# Patient Record
Sex: Female | Born: 1955 | Race: Black or African American | Hispanic: No | Marital: Single | Smoking: Current every day smoker
Health system: Southern US, Community
[De-identification: ages and names within clinical notes are randomized; demographics above are authoritative.]

## PROBLEM LIST (undated history)

## (undated) DIAGNOSIS — R3915 Urgency of urination: Secondary | ICD-10-CM

## (undated) DIAGNOSIS — M2540 Effusion, unspecified joint: Secondary | ICD-10-CM

## (undated) DIAGNOSIS — R531 Weakness: Secondary | ICD-10-CM

## (undated) DIAGNOSIS — J45909 Unspecified asthma, uncomplicated: Secondary | ICD-10-CM

## (undated) DIAGNOSIS — IMO0001 Reserved for inherently not codable concepts without codable children: Secondary | ICD-10-CM

## (undated) DIAGNOSIS — J189 Pneumonia, unspecified organism: Secondary | ICD-10-CM

## (undated) DIAGNOSIS — D649 Anemia, unspecified: Secondary | ICD-10-CM

## (undated) DIAGNOSIS — J449 Chronic obstructive pulmonary disease, unspecified: Secondary | ICD-10-CM

## (undated) DIAGNOSIS — M2550 Pain in unspecified joint: Secondary | ICD-10-CM

## (undated) DIAGNOSIS — R600 Localized edema: Secondary | ICD-10-CM

## (undated) DIAGNOSIS — R350 Frequency of micturition: Secondary | ICD-10-CM

## (undated) DIAGNOSIS — Z860100 Personal history of colon polyps, unspecified: Secondary | ICD-10-CM

## (undated) DIAGNOSIS — K219 Gastro-esophageal reflux disease without esophagitis: Secondary | ICD-10-CM

## (undated) DIAGNOSIS — D473 Essential (hemorrhagic) thrombocythemia: Secondary | ICD-10-CM

## (undated) DIAGNOSIS — M542 Cervicalgia: Secondary | ICD-10-CM

## (undated) DIAGNOSIS — I509 Heart failure, unspecified: Secondary | ICD-10-CM

## (undated) DIAGNOSIS — R918 Other nonspecific abnormal finding of lung field: Secondary | ICD-10-CM

## (undated) DIAGNOSIS — F32A Depression, unspecified: Secondary | ICD-10-CM

## (undated) DIAGNOSIS — D509 Iron deficiency anemia, unspecified: Secondary | ICD-10-CM

## (undated) DIAGNOSIS — D72829 Elevated white blood cell count, unspecified: Secondary | ICD-10-CM

## (undated) DIAGNOSIS — R609 Edema, unspecified: Secondary | ICD-10-CM

## (undated) DIAGNOSIS — F329 Major depressive disorder, single episode, unspecified: Secondary | ICD-10-CM

## (undated) DIAGNOSIS — I10 Essential (primary) hypertension: Secondary | ICD-10-CM

## (undated) HISTORY — DX: Elevated white blood cell count, unspecified: D72.829

## (undated) HISTORY — DX: Essential (hemorrhagic) thrombocythemia: D47.3

## (undated) HISTORY — DX: Iron deficiency anemia, unspecified: D50.9

## (undated) HISTORY — DX: Anemia, unspecified: D64.9

## (undated) HISTORY — PX: TONSILLECTOMY: SUR1361

## (undated) HISTORY — DX: Depression, unspecified: F32.A

## (undated) HISTORY — PX: TOTAL KNEE ARTHROPLASTY: SHX125

## (undated) HISTORY — DX: Chronic obstructive pulmonary disease, unspecified: J44.9

## (undated) HISTORY — PX: COLONOSCOPY: SHX174

## (undated) HISTORY — DX: Essential (primary) hypertension: I10

## (undated) HISTORY — DX: Gastro-esophageal reflux disease without esophagitis: K21.9

## (undated) HISTORY — DX: Other nonspecific abnormal finding of lung field: R91.8

## (undated) HISTORY — DX: Major depressive disorder, single episode, unspecified: F32.9

---

## 1998-12-23 ENCOUNTER — Other Ambulatory Visit: Admission: RE | Admit: 1998-12-23 | Discharge: 1998-12-23 | Payer: Self-pay | Admitting: Family Medicine

## 2001-01-03 ENCOUNTER — Ambulatory Visit (HOSPITAL_COMMUNITY): Admission: RE | Admit: 2001-01-03 | Discharge: 2001-01-03 | Payer: Self-pay | Admitting: Family Medicine

## 2001-01-03 ENCOUNTER — Encounter: Payer: Self-pay | Admitting: Family Medicine

## 2001-05-18 ENCOUNTER — Emergency Department (HOSPITAL_COMMUNITY): Admission: EM | Admit: 2001-05-18 | Discharge: 2001-05-18 | Payer: Self-pay | Admitting: Emergency Medicine

## 2001-06-11 ENCOUNTER — Encounter: Admission: RE | Admit: 2001-06-11 | Discharge: 2001-09-09 | Payer: Self-pay | Admitting: Family Medicine

## 2002-01-05 ENCOUNTER — Encounter: Payer: Self-pay | Admitting: Family Medicine

## 2002-01-05 ENCOUNTER — Ambulatory Visit (HOSPITAL_COMMUNITY): Admission: RE | Admit: 2002-01-05 | Discharge: 2002-01-05 | Payer: Self-pay | Admitting: Family Medicine

## 2003-01-19 ENCOUNTER — Ambulatory Visit (HOSPITAL_COMMUNITY): Admission: RE | Admit: 2003-01-19 | Discharge: 2003-01-19 | Payer: Self-pay | Admitting: Family Medicine

## 2003-06-30 ENCOUNTER — Ambulatory Visit (HOSPITAL_COMMUNITY): Admission: RE | Admit: 2003-06-30 | Discharge: 2003-06-30 | Payer: Self-pay | Admitting: Family Medicine

## 2003-12-03 ENCOUNTER — Ambulatory Visit (HOSPITAL_COMMUNITY): Admission: RE | Admit: 2003-12-03 | Discharge: 2003-12-03 | Payer: Self-pay | Admitting: Orthopedic Surgery

## 2003-12-20 ENCOUNTER — Encounter (HOSPITAL_COMMUNITY): Admission: RE | Admit: 2003-12-20 | Discharge: 2004-01-19 | Payer: Self-pay | Admitting: Orthopedic Surgery

## 2004-01-04 ENCOUNTER — Ambulatory Visit (HOSPITAL_COMMUNITY): Admission: RE | Admit: 2004-01-04 | Discharge: 2004-01-04 | Payer: Self-pay | Admitting: Orthopedic Surgery

## 2004-01-27 ENCOUNTER — Encounter (HOSPITAL_COMMUNITY): Admission: RE | Admit: 2004-01-27 | Discharge: 2004-02-26 | Payer: Self-pay | Admitting: Orthopedic Surgery

## 2004-04-14 ENCOUNTER — Inpatient Hospital Stay (HOSPITAL_COMMUNITY): Admission: EM | Admit: 2004-04-14 | Discharge: 2004-04-17 | Payer: Self-pay | Admitting: Emergency Medicine

## 2005-07-09 ENCOUNTER — Encounter: Admission: RE | Admit: 2005-07-09 | Discharge: 2005-07-09 | Payer: Self-pay | Admitting: Orthopedic Surgery

## 2005-11-01 ENCOUNTER — Encounter: Admission: RE | Admit: 2005-11-01 | Discharge: 2005-11-01 | Payer: Self-pay | Admitting: Orthopedic Surgery

## 2006-01-01 ENCOUNTER — Inpatient Hospital Stay (HOSPITAL_COMMUNITY): Admission: RE | Admit: 2006-01-01 | Discharge: 2006-01-05 | Payer: Self-pay | Admitting: Orthopedic Surgery

## 2006-01-02 ENCOUNTER — Ambulatory Visit: Payer: Self-pay | Admitting: Physical Medicine & Rehabilitation

## 2006-02-04 ENCOUNTER — Encounter (HOSPITAL_COMMUNITY): Admission: RE | Admit: 2006-02-04 | Discharge: 2006-03-06 | Payer: Self-pay | Admitting: Orthopedic Surgery

## 2006-04-16 ENCOUNTER — Inpatient Hospital Stay (HOSPITAL_COMMUNITY): Admission: RE | Admit: 2006-04-16 | Discharge: 2006-04-19 | Payer: Self-pay | Admitting: Orthopedic Surgery

## 2006-05-27 ENCOUNTER — Encounter (HOSPITAL_COMMUNITY): Admission: RE | Admit: 2006-05-27 | Discharge: 2006-06-26 | Payer: Self-pay | Admitting: Orthopedic Surgery

## 2006-06-28 ENCOUNTER — Encounter (HOSPITAL_COMMUNITY): Admission: RE | Admit: 2006-06-28 | Discharge: 2006-07-28 | Payer: Self-pay | Admitting: Orthopedic Surgery

## 2008-12-07 ENCOUNTER — Inpatient Hospital Stay (HOSPITAL_COMMUNITY): Admission: EM | Admit: 2008-12-07 | Discharge: 2008-12-13 | Payer: Self-pay | Admitting: Emergency Medicine

## 2008-12-08 ENCOUNTER — Encounter (INDEPENDENT_AMBULATORY_CARE_PROVIDER_SITE_OTHER): Payer: Self-pay | Admitting: General Surgery

## 2009-12-07 ENCOUNTER — Encounter: Payer: Self-pay | Admitting: Gastroenterology

## 2009-12-20 ENCOUNTER — Ambulatory Visit: Payer: Self-pay | Admitting: Gastroenterology

## 2009-12-20 ENCOUNTER — Ambulatory Visit (HOSPITAL_COMMUNITY)
Admission: RE | Admit: 2009-12-20 | Discharge: 2009-12-20 | Payer: Self-pay | Source: Home / Self Care | Admitting: Gastroenterology

## 2010-04-11 NOTE — Letter (Signed)
Summary: TRIAGE ORDER  TRIAGE ORDER   Imported By: Ave Filter 12/07/2009 10:15:28  _____________________________________________________________________  External Attachment:    Type:   Image     Comment:   External Document

## 2010-05-24 LAB — GLUCOSE, CAPILLARY: Glucose-Capillary: 110 mg/dL — ABNORMAL HIGH (ref 70–99)

## 2010-06-15 LAB — CBC
HCT: 32.6 % — ABNORMAL LOW (ref 36.0–46.0)
Hemoglobin: 10.5 g/dL — ABNORMAL LOW (ref 12.0–15.0)
MCHC: 32.1 g/dL (ref 30.0–36.0)
MCV: 83.4 fL (ref 78.0–100.0)
Platelets: 544 10*3/uL — ABNORMAL HIGH (ref 150–400)
RBC: 3.92 MIL/uL (ref 3.87–5.11)
RDW: 19.1 % — ABNORMAL HIGH (ref 11.5–15.5)
WBC: 8.6 10*3/uL (ref 4.0–10.5)

## 2010-06-15 LAB — GLUCOSE, CAPILLARY
Glucose-Capillary: 107 mg/dL — ABNORMAL HIGH (ref 70–99)
Glucose-Capillary: 109 mg/dL — ABNORMAL HIGH (ref 70–99)
Glucose-Capillary: 112 mg/dL — ABNORMAL HIGH (ref 70–99)
Glucose-Capillary: 118 mg/dL — ABNORMAL HIGH (ref 70–99)
Glucose-Capillary: 118 mg/dL — ABNORMAL HIGH (ref 70–99)
Glucose-Capillary: 124 mg/dL — ABNORMAL HIGH (ref 70–99)
Glucose-Capillary: 125 mg/dL — ABNORMAL HIGH (ref 70–99)
Glucose-Capillary: 135 mg/dL — ABNORMAL HIGH (ref 70–99)
Glucose-Capillary: 151 mg/dL — ABNORMAL HIGH (ref 70–99)
Glucose-Capillary: 154 mg/dL — ABNORMAL HIGH (ref 70–99)
Glucose-Capillary: 170 mg/dL — ABNORMAL HIGH (ref 70–99)
Glucose-Capillary: 77 mg/dL (ref 70–99)
Glucose-Capillary: 85 mg/dL (ref 70–99)
Glucose-Capillary: 98 mg/dL (ref 70–99)

## 2010-06-15 LAB — DIFFERENTIAL
Basophils Absolute: 0 10*3/uL (ref 0.0–0.1)
Basophils Relative: 0 % (ref 0–1)
Eosinophils Absolute: 0.1 10*3/uL (ref 0.0–0.7)
Eosinophils Relative: 2 % (ref 0–5)
Lymphocytes Relative: 29 % (ref 12–46)
Lymphs Abs: 2.5 10*3/uL (ref 0.7–4.0)
Monocytes Absolute: 0.4 10*3/uL (ref 0.1–1.0)
Monocytes Relative: 5 % (ref 3–12)
Neutro Abs: 5.5 10*3/uL (ref 1.7–7.7)
Neutrophils Relative %: 64 % (ref 43–77)

## 2010-06-15 LAB — BASIC METABOLIC PANEL
BUN: 2 mg/dL — ABNORMAL LOW (ref 6–23)
CO2: 33 mEq/L — ABNORMAL HIGH (ref 19–32)
Calcium: 8.3 mg/dL — ABNORMAL LOW (ref 8.4–10.5)
Chloride: 98 mEq/L (ref 96–112)
Creatinine, Ser: 0.77 mg/dL (ref 0.4–1.2)
GFR calc Af Amer: >60 mL/min (ref >60)
GFR calc non Af Amer: >60 mL/min (ref >60)
Glucose, Bld: 125 mg/dL — ABNORMAL HIGH (ref 70–99)
Potassium: 4.4 mEq/L (ref 3.5–5.1)
Sodium: 137 mEq/L (ref 135–145)

## 2010-06-15 LAB — VANCOMYCIN, TROUGH: Vancomycin Tr: 20.3 ug/mL — ABNORMAL HIGH (ref 10.0–20.0)

## 2010-06-16 LAB — DIFFERENTIAL
Basophils Absolute: 0 K/uL (ref 0.0–0.1)
Basophils Relative: 0 % (ref 0–1)
Eosinophils Absolute: 0 K/uL (ref 0.0–0.7)
Eosinophils Relative: 1 % (ref 0–5)
Lymphocytes Relative: 16 % (ref 12–46)
Lymphs Abs: 1.6 K/uL (ref 0.7–4.0)
Monocytes Absolute: 0.5 K/uL (ref 0.1–1.0)
Monocytes Relative: 5 % (ref 3–12)
Neutro Abs: 7.8 K/uL — ABNORMAL HIGH (ref 1.7–7.7)
Neutrophils Relative %: 78 % — ABNORMAL HIGH (ref 43–77)

## 2010-06-16 LAB — VANCOMYCIN, TROUGH: Vancomycin Tr: 29.9 ug/mL (ref 10.0–20.0)

## 2010-06-16 LAB — BASIC METABOLIC PANEL
BUN: 4 mg/dL — ABNORMAL LOW (ref 6–23)
CO2: 30 mEq/L (ref 19–32)
Calcium: 7.8 mg/dL — ABNORMAL LOW (ref 8.4–10.5)
Chloride: 99 mEq/L (ref 96–112)
Creatinine, Ser: 0.73 mg/dL (ref 0.4–1.2)
GFR calc Af Amer: >60 mL/min (ref >60)
GFR calc non Af Amer: >60 mL/min (ref >60)
Glucose, Bld: 133 mg/dL — ABNORMAL HIGH (ref 70–99)
Potassium: 4 mEq/L (ref 3.5–5.1)
Sodium: 137 mEq/L (ref 135–145)

## 2010-06-16 LAB — GLUCOSE, CAPILLARY
Glucose-Capillary: 107 mg/dL — ABNORMAL HIGH (ref 70–99)
Glucose-Capillary: 111 mg/dL — ABNORMAL HIGH (ref 70–99)
Glucose-Capillary: 115 mg/dL — ABNORMAL HIGH (ref 70–99)
Glucose-Capillary: 128 mg/dL — ABNORMAL HIGH (ref 70–99)
Glucose-Capillary: 144 mg/dL — ABNORMAL HIGH (ref 70–99)
Glucose-Capillary: 148 mg/dL — ABNORMAL HIGH (ref 70–99)
Glucose-Capillary: 152 mg/dL — ABNORMAL HIGH (ref 70–99)
Glucose-Capillary: 165 mg/dL — ABNORMAL HIGH (ref 70–99)
Glucose-Capillary: 276 mg/dL — ABNORMAL HIGH (ref 70–99)
Glucose-Capillary: 81 mg/dL (ref 70–99)

## 2010-06-16 LAB — BASIC METABOLIC PANEL WITH GFR
BUN: 4 mg/dL — ABNORMAL LOW (ref 6–23)
CO2: 30 meq/L (ref 19–32)
Calcium: 8 mg/dL — ABNORMAL LOW (ref 8.4–10.5)
Chloride: 93 meq/L — ABNORMAL LOW (ref 96–112)
Creatinine, Ser: 0.75 mg/dL (ref 0.4–1.2)
GFR calc Af Amer: >60 mL/min (ref >60)
GFR calc non Af Amer: >60 mL/min (ref >60)
Glucose, Bld: 217 mg/dL — ABNORMAL HIGH (ref 70–99)
Potassium: 3.6 meq/L (ref 3.5–5.1)
Sodium: 131 meq/L — ABNORMAL LOW (ref 135–145)

## 2010-06-16 LAB — CULTURE, ROUTINE-ABSCESS

## 2010-06-16 LAB — CBC
HCT: 33 % — ABNORMAL LOW (ref 36.0–46.0)
HCT: 37.3 % (ref 36.0–46.0)
Hemoglobin: 10.8 g/dL — ABNORMAL LOW (ref 12.0–15.0)
Hemoglobin: 12.2 g/dL (ref 12.0–15.0)
MCHC: 32.6 g/dL (ref 30.0–36.0)
MCHC: 32.7 g/dL (ref 30.0–36.0)
MCV: 82.2 fL (ref 78.0–100.0)
MCV: 82.6 fL (ref 78.0–100.0)
Platelets: 488 10*3/uL — ABNORMAL HIGH (ref 150–400)
Platelets: 494 10*3/uL — ABNORMAL HIGH (ref 150–400)
RBC: 4 MIL/uL (ref 3.87–5.11)
RBC: 4.54 MIL/uL (ref 3.87–5.11)
RDW: 18.7 % — ABNORMAL HIGH (ref 11.5–15.5)
RDW: 18.9 % — ABNORMAL HIGH (ref 11.5–15.5)
WBC: 10 10*3/uL (ref 4.0–10.5)
WBC: 11.1 10*3/uL — ABNORMAL HIGH (ref 4.0–10.5)

## 2010-06-16 LAB — ANAEROBIC CULTURE

## 2010-07-28 NOTE — Discharge Summary (Signed)
[name], Debbie Mullins            ACCOUNT NO.:  [id number]   MEDICAL RECORD NO.:  [id number]          PATIENT TYPE:  INP   LOCATION:  5014                         FACILITY:  [facility]   PHYSICIAN:  Myrtie Neither, MD      DATE OF BIRTH:  [birth year]   DATE OF ADMISSION:  04/16/2006  DATE OF DISCHARGE:  04/19/2006                               DISCHARGE SUMMARY   ADMISSION DIAGNOSES:  1. Degenerative arthropathy left knee.  2. History of obesity.  3. High blood pressure.  4. Type 2 diabetes.   DISCHARGE DIAGNOSES:  1. Degenerative arthropathy left knee.  2. History of obesity.  3. High blood pressure.  4. Type 2 diabetes.   COMPLICATIONS:  None.   INFECTIONS:  None.   OPERATION:  Left total knee arthroplasty done on April 16, 2006.   PERTINENT HISTORY:  This is a 55 year old-female followed for bilateral  degenerative arthropathy of her knees.  The patient had previous right  total knee arthropathy and total knee replacement.  The patient returned  presently for left total knee replacement.   PERTINENT PHYSICAL EXAMINATION:  Left knee:  Genu valgum.  Crepitus  medial and laterally in the patellofemoral joint, +2 effusion.  Range of  motion good.  Some mild anterior instability.  Negative Homans' test.  X-  rays revealed loss of lateral compartment and patellofemoral joint line.   HOSPITAL COURSE:  The patient had preop laboratory done:  CBC, EKG,  chest x-ray, CMET, urinalysis, PT, PTT, and platelet count.  The  patient's laboratory were stable enough for patient to undergo surgery.  The patient underwent left total knee arthroplasty and tolerated the  procedure quite well postoperatively.  The patient had pre and postop IV  antibiotics, Coumadin prophylaxis, CPM, use of occupational and physical  therapy.  The patient  progressed quite well with partial weightbearing on the left side and  pain was brought under control and was able to be discharged on  Coumadin, Percocet  for pain, partial weightbearing.  Continue home  health and physical therapy and return to the office in 1 week.  The  patient was discharged in stable and satisfactory condition.      Myrtie Neither, MD  Electronically Signed     AC/MEDQ  D:  06/19/2006  T:  06/20/2006  Job:  [id number]

## 2010-07-28 NOTE — Discharge Summary (Signed)
Debbie Mullins, Debbie Mullins            ACCOUNT NO.:  [id number]   MEDICAL RECORD NO.:  [id number]          PATIENT TYPE:  INP   LOCATION:  A302                          FACILITY:  [facility]   PHYSICIAN:  Vania Rea, M.D. DATE OF BIRTH:  1956/01/26   DATE OF ADMISSION:  04/14/2004  DATE OF DISCHARGE:  02/06/2006LH                                 DISCHARGE SUMMARY   PRIMARY CARE PHYSICIAN:  Renaye Rakers M.D.   DISCHARGE DIAGNOSES:  1.  Bilobar pneumonia, much improved.  2.  Diabetes, type 2.  3.  Hypertension.  4.  Tobacco abuse.  5.  Mild chronic obstructive pulmonary disease.  6.  Morbid obesity.  7.  Osteoarthritis, both knees.  8.  Acute renal failure, resolved.   DISPOSITION:  Discharge to home.   CONDITION ON DISCHARGE:  Stable.   DISCHARGE MEDICATIONS:  1.  Levaquin 750 mg daily for five days.  2.  Glucophage 100 mg daily.  3.  Avandamet 04/998 mg daily.  4.  Diovan/HCT 160/12.5 daily.  5.  Lasix 20 mg daily.  6.  Robitussin 15 mL three times daily.  7.  Nicotine patch 21 mg per day.  8.  Combivent inhaler two puffs twice a day.   HOSPITAL COURSE:  Please refer to admission history and physical of February  3.  This was a 55 year old, morbidly obese African American smoker and  diabetic who presents with a three-day history of fever, progressive cough,  and chest pain who when seen in the emergency room was found to have  leukocytosis,renal insuffiency,  and left upper and left lower lobe  infiltrate on chest x-ray.  The patient was admitted for treatment of  bilobar pneumonia received intravenous antibiotics, Rocephin and Zithromax.  Was continued on her antidiabetic medications but required sliding scale  coverage.  Her diuretics were held because she was somewhat dehydrated and  renal insufficient because of a fever and poor oral intake and over the  succeeding few days, the patient's got progressively better.   PHYSICAL EXAMINATION:  GENERAL:  Today the patient  is alert and oriented.  She is having no significant distress.  VITAL SIGNS: This morning her vital signs showed temperature 97.8, pulse 71,  respirations 20, blood pressure 122/71.  She is saturating at 92% on room  air.  CHEST:  She has occasional rhonchi in the left chest but good air entry.  CARDIOVASCULAR:  Regular rhythm.  ABDOMEN:  Obese, soft, nontender.  EXTREMITIES:  Without edema.   LABORATORY DATA:  White count 11.2.  It was 15.5 on admission.  Hemoglobin  is stable at 11.2 and her platelets are stable at 465  Her serum chemistries  are essentially normal.  She has potassium of 4.4.  Creatinine 0.8.  Her  glucose on her chem 7 is 101.   FOLLOW UP:  The patient is to follow up with her primary care physician  within a week.  She has indicated that she wishes to stop smoking and she  has been recommended to a support group in addition to being supplied with a  prescription for nicotine patches.  LC/MEDQ  D:  04/18/2004  T:  04/18/2004  Job:  [id number]

## 2010-07-28 NOTE — Discharge Summary (Signed)
NAMEWILENE, Debbie Mullins            ACCOUNT NO.:  [id number]   MEDICAL RECORD NO.:  [id number]          PATIENT TYPE:  INP   LOCATION:  5010                         FACILITY:  [facility]   PHYSICIAN:  Myrtie Neither, MD      DATE OF BIRTH:  01-04-1956   DATE OF ADMISSION:  01/01/2006  DATE OF DISCHARGE:  01/05/2006                               DISCHARGE SUMMARY   ADMISSION DIAGNOSIS:  Degenerative arthritis, left knee.   DISCHARGE DIAGNOSIS:  Degenerative arthritis, left knee.   COMPLICATIONS:  None.   INFECTIONS:  None.   OPERATION:  Left total knee arthroplasty, Biomed implant.   PERTINENT HISTORY:  This is a 56 year old female who has been followed  in the office for severe degenerative arthropathy involving the left  knee.  The patient had been treated with antiinflammatories, use of a  cane, and therapeutic injections.  The patient's condition had  progressively gotten worse over the past few months.   PERTINENT PHYSICAL:  Examination of the left knee showed to be a valgus  deformity, +2 effusion, crepitus both medial, lateral, and  patellofemoral components.  Range of motion:  Full extension of the leg,  full flexion.  The patient had gross instability of the left knee.  Negative Homans test.  Neurovascular status intact.   HOSPITAL COURSE:  The patient underwent preop laboratory CBC, EKG, chest  x-ray, CMET, UA.  The patient's laboratory studies were found to be  stable enough to undergo surgery.  The patient had a left total knee  arthroplasty done on January 01, 2006.  She tolerated the procedure  quite well.   Postop course:  The patient received pre- and postop IV antibiotics, use  of CPM, PT, ACE, occupational therapy.  The patient's pain was brought  under control.  H and H remained stable.  Afebrile.  Wound was healing  quite well.  Swelling had subsided.  The patient was partial  weightbearing on the left side and the patient progressed well enough to  be  discharged afebrile with stable H and H and had home health and PT  scheduled as an outpatient.  The patient did continue on Coumadin  therapy with INRs being checked by home health.   The patient was discharged in stable and satisfactory condition to  return to the office in 1 week.  The patient was discharged on Percocet  1 to 2 q.4 p.r.n. for pain, Feosol 300 1 b.i.d., Coumadin, and INR.      Myrtie Neither, MD  Electronically Signed     AC/MEDQ  D:  01/29/2006  T:  01/30/2006  Job:  [id number]

## 2010-07-28 NOTE — H&P (Signed)
Debbie Mullins, Debbie Mullins            ACCOUNT NO.:  [id number]   MEDICAL RECORD NO.:  [id number]          PATIENT TYPE:  OIB   LOCATION:  2899                         FACILITY:  [facility]   PHYSICIAN:  Myrtie Neither, M.D.    DATE OF BIRTH:  1955-09-20   DATE OF ADMISSION:  12/03/2003  DATE OF DISCHARGE:  12/03/2003                                HISTORY & PHYSICAL   CHIEF COMPLAINT:  Painful right knee.   HISTORY OF PRESENT ILLNESS:  This is a 55 year old female who has been  having severe pain, swelling, locking of the right knee.  The patient had a  similar problem to the left knee but the right knee is worse than the left.  The patient has been treated with anti-inflammatories and warm compresses.  The patient's pain has progressively worsened and is not responding very  much to medications.   PAST MEDICAL HISTORY:  1.  Diabetes mellitus.  2.  High blood pressure.  3.  Bronchitis.  4.  Degenerative arthritis.   SOCIAL HISTORY:  The patient has occasional use of alcohol.  Smokes one and  a half packs of cigarettes per day.   ALLERGIES:  None known.   MEDICATIONS:  Avandia, Glucophage, Lasix 20 mg, Diovan, and Lodine 400  b.i.d.   REVIEW OF SYSTEMS:  Occasional recurrent cough, bronchitis, some shortness  of breath on exertion, no cardiac or urinary or bowel symptoms.   FAMILY HISTORY:  Noncontributory, history of diabetes mellitus and high  blood pressure.   PHYSICAL EXAMINATION:  VITAL SIGNS:  Temperature 98, pulse 102, respirations  20, blood pressure 129/58, height 63 inches, weight 291.  HEENT:  Head normocephalic.  Eyes, conjunctivae and sclerae are clear.  NECK:  Supple.  CHEST:  Clear.  CARDIAC:  S1 S2 regular.  EXTREMITIES:  Bilateral genu valgum.  Right knee +2 effusion.  Crepitus  medial and lateral compartment.  Positive McMurray test.  Neurovascular  status is intact.  Tender medial and lateral compartment.  Left knee genu  valgum.  Patella, femoral tenderness  with crepitus medial and lateral  compartment.  Effusion.  No increased warmth.  Negative Homan test.   IMPRESSION:  1.  Internal derangement, right knee.  2.  Synovitis, right knee.  3.  Degenerative joint disease, right knee.   PLAN:  Arthroscopic debridement, right knee.       AC/MEDQ  D:  12/03/2003  T:  12/04/2003  Job:  [id number]

## 2010-07-28 NOTE — Op Note (Signed)
NAMEANALAYA, HOEY NO.:  [id number]   MEDICAL RECORD NO.:  [id number]          PATIENT TYPE:  INP   LOCATION:  2899                         FACILITY:  [facility]   PHYSICIAN:  Myrtie Neither, MD      DATE OF BIRTH:  11-Mar-1956   DATE OF PROCEDURE:  01/01/2006  DATE OF DISCHARGE:                                 OPERATIVE REPORT   PREOPERATIVE DIAGNOSIS:  Degenerative arthritis left knee.   POSTOPERATIVE DIAGNOSIS:  Degenerative arthritis left knee.   ANESTHESIA:  General.   PROCEDURE:  Left total knee arthroplasty, Bi[employer] implant.   The patient was taken to the operating room and was given adequate preop  medications, given general anesthesia and intubated.  Left knee was prepped  with DuraPrep and draped in a sterile manner.  Tourniquet and Bovie used for  hemostasis.  Anterior midline was made over the left knee going through the  skin and subcutaneous tissue extending from the tibial tuberosity up to the  quadriceps.  A sharp and blunt dissection made both medial and laterally. A  medial paramedian incision made into the capsule extending from the tibial  tuberosity up to the quadriceps.  Patella was reflected laterally.  Knee was  taken up into flexed position.  Osteophytes about the femur, tibia and the  patella were resected.  Soft tissue resection was done.  With the knee in  flexed position, 10 mm tibial plateau surface was resected, followed by  reaming down the femoral canal.  Distal femoral cutting jig was put in place  followed by sizing of the implant which was 60 mm.  A 60 mm implant cutting  guide was put in place.  Anterior and posterior cuts and chamfer cuts were  done.  Loose bodies were removed from the joint.  Trial component was found  to fit very snug.  Next, tibial trial was found to be 67 mm, alignment rod  and appropriate cutting jig was put in place and appropriate cuts were made.  Tibial and femoral trials were put in place, full  extension and full  flexion, good medial and lateral stability, 12 mm poly was found to be most  stable.  Sizing of the patella was done which was medial and 34 mm,  appropriate cutting jig was put in place and cuts were made.  With all three  components put in place, patient's knee was taken to full flexion, full  extension, no telescoping, no subluxation of the patella, good medial and  lateral stability.  Next, the wound was irrigated.  Methyl methacrylate was  mixed and the patella and the tibial components were cemented.  Femoral  component was press fitted.  After excess methyl methacrylate was removed,  trial poly was still used at 12 mm.  Tourniquet was then let down.  Hemostasis obtained.  After the obtaining of hemostasis, final poly 12 mm  was put in place and locked in place with a key.  Again, range of motion  demonstrated full flexion, full extension, good medial and lateral stability  and no subluxation of the patella.  Wound closure was then  done with 0  Vicryl for the fascia, 2-0 for subcutaneous and skin staples for the skin.  Bulky compressive dressing was applied.  Knee immobilizer applied.  The  patient tolerated the procedure quite well.  The patient had previously had  a femoral block done.  The patient went to the recovery room in stable and  satisfactory condition.      Myrtie Neither, MD  Electronically Signed    AC/MEDQ  D:  01/01/2006  T:  01/02/2006  Job:  [id number]

## 2010-07-28 NOTE — H&P (Signed)
NAMESABENA, Debbie Mullins            ACCOUNT NO.:  [id number]   MEDICAL RECORD NO.:  [id number]          PATIENT TYPE:  OIB   LOCATION:  2899                         FACILITY:  [facility]   PHYSICIAN:  Myrtie Neither, MD      DATE OF BIRTH:  09-23-1955   DATE OF ADMISSION:  01/04/2004  DATE OF DISCHARGE:                                HISTORY & PHYSICAL   CHIEF COMPLAINT:  Painful left knee with swelling.   HISTORY OF PRESENT ILLNESS:  This is a 55 year old female who had been  followed in the office for internal derangement of bilateral knees with  recurrent pain and swelling of both knees and locking.  The patient had  previous arthroscopic of the right knee and returns to have the same  procedure done to the left.   PAST MEDICAL HISTORY:  1.  Arthroscopy, right knee.  2.  History of high blood pressure.   ALLERGIES:  None known.   MEDICATIONS:  1.  Lodine 400 b.i.d.  2.  Lasix 20 mg daily.  3.  Percocet q.6h. p.r.n.  4.  Metformin 1000 mg daily.  5.  Diovan/hydrochlorothiazide.  6.  Avandia 4 mg daily.   The patient also has history of diabetes mellitus.   SOCIAL HISTORY:  Occasional use of alcohol.  The patient smokes two packs  per day.   FAMILY HISTORY:  Noncontributory.   REVIEW OF SYSTEMS:  Recurrent cough, some shortness of breath with exertion,  no chest pain.   PHYSICAL EXAMINATION:  GENERAL:  Alert and oriented, no acute distress.  VITAL SIGNS:  Temperature 97.3, pulse 88, respirations 20, blood pressure  120/75, weight 290.  HEENT:  Normocephalic.  Eyes:  Conjunctivae and sclerae clear.  NECK:  Supple.  CHEST:  Clear.  CARDIAC:  S1, S2, regular.  EXTREMITIES:  Bilateral genu valgum, left knee tender both medial and  lateral compartments, with positive McMurray's test.  Palpable and audible  click both lateral compartments with patellofemoral tenderness.  Negative  drawers, negative Lachman's test.   IMPRESSION:  1.  Internal derangement, left knee.  2.   Loose bodies, left knee.   PLAN:  Arthroscopy, left knee.      Arth   AC/MEDQ  D:  01/04/2004  T:  01/04/2004  Job:  [id number]

## 2010-07-28 NOTE — Op Note (Signed)
[name], Debbie Mullins            ACCOUNT NO.:  [id number]   MEDICAL RECORD NO.:  [id number]          PATIENT TYPE:  OIB   LOCATION:  2899                         FACILITY:  [facility]   PHYSICIAN:  Myrtie Neither, M.D.    DATE OF BIRTH:  October 09, 1955   DATE OF PROCEDURE:  DATE OF DISCHARGE:  12/03/2003                                 OPERATIVE REPORT   PREOPERATIVE DIAGNOSIS:  Internal derangement degenerative joint disease,  right knee.   POSTOPERATIVE DIAGNOSIS:  Internal derangement degenerative joint disease,  right knee.   ANESTHESIA:  General.   PROCEDURE:  Arthroscopic debridement, right knee with synovectomy, removal  of loose bodies, chondroplasty, lateral meniscectomy.   The patient was taken to the operating room and given adequate pre-op  medication, given general anesthesia and intubated.  Right knee was prepped  with DuraPrep and draped in a sterile manner.  Inspection of the joint  revealed hypertrophic synovium, loose bodies, degenerative tear of the  lateral meniscus, chondral defect involving that of femoral condyle and  tibial plateau surface.  Chondromalacia changes of the patella and  intercondylar notch.  Synovectomy was done both medial and lateral  compartment.  Debridement was with removal of loose bodies.  Lateral  meniscectomy was done and shaving of both medial and lateral femoral  condyles.  Further irrigation removed other loose fragments.  Further  inspection did not reveal any other loose fragments.  Joint itself debrided  quite well.  The patient tolerated the procedure quite well.  Wound was  closed with 4-0 Nylon, 15 cc of 0.5% Marcaine were injected into the joint,  compressive dressing was applied.  The patient tolerated the procedure quite  well in the recovery room in stable and satisfactory condition.   The patient is being discharged home on Percocet 1-2 q.4h. p.r.n. for pain.   Partial weightbearing on the right side with the use of crutches.   Ice  packs, elevation, and return to the office in one week.  The patient is  being discharged in stable and satisfactory condition.       AC/MEDQ  D:  12/03/2003  T:  12/04/2003  Job:  [id number]

## 2010-07-28 NOTE — Op Note (Signed)
NAMEHILLARI, Debbie Mullins            ACCOUNT NO.:  [id number]   MEDICAL RECORD NO.:  [id number]          PATIENT TYPE:  OIB   LOCATION:  2899                         FACILITY:  [facility]   PHYSICIAN:  Myrtie Neither, MD      DATE OF BIRTH:  03-18-1955   DATE OF PROCEDURE:  01/04/2004  DATE OF DISCHARGE:                                 OPERATIVE REPORT   PREOPERATIVE DIAGNOSIS:  Internal derangement, left knee.   POSTOPERATIVE DIAGNOSIS:  1.  Multiple loose bodies, lateral compartment and lateral gutter.  2.  Lateral meniscal tear.  3.  Chronic synovitis medial and lateral compartment, with chondral defects.   PROCEDURE:  Arthroscopic complete synovectomy, removal of loose bodies,  lateral meniscectomy, and limited chondroplasty medial femoral condyle.   The patient was taken to the operating room after given adequate  preoperative medication, given general anesthesia and intubated.  The left  knee was prepped with DuraPrep and draped in a sterile manner, a tourniquet  used for hemostasis.  One-half-inch puncture wound made along the anterior  medial and lateral joint line. inflow of water going through the medial  suprapatellar pouch area.  Inspection of the joint revealed three large  ostial chondral fragments - one in the intercondylar notch, one in the  lateral compartment, and one in the lateral gutter; chronic synovitic  changes of the synovium both medial and lateral compartment; chondral  defects involving the medial femoral condyle; lateral meniscal tear along  the posterior horn and posterolateral aspect.  The medical meniscus was  intact.  With the synovial shaver, complete synovectomy was done of the  suprapatellar pouch, medial and lateral compartment.  Removal of loose  fragments was done with the use of rongeur and Kocher.  Lateral meniscectomy  was done with the basket forceps and the meniscal shaver.  Limited  chondroplasty was done about the chondral defect about the  medial femoral  condyle.  The ACL was intact and medial meniscus was intact.  Copious and  abundant irrigation was done.  Wound closure was then done with 4-0 nylon;  20 mL of 0.25% Marcaine plain was injected into the joint.  Compressive  dressing was applied, knee immobilizer applied.  The patient tolerated the  procedure quite well and went to recovery room in stable and satisfactory  condition.  The patient is being discharged home on Percocet one q.4h.  p.r.n. for pain, ice packs, elevation, partial weightbearing on the left  side with the use of walker.  The patient is being discharged in stable and  satisfactory condition and to return to the office in 1 week.      Arth   AC/MEDQ  D:  01/04/2004  T:  01/04/2004  Job:  [id number]

## 2010-07-28 NOTE — Group Therapy Note (Signed)
NAMEMONICKA, CYRAN            ACCOUNT NO.:  [id number]   MEDICAL RECORD NO.:  [id number]          PATIENT TYPE:  INP   LOCATION:  A302                          FACILITY:  [facility]   PHYSICIAN:  Margaretmary Dys, M.D.DATE OF BIRTH:  08-Dec-1955   DATE OF PROCEDURE:  04/16/2004  DATE OF DISCHARGE:                                   PROGRESS NOTE   SUBJECTIVE:  The patient feels a lot better.  Says her cough is less.  Has  no fever or chills.  Has no headache, dizziness or lightheadedness.  No  nausea, vomiting, diarrhea or abdominal pain.  She says her left-sided chest  pain is also better.   OBJECTIVE:  GENERAL APPEARANCE:  She is a lot comfortable and not in acute  distress.  The patient is sitting up in bed.  VITAL SIGNS:  Blood pressure is 127/63, pulse 69, respiratory rate 22,  temperature T max 98.5.  Oxygen sats were 93% on 2 L.  The patient weighed  286 pounds.  HEENT:  Normocephalic, atraumatic.  Oral mucosa were moist with no exudate.  NECK:  Supple.  No JVD.  No lymphadenopathy.  LUNGS:  Markedly reduced air entry bilaterally, probably due to body size.  She also had some rhonchi noticed on the left side.  HEART:  S1, S2 regular.  No S3, S4, gallops or rubs.  ABDOMEN:  Soft and nontender.  Bowel sounds positive.  EXTREMITIES:  She has chronic stasis dermatitis bilaterally.  CENTRAL NERVOUS SYSTEM:  Grossly intact.   LABORATORY/DIAGNOSTIC DATA:  White blood cell count is 11.6, hemoglobin  10.7, hematocrit 30.8, platelet count 406, neutrophils 62%.  Sodium is 130,  potassium 3.8, chloride 94, CO2 28, glucose 86, BUN 11, creatinine 0.9,  calcium 7.9.   ASSESSMENT/PLAN:  1.  Community-acquired pneumonia.  The patient is much better.  Temperature      is down.  White cell count is also improved.  Continue on ceftriaxone      and Zithromax.  2.  Diabetes mellitus, type 2.  Blood glucose remains in satisfactory range.   Continue followup.  Anticipate discharge in 2-3  days.      AM/MEDQ  D:  04/16/2004  T:  04/16/2004  Job:  [id number]

## 2010-07-28 NOTE — Op Note (Signed)
Debbie Mullins, Debbie Mullins            ACCOUNT NO.:  [id number]   MEDICAL RECORD NO.:  [id number]          PATIENT TYPE:  INP   LOCATION:  2550                         FACILITY:  [facility]   PHYSICIAN:  Myrtie Neither, MD      DATE OF BIRTH:  1955/06/18   DATE OF PROCEDURE:  04/16/2006  DATE OF DISCHARGE:                               OPERATIVE REPORT   PREOPERATIVE DIAGNOSIS:  Degenerative arthritis, right knee.   POSTOPERATIVE DIAGNOSIS:  Degenerative arthritis, right knee.   ANESTHESIA:  General.   PROCEDURE:  Right total knee arthroplasty, Biomet implant.   The patient was taken to the operating room after given adequate  preoperative medications and given general anesthesia and intubated.  The right knee was prepped with DuraPrep and draped in a sterile manner.   Tourniquet and Bovie used for hemostasis.  An anterior midline incision  made over the right knee going through the skin and subcutaneous tissue  down to the fascia.  Sharp and blunt dissection were made both medially  and laterally.  A medial paramedian incision was made into the capsule,  extending from the quadriceps down to the tibial tuberosity.  The  patella was reflected laterally.  The knee was taken up into a flexed  position.  Osteophytes about the patella, femur, and tibia were  resected.  Soft tissue resection was also done.  With the knee in the  flexed position, the tibial cutting jig was put in place, removing 10 mm  of tibial plateau surface.  Next, reaming was done down the femoral  canal, and the distal femoral cutting jig was put in place at 6 degrees  of valgus.  Sizing of the femur was done and sized to that of 65 mm.  A  65-mm cutting jig was done, and then anterior, posterior, and chamfer  cuts were made.  Other soft tissue removal was done.  The femoral  component was put in place, trial component, and was found to fit quite  snugly.  Next, attention was turned to the tibial plateau surface which  was found to be 67 mm with good coverage.  An appropriate cutting jig  was put in place, and appropriate proximal tibial cuts were made.  Next,  with the trial components, the femur and the tibial  components were put  in place. A 12-mm was found to allow full flexion, full extension, good  medial and lateral stability, and good coverage.  Patellar sizing was  done which was found to be a size large.  A 37-mm and appropriate  cutting jig was put in place and cut was made.  With all three trial  components for the femur, tibia, and patella, full range of motion, full  extension, full flexion, good medial lateral stability.  There was  subluxation of the patella.  Lateral release was done and lateral  subluxation was corrected.  Copious irrigation was then done.  Methyl  methacrylate was then mixed.  The patella and tibial components were  cemented.  The femoral component was press fitted.  After setting of the  cement and removal of excess methyl  methacrylate, the tibial plateau  polyethylene trial was put back in place, and again, 12 mm was found to  be the most stable.  Final poly was then put in place, 12-mm, and locked  in place.  Range of motion in full extension, full flexion, good medial  lateral stability.  No subluxation of the patella.  Copious irrigation  was done.  The tourniquet was let down, and hemostasis was obtained.  Wound closure was then done with the use of 0 Vicryl  for the fascia, 2-0 for the subcutaneous, and skin staples for the skin.  A bulky compressive dressing was applied, knee immobilizer applied.   The patient tolerated procedure quite well and went to the recovery room  in stable and satisfactory condition.      Myrtie Neither, MD  Electronically Signed     AC/MEDQ  D:  04/16/2006  T:  04/16/2006  Job:  [id number]

## 2010-07-28 NOTE — H&P (Signed)
NAMEWESTYN, KEATLEY            ACCOUNT NO.:  [id number]   MEDICAL RECORD NO.:  [id number]          PATIENT TYPE:  EMS   LOCATION:  ED                            FACILITY:  [facility]   PHYSICIAN:  Vania Rea, M.D. DATE OF BIRTH:  02-10-1956   DATE OF ADMISSION:  04/14/2004  DATE OF DISCHARGE:  LH                                HISTORY & PHYSICAL   PRIMARY CARE PHYSICIAN:  Renaye Rakers, M.D.   CHIEF COMPLAINT:  Fever and cough x3 days.   HISTORY OF PRESENT ILLNESS:  This is a 55 year old obese African-American  lady with a history of hypertension and diabetes, who was in baseline state  of health until three days ago when she developed fever with chills  associated with a cough and productive of clear sputum.  Patient, who is a  CNA, self-medicated with Tylenol and cough medicines without much relief,  then yesterday she started having left-sided chest pain with breathing with  continuing cough, fever, and chills.  She came to the emergency room today  where a chest x-ray revealed left upper and left lower lobe pneumonia.  Patient is admitted for management.   PAST MEDICAL HISTORY:  1.  Hypertension.  2.  Diabetes type 2.  3.  Chronic tobacco abuse.  4.  Morbid obesity.  5.  Osteoarthritis, both knees.  6.  Arthroscopic debridement, synovectomy, and chondroplasty of both knees      in September, 2005 and October, 2005.   MEDICATIONS:  1.  Glucophage 1000 mg daily.  2.  Avandamet 04/998 daily.  3.  Diovan/HCTZ 160/12.5 daily.  4.  Lasix 20 mg daily.   ALLERGIES:  No known drug allergies.   SOCIAL HISTORY:  She is a Lawyer.  She is single.  Lives with her mother.  Works at [employer].  She has smoked 1-1/2 to 2  packs of cigarettes per day for the past 20 years.  She consumes alcohol  occasionally.  Denies illegal drug use.   FAMILY HISTORY:  Significant for a father who died in the armed services at  age 79.  Mother living at age 57 with  diabetes, hypertension, and congestive  heart failure.  One brother and one sister.  The brother had an acute MI at  the age of 28.  The sister suffers with sinusitis.  She has no children.   REVIEW OF SYSTEMS:  On a 10-point review of systems, the only positive  finding was bilateral knee joint pains.   PHYSICAL EXAMINATION:  GENERAL:  This is a pleasant middle-aged African-  American lady lying in bed in obvious respiratory distress, coughing  repeatedly.  VITALS:  Temperature 98.7, pulse 97, respiration 20, blood pressure 116/69.  Her pain scale is described as an 8/10.  She is saturating at 93% on 2  liters.  HEENT:  Pupils are equal, round and reactive.  Mucous membranes are pink and  anicteric.  She is mildly dehydrated.  NECK:  She has no lymphadenopathy.  She has a thick neck.  CHEST:  She has rhonchi of the entire left side, anteriorly and posteriorly,  and wheezing in the right upper chest posteriorly.  CARDIOVASCULAR:  Regular rhythm.  ABDOMEN:  Morbid obesity but soft and nontender.  No masses.  Normal  abdominal bowel sounds.  EXTREMITIES:  She has edema of the right leg, compared to the left, which is  chronic.  She has 2+ pulses bilaterally.  CENTRAL NERVOUS SYSTEM:  She is alert and oriented x 3.  She has no focal  neurological deficits.   LABS:  White count 15.5, Her absolute neutrophil count is 11.2. hemoglobin  11.4, platelets 392.  Sodium 134, potassium 3.9, chloride 98, CO2 26,  glucose 117, BUN 16, creatinine 1.5, calcium 8.1.  ABG on room air:  Her pH  is 7.4, pCO2 40, pO2 61, saturating at 90%.   Chest x-ray shows a left upper and left lower lobe pneumonia.   ASSESSMENT:  1.  Left upper and left lower lobe pneumonia.  2.  Dehydration.  3.  Morbid obesity.  4.  Diabetes type 2.  5.  Hypertension, controlled.   PLAN:  We will admit this lady.  Continue treatment for pneumonia with  Zithromax and Rocephin.  Continue oral meds, despite Creatinine of 1.5.  We  will encourage oral hydration.  We will withhold intravenous hydration in  view of her history of diuretic use but will withhold diuretics for the time  being.  Will monitor renal function.  If creatinie is rising we may need to  withold Metformin. Will do blood cultures if she should exhibit a fever but  otherwise will do sputum cultures.      LC/MEDQ  D:  04/14/2004  T:  04/14/2004  Job:  [id number]

## 2011-01-02 ENCOUNTER — Encounter (HOSPITAL_COMMUNITY): Payer: Self-pay | Admitting: Oncology

## 2011-01-02 ENCOUNTER — Encounter (HOSPITAL_COMMUNITY): Payer: Self-pay | Attending: Oncology | Admitting: Oncology

## 2011-01-02 DIAGNOSIS — J4489 Other specified chronic obstructive pulmonary disease: Secondary | ICD-10-CM | POA: Insufficient documentation

## 2011-01-02 DIAGNOSIS — J45909 Unspecified asthma, uncomplicated: Secondary | ICD-10-CM

## 2011-01-02 DIAGNOSIS — J449 Chronic obstructive pulmonary disease, unspecified: Secondary | ICD-10-CM | POA: Insufficient documentation

## 2011-01-02 DIAGNOSIS — D649 Anemia, unspecified: Secondary | ICD-10-CM | POA: Insufficient documentation

## 2011-01-02 DIAGNOSIS — E119 Type 2 diabetes mellitus without complications: Secondary | ICD-10-CM | POA: Insufficient documentation

## 2011-01-02 DIAGNOSIS — D72829 Elevated white blood cell count, unspecified: Secondary | ICD-10-CM | POA: Insufficient documentation

## 2011-01-02 DIAGNOSIS — D473 Essential (hemorrhagic) thrombocythemia: Secondary | ICD-10-CM | POA: Insufficient documentation

## 2011-01-02 LAB — CBC
HCT: 37.2 % (ref 36.0–46.0)
Hemoglobin: 12 g/dL (ref 12.0–15.0)
MCH: 28.7 pg (ref 26.0–34.0)
MCHC: 32.3 g/dL (ref 30.0–36.0)
MCV: 89 fL (ref 78.0–100.0)
Platelets: 438 10*3/uL — ABNORMAL HIGH (ref 150–400)
RBC: 4.18 MIL/uL (ref 3.87–5.11)
RDW: 15.3 % (ref 11.5–15.5)
WBC: 15.5 10*3/uL — ABNORMAL HIGH (ref 4.0–10.5)

## 2011-01-02 LAB — DIFFERENTIAL
Basophils Absolute: 0.1 10*3/uL (ref 0.0–0.1)
Basophils Relative: 0 % (ref 0–1)
Eosinophils Absolute: 0.2 10*3/uL (ref 0.0–0.7)
Eosinophils Relative: 1 % (ref 0–5)
Lymphocytes Relative: 19 % (ref 12–46)
Lymphs Abs: 3 10*3/uL (ref 0.7–4.0)
Monocytes Absolute: 0.8 10*3/uL (ref 0.1–1.0)
Monocytes Relative: 5 % (ref 3–12)
Neutro Abs: 11.5 10*3/uL — ABNORMAL HIGH (ref 1.7–7.7)
Neutrophils Relative %: 74 % (ref 43–77)

## 2011-01-02 LAB — FOLATE: Folate: 6.9 ng/mL

## 2011-01-02 LAB — LACTATE DEHYDROGENASE: LDH: 206 U/L (ref 94–250)

## 2011-01-02 MED ORDER — ALBUTEROL 90 MCG/ACT IN AERS: 2.0000 | INHALATION_SPRAY | Freq: Four times a day (QID) | RESPIRATORY_TRACT | Status: DC | PRN

## 2011-01-02 NOTE — Progress Notes (Signed)
This office note has been dictated.

## 2011-01-02 NOTE — Patient Instructions (Signed)
[facility]  Discharge Instructions  RECOMMENDATIONS MADE BY THE CONSULTANT AND ANY TEST RESULTS WILL BE SENT TO YOUR REFERRING DOCTOR.   SPECIAL INSTRUCTIONS/FOLLOW-UP: Lab work Needed today and after the Thanksgiving holiday and Return to Clinic as scheduled at the front desk.  You will also be scheduled for a CT scan.     I acknowledge that I have been informed and understand all the instructions given to me and received a copy. I do not have any more questions at this time, but understand that I may call the Specialty Clinic at [facility] at [phone] during business hours should I have any further questions or need assistance in obtaining follow-up care.    __________________________________________  _____________  __________ Signature of Patient or Authorized Representative            Date                   Time    __________________________________________ Nurse's Signature

## 2011-01-03 LAB — IRON AND TIBC
Iron: 15 ug/dL — ABNORMAL LOW (ref 42–135)
Saturation Ratios: 5 % — ABNORMAL LOW (ref 20–55)
TIBC: 299 ug/dL (ref 250–470)
UIBC: 284 ug/dL (ref 125–400)

## 2011-01-03 NOTE — Progress Notes (Signed)
CC:   Rochelle D Muse, PA  DIAGNOSES: 1. Leukocytosis. 2. Normocytic anemia. 3. Thrombocytosis, mild. 4. Chronic obstructive pulmonary disease secondary to very     longstanding smoking history. 5. Diabetes mellitus x4 years. 6. Obesity. 7. Bilateral knee replacements by Dr. Myrtie Neither, the first in     2008, the left in 2009. 8. History of hypertension. 9. History of depression though she is on no medication for that. 10.History of occasional use of wine, perhaps 1-2 glasses once or     twice a month.  HISTORY:  This is a very pleasant woman who is 55 years old, referred [facility] Furn[facility], [place] [facility] after she has been found to have several CBCs within the last year where she has had an elevation in her white count.  The differential has shown an increase in granulocytes as well as lymphocytes.  She has had an increase in her platelet count in the 546,000-687,000 range and she has had a normocytic anemia.  She has had 3 Hemoccults that are negative.  She has had a normal B12 level, normal ferritin level.  She has normal kidney function and her diabetes according to Shaunette is pretty well controlled on the metformin which she takes 2 of the 500 mg tablets twice a day.  PAST MEDICAL HISTORY:  Otherwise as mentioned above.  SOCIAL HISTORY:  She is not married, never has been, has never been pregnant.  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  She lives by herself.  FAMILY HISTORY:  Her mother died of congestive heart failure at age 66 along with renal failure.  Her father died when she was very young and has really no history of what happened to him.  REVIEW OF SYSTEMS:  No fevers.  No chills.  No night sweats.  She has a history of a boil which was incised and drained in September 2010 by Dr. Malvin Johns.  She tells me that it was from E. coli.  She states she has never had  MRSA.  At that time her blood counts interestingly showed a normal white count by the time of discharge, a mildly elevated platelet count still, but a normal hemoglobin at admission but mild normocytic anemia at the time of discharge on 12/12/2008.  A gram smear at the time of the boil showed moderate white cells present, abundant gram-positive rods and some moderate gram-negative rods and cocci in clusters, but E. coli was basically the only thing that grew out.  PHYSICAL EXAMINATION:  She is in no acute distress.  She does not admit to being short-winded until I listened to her lungs.  Her weight is 235 pounds.  I do not have a height on her.  She had a blood pressure of 129/76 in the right arm sitting position, pulse right around 88-92 and regular, respirations 18-22 and somewhat shallow.  Temperature is normal.  Skin is warm and dry to the touch.  She has no lymphadenopathy. Breast exam is negative for masses.  Lungs show severe wheezes and tightness bilaterally.  She has no rales, no rubs.  Heart shows a regular rhythm and rate without obvious murmur, rub, or gallop.  She has no thyromegaly.  She has absence of teeth.  Tongue is unremarkable. Pupils equally round, reactive to light.  Abdomen is obese nontender without organomegaly or masses.  She has no peripheral edema.  IMPRESSION: 1. First of all, this lady has bronchial asthma most likely from her     smoking but she is not coughing up any yellow or green phlegm so     will treat her with an albuterol inhaler and if she is not any     better in a day or 2 she knows to [facility]. 2. She has leukocytosis and thrombocytosis, which may be reactive     since her white cells are a mixture of neutrophils and lymphocytes.     I think we need to make sure she does not have iron deficiency with     a serum  iron, TIBC and a folic acid level.  I think the other     thing we need to rule with  leukocytosis and thrombocytosis being     reactive is to make sure she does not have an occult cancer of the     lung.  She has no clubbing of her fingernails and her color is not     cyanotic. 3. The mild normocytic anemia, I think that needs to be evaluated with     the labs I have already mentioned, and if we cannot find the answer     to this she may need a bone marrow aspirate and biopsy.  We will     see her back in a few weeks to follow her along.  We will get some     blood work today.    ______________________________ Ladona Horns. Mariel Sleet, MD ESN/MEDQ  D:  01/02/2011  T:  01/03/2011  Job:  [id number]

## 2011-01-08 ENCOUNTER — Other Ambulatory Visit (HOSPITAL_COMMUNITY): Payer: Self-pay | Admitting: Oncology

## 2011-01-08 ENCOUNTER — Other Ambulatory Visit (HOSPITAL_COMMUNITY): Payer: Self-pay | Admitting: *Deleted

## 2011-01-08 ENCOUNTER — Ambulatory Visit (HOSPITAL_COMMUNITY): Payer: Self-pay | Admitting: Oncology

## 2011-01-08 ENCOUNTER — Ambulatory Visit (HOSPITAL_COMMUNITY)
Admission: RE | Admit: 2011-01-08 | Discharge: 2011-01-08 | Disposition: A | Discharge status: Home / Self Care | Payer: Self-pay | Source: Ambulatory Visit | Attending: Oncology | Admitting: Oncology

## 2011-01-08 DIAGNOSIS — J4489 Other specified chronic obstructive pulmonary disease: Secondary | ICD-10-CM | POA: Insufficient documentation

## 2011-01-08 DIAGNOSIS — J449 Chronic obstructive pulmonary disease, unspecified: Secondary | ICD-10-CM | POA: Insufficient documentation

## 2011-01-08 DIAGNOSIS — F17200 Nicotine dependence, unspecified, uncomplicated: Secondary | ICD-10-CM

## 2011-01-08 DIAGNOSIS — R918 Other nonspecific abnormal finding of lung field: Secondary | ICD-10-CM

## 2011-01-08 DIAGNOSIS — J984 Other disorders of lung: Secondary | ICD-10-CM | POA: Insufficient documentation

## 2011-01-08 DIAGNOSIS — D649 Anemia, unspecified: Secondary | ICD-10-CM

## 2011-01-08 DIAGNOSIS — D72829 Elevated white blood cell count, unspecified: Secondary | ICD-10-CM | POA: Insufficient documentation

## 2011-01-08 DIAGNOSIS — R911 Solitary pulmonary nodule: Secondary | ICD-10-CM

## 2011-01-08 DIAGNOSIS — J45909 Unspecified asthma, uncomplicated: Secondary | ICD-10-CM | POA: Insufficient documentation

## 2011-01-17 ENCOUNTER — Encounter (HOSPITAL_COMMUNITY): Payer: Self-pay

## 2011-02-05 ENCOUNTER — Encounter (HOSPITAL_COMMUNITY): Payer: Self-pay | Attending: Oncology

## 2011-02-05 DIAGNOSIS — J45909 Unspecified asthma, uncomplicated: Secondary | ICD-10-CM | POA: Insufficient documentation

## 2011-02-05 DIAGNOSIS — D72829 Elevated white blood cell count, unspecified: Secondary | ICD-10-CM | POA: Insufficient documentation

## 2011-02-05 DIAGNOSIS — D473 Essential (hemorrhagic) thrombocythemia: Secondary | ICD-10-CM | POA: Insufficient documentation

## 2011-02-05 DIAGNOSIS — D649 Anemia, unspecified: Secondary | ICD-10-CM | POA: Insufficient documentation

## 2011-02-05 LAB — DIFFERENTIAL
Basophils Absolute: 0.1 10*3/uL (ref 0.0–0.1)
Basophils Relative: 1 % (ref 0–1)
Eosinophils Absolute: 0.3 10*3/uL (ref 0.0–0.7)
Eosinophils Relative: 2 % (ref 0–5)
Lymphocytes Relative: 34 % (ref 12–46)
Lymphs Abs: 5.3 10*3/uL — ABNORMAL HIGH (ref 0.7–4.0)
Monocytes Absolute: 0.6 10*3/uL (ref 0.1–1.0)
Monocytes Relative: 4 % (ref 3–12)
Neutro Abs: 9.3 10*3/uL — ABNORMAL HIGH (ref 1.7–7.7)
Neutrophils Relative %: 60 % (ref 43–77)

## 2011-02-05 LAB — CBC
HCT: 37.3 % (ref 36.0–46.0)
Hemoglobin: 11.9 g/dL — ABNORMAL LOW (ref 12.0–15.0)
MCH: 28.1 pg (ref 26.0–34.0)
MCHC: 31.9 g/dL (ref 30.0–36.0)
MCV: 88.2 fL (ref 78.0–100.0)
Platelets: 475 10*3/uL — ABNORMAL HIGH (ref 150–400)
RBC: 4.23 MIL/uL (ref 3.87–5.11)
RDW: 15.3 % (ref 11.5–15.5)
WBC: 15.5 10*3/uL — ABNORMAL HIGH (ref 4.0–10.5)

## 2011-02-05 NOTE — Progress Notes (Signed)
Labs drawn today for cbc/diff 

## 2011-02-09 ENCOUNTER — Encounter (HOSPITAL_COMMUNITY): Payer: Self-pay | Admitting: Oncology

## 2011-02-09 ENCOUNTER — Encounter (HOSPITAL_BASED_OUTPATIENT_CLINIC_OR_DEPARTMENT_OTHER): Payer: Self-pay | Admitting: Oncology

## 2011-02-09 DIAGNOSIS — D75839 Thrombocytosis, unspecified: Secondary | ICD-10-CM

## 2011-02-09 DIAGNOSIS — J449 Chronic obstructive pulmonary disease, unspecified: Secondary | ICD-10-CM | POA: Insufficient documentation

## 2011-02-09 DIAGNOSIS — D473 Essential (hemorrhagic) thrombocythemia: Secondary | ICD-10-CM

## 2011-02-09 DIAGNOSIS — E669 Obesity, unspecified: Secondary | ICD-10-CM | POA: Insufficient documentation

## 2011-02-09 DIAGNOSIS — D649 Anemia, unspecified: Secondary | ICD-10-CM

## 2011-02-09 DIAGNOSIS — D72829 Elevated white blood cell count, unspecified: Secondary | ICD-10-CM

## 2011-02-09 HISTORY — DX: Thrombocytosis, unspecified: D75.839

## 2011-02-09 HISTORY — DX: Anemia, unspecified: D64.9

## 2011-02-09 HISTORY — DX: Elevated white blood cell count, unspecified: D72.829

## 2011-02-09 NOTE — Patient Instructions (Signed)
TZIPORAH KNOKE  [id number] [birth year]  [facility]  Discharge Instructions  RECOMMENDATIONS MADE BY THE CONSULTANT AND ANY TEST RESULTS WILL BE SENT TO YOUR REFERRING DOCTOR.   EXAM FINDINGS BY MD TODAY AND SIGNS AND SYMPTOMS TO REPORT TO CLINIC OR PRIMARY MD: Exam good; return for labs and to see Tom in 3 months.  I acknowledge that I have been informed and understand all the instructions given to me and received a copy. I do not have any more questions at this time, but understand that I may call the Specialty Clinic at [facility] at [phone] during business hours should I have any further questions or need assistance in obtaining follow-up care.    __________________________________________  _____________  __________ Signature of Patient or Authorized Representative            Date                   Time    __________________________________________ Nurse's Signature

## 2011-02-09 NOTE — Progress Notes (Signed)
Debbie Pitter, MD [address] Debbie Mullins  1. Leukocytosis  CBC, Differential  2. Normocytic anemia  CBC, Differential  3. Thrombocytosis  CBC, Differential  4. COPD (chronic obstructive pulmonary disease)    5. Obesity      CURRENT THERAPY: Observation  INTERVAL HISTORY: Debbie Mullins 55 y.o. female returns for  regular  visit for followup of abnormal lab work, leukocytosis, normocytic anemia, and thrombocytosis.  The patient reports an improvement in her asthma since beginning an inhaler.  She denies any complaints and is concerned that she has cancer.  We began with reviewing her lab data. I personally reviewed and went over laboratory results with the patient.  We then went over her Colonoscopy biopsy result which was negative for malignancy.  We then discussed her CT of chest results.  I personally reviewed and went over radiographic studies with the patient. She understands that she has nodules in her lung and that will require CT of chest follow-up in 6 months.  She is at high risk for bronchogenic carcinoma due to her smoking history.  That scan is scheduled for April 2013.    The patient continues to smoke tobacco.  She is now smoking 1/2 ppd.  She denies any complaints.   Past Medical History  Diagnosis Date  . Diabetes mellitus   . GERD (gastroesophageal reflux disease)   . Hypertension   . Depression   . Anemia   . Leukocytosis 02/09/2011  . Normocytic anemia 02/09/2011  . Thrombocytosis 02/09/2011  . COPD (chronic obstructive pulmonary disease) 02/09/2011  . Obesity 02/09/2011    has Leukocytosis; Normocytic anemia; Mild Thrombocytosis; COPD (chronic obstructive pulmonary disease); and Obesity on her problem list.      has no known allergies.  Ms. Keepers does not currently have medications on file.  Past Surgical History  Procedure Date  . Total knee arthroplasty bilateral    left oct. 2008, right feb. 2009    \enies  any headaches, dizziness, double vision, fevers, chills, night sweats, nausea, vomiting, diarrhea, constipation, chest pain, heart palpitations, shortness of breath, blood in stool, black tarry stool, urinary pain, urinary burning, urinary frequency, hematuria.   PHYSICAL EXAMINATION  ECOG PERFORMANCE STATUS: 0 - Asymptomatic  Filed Vitals:   02/09/11 1146  BP: 141/79  Pulse: 80  Temp: 98.2 F (36.8 C)    GENERAL:alert, no distress, well nourished, well developed, comfortable, cooperative, obese, smiling and valgus deformity/deviation of B/L LE SKIN: skin color, texture, turgor are normal, no rashes or significant lesions HEAD: Normocephalic EYES: normal EARS: External ears normal OROPHARYNX:mucous membranes are moist  NECK: supple, no adenopathy, no bruits, thyroid normal size, non-tender, without nodularity, no stridor, non-tender, trachea midline LYMPH:  no palpable lymphadenopathy BREAST:not examined LUNGS: positive findings: wheezing  right upper posterior, right mid posterior, left upper posterior and left mid posterior HEART: regular rate & rhythm, no murmurs and no gallops ABDOMEN:abdomen soft, non-tender, obese and normal bowel sounds BACK: Back symmetric, no curvature., No CVA tenderness EXTREMITIES:less then 2 second capillary refill, no joint deformities, effusion, or inflammation, no skin discoloration, positive findings:  Valgus deformity/deviation of LE  NEURO: alert & oriented x 3 with fluent speech, no focal motor/sensory deficits   LABORATORY DATA: CBC    Component Value Date/Time   WBC 15.5* 02/05/2011 1025   RBC 4.23 02/05/2011 1025   HGB 11.9* 02/05/2011 1025   HCT 37.3 02/05/2011 1025   PLT 475* 02/05/2011 1025   MCV  88.2 02/05/2011 1025   MCH 28.1 02/05/2011 1025   MCHC 31.9 02/05/2011 1025   RDW 15.3 02/05/2011 1025   LYMPHSABS 5.3* 02/05/2011 1025   MONOABS 0.6 02/05/2011 1025   EOSABS 0.3 02/05/2011 1025   BASOSABS 0.1 02/05/2011 1025   Lab  Results  Component Value Date   IRON 15* 01/02/2011   TIBC 299 01/02/2011     RADIOGRAPHIC STUDIES:  01/08/2011  *RADIOLOGY REPORT*  Clinical Data: Leukocytosis, smoker, asthma and anemia. Congestion  and cough.  CT CHEST WITHOUT CONTRAST  Technique: Multidetector CT imaging of the chest was performed  following the standard protocol without IV contrast.  Comparison: None.  Findings: Exam quality is rather degraded by respiratory motion.  No pathologically enlarged mediastinal or axillary lymph nodes.  Hilar regions are difficult to definitively evaluate without IV  contrast. Atherosclerotic calcification of the arterial  vasculature, including coronary arteries. Heart size within normal  limits.  Patchy added density in the lungs may be due to expiratory phase  imaging. Mosaic attenuation can also have this appearance. There  may be a small nodule in the right middle lobe (image 24) which is  difficult to accurately measure due to severe respiratory motion  but is sub centimeter in size. A 3 mm subpleural nodule is seen in  the peripheral right lower lobe (image 28). No pleural fluid.  Airway is unremarkable.  Incidental imaging of the upper abdomen shows no definite acute  findings. No worrisome lytic or sclerotic lesions. Degenerative  changes are seen in the spine.  IMPRESSION:  1. Image quality is fairly severely compromised by severe  respiratory motion.  2. Scattered small right lung nodules. If the patient is at high  risk for bronchogenic carcinoma, follow-up chest CT at 6-12 months  is recommended. If the patient is at low risk for bronchogenic  carcinoma, follow-up chest CT at 12 months is recommended. This  recommendation follows the consensus statement: Guidelines for  Management of Small Pulmonary Nodules Detected on CT Scans: A  Statement from the Fleischner Society as published in Radiology  2005; 237:395-400. Online at:    [email or web address].cz.  3. Patchy added density in the lungs may be due to expiratory  phase imaging. Mosaic attenuation can also have this appearance.  4. Coronary artery calcification.  Original Report Authenticated By: Reyes Ivan, M.D.       PATHOLOGY: FINAL DIAGNOSIS 1. Colon, polyp(s), cecal : POLYPOID COLORECTAL MUCOSA WITH INTRAMUCOSAL LYMPHOID AGGREGATES. NO ADENOMATOUS CHANGE OR MALIGNANCY IDENTIFIED.      ASSESSMENT:  1. Leukocytosis.  2. Normocytic anemia.  3. Thrombocytosis, mild.  4. Chronic obstructive pulmonary disease secondary to very  longstanding smoking history.  5. Diabetes mellitus x4 years.  6. Obesity.  7. Bilateral knee replacements by Dr. Myrtie Neither, the first in 2008, the left in 2009.  8. History of hypertension.  9. History of depression though she is on no medication for that.  10.History of occasional use of wine, perhaps 1-2 glasses once or twice a month.    PLAN:  1. Lab work in 3 months: CBC diff 2. I personally reviewed and went over laboratory results with the patient. 3. I personally reviewed and went over radiographic studies with the patient. 4. Return in 3 months for follow-up. 5. Repeat CT scan of chest in April 2012   All questions were answered. The patient knows to call the clinic with any problems, questions or concerns. We can certainly see the patient much sooner if necessary.  The patient and plan discussed with Glenford Peers, MD and he is in agreement with the aforementioned.   Aylissa Heinemann

## 2011-05-08 ENCOUNTER — Other Ambulatory Visit (HOSPITAL_COMMUNITY): Payer: Self-pay

## 2011-05-10 ENCOUNTER — Ambulatory Visit (HOSPITAL_COMMUNITY): Payer: Self-pay | Admitting: Oncology

## 2011-05-15 ENCOUNTER — Other Ambulatory Visit (HOSPITAL_COMMUNITY): Payer: Self-pay

## 2011-05-15 ENCOUNTER — Encounter (HOSPITAL_COMMUNITY): Payer: Medicare Other | Attending: Oncology

## 2011-05-15 DIAGNOSIS — D75839 Thrombocytosis, unspecified: Secondary | ICD-10-CM

## 2011-05-15 DIAGNOSIS — F329 Major depressive disorder, single episode, unspecified: Secondary | ICD-10-CM | POA: Insufficient documentation

## 2011-05-15 DIAGNOSIS — F17200 Nicotine dependence, unspecified, uncomplicated: Secondary | ICD-10-CM | POA: Insufficient documentation

## 2011-05-15 DIAGNOSIS — E119 Type 2 diabetes mellitus without complications: Secondary | ICD-10-CM | POA: Insufficient documentation

## 2011-05-15 DIAGNOSIS — J4489 Other specified chronic obstructive pulmonary disease: Secondary | ICD-10-CM | POA: Insufficient documentation

## 2011-05-15 DIAGNOSIS — K219 Gastro-esophageal reflux disease without esophagitis: Secondary | ICD-10-CM | POA: Insufficient documentation

## 2011-05-15 DIAGNOSIS — F3289 Other specified depressive episodes: Secondary | ICD-10-CM | POA: Insufficient documentation

## 2011-05-15 DIAGNOSIS — J449 Chronic obstructive pulmonary disease, unspecified: Secondary | ICD-10-CM | POA: Insufficient documentation

## 2011-05-15 DIAGNOSIS — Z96659 Presence of unspecified artificial knee joint: Secondary | ICD-10-CM | POA: Insufficient documentation

## 2011-05-15 DIAGNOSIS — D473 Essential (hemorrhagic) thrombocythemia: Secondary | ICD-10-CM

## 2011-05-15 DIAGNOSIS — D649 Anemia, unspecified: Secondary | ICD-10-CM | POA: Insufficient documentation

## 2011-05-15 DIAGNOSIS — I10 Essential (primary) hypertension: Secondary | ICD-10-CM | POA: Insufficient documentation

## 2011-05-15 DIAGNOSIS — D72829 Elevated white blood cell count, unspecified: Secondary | ICD-10-CM | POA: Insufficient documentation

## 2011-05-15 DIAGNOSIS — D696 Thrombocytopenia, unspecified: Secondary | ICD-10-CM | POA: Insufficient documentation

## 2011-05-15 DIAGNOSIS — E669 Obesity, unspecified: Secondary | ICD-10-CM | POA: Insufficient documentation

## 2011-05-15 LAB — DIFFERENTIAL
Basophils Absolute: 0 10*3/uL (ref 0.0–0.1)
Basophils Relative: 0 % (ref 0–1)
Eosinophils Absolute: 0.3 10*3/uL (ref 0.0–0.7)
Eosinophils Relative: 2 % (ref 0–5)
Lymphocytes Relative: 28 % (ref 12–46)
Lymphs Abs: 3.5 10*3/uL (ref 0.7–4.0)
Monocytes Absolute: 0.4 10*3/uL (ref 0.1–1.0)
Monocytes Relative: 4 % (ref 3–12)
Neutro Abs: 8.2 10*3/uL — ABNORMAL HIGH (ref 1.7–7.7)
Neutrophils Relative %: 66 % (ref 43–77)

## 2011-05-15 LAB — CBC
HCT: 37.3 % (ref 36.0–46.0)
Hemoglobin: 11.7 g/dL — ABNORMAL LOW (ref 12.0–15.0)
MCH: 28 pg (ref 26.0–34.0)
MCHC: 31.4 g/dL (ref 30.0–36.0)
MCV: 89.2 fL (ref 78.0–100.0)
Platelets: 436 10*3/uL — ABNORMAL HIGH (ref 150–400)
RBC: 4.18 MIL/uL (ref 3.87–5.11)
RDW: 14.5 % (ref 11.5–15.5)
WBC: 12.5 10*3/uL — ABNORMAL HIGH (ref 4.0–10.5)

## 2011-05-16 ENCOUNTER — Encounter (HOSPITAL_BASED_OUTPATIENT_CLINIC_OR_DEPARTMENT_OTHER): Payer: Medicare Other | Admitting: Oncology

## 2011-05-16 ENCOUNTER — Encounter (HOSPITAL_COMMUNITY): Payer: Self-pay | Admitting: Oncology

## 2011-05-16 DIAGNOSIS — J449 Chronic obstructive pulmonary disease, unspecified: Secondary | ICD-10-CM

## 2011-05-16 DIAGNOSIS — D72829 Elevated white blood cell count, unspecified: Secondary | ICD-10-CM

## 2011-05-16 DIAGNOSIS — M21069 Valgus deformity, not elsewhere classified, unspecified knee: Secondary | ICD-10-CM

## 2011-05-16 DIAGNOSIS — D649 Anemia, unspecified: Secondary | ICD-10-CM

## 2011-05-16 DIAGNOSIS — D696 Thrombocytopenia, unspecified: Secondary | ICD-10-CM

## 2011-05-16 DIAGNOSIS — E669 Obesity, unspecified: Secondary | ICD-10-CM

## 2011-05-16 DIAGNOSIS — D473 Essential (hemorrhagic) thrombocythemia: Secondary | ICD-10-CM

## 2011-05-16 DIAGNOSIS — D75839 Thrombocytosis, unspecified: Secondary | ICD-10-CM

## 2011-05-16 NOTE — Progress Notes (Signed)
Lab draw

## 2011-05-16 NOTE — Progress Notes (Signed)
[name], MD, MD [address] [place] [address]  1. Leukocytosis   2. Normocytic anemia   3. Mild Thrombocytosis   4. COPD (chronic obstructive pulmonary disease)   5. Obesity   6. Valgus deformity knees     CURRENT THERAPY: Observation  INTERVAL HISTORY: Debbie Mullins 56 y.o. female returns for  regular  visit for followup of leukocytosis.  The patient reports that she continues to smoke 1/2 ppd.  She is trying to cut back on her tobacco use.  I encouraged the patient to refrain from tobacco abuse.   The patient reports that she continues to take the albuterol inhaler as needed.  She reports that her asthma/COPD is better controlled now. Her PCP placed her on a baby aspirin daily.   I personally reviewed and went over laboratory results with the patient.  Her counts are revealing a stable pattern.  Her WBC count is slightly lower, but still mildly elevated.  Her platelets remain very stable.  She is very mildly anemic which we will monitor with a normal MCV.  ROS: No TIA's or unusual headaches, no dysphagia.  No prolonged cough. No dyspnea or chest pain on exertion.  No abdominal pain, change in bowel habits, black or bloody stools.  No urinary tract symptoms.  No new or unusual musculoskeletal symptoms.     Past Medical History  Diagnosis Date  . Diabetes mellitus   . GERD (gastroesophageal reflux disease)   . Hypertension   . Depression   . Anemia   . Leukocytosis 02/09/2011  . Normocytic anemia 02/09/2011  . Thrombocytosis 02/09/2011  . COPD (chronic obstructive pulmonary disease) 02/09/2011  . Obesity 02/09/2011  . Valgus deformity knees 05/16/2011    has Leukocytosis; Normocytic anemia; Mild Thrombocytosis; COPD (chronic obstructive pulmonary disease); Obesity; and Valgus deformity knees on her problem list.      has no known allergies.  Debbie Mullins does not currently have medications on file.  Past Surgical History  Procedure Date  . Total  knee arthroplasty bilateral    left oct. 2008, right feb. 2009    Denies any headaches, dizziness, double vision, fevers, chills, night sweats, nausea, vomiting, diarrhea, constipation, chest pain, heart palpitations, shortness of breath, blood in stool, black tarry stool, urinary pain, urinary burning, urinary frequency, hematuria.   PHYSICAL EXAMINATION  ECOG PERFORMANCE STATUS: 0 - Asymptomatic  Filed Vitals:   05/16/11 1000  BP: 125/78  Pulse: 80  Temp: 98.4 F (36.9 C)    GENERAL:alert, no distress, well nourished, well developed, comfortable, cooperative, obese and smiling SKIN: skin color, texture, turgor are normal, no rashes or significant lesions HEAD: Normocephalic, No masses, lesions, tenderness or abnormalities EYES: normal EARS: External ears normal OROPHARYNX:mucous membranes are moist  NECK: supple, trachea midline LYMPH:  no palpable lymphadenopathy BREAST:not examined LUNGS: clear to auscultation and percussion HEART: regular rate & rhythm, no murmurs, no gallops, S1 normal and S2 normal ABDOMEN:abdomen soft, non-tender, obese and normal bowel sounds BACK: Back symmetric, no curvature., No CVA tenderness EXTREMITIES:less then 2 second capillary refill, no joint deformities, effusion, or inflammation, no edema, no skin discoloration, no clubbing, no cyanosis, valgus deformity of LE extremities, particularly of knees.  NEURO: alert & oriented x 3 with fluent speech, no focal motor/sensory deficits, gait normal    LABORATORY DATA: Results for Debbie Mullins, Debbie Mullins (MRN [id number]) as of 05/16/2011 10:50  Ref. Range 01/02/2011 16:45 01/08/2011 09:08 02/05/2011 10:25 05/15/2011 10:06  WBC Latest Range: 4.0-10.5 K/uL  15.5 (H)  15.5 (H) 12.5 (H)  RBC Latest Range: 3.87-5.11 MIL/uL 4.18  4.23 4.18  HGB Latest Range: 12.0-15.0 g/dL 96.0  45.4 (L) 09.8 (L)  HCT Latest Range: 36.0-46.0 % 37.2  37.3 37.3  MCV Latest Range: 78.0-100.0 fL 89.0  88.2 89.2  MCH Latest Range:  26.0-34.0 pg 28.7  28.1 28.0  MCHC Latest Range: 30.0-36.0 g/dL 11.9  14.7 82.9  RDW Latest Range: 11.5-15.5 % 15.3  15.3 14.5  Platelets Latest Range: 150-400 K/uL 438 (H)  475 (H) 436 (H)  Neutrophils Relative Latest Range: 43-77 % 74  60 66  Lymphocytes Relative Latest Range: 12-46 % 19  34 28  Monocytes Relative Latest Range: 3-12 % 5  4 4   Eosinophils Relative Latest Range: 0-5 % 1  2 2   Basophils Relative Latest Range: 0-1 % 0  1 0  Neutrophils Absolute Latest Range: 1.7-7.7 K/uL 11.5 (H)  9.3 (H) 8.2 (H)  Lymphocytes Absolute Latest Range: 0.7-4.0 K/uL 3.0  5.3 (H) 3.5  Monocytes Absolute Latest Range: 0.1-1.0 K/uL 0.8  0.6 0.4  Eosinophils Absolute Latest Range: 0.0-0.7 K/uL 0.2  0.3 0.3  Basophils Absolute Latest Range: 0.0-0.1 K/uL 0.1  0.1 0.0     ASSESSMENT:  1. Leukocytosis.  2. Normocytic anemia.  3. Thrombocytosis, mild.  4. Chronic obstructive pulmonary disease secondary to very  longstanding smoking history.  5. Diabetes mellitus x4 years.  6. Obesity.  7. Bilateral knee replacements by Dr. Myrtie Neither, the first in 2008, the left in 2009.  8. History of hypertension.  9. History of depression though she is on no medication for that.  10.History of occasional use of wine, perhaps 1-2 glasses once or twice a month.  PLAN:  1. I personally reviewed and went over laboratory results with the patient. 2. Scheduled for CT of chest to follow-up on lung nodules on April 22 3. Discussed stability of lab work. 4. Encouraged the patient to quit smoking. 5. Return 1-2 days following CT scan of chest to review results.  Pending results, we will get blood work 3 months later and have her follow-up with Dr. Mariel Sleet at end of July.  All questions were answered. The patient knows to call the clinic with any problems, questions or concerns. We can certainly see the patient much sooner if necessary.  The patient and plan discussed with Glenford Peers, MD and he is in agreement  with the aforementioned.  Debbie Mullins

## 2011-05-16 NOTE — Patient Instructions (Signed)
Debbie Mullins  [id number] 06-20-1955   [facility]  Discharge Instructions  RECOMMENDATIONS MADE BY THE CONSULTANT AND ANY TEST RESULTS WILL BE SENT TO YOUR REFERRING DOCTOR.   EXAM FINDINGS BY MD TODAY AND SIGNS AND SYMPTOMS TO REPORT TO CLINIC OR PRIMARY MD: your blood counts are stable.  Continue eating the "good" foods.  MEDICATIONS PRESCRIBED: none   INSTRUCTIONS GIVEN AND DISCUSSED: Other :  Report increased fatigue, shortness of breath, etc.  SPECIAL INSTRUCTIONS/FOLLOW-UP: Xray Studies Needed in April and Return to Clinic on after scan in April.   I acknowledge that I have been informed and understand all the instructions given to me and received a copy. I do not have any more questions at this time, but understand that I may call the Specialty Clinic at [facility] at [phone] during business hours should I have any further questions or need assistance in obtaining follow-up care.    __________________________________________  _____________  __________ Signature of Patient or Authorized Representative            Date                   Time    __________________________________________ Nurse's Signature

## 2011-06-04 ENCOUNTER — Ambulatory Visit (HOSPITAL_COMMUNITY): Payer: Self-pay | Admitting: Oncology

## 2011-07-02 ENCOUNTER — Encounter (HOSPITAL_COMMUNITY): Payer: Medicare Other

## 2011-07-02 ENCOUNTER — Ambulatory Visit (HOSPITAL_COMMUNITY)
Admission: RE | Admit: 2011-07-02 | Discharge: 2011-07-02 | Disposition: A | Discharge status: Home / Self Care | Payer: Medicare Other | Source: Ambulatory Visit | Attending: Oncology | Admitting: Oncology

## 2011-07-02 DIAGNOSIS — R918 Other nonspecific abnormal finding of lung field: Secondary | ICD-10-CM

## 2011-07-02 DIAGNOSIS — Z09 Encounter for follow-up examination after completed treatment for conditions other than malignant neoplasm: Secondary | ICD-10-CM | POA: Insufficient documentation

## 2011-07-02 DIAGNOSIS — F17200 Nicotine dependence, unspecified, uncomplicated: Secondary | ICD-10-CM

## 2011-07-02 DIAGNOSIS — J984 Other disorders of lung: Secondary | ICD-10-CM | POA: Insufficient documentation

## 2011-07-04 ENCOUNTER — Encounter (HOSPITAL_COMMUNITY): Payer: Medicare Other | Attending: Oncology | Admitting: Oncology

## 2011-07-04 ENCOUNTER — Encounter (HOSPITAL_COMMUNITY): Payer: Self-pay | Admitting: Oncology

## 2011-07-04 VITALS — BP 139/83 | HR 75 | Temp 98.5°F | Wt 239.2 lb

## 2011-07-04 DIAGNOSIS — D649 Anemia, unspecified: Secondary | ICD-10-CM | POA: Insufficient documentation

## 2011-07-04 DIAGNOSIS — Z720 Tobacco use: Secondary | ICD-10-CM | POA: Insufficient documentation

## 2011-07-04 DIAGNOSIS — D473 Essential (hemorrhagic) thrombocythemia: Secondary | ICD-10-CM | POA: Insufficient documentation

## 2011-07-04 DIAGNOSIS — F17200 Nicotine dependence, unspecified, uncomplicated: Secondary | ICD-10-CM

## 2011-07-04 DIAGNOSIS — N898 Other specified noninflammatory disorders of vagina: Secondary | ICD-10-CM

## 2011-07-04 DIAGNOSIS — D72829 Elevated white blood cell count, unspecified: Secondary | ICD-10-CM | POA: Insufficient documentation

## 2011-07-04 DIAGNOSIS — D75839 Thrombocytosis, unspecified: Secondary | ICD-10-CM

## 2011-07-04 NOTE — Progress Notes (Signed)
Debbie Pitter, MD, MD [address] [place] [address]  1. Leukocytosis  CBC, Differential, Comprehensive metabolic panel  2. Normocytic anemia  CBC, Differential, Comprehensive metabolic panel  3. Mild Thrombocytosis  CBC, Differential, Comprehensive metabolic panel  4. Tobacco abuse  CT Chest Wo Contrast    CURRENT THERAPY:Observation  INTERVAL HISTORY: Debbie Mullins 56 y.o. female returns for  regular  visit for followup of leukocytosis  The patient is here today to review her recent CT scan of chest to follow-up on pulmonary nodules.  Ct reveals : Scattered pulmonary nodules measuring up to 4 mm.  it was recommended that a followup CT scan be performed in 12 months time. I place this order for a followup CT scan without contrast and 12 months.  I personally reviewed and went over laboratory results with the patient.  Her white blood cell count and the count remained very stable.  The patient relays an interesting story to [place]. She reports that a few days ago she had a menstrual cycle which she has not had an approximately 2 years. She reports that her last for about 5 days. She had a heavy flow and changed her pads approximately every 3 hours. Her pads were for heavy flows. She has an upcoming appointment with her primary care physician. She did not have an OB/GYN physician. I have strongly encouraged the patient reports at this to her primary care physician when he sees her in a few weeks time.  The patient admits that she continues to smoke approximately half a pack per day of cigarettes. I spoke to her about smoking cessation. I question her about electronic cigarettes. She has not consider this as an option to help her quit smoking. We discussed the benefits of the electronic cigarette namely, the lack of products of combustion.  We also discussed the cost of electronic cigarettes. He will consider this to help wean herself from the tobacco.  Otherwise, the patient is  doing well. She denies any complaints other than her typical chronic joint aches which is likely secondary to her obesity and valgus deformity of knees.  Past Medical History  Diagnosis Date  . Diabetes mellitus   . GERD (gastroesophageal reflux disease)   . Hypertension   . Depression   . Anemia   . Leukocytosis 02/09/2011  . Normocytic anemia 02/09/2011  . Thrombocytosis 02/09/2011  . COPD (chronic obstructive pulmonary disease) 02/09/2011  . Obesity 02/09/2011  . 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 Tobacco abuse 07/04/2011    has Leukocytosis; Normocytic anemia; Mild Thrombocytosis; COPD (chronic obstructive pulmonary disease); Obesity; Valgus deformity knees; and Tobacco abuse on her problem list.      has no known allergies.  Debbie Mullins does not currently have medications on file.  Past Surgical History  Procedure Date  . Total knee arthroplasty bilateral    left oct. 2008, right feb. 2009    Denies any headaches, dizziness, double vision, fevers, chills, night sweats, nausea, vomiting, diarrhea, constipation, chest pain, heart palpitations, shortness of breath, blood in stool, black tarry stool, urinary pain, urinary burning, urinary frequency, hematuria.   PHYSICAL EXAMINATION  ECOG PERFORMANCE STATUS: 1 - Symptomatic but completely ambulatory  Filed Vitals:   07/04/11 1124  BP: 139/83  Pulse: 75  Temp: 98.5 F (36.9 C)    GENERAL:alert, no distress, well nourished, well developed, comfortable, cooperative, obese and smiling SKIN: skin color, texture, turgor are normal, no rashes or significant lesions HEAD: Normocephalic, No masses,  lesions, tenderness or abnormalities EYES: normal, EOMI, Conjunctiva are pink and non-injected EARS: External ears normal OROPHARYNX:lips, buccal mucosa, and tongue normal and mucous membranes are moist  NECK: supple, no adenopathy, thyroid normal size, non-tender, without nodularity, no stridor, non-tender, trachea  midline LYMPH:  no palpable lymphadenopathy, no hepatosplenomegaly BREAST:not examined LUNGS: clear to auscultation and percussion, decreased breath sounds HEART: regular rate & rhythm, no murmurs, no gallops, S1 normal and S2 normal ABDOMEN:abdomen soft, non-tender, obese, normal bowel sounds, no masses or organomegaly and no hepatosplenomegaly BACK: Back symmetric, no curvature., No CVA tenderness EXTREMITIES:less then 2 second capillary refill, no joint deformities, effusion, or inflammation, no edema, no skin discoloration, no clubbing, no cyanosis, positive findings:  valgus deformity of LE extremities, particularly of knees.   NEURO: alert & oriented x 3 with fluent speech, no focal motor/sensory deficits, gait normal   LABORATORY DATA: Results for Debbie, Mullins (MRN [id number]) as of 07/04/2011 17:42  Ref. Range 12/09/2008 05:50 12/12/2008 06:10 01/02/2011 16:45 02/05/2011 10:25 05/15/2011 10:06  WBC Latest Range: 4.0-10.5 K/uL 10.0 8.6 15.5 (H) 15.5 (H) 12.5 (H)  RBC Latest Range: 3.87-5.11 MIL/uL 4.00 3.92 4.18 4.23 4.18  HGB Latest Range: 12.0-15.0 g/dL 46.9 (L) 62.9 (L) 52.8 11.9 (L) 11.7 (L)  HCT Latest Range: 36.0-46.0 % 33.0 (L) 32.6 (L) 37.2 37.3 37.3  MCV Latest Range: 78.0-100.0 fL 82.6 83.4 89.0 88.2 89.2  MCH Latest Range: 26.0-34.0 pg   28.7 28.1 28.0  MCHC Latest Range: 30.0-36.0 g/dL 41.3 24.4 01.0 27.2 53.6  RDW Latest Range: 11.5-15.5 % 18.7 (H) 19.1 (H) 15.3 15.3 14.5  Platelets Latest Range: 150-400 K/uL 488 (H) 544 (H) 438 (H) 475 (H) 436 (H)  Neutrophils Relative Latest Range: 43-77 % 78 (H) 64 74 60 66  Lymphocytes Relative Latest Range: 12-46 % 16 29 19  34 28  Monocytes Relative Latest Range: 3-12 % 5 5 5 4 4   Eosinophils Relative Latest Range: 0-5 % 1 2 1 2 2   Basophils Relative Latest Range: 0-1 % 0 0 0 1 0  NEUT# Latest Range: 1.7-7.7 K/uL 7.8 (H) 5.5 11.5 (H) 9.3 (H) 8.2 (H)  Lymphocytes Absolute Latest Range: 0.7-4.0 K/uL 1.6 2.5 3.0 5.3 (H) 3.5   Monocytes Absolute Latest Range: 0.1-1.0 K/uL 0.5 0.4 0.8 0.6 0.4  Eosinophils Absolute Latest Range: 0.0-0.7 K/uL 0.0 0.1 0.2 0.3 0.3  Basophils Absolute Latest Range: 0.0-0.1 K/uL 0.0 0.0 0.1 0.1 0.0     RADIOGRAPHIC STUDIES:  07/02/2011  *RADIOLOGY REPORT*  Clinical Data: Follow up pulmonary nodules  CT CHEST WITHOUT CONTRAST  Technique: Multidetector CT imaging of the chest was performed  following the standard protocol without IV contrast.  Comparison: 01/08/2011  Findings: Evaluation is constrained by severe respiratory motion.  Scattered small pulmonary nodules, as follows:  --Possible 3 mm left upper lobe nodule (series 3/image 17),  equivocal  --2 mm medial left lower lobe nodule (series 3/image 22)  --4 mm right upper lobe nodule (series 3/image 24)  --3 mm subpleural right lower lobe nodule (series 3/image 27)  --4 mm right lower lobe nodule (series 3/image 31)  Mosaic attenuation. No pleural effusion or pneumothorax.  Visualized thyroid is unremarkable.  The heart is normal in size. No pericardial effusion. Coronary  atherosclerosis. Atherosclerotic calcifications of the aortic  arch.  No suspicious mediastinal or axillary lymphadenopathy.  Visualized upper abdomen is grossly unremarkable.  Degenerative changes of the visualized thoracolumbar spine.  IMPRESSION:  Evaluation is constrained by severe respiratory motion.  Scattered pulmonary nodules  measuring up to 4 mm, as described  above. Additional follow-up CT chest is suggested in 12 months.  Age advanced coronary atherosclerosis.  Original Report Authenticated By: Charline Bills, M.D.     ASSESSMENT:  1. Leukocytosis. Stable. 2. Normocytic anemia.  3. Thrombocytosis, mild. Stable. 4. Chronic obstructive pulmonary disease secondary to very  longstanding smoking history.  5. Diabetes mellitus x4 years.  6. Obesity.  7. Bilateral knee replacements by Dr. Myrtie Neither, the first in 2008, the left in  2009.  8. History of hypertension.  9. History of depression though she is on no medication for that.  10.History of occasional use of wine, perhaps 1-2 glasses once or twice a month.  11. Valgus deformity of the knees     PLAN:  1. I personally reviewed and went over laboratory results with the patient. 2. I personally reviewed and went over radiographic studies with the patient. 3. CT scan of chest without contrast in 12 months 4. Lab work in 3 months: CBC diff, CMET 5. Education regarding smoking cessation.  Discussed the potential benefits of the electronic cigarettes.  Patient will consider. 6. Recommended patient follow-up with PCP regarding episode of vaginal bleeding. 7. Return in 3 months for follow-up.   All questions were answered. The patient knows to call the clinic with any problems, questions or concerns. We can certainly see the patient much sooner if necessary.  Nadine Ryle

## 2011-07-04 NOTE — Patient Instructions (Signed)
Debbie Mullins  [id number] January 02, 1956 Dr. Glenford Peers  [facility]  Discharge Instructions  RECOMMENDATIONS MADE BY THE CONSULTANT AND ANY TEST RESULTS WILL BE SENT TO YOUR REFERRING DOCTOR.   Continue to see your primary physician as scheduled.  Return to clinic in July for labs and then to see the MD.  We will schedule you for CT scans in 1 year.   I acknowledge that I have been informed and understand all the instructions given to me and received a copy. I do not have any more questions at this time, but understand that I may call the Specialty Clinic at [facility] at [phone] during business hours should I have any further questions or need assistance in obtaining follow-up care.    __________________________________________  _____________  __________ Signature of Patient or Authorized Representative            Date                   Time    __________________________________________ Nurse's Signature

## 2011-10-03 ENCOUNTER — Encounter (HOSPITAL_COMMUNITY): Payer: Medicare Other | Attending: Oncology

## 2011-10-03 DIAGNOSIS — J4489 Other specified chronic obstructive pulmonary disease: Secondary | ICD-10-CM | POA: Insufficient documentation

## 2011-10-03 DIAGNOSIS — J449 Chronic obstructive pulmonary disease, unspecified: Secondary | ICD-10-CM | POA: Insufficient documentation

## 2011-10-03 DIAGNOSIS — D72829 Elevated white blood cell count, unspecified: Secondary | ICD-10-CM

## 2011-10-03 DIAGNOSIS — F17200 Nicotine dependence, unspecified, uncomplicated: Secondary | ICD-10-CM | POA: Insufficient documentation

## 2011-10-03 DIAGNOSIS — D473 Essential (hemorrhagic) thrombocythemia: Secondary | ICD-10-CM | POA: Insufficient documentation

## 2011-10-03 DIAGNOSIS — D649 Anemia, unspecified: Secondary | ICD-10-CM | POA: Insufficient documentation

## 2011-10-03 DIAGNOSIS — E669 Obesity, unspecified: Secondary | ICD-10-CM | POA: Insufficient documentation

## 2011-10-03 LAB — COMPREHENSIVE METABOLIC PANEL
ALT: 11 U/L (ref 0–35)
AST: 13 U/L (ref 0–37)
Albumin: 3.7 g/dL (ref 3.5–5.2)
Alkaline Phosphatase: 119 U/L — ABNORMAL HIGH (ref 39–117)
BUN: 17 mg/dL (ref 6–23)
CO2: 26 mEq/L (ref 19–32)
Calcium: 9.3 mg/dL (ref 8.4–10.5)
Chloride: 99 mEq/L (ref 96–112)
Creatinine, Ser: 0.91 mg/dL (ref 0.50–1.10)
GFR calc Af Amer: 81 mL/min — ABNORMAL LOW (ref >90)
GFR calc non Af Amer: 70 mL/min — ABNORMAL LOW (ref >90)
Glucose, Bld: 86 mg/dL (ref 70–99)
Potassium: 4.5 mEq/L (ref 3.5–5.1)
Sodium: 136 mEq/L (ref 135–145)
Total Bilirubin: 0.3 mg/dL (ref 0.3–1.2)
Total Protein: 8 g/dL (ref 6.0–8.3)

## 2011-10-03 LAB — DIFFERENTIAL
Basophils Absolute: 0.1 10*3/uL (ref 0.0–0.1)
Basophils Relative: 0 % (ref 0–1)
Eosinophils Absolute: 0.4 10*3/uL (ref 0.0–0.7)
Eosinophils Relative: 3 % (ref 0–5)
Lymphocytes Relative: 38 % (ref 12–46)
Lymphs Abs: 6.2 10*3/uL — ABNORMAL HIGH (ref 0.7–4.0)
Monocytes Absolute: 0.6 10*3/uL (ref 0.1–1.0)
Monocytes Relative: 4 % (ref 3–12)
Neutro Abs: 8.9 10*3/uL — ABNORMAL HIGH (ref 1.7–7.7)
Neutrophils Relative %: 55 % (ref 43–77)

## 2011-10-03 LAB — CBC
HCT: 37.5 % (ref 36.0–46.0)
Hemoglobin: 12.5 g/dL (ref 12.0–15.0)
MCH: 28.7 pg (ref 26.0–34.0)
MCHC: 33.3 g/dL (ref 30.0–36.0)
MCV: 86.2 fL (ref 78.0–100.0)
Platelets: 387 10*3/uL (ref 150–400)
RBC: 4.35 MIL/uL (ref 3.87–5.11)
RDW: 15.1 % (ref 11.5–15.5)
WBC: 16.2 10*3/uL — ABNORMAL HIGH (ref 4.0–10.5)

## 2011-10-03 NOTE — Progress Notes (Signed)
Labs drawn today for cbc/diff,cmp 

## 2011-10-05 ENCOUNTER — Ambulatory Visit (HOSPITAL_COMMUNITY): Payer: Medicare Other | Admitting: Oncology

## 2011-10-11 ENCOUNTER — Encounter (HOSPITAL_COMMUNITY): Payer: Medicare Other | Attending: Oncology | Admitting: Oncology

## 2011-10-11 VITALS — BP 146/68 | HR 83 | Temp 97.8°F | Wt 239.1 lb

## 2011-10-11 DIAGNOSIS — D696 Thrombocytopenia, unspecified: Secondary | ICD-10-CM

## 2011-10-11 DIAGNOSIS — D72829 Elevated white blood cell count, unspecified: Secondary | ICD-10-CM | POA: Insufficient documentation

## 2011-10-11 DIAGNOSIS — D649 Anemia, unspecified: Secondary | ICD-10-CM

## 2011-10-11 DIAGNOSIS — E119 Type 2 diabetes mellitus without complications: Secondary | ICD-10-CM

## 2011-10-11 NOTE — Progress Notes (Signed)
Debbie Pitter, MD [address] [place] [address]  1. Leukocytosis  naproxen (NAPROSYN) 500 MG tablet, montelukast (SINGULAIR) 10 MG tablet, CBC, Differential, Basic metabolic panel    CURRENT THERAPY: Observation  INTERVAL HISTORY: Debbie Mullins 56 y.o. female returns for  regular  visit for followup of leukocytosis   I personally reviewed and went over laboratory results with the patient.  Her WBC count remains elevated and it is predominantly neutrophil and lymphocytic driven.  Otherwise, her Hgb and Platelet count are stable and within normal limits.   Charles only complaint is fatigue. She otherwise denies any B. symptomatology including fevers, chills, drenching night sweats, decreased appetite, and unintentional weight loss. She does report some night sweats that are not drenching. She denies having to change her night clothes or bad spread or below case due to night sweats. She reports that she's just hot. She reports that her appetite is about 75%. She does report weight loss although this is not documented in [facility].  Otherwise, the patient denies any complaints. She continues to smoke a half a pack of cigarettes per day. Her leukocytosis could certainly be from her cigarette smoking, but we will rule out other etiologies including CLL with a peripheral blood flow cytometry today.  We spent some time discussing this laboratory test and what is looking for. He understands his looking for CLL. We discussed the implications of this. We will schedule her for lab work in 4 months return in 4 months. If the flow cytometry is abnormal, we'll certainly bring the patient in sooner to discuss these results. Otherwise the plan will be as mentioned above.  Past Medical History  Diagnosis Date  . Diabetes mellitus   . GERD (gastroesophageal reflux disease)   . Hypertension   . Depression   . Anemia   . Leukocytosis 02/09/2011  . Normocytic anemia 02/09/2011  . Thrombocytosis  02/09/2011  . COPD (chronic obstructive pulmonary disease) 02/09/2011  . Obesity 02/09/2011  . 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 Tobacco abuse 07/04/2011    has Leukocytosis; Normocytic anemia; Mild Thrombocytosis; COPD (chronic obstructive pulmonary disease); Obesity; Valgus deformity knees; and Tobacco abuse on her problem list.      has no known allergies.  Ms. Searing does not currently have medications on file.  Past Surgical History  Procedure Date  . Total knee arthroplasty bilateral    left oct. 2008, right feb. 2009    Denies any headaches, dizziness, double vision, fevers, chills, night sweats, nausea, vomiting, diarrhea, constipation, chest pain, heart palpitations, shortness of breath, blood in stool, black tarry stool, urinary pain, urinary burning, urinary frequency, hematuria.   PHYSICAL EXAMINATION  ECOG PERFORMANCE STATUS: 1 - Symptomatic but completely ambulatory  Filed Vitals:   10/11/11 1305  BP: 146/68  Pulse: 83  Temp: 97.8 F (36.6 C)    GENERAL:alert, no distress, well nourished, well developed, comfortable, cooperative, obese and smiling SKIN: skin color, texture, turgor are normal, no rashes or significant lesions HEAD: Normocephalic, No masses, lesions, tenderness or abnormalities EYES: normal, Conjunctiva are pink and non-injected EARS: External ears normal OROPHARYNX:lips, buccal mucosa, and tongue normal and mucous membranes are moist  NECK: supple, no adenopathy, trachea midline LYMPH:  no palpable lymphadenopathy, no hepatosplenomegaly BREAST:not examined LUNGS: clear to auscultation and percussion HEART: regular rate & rhythm, no murmurs, no gallops, S1 normal and S2 normal ABDOMEN:abdomen soft, non-tender, obese, normal bowel sounds, no masses or organomegaly and no hepatosplenomegaly BACK: Back symmetric, no curvature.,  No CVA tenderness EXTREMITIES:less then 2 second capillary refill, no joint deformities, effusion, or inflammation,  no edema, no skin discoloration, no clubbing, no cyanosis  NEURO: alert & oriented x 3 with fluent speech, no focal motor/sensory deficits   LABORATORY DATA: CBC    Component Value Date/Time   WBC 16.2* 10/03/2011 1208   RBC 4.35 10/03/2011 1208   HGB 12.5 10/03/2011 1208   HCT 37.5 10/03/2011 1208   PLT 387 10/03/2011 1208   MCV 86.2 10/03/2011 1208   MCH 28.7 10/03/2011 1208   MCHC 33.3 10/03/2011 1208   RDW 15.1 10/03/2011 1208   LYMPHSABS 6.2* 10/03/2011 1208   MONOABS 0.6 10/03/2011 1208   EOSABS 0.4 10/03/2011 1208   BASOSABS 0.1 10/03/2011 1208      Chemistry      Component Value Date/Time   NA 136 10/03/2011 1208   K 4.5 10/03/2011 1208   CL 99 10/03/2011 1208   CO2 26 10/03/2011 1208   BUN 17 10/03/2011 1208   CREATININE 0.91 10/03/2011 1208      Component Value Date/Time   CALCIUM 9.3 10/03/2011 1208   ALKPHOS 119* 10/03/2011 1208   AST 13 10/03/2011 1208   ALT 11 10/03/2011 1208   BILITOT 0.3 10/03/2011 1208         ASSESSMENT:  1. Leukocytosis. Stable.  2. Normocytic anemia.  3. Thrombocytosis, mild. Stable.  4. Chronic obstructive pulmonary disease secondary to very  longstanding smoking history.  5. Diabetes mellitus x4 years.  6. Obesity.  7. Bilateral knee replacements by Dr. Myrtie Neither, the first in 2008, the left in 2009.  8. History of hypertension.  9. History of depression though she is on no medication for that.  10.History of occasional use of wine, perhaps 1-2 glasses once or twice a month.  11. Valgus deformity of the knees    PLAN:  1. I personally reviewed and went over laboratory results with the patient. 2. Follow-up CT of chest without contrast scheduled for April 2014 for follow-up on pulmonary nodules.  3. I personally reviewed and went over radiographic studies with the patient. 4. Lab work today: Peripheral blood for flow cytometry to rule out CLL. 5. Lab work in 4 months: CBC diff, BMET 6. Return in 4 months for follow-up.  All  questions were answered. The patient knows to call the clinic with any problems, questions or concerns. We can certainly see the patient much sooner if necessary.  The patient and plan discussed with Glenford Peers, MD and he is in agreement with the aforementioned.  Damin Salido

## 2011-10-11 NOTE — Patient Instructions (Signed)
[facility]  Discharge Instructions Debbie Mullins  [id number] August 17, 1955 Dr. Glenford Peers   RECOMMENDATIONS MADE BY THE CONSULTANT AND ANY TEST RESULTS WILL BE SENT TO YOUR REFERRING DOCTOR.  Return in 4 months for labs  Return in 4 months to see Dr. Mariel Sleet   I acknowledge that I have been informed and understand all the instructions given to me and received a copy. I do not have any more questions at this time, but understand that I may call the Specialty Clinic at [facility] at [phone] during business hours should I have any further questions or need assistance in obtaining follow-up care.    __________________________________________  _____________  __________ Signature of Patient or Authorized Representative            Date                   Time    __________________________________________ Nurse's Signature

## 2012-01-02 ENCOUNTER — Emergency Department (HOSPITAL_COMMUNITY): Payer: Medicare Other

## 2012-01-02 ENCOUNTER — Inpatient Hospital Stay (HOSPITAL_COMMUNITY)
Admission: EM | Admit: 2012-01-02 | Discharge: 2012-01-08 | DRG: 291 | Disposition: A | Discharge status: Home / Self Care | Payer: Medicare Other | Attending: Internal Medicine | Admitting: Internal Medicine

## 2012-01-02 ENCOUNTER — Encounter (HOSPITAL_COMMUNITY): Payer: Self-pay | Admitting: Physical Medicine and Rehabilitation

## 2012-01-02 DIAGNOSIS — I10 Essential (primary) hypertension: Secondary | ICD-10-CM

## 2012-01-02 DIAGNOSIS — N181 Chronic kidney disease, stage 1: Secondary | ICD-10-CM

## 2012-01-02 DIAGNOSIS — I129 Hypertensive chronic kidney disease with stage 1 through stage 4 chronic kidney disease, or unspecified chronic kidney disease: Secondary | ICD-10-CM | POA: Diagnosis present

## 2012-01-02 DIAGNOSIS — Z96659 Presence of unspecified artificial knee joint: Secondary | ICD-10-CM

## 2012-01-02 DIAGNOSIS — E119 Type 2 diabetes mellitus without complications: Secondary | ICD-10-CM | POA: Diagnosis present

## 2012-01-02 DIAGNOSIS — E1122 Type 2 diabetes mellitus with diabetic chronic kidney disease: Secondary | ICD-10-CM

## 2012-01-02 DIAGNOSIS — K219 Gastro-esophageal reflux disease without esophagitis: Secondary | ICD-10-CM | POA: Diagnosis present

## 2012-01-02 DIAGNOSIS — E1129 Type 2 diabetes mellitus with other diabetic kidney complication: Secondary | ICD-10-CM

## 2012-01-02 DIAGNOSIS — J810 Acute pulmonary edema: Secondary | ICD-10-CM

## 2012-01-02 DIAGNOSIS — I5032 Chronic diastolic (congestive) heart failure: Secondary | ICD-10-CM

## 2012-01-02 DIAGNOSIS — I5033 Acute on chronic diastolic (congestive) heart failure: Principal | ICD-10-CM

## 2012-01-02 DIAGNOSIS — J449 Chronic obstructive pulmonary disease, unspecified: Secondary | ICD-10-CM

## 2012-01-02 DIAGNOSIS — F3289 Other specified depressive episodes: Secondary | ICD-10-CM | POA: Diagnosis present

## 2012-01-02 DIAGNOSIS — Z720 Tobacco use: Secondary | ICD-10-CM

## 2012-01-02 DIAGNOSIS — D72829 Elevated white blood cell count, unspecified: Secondary | ICD-10-CM

## 2012-01-02 DIAGNOSIS — D649 Anemia, unspecified: Secondary | ICD-10-CM

## 2012-01-02 DIAGNOSIS — E669 Obesity, unspecified: Secondary | ICD-10-CM

## 2012-01-02 DIAGNOSIS — M21069 Valgus deformity, not elsewhere classified, unspecified knee: Secondary | ICD-10-CM

## 2012-01-02 DIAGNOSIS — E875 Hyperkalemia: Secondary | ICD-10-CM | POA: Diagnosis present

## 2012-01-02 DIAGNOSIS — J9601 Acute respiratory failure with hypoxia: Secondary | ICD-10-CM

## 2012-01-02 DIAGNOSIS — I279 Pulmonary heart disease, unspecified: Secondary | ICD-10-CM | POA: Diagnosis present

## 2012-01-02 DIAGNOSIS — J9600 Acute respiratory failure, unspecified whether with hypoxia or hypercapnia: Secondary | ICD-10-CM | POA: Diagnosis present

## 2012-01-02 DIAGNOSIS — I272 Pulmonary hypertension, unspecified: Secondary | ICD-10-CM

## 2012-01-02 DIAGNOSIS — I509 Heart failure, unspecified: Secondary | ICD-10-CM | POA: Diagnosis present

## 2012-01-02 DIAGNOSIS — F17200 Nicotine dependence, unspecified, uncomplicated: Secondary | ICD-10-CM | POA: Diagnosis present

## 2012-01-02 DIAGNOSIS — F329 Major depressive disorder, single episode, unspecified: Secondary | ICD-10-CM | POA: Diagnosis present

## 2012-01-02 DIAGNOSIS — E785 Hyperlipidemia, unspecified: Secondary | ICD-10-CM | POA: Diagnosis present

## 2012-01-02 DIAGNOSIS — D75839 Thrombocytosis, unspecified: Secondary | ICD-10-CM

## 2012-01-02 DIAGNOSIS — D473 Essential (hemorrhagic) thrombocythemia: Secondary | ICD-10-CM | POA: Diagnosis present

## 2012-01-02 DIAGNOSIS — J441 Chronic obstructive pulmonary disease with (acute) exacerbation: Secondary | ICD-10-CM

## 2012-01-02 LAB — COMPREHENSIVE METABOLIC PANEL
ALT: 11 U/L (ref 0–35)
AST: 12 U/L (ref 0–37)
Albumin: 3.4 g/dL — ABNORMAL LOW (ref 3.5–5.2)
Alkaline Phosphatase: 136 U/L — ABNORMAL HIGH (ref 39–117)
BUN: 26 mg/dL — ABNORMAL HIGH (ref 6–23)
CO2: 23 mEq/L (ref 19–32)
Calcium: 8.6 mg/dL (ref 8.4–10.5)
Chloride: 95 mEq/L — ABNORMAL LOW (ref 96–112)
Creatinine, Ser: 1.14 mg/dL — ABNORMAL HIGH (ref 0.50–1.10)
GFR calc Af Amer: 62 mL/min — ABNORMAL LOW (ref >90)
GFR calc non Af Amer: 54 mL/min — ABNORMAL LOW (ref >90)
Glucose, Bld: 144 mg/dL — ABNORMAL HIGH (ref 70–99)
Potassium: 4.9 mEq/L (ref 3.5–5.1)
Sodium: 130 mEq/L — ABNORMAL LOW (ref 135–145)
Total Bilirubin: 0.2 mg/dL — ABNORMAL LOW (ref 0.3–1.2)
Total Protein: 7.8 g/dL (ref 6.0–8.3)

## 2012-01-02 LAB — CREATININE, SERUM
Creatinine, Ser: 1.17 mg/dL — ABNORMAL HIGH (ref 0.50–1.10)
GFR calc Af Amer: 60 mL/min — ABNORMAL LOW (ref >90)
GFR calc non Af Amer: 52 mL/min — ABNORMAL LOW (ref >90)

## 2012-01-02 LAB — CBC
HCT: 37.7 % (ref 36.0–46.0)
HCT: 37.1 % (ref 36.0–46.0)
Hemoglobin: 12.5 g/dL (ref 12.0–15.0)
Hemoglobin: 12.3 g/dL (ref 12.0–15.0)
MCH: 30.9 pg (ref 26.0–34.0)
MCH: 31.4 pg (ref 26.0–34.0)
MCHC: 33.2 g/dL (ref 30.0–36.0)
MCHC: 33.2 g/dL (ref 30.0–36.0)
MCV: 93.1 fL (ref 78.0–100.0)
MCV: 94.6 fL (ref 78.0–100.0)
Platelets: 446 10*3/uL — ABNORMAL HIGH (ref 150–400)
Platelets: 477 10*3/uL — ABNORMAL HIGH (ref 150–400)
RBC: 4.05 MIL/uL (ref 3.87–5.11)
RBC: 3.92 MIL/uL (ref 3.87–5.11)
RDW: 16.5 % — ABNORMAL HIGH (ref 11.5–15.5)
RDW: 16.5 % — ABNORMAL HIGH (ref 11.5–15.5)
WBC: 14.3 10*3/uL — ABNORMAL HIGH (ref 4.0–10.5)
WBC: 15.6 10*3/uL — ABNORMAL HIGH (ref 4.0–10.5)

## 2012-01-02 LAB — GLUCOSE, CAPILLARY: Glucose-Capillary: 237 mg/dL — ABNORMAL HIGH (ref 70–99)

## 2012-01-02 LAB — TROPONIN I: Troponin I: <0.3 ng/mL (ref <0.30)

## 2012-01-02 LAB — MRSA PCR SCREENING: MRSA by PCR: NEGATIVE

## 2012-01-02 LAB — PRO B NATRIURETIC PEPTIDE: Pro B Natriuretic peptide (BNP): 6755 pg/mL — ABNORMAL HIGH (ref 0–125)

## 2012-01-02 MED ORDER — GUAIFENESIN-DM 100-10 MG/5ML PO SYRP
5.0000 mL | ORAL_SOLUTION | ORAL | Status: DC | PRN
  Administered 2012-01-03 – 2012-01-07 (×6): 5 mL via ORAL
  Filled 2012-01-02 (×6): qty 5

## 2012-01-02 MED ORDER — IPRATROPIUM BROMIDE 0.02 % IN SOLN
0.5000 mg | RESPIRATORY_TRACT | Status: DC
  Filled 2012-01-02: qty 2.5

## 2012-01-02 MED ORDER — ACETAMINOPHEN 650 MG RE SUPP: 650.0000 mg | Freq: Four times a day (QID) | RECTAL | Status: DC | PRN

## 2012-01-02 MED ORDER — OXYCODONE HCL 5 MG PO TABS
5.0000 mg | ORAL_TABLET | ORAL | Status: DC | PRN
  Administered 2012-01-03 – 2012-01-08 (×8): 5 mg via ORAL
  Filled 2012-01-02 (×10): qty 1

## 2012-01-02 MED ORDER — ALBUTEROL SULFATE (5 MG/ML) 0.5% IN NEBU: 2.5000 mg | INHALATION_SOLUTION | RESPIRATORY_TRACT | Status: DC | PRN

## 2012-01-02 MED ORDER — ASPIRIN 81 MG PO TABS: 81.0000 mg | ORAL_TABLET | Freq: Every day | ORAL | Status: DC

## 2012-01-02 MED ORDER — MORPHINE SULFATE 2 MG/ML IJ SOLN: 1.0000 mg | INTRAMUSCULAR | Status: DC | PRN

## 2012-01-02 MED ORDER — LISINOPRIL 10 MG PO TABS
10.0000 mg | ORAL_TABLET | Freq: Every day | ORAL | Status: DC
  Administered 2012-01-03: 10 mg via ORAL
  Filled 2012-01-02: qty 1

## 2012-01-02 MED ORDER — NICOTINE 21 MG/24HR TD PT24
21.0000 mg | MEDICATED_PATCH | Freq: Every day | TRANSDERMAL | Status: DC
  Administered 2012-01-02 – 2012-01-08 (×7): 21 mg via TRANSDERMAL
  Filled 2012-01-02 (×7): qty 1

## 2012-01-02 MED ORDER — IPRATROPIUM BROMIDE 0.02 % IN SOLN
0.5000 mg | Freq: Four times a day (QID) | RESPIRATORY_TRACT | Status: DC
  Administered 2012-01-03 – 2012-01-05 (×11): 0.5 mg via RESPIRATORY_TRACT
  Filled 2012-01-02 (×11): qty 2.5

## 2012-01-02 MED ORDER — INSULIN ASPART 100 UNIT/ML ~~LOC~~ SOLN
0.0000 [IU] | Freq: Three times a day (TID) | SUBCUTANEOUS | Status: DC
  Administered 2012-01-03: 7 [IU] via SUBCUTANEOUS
  Administered 2012-01-03: 4 [IU] via SUBCUTANEOUS
  Administered 2012-01-03: 3 [IU] via SUBCUTANEOUS
  Administered 2012-01-04: 7 [IU] via SUBCUTANEOUS
  Administered 2012-01-04: 4 [IU] via SUBCUTANEOUS
  Administered 2012-01-04 – 2012-01-05 (×2): 11 [IU] via SUBCUTANEOUS
  Administered 2012-01-05: 3 [IU] via SUBCUTANEOUS
  Administered 2012-01-06: 4 [IU] via SUBCUTANEOUS
  Administered 2012-01-06 (×2): 3 [IU] via SUBCUTANEOUS
  Administered 2012-01-07: 4 [IU] via SUBCUTANEOUS

## 2012-01-02 MED ORDER — METHYLPREDNISOLONE SODIUM SUCC 125 MG IJ SOLR
125.0000 mg | Freq: Once | INTRAMUSCULAR | Status: AC
  Administered 2012-01-02: 125 mg via INTRAVENOUS
  Filled 2012-01-02: qty 2

## 2012-01-02 MED ORDER — IPRATROPIUM BROMIDE 0.02 % IN SOLN
0.5000 mg | Freq: Once | RESPIRATORY_TRACT | Status: AC
  Administered 2012-01-02: 0.5 mg via RESPIRATORY_TRACT

## 2012-01-02 MED ORDER — METHYLPREDNISOLONE SODIUM SUCC 125 MG IJ SOLR
80.0000 mg | Freq: Three times a day (TID) | INTRAMUSCULAR | Status: DC
  Administered 2012-01-02 – 2012-01-03 (×2): 80 mg via INTRAVENOUS
  Filled 2012-01-02 (×5): qty 1.28

## 2012-01-02 MED ORDER — PANTOPRAZOLE SODIUM 40 MG PO TBEC
40.0000 mg | DELAYED_RELEASE_TABLET | Freq: Every day | ORAL | Status: DC
  Administered 2012-01-02 – 2012-01-08 (×7): 40 mg via ORAL
  Filled 2012-01-02 (×7): qty 1

## 2012-01-02 MED ORDER — SODIUM CHLORIDE 0.9 % IJ SOLN
3.0000 mL | Freq: Two times a day (BID) | INTRAMUSCULAR | Status: DC
  Administered 2012-01-02 – 2012-01-08 (×11): 3 mL via INTRAVENOUS

## 2012-01-02 MED ORDER — ASPIRIN 81 MG PO CHEW
81.0000 mg | CHEWABLE_TABLET | Freq: Every day | ORAL | Status: DC
  Administered 2012-01-03 – 2012-01-08 (×6): 81 mg via ORAL
  Filled 2012-01-02 (×6): qty 1

## 2012-01-02 MED ORDER — MOXIFLOXACIN HCL IN NACL 400 MG/250ML IV SOLN
400.0000 mg | INTRAVENOUS | Status: DC
  Administered 2012-01-02 – 2012-01-05 (×4): 400 mg via INTRAVENOUS
  Filled 2012-01-02 (×6): qty 250

## 2012-01-02 MED ORDER — HYDROCHLOROTHIAZIDE 12.5 MG PO CAPS
12.5000 mg | ORAL_CAPSULE | Freq: Every day | ORAL | Status: DC
  Administered 2012-01-03 – 2012-01-05 (×3): 12.5 mg via ORAL
  Filled 2012-01-02 (×3): qty 1

## 2012-01-02 MED ORDER — ALBUTEROL SULFATE (5 MG/ML) 0.5% IN NEBU
2.5000 mg | INHALATION_SOLUTION | Freq: Four times a day (QID) | RESPIRATORY_TRACT | Status: DC
  Administered 2012-01-03 – 2012-01-05 (×11): 2.5 mg via RESPIRATORY_TRACT
  Filled 2012-01-02 (×11): qty 0.5

## 2012-01-02 MED ORDER — ACETAMINOPHEN 325 MG PO TABS: 650.0000 mg | ORAL_TABLET | Freq: Four times a day (QID) | ORAL | Status: DC | PRN

## 2012-01-02 MED ORDER — ALBUTEROL (5 MG/ML) CONTINUOUS INHALATION SOLN
10.0000 mg/h | INHALATION_SOLUTION | RESPIRATORY_TRACT | Status: AC
  Administered 2012-01-02: 10 mg/h via RESPIRATORY_TRACT
  Filled 2012-01-02: qty 20

## 2012-01-02 MED ORDER — LISINOPRIL-HYDROCHLOROTHIAZIDE 10-12.5 MG PO TABS: 1.0000 | ORAL_TABLET | Freq: Every day | ORAL | Status: DC

## 2012-01-02 MED ORDER — ENOXAPARIN SODIUM 40 MG/0.4ML ~~LOC~~ SOLN
40.0000 mg | SUBCUTANEOUS | Status: DC
  Administered 2012-01-02 – 2012-01-07 (×6): 40 mg via SUBCUTANEOUS
  Filled 2012-01-02 (×7): qty 0.4

## 2012-01-02 MED ORDER — INSULIN ASPART 100 UNIT/ML ~~LOC~~ SOLN
0.0000 [IU] | Freq: Every day | SUBCUTANEOUS | Status: DC
  Administered 2012-01-02 – 2012-01-07 (×2): 2 [IU] via SUBCUTANEOUS

## 2012-01-02 NOTE — H&P (Signed)
[facility] History and Physical  Debbie Mullins ZOX:[id number] DOB: 1955/04/18 DOA: 01/02/2012  Referring physician: Shelly Flatten, ER resident physician PCP: [name], MD  Specialists: None  Chief Complaint: Shortness of breath  HPI: Debbie Mullins is a 56 y.o. female with past medical history tobacco abuse, hypertension and diabetes who for the past week has had progressively worsening shortness of breath with wheezing as well as productive cough with whitish sputum. It got to the point where she felt she could not breathe today and went to see her primary care physician. In the office she was noted to have oxygen saturations in the 70s. She was given a breathing treatment sent over to the emergency room. Patient was given a dose of Solu-Medrol, high flow oxygen and continuous nebulizers but she continued to have significant wheezing and tightness. Her breathing improved to 94% oxygen saturation with 4 L and nebulizers. It was felt likely patient was having an acute exacerbation of COPD. This would be the patient's first exacerbation. Her blood work was otherwise essentially unremarkable. I have requested a BNP which is pending. Chest x-ray showed minimal pulmonary edema but otherwise unremarkable.   Review of Systems: I saw the patient emergency room, she was still feeling quite short of breath. She slid only a little bit better than when she first arrived. She denied any headaches, vision changes. She was complaining of some overall tightness across her entire chest, wheezing, shortness of breath, cough with clear sputum. She denied any abdominal pain, hematuria, dysuria, constipation, diarrhea, focal extremity numbness or weakness or pain. Review systems otherwise negative.  Past Medical History  Diagnosis Date  . Diabetes mellitus   . GERD (gastroesophageal reflux disease)   . Hypertension   . Depression   . Anemia   . Leukocytosis 02/09/2011  . Normocytic anemia  02/09/2011  . Thrombocytosis 02/09/2011  . COPD (chronic obstructive pulmonary disease) 02/09/2011  . Obesity 02/09/2011  . 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 Tobacco abuse 07/04/2011   Past Surgical History  Procedure Date  . Total knee arthroplasty bilateral    left oct. 2008, right feb. 2009   Social History:  reports that she has been smoking Cigarettes.  She has a 30 pack-year smoking history. She has never used smokeless tobacco. She reports that she drinks about .6 ounces of alcohol per week. She reports that she does not use illicit drugs. K patient normally lives at home by herself. She is able to participate in activities of daily living previously without any issues until her breathing started getting more of a week ago.  No Known Allergies  Family History  Problem Relation Age of Onset  . Heart failure Mother   . Cancer Maternal Grandmother     Prior to Admission medications   Medication Sig Start Date End Date Taking? Authorizing Provider  acetaminophen (TYLENOL) 500 MG tablet Take 500 mg by mouth every 6 (six) hours as needed.   Yes Historical Provider, MD  albuterol (PROVENTIL HFA;VENTOLIN HFA) 108 (90 BASE) MCG/ACT inhaler Inhale 2 puffs into the lungs every 6 (six) hours as needed. For shortness of breath or wheezing   Yes Historical Provider, MD  aspirin 81 MG tablet Take 81 mg by mouth daily.   Yes Historical Provider, MD  esomeprazole (NEXIUM) 40 MG capsule Take 40 mg by mouth daily before breakfast.     Yes Historical Provider, MD  lisinopril-hydrochlorothiazide (PRINZIDE,ZESTORETIC) 10-12.5 MG per tablet Take 1 tablet by mouth daily.  Yes Historical Provider, MD  metFORMIN (GLUCOPHAGE) 500 MG tablet Take 500 mg by mouth 2 (two) times daily with a meal.     Yes Historical Provider, MD  montelukast (SINGULAIR) 10 MG tablet Take 10 mg by mouth at bedtime.   Yes Historical Provider, MD  naproxen (NAPROSYN) 500 MG tablet Take 500 mg by mouth 2 (two) times daily with  a meal.   Yes Historical Provider, MD  albuterol (PROVENTIL,VENTOLIN) 90 MCG/ACT inhaler Inhale 2 puffs into the lungs every 6 (six) hours as needed for wheezing. 01/02/11 12/28/11  Randall An, MD   Physical Exam: Filed Vitals:   01/02/12 1404 01/02/12 1430 01/02/12 1500 01/02/12 1504  BP: 149/91  123/57 123/57  Pulse: 100  100 90  Temp: 97.9 F (36.6 C)     TempSrc: Oral     Resp:    21  SpO2: 70% 94% 74% 93%     General:  Moderate distress secondary to shortness of breath, fatigue, looks about stated age, alert and oriented x3  Eyes: Sclera is nonicteric, extraocular movements are intact  ENT: Normocephalic, atraumatic, mucous membranes are slightly dry-patient is currently getting nebulizer facemask  Neck: Supple no appreciable thyromegaly  Cardiovascular: Regular rhythm, occasional ectopic beat  Respiratory: Minimal airway exchange with bilateral expiratory wheezing  Abdomen: Soft, nontender, nondistended, positive bowel sounds  Skin: No skin breaks, tears or lesions  Musculoskeletal: No clubbing or cyanosis or edema  Psychiatric: Patient is appropriate, no evidence of psychoses  Neurologic: No focal deficits  Labs on Admission:  Basic Metabolic Panel:  Lab 01/02/12 1610  NA 130*  K 4.9  CL 95*  CO2 23  GLUCOSE 144*  BUN 26*  CREATININE 1.14*  CALCIUM 8.6  MG --  PHOS --   Liver Function Tests:  Lab 01/02/12 1422  AST 12  ALT 11  ALKPHOS 136*  BILITOT 0.2*  PROT 7.8  ALBUMIN 3.4*    CBC:  Lab 01/02/12 1422  WBC 14.3*  NEUTROABS --  HGB 12.5  HCT 37.7  MCV 93.1  PLT 446*    Radiological Exams on Admission: Dg Chest 2 View  01/02/2012    IMPRESSION: Hazy lung density bilaterally, question pulmonary edema   Original Report Authenticated By: Camelia Phenes, M.D.     EKG: Independently reviewed. Normal sinus rhythm with inverted T waves seen in inferior and lateral leads  Assessment/Plan Principal Problem:  *COPD with acute  exacerbation: Given markedly wheezing and tightness in exam, when placed in the step down unit. She was IV steroids plus nebulizers plus antibiotics plus oxygen. ABG in the morning. Given minimal pulmonary edema seen on exam and history of hypertension, will check a BNP. Also? EKG changes so we'll check cardiac markers.  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 Will cover with sliding scale, possible Lantus given high-dose steroids  HTN (hypertension): Continue home meds.  1.   Code Status: Full code  Family Communication: Plan discussed with patient as well as her cousin who is at the bedside Disposition Plan: Step down unit initially, likely be several days before patient can be discharged home Time spent: 45 minutes  Hollice Espy [facility] Pager [phone]  If 7PM-7AM, please contact night-coverage [email or web address] Password [email or web address] 01/02/2012, 4:14 PM

## 2012-01-02 NOTE — ED Provider Notes (Signed)
2:07 PM  Date: 01/02/2012  Rate: 99  Rhythm: normal sinus rhythm  QRS Axis: right  Intervals: QT prolonged  ST/T Wave abnormalities:  Inverted T waves in inferior and lateral leads  Conduction Disutrbances:  Prolonged QT interval  Narrative Interpretation: Abnormal EKG  Old EKG Reviewed: changes noted--inverted T waves in inferior and lateral leads are new since 12/07/2008.    Carleene Cooper III, MD 01/02/12 [phone]

## 2012-01-02 NOTE — ED Notes (Signed)
Pt presents to department for evaluation of SOB. From Dr. Tedra [place] office. 70% RA upon arrival to ED. States she has been short of breath x3 days, becomes worse with exertion. Some relief with nebulizer treatments at PCP office. Labored respirations upon arrival. Speaking short sentences. She is conscious alert and oriented x4.

## 2012-01-02 NOTE — ED Notes (Signed)
Continuous neb stopped for transport to xray. Will continue when pt returns.

## 2012-01-02 NOTE — ED Notes (Signed)
Dr. Davidson at the bedside. 

## 2012-01-02 NOTE — ED Provider Notes (Signed)
History     CSN: [id number]  Arrival date & time 01/02/12  1348   First MD Initiated Contact with Patient 01/02/12 1404      Chief Complaint  Patient presents with  . Shortness of Breath    (Consider location/radiation/quality/duration/timing/severity/associated sxs/prior treatment) HPI CC: SOB  Pt is a 56yo AAF w/ increasing SOB for the past week. Pt Dx w/ URI approximately 3 wks ago and started on Albuterol TID by PCP, which was decreased to BID 2 wks ago. Pt symptoms somewhat relieved during this time but little effect of inhaler over last week. Robitussin DM w/ some benefit. Denies any home O2 requirement but has frequent exacerbations w/ changes in weather and URIs. Pt notes being in church, standing by [facility] which worsened her symptoms. PT went to PCP today adn was noted to have O2 sat in 70s which did not improve w/ Albuterol, and told to come to ED. Denies use of any controller medications. Continues to smoke 1/2 ppd. Denies palpitations, CP, Syncope, n/v/d/c, fever, rash  Past Medical History  Diagnosis Date  . Diabetes mellitus   . GERD (gastroesophageal reflux disease)   . Hypertension   . Depression   . Anemia   . Leukocytosis 02/09/2011  . Normocytic anemia 02/09/2011  . Thrombocytosis 02/09/2011  . COPD (chronic obstructive pulmonary disease) 02/09/2011  . Obesity 02/09/2011  . 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 Tobacco abuse 07/04/2011    Past Surgical History  Procedure Date  . Total knee arthroplasty bilateral    left oct. 2008, right feb. 2009    Family History  Problem Relation Age of Onset  . Heart failure Mother   . Cancer Maternal Grandmother     History  Substance Use Topics  . Smoking status: Current Every Day Smoker -- 1.5 packs/day for 20 years    Types: Cigarettes  . Smokeless tobacco: Never Used  . Alcohol Use: 0.6 oz/week    1 Glasses of wine per week     once a month    OB History    Grav Para Term Preterm Abortions TAB SAB Ect  Mult Living                  Review of Systems  All other systems reviewed and are negative.    Allergies  Review of patient's allergies indicates no known allergies.  Home Medications   Current Outpatient Rx  Name Route Sig Dispense Refill  . ACETAMINOPHEN 500 MG PO TABS Oral Take 500 mg by mouth every 6 (six) hours as needed.    . ALBUTEROL SULFATE HFA 108 (90 BASE) MCG/ACT IN AERS Inhalation Inhale 2 puffs into the lungs every 6 (six) hours as needed. For shortness of breath or wheezing    . ASPIRIN 81 MG PO TABS Oral Take 81 mg by mouth daily.    Marland Kitchen ESOMEPRAZOLE MAGNESIUM 40 MG PO CPDR Oral Take 40 mg by mouth daily before breakfast.      . LISINOPRIL-HYDROCHLOROTHIAZIDE 10-12.5 MG PO TABS Oral Take 1 tablet by mouth daily.      Marland Kitchen METFORMIN HCL 500 MG PO TABS Oral Take 500 mg by mouth 2 (two) times daily with a meal.      . MONTELUKAST SODIUM 10 MG PO TABS Oral Take 10 mg by mouth at bedtime.    Marland Kitchen NAPROXEN 500 MG PO TABS Oral Take 500 mg by mouth 2 (two) times daily with a meal.    . ALBUTEROL 90 MCG/ACT IN  AERS Inhalation Inhale 2 puffs into the lungs every 6 (six) hours as needed for wheezing. 17 g 12    BP 123/57  Pulse 90  Temp 97.9 F (36.6 C) (Oral)  Resp 21  SpO2 93%  LMP 07/03/2011  Physical Exam  Nursing note and vitals reviewed. Constitutional: She is oriented to person, place, and time. She appears well-developed and well-nourished. No distress.       Obese  HENT:  Head: Normocephalic and atraumatic.  Eyes: Pupils are equal, round, and reactive to light.  Neck: Normal range of motion.  Cardiovascular: Normal rate and intact distal pulses.   Pulmonary/Chest:       Mild resp distress.  Mildly course breath sounds bilat Inspiratory adn expiratory breath sounds bilat No crackles or consolidation heard  Abdominal: Soft. Normal appearance. She exhibits no distension.  Musculoskeletal: Normal range of motion.       1+ LE edema which is at baseline per pt    Neurological: She is alert and oriented to person, place, and time. No cranial nerve deficit.  Skin: Skin is warm and dry. No rash noted.  Psychiatric: She has a normal mood and affect. Her behavior is normal.    ED Course  Procedures (including critical care time)  Labs Reviewed  CBC - Abnormal; Notable for the following:    WBC 14.3 (*)     RDW 16.5 (*)     Platelets 446 (*)     All other components within normal limits  COMPREHENSIVE METABOLIC PANEL - Abnormal; Notable for the following:    Sodium 130 (*)     Chloride 95 (*)     Glucose, Bld 144 (*)     BUN 26 (*)     Creatinine, Ser 1.14 (*)     Albumin 3.4 (*)     Alkaline Phosphatase 136 (*)     Total Bilirubin 0.2 (*)     GFR calc non Af Amer 54 (*)     GFR calc Af Amer 62 (*)     All other components within normal limits  URINALYSIS, ROUTINE W REFLEX MICROSCOPIC   Dg Chest 2 View  01/02/2012  *RADIOLOGY REPORT*  Clinical Data: Short of breath  CHEST - 2 VIEW  Comparison: CT 07/02/2011, chest x-ray 12/08/2008  Findings: Heart size is upper normal.  Vascular congestion.  Patchy airspace disease appears similar to the prior CT and could represent mild edema versus chronic lung disease.  No confluent infiltrate or effusion.  No mass lesion.  IMPRESSION: Hazy lung density bilaterally, question pulmonary edema   Original Report Authenticated By: Camelia Phenes, M.D.      No diagnosis found.    MDM  56yo AAF w/ apparent COPD/Asthma flare secondary to URI and weather changes. Prolonged Albuterol use w/o controller medications is concerning for lack of effectiveness of Albuterol.  - CXR - Continuous Alb neb, w/ one time Atrovent - CBC, CMET, UA - Solumedrol 125mg  x1   Update: Pt now w/ more than 1hr of continuous Albuterol w/o much benefit. Continues to sound very tight and wheezy and requires >4L O2 in order to keep sats in mid to upper 90s. Pt also w/ ARF by labs which is new and other lab abnormalities that have been  previously noted and receiving active workup by oncology.  - Consult to [facility] for [facility] (Spoke to the [facility] physician concerning the case and he will be down to evaluate and admit the pt). Appreciate their  assistance.        Ozella Rocks, MD 01/02/12 1547  Ozella Rocks, MD 01/02/12 [phone]

## 2012-01-02 NOTE — ED Provider Notes (Signed)
I saw and evaluated the patient, reviewed the resident's note and I agree with the findings and plan. 56 yo woman with 3 week Hx of wheezing, Rx with albuterol inhaler without relief.  Seen by her PCP who noted O2 sat low and sent her to the ED.  Exam showed bilateral wheezing.  Rx with IV Solumedrol and continuous albuterol nebulizer Rx, without relief.  Admitted to [facility].  Carleene Cooper III, MD 01/02/12 [phone]

## 2012-01-02 NOTE — ED Notes (Signed)
Dr. Merrell at the bedside.  

## 2012-01-03 DIAGNOSIS — J9601 Acute respiratory failure with hypoxia: Secondary | ICD-10-CM | POA: Diagnosis present

## 2012-01-03 DIAGNOSIS — J810 Acute pulmonary edema: Secondary | ICD-10-CM

## 2012-01-03 LAB — GLUCOSE, CAPILLARY
Glucose-Capillary: 162 mg/dL — ABNORMAL HIGH (ref 70–99)
Glucose-Capillary: 144 mg/dL — ABNORMAL HIGH (ref 70–99)
Glucose-Capillary: 251 mg/dL — ABNORMAL HIGH (ref 70–99)
Glucose-Capillary: 93 mg/dL (ref 70–99)

## 2012-01-03 LAB — BASIC METABOLIC PANEL
BUN: 22 mg/dL (ref 6–23)
CO2: 26 mEq/L (ref 19–32)
Calcium: 8.9 mg/dL (ref 8.4–10.5)
Chloride: 96 mEq/L (ref 96–112)
Creatinine, Ser: 0.99 mg/dL (ref 0.50–1.10)
GFR calc Af Amer: 74 mL/min — ABNORMAL LOW (ref >90)
GFR calc non Af Amer: 63 mL/min — ABNORMAL LOW (ref >90)
Glucose, Bld: 164 mg/dL — ABNORMAL HIGH (ref 70–99)
Potassium: 5.2 mEq/L — ABNORMAL HIGH (ref 3.5–5.1)
Sodium: 132 mEq/L — ABNORMAL LOW (ref 135–145)

## 2012-01-03 LAB — POTASSIUM: Potassium: 5.3 mEq/L — ABNORMAL HIGH (ref 3.5–5.1)

## 2012-01-03 LAB — HEMOGLOBIN A1C
Hgb A1c MFr Bld: 6.6 % — ABNORMAL HIGH (ref <5.7)
Mean Plasma Glucose: 143 mg/dL — ABNORMAL HIGH (ref <117)

## 2012-01-03 LAB — URINALYSIS, ROUTINE W REFLEX MICROSCOPIC
Bilirubin Urine: NEGATIVE
Glucose, UA: 100 mg/dL — AB
Ketones, ur: NEGATIVE mg/dL
Nitrite: POSITIVE — AB
Protein, ur: NEGATIVE mg/dL
Specific Gravity, Urine: 1.011 (ref 1.005–1.030)
Urobilinogen, UA: 0.2 mg/dL (ref 0.0–1.0)
pH: 5.5 (ref 5.0–8.0)

## 2012-01-03 LAB — TROPONIN I
Troponin I: <0.3 ng/mL (ref <0.30)
Troponin I: <0.3 ng/mL (ref <0.30)

## 2012-01-03 LAB — CBC
HCT: 34.8 % — ABNORMAL LOW (ref 36.0–46.0)
Hemoglobin: 11.5 g/dL — ABNORMAL LOW (ref 12.0–15.0)
MCH: 30.8 pg (ref 26.0–34.0)
MCHC: 33 g/dL (ref 30.0–36.0)
MCV: 93.3 fL (ref 78.0–100.0)
Platelets: 429 10*3/uL — ABNORMAL HIGH (ref 150–400)
RBC: 3.73 MIL/uL — ABNORMAL LOW (ref 3.87–5.11)
RDW: 16.1 % — ABNORMAL HIGH (ref 11.5–15.5)
WBC: 9.3 10*3/uL (ref 4.0–10.5)

## 2012-01-03 LAB — BLOOD GAS, ARTERIAL
Acid-base deficit: 0.1 mmol/L (ref 0.0–2.0)
Bicarbonate: 25.5 mEq/L — ABNORMAL HIGH (ref 20.0–24.0)
O2 Content: 2 L/min
O2 Saturation: 90.7 %
Patient temperature: 98.6
TCO2: 27.2 mmol/L (ref 0–100)
pCO2 arterial: 53.2 mmHg — ABNORMAL HIGH (ref 35.0–45.0)
pH, Arterial: 7.302 — ABNORMAL LOW (ref 7.350–7.450)
pO2, Arterial: 69.6 mmHg — ABNORMAL LOW (ref 80.0–100.0)

## 2012-01-03 LAB — URINE MICROSCOPIC-ADD ON

## 2012-01-03 MED ORDER — BIOTENE DRY MOUTH MT LIQD
15.0000 mL | Freq: Two times a day (BID) | OROMUCOSAL | Status: DC
  Administered 2012-01-03 – 2012-01-08 (×12): 15 mL via OROMUCOSAL

## 2012-01-03 MED ORDER — FUROSEMIDE 10 MG/ML IJ SOLN
40.0000 mg | Freq: Two times a day (BID) | INTRAMUSCULAR | Status: DC
  Filled 2012-01-03 (×2): qty 4

## 2012-01-03 MED ORDER — FUROSEMIDE 10 MG/ML IJ SOLN
40.0000 mg | Freq: Two times a day (BID) | INTRAMUSCULAR | Status: DC
  Administered 2012-01-03 – 2012-01-04 (×2): 40 mg via INTRAVENOUS
  Filled 2012-01-03 (×4): qty 4

## 2012-01-03 MED ORDER — METHYLPREDNISOLONE SODIUM SUCC 40 MG IJ SOLR
40.0000 mg | Freq: Three times a day (TID) | INTRAMUSCULAR | Status: DC
  Administered 2012-01-03 – 2012-01-04 (×4): 40 mg via INTRAVENOUS
  Filled 2012-01-03 (×6): qty 1

## 2012-01-03 NOTE — Progress Notes (Signed)
  Echocardiogram 2D Echocardiogram has been performed.  Cathie Beams 01/03/2012, 3:14 PM

## 2012-01-03 NOTE — Progress Notes (Signed)
[facility] PROGRESS NOTE  Debbie Mullins:[id number] DOB: Jan 09, 1956 DOA: 01/02/2012 PCP: [name], MD  Assessment/Plan: Principal Problem:  *Acute respiratory failure with hypoxia I suspect this is multifactorial due to COPD and pulmonary edema.   Active Problems:  COPD with acute exacerbation This has improved considerable with Nebs and steroids, will decrease solumedrol to 40 TID today.    Pulmonary edema, acute Significant amount of crackles heard on exam- she is not moving and moving air well however, Pulse ox drops down to the 80s once O2 is removed.  Have started lasix and have ordered an ECHO  Hyperkalemia Recheck later today- cause uncertain. Holding ACE.    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    Code Status: full code Disposition Plan: transfer out of SDU DVT prophylaxis: Lovenox, ASA   Brief narrative: Debbie Mullins is a 56 y.o. female with past medical history tobacco abuse, hypertension and diabetes who for the past week has had progressively worsening shortness of breath with wheezing as well as productive cough with whitish sputum. It got to the point where she felt she could not breathe and went to see her primary care physician. In the office she was noted to have oxygen saturations in the 70s. She was given a breathing treatment sent over to the emergency room. Patient was given a dose of Solu-Medrol, high flow oxygen and continuous nebulizers but she continued to have significant wheezing and tightness. Her breathing improved to 94% oxygen saturation with 4 L and nebulizers. It was felt likely patient was having an acute exacerbation of COPD. This would be the patient's  first exacerbation. Her blood work was otherwise essentially unremarkable. Chest x-ray showed minimal pulmonary edema.   Consultants:  none  Procedures:  none  Antibiotics:  Avelox 10/23  HPI/Subjective: Patient states that she is breathing much better today. She has a mild cough which is still productive of clear sputum. She admits that her ankles often get swollen and being the the diuretics has helped.   Objective: Filed Vitals:   01/03/12 0835 01/03/12 1000 01/03/12 1401 01/03/12 1627  BP:  125/66  138/73  Pulse:    94  Temp:    97.4 F (36.3 C)  TempSrc:    Oral  Resp:    24  Height:      Weight:      SpO2: 97%  99% 96%    Intake/Output Summary (Last 24 hours) at 01/03/12 1632 Last data filed at 01/03/12 0900  Gross per 24 hour  Intake    970 ml  Output    750 ml  Net    220 ml    Exam:   General:  Alert, no acute resp distress  Cardiovascular: RRR, no murmurs  Respiratory: crackles at bilateral bases, mild ronchi  Abdomen: soft, NT, ND, BS+  Ext: no c/c/e  Data Reviewed: Basic Metabolic Panel:  Lab 01/03/12 8119 01/02/12 1856 01/02/12 1422  NA 132* -- 130*  K 5.2* -- 4.9  CL 96 -- 95*  CO2 26 -- 23  GLUCOSE 164* -- 144*  BUN 22 -- 26*  CREATININE 0.99 1.17* 1.14*  CALCIUM 8.9 -- 8.6  MG -- -- --  PHOS -- -- --   Liver Function Tests:  Lab 01/02/12 1422  AST 12  ALT 11  ALKPHOS 136*  BILITOT 0.2*  PROT 7.8  ALBUMIN 3.4*   No results found for this basename: LIPASE:5,AMYLASE:5 in the last 168 hours No results found for this basename: AMMONIA:5 in the last 168 hours CBC:  Lab 01/03/12 0726 01/02/12 1856 01/02/12 1422  WBC 9.3 15.6* 14.3*  NEUTROABS -- -- --  HGB 11.5* 12.3 12.5  HCT 34.8* 37.1 37.7  MCV 93.3 94.6 93.1  PLT 429* 477* 446*   Cardiac Enzymes:  Lab 01/03/12 0726 01/03/12 0107 01/02/12 1856  CKTOTAL -- -- --  CKMB -- -- --  CKMBINDEX -- -- --  TROPONINI <0.30 <0.30 <0.30   BNP (last 3 results)  Basename  01/02/12 1550  PROBNP 6755.0*   CBG:  Lab 01/03/12 1228 01/03/12 0812 01/02/12 2114  GLUCAP 144* 162* 237*    Recent Results (from the past 240 hour(s))  MRSA PCR SCREENING     Status: Normal   Collection Time   01/02/12  6:41 PM      Component Value Range Status Comment   MRSA by PCR NEGATIVE  NEGATIVE Final      Studies: Dg Chest 2 View  01/02/2012  *RADIOLOGY REPORT*  Clinical Data: Short of breath  CHEST - 2 VIEW  Comparison: CT 07/02/2011, chest x-ray 12/08/2008  Findings: Heart size is upper normal.  Vascular congestion.  Patchy airspace disease appears similar to the prior CT and could represent mild edema versus chronic lung disease.  No confluent infiltrate or effusion.  No mass lesion.  IMPRESSION: Hazy lung density bilaterally, question pulmonary edema   Original Report Authenticated By: Camelia Phenes, M.D.     Scheduled Meds:    . albuterol  2.5 mg Nebulization Q6H  . antiseptic oral rinse  15 mL Mouth Rinse BID  . aspirin  81 mg Oral Daily  . enoxaparin (LOVENOX) injection  40 mg Subcutaneous Q24H  . furosemide  40 mg Intravenous BID  . hydrochlorothiazide  12.5 mg Oral Daily  . insulin aspart  0-20 Units Subcutaneous TID WC  . insulin aspart  0-5 Units Subcutaneous QHS  . ipratropium  0.5 mg Nebulization Q6H  . methylPREDNISolone (SOLU-MEDROL) injection  40 mg Intravenous Q8H  . moxifloxacin  400 mg Intravenous Q24H  . nicotine  21 mg Transdermal Daily  . pantoprazole  40 mg Oral Daily  . sodium chloride  3 mL Intravenous Q12H  . DISCONTD: aspirin  81 mg Oral Daily  . DISCONTD: furosemide  40 mg Intravenous BID  . DISCONTD: lisinopril  10 mg Oral Daily  . DISCONTD: lisinopril-hydrochlorothiazide  1 tablet Oral Daily  . DISCONTD: methylPREDNISolone (SOLU-MEDROL) injection  80 mg Intravenous Q8H   Continuous Infusions:   ________________________________________________________________________  Time spent: 35 min    [facility]  [facility] Pager [phone] If 8PM-8AM, please contact night-coverage at [email or web address], password [email or web address] 01/03/2012, 4:32 PM  LOS: 1 day

## 2012-01-04 ENCOUNTER — Inpatient Hospital Stay (HOSPITAL_COMMUNITY): Payer: Medicare Other

## 2012-01-04 DIAGNOSIS — J9600 Acute respiratory failure, unspecified whether with hypoxia or hypercapnia: Secondary | ICD-10-CM

## 2012-01-04 DIAGNOSIS — I509 Heart failure, unspecified: Secondary | ICD-10-CM

## 2012-01-04 LAB — CBC
HCT: 35.2 % — ABNORMAL LOW (ref 36.0–46.0)
Hemoglobin: 11.6 g/dL — ABNORMAL LOW (ref 12.0–15.0)
MCH: 30.4 pg (ref 26.0–34.0)
MCHC: 33 g/dL (ref 30.0–36.0)
MCV: 92.4 fL (ref 78.0–100.0)
Platelets: 444 10*3/uL — ABNORMAL HIGH (ref 150–400)
RBC: 3.81 MIL/uL — ABNORMAL LOW (ref 3.87–5.11)
RDW: 16.3 % — ABNORMAL HIGH (ref 11.5–15.5)
WBC: 14.3 10*3/uL — ABNORMAL HIGH (ref 4.0–10.5)

## 2012-01-04 LAB — BASIC METABOLIC PANEL
BUN: 23 mg/dL (ref 6–23)
CO2: 31 mEq/L (ref 19–32)
Calcium: 9.1 mg/dL (ref 8.4–10.5)
Chloride: 96 mEq/L (ref 96–112)
Creatinine, Ser: 1.07 mg/dL (ref 0.50–1.10)
GFR calc Af Amer: 67 mL/min — ABNORMAL LOW (ref >90)
GFR calc non Af Amer: 58 mL/min — ABNORMAL LOW (ref >90)
Glucose, Bld: 171 mg/dL — ABNORMAL HIGH (ref 70–99)
Potassium: 5.1 mEq/L (ref 3.5–5.1)
Sodium: 135 mEq/L (ref 135–145)

## 2012-01-04 LAB — GLUCOSE, CAPILLARY
Glucose-Capillary: 171 mg/dL — ABNORMAL HIGH (ref 70–99)
Glucose-Capillary: 217 mg/dL — ABNORMAL HIGH (ref 70–99)
Glucose-Capillary: 256 mg/dL — ABNORMAL HIGH (ref 70–99)
Glucose-Capillary: 83 mg/dL (ref 70–99)

## 2012-01-04 LAB — LIPID PANEL
Cholesterol: 188 mg/dL (ref 0–200)
HDL: 53 mg/dL (ref >39)
LDL Cholesterol: 111 mg/dL — ABNORMAL HIGH (ref 0–99)
Total CHOL/HDL Ratio: 3.5 RATIO
Triglycerides: 122 mg/dL (ref <150)
VLDL: 24 mg/dL (ref 0–40)

## 2012-01-04 LAB — URINE CULTURE
Colony Count: NO GROWTH
Culture: NO GROWTH

## 2012-01-04 MED ORDER — METHYLPREDNISOLONE SODIUM SUCC 40 MG IJ SOLR
40.0000 mg | Freq: Two times a day (BID) | INTRAMUSCULAR | Status: DC
  Administered 2012-01-05: 40 mg via INTRAVENOUS
  Filled 2012-01-04 (×3): qty 1

## 2012-01-04 MED ORDER — FUROSEMIDE 10 MG/ML IJ SOLN
40.0000 mg | Freq: Every day | INTRAMUSCULAR | Status: DC
  Administered 2012-01-05: 40 mg via INTRAVENOUS
  Filled 2012-01-04: qty 4

## 2012-01-04 MED ORDER — MONTELUKAST SODIUM 10 MG PO TABS
10.0000 mg | ORAL_TABLET | Freq: Every day | ORAL | Status: DC
  Administered 2012-01-04 – 2012-01-07 (×4): 10 mg via ORAL
  Filled 2012-01-04 (×5): qty 1

## 2012-01-04 NOTE — Clinical Documentation Improvement (Signed)
Abnormal Labs Clarification  THIS DOCUMENT IS NOT A PERMANENT PART OF THE MEDICAL RECORD  TO RESPOND TO THE THIS QUERY, FOLLOW THE INSTRUCTIONS BELOW:  1. If needed, update documentation for the patients encounter via the notes activity.  2. Access this query again and click edit on the [employer].  3. After updating, or not, click F2 to complete all highlighted (required) fields concerning your review. Select "additional documentation in the medical record" OR "no additional documentation provided".  4. Click Sign note button.  5. The deficiency will fall out of your InBasket *Please let us know if you are not able to complete this workflow by phone or e-mail (listed below).  Please update your documentation within the medical record to reflect your response to this query.                                                                                   01/04/12  Dear Dr.  Calvert Cantor  / [facility]  In a better effort to capture your patient's severity of illness, reflect appropriate length of stay and utilization of resources, a review of the medical record has revealed the following indicators.    Based on your clinical judgment, please clarify and document in a progress note and/or discharge summary the clinical condition associated with the following supporting information:  In responding to this query please exercise your independent judgment.  The fact that a query is asked, does not imply that any particular answer is desired or expected.  Abnormal findings (laboratory, x-ray, pathologic, and other diagnostic results) are not coded and reported unless the physician indicates their clinical significance.   The medical record reflects the following clinical findings, please clarify the diagnostic and/or clinical significance:      Possible Clinical Conditions?                                   Hyponatremia                                                                                                                       Other Condition___________________                  Cannot Clinically Determine_________      Supporting Information:  Risk Factors: Pulmonary edema, HTN  & COPD.   Labs: Component     Latest Ref Rng 01/02/2012 01/02/2012 01/02/2012 01/02/2012         2:22 PM  3:50 PM  6:41 PM  6:56 PM  Sodium     135 - 145 mEq/L 130 (L)  Component     Latest Ref Rng 01/03/2012 01/03/2012 01/03/2012 01/03/2012         7:26 AM  8:12 AM 12:28 PM  4:28 PM  Sodium     135 - 145 mEq/L 132 (L)      Component     Latest Ref Rng 01/03/2012 01/03/2012 01/04/2012         4:41 PM  9:46 PM   Sodium     135 - 145 mEq/L   135    Treatment: IVF    Reviewed:  no additional documentation provided, MD did not respond to query.    Thank You,  Shelda Pal RN, BSN, CCM   Clinical Documentation Specialist:  Pager [phone]  Health Information Management [place]

## 2012-01-04 NOTE — Consult Note (Signed)
Cardiology Consult Note   Patient ID: Debbie Mullins MRN: [id number], DOB/AGE: 17-Apr-1955   Admit date: 01/02/2012 Date of Consult: 01/04/2012  Primary Physician: [name], MD Primary Cardiologist: New to cardiology- seen in consultation by Dr. Dietrich Pates  Reason for consult: eval/management of CHF and abnl EKG  HPI: Debbie Mullins is a 56yo female with PMHx s/f DM, HTN, COPD, tobacco abuse, h/o lung nodules (stable, monitored) GERD and morbid obesity. She has a chronic leukocytosis, normocytic anemia and mild thrombocytosis for which she follows hem/onc. Peripheral blood flow cytometry in 8/13 to r/o CLL revealed nonspecific findings.   She reports worsening SOB and DOE x 2 weeks with assoc chest pressure. She noticed becoming more SOB climbing 2 flights of stairs (unusual for her), which progressed to dyspnea on minimal exertion (crossing a room). She noticed associated chest pressure during this time radiating to her L arm, relieved after 5-10 min of rest. She notes increased LEE, mildly increase weight gain (1-2 lbs) and cough productive of white sputum during this time. No palpitations or syncope. No fevers, chills, n/v/d. Reports good BP control. No elicit drug use. Occ EtOH. Continues to smoke 1.5 PPD. Mother passed of CHF.  She was admitted to [facility] for progressive SOB, DOE, wheezing and chest tightness attributed initially to ACOPD. She was started on nebs, IV steroids, abx and O2. CXR did reveal mild pulmonary edema (hazy bilateral lung density). pBNP was ordered and returned elevated at 6755. Trop-I neg x 3. A 2D echo was ordered revealing LVEF 60-65% with mild pHTN (PASP ). Started on Lasix IV. I/O - ~ 1.5 L. EKG on admission showed inf and anteroseptal TWIs, RAD, LAE and IVCD. Repeat EKG today shows deeper TWIs, evidence of LVH. Repeat CXR reveals improved perihilar haziness. No definite active process. U/A + nitrites, small leuks; no growth on Ur cx.   Problem  List: Past Medical History  Diagnosis Date  . Diabetes mellitus   . GERD (gastroesophageal reflux disease)   . Hypertension   . Depression   . Anemia   . Leukocytosis 02/09/2011  . Normocytic anemia 02/09/2011  . Thrombocytosis 02/09/2011  . COPD (chronic obstructive pulmonary disease) 02/09/2011  . Obesity 02/09/2011  . 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 Tobacco abuse 07/04/2011    Past Surgical History  Procedure Date  . Total knee arthroplasty bilateral    left oct. 2008, right feb. 2009     Allergies: No Known Allergies  Home Medications: Prior to Admission medications   Medication Sig Start Date End Date Taking? Authorizing Provider  acetaminophen (TYLENOL) 500 MG tablet Take 500 mg by mouth every 6 (six) hours as needed.   Yes Historical Provider, MD  albuterol (PROVENTIL HFA;VENTOLIN HFA) 108 (90 BASE) MCG/ACT inhaler Inhale 2 puffs into the lungs every 6 (six) hours as needed. For shortness of breath or wheezing   Yes Historical Provider, MD  aspirin 81 MG tablet Take 81 mg by mouth daily.   Yes Historical Provider, MD  esomeprazole (NEXIUM) 40 MG capsule Take 40 mg by mouth daily before breakfast.     Yes Historical Provider, MD  lisinopril-hydrochlorothiazide (PRINZIDE,ZESTORETIC) 10-12.5 MG per tablet Take 1 tablet by mouth daily.     Yes Historical Provider, MD  metFORMIN (GLUCOPHAGE) 500 MG tablet Take 500 mg by mouth 2 (two) times daily with a meal.     Yes Historical Provider, MD  montelukast (SINGULAIR) 10 MG tablet Take 10 mg by mouth at bedtime.   Yes  Historical Provider, MD  naproxen (NAPROSYN) 500 MG tablet Take 500 mg by mouth 2 (two) times daily with a meal.   Yes Historical Provider, MD  albuterol (PROVENTIL,VENTOLIN) 90 MCG/ACT inhaler Inhale 2 puffs into the lungs every 6 (six) hours as needed for wheezing. 01/02/11 12/28/11  Randall An, MD    Inpatient Medications:     . albuterol  2.5 mg Nebulization Q6H  . antiseptic oral rinse  15 mL Mouth  Rinse BID  . aspirin  81 mg Oral Daily  . enoxaparin (LOVENOX) injection  40 mg Subcutaneous Q24H  . furosemide  40 mg Intravenous BID  . hydrochlorothiazide  12.5 mg Oral Daily  . insulin aspart  0-20 Units Subcutaneous TID WC  . insulin aspart  0-5 Units Subcutaneous QHS  . ipratropium  0.5 mg Nebulization Q6H  . methylPREDNISolone (SOLU-MEDROL) injection  40 mg Intravenous Q8H  . moxifloxacin  400 mg Intravenous Q24H  . nicotine  21 mg Transdermal Daily  . pantoprazole  40 mg Oral Daily  . sodium chloride  3 mL Intravenous Q12H  . DISCONTD: furosemide  40 mg Intravenous BID   Prescriptions prior to admission  Medication Sig Dispense Refill  . acetaminophen (TYLENOL) 500 MG tablet Take 500 mg by mouth every 6 (six) hours as needed.      Marland Kitchen albuterol (PROVENTIL HFA;VENTOLIN HFA) 108 (90 BASE) MCG/ACT inhaler Inhale 2 puffs into the lungs every 6 (six) hours as needed. For shortness of breath or wheezing      . aspirin 81 MG tablet Take 81 mg by mouth daily.      Marland Kitchen esomeprazole (NEXIUM) 40 MG capsule Take 40 mg by mouth daily before breakfast.        . lisinopril-hydrochlorothiazide (PRINZIDE,ZESTORETIC) 10-12.5 MG per tablet Take 1 tablet by mouth daily.        . metFORMIN (GLUCOPHAGE) 500 MG tablet Take 500 mg by mouth 2 (two) times daily with a meal.        . montelukast (SINGULAIR) 10 MG tablet Take 10 mg by mouth at bedtime.      . naproxen (NAPROSYN) 500 MG tablet Take 500 mg by mouth 2 (two) times daily with a meal.      . albuterol (PROVENTIL,VENTOLIN) 90 MCG/ACT inhaler Inhale 2 puffs into the lungs every 6 (six) hours as needed for wheezing.  17 g  12    Family History  Problem Relation Age of Onset  . Heart failure Mother   . Cancer Maternal Grandmother      History   Social History  . Marital Status: Single    Spouse Name: N/A    Number of Children: N/A  . Years of Education: N/A   Occupational History  . Not on file.   Social History Main Topics  . Smoking  status: Current Every Day Smoker -- 1.5 packs/day for 20 years    Types: Cigarettes  . Smokeless tobacco: Never Used  . Alcohol Use: 0.6 oz/week    1 Glasses of wine per week     once a month  . Drug Use: No  . Sexually Active: No   Other Topics Concern  . Not on file   Social History Narrative  . No narrative on file     Review of Systems: General: negative for chills, fever, night sweats or weight changes.  Cardiovascular: positive for chest pain, dyspnea on exertion, edema, shortness of breath, negative for orthopnea, palpitations, paroxysmal nocturnal dyspnea  Dermatological: negative  for rash Respiratory: positive for cough or wheezing Urologic: negative for hematuria Abdominal: negative for nausea, vomiting, diarrhea, bright red blood per rectum, melena, or hematemesis Neurologic:  negative for visual changes, syncope, or dizziness All other systems reviewed and are otherwise negative except as noted above.  Physical Exam: Blood pressure 123/57, pulse 69, temperature 97.5 F (36.4 C), temperature source Oral, resp. rate 16, height 5\' 3"  (1.6 m), weight 106.5 kg (234 lb 12.6 oz), last menstrual period 07/03/2011, SpO2 95.00%.    General: Obese, well developed, in no acute distress. Head: Normocephalic, atraumatic, sclera non-icteric, no xanthomas, nares are without discharge.  Neck: Negative for carotid bruits. JVD difficult to appreciate due to redundant neck tissue.  Lungs: Centralized exp wheezing, bilateral rales. Breathing is unlabored. Heart: RRR with S1 S2. No murmurs, rubs, or gallops appreciated. Abdomen: Soft, non-tender, non-distended with normoactive bowel sounds. No hepatomegaly. No rebound/guarding. No obvious abdominal masses. Msk:  Strength and tone appears normal for age. Extremities: Trace bilateral nonpitting pretibial edema. No clubbing or cyanosis.  Distal pedal pulses are 2+ and equal bilaterally. Neuro: Alert and oriented X 3. Moves all extremities  spontaneously. Psych:  Responds to questions appropriately with a normal affect.  Labs: Recent Labs  Basename 01/04/12 0425 01/03/12 0726   WBC 14.3* 9.3   HGB 11.6* 11.5*   HCT 35.2* 34.8*   MCV 92.4 93.3   PLT 444* 429*   Lab 01/04/12 0425 01/03/12 1641 01/03/12 0726 01/02/12 1856 01/02/12 1422  NA 135 -- 132* -- 130*  K 5.1 5.3* 5.2* -- --  CL 96 -- 96 -- 95*  CO2 31 -- 26 -- 23  BUN 23 -- 22 -- 26*  CREATININE 1.07 -- 0.99 1.17* --  CALCIUM 9.1 -- 8.9 -- 8.6  PROT -- -- -- -- 7.8  BILITOT -- -- -- -- 0.2*  ALKPHOS -- -- -- -- 136*  ALT -- -- -- -- 11  AST -- -- -- -- 12  AMYLASE -- -- -- -- --  LIPASE -- -- -- -- --  GLUCOSE 171* -- 164* -- 144*   Recent Labs  Basename 01/02/12 1856   HGBA1C 6.6*   Recent Labs  Basename 01/03/12 0726 01/03/12 0107 01/02/12 1856   CKTOTAL -- -- --   CKMB -- -- --   CKMBINDEX -- -- --   TROPONINI <0.30 <0.30 <0.30   Radiology/Studies: Dg Chest 2 View  01/04/2012  *RADIOLOGY REPORT*  Clinical Data: Shortness of breath, cough, intermittent fever  CHEST - 2 VIEW  Comparison: Chest x-ray of 01/02/2012  Findings: The lungs remain suboptimally aerated.  The perihilar haziness has improved somewhat.  No focal infiltrate or effusion is seen.  The heart is within upper limits of normal.  No bony abnormality is noted.  IMPRESSION: Improvement in perihilar haziness.  No definite active process. Suboptimal aeration.   Original Report Authenticated By: Juline Patch, M.D.    Dg Chest 2 View  01/02/2012  *RADIOLOGY REPORT*  Clinical Data: Short of breath  CHEST - 2 VIEW  Comparison: CT 07/02/2011, chest x-ray 12/08/2008  Findings: Heart size is upper normal.  Vascular congestion.  Patchy airspace disease appears similar to the prior CT and could represent mild edema versus chronic lung disease.  No confluent infiltrate or effusion.  No mass lesion.  IMPRESSION: Hazy lung density bilaterally, question pulmonary edema   Original Report Authenticated  By: Camelia Phenes, M.D.    EKG:  01/04/12: NSR, 67 bpm, TWIs V1-V4,  II, III, aVF, LVH, no ST changes 01/02/12: NSR, 99 bpm, RAD, IVCD, TWIs V1-V5, LAE, no ST changes  ASSESSMENT:   1. Acute hypoxic respiratory failure 2. ACOPD 3. Acute unspecified CHF, likely diastolic 4. DM 5. HTN 6. Tobacco abuse 7. Morbid obesity 8. H/o lung nodules, stable  DISCUSSION/PLAN:   Patient presents with 2 weeks of worsening DOE, new cough productive of white sputum, LEE and weight gain. She also endorses associated SSCP radiating to her L arm lasting 5-10 minutes and relieving w/ rest. Prior chest CTs for lung nodules reveal advanced CAD. Concern with chest pain is for underlying ischemic etiology. Symptoms improving daily. No further chest pain. No acute cardiac event noted since admission. EKG shows T wave inversion diffusely, more prominent on 10/24.. Trop-I neg x 3. CXR this AM shows improved pulm edema, however lung exam reveals persistent bilateral rales and expiratory wheezing.   Patient  has significant RFs in HTN, DM, tobacco abuse and morbid obesity. CT of chest again shows signif CAD 1 year ago. Would recommend L and R heart cath on Monday to define anatomy, pressures. In the meantime, continue one additional day of Lasix IV with monitored output. Continued management of COPD  Stressed risk factor reduction in tobacco cessation, diet & exercise, BP and glycemic control. Add low-dose BB. Check lipid panel.     Signed, R. Hurman Horn, PA-C 01/04/2012, 1:26 PM  Patient seen and examined.  Agree with findingas as noted above Patient is a 56 yr old with long history of tob use, hx of DM.   Presents with worsening SOB, edema, SSCP Being diuresed and treated for COPD exacerbation EKG yesterday with changes worrisome for ischemia.  Patient has ruled out I have reviewed echo.  Overall LVEF is normal  There is mild diastolic dysfunction.  I am sure it is worse when she was hypoxic  RV also  appears mild enlarged What is signif if CT from 2012 that shows signif vascular disease heart and aorta  On exam, she still has evid of increased vol  Lungs with wheezes and rales  Cardiac exam with RRR.  No S3  Ext without significant edema  As noted above would continue to Rx pulm and treat for volume increase.  Would rec R and L heart cath on Monday  Discussed risks and benefits with patient who agrees to proceed  Check lipids  She should be on a statin  Counselledon tobacco cessation.  Dietrich Pates

## 2012-01-05 DIAGNOSIS — I5032 Chronic diastolic (congestive) heart failure: Secondary | ICD-10-CM

## 2012-01-05 DIAGNOSIS — I5033 Acute on chronic diastolic (congestive) heart failure: Secondary | ICD-10-CM

## 2012-01-05 DIAGNOSIS — J441 Chronic obstructive pulmonary disease with (acute) exacerbation: Secondary | ICD-10-CM

## 2012-01-05 LAB — CBC
HCT: 36.6 % (ref 36.0–46.0)
Hemoglobin: 12.1 g/dL (ref 12.0–15.0)
MCH: 30.6 pg (ref 26.0–34.0)
MCHC: 33.1 g/dL (ref 30.0–36.0)
MCV: 92.7 fL (ref 78.0–100.0)
Platelets: 446 10*3/uL — ABNORMAL HIGH (ref 150–400)
RBC: 3.95 MIL/uL (ref 3.87–5.11)
RDW: 16.6 % — ABNORMAL HIGH (ref 11.5–15.5)
WBC: 15 10*3/uL — ABNORMAL HIGH (ref 4.0–10.5)

## 2012-01-05 LAB — BASIC METABOLIC PANEL
BUN: 26 mg/dL — ABNORMAL HIGH (ref 6–23)
CO2: 35 mEq/L — ABNORMAL HIGH (ref 19–32)
Calcium: 8.9 mg/dL (ref 8.4–10.5)
Chloride: 95 mEq/L — ABNORMAL LOW (ref 96–112)
Creatinine, Ser: 1.07 mg/dL (ref 0.50–1.10)
GFR calc Af Amer: 67 mL/min — ABNORMAL LOW (ref >90)
GFR calc non Af Amer: 58 mL/min — ABNORMAL LOW (ref >90)
Glucose, Bld: 133 mg/dL — ABNORMAL HIGH (ref 70–99)
Potassium: 4.5 mEq/L (ref 3.5–5.1)
Sodium: 134 mEq/L — ABNORMAL LOW (ref 135–145)

## 2012-01-05 LAB — GLUCOSE, CAPILLARY
Glucose-Capillary: 137 mg/dL — ABNORMAL HIGH (ref 70–99)
Glucose-Capillary: 274 mg/dL — ABNORMAL HIGH (ref 70–99)
Glucose-Capillary: 66 mg/dL — ABNORMAL LOW (ref 70–99)
Glucose-Capillary: 180 mg/dL — ABNORMAL HIGH (ref 70–99)
Glucose-Capillary: 104 mg/dL — ABNORMAL HIGH (ref 70–99)

## 2012-01-05 LAB — PRO B NATRIURETIC PEPTIDE: Pro B Natriuretic peptide (BNP): 586.7 pg/mL — ABNORMAL HIGH (ref 0–125)

## 2012-01-05 MED ORDER — FUROSEMIDE 40 MG PO TABS
40.0000 mg | ORAL_TABLET | Freq: Every day | ORAL | Status: DC
  Administered 2012-01-06: 40 mg via ORAL
  Filled 2012-01-05: qty 1

## 2012-01-05 MED ORDER — HYDRALAZINE HCL 20 MG/ML IJ SOLN
10.0000 mg | Freq: Four times a day (QID) | INTRAMUSCULAR | Status: DC | PRN
  Filled 2012-01-05: qty 0.5

## 2012-01-05 MED ORDER — ATORVASTATIN CALCIUM 20 MG PO TABS
20.0000 mg | ORAL_TABLET | Freq: Every day | ORAL | Status: DC
  Administered 2012-01-05 – 2012-01-07 (×3): 20 mg via ORAL
  Filled 2012-01-05 (×4): qty 1

## 2012-01-05 MED ORDER — PREDNISONE 20 MG PO TABS
40.0000 mg | ORAL_TABLET | Freq: Every day | ORAL | Status: DC
  Administered 2012-01-06 – 2012-01-07 (×2): 40 mg via ORAL
  Filled 2012-01-05 (×3): qty 2

## 2012-01-05 NOTE — Plan of Care (Signed)
Problem: Phase III Progression Outcomes Goal: Pain controlled on oral analgesia Outcome: Completed/Met Date Met:  01/05/12 See eMAR

## 2012-01-05 NOTE — Progress Notes (Signed)
Patient Name: Debbie Mullins      SUBJECTIVE: seen 10/25 for progressive sob and found to have CHF and abnormal ECG.  Has hx of HTN, DM, COPD and morbid obesity with chronic leukocytosis and annemia for which she is followed by heme onc  Echo Nl LVEF; Tn -  CT 2012 showed some vascular disease and plan in R and L HC on Monday  Feels better with less sob today  Past Medical History  Diagnosis Date  . Diabetes mellitus   . GERD (gastroesophageal reflux disease)   . Hypertension   . Depression   . Anemia   . Leukocytosis 02/09/2011  . Normocytic anemia 02/09/2011  . Thrombocytosis 02/09/2011  . COPD (chronic obstructive pulmonary disease) 02/09/2011  . Obesity 02/09/2011  . 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 Tobacco abuse 07/04/2011    PHYSICAL EXAM Filed Vitals:   01/05/12 0745 01/05/12 0930 01/05/12 1000 01/05/12 1005  BP:      Pulse:      Temp:      TempSrc:      Resp:      Height:      Weight:      SpO2: 96% 93% 84% 94%   BP 145/79  Pulse 102  Temp 98.3 F (36.8 C) (Oral)  Resp 22  Ht 5\' 3"  (1.6 m)  Wt 231 lb 9.6 oz (105.053 kg)  BMI 41.03 kg/m2  SpO2 94%  LMP 07/03/2011  Well developed and nourished in no acute distress HENT normal edentulous Neck supple with JVP-flat Decreased breath sunds with prolonged expiratory phase Regular rate and rhythm, no murmurs or gallops Abd-soft with active BS without hepatomegaly No Clubbing cyanosis  Tr edema Skin-warm and dry A & Oriented  Grossly normal sensory and motor function       Intake/Output Summary (Last 24 hours) at 01/05/12 1204 Last data filed at 01/04/12 2300  Gross per 24 hour  Intake    730 ml  Output   1150 ml  Net   -420 ml    LABS: Basic Metabolic Panel:  Lab 01/05/12 1610 01/04/12 0425 01/03/12 1641 01/03/12 0726 01/02/12 1856 01/02/12 1422  NA 134* 135 -- 132* -- 130*  K 4.5 5.1 5.3* 5.2* -- 4.9  CL 95* 96 -- 96 -- 95*  CO2 35* 31 -- 26 -- 23  GLUCOSE 133* 171* -- 164* --  144*  BUN 26* 23 -- 22 -- 26*  CREATININE 1.07 1.07 -- 0.99 1.17* 1.14*  CALCIUM 8.9 9.1 -- -- -- --  MG -- -- -- -- -- --  PHOS -- -- -- -- -- --   Cardiac Enzymes:  Basename 01/03/12 0726 01/03/12 0107 01/02/12 1856  CKTOTAL -- -- --  CKMB -- -- --  CKMBINDEX -- -- --  TROPONINI <0.30 <0.30 <0.30   CBC:  Lab 01/05/12 0640 01/04/12 0425 01/03/12 0726 01/02/12 1856 01/02/12 1422  WBC 15.0* 14.3* 9.3 15.6* 14.3*  NEUTROABS -- -- -- -- --  HGB 12.1 11.6* 11.5* 12.3 12.5  HCT 36.6 35.2* 34.8* 37.1 37.7  MCV 92.7 92.4 93.3 94.6 93.1  PLT 446* 444* 429* 477* 446*   PROTIME: No results found for this basename: LABPROT:3,INR:3 in the last 72 hours Liver Function Tests:  Basename 01/02/12 1422  AST 12  ALT 11  ALKPHOS 136*  BILITOT 0.2*  PROT 7.8  ALBUMIN 3.4*   No results found for this basename: LIPASE:2,AMYLASE:2 in the last 72 hours BNP: BNP (last 3 results)  Basename 01/05/12 0640 01/02/12 1550  PROBNP 586.7* 6755.0*   D-Dimer: No results found for this basename: DDIMER:2 in the last 72 hours Hemoglobin A1C:  Basename 01/02/12 1856  HGBA1C 6.6*   Fasting Lipid Panel:  Basename 01/04/12 1625  CHOL 188  HDL 53  LDLCALC 111*  TRIG 122  CHOLHDL 3.5  LDLDIRECT --    ASSESSMENT AND PLAN:  Patient Active Hospital Problem List:  Obesity (02/09/2011)  * Tobacco abuse (07/04/2011)  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 Diuretic reltated  Will change lasix IV >>PO Reviewed smoking cessation stragegies On for Cath  Mon am   Signed, Sherryl Manges MD  01/05/2012

## 2012-01-05 NOTE — Progress Notes (Addendum)
PATIENT DETAILS Name: Debbie Mullins Age: 56 y.o. Sex: female Date of Birth: 1956-02-02 Admit Date: 01/02/2012 Admitting Physician Hollice Espy, MD WUJ:[id number],YNWGN J, MD  Subjective: Shortness of breath much better today than on admission  Assessment/Plan: Principal Problem:  *Acute respiratory failure with hypoxia -Multifactorial with a combination of acute COPD with exacerbation and acute diastolic heart failure -Better with treatment of the above conditions. -Titrate off oxygen - will need a ambulatory O2 saturation prior to discharge  Active Problems:  Acute exacerbation of COPD -Better -Continue with nebulized bronchodilators -Change Solu-Medrol to prednisone -Continue with empiric Avelox -Incentive spirometry and flutter valve as needed  Acute diastolic CHF -Diuretics as tolerated-however will stop hydrochlorothiazide as patient now on furosemide -Slowly add beta blockers with bronchospasm better -Appreciate cardiology input  Exertional dyspnea/exertional chest pain with EKG changes -Cardiology planning for a left heart catheterization on 01/07/2012 -Cardiac enzymes negative -Continue with aspirin  Mild hyperkalemia -Resolved -Lisinopril on hold for now  Diabetes -CBGs relatively well controlled -Continue with SSI -Metformin on hold -A1c-6.6  Hypertension -Moderate control  -Will stop HCTZ and just continue on furosemide -Add beta blockers when bronchospasm better  Dyslipidemia -LDL more than 100, diabetic patient-start statins  Chronic leukocytosis/thrombocytosis -Outpatient oncology followup   Obesity -Counseled-regarding importance of weight loss   Tobacco abuse -Counseled extensively  -Continue transdermal nicotine   Disposition: Remain inpatient  DVT Prophylaxis: Prophylactic Lovenox   Code Status: Full code   Procedures:  None   CONSULTS:  cardiology  PHYSICAL EXAM: Vital signs in last 24 hours: Filed Vitals:   01/04/12 1553 01/04/12 2130 01/05/12 0451 01/05/12 0745  BP: 140/82 126/79 145/79   Pulse: 100 83 102   Temp: 97.6 F (36.4 C) 98.2 F (36.8 C) 98.3 F (36.8 C)   TempSrc: Oral Oral Oral   Resp: 18 18 22    Height: 5\' 3"  (1.6 m)     Weight: 105.053 kg (231 lb 9.6 oz)     SpO2: 92% 97% 92% 96%    Weight change:  Body mass index is 41.03 kg/(m^2).   Gen Exam: Awake and alert with clear speech.   Neck: Supple, No JVD.   Chest: B/L good air entry, some scattered rhonchi.   CVS: S1 S2 Regular, no murmurs.  Abdomen: soft, BS +, non tender, non distended.  Extremities: no edema, lower extremities warm to touch. Neurologic: Non Focal.   Skin: No Rash.   Wounds: N/A.    Intake/Output from previous day:  Intake/Output Summary (Last 24 hours) at 01/05/12 1024 Last data filed at 01/04/12 2300  Gross per 24 hour  Intake    730 ml  Output   1150 ml  Net   -420 ml     LAB RESULTS: CBC  Lab 01/05/12 0640 01/04/12 0425 01/03/12 0726 01/02/12 1856 01/02/12 1422  WBC 15.0* 14.3* 9.3 15.6* 14.3*  HGB 12.1 11.6* 11.5* 12.3 12.5  HCT 36.6 35.2* 34.8* 37.1 37.7  PLT 446* 444* 429* 477* 446*  MCV 92.7 92.4 93.3 94.6 93.1  MCH 30.6 30.4 30.8 31.4 30.9  MCHC 33.1 33.0 33.0 33.2 33.2  RDW 16.6* 16.3* 16.1* 16.5* 16.5*  LYMPHSABS -- -- -- -- --  MONOABS -- -- -- -- --  EOSABS -- -- -- -- --  BASOSABS -- -- -- -- --  BANDABS -- -- -- -- --    Chemistries   Lab 01/05/12 0640 01/04/12 0425 01/03/12 1641 01/03/12 0726 01/02/12 1856 01/02/12 1422  NA 134* 135 --  132* -- 130*  K 4.5 5.1 5.3* 5.2* -- 4.9  CL 95* 96 -- 96 -- 95*  CO2 35* 31 -- 26 -- 23  GLUCOSE 133* 171* -- 164* -- 144*  BUN 26* 23 -- 22 -- 26*  CREATININE 1.07 1.07 -- 0.99 1.17* 1.14*  CALCIUM 8.9 9.1 -- 8.9 -- 8.6  MG -- -- -- -- -- --    CBG:  Lab 01/05/12 0748 01/04/12 2141 01/04/12 1751 01/04/12 1137 01/04/12 0816  GLUCAP 137* 83 256* 217* 171*    GFR Estimated Creatinine Clearance: 69.7 ml/min (by C-G  formula based on Cr of 1.07).  Coagulation profile No results found for this basename: INR:5,PROTIME:5 in the last 168 hours  Cardiac Enzymes  Lab 01/03/12 0726 01/03/12 0107 01/02/12 1856  CKMB -- -- --  TROPONINI <0.30 <0.30 <0.30  MYOGLOBIN -- -- --    No components found with this basename: POCBNP:3 No results found for this basename: DDIMER:2 in the last 72 hours  Basename 01/02/12 1856  HGBA1C 6.6*    Basename 01/04/12 1625  CHOL 188  HDL 53  LDLCALC 111*  TRIG 122  CHOLHDL 3.5  LDLDIRECT --   No results found for this basename: TSH,T4TOTAL,FREET3,T3FREE,THYROIDAB in the last 72 hours No results found for this basename: VITAMINB12:2,FOLATE:2,FERRITIN:2,TIBC:2,IRON:2,RETICCTPCT:2 in the last 72 hours No results found for this basename: LIPASE:2,AMYLASE:2 in the last 72 hours  Urine Studies No results found for this basename: UACOL:2,UAPR:2,USPG:2,UPH:2,UTP:2,UGL:2,UKET:2,UBIL:2,UHGB:2,UNIT:2,UROB:2,ULEU:2,UEPI:2,UWBC:2,URBC:2,UBAC:2,CAST:2,CRYS:2,UCOM:2,BILUA:2 in the last 72 hours  MICROBIOLOGY: Recent Results (from the past 240 hour(s))  MRSA PCR SCREENING     Status: Normal   Collection Time   01/02/12  6:41 PM      Component Value Range Status Comment   MRSA by PCR NEGATIVE  NEGATIVE Final   URINE CULTURE     Status: Normal   Collection Time   01/03/12 12:08 AM      Component Value Range Status Comment   Specimen Description URINE, RANDOM   Final    Special Requests CX ADDED AT 0029   Final    Culture  Setup Time 01/03/2012 00:41   Final    Colony Count NO GROWTH   Final    Culture NO GROWTH   Final    Report Status 01/04/2012 FINAL   Final     RADIOLOGY STUDIES/RESULTS: Dg Chest 2 View  01/04/2012  *RADIOLOGY REPORT*  Clinical Data: Shortness of breath, cough, intermittent fever  CHEST - 2 VIEW  Comparison: Chest x-ray of 01/02/2012  Findings: The lungs remain suboptimally aerated.  The perihilar haziness has improved somewhat.  No focal infiltrate  or effusion is seen.  The heart is within upper limits of normal.  No bony abnormality is noted.  IMPRESSION: Improvement in perihilar haziness.  No definite active process. Suboptimal aeration.   Original Report Authenticated By: Juline Patch, M.D.    Dg Chest 2 View  01/02/2012  *RADIOLOGY REPORT*  Clinical Data: Short of breath  CHEST - 2 VIEW  Comparison: CT 07/02/2011, chest x-ray 12/08/2008  Findings: Heart size is upper normal.  Vascular congestion.  Patchy airspace disease appears similar to the prior CT and could represent mild edema versus chronic lung disease.  No confluent infiltrate or effusion.  No mass lesion.  IMPRESSION: Hazy lung density bilaterally, question pulmonary edema   Original Report Authenticated By: Camelia Phenes, M.D.     MEDICATIONS: Scheduled Meds:   . albuterol  2.5 mg Nebulization Q6H  . antiseptic oral rinse  15 mL Mouth Rinse BID  . aspirin  81 mg Oral Daily  . enoxaparin (LOVENOX) injection  40 mg Subcutaneous Q24H  . furosemide  40 mg Intravenous Daily  . hydrochlorothiazide  12.5 mg Oral Daily  . insulin aspart  0-20 Units Subcutaneous TID WC  . insulin aspart  0-5 Units Subcutaneous QHS  . ipratropium  0.5 mg Nebulization Q6H  . methylPREDNISolone (SOLU-MEDROL) injection  40 mg Intravenous Q12H  . montelukast  10 mg Oral QHS  . moxifloxacin  400 mg Intravenous Q24H  . nicotine  21 mg Transdermal Daily  . pantoprazole  40 mg Oral Daily  . sodium chloride  3 mL Intravenous Q12H  . DISCONTD: furosemide  40 mg Intravenous BID  . DISCONTD: methylPREDNISolone (SOLU-MEDROL) injection  40 mg Intravenous Q8H   Continuous Infusions:  PRN Meds:.acetaminophen, acetaminophen, albuterol, guaiFENesin-dextromethorphan, morphine injection, oxyCODONE  Antibiotics: Anti-infectives     Start     Dose/Rate Route Frequency Ordered Stop   01/02/12 1900   moxifloxacin (AVELOX) IVPB 400 mg        400 mg 250 mL/hr over 60 Minutes Intravenous Every 24 hours  01/02/12 Elson Areas, MD  [facility] Pager:[phone]  If 7PM-7AM, please contact night-coverage [email or web address] Password [email or web address] 01/05/2012, 10:24 AM   LOS: 3 days

## 2012-01-06 DIAGNOSIS — I5033 Acute on chronic diastolic (congestive) heart failure: Principal | ICD-10-CM

## 2012-01-06 LAB — GLUCOSE, CAPILLARY
Glucose-Capillary: 135 mg/dL — ABNORMAL HIGH (ref 70–99)
Glucose-Capillary: 190 mg/dL — ABNORMAL HIGH (ref 70–99)
Glucose-Capillary: 146 mg/dL — ABNORMAL HIGH (ref 70–99)
Glucose-Capillary: 137 mg/dL — ABNORMAL HIGH (ref 70–99)

## 2012-01-06 LAB — BASIC METABOLIC PANEL
BUN: 24 mg/dL — ABNORMAL HIGH (ref 6–23)
CO2: 37 mEq/L — ABNORMAL HIGH (ref 19–32)
Calcium: 8.9 mg/dL (ref 8.4–10.5)
Chloride: 91 mEq/L — ABNORMAL LOW (ref 96–112)
Creatinine, Ser: 1.03 mg/dL (ref 0.50–1.10)
GFR calc Af Amer: 70 mL/min — ABNORMAL LOW (ref >90)
GFR calc non Af Amer: 60 mL/min — ABNORMAL LOW (ref >90)
Glucose, Bld: 114 mg/dL — ABNORMAL HIGH (ref 70–99)
Potassium: 3.8 mEq/L (ref 3.5–5.1)
Sodium: 133 mEq/L — ABNORMAL LOW (ref 135–145)

## 2012-01-06 MED ORDER — SODIUM CHLORIDE 0.9 % IV SOLN
1.0000 mL/kg/h | INTRAVENOUS | Status: DC
  Administered 2012-01-07: 1.002 mL/kg/h via INTRAVENOUS

## 2012-01-06 MED ORDER — POTASSIUM CHLORIDE CRYS ER 20 MEQ PO TBCR
40.0000 meq | EXTENDED_RELEASE_TABLET | Freq: Two times a day (BID) | ORAL | Status: AC
  Administered 2012-01-06 (×2): 40 meq via ORAL
  Filled 2012-01-06 (×2): qty 2

## 2012-01-06 MED ORDER — MOXIFLOXACIN HCL 400 MG PO TABS
400.0000 mg | ORAL_TABLET | Freq: Every day | ORAL | Status: DC
  Administered 2012-01-06 – 2012-01-07 (×2): 400 mg via ORAL
  Filled 2012-01-06 (×4): qty 1

## 2012-01-06 MED ORDER — LISINOPRIL 5 MG PO TABS
5.0000 mg | ORAL_TABLET | Freq: Every day | ORAL | Status: DC
  Administered 2012-01-06 – 2012-01-07 (×2): 5 mg via ORAL
  Filled 2012-01-06 (×3): qty 1

## 2012-01-06 MED ORDER — IPRATROPIUM-ALBUTEROL 20-100 MCG/ACT IN AERS
1.0000 | INHALATION_SPRAY | Freq: Three times a day (TID) | RESPIRATORY_TRACT | Status: DC
  Administered 2012-01-06 – 2012-01-07 (×4): 1 via RESPIRATORY_TRACT
  Filled 2012-01-06: qty 4

## 2012-01-06 MED ORDER — SODIUM CHLORIDE 0.9 % IV SOLN: INTRAVENOUS | Status: DC

## 2012-01-06 MED ORDER — DIAZEPAM 5 MG PO TABS
5.0000 mg | ORAL_TABLET | ORAL | Status: AC
  Administered 2012-01-07: 5 mg via ORAL
  Filled 2012-01-06: qty 1

## 2012-01-06 MED ORDER — DIAZEPAM 5 MG PO TABS: 5.0000 mg | ORAL_TABLET | ORAL | Status: DC

## 2012-01-06 NOTE — Progress Notes (Signed)
 Patient Name: Debbie Mullins      SUBJECTIVE: seen 10/25 for progressive sob and found to have CHF and abnormal ECG.  Has hx of HTN, DM, COPD and morbid obesity with chronic leukocytosis and annemia for which she is followed by heme onc  Echo Nl LVEF; Tn -  CT 2012 showed some vascular disease and plan in [facility] on Monday  Feels good today  Past Medical History  Diagnosis Date  . Diabetes mellitus   . GERD (gastroesophageal reflux disease)   . Hypertension   . Depression   . Anemia   . Leukocytosis 02/09/2011  . Normocytic anemia 02/09/2011  . Thrombocytosis 02/09/2011  . COPD (chronic obstructive pulmonary disease) 02/09/2011  . Obesity 02/09/2011  . 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 Tobacco abuse 07/04/2011    PHYSICAL EXAM Filed Vitals:   01/05/12 2032 01/05/12 2154 01/06/12 0453 01/06/12 0857  BP: 155/76  126/77   Pulse: 86  108   Temp: 97.9 F (36.6 C)  98 F (36.7 C)   TempSrc: Oral  Oral   Resp: 18  18   Height:      Weight:   235 lb 6.4 oz (106.777 kg)   SpO2: 91% 92% 90% 93%   BP 126/77  Pulse 108  Temp 98 F (36.7 C) (Oral)  Resp 18  Ht 5' 3" (1.6 m)  Wt 235 lb 6.4 oz (106.777 kg)  BMI 41.70 kg/m2  SpO2 93%  LMP 07/03/2011  Well developed and nourished in no acute distress HENT normal edentulous Neck supple with JVP-flat Decreased breath sunds with prolonged expiratory phase Regular rate and rhythm, no murmurs or gallops Abd-soft with active BS without hepatomegaly No Clubbing cyanosis  Tr edema Skin-warm and dry A & Oriented  Grossly normal sensory and motor function       Intake/Output Summary (Last 24 hours) at 01/06/12 1036 Last data filed at 01/05/12 2148  Gross per 24 hour  Intake    253 ml  Output      0 ml  Net    253 ml    LABS: Basic Metabolic Panel:  Lab 01/06/12 0600 01/05/12 0640 01/04/12 0425 01/03/12 1641 01/03/12 0726 01/02/12 1856 01/02/12 1422  NA 133* 134* 135 -- 132* -- 130*  K 3.8 4.5 5.1 5.3*  5.2* -- 4.9  CL 91* 95* 96 -- 96 -- 95*  CO2 37* 35* 31 -- 26 -- 23  GLUCOSE 114* 133* 171* -- 164* -- 144*  BUN 24* 26* 23 -- 22 -- 26*  CREATININE 1.03 1.07 1.07 -- 0.99 1.17* 1.14*  CALCIUM 8.9 8.9 -- -- -- -- --  MG -- -- -- -- -- -- --  PHOS -- -- -- -- -- -- --   Cardiac Enzymes: No results found for this basename: CKTOTAL:3,CKMB:3,CKMBINDEX:3,TROPONINI:3 in the last 72 hours CBC:  Lab 01/05/12 0640 01/04/12 0425 01/03/12 0726 01/02/12 1856 01/02/12 1422  WBC 15.0* 14.3* 9.3 15.6* 14.3*  NEUTROABS -- -- -- -- --  HGB 12.1 11.6* 11.5* 12.3 12.5  HCT 36.6 35.2* 34.8* 37.1 37.7  MCV 92.7 92.4 93.3 94.6 93.1  PLT 446* 444* 429* 477* 446*    Basename 01/05/12 0640 01/02/12 1550  PROBNP 586.7* 6755.0*   D-Dimer: No results found for this basename: DDIMER:2 in the last 72 hours Hemoglobin A1C: No results found for this basename: HGBA1C in the last 72 hours Fasting Lipid Panel:  Basename 01/04/12 1625  CHOL 188  HDL   53  LDLCALC 111*  TRIG 122  CHOLHDL 3.5  LDLDIRECT --    ASSESSMENT AND PLAN:  Patient Active Hospital Problem List:  Obesity (02/09/2011)  COPD exacerabation-- TAPERING off prednisone  * Tobacco abuse (07/04/2011)  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 Diuretic reltated  Will hold lasix as cath in am and give KCL for repletion Reviewed smoking cessation stragegies On for Cath  Mon am orders written   Signed, Lilyanna Lunt MD  01/06/2012   

## 2012-01-06 NOTE — Progress Notes (Signed)
PATIENT DETAILS Name: Debbie Mullins Age: 56 y.o. Sex: female Date of Birth: Dec 22, 1955 Admit Date: 01/02/2012 Admitting Physician Hollice Espy, MD ZOX:[id number],VWUJW J, MD  Subjective: Shortness of breath much better today than on admission  Assessment/Plan: Principal Problem:  *Acute respiratory failure with hypoxia -Multifactorial with a combination of acute COPD with exacerbation and acute diastolic heart failure -Better with treatment of the above conditions. -Titrate off oxygen - will need a ambulatory O2 saturation prior to discharge  Active Problems:  Acute exacerbation of COPD -Better -Continue with nebulized bronchodilators -Taper off prednisone -Continue with empiric Avelox -Incentive spirometry and flutter valve as needed  Acute diastolic CHF - furosemide as tolerated -Slowly add beta blockers with bronchospasm better -Appreciate cardiology input  Exertional dyspnea/exertional chest pain with EKG changes -Cardiology planning for a left heart catheterization on 01/07/2012 -Cardiac enzymes negative -Continue with aspirin  Mild hyperkalemia -Resolved -will resume lisinopril 10/27-and follow K  Diabetes -CBGs relatively well controlled -Continue with SSI -Metformin on hold -A1c-6.6  Hypertension -Moderate control  -HCTZ stopped on 10/26 - continue on furosemide -restart Lisinopril 10/27  Dyslipidemia -LDL more than 100, diabetic patient-start statins  Chronic leukocytosis/thrombocytosis -Outpatient oncology followup   Obesity -Counseled-regarding importance of weight loss   Tobacco abuse -Counseled extensively  -Continue transdermal nicotine   Disposition: Remain inpatient  DVT Prophylaxis: Prophylactic Lovenox   Code Status: Full code   Procedures:  None   CONSULTS:  cardiology  PHYSICAL EXAM: Vital signs in last 24 hours: Filed Vitals:   01/05/12 2032 01/05/12 2154 01/06/12 0453 01/06/12 0857  BP: 155/76  126/77     Pulse: 86  108   Temp: 97.9 F (36.6 C)  98 F (36.7 C)   TempSrc: Oral  Oral   Resp: 18  18   Height:      Weight:   106.777 kg (235 lb 6.4 oz)   SpO2: 91% 92% 90% 93%    Weight change: 1.724 kg (3 lb 12.8 oz) Body mass index is 41.70 kg/(m^2).   Gen Exam: Awake and alert with clear speech.   Neck: Supple, No JVD.   Chest: B/L good air entry, clear to auscultation today.   CVS: S1 S2 Regular, no murmurs.  Abdomen: soft, BS +, non tender, non distended.  Extremities: no edema, lower extremities warm to touch. Neurologic: Non Focal.   Skin: No Rash.   Wounds: N/A.    Intake/Output from previous day:  Intake/Output Summary (Last 24 hours) at 01/06/12 0904 Last data filed at 01/05/12 2148  Gross per 24 hour  Intake    253 ml  Output      0 ml  Net    253 ml     LAB RESULTS: CBC  Lab 01/05/12 0640 01/04/12 0425 01/03/12 0726 01/02/12 1856 01/02/12 1422  WBC 15.0* 14.3* 9.3 15.6* 14.3*  HGB 12.1 11.6* 11.5* 12.3 12.5  HCT 36.6 35.2* 34.8* 37.1 37.7  PLT 446* 444* 429* 477* 446*  MCV 92.7 92.4 93.3 94.6 93.1  MCH 30.6 30.4 30.8 31.4 30.9  MCHC 33.1 33.0 33.0 33.2 33.2  RDW 16.6* 16.3* 16.1* 16.5* 16.5*  LYMPHSABS -- -- -- -- --  MONOABS -- -- -- -- --  EOSABS -- -- -- -- --  BASOSABS -- -- -- -- --  BANDABS -- -- -- -- --    Chemistries   Lab 01/06/12 0600 01/05/12 0640 01/04/12 0425 01/03/12 1641 01/03/12 0726 01/02/12 1856 01/02/12 1422  NA 133* 134* 135 -- 132* --  130*  K 3.8 4.5 5.1 5.3* 5.2* -- --  CL 91* 95* 96 -- 96 -- 95*  CO2 37* 35* 31 -- 26 -- 23  GLUCOSE 114* 133* 171* -- 164* -- 144*  BUN 24* 26* 23 -- 22 -- 26*  CREATININE 1.03 1.07 1.07 -- 0.99 1.17* --  CALCIUM 8.9 8.9 9.1 -- 8.9 -- 8.6  MG -- -- -- -- -- -- --    CBG:  Lab 01/06/12 0804 01/05/12 2121 01/05/12 1821 01/05/12 1720 01/05/12 1205  GLUCAP 135* 104* 180* 66* 274*    GFR Estimated Creatinine Clearance: 73.1 ml/min (by C-G formula based on Cr of 1.03).  Coagulation  profile No results found for this basename: INR:5,PROTIME:5 in the last 168 hours  Cardiac Enzymes  Lab 01/03/12 0726 01/03/12 0107 01/02/12 1856  CKMB -- -- --  TROPONINI <0.30 <0.30 <0.30  MYOGLOBIN -- -- --    No components found with this basename: POCBNP:3 No results found for this basename: DDIMER:2 in the last 72 hours No results found for this basename: HGBA1C:2 in the last 72 hours  Basename 01/04/12 1625  CHOL 188  HDL 53  LDLCALC 111*  TRIG 122  CHOLHDL 3.5  LDLDIRECT --   No results found for this basename: TSH,T4TOTAL,FREET3,T3FREE,THYROIDAB in the last 72 hours No results found for this basename: VITAMINB12:2,FOLATE:2,FERRITIN:2,TIBC:2,IRON:2,RETICCTPCT:2 in the last 72 hours No results found for this basename: LIPASE:2,AMYLASE:2 in the last 72 hours  Urine Studies No results found for this basename: UACOL:2,UAPR:2,USPG:2,UPH:2,UTP:2,UGL:2,UKET:2,UBIL:2,UHGB:2,UNIT:2,UROB:2,ULEU:2,UEPI:2,UWBC:2,URBC:2,UBAC:2,CAST:2,CRYS:2,UCOM:2,BILUA:2 in the last 72 hours  MICROBIOLOGY: Recent Results (from the past 240 hour(s))  MRSA PCR SCREENING     Status: Normal   Collection Time   01/02/12  6:41 PM      Component Value Range Status Comment   MRSA by PCR NEGATIVE  NEGATIVE Final   URINE CULTURE     Status: Normal   Collection Time   01/03/12 12:08 AM      Component Value Range Status Comment   Specimen Description URINE, RANDOM   Final    Special Requests CX ADDED AT 0029   Final    Culture  Setup Time 01/03/2012 00:41   Final    Colony Count NO GROWTH   Final    Culture NO GROWTH   Final    Report Status 01/04/2012 FINAL   Final     RADIOLOGY STUDIES/RESULTS: Dg Chest 2 View  01/04/2012  *RADIOLOGY REPORT*  Clinical Data: Shortness of breath, cough, intermittent fever  CHEST - 2 VIEW  Comparison: Chest x-ray of 01/02/2012  Findings: The lungs remain suboptimally aerated.  The perihilar haziness has improved somewhat.  No focal infiltrate or effusion is seen.   The heart is within upper limits of normal.  No bony abnormality is noted.  IMPRESSION: Improvement in perihilar haziness.  No definite active process. Suboptimal aeration.   Original Report Authenticated By: Juline Patch, M.D.    Dg Chest 2 View  01/02/2012  *RADIOLOGY REPORT*  Clinical Data: Short of breath  CHEST - 2 VIEW  Comparison: CT 07/02/2011, chest x-ray 12/08/2008  Findings: Heart size is upper normal.  Vascular congestion.  Patchy airspace disease appears similar to the prior CT and could represent mild edema versus chronic lung disease.  No confluent infiltrate or effusion.  No mass lesion.  IMPRESSION: Hazy lung density bilaterally, question pulmonary edema   Original Report Authenticated By: Camelia Phenes, M.D.     MEDICATIONS: Scheduled Meds:    . antiseptic oral  rinse  15 mL Mouth Rinse BID  . aspirin  81 mg Oral Daily  . atorvastatin  20 mg Oral q1800  . enoxaparin (LOVENOX) injection  40 mg Subcutaneous Q24H  . furosemide  40 mg Oral Daily  . insulin aspart  0-20 Units Subcutaneous TID WC  . insulin aspart  0-5 Units Subcutaneous QHS  . Ipratropium-Albuterol  1 puff Inhalation TID  . montelukast  10 mg Oral QHS  . moxifloxacin  400 mg Oral Q2000  . nicotine  21 mg Transdermal Daily  . pantoprazole  40 mg Oral Daily  . predniSONE  40 mg Oral Q breakfast  . sodium chloride  3 mL Intravenous Q12H  . DISCONTD: albuterol  2.5 mg Nebulization Q6H  . DISCONTD: furosemide  40 mg Intravenous Daily  . DISCONTD: hydrochlorothiazide  12.5 mg Oral Daily  . DISCONTD: ipratropium  0.5 mg Nebulization Q6H  . DISCONTD: methylPREDNISolone (SOLU-MEDROL) injection  40 mg Intravenous Q12H  . DISCONTD: moxifloxacin  400 mg Intravenous Q24H   Continuous Infusions:  PRN Meds:.acetaminophen, acetaminophen, albuterol, guaiFENesin-dextromethorphan, hydrALAZINE, morphine injection, oxyCODONE  Antibiotics: Anti-infectives     Start     Dose/Rate Route Frequency Ordered Stop   01/06/12  1900   moxifloxacin (AVELOX) tablet 400 mg        400 mg Oral Daily 01/06/12 0709     01/02/12 1900   moxifloxacin (AVELOX) IVPB 400 mg  Status:  Discontinued        400 mg 250 mL/hr over 60 Minutes Intravenous Every 24 hours 01/02/12 1839 01/06/12 0709           Jeoffrey Massed, MD  [facility] Pager:[phone]  If 7PM-7AM, please contact night-coverage [email or web address] Password [email or web address] 01/06/2012, 9:04 AM   LOS: 4 days

## 2012-01-06 NOTE — Progress Notes (Signed)
PHARMACIST - PHYSICIAN COMMUNICATION DR:   Jerral Ralph CONCERNING: Antibiotic IV to Oral Route Change Policy  RECOMMENDATION: This patient is receiving moxifloxacin by the intravenous route.  Based on criteria approved by the Pharmacy and Therapeutics Committee, the antibiotic(s) is/are being converted to the equivalent oral dose form(s).   DESCRIPTION: These criteria include:  Patient being treated for a respiratory tract infection, urinary tract infection, or cellulitis  The patient is not neutropenic and does not exhibit a GI malabsorption state  The patient is eating (either orally or via tube) and/or has been taking other orally administered medications for a least 24 hours  The patient is improving clinically and has a Tmax < 100.5  If you have questions about this conversion, please contact the Pharmacy Department  []   [phone] )  [facility] [x]   [phone] )  [facility]  []   [phone] )  [facility] []   [phone] )  [facility]    Tomi Bamberger, PharmD Clinical Pharmacist Pager: [phone] Pharmacy: [phone] 01/06/2012 7:07 AM

## 2012-01-07 ENCOUNTER — Encounter (HOSPITAL_COMMUNITY)
Admission: EM | Disposition: A | Discharge status: Home / Self Care | Payer: Self-pay | Source: Home / Self Care | Attending: Internal Medicine

## 2012-01-07 DIAGNOSIS — R0602 Shortness of breath: Secondary | ICD-10-CM

## 2012-01-07 HISTORY — PX: LEFT AND RIGHT HEART CATHETERIZATION WITH CORONARY ANGIOGRAM: SHX5449

## 2012-01-07 LAB — GLUCOSE, CAPILLARY
Glucose-Capillary: 110 mg/dL — ABNORMAL HIGH (ref 70–99)
Glucose-Capillary: 171 mg/dL — ABNORMAL HIGH (ref 70–99)
Glucose-Capillary: 145 mg/dL — ABNORMAL HIGH (ref 70–99)
Glucose-Capillary: 113 mg/dL — ABNORMAL HIGH (ref 70–99)
Glucose-Capillary: 228 mg/dL — ABNORMAL HIGH (ref 70–99)

## 2012-01-07 LAB — BASIC METABOLIC PANEL
BUN: 31 mg/dL — ABNORMAL HIGH (ref 6–23)
CO2: 34 mEq/L — ABNORMAL HIGH (ref 19–32)
Calcium: 9 mg/dL (ref 8.4–10.5)
Chloride: 96 mEq/L (ref 96–112)
Creatinine, Ser: 1.04 mg/dL (ref 0.50–1.10)
GFR calc Af Amer: 69 mL/min — ABNORMAL LOW (ref >90)
GFR calc non Af Amer: 60 mL/min — ABNORMAL LOW (ref >90)
Glucose, Bld: 115 mg/dL — ABNORMAL HIGH (ref 70–99)
Potassium: 4.4 mEq/L (ref 3.5–5.1)
Sodium: 139 mEq/L (ref 135–145)

## 2012-01-07 LAB — POCT I-STAT 3, VENOUS BLOOD GAS (G3P V)
Acid-Base Excess: 6 mmol/L — ABNORMAL HIGH (ref 0.0–2.0)
Bicarbonate: 33.9 mEq/L — ABNORMAL HIGH (ref 20.0–24.0)
O2 Saturation: 67 %
TCO2: 36 mmol/L (ref 0–100)
pCO2, Ven: 58.9 mmHg — ABNORMAL HIGH (ref 45.0–50.0)
pH, Ven: 7.368 — ABNORMAL HIGH (ref 7.250–7.300)
pO2, Ven: 37 mmHg (ref 30.0–45.0)

## 2012-01-07 LAB — POCT I-STAT 3, ART BLOOD GAS (G3+)
Acid-Base Excess: 7 mmol/L — ABNORMAL HIGH (ref 0.0–2.0)
Bicarbonate: 34.2 mEq/L — ABNORMAL HIGH (ref 20.0–24.0)
O2 Saturation: 94 %
TCO2: 36 mmol/L (ref 0–100)
pCO2 arterial: 55.3 mmHg — ABNORMAL HIGH (ref 35.0–45.0)
pH, Arterial: 7.399 (ref 7.350–7.450)
pO2, Arterial: 75 mmHg — ABNORMAL LOW (ref 80.0–100.0)

## 2012-01-07 LAB — PROTIME-INR
INR: 1.09 (ref 0.00–1.49)
Prothrombin Time: 14 seconds (ref 11.6–15.2)

## 2012-01-07 SURGERY — LEFT AND RIGHT HEART CATHETERIZATION WITH CORONARY ANGIOGRAM
Anesthesia: LOCAL

## 2012-01-07 MED ORDER — ACETAMINOPHEN 325 MG PO TABS: 650.0000 mg | ORAL_TABLET | ORAL | Status: DC | PRN

## 2012-01-07 MED ORDER — DIAZEPAM 5 MG PO TABS
ORAL_TABLET | ORAL | Status: AC
  Filled 2012-01-07: qty 1

## 2012-01-07 MED ORDER — FENTANYL CITRATE 0.05 MG/ML IJ SOLN
INTRAMUSCULAR | Status: AC
  Filled 2012-01-07: qty 2

## 2012-01-07 MED ORDER — LIDOCAINE HCL (PF) 1 % IJ SOLN
INTRAMUSCULAR | Status: AC
  Filled 2012-01-07: qty 30

## 2012-01-07 MED ORDER — MIDAZOLAM HCL 2 MG/2ML IJ SOLN
INTRAMUSCULAR | Status: AC
  Filled 2012-01-07: qty 2

## 2012-01-07 MED ORDER — NITROGLYCERIN 0.2 MG/ML ON CALL CATH LAB
INTRAVENOUS | Status: AC
  Filled 2012-01-07: qty 1

## 2012-01-07 MED ORDER — HEPARIN (PORCINE) IN NACL 2-0.9 UNIT/ML-% IJ SOLN
INTRAMUSCULAR | Status: AC
  Filled 2012-01-07: qty 1000

## 2012-01-07 MED ORDER — PREDNISONE 20 MG PO TABS
30.0000 mg | ORAL_TABLET | Freq: Every day | ORAL | Status: DC
  Administered 2012-01-08: 30 mg via ORAL
  Filled 2012-01-07 (×2): qty 1

## 2012-01-07 MED ORDER — IPRATROPIUM-ALBUTEROL 18-103 MCG/ACT IN AERO
1.0000 | INHALATION_SPRAY | RESPIRATORY_TRACT | Status: DC
  Administered 2012-01-07 – 2012-01-08 (×2): 1 via RESPIRATORY_TRACT
  Filled 2012-01-07: qty 14.7

## 2012-01-07 MED ORDER — ONDANSETRON HCL 4 MG/2ML IJ SOLN: 4.0000 mg | Freq: Four times a day (QID) | INTRAMUSCULAR | Status: DC | PRN

## 2012-01-07 MED ORDER — SODIUM CHLORIDE 0.9 % IV SOLN
INTRAVENOUS | Status: AC
  Administered 2012-01-07: 15:00:00 via INTRAVENOUS

## 2012-01-07 NOTE — Progress Notes (Signed)
Combivent inhaler would not scan into EPIC. RT scanned patient and used 2 patient identifiers as well as confirmed the correct medication and dosage prior to administering. RT also notified pharmacy so that they can address the issue. Patient continues to maintain well on room air and RT will continue to monitor.

## 2012-01-07 NOTE — CV Procedure (Signed)
    Cardiac Cath Note  Debbie Mullins [id number] 06-08-1955  Procedure: Right and Left Heart Cardiac Catheterization Note Indications:  Dyspnea   Procedure Details Consent: Obtained Time Out: Verified patient identification, verified procedure, site/side was marked, verified correct patient position, special equipment/implants available, Radiology Safety Procedures followed,  medications/allergies/relevent history reviewed, required imaging and test results available.  Performed   Medications: Fentanyl: 50 mcg IV Versed: 2 mg IV  The right femoral artery was easily canulated using a modified Seldinger technique.  Hemodynamics:   RA: 10/8/8 RV: 66/7 PCWP: 15/16/14 PA:  63/36 (47)  No aortic or mitral valve gradient.  Cardiac Output   Thermodilution: 4.12 Index of 1.99  Fick : 6.2   Index 3.0  Pulmonary Vascular Resistance  =  8 Woods units.    Arterial Sat: 94% PA Sat: 67%.  Elevated PCO2 levels in ABG and mixed venous samples  LV pressure: 134/9 Aortic pressure: 138/77  Angiography   Left Main: normal  Left anterior Descending: mild luminal irregularities, moderate calcification in mid vessel. 1st diag - minor luminal irreg.  Left Circumflex:  Large vessel.  Gives off a very large OM1  Right Coronary Artery: moderate sized vessel, dominant, PDA and PLSA are normal   LV Gram: overall normal LV function.  EF 65-70%  Complications: No apparent complications Patient did tolerate procedure well.  Contrast used: 80 cc  Conclusions:  1. Minor luminal irregularities 2. Normal LV systolic function 3. COPD with evidence of CO2 retention 4. Pulmonary hypertension - secondary to COPD and possible obesity hypoventilation.   Vesta Mixer, Montez Hageman., MD, [facility] 01/07/2012, 2:28 PM Office - [phone] Pager [phone]

## 2012-01-07 NOTE — Plan of Care (Signed)
Problem: Consults Goal: Cardiac Cath Patient Education (See Patient Education module for education specifics.)  Outcome: Completed/Met Date Met:  01/07/12 Video watched, handouts provided and discussed.

## 2012-01-07 NOTE — Care Management Note (Addendum)
    Page 1 of 1   01/08/2012     11:58:08 AM   CARE MANAGEMENT NOTE 01/08/2012  Patient:  Debbie Mullins, Debbie Mullins   Account Number:  [id number]  Date Initiated:  01/07/2012  Documentation initiated by:  [facility]  Subjective/Objective Assessment:   COPD exer, SOB     Action/Plan:   lives with cousin, Almyra Brace # [phone]   Anticipated DC Date:  01/08/2012   Anticipated DC Plan:  HOME/SELF CARE      DC Planning Services  CM consult      Choice offered to / List presented to:             Status of service:  Completed, signed off Medicare Important Message given?   (If response is "NO", the following Medicare IM given date fields will be blank) Date Medicare IM given:   Date Additional Medicare IM given:    Discharge Disposition:  HOME/SELF CARE  Per UR Regulation:  Reviewed for med. necessity/level of care/duration of stay  If discussed at Long Length of Stay Meetings, dates discussed:    Comments:  01/08/12 11:56 Letha Cape RN, BSN [phone] patient lives with cousin, pta indedependent.  Patient has pcp and medication coverage and transportation.  No needs identified. pt for dc today.  01/07/2012 1130 No CM d/c needs identified. Pt able to get her medications. Isidoro Donning RN CCM Case Mgmt phone [phone]

## 2012-01-07 NOTE — Progress Notes (Signed)
PATIENT DETAILS Name: Debbie Mullins Age: 56 y.o. Sex: female Date of Birth: 09/08/1955 Admit Date: 01/02/2012 Admitting Physician Hollice Espy, MD WUJ:[id number],YNWGN J, MD  Subjective: Claims to be feeling much better-SOB almost resolved, now not requiring O2  Assessment/Plan: Principal Problem:  *Acute respiratory failure with hypoxia -Multifactorial with a combination of acute COPD with exacerbation and acute diastolic heart failure -Better with treatment of the above conditions. -Titrate off oxygen - will need a ambulatory O2 saturation prior to discharge  Active Problems:  Acute exacerbation of COPD -Better -Continue with nebulized bronchodilators -Taper off prednisone-decrease to 30 mg today -Continue with empiric Avelox -Incentive spirometry and flutter valve as needed  Acute diastolic CHF - furosemide as tolerated -Slowly add beta blockers with bronchospasm better -Appreciate cardiology input  Exertional dyspnea/exertional chest pain with EKG changes -Cardiology planning for a left heart catheterization on 01/07/2012 -Cardiac enzymes negative -Continue with aspirin  Mild hyperkalemia -Resolved -will resume lisinopril 10/27-and follow K  Diabetes -CBGs relatively well controlled -Continue with SSI -Metformin on hold -A1c-6.6  Hypertension -Moderate control  -HCTZ stopped on 10/26 - continue on furosemide -restart Lisinopril 10/27  Dyslipidemia -LDL more than 100, diabetic patient-start statins  Chronic leukocytosis/thrombocytosis -Outpatient oncology followup   Obesity -Counseled-regarding importance of weight loss   Tobacco abuse -Counseled extensively  -Continue transdermal nicotine   Disposition: Remain inpatient  DVT Prophylaxis: Prophylactic Lovenox   Code Status: Full code   Procedures:  None   CONSULTS:  cardiology  PHYSICAL EXAM: Vital signs in last 24 hours: Filed Vitals:   01/06/12 1515 01/06/12 2044 01/07/12  0428 01/07/12 1035  BP: 116/82 118/75 119/73 110/68  Pulse: 88 99 82 87  Temp: 98.5 F (36.9 C) 98.4 F (36.9 C) 98.2 F (36.8 C)   TempSrc: Oral Oral Oral   Resp: 18 18 18    Height:      Weight:   106.142 kg (234 lb)   SpO2: 89% 91% 91%     Weight change: -0.635 kg (-1 lb 6.4 oz) Body mass index is 41.45 kg/(m^2).   Gen Exam: Awake and alert with clear speech.   Neck: Supple, No JVD.   Chest: B/L good air entry, clear to auscultation today.   CVS: S1 S2 Regular, no murmurs.  Abdomen: soft, BS +, non tender, non distended.  Extremities: no edema, lower extremities warm to touch. Neurologic: Non Focal.   Skin: No Rash.   Wounds: N/A.    Intake/Output from previous day:  Intake/Output Summary (Last 24 hours) at 01/07/12 1131 Last data filed at 01/07/12 0645  Gross per 24 hour  Intake 285.33 ml  Output      0 ml  Net 285.33 ml     LAB RESULTS: CBC  Lab 01/05/12 0640 01/04/12 0425 01/03/12 0726 01/02/12 1856 01/02/12 1422  WBC 15.0* 14.3* 9.3 15.6* 14.3*  HGB 12.1 11.6* 11.5* 12.3 12.5  HCT 36.6 35.2* 34.8* 37.1 37.7  PLT 446* 444* 429* 477* 446*  MCV 92.7 92.4 93.3 94.6 93.1  MCH 30.6 30.4 30.8 31.4 30.9  MCHC 33.1 33.0 33.0 33.2 33.2  RDW 16.6* 16.3* 16.1* 16.5* 16.5*  LYMPHSABS -- -- -- -- --  MONOABS -- -- -- -- --  EOSABS -- -- -- -- --  BASOSABS -- -- -- -- --  BANDABS -- -- -- -- --    Chemistries   Lab 01/07/12 0605 01/06/12 0600 01/05/12 0640 01/04/12 0425 01/03/12 1641 01/03/12 0726  NA 139 133* 134* 135 -- 132*  K 4.4 3.8 4.5 5.1 5.3* --  CL 96 91* 95* 96 -- 96  CO2 34* 37* 35* 31 -- 26  GLUCOSE 115* 114* 133* 171* -- 164*  BUN 31* 24* 26* 23 -- 22  CREATININE 1.04 1.03 1.07 1.07 -- 0.99  CALCIUM 9.0 8.9 8.9 9.1 -- 8.9  MG -- -- -- -- -- --    CBG:  Lab 01/07/12 0748 01/06/12 2131 01/06/12 1728 01/06/12 1234 01/06/12 0804  GLUCAP 110* 137* 146* 190* 135*    GFR Estimated Creatinine Clearance: 72.1 ml/min (by C-G formula based on Cr  of 1.04).  Coagulation profile  Lab 01/07/12 0605  INR 1.09  PROTIME --    Cardiac Enzymes  Lab 01/03/12 0726 01/03/12 0107 01/02/12 1856  CKMB -- -- --  TROPONINI <0.30 <0.30 <0.30  MYOGLOBIN -- -- --    No components found with this basename: POCBNP:3 No results found for this basename: DDIMER:2 in the last 72 hours No results found for this basename: HGBA1C:2 in the last 72 hours  Basename 01/04/12 1625  CHOL 188  HDL 53  LDLCALC 111*  TRIG 122  CHOLHDL 3.5  LDLDIRECT --   No results found for this basename: TSH,T4TOTAL,FREET3,T3FREE,THYROIDAB in the last 72 hours No results found for this basename: VITAMINB12:2,FOLATE:2,FERRITIN:2,TIBC:2,IRON:2,RETICCTPCT:2 in the last 72 hours No results found for this basename: LIPASE:2,AMYLASE:2 in the last 72 hours  Urine Studies No results found for this basename: UACOL:2,UAPR:2,USPG:2,UPH:2,UTP:2,UGL:2,UKET:2,UBIL:2,UHGB:2,UNIT:2,UROB:2,ULEU:2,UEPI:2,UWBC:2,URBC:2,UBAC:2,CAST:2,CRYS:2,UCOM:2,BILUA:2 in the last 72 hours  MICROBIOLOGY: Recent Results (from the past 240 hour(s))  MRSA PCR SCREENING     Status: Normal   Collection Time   01/02/12  6:41 PM      Component Value Range Status Comment   MRSA by PCR NEGATIVE  NEGATIVE Final   URINE CULTURE     Status: Normal   Collection Time   01/03/12 12:08 AM      Component Value Range Status Comment   Specimen Description URINE, RANDOM   Final    Special Requests CX ADDED AT 0029   Final    Culture  Setup Time 01/03/2012 00:41   Final    Colony Count NO GROWTH   Final    Culture NO GROWTH   Final    Report Status 01/04/2012 FINAL   Final     RADIOLOGY STUDIES/RESULTS: Dg Chest 2 View  01/04/2012  *RADIOLOGY REPORT*  Clinical Data: Shortness of breath, cough, intermittent fever  CHEST - 2 VIEW  Comparison: Chest x-ray of 01/02/2012  Findings: The lungs remain suboptimally aerated.  The perihilar haziness has improved somewhat.  No focal infiltrate or effusion is seen.   The heart is within upper limits of normal.  No bony abnormality is noted.  IMPRESSION: Improvement in perihilar haziness.  No definite active process. Suboptimal aeration.   Original Report Authenticated By: Juline Patch, M.D.    Dg Chest 2 View  01/02/2012  *RADIOLOGY REPORT*  Clinical Data: Short of breath  CHEST - 2 VIEW  Comparison: CT 07/02/2011, chest x-ray 12/08/2008  Findings: Heart size is upper normal.  Vascular congestion.  Patchy airspace disease appears similar to the prior CT and could represent mild edema versus chronic lung disease.  No confluent infiltrate or effusion.  No mass lesion.  IMPRESSION: Hazy lung density bilaterally, question pulmonary edema   Original Report Authenticated By: Camelia Phenes, M.D.     MEDICATIONS: Scheduled Meds:    . albuterol-ipratropium  1 puff Inhalation Custom  . antiseptic oral rinse  15 mL Mouth Rinse BID  . aspirin  81 mg Oral Daily  . atorvastatin  20 mg Oral q1800  . diazepam  5 mg Oral On Call  . enoxaparin (LOVENOX) injection  40 mg Subcutaneous Q24H  . insulin aspart  0-20 Units Subcutaneous TID WC  . insulin aspart  0-5 Units Subcutaneous QHS  . lisinopril  5 mg Oral Daily  . montelukast  10 mg Oral QHS  . moxifloxacin  400 mg Oral Q2000  . nicotine  21 mg Transdermal Daily  . pantoprazole  40 mg Oral Daily  . potassium chloride  40 mEq Oral BID  . predniSONE  40 mg Oral Q breakfast  . sodium chloride  3 mL Intravenous Q12H  . DISCONTD: diazepam  5 mg Oral On Call  . DISCONTD: Ipratropium-Albuterol  1 puff Inhalation TID   Continuous Infusions:    . sodium chloride 1.002 mL/kg/hr (01/07/12 0405)  . DISCONTD: sodium chloride     PRN Meds:.acetaminophen, acetaminophen, albuterol, guaiFENesin-dextromethorphan, hydrALAZINE, morphine injection, oxyCODONE  Antibiotics: Anti-infectives     Start     Dose/Rate Route Frequency Ordered Stop   01/06/12 1900   moxifloxacin (AVELOX) tablet 400 mg        400 mg Oral Daily  01/06/12 0709     01/02/12 1900   moxifloxacin (AVELOX) IVPB 400 mg  Status:  Discontinued        400 mg 250 mL/hr over 60 Minutes Intravenous Every 24 hours 01/02/12 1839 01/06/12 0709           Jeoffrey Massed, MD  [facility] Pager:[phone]  If 7PM-7AM, please contact night-coverage [email or web address] Password [email or web address] 01/07/2012, 11:31 AM   LOS: 5 days

## 2012-01-07 NOTE — H&P (View-Only) (Signed)
Patient Name: Debbie Mullins      SUBJECTIVE: seen 10/25 for progressive sob and found to have CHF and abnormal ECG.  Has hx of HTN, DM, COPD and morbid obesity with chronic leukocytosis and annemia for which she is followed by heme onc  Echo Nl LVEF; Tn -  CT 2012 showed some vascular disease and plan in [facility] and [facility] on Monday  Feels good today  Past Medical History  Diagnosis Date  . Diabetes mellitus   . GERD (gastroesophageal reflux disease)   . Hypertension   . Depression   . Anemia   . Leukocytosis 02/09/2011  . Normocytic anemia 02/09/2011  . Thrombocytosis 02/09/2011  . COPD (chronic obstructive pulmonary disease) 02/09/2011  . Obesity 02/09/2011  . 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 Tobacco abuse 07/04/2011    PHYSICAL EXAM Filed Vitals:   01/05/12 2032 01/05/12 2154 01/06/12 0453 01/06/12 0857  BP: 155/76  126/77   Pulse: 86  108   Temp: 97.9 F (36.6 C)  98 F (36.7 C)   TempSrc: Oral  Oral   Resp: 18  18   Height:      Weight:   235 lb 6.4 oz (106.777 kg)   SpO2: 91% 92% 90% 93%   BP 126/77  Pulse 108  Temp 98 F (36.7 C) (Oral)  Resp 18  Ht 5\' 3"  (1.6 m)  Wt 235 lb 6.4 oz (106.777 kg)  BMI 41.70 kg/m2  SpO2 93%  LMP 07/03/2011  Well developed and nourished in no acute distress HENT normal edentulous Neck supple with JVP-flat Decreased breath sunds with prolonged expiratory phase Regular rate and rhythm, no murmurs or gallops Abd-soft with active BS without hepatomegaly No Clubbing cyanosis  Tr edema Skin-warm and dry A & Oriented  Grossly normal sensory and motor function       Intake/Output Summary (Last 24 hours) at 01/06/12 1036 Last data filed at 01/05/12 2148  Gross per 24 hour  Intake    253 ml  Output      0 ml  Net    253 ml    LABS: Basic Metabolic Panel:  Lab 01/06/12 4098 01/05/12 0640 01/04/12 0425 01/03/12 1641 01/03/12 0726 01/02/12 1856 01/02/12 1422  NA 133* 134* 135 -- 132* -- 130*  K 3.8 4.5 5.1 5.3*  5.2* -- 4.9  CL 91* 95* 96 -- 96 -- 95*  CO2 37* 35* 31 -- 26 -- 23  GLUCOSE 114* 133* 171* -- 164* -- 144*  BUN 24* 26* 23 -- 22 -- 26*  CREATININE 1.03 1.07 1.07 -- 0.99 1.17* 1.14*  CALCIUM 8.9 8.9 -- -- -- -- --  MG -- -- -- -- -- -- --  PHOS -- -- -- -- -- -- --   Cardiac Enzymes: No results found for this basename: CKTOTAL:3,CKMB:3,CKMBINDEX:3,TROPONINI:3 in the last 72 hours CBC:  Lab 01/05/12 0640 01/04/12 0425 01/03/12 0726 01/02/12 1856 01/02/12 1422  WBC 15.0* 14.3* 9.3 15.6* 14.3*  NEUTROABS -- -- -- -- --  HGB 12.1 11.6* 11.5* 12.3 12.5  HCT 36.6 35.2* 34.8* 37.1 37.7  MCV 92.7 92.4 93.3 94.6 93.1  PLT 446* 444* 429* 477* 446*    Basename 01/05/12 0640 01/02/12 1550  PROBNP 586.7* 6755.0*   D-Dimer: No results found for this basename: DDIMER:2 in the last 72 hours Hemoglobin A1C: No results found for this basename: HGBA1C in the last 72 hours Fasting Lipid Panel:  Basename 01/04/12 1625  CHOL 188  HDL  53  LDLCALC 111*  TRIG 122  CHOLHDL 3.5  LDLDIRECT --    ASSESSMENT AND PLAN:  Patient Active Hospital Problem List:  Obesity (02/09/2011)  COPD exacerabation-- TAPERING off prednisone  * Tobacco abuse (07/04/2011)  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 Diuretic reltated  Will hold lasix as cath in am and give KCL for repletion Reviewed smoking cessation stragegies On for Cath  Mon am orders written   Signed, Sherryl Manges MD  01/06/2012

## 2012-01-07 NOTE — Progress Notes (Signed)
RT note: Patients Combivent inhaler was not scanning correctly. RT went to pharmacy and spoke with Jonny Ruiz, PharmD along with Hinton Lovely, Manager of Respiratory Care to address scanning issue. After much trouble shooting and discussion, the issue was corrected and the medication should scan properly at 1400 when the patients next inhalation is scheduled. Patient is continuing to maintain well on room air and is in no acute respiratory distress. Rt will continue to monitor.

## 2012-01-07 NOTE — Interval H&P Note (Signed)
History and Physical Interval Note:  01/07/2012 1:42 PM  Pt presented with increasing dyspnea.  Cigarette smoker.  She is scheduled for right and left heart cath.  Debbie Mullins  has presented today for surgery, with the diagnosis of Chest pain  The various methods of treatment have been discussed with the patient and family. After consideration of risks, benefits and other options for treatment, the patient has consented to  Procedure(s) (LRB) with comments: LEFT AND RIGHT HEART CATHETERIZATION WITH CORONARY ANGIOGRAM (N/A) as a surgical intervention .  The patient's history has been reviewed, patient examined, no change in status, stable for surgery.  I have reviewed the patient's chart and labs.  Questions were answered to the patient's satisfaction.     Elyn Aquas.

## 2012-01-08 DIAGNOSIS — I272 Pulmonary hypertension, unspecified: Secondary | ICD-10-CM | POA: Diagnosis present

## 2012-01-08 DIAGNOSIS — I2789 Other specified pulmonary heart diseases: Secondary | ICD-10-CM

## 2012-01-08 LAB — BASIC METABOLIC PANEL
BUN: 18 mg/dL (ref 6–23)
CO2: 32 mEq/L (ref 19–32)
Calcium: 8.2 mg/dL — ABNORMAL LOW (ref 8.4–10.5)
Chloride: 98 mEq/L (ref 96–112)
Creatinine, Ser: 0.9 mg/dL (ref 0.50–1.10)
GFR calc Af Amer: 83 mL/min — ABNORMAL LOW (ref >90)
GFR calc non Af Amer: 71 mL/min — ABNORMAL LOW (ref >90)
Glucose, Bld: 98 mg/dL (ref 70–99)
Potassium: 3.9 mEq/L (ref 3.5–5.1)
Sodium: 137 mEq/L (ref 135–145)

## 2012-01-08 LAB — GLUCOSE, CAPILLARY: Glucose-Capillary: 94 mg/dL (ref 70–99)

## 2012-01-08 MED ORDER — METFORMIN HCL 500 MG PO TABS: 500.0000 mg | ORAL_TABLET | Freq: Two times a day (BID) | ORAL | Status: DC

## 2012-01-08 MED ORDER — NICOTINE 21 MG/24HR TD PT24: 1.0000 | MEDICATED_PATCH | Freq: Every day | TRANSDERMAL | Status: DC

## 2012-01-08 MED ORDER — ATORVASTATIN CALCIUM 20 MG PO TABS: 20.0000 mg | ORAL_TABLET | Freq: Every day | ORAL | Status: DC

## 2012-01-08 MED ORDER — PREDNISONE 10 MG PO TABS: ORAL_TABLET | ORAL | Status: DC

## 2012-01-08 MED ORDER — TIOTROPIUM BROMIDE MONOHYDRATE 18 MCG IN CAPS: 18.0000 ug | ORAL_CAPSULE | Freq: Every day | RESPIRATORY_TRACT | Status: DC

## 2012-01-08 MED ORDER — MOXIFLOXACIN HCL 400 MG PO TABS: 400.0000 mg | ORAL_TABLET | Freq: Every day | ORAL | Status: DC

## 2012-01-08 NOTE — Progress Notes (Signed)
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  OBJECTIVE Filed Vitals:   01/07/12 1520 01/07/12 2111 01/07/12 2206 01/08/12 0421  BP: 104/65  100/70 93/58  Pulse: 77  81 76  Temp: 98 F (36.7 C)  98.1 F (36.7 C) 97.8 F (36.6 C)  TempSrc: Oral  Oral Oral  Resp:   18 18  Height:      Weight:    240 lb 11.2 oz (109.181 kg)  SpO2: 99% 98% 91% 93%    Intake/Output Summary (Last 24 hours) at 01/08/12 0949 Last data filed at 01/07/12 1800  Gross per 24 hour  Intake    860 ml  Output      0 ml  Net    860 ml   Filed Weights   01/06/12 0453 01/07/12 0428 01/08/12 0421  Weight: 235 lb 6.4 oz (106.777 kg) 234 lb (106.142 kg) 240 lb 11.2 oz (109.181 kg)   PHYSICAL EXAM General: Well developed, well nourished, female in no acute distress. Head: Normocephalic, atraumatic.  Neck: Supple without bruits, JVD minimally elevated. Lungs:  Resp regular and unlabored, . Heart: RRR, S1, S2, no S3, S4, or murmur. Abdomen: Soft, non-tender, non-distended, BS + x 4.  Extremities: No clubbing, cyanosis, no edema. Cath site right groin without ecchymosis, bruit or hematoma. Neuro: Alert and oriented X 3. Moves all extremities spontaneously. Psych: Normal affect.  LABS: INR: Basename 01/07/12 0605  INR 1.09   Basic Metabolic Panel: Basename 01/08/12 0545 01/07/12 0605  NA 137 139  K 3.9 4.4  CL 98 96  CO2 32 34*  GLUCOSE 98 115*  BUN 18 31*  CREATININE 0.90 1.04  CALCIUM 8.2* 9.0  MG -- --  PHOS -- --   BNP: Pro B Natriuretic peptide (BNP)  Date/Time Value Range Status  01/05/2012  6:40 AM 586.7* 0 - 125 pg/mL Final  01/02/2012  3:50 PM 6755.0* 0 - 125  pg/mL Final    TELE:  SR, occ ST      Radiology/Studies: Dg Chest 2 View 01/04/2012  *RADIOLOGY REPORT*  Clinical Data: Shortness of breath, cough, intermittent fever  CHEST - 2 VIEW  Comparison: Chest x-ray of 01/02/2012  Findings: The lungs remain suboptimally aerated.  The perihilar haziness has improved somewhat.  No focal infiltrate or effusion is seen.  The heart is within upper limits of normal.  No bony abnormality is noted.  IMPRESSION: Improvement in perihilar haziness.  No definite active process. Suboptimal aeration.   Original Report Authenticated By: Juline Patch, M.D.    Current Medications:    . albuterol-ipratropium  1 puff Inhalation Custom  . antiseptic oral rinse  15 mL Mouth Rinse BID  . aspirin  81 mg Oral Daily  . atorvastatin  20 mg Oral q1800  . diazepam      . diazepam  5 mg Oral On Call  . enoxaparin (LOVENOX) injection  40 mg Subcutaneous Q24H  . fentaNYL      . heparin      . insulin aspart  0-20 Units Subcutaneous TID WC  . insulin aspart  0-5 Units Subcutaneous QHS  . lidocaine      . lisinopril  5 mg Oral Daily  . midazolam      . montelukast  10 mg Oral QHS  . moxifloxacin  400 mg Oral Q2000  . nicotine  21 mg Transdermal Daily  . nitroGLYCERIN      . pantoprazole  40 mg Oral Daily  . predniSONE  30 mg Oral Q breakfast  . sodium chloride  3 mL Intravenous Q12H  . DISCONTD: Ipratropium-Albuterol  1 puff Inhalation TID  . DISCONTD: predniSONE  40 mg Oral Q breakfast      . sodium chloride 100 mL/hr at 01/07/12 1518  . DISCONTD: sodium chloride 1.002 mL/kg/hr (01/07/12 0405)    ASSESSMENT AND PLAN: 1.  Pulmonary Hypertension - secondary to severe lung disease and possibly obesity hypoventilation. Her LV function is normal.  I do not think any of her symptoms are due to left heart CHF.  She has severe pulmonary hypertension and may have subsequent right heart failure but her RA pressures are normal and additional diuresis will not help.  She  needs to stop smoking and to lose weight.  Principal Problem:  *Acute respiratory failure with hypoxia - multifactorial due to Pulmonary hypertension and improved. Per primary MD, pt OK for D/C today.  Exertional chest pain - no significant CAD at cath, follow.  Otherwise, per primary MD. 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  Agree with assessment and plan as noted above.  I have made appropriate changes in the above note.   Vesta Mixer, Montez Hageman., MD, [facility] 01/08/2012, 12:26 PM

## 2012-01-08 NOTE — Progress Notes (Signed)
Patient discharge teaching given, including activity, diet, follow-up appoints, and medications. Patient verbalized understanding of all discharge instructions. IV access was d/c'd. Vitals are stable. Skin is intact except as charted in most recent assessments. Pt to be escorted out by NT, to be driven home by friend. 

## 2012-01-08 NOTE — Discharge Summary (Signed)
PATIENT DETAILS Name: Debbie Mullins Age: 56 y.o. Sex: female Date of Birth: Oct 26, 1955 MRN: [id number]. Admit Date: 01/02/2012 Admitting Physician: Hollice Espy, MD WUJ:WJXBJ,YNWGN J, MD  Recommendations for Outpatient Follow-up:  1. 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 Diabetes mellitus   . GERD (gastroesophageal reflux disease)   . Hypertension   . Depression   . Anemia   . Leukocytosis 02/09/2011  . Normocytic anemia 02/09/2011  . Thrombocytosis 02/09/2011  . COPD (chronic obstructive pulmonary disease) 02/09/2011  . Obesity 02/09/2011  . 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 Tobacco abuse 07/04/2011    DISCHARGE MEDICATIONS:   Medication List     As of 01/08/2012  9:50 AM    STOP taking these medications         albuterol 90 MCG/ACT inhaler   Commonly known as: PROVENTIL,VENTOLIN      naproxen 500 MG tablet   Commonly known as: NAPROSYN      TAKE these medications         acetaminophen 500 MG tablet   Commonly known as: TYLENOL   Take 500 mg by mouth every 6 (six) hours as needed.      albuterol 108 (90 BASE) MCG/ACT inhaler   Commonly known as: PROVENTIL HFA;VENTOLIN HFA   Inhale 2 puffs into the lungs every 6 (six) hours as needed. For shortness of breath or wheezing      aspirin 81 MG tablet   Take 81 mg by mouth daily.      atorvastatin 20 MG tablet   Commonly known as: LIPITOR   Take 1 tablet (20 mg total) by mouth daily at 6 PM.      esomeprazole 40 MG capsule   Commonly known as: NEXIUM   Take 40 mg by mouth daily before breakfast.      lisinopril-hydrochlorothiazide 10-12.5 MG per tablet   Commonly known as: PRINZIDE,ZESTORETIC   Take 1 tablet by mouth daily.      metFORMIN 500 MG tablet   Commonly known as: GLUCOPHAGE   Take 1 tablet (500 mg total) by mouth 2 (two) times daily with a meal. Please resume on 01/10/12      montelukast 10 MG tablet   Commonly known as: SINGULAIR   Take 10 mg by mouth at bedtime.      moxifloxacin 400 MG tablet   Commonly known as: AVELOX   Take 1 tablet (400 mg total) by mouth daily at 8 pm.      nicotine 21 mg/24hr patch   Commonly known as: NICODERM CQ - dosed in mg/24 hours   Place 1 patch onto the skin daily.      predniSONE 10 MG tablet   Commonly known as: DELTASONE   Take 3 tablets daily for 2 days, then  Take 2 tablets daily for 2 days, then  Take 1 tablet daily for 1 day and then stop      tiotropium 18 MCG inhalation capsule   Commonly known as: SPIRIVA   Place 1 capsule (18 mcg total) into inhaler and inhale daily.          BRIEF HPI:  See H&P, Labs,  Consult and Test reports for all details in brief,Debbie Mullins is a 56 y.o. female with past medical history tobacco abuse, hypertension and diabetes who for the past week has had progressively worsening shortness of breath with wheezing as well as productive cough with whitish sputum. It got to the point where she felt she could not breathe today and went to see her primary care physician. In the office she was noted to have oxygen saturations in the 70s. She was given a breathing treatment sent over to the emergency room. Patient was given a dose of Solu-Medrol, high flow oxygen and continuous nebulizers but she continued to have significant wheezing and tightness. Her breathing improved to 94% oxygen saturation with 4 L and nebulizers. It was felt likely patient was having an acute exacerbation of COPD.   CONSULTATIONS:   Cardiology  PERTINENT RADIOLOGIC STUDIES: Dg Chest 2 View  01/04/2012  *RADIOLOGY REPORT*  Clinical Data: Shortness of breath, cough,  intermittent fever  CHEST - 2 VIEW  Comparison: Chest x-ray of 01/02/2012  Findings: The lungs remain suboptimally aerated.  The perihilar haziness has improved somewhat.  No focal infiltrate or effusion is seen.  The heart is within upper limits of normal.  No bony abnormality is noted.  IMPRESSION: Improvement in perihilar haziness.  No definite active process. Suboptimal aeration.   Original Report Authenticated By: Juline Patch, M.D.    Dg Chest 2 View  01/02/2012  *RADIOLOGY REPORT*  Clinical Data: Short of breath  CHEST - 2 VIEW  Comparison: CT 07/02/2011, chest x-ray 12/08/2008  Findings: Heart size is upper normal.  Vascular congestion.  Patchy airspace disease appears similar to the prior CT and could represent mild edema versus chronic lung disease.  No confluent infiltrate or effusion.  No mass lesion.  IMPRESSION: Hazy lung density bilaterally, question pulmonary edema   Original Report Authenticated By: Camelia Phenes, M.D.      PERTINENT LAB RESULTS: CBC: No results found for this basename: WBC:2,HGB:2,HCT:2,PLT:2 in the last 72 hours CMET CMP     Component Value Date/Time   NA 137 01/08/2012 0545   K 3.9 01/08/2012 0545   CL 98 01/08/2012 0545   CO2 32 01/08/2012 0545   GLUCOSE 98 01/08/2012 0545   BUN 18 01/08/2012 0545   CREATININE 0.90 01/08/2012 0545   CALCIUM 8.2* 01/08/2012 0545   PROT 7.8 01/02/2012 1422   ALBUMIN 3.4* 01/02/2012 1422   AST 12 01/02/2012 1422   ALT 11 01/02/2012 1422   ALKPHOS 136* 01/02/2012 1422   BILITOT 0.2* 01/02/2012 1422   GFRNONAA 71* 01/08/2012 0545   GFRAA 83* 01/08/2012 0545    GFR Estimated Creatinine Clearance: 84.7 ml/min (by C-G formula based on Cr of 0.9). No results found for this basename: LIPASE:2,AMYLASE:2 in the last 72 hours No results found for this basename: CKTOTAL:3,CKMB:3,CKMBINDEX:3,TROPONINI:3 in the last 72 hours No components found with this basename: POCBNP:3 No results found for this basename: DDIMER:2 in  the last 72 hours No results found for this basename: HGBA1C:2 in the last 72 hours No results found for this basename: CHOL:2,HDL:2,LDLCALC:2,TRIG:2,CHOLHDL:2,LDLDIRECT:2 in the last 72 hours No results found for this basename: TSH,T4TOTAL,FREET3,T3FREE,THYROIDAB in the last 72 hours No results found for this basename: VITAMINB12:2,FOLATE:2,FERRITIN:2,TIBC:2,IRON:2,RETICCTPCT:2 in the last 72 hours Coags:  Basename 01/07/12 0605  INR 1.09   Microbiology: Recent Results (from the past 240 hour(s))  MRSA PCR SCREENING     Status: Normal   Collection Time   01/02/12  6:41 PM  Component Value Range Status Comment   MRSA by PCR NEGATIVE  NEGATIVE Final   URINE CULTURE     Status: Normal   Collection Time   01/03/12 12:08 AM      Component Value Range Status Comment   Specimen Description URINE, RANDOM   Final    Special Requests CX ADDED AT 0029   Final    Culture  Setup Time 01/03/2012 00:41   Final    Colony Count NO GROWTH   Final    Culture NO GROWTH   Final    Report Status 01/04/2012 FINAL   Final      BRIEF HOSPITAL COURSE:  Principal Problem: Acute respiratory failure with hypoxia  -Multifactorial with a combination of acute COPD with exacerbation and acute diastolic heart failure  -Resolved with treatment of the above conditions. -Now off all O2 supplementation-O2 sats in the mid 90's  Active Problems: Acute exacerbation of COPD  -Better,lungs clear on discharge -Taper off prednisone-as above -add Spiriva -c/w Albuterol inhaler -Avelox for a few more days on discharge -have counseled regarding completely stopping tobacco use-she understands  Exertional dyspnea/exertional chest pain with EKG changes -seen by cardiology-Left heart Cath-neg for significant CAD -exertional dyspnea likely in retrospect from COPD  Acute diastolic CHF -diuresed with Lasix -clinically euvolemic-and lasix then stopped by cards -will resume HCTZ on d/c  Mild hyperkalemia    -Resolved  -Lisinopril initially held but then resumed   Hypertension -resume Lisinopril and HCTZ -has diastolic dysfunction-can add Beta blocker as the next agent when COPD exac completely resolved  Diabetes  -CBGs relatively well controlled during inpatient stay -to resume Metformin on 10/31-48 hours after catheterization  Dyslipidemia  -LDL more than 100, diabetic patient-started statins  -f/u lipid panel in 3-6 months by PCP  Chronic leukocytosis/thrombocytosis  -Outpatient oncology followup   Obesity  -Counseled-regarding importance of weight loss   Tobacco abuse  -Counseled extensively  -Continue transdermal nicotine on discharge   TODAY-DAY OF DISCHARGE:  Subjective:   Mariame Durrette today has no headache,no chest abdominal pain,no new weakness tingling or numbness, feels much better wants to go home today.   Objective:   Blood pressure 93/58, pulse 76, temperature 97.8 F (36.6 C), temperature source Oral, resp. rate 18, height 5\' 3"  (1.6 m), weight 109.181 kg (240 lb 11.2 oz), last menstrual period 07/03/2011, SpO2 93.00%.  Intake/Output Summary (Last 24 hours) at 01/08/12 0950 Last data filed at 01/07/12 1800  Gross per 24 hour  Intake    860 ml  Output      0 ml  Net    860 ml    Exam Awake Alert, Oriented *3, No new F.N deficits, Normal affect [place].AT,PERRAL Supple Neck,No JVD, No cervical lymphadenopathy appriciated.  Symmetrical Chest wall movement, Good air movement bilaterally, CTAB RRR,No Gallops,Rubs or new Murmurs, No Parasternal Heave +ve B.Sounds, Abd Soft, Non tender, No organomegaly appriciated, No rebound -guarding or rigidity. No Cyanosis, Clubbing or edema, No new Rash or bruise  DISCHARGE CONDITION: Stable  DISPOSITION: HOME  DISCHARGE INSTRUCTIONS:    Activity:  As tolerated   Diet recommendation: Diabetic Diet Heart Healthy diet  Follow-up Information    Follow up with [name], MD. Schedule an appointment as  soon as possible for a visit in 2 weeks.   Contact information:   [address] [place] [address] [phone]         Total Time spent on discharge equals 45 minutes.  SignedJeoffrey Massed 01/08/2012  9:50 AM

## 2012-01-22 ENCOUNTER — Ambulatory Visit (INDEPENDENT_AMBULATORY_CARE_PROVIDER_SITE_OTHER): Payer: Medicare Other | Admitting: Physician Assistant

## 2012-01-22 ENCOUNTER — Encounter: Payer: Self-pay | Admitting: Physician Assistant

## 2012-01-22 ENCOUNTER — Other Ambulatory Visit: Payer: Self-pay

## 2012-01-22 VITALS — BP 98/70 | HR 104 | Ht 63.0 in | Wt 225.2 lb

## 2012-01-22 DIAGNOSIS — I5032 Chronic diastolic (congestive) heart failure: Secondary | ICD-10-CM

## 2012-01-22 DIAGNOSIS — I10 Essential (primary) hypertension: Secondary | ICD-10-CM

## 2012-01-22 DIAGNOSIS — J449 Chronic obstructive pulmonary disease, unspecified: Secondary | ICD-10-CM

## 2012-01-22 DIAGNOSIS — I272 Pulmonary hypertension, unspecified: Secondary | ICD-10-CM

## 2012-01-22 DIAGNOSIS — Z720 Tobacco use: Secondary | ICD-10-CM

## 2012-01-22 DIAGNOSIS — F17200 Nicotine dependence, unspecified, uncomplicated: Secondary | ICD-10-CM

## 2012-01-22 DIAGNOSIS — I2789 Other specified pulmonary heart diseases: Secondary | ICD-10-CM

## 2012-01-22 DIAGNOSIS — E785 Hyperlipidemia, unspecified: Secondary | ICD-10-CM

## 2012-01-22 LAB — BASIC METABOLIC PANEL
BUN: 27 mg/dL — ABNORMAL HIGH (ref 6–23)
CO2: 28 mEq/L (ref 19–32)
Calcium: 7.9 mg/dL — ABNORMAL LOW (ref 8.4–10.5)
Chloride: 95 mEq/L — ABNORMAL LOW (ref 96–112)
Creatinine, Ser: 1.4 mg/dL — ABNORMAL HIGH (ref 0.4–1.2)
GFR: 52.46 mL/min — ABNORMAL LOW (ref >60.00)
Glucose, Bld: 112 mg/dL — ABNORMAL HIGH (ref 70–99)
Potassium: 3.9 mEq/L (ref 3.5–5.1)
Sodium: 135 mEq/L (ref 135–145)

## 2012-01-22 MED ORDER — LISINOPRIL 10 MG PO TABS: 5.0000 mg | ORAL_TABLET | Freq: Every day | ORAL | Status: DC

## 2012-01-22 NOTE — Addendum Note (Signed)
Addended by: Lacie Scotts on: 01/22/2012 02:55 PM   Modules accepted: Orders

## 2012-01-22 NOTE — Patient Instructions (Addendum)
STOP LISINOPRIL /HCTZ  START LISINOPRIL 10 MG TABLET TAKE 1/2 (HALF) = 5 MG DAILY  BMET TODAY  Your physician recommends that you schedule a follow-up appointment in: 04/24/12 @ 2 PM WITH DR. Tenny Craw

## 2012-01-22 NOTE — Progress Notes (Signed)
[address], [address] [place], [place]  [address] Phone: [phone], Fax:  [phone]  Date:  01/22/2012   Name:  Debbie Mullins   DOB:  October 02, 1955   MRN:  [id number]  PCP:  [name], MD  Primary Cardiologist:  Dr. Dietrich Pates  Primary Electrophysiologist:  None    History of Present Illness: Debbie Mullins is a 56 y.o. female who returns for follow up after recent hospital admission for hypoxic respiratory failure.  She has a history of DM2, HTN, COPD, ongoing tobacco abuse and lung nodules as well as GERD and morbid obesity. She has been evaluated by oncology for chronic leukocytosis, normocytic anemia and mild thrombocytosis. Peripheral blood flow cytometry in 8/13 done to rule out CLL revealed nonspecific findings.   She was admitted 10/23-10/29 with hypoxic respiratory failure. She presented with symptoms of dyspnea with exertion and chest pressure. She was treated with nebulizer, steroids antibiotics and oxygen. Chest x-ray suggested pulmonary edema and her BNP was elevated.  She was diuresed. She r/o for MI by enzymes. Echo 01/03/12: EF 60-65%, PASP 44.  She had evidence of inferior and anteroseptal T wave inversions on ECG and noted to have coronary calcifications on CT.  She was ultimately set up for right and left heart catheterization. R+L HC 01/07/12: RA 10/8/8, RV 66/7, PCWP 15/16/14, PA 63/36 (47), CO 4.12, CI 1.99, PVR 8 [place] Units;  luminal irregularities in the LAD with moderate calcification mid vessel, EF 65-70%. Pulmonary hypertension was felt to be secondary to COPD and possibly OHS.  She may have subsequent right heart failure but her RA pressures were normal and additional diuresis was not felt to be helpful. She did not have significant CAD and medical therapy was recommended.  Since discharge, she is doing okay. She denies chest discomfort. She has chronic dyspnea with exertion. She describes class III symptoms. She denies orthopnea or  PND. She denies syncope or near-syncope. She did see her PCP since discharge from the hospital. She had significant LE edema. Lasix was added to her medical regimen. Her edema is improved.  Labs (10/13):    K 3.9, creatinine 0.90, ALT 11, BNP 6755, LDL 111, Hgb 12.1  Wt Readings from Last 3 Encounters:  01/08/12 240 lb 11.2 oz (109.181 kg)  01/08/12 240 lb 11.2 oz (109.181 kg)  10/11/11 239 lb 1.6 oz (108.455 kg)     Past Medical History  Diagnosis Date  . Diabetes mellitus   . GERD (gastroesophageal reflux disease)   . Hypertension   . Depression   . Anemia   . Leukocytosis 02/09/2011  . Normocytic anemia 02/09/2011  . Thrombocytosis 02/09/2011  . COPD (chronic obstructive pulmonary disease) 02/09/2011  . Obesity 02/09/2011  . 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 Tobacco abuse 07/04/2011    Current Outpatient Prescriptions  Medication Sig Dispense Refill  . acetaminophen (TYLENOL) 500 MG tablet Take 500 mg by mouth every 6 (six) hours as needed.      Marland Kitchen albuterol (PROVENTIL HFA;VENTOLIN HFA) 108 (90 BASE) MCG/ACT inhaler Inhale 2 puffs into the lungs every 6 (six) hours as needed. For shortness of breath or wheezing      . aspirin 81 MG tablet Take 81 mg by mouth daily.      Marland Kitchen atorvastatin (LIPITOR) 20 MG tablet Take 1 tablet (20 mg total) by mouth daily at 6 PM.  30 tablet  0  . esomeprazole (NEXIUM) 40 MG capsule Take 40 mg by mouth daily before  breakfast.        . lisinopril-hydrochlorothiazide (PRINZIDE,ZESTORETIC) 10-12.5 MG per tablet Take 1 tablet by mouth daily.        . metFORMIN (GLUCOPHAGE) 500 MG tablet Take 1 tablet (500 mg total) by mouth 2 (two) times daily with a meal. Please resume on 01/10/12      . montelukast (SINGULAIR) 10 MG tablet Take 10 mg by mouth at bedtime.      . moxifloxacin (AVELOX) 400 MG tablet Take 1 tablet (400 mg total) by mouth daily at 8 pm.  3 tablet  0  . nicotine (NICODERM CQ - DOSED IN MG/24 HOURS) 21 mg/24hr patch Place 1 patch onto the  skin daily.  28 patch  0  . predniSONE (DELTASONE) 10 MG tablet Take 3 tablets daily for 2 days, then Take 2 tablets daily for 2 days, then Take 1 tablet daily for 1 day and then stop  11 tablet  0  . tiotropium (SPIRIVA HANDIHALER) 18 MCG inhalation capsule Place 1 capsule (18 mcg total) into inhaler and inhale daily.  30 capsule  0    Allergies:  No Known Allergies  Social History:  The patient  reports that she has been smoking Cigarettes.  She has a 30 pack-year smoking history. She has never used smokeless tobacco. She reports that she drinks about .6 ounces of alcohol per week. She reports that she does not use illicit drugs.   ROS:  Please see the history of present illness.   All other systems reviewed and negative.   PHYSICAL EXAM: VS:  BP 98/70  Pulse 104  Ht 5\' 3"  (1.6 m)  Wt 225 lb 3.2 oz (102.15 kg)  BMI 39.89 kg/m2  LMP 07/03/2011 Well nourished, well developed, in no acute distress HEENT: normal Neck: no JVD Cardiac:  normal S1, S2; RRR; no murmur Lungs:  clear to auscultation bilaterally, no wheezing, rhonchi or rales Abd: soft, nontender, no hepatomegaly Ext: no edema; right groin without hematoma or bruit  Skin: warm and dry Neuro:  CNs 2-12 intact, no focal abnormalities noted  EKG:  Sinus tachycardia, HR 104, left axis deviation, right atrial enlargement      ASSESSMENT AND PLAN:  1. Chronic Diastolic CHF:   Volume is stable. She did have pulmonary edema noted on chest x-ray in the hospital that responded to Lasix. This was in the setting of hypoxic respiratory failure likely related to COPD exacerbation. She did not really have significant diastolic dysfunction noted on echocardiogram. Her blood pressure is actually somewhat low. Lasix was recently added by her PCP. I will decrease her lisinopril HCTZ to just lisinopril 5 mg daily. Check a basic metabolic panel today. Followup with Dr. Tenny Craw in 3 months.  2. Pulmonary Hypertension:   This is likely related to  severe lung disease as well as possibly obesity hypoventilation syndrome. She may have right heart failure secondary to this. As noted, her LE edema is controlled with her current dose of Lasix.  3. Hypertension:  As noted, her blood pressure is somewhat low. I will adjust her lisinopril as noted above.  4. Hyperlipidemia:   She had minimal plaque on cardiac catheterization. Continue aspirin and statin. Statin management per primary care.   5. COPD:   She is trying to quit smoking.  Elevated HR is likely driven by chronic lung disease.  BP not high enough to add selective beta blocker or CCB.  Signed, Tereso Newcomer, PA-C  2:06 PM 01/22/2012

## 2012-01-23 ENCOUNTER — Telehealth: Payer: Self-pay | Admitting: *Deleted

## 2012-01-23 DIAGNOSIS — I5033 Acute on chronic diastolic (congestive) heart failure: Secondary | ICD-10-CM

## 2012-01-23 NOTE — Telephone Encounter (Signed)
Message copied by Tarri Fuller on Wed Jan 23, 2012  9:52 AM ------      Message from: [place], [place] T      Created: Tue Jan 22, 2012 11:04 PM       Creatinine elevated      Medications changed at office visit today.      I also want her to Hold Lasix x 1 day, then resume      Repeat BMET in 1 week.      Tereso Newcomer, PA-C  11:03 PM 01/22/2012

## 2012-01-23 NOTE — Telephone Encounter (Signed)
pt notified about lab results and to hold lasix tomorrow for 1 day and resume again on friday 11/15, pt coming in on 11/21 for repeat bmet

## 2012-01-31 ENCOUNTER — Other Ambulatory Visit (INDEPENDENT_AMBULATORY_CARE_PROVIDER_SITE_OTHER): Payer: Medicare Other

## 2012-01-31 DIAGNOSIS — I5033 Acute on chronic diastolic (congestive) heart failure: Secondary | ICD-10-CM

## 2012-01-31 LAB — BASIC METABOLIC PANEL
BUN: 28 mg/dL — ABNORMAL HIGH (ref 6–23)
CO2: 28 mEq/L (ref 19–32)
Calcium: 8.6 mg/dL (ref 8.4–10.5)
Chloride: 103 mEq/L (ref 96–112)
Creatinine, Ser: 1.1 mg/dL (ref 0.4–1.2)
GFR: 64.4 mL/min (ref >60.00)
Glucose, Bld: 138 mg/dL — ABNORMAL HIGH (ref 70–99)
Potassium: 5 mEq/L (ref 3.5–5.1)
Sodium: 138 mEq/L (ref 135–145)

## 2012-02-01 ENCOUNTER — Telehealth: Payer: Self-pay | Admitting: *Deleted

## 2012-02-01 NOTE — Telephone Encounter (Signed)
pt notified about lab results today, verbalized understanding 

## 2012-02-01 NOTE — Telephone Encounter (Signed)
Message copied by Tarri Fuller on Fri Feb 01, 2012  8:33 AM ------      Message from: [place], [place] T      Created: Thu Jan 31, 2012  5:27 PM       K+ and creatinine stable      Continue with current treatment plan.      Tereso Newcomer, PA-C  5:26 PM 01/31/2012

## 2012-02-15 ENCOUNTER — Encounter (HOSPITAL_COMMUNITY): Payer: Medicare Other | Attending: Oncology

## 2012-02-15 DIAGNOSIS — D72829 Elevated white blood cell count, unspecified: Secondary | ICD-10-CM | POA: Insufficient documentation

## 2012-02-15 DIAGNOSIS — J4489 Other specified chronic obstructive pulmonary disease: Secondary | ICD-10-CM | POA: Insufficient documentation

## 2012-02-15 DIAGNOSIS — F17200 Nicotine dependence, unspecified, uncomplicated: Secondary | ICD-10-CM | POA: Insufficient documentation

## 2012-02-15 DIAGNOSIS — R918 Other nonspecific abnormal finding of lung field: Secondary | ICD-10-CM | POA: Insufficient documentation

## 2012-02-15 DIAGNOSIS — J449 Chronic obstructive pulmonary disease, unspecified: Secondary | ICD-10-CM | POA: Insufficient documentation

## 2012-02-15 LAB — BASIC METABOLIC PANEL
BUN: 23 mg/dL (ref 6–23)
CO2: 23 mEq/L (ref 19–32)
Calcium: 9.2 mg/dL (ref 8.4–10.5)
Chloride: 104 mEq/L (ref 96–112)
Creatinine, Ser: 1.04 mg/dL (ref 0.50–1.10)
GFR calc Af Amer: 69 mL/min — ABNORMAL LOW (ref >90)
GFR calc non Af Amer: 60 mL/min — ABNORMAL LOW (ref >90)
Glucose, Bld: 75 mg/dL (ref 70–99)
Potassium: 4.7 mEq/L (ref 3.5–5.1)
Sodium: 139 mEq/L (ref 135–145)

## 2012-02-15 LAB — DIFFERENTIAL
Basophils Absolute: 0 10*3/uL (ref 0.0–0.1)
Basophils Relative: 0 % (ref 0–1)
Eosinophils Absolute: 0.4 10*3/uL (ref 0.0–0.7)
Eosinophils Relative: 3 % (ref 0–5)
Lymphocytes Relative: 48 % — ABNORMAL HIGH (ref 12–46)
Lymphs Abs: 5.4 10*3/uL — ABNORMAL HIGH (ref 0.7–4.0)
Monocytes Absolute: 0.6 10*3/uL (ref 0.1–1.0)
Monocytes Relative: 6 % (ref 3–12)
Neutro Abs: 4.8 10*3/uL (ref 1.7–7.7)
Neutrophils Relative %: 42 % — ABNORMAL LOW (ref 43–77)

## 2012-02-15 LAB — CBC
HCT: 36.4 % (ref 36.0–46.0)
Hemoglobin: 11.8 g/dL — ABNORMAL LOW (ref 12.0–15.0)
MCH: 29.9 pg (ref 26.0–34.0)
MCHC: 32.4 g/dL (ref 30.0–36.0)
MCV: 92.4 fL (ref 78.0–100.0)
Platelets: 331 10*3/uL (ref 150–400)
RBC: 3.94 MIL/uL (ref 3.87–5.11)
RDW: 14.7 % (ref 11.5–15.5)
WBC: 11.2 10*3/uL — ABNORMAL HIGH (ref 4.0–10.5)

## 2012-02-15 NOTE — Progress Notes (Signed)
Debbie Mullins presented for [employer]. Labs per MD order drawn via Peripheral Line 23 gauge needle inserted in rt ac  Good blood return present. Procedure without incident.  Needle removed intact. Patient tolerated procedure well.

## 2012-02-22 ENCOUNTER — Encounter (HOSPITAL_COMMUNITY): Payer: Self-pay | Admitting: Oncology

## 2012-02-22 ENCOUNTER — Encounter (HOSPITAL_BASED_OUTPATIENT_CLINIC_OR_DEPARTMENT_OTHER): Payer: Medicare Other | Admitting: Oncology

## 2012-02-22 VITALS — BP 159/89 | HR 102 | Temp 97.2°F | Resp 20 | Wt 245.1 lb

## 2012-02-22 DIAGNOSIS — F17200 Nicotine dependence, unspecified, uncomplicated: Secondary | ICD-10-CM

## 2012-02-22 DIAGNOSIS — R918 Other nonspecific abnormal finding of lung field: Secondary | ICD-10-CM

## 2012-02-22 DIAGNOSIS — J449 Chronic obstructive pulmonary disease, unspecified: Secondary | ICD-10-CM

## 2012-02-22 DIAGNOSIS — D72829 Elevated white blood cell count, unspecified: Secondary | ICD-10-CM

## 2012-02-22 NOTE — Progress Notes (Signed)
Problem #1 leukocytosis, benign, without worsening. She is very stable from the standpoint. Problem #2 COPD with pulmonary nodules and a followup CAT scan will be done in April. She tells me today that she was admitted in October for acute respiratory distress secondary to an exacerbation of her smoking, COPD. She is down to a few cigarettes a day. She states she is working very hard to get rid of that.  We will check her CAT scan and blood work in April and see her back then. I do not think the white count is an issue.

## 2012-02-22 NOTE — Patient Instructions (Addendum)
[facility] Discharge Instructions  RECOMMENDATIONS MADE BY THE CONSULTANT AND ANY TEST RESULTS WILL BE SENT TO YOUR REFERRING PHYSICIAN.  EXAM FINDINGS BY THE PHYSICIAN TODAY AND SIGNS OR SYMPTOMS TO REPORT TO CLINIC OR PRIMARY PHYSICIAN: Discussion by MD.  Your blood counts are stable.  Don't need to do anything different.  MEDICATIONS PRESCRIBED:  none  INSTRUCTIONS GIVEN AND DISCUSSED: Report fevers, recurring infections, night sweats, etc.  SPECIAL INSTRUCTIONS/FOLLOW-UP: Blood work, CT of Chest and to see PA in April.  Thank you for choosing [facility] to provide your oncology and hematology care.  To afford each patient quality time with our providers, please arrive at least 15 minutes before your scheduled appointment time.  With your help, our goal is to use those 15 minutes to complete the necessary work-up to ensure our physicians have the information they need to help with your evaluation and healthcare recommendations.    Effective January 1st, 2014, we ask that you re-schedule your appointment with our physicians should you arrive 10 or more minutes late for your appointment.  We strive to give you quality time with our providers, and arriving late affects you and other patients whose appointments are after yours.    Again, thank you for choosing [facility]  Our hope is that these requests will decrease the amount of time that you wait before being seen by our physicians.       _____________________________________________________________  I acknowledge that I have been informed and understand all the instructions given to me and received a copy. I do not have anymore questions at this time but understand that I may call the Cancer Center at [facility] at [phone] during business hours should I have any further questions or need assistance in obtaining follow-up care.    __________________________________________   _____________  __________ Signature of Patient or Authorized Representative            Date                   Time    __________________________________________ Nurse's Signature

## 2012-04-24 ENCOUNTER — Ambulatory Visit: Payer: Medicare Other | Admitting: Internal Medicine

## 2012-05-16 ENCOUNTER — Ambulatory Visit: Payer: Medicare Other | Admitting: Internal Medicine

## 2012-07-03 ENCOUNTER — Other Ambulatory Visit (HOSPITAL_COMMUNITY): Payer: Medicare Other

## 2012-07-03 ENCOUNTER — Ambulatory Visit (HOSPITAL_COMMUNITY): Payer: Medicare Other

## 2012-07-04 ENCOUNTER — Ambulatory Visit (HOSPITAL_COMMUNITY): Payer: Medicare Other | Admitting: Oncology

## 2012-07-07 ENCOUNTER — Ambulatory Visit (HOSPITAL_COMMUNITY): Payer: Medicare Other | Admitting: Oncology

## 2012-07-09 ENCOUNTER — Other Ambulatory Visit (HOSPITAL_COMMUNITY): Payer: Self-pay | Admitting: Oncology

## 2012-07-09 ENCOUNTER — Ambulatory Visit (HOSPITAL_COMMUNITY)
Admission: RE | Admit: 2012-07-09 | Discharge: 2012-07-09 | Disposition: A | Discharge status: Home / Self Care | Payer: Medicare Other | Source: Ambulatory Visit | Attending: Oncology | Admitting: Oncology

## 2012-07-09 DIAGNOSIS — Z720 Tobacco use: Secondary | ICD-10-CM

## 2012-07-09 DIAGNOSIS — D72829 Elevated white blood cell count, unspecified: Secondary | ICD-10-CM

## 2012-07-09 DIAGNOSIS — F17200 Nicotine dependence, unspecified, uncomplicated: Secondary | ICD-10-CM | POA: Insufficient documentation

## 2012-07-09 DIAGNOSIS — J449 Chronic obstructive pulmonary disease, unspecified: Secondary | ICD-10-CM

## 2012-07-09 DIAGNOSIS — R911 Solitary pulmonary nodule: Secondary | ICD-10-CM | POA: Insufficient documentation

## 2012-07-09 DIAGNOSIS — R05 Cough: Secondary | ICD-10-CM | POA: Insufficient documentation

## 2012-07-09 DIAGNOSIS — R918 Other nonspecific abnormal finding of lung field: Secondary | ICD-10-CM

## 2012-07-09 DIAGNOSIS — R059 Cough, unspecified: Secondary | ICD-10-CM | POA: Insufficient documentation

## 2012-07-14 ENCOUNTER — Ambulatory Visit (HOSPITAL_COMMUNITY): Payer: Medicare Other | Admitting: Oncology

## 2012-07-16 ENCOUNTER — Ambulatory Visit (HOSPITAL_COMMUNITY): Payer: Medicare Other | Admitting: Oncology

## 2012-07-21 ENCOUNTER — Encounter (HOSPITAL_COMMUNITY): Payer: Medicare Other | Attending: Oncology | Admitting: Oncology

## 2012-07-21 ENCOUNTER — Encounter (HOSPITAL_COMMUNITY): Payer: Self-pay | Admitting: Oncology

## 2012-07-21 VITALS — BP 106/70 | HR 90 | Temp 98.7°F | Resp 20 | Wt 241.9 lb

## 2012-07-21 DIAGNOSIS — J449 Chronic obstructive pulmonary disease, unspecified: Secondary | ICD-10-CM

## 2012-07-21 DIAGNOSIS — I872 Venous insufficiency (chronic) (peripheral): Secondary | ICD-10-CM

## 2012-07-21 DIAGNOSIS — J441 Chronic obstructive pulmonary disease with (acute) exacerbation: Secondary | ICD-10-CM

## 2012-07-21 DIAGNOSIS — F17200 Nicotine dependence, unspecified, uncomplicated: Secondary | ICD-10-CM

## 2012-07-21 DIAGNOSIS — D72829 Elevated white blood cell count, unspecified: Secondary | ICD-10-CM

## 2012-07-21 NOTE — Progress Notes (Signed)
#  1 leukocytosis, it is felt to be benign, it is not progressing. I suspect is reactive  #2 COPD with a 40 pack year history smoking, pulmonary nodules which are stable but she needs a repeat CT scan in one year. #3 wheezing secondary to her smoking and I suspect that her white count is elevated because of the chronic inflammation within her lungs. #4 venous insufficiency both legs and she will start also elevate her legs which is not walking on them so that her feet are above the level of her umbilicus.  She states that she is run out of her inhaler. This was discussed with her after I listened to her lungs and found she was wheezing bilaterally. She is still smoking a pack to a pack and half a day she states. She started smoking when she was approximately [age] years of age.  She is not aware of any issues with her mouth and she does have upper and lower dentures. She is not aware of fevers or chills. Her lungs show diffuse wheezes bilaterally anteriorly and posteriorly. She has no adenopathy in the cervical, supraclavicular, infraclavicular, axillary or anal areas. Abdomen remains obese. She has puffiness of both lower extremities no pitting edema presently. She has mild discoloration of her legs consistent as well with venous insufficiency. She also has scars on both knees for she's had replacements.  She clearly needs to quit smoking, we will see her back in a year sooner if need be but we will repeat a blood count and a CT scan of her chest in a year. I do not think she needs to be seen sooner unless there is a new issue. I have encouraged her to be back in touch with Dr. Parke Simmers, her primary care physician to get this wheezing under better control

## 2012-07-21 NOTE — Patient Instructions (Addendum)
.  [facility] Discharge Instructions  RECOMMENDATIONS MADE BY THE CONSULTANT AND ANY TEST RESULTS WILL BE SENT TO YOUR REFERRING PHYSICIAN.  EXAM FINDINGS BY THE PHYSICIAN TODAY AND SIGNS OR SYMPTOMS TO REPORT TO CLINIC OR PRIMARY PHYSICIAN:  Not to worried about your white cell count.  Your lungs are your main concern.   INSTRUCTIONS GIVEN AND DISCUSSED: Labs and ct scan in one year  SPECIAL INSTRUCTIONS/FOLLOW-UP: See our PA after scans  Thank you for choosing [facility] to provide your oncology and hematology care.  To afford each patient quality time with our providers, please arrive at least 15 minutes before your scheduled appointment time.  With your help, our goal is to use those 15 minutes to complete the necessary work-up to ensure our physicians have the information they need to help with your evaluation and healthcare recommendations.    Effective January 1st, 2014, we ask that you re-schedule your appointment with our physicians should you arrive 10 or more minutes late for your appointment.  We strive to give you quality time with our providers, and arriving late affects you and other patients whose appointments are after yours.    Again, thank you for choosing [facility].  Our hope is that these requests will decrease the amount of time that you wait before being seen by our physicians.       _____________________________________________________________  Should you have questions after your visit to [facility], please contact our office at [phone] between the hours of 8:30 a.m. and 5:00 p.m.  Voicemails left after 4:30 p.m. will not be returned until the following business day.  For prescription refill requests, have your pharmacy contact our office with your prescription refill request.

## 2013-02-23 ENCOUNTER — Other Ambulatory Visit: Payer: Self-pay | Admitting: Physician Assistant

## 2013-03-27 ENCOUNTER — Ambulatory Visit: Payer: Medicare Other | Admitting: Internal Medicine

## 2013-03-30 ENCOUNTER — Encounter: Payer: Self-pay | Admitting: Internal Medicine

## 2013-03-30 ENCOUNTER — Encounter (INDEPENDENT_AMBULATORY_CARE_PROVIDER_SITE_OTHER): Payer: Self-pay

## 2013-03-30 ENCOUNTER — Ambulatory Visit: Payer: Medicare Other | Admitting: Internal Medicine

## 2013-03-30 ENCOUNTER — Ambulatory Visit (INDEPENDENT_AMBULATORY_CARE_PROVIDER_SITE_OTHER): Payer: Medicare Other | Admitting: Internal Medicine

## 2013-03-30 VITALS — BP 125/79 | HR 90 | Ht 63.0 in | Wt 233.0 lb

## 2013-03-30 DIAGNOSIS — Z720 Tobacco use: Secondary | ICD-10-CM

## 2013-03-30 DIAGNOSIS — E1122 Type 2 diabetes mellitus with diabetic chronic kidney disease: Secondary | ICD-10-CM

## 2013-03-30 DIAGNOSIS — N181 Chronic kidney disease, stage 1: Secondary | ICD-10-CM

## 2013-03-30 DIAGNOSIS — R0602 Shortness of breath: Secondary | ICD-10-CM

## 2013-03-30 DIAGNOSIS — I10 Essential (primary) hypertension: Secondary | ICD-10-CM

## 2013-03-30 DIAGNOSIS — E1129 Type 2 diabetes mellitus with other diabetic kidney complication: Secondary | ICD-10-CM

## 2013-03-30 DIAGNOSIS — F17200 Nicotine dependence, unspecified, uncomplicated: Secondary | ICD-10-CM

## 2013-03-30 LAB — BASIC METABOLIC PANEL
BUN: 28 mg/dL — ABNORMAL HIGH (ref 6–23)
CO2: 28 mEq/L (ref 19–32)
Calcium: 8.9 mg/dL (ref 8.4–10.5)
Chloride: 99 mEq/L (ref 96–112)
Creatinine, Ser: 1.3 mg/dL — ABNORMAL HIGH (ref 0.4–1.2)
GFR: 55.04 mL/min — ABNORMAL LOW (ref >60.00)
Glucose, Bld: 98 mg/dL (ref 70–99)
Potassium: 4.8 mEq/L (ref 3.5–5.1)
Sodium: 136 mEq/L (ref 135–145)

## 2013-03-30 LAB — CBC
HCT: 36.5 % (ref 36.0–46.0)
Hemoglobin: 12.1 g/dL (ref 12.0–15.0)
MCHC: 33.1 g/dL (ref 30.0–36.0)
MCV: 91.6 fl (ref 78.0–100.0)
Platelets: 335 10*3/uL (ref 150.0–400.0)
RBC: 3.98 Mil/uL (ref 3.87–5.11)
RDW: 15.2 % — ABNORMAL HIGH (ref 11.5–14.6)
WBC: 10.8 10*3/uL — ABNORMAL HIGH (ref 4.5–10.5)

## 2013-03-30 MED ORDER — FUROSEMIDE 20 MG PO TABS: 20.0000 mg | ORAL_TABLET | Freq: Every day | ORAL | Status: DC

## 2013-03-30 MED ORDER — LISINOPRIL 10 MG PO TABS: 10.0000 mg | ORAL_TABLET | Freq: Every day | ORAL | Status: DC

## 2013-03-30 NOTE — Patient Instructions (Signed)
Your physician recommends that you return for lab work in: today bmet cbc  Your physician recommends that you continue on your current medications as directed. Please refer to the Current Medication list given to you today.   Your physician recommends that you schedule a follow-up appointment in: as needed

## 2013-03-30 NOTE — Progress Notes (Signed)
HPI Patient is a 58 yo with a history of HTN, DM, COPD.   I saw her back in 2013 when hospitalized for SOB  R and left heart catheterization done that showed: RA: 10/8/8 RV: 66/7 PCWP: 15/16/14 PA: 63/36 (47)  No aortic or mitral valve gradient.  Cardiac Output Thermodilution: 4.12 Index of 1.99 Fick: 6.2 Index 3.0  Pulmonary Vascular Resistance = 8 Woods units.  Arterial Sat: 94% PA Sat: 67%.  Elevated PCO2 levels in ABG and mixed venous samples  LV pressure: 134/9 Aortic pressure: 138/77   Left Main: normal  Left anterior Descending: mild luminal irregularities, moderate calcification in mid vessel. 1st diag - minor luminal irreg.  Left Circumflex: Large vessel. Gives off a very large OM1  Right Coronary Artery: moderate sized vessel, dominant, PDA and PLSA are normal  LV Gram: overall normal LV function. EF 65-70%    As conclusion it was felt that the patinet's Pulmonary hypertension was secondary to COPD and possible obesity hypoventilation.  She comes in today for refills Denies CP  Breathing is stable   Says she has cut back on smoking but still smokes about 2 to 3 cigs per day   Follows with Dr Criss Rosales   No Known Allergies  Current Outpatient Prescriptions  Medication Sig Dispense Refill  . acetaminophen (TYLENOL) 500 MG tablet Take 500 mg by mouth every 6 (six) hours as needed.      Marland Kitchen albuterol (PROVENTIL HFA;VENTOLIN HFA) 108 (90 BASE) MCG/ACT inhaler Inhale 2 puffs into the lungs every 6 (six) hours as needed. For shortness of breath or wheezing      . aspirin 81 MG tablet Take 81 mg by mouth daily.      Marland Kitchen esomeprazole (NEXIUM) 40 MG capsule Take 40 mg by mouth daily before breakfast.        . furosemide (LASIX) 20 MG tablet Take 20 mg by mouth daily.       Marland Kitchen lisinopril (PRINIVIL,ZESTRIL) 10 MG tablet TAKE ONE-HALF TABLET BY MOUTH EVERY DAY  15 tablet  0  . metFORMIN (GLUCOPHAGE) 500 MG tablet Take 1 tablet (500 mg total) by mouth 2 (two) times daily with a meal. Please resume  on 01/10/12      . montelukast (SINGULAIR) 10 MG tablet Take 10 mg by mouth at bedtime.      . naproxen (NAPROSYN) 500 MG tablet Take 500 mg by mouth 2 (two) times daily with a meal.      . nicotine (NICODERM CQ - DOSED IN MG/24 HOURS) 21 mg/24hr patch Place 1 patch onto the skin daily. Uses occasionally       No current facility-administered medications for this visit.    Past Medical History  Diagnosis Date  . Diabetes mellitus   . GERD (gastroesophageal reflux disease)   . Hypertension   . Depression   . Anemia   . Leukocytosis 02/09/2011  . Normocytic anemia 02/09/2011  . Thrombocytosis 02/09/2011  . COPD (chronic obstructive pulmonary disease) 02/09/2011  . Obesity 02/09/2011  . 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 Tobacco abuse 07/04/2011  . Edema of both legs     Past Surgical History  Procedure Laterality Date  . Total knee arthroplasty  bilateral    left oct. 2008, right feb. 2009    Family History  Problem Relation Age of Onset  . Heart failure Mother   . Cancer Maternal Grandmother     History   Social History  . Marital Status: Single  Spouse Name: N/A    Number of Children: N/A  . Years of Education: N/A   Occupational History  . Not on file.   Social History Main Topics  . Smoking status: Current Every Day Smoker -- 1.50 packs/day for 20 years    Types: Cigarettes  . Smokeless tobacco: Never Used  . Alcohol Use: 0.6 oz/week    1 Glasses of wine per week     Comment: once a month  . Drug Use: No  . Sexual Activity: No   Other Topics Concern  . Not on file   Social History Narrative  . No narrative on file    Review of Systems:  All systems reviewed.  They are negative to the above problem except as previously stated.  Vital Signs: BP 125/79  Pulse 90  Ht 5\' 3"  (1.6 m)  Wt 233 lb (105.688 kg)  BMI 41.28 kg/m2  Physical Exam O2 sat at rest 93%  HR 87  With walking O2 sat went to 86% with HR of 122.  W HEENT:  Normocephalic,  atraumatic. EOMI, PERRLA.  Neck: JVP is normal.  No bruits.  Lungs: Decreased airflow Heart: Regular rate and rhythm. Normal S1, S2. No S3.   No significant murmurs. PMI not displaced.  Abdomen:  Supple, nontender. Normal bowel sounds. No masses. No hepatomegaly.  Extremities:   Good distal pulses throughout. No lower extremity edema.  Musculoskeletal :moving all extremities.  Neuro:   alert and oriented x3.  CN II-XII grossly intact.  EKG  SR90 bpm    Assessment and Plan:  1.  HTN  Adequate control  WIll refill meds   2.  COPD  The patient becomes hypoxic with walking She does not want to wear oxygen. Will review with Dr. Criss Rosales  3.  Tob  Stressed the need to continue to cut back on tobacco use and quit.

## 2013-04-01 ENCOUNTER — Other Ambulatory Visit: Payer: Self-pay | Admitting: *Deleted

## 2013-04-01 DIAGNOSIS — I5033 Acute on chronic diastolic (congestive) heart failure: Secondary | ICD-10-CM

## 2013-04-01 DIAGNOSIS — R0602 Shortness of breath: Secondary | ICD-10-CM

## 2013-04-01 DIAGNOSIS — Z720 Tobacco use: Secondary | ICD-10-CM

## 2013-04-01 DIAGNOSIS — E1122 Type 2 diabetes mellitus with diabetic chronic kidney disease: Secondary | ICD-10-CM

## 2013-04-01 DIAGNOSIS — I10 Essential (primary) hypertension: Secondary | ICD-10-CM

## 2013-04-01 DIAGNOSIS — N181 Chronic kidney disease, stage 1: Principal | ICD-10-CM

## 2013-04-01 DIAGNOSIS — I272 Pulmonary hypertension, unspecified: Secondary | ICD-10-CM

## 2013-04-01 MED ORDER — FUROSEMIDE 20 MG PO TABS: 20.0000 mg | ORAL_TABLET | ORAL | Status: DC

## 2013-04-06 ENCOUNTER — Other Ambulatory Visit: Payer: Self-pay

## 2013-04-06 DIAGNOSIS — I10 Essential (primary) hypertension: Secondary | ICD-10-CM

## 2013-04-06 DIAGNOSIS — N181 Chronic kidney disease, stage 1: Principal | ICD-10-CM

## 2013-04-06 DIAGNOSIS — E1122 Type 2 diabetes mellitus with diabetic chronic kidney disease: Secondary | ICD-10-CM

## 2013-04-06 DIAGNOSIS — R0602 Shortness of breath: Secondary | ICD-10-CM

## 2013-04-06 DIAGNOSIS — Z720 Tobacco use: Secondary | ICD-10-CM

## 2013-04-06 MED ORDER — LISINOPRIL 10 MG PO TABS: ORAL_TABLET | ORAL | Status: DC

## 2013-04-17 ENCOUNTER — Ambulatory Visit: Payer: Medicare Other | Admitting: Internal Medicine

## 2013-05-04 ENCOUNTER — Ambulatory Visit: Payer: Medicare Other | Admitting: Internal Medicine

## 2013-06-24 ENCOUNTER — Ambulatory Visit
Admission: RE | Admit: 2013-06-24 | Discharge: 2013-06-24 | Disposition: A | Discharge status: Home / Self Care | Payer: Medicare Other | Source: Ambulatory Visit | Attending: Orthopedic Surgery | Admitting: Orthopedic Surgery

## 2013-06-24 ENCOUNTER — Other Ambulatory Visit: Payer: Self-pay | Admitting: Orthopedic Surgery

## 2013-06-24 DIAGNOSIS — Z96652 Presence of left artificial knee joint: Secondary | ICD-10-CM

## 2013-07-08 ENCOUNTER — Ambulatory Visit (HOSPITAL_COMMUNITY): Payer: Medicare Other

## 2013-07-10 ENCOUNTER — Other Ambulatory Visit: Payer: Self-pay | Admitting: Orthopedic Surgery

## 2013-07-10 DIAGNOSIS — M25561 Pain in right knee: Secondary | ICD-10-CM

## 2013-07-14 ENCOUNTER — Other Ambulatory Visit: Payer: Self-pay | Admitting: Orthopedic Surgery

## 2013-07-14 ENCOUNTER — Ambulatory Visit
Admission: RE | Admit: 2013-07-14 | Discharge: 2013-07-14 | Disposition: A | Discharge status: Home / Self Care | Payer: Medicare Other | Source: Ambulatory Visit | Attending: Orthopedic Surgery | Admitting: Orthopedic Surgery

## 2013-07-14 DIAGNOSIS — T84019A Broken internal joint prosthesis, unspecified site, initial encounter: Secondary | ICD-10-CM

## 2013-07-16 ENCOUNTER — Other Ambulatory Visit (HOSPITAL_COMMUNITY): Payer: Self-pay

## 2013-07-17 ENCOUNTER — Other Ambulatory Visit (HOSPITAL_COMMUNITY): Payer: Self-pay | Admitting: Oncology

## 2013-07-17 DIAGNOSIS — D72829 Elevated white blood cell count, unspecified: Secondary | ICD-10-CM

## 2013-07-24 ENCOUNTER — Other Ambulatory Visit (HOSPITAL_COMMUNITY): Payer: Self-pay | Admitting: Orthopaedic Surgery

## 2013-07-31 ENCOUNTER — Encounter (HOSPITAL_COMMUNITY): Payer: Self-pay

## 2013-08-05 ENCOUNTER — Encounter (HOSPITAL_COMMUNITY): Payer: Self-pay

## 2013-08-05 ENCOUNTER — Encounter (HOSPITAL_COMMUNITY)
Admission: RE | Admit: 2013-08-05 | Discharge: 2013-08-05 | Disposition: A | Discharge status: Home / Self Care | Payer: Medicare Other | Source: Ambulatory Visit | Attending: Orthopaedic Surgery | Admitting: Orthopaedic Surgery

## 2013-08-05 DIAGNOSIS — J449 Chronic obstructive pulmonary disease, unspecified: Secondary | ICD-10-CM | POA: Insufficient documentation

## 2013-08-05 DIAGNOSIS — I10 Essential (primary) hypertension: Secondary | ICD-10-CM | POA: Insufficient documentation

## 2013-08-05 DIAGNOSIS — Z01818 Encounter for other preprocedural examination: Secondary | ICD-10-CM | POA: Insufficient documentation

## 2013-08-05 DIAGNOSIS — Z01812 Encounter for preprocedural laboratory examination: Secondary | ICD-10-CM | POA: Insufficient documentation

## 2013-08-05 DIAGNOSIS — J4489 Other specified chronic obstructive pulmonary disease: Secondary | ICD-10-CM | POA: Insufficient documentation

## 2013-08-05 HISTORY — DX: Urgency of urination: R39.15

## 2013-08-05 HISTORY — DX: Heart failure, unspecified: I50.9

## 2013-08-05 HISTORY — DX: Frequency of micturition: R35.0

## 2013-08-05 HISTORY — DX: Localized edema: R60.0

## 2013-08-05 HISTORY — DX: Unspecified asthma, uncomplicated: J45.909

## 2013-08-05 HISTORY — DX: Edema, unspecified: R60.9

## 2013-08-05 HISTORY — DX: Pneumonia, unspecified organism: J18.9

## 2013-08-05 HISTORY — DX: Personal history of colon polyps, unspecified: Z86.0100

## 2013-08-05 HISTORY — DX: Personal history of colonic polyps: Z86.010

## 2013-08-05 LAB — COMPREHENSIVE METABOLIC PANEL
ALT: 9 U/L (ref 0–35)
AST: 10 U/L (ref 0–37)
Albumin: 3.7 g/dL (ref 3.5–5.2)
Alkaline Phosphatase: 132 U/L — ABNORMAL HIGH (ref 39–117)
BUN: 28 mg/dL — ABNORMAL HIGH (ref 6–23)
CO2: 25 mEq/L (ref 19–32)
Calcium: 9.1 mg/dL (ref 8.4–10.5)
Chloride: 101 mEq/L (ref 96–112)
Creatinine, Ser: 1.1 mg/dL (ref 0.50–1.10)
GFR calc Af Amer: 64 mL/min — ABNORMAL LOW (ref >90)
GFR calc non Af Amer: 55 mL/min — ABNORMAL LOW (ref >90)
Glucose, Bld: 101 mg/dL — ABNORMAL HIGH (ref 70–99)
Potassium: 4.1 mEq/L (ref 3.7–5.3)
Sodium: 140 mEq/L (ref 137–147)
Total Bilirubin: 0.2 mg/dL — ABNORMAL LOW (ref 0.3–1.2)
Total Protein: 8 g/dL (ref 6.0–8.3)

## 2013-08-05 LAB — CBC
HCT: 36.8 % (ref 36.0–46.0)
Hemoglobin: 12.2 g/dL (ref 12.0–15.0)
MCH: 29.8 pg (ref 26.0–34.0)
MCHC: 33.2 g/dL (ref 30.0–36.0)
MCV: 90 fL (ref 78.0–100.0)
Platelets: 379 10*3/uL (ref 150–400)
RBC: 4.09 MIL/uL (ref 3.87–5.11)
RDW: 14.6 % (ref 11.5–15.5)
WBC: 9.2 10*3/uL (ref 4.0–10.5)

## 2013-08-05 LAB — SEDIMENTATION RATE: Sed Rate: 34 mm/hr — ABNORMAL HIGH (ref 0–22)

## 2013-08-05 LAB — APTT: aPTT: 35 seconds (ref 24–37)

## 2013-08-05 LAB — TYPE AND SCREEN
ABO/RH(D): A POS
Antibody Screen: NEGATIVE

## 2013-08-05 LAB — URINALYSIS, ROUTINE W REFLEX MICROSCOPIC
Glucose, UA: NEGATIVE mg/dL
Hgb urine dipstick: NEGATIVE
Ketones, ur: NEGATIVE mg/dL
Nitrite: NEGATIVE
Protein, ur: NEGATIVE mg/dL
Specific Gravity, Urine: 1.031 — ABNORMAL HIGH (ref 1.005–1.030)
Urobilinogen, UA: 1 mg/dL (ref 0.0–1.0)
pH: 5 (ref 5.0–8.0)

## 2013-08-05 LAB — URINE MICROSCOPIC-ADD ON

## 2013-08-05 LAB — PROTIME-INR
INR: 0.99 (ref 0.00–1.49)
Prothrombin Time: 12.9 seconds (ref 11.6–15.2)

## 2013-08-05 LAB — SURGICAL PCR SCREEN
MRSA, PCR: NEGATIVE
Staphylococcus aureus: NEGATIVE

## 2013-08-05 LAB — C-REACTIVE PROTEIN: CRP: 2.3 mg/dL — ABNORMAL HIGH (ref <0.60)

## 2013-08-05 MED ORDER — CHLORHEXIDINE GLUCONATE 4 % EX LIQD: 60.0000 mL | Freq: Once | CUTANEOUS | Status: DC

## 2013-08-05 NOTE — Progress Notes (Addendum)
Dr.Paula Harrington Challenger is Cardiologist with last visit in Jan 2015-visit in epic  Denies ever having a stress test  Denies CXR in past yr  Medical Md is Dr.Veita Criss Rosales  Echo in epic from 2013  Heart cath in epic from 2013  EKG in epic from 03-30-13

## 2013-08-05 NOTE — Pre-Procedure Instructions (Signed)
Debbie Mullins  08/05/2013   Your procedure is scheduled on:  Mon, June 1 @ 12:30 PM  Report to [facility]  at 10:30 AM.  Call this number if you have problems the morning of surgery: [phone]   Remember:   Do not eat food or drink liquids after midnight.   Take these medicines the morning of surgery with A SIP OF WATER: Albuterol<Bring Your Inhaler With You>,Wellbutrin(Bupropion),Nexium(Esomeprazole),and Pain Pill(if needed)               Stop taking your Aspirin. No Goody's,BC's,Aleve,Ibuprofen,Fish Oil,or any Herbal Medications    Do not wear jewelry, make-up or nail polish.  Do not wear lotions, powders, or perfumes. You may wear deodorant.  Do not shave 48 hours prior to surgery.   Do not bring valuables to the hospital.  [facility] is not responsible                  for any belongings or valuables.               Contacts, dentures or bridgework may not be worn into surgery.  Leave suitcase in the car. After surgery it may be brought to your room.  For patients admitted to the hospital, discharge time is determined by your                treatment team.                   Special Instructions:  [place] - Preparing for Surgery  Before surgery, you can play an important role.  Because skin is not sterile, your skin needs to be as free of germs as possible.  You can reduce the number of germs on you skin by washing with CHG (chlorahexidine gluconate) soap before surgery.  CHG is an antiseptic cleaner which kills germs and bonds with the skin to continue killing germs even after washing.  Please DO NOT use if you have an allergy to CHG or antibacterial soaps.  If your skin becomes reddened/irritated stop using the CHG and inform your nurse when you arrive at Short Stay.  Do not shave (including legs and underarms) for at least 48 hours prior to the first CHG shower.  You may shave your face.  Please follow these instructions carefully:   1.  Shower with CHG  Soap the night before surgery and the                                morning of Surgery.  2.  If you choose to wash your hair, wash your hair first as usual with your       normal shampoo.  3.  After you shampoo, rinse your hair and body thoroughly to remove the                      Shampoo.  4.  Use CHG as you would any other liquid soap.  You can apply chg directly       to the skin and wash gently with scrungie or a clean washcloth.  5.  Apply the CHG Soap to your body ONLY FROM THE NECK DOWN.        Do not use on open wounds or open sores.  Avoid contact with your eyes,       ears, mouth and genitals (private parts).  Wash  genitals (private parts)       with your normal soap.  6.  Wash thoroughly, paying special attention to the area where your surgery        will be performed.  7.  Thoroughly rinse your body with warm water from the neck down.  8.  DO NOT shower/wash with your normal soap after using and rinsing off       the CHG Soap.  9.  Pat yourself dry with a clean towel.            10.  Wear clean pajamas.            11.  Place clean sheets on your bed the night of your first shower and do not        sleep with pets.  Day of Surgery  Do not apply any lotions/deoderants the morning of surgery.  Please wear clean clothes to the hospital/surgery center.     Please read over the following fact sheets that you were given: Pain Booklet, Coughing and Deep Breathing, Blood Transfusion Information, MRSA Information and Surgical Site Infection Prevention

## 2013-08-07 ENCOUNTER — Other Ambulatory Visit (HOSPITAL_COMMUNITY): Payer: Medicare Other

## 2013-08-07 NOTE — H&P (Signed)
TOTAL KNEE REVISION ADMISSION H&P  Patient is being admitted for left revision total knee arthroplasty.  Subjective:  Chief Complaint:left knee pain.  HPI: Debbie Mullins, 58 y.o. female, has a history of pain and functional disability in the left knee(s) due to failed previous arthroplasty and patient has failed non-surgical conservative treatments for greater than 12 weeks to include NSAID's and/or analgesics, use of assistive devices and activity modification. The indications for the revision of the total knee arthroplasty are loosening of one or more components and broken poly, subluxation and severe valgus of the left knee.. Onset of symptoms was gradual starting 1 years ago with worsening after a fall in early April of 2015 course since that time.  Prior procedures on the left knee(s) include arthroplasty.  Patient currently rates pain in the left knee(s) at 6 out of 10 with activity. There is worsening of pain with activity and weight bearing and pain that interferes with activities of daily living.  Patient has evidence of joint subluxation and prosthetic loosening by imaging studies. This condition presents safety issues increasing the risk of falls. This patient had sed rate and CRP which were WNL  There is no current active infection.  Patient Active Problem List   Diagnosis Date Noted  . HLD (hyperlipidemia) 01/22/2012  . Pulmonary hypertension 01/08/2012  . Acute on chronic diastolic heart failure 56/31/4970  . Acute respiratory failure with hypoxia 01/03/2012  . Pulmonary edema, acute 01/03/2012  . 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 HTN (hypertension) 01/02/2012  . COPD with acute exacerbation 01/02/2012  . Tobacco abuse 07/04/2011  . 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 Leukocytosis 02/09/2011  . Normocytic anemia 02/09/2011  . Mild Thrombocytosis 02/09/2011  . COPD (chronic obstructive pulmonary disease) 02/09/2011  . Obesity 02/09/2011   Past Medical History   Diagnosis Date  . Anemia   . Leukocytosis 02/09/2011  . Normocytic anemia 02/09/2011  . Thrombocytosis 02/09/2011  . COPD (chronic obstructive pulmonary disease)     Albuterol inhaler prn;SIngulair at night  . Depression     takes Wellbutrin daily  . GERD (gastroesophageal reflux disease)     takes Nexium daily  . Hypertension     takes Lisinopril daily  . Diabetes mellitus     takes Metformin daily  . Asthma   . Pneumonia     many yrs ago  . History of colon polyps   . Urinary frequency   . Urinary urgency   . Peripheral edema     takes Furosemide daily  . CHF (congestive heart failure)     takes Furosemide daily     Past Surgical History  Procedure Laterality Date  . Total knee arthroplasty Bilateral   . Tonsillectomy      as a child  . Colonoscopy      No prescriptions prior to admission   No Known Allergies  History  Substance Use Topics  . Smoking status: Current Every Day Smoker -- 0.50 packs/day for 40 years    Types: Cigarettes  . Smokeless tobacco: Never Used  . Alcohol Use: Yes     Comment: wine    Family History  Problem Relation Age of Onset  . Heart failure Mother   . Cancer Maternal Grandmother       Review of Systems  Musculoskeletal: Positive for joint pain.       Knee pain and dysfunction  All other systems reviewed and are negative.    Objective:  Physical  Exam  Constitutional: She is oriented to person, place, and time. She appears well-developed and well-nourished.  HENT:  Head: Normocephalic and atraumatic.  Eyes: EOM are normal. Pupils are equal, round, and reactive to light.  Neck: Normal range of motion.  Cardiovascular: Normal rate, regular rhythm and normal heart sounds.   Respiratory: Effort normal and breath sounds normal.  GI: Soft.  Musculoskeletal:  Left quad is active.  Valgus and medial collateral ligament loosening.  32 degrees of valgus deformity.  Neurological: She is alert and oriented to person, place, and  time.  Skin: Skin is warm and dry.  Psychiatric: She has a normal mood and affect.    Vital signs in last 24 hours:    Labs:  Estimated body mass index is 41.28 kg/(m^2) as calculated from the following:   Height as of 03/30/13: 5\' 3"  (1.6 m).   Weight as of 03/30/13: 105.688 kg (233 lb).  Imaging Review Plain radiographs demonstrate including oblique,standing AP and lateral which show subluxation of 3 cm of the femur posteriorly on the tibia, broken poly and tibial tray loosening. of the left knee(s). The overall alignment is 32 degrees of valgus.There is evidence of loosening of the tibial components. The bone quality appears to be adequate for age and reported activity level.  Assessment/Plan:  End stage arthritis, left knee(s) with failed previous arthroplasty.   The patient history, physical examination, clinical judgment of the provider and imaging studies are consistent with end stage degenerative joint disease of the left knee(s), previous total knee arthroplasty. Revision total knee arthroplasty is deemed medically necessary. The treatment options including medical management, injection therapy, arthroscopy and revision arthroplasty were discussed at length. The risks and benefits of revision total knee arthroplasty were presented and reviewed. The risks due to aseptic loosening, infection, stiffness, patella tracking problems, thromboembolic complications and other imponderables were discussed. The patient acknowledged the explanation, agreed to proceed with the plan and consent was signed. Patient is being admitted for inpatient treatment for surgery, pain control, PT, OT, prophylactic antibiotics, VTE prophylaxis, progressive ambulation and ADL's and discharge planning.The patient is planning to be discharged home with home health services

## 2013-08-09 ENCOUNTER — Encounter (HOSPITAL_COMMUNITY): Payer: Self-pay | Admitting: Oncology

## 2013-08-09 DIAGNOSIS — R918 Other nonspecific abnormal finding of lung field: Secondary | ICD-10-CM | POA: Insufficient documentation

## 2013-08-09 HISTORY — DX: Other nonspecific abnormal finding of lung field: R91.8

## 2013-08-09 MED ORDER — CEFAZOLIN SODIUM-DEXTROSE 2-3 GM-% IV SOLR
2.0000 g | INTRAVENOUS | Status: AC
  Administered 2013-08-10: 2 g via INTRAVENOUS
  Filled 2013-08-09: qty 50

## 2013-08-09 NOTE — Progress Notes (Signed)
No show

## 2013-08-10 ENCOUNTER — Inpatient Hospital Stay (HOSPITAL_COMMUNITY): Payer: Medicare Other | Admitting: Anesthesiology

## 2013-08-10 ENCOUNTER — Encounter (HOSPITAL_COMMUNITY): Payer: Self-pay | Admitting: *Deleted

## 2013-08-10 ENCOUNTER — Inpatient Hospital Stay (HOSPITAL_COMMUNITY): Payer: Medicare Other

## 2013-08-10 ENCOUNTER — Encounter (HOSPITAL_COMMUNITY): Payer: Medicare Other | Admitting: Anesthesiology

## 2013-08-10 ENCOUNTER — Encounter (HOSPITAL_COMMUNITY)
Admission: RE | Disposition: A | Discharge status: Skilled Nursing Facility | Payer: Self-pay | Source: Ambulatory Visit | Attending: Orthopaedic Surgery

## 2013-08-10 ENCOUNTER — Ambulatory Visit (HOSPITAL_COMMUNITY): Payer: Medicare Other | Admitting: Oncology

## 2013-08-10 ENCOUNTER — Inpatient Hospital Stay (HOSPITAL_COMMUNITY)
Admission: RE | Admit: 2013-08-10 | Discharge: 2013-08-12 | DRG: 467 | Disposition: A | Discharge status: Skilled Nursing Facility | Payer: Medicare Other | Source: Ambulatory Visit | Attending: Orthopaedic Surgery | Admitting: Orthopaedic Surgery

## 2013-08-10 DIAGNOSIS — Y831 Surgical operation with implant of artificial internal device as the cause of abnormal reaction of the patient, or of later complication, without mention of misadventure at the time of the procedure: Secondary | ICD-10-CM | POA: Diagnosis present

## 2013-08-10 DIAGNOSIS — T84093A Other mechanical complication of internal left knee prosthesis, initial encounter: Secondary | ICD-10-CM

## 2013-08-10 DIAGNOSIS — I5032 Chronic diastolic (congestive) heart failure: Secondary | ICD-10-CM | POA: Diagnosis present

## 2013-08-10 DIAGNOSIS — Z6841 Body Mass Index (BMI) 40.0 and over, adult: Secondary | ICD-10-CM

## 2013-08-10 DIAGNOSIS — Z96659 Presence of unspecified artificial knee joint: Secondary | ICD-10-CM

## 2013-08-10 DIAGNOSIS — F17200 Nicotine dependence, unspecified, uncomplicated: Secondary | ICD-10-CM | POA: Diagnosis present

## 2013-08-10 DIAGNOSIS — T84039A Mechanical loosening of unspecified internal prosthetic joint, initial encounter: Principal | ICD-10-CM | POA: Diagnosis present

## 2013-08-10 HISTORY — PX: KNEE ARTHROPLASTY: SHX992

## 2013-08-10 LAB — DIFFERENTIAL
Basophils Absolute: 0 10*3/uL (ref 0.0–0.1)
Basophils Relative: 0 % (ref 0–1)
Eosinophils Absolute: 0.1 10*3/uL (ref 0.0–0.7)
Eosinophils Relative: 1 % (ref 0–5)
Lymphocytes Relative: 31 % (ref 12–46)
Lymphs Abs: 3.1 10*3/uL (ref 0.7–4.0)
Monocytes Absolute: 0.4 10*3/uL (ref 0.1–1.0)
Monocytes Relative: 4 % (ref 3–12)
Neutro Abs: 6.2 10*3/uL (ref 1.7–7.7)
Neutrophils Relative %: 64 % (ref 43–77)

## 2013-08-10 LAB — GLUCOSE, CAPILLARY
Glucose-Capillary: 92 mg/dL (ref 70–99)
Glucose-Capillary: 82 mg/dL (ref 70–99)
Glucose-Capillary: 153 mg/dL — ABNORMAL HIGH (ref 70–99)
Glucose-Capillary: 103 mg/dL — ABNORMAL HIGH (ref 70–99)

## 2013-08-10 SURGERY — ARTHROPLASTY, KNEE, TOTAL, USING IMAGELESS COMPUTER-ASSISTED NAVIGATION
Anesthesia: General | Site: Knee | Laterality: Left

## 2013-08-10 MED ORDER — ALBUTEROL SULFATE (2.5 MG/3ML) 0.083% IN NEBU: 2.5000 mg | INHALATION_SOLUTION | Freq: Four times a day (QID) | RESPIRATORY_TRACT | Status: DC | PRN

## 2013-08-10 MED ORDER — ACETAMINOPHEN 325 MG PO TABS
650.0000 mg | ORAL_TABLET | Freq: Four times a day (QID) | ORAL | Status: DC | PRN
  Administered 2013-08-11 (×2): 650 mg via ORAL
  Filled 2013-08-10 (×2): qty 2

## 2013-08-10 MED ORDER — OXYCODONE HCL 5 MG PO TABS
5.0000 mg | ORAL_TABLET | ORAL | Status: DC | PRN
  Administered 2013-08-10 – 2013-08-12 (×7): 10 mg via ORAL
  Filled 2013-08-10 (×7): qty 2

## 2013-08-10 MED ORDER — BUPROPION HCL ER (XL) 150 MG PO TB24
150.0000 mg | ORAL_TABLET | Freq: Every day | ORAL | Status: DC
  Administered 2013-08-11 – 2013-08-12 (×2): 150 mg via ORAL
  Filled 2013-08-10 (×3): qty 1

## 2013-08-10 MED ORDER — ONDANSETRON HCL 4 MG/2ML IJ SOLN: 4.0000 mg | Freq: Once | INTRAMUSCULAR | Status: DC | PRN

## 2013-08-10 MED ORDER — ONDANSETRON HCL 4 MG/2ML IJ SOLN
INTRAMUSCULAR | Status: AC
  Filled 2013-08-10: qty 2

## 2013-08-10 MED ORDER — PROPOFOL 10 MG/ML IV BOLUS
INTRAVENOUS | Status: DC | PRN
  Administered 2013-08-10: 140 mg via INTRAVENOUS

## 2013-08-10 MED ORDER — NEOSTIGMINE METHYLSULFATE 10 MG/10ML IV SOLN
INTRAVENOUS | Status: DC | PRN
  Administered 2013-08-10: 3 mg via INTRAVENOUS

## 2013-08-10 MED ORDER — SODIUM CHLORIDE 0.9 % IR SOLN
Status: DC | PRN
  Administered 2013-08-10: 3000 mL

## 2013-08-10 MED ORDER — MORPHINE SULFATE 2 MG/ML IJ SOLN
2.0000 mg | INTRAMUSCULAR | Status: DC | PRN
  Administered 2013-08-10 – 2013-08-12 (×3): 2 mg via INTRAVENOUS
  Filled 2013-08-10 (×4): qty 1

## 2013-08-10 MED ORDER — OXYCODONE HCL 5 MG PO TABS: 5.0000 mg | ORAL_TABLET | Freq: Once | ORAL | Status: DC | PRN

## 2013-08-10 MED ORDER — ASPIRIN EC 325 MG PO TBEC: 325.0000 mg | DELAYED_RELEASE_TABLET | Freq: Every day | ORAL | Status: DC

## 2013-08-10 MED ORDER — FLEET ENEMA 7-19 GM/118ML RE ENEM: 1.0000 | ENEMA | Freq: Once | RECTAL | Status: AC | PRN

## 2013-08-10 MED ORDER — BUPIVACAINE HCL (PF) 0.25 % IJ SOLN
INTRAMUSCULAR | Status: AC
  Filled 2013-08-10: qty 30

## 2013-08-10 MED ORDER — POTASSIUM CHLORIDE IN NACL 20-0.9 MEQ/L-% IV SOLN
INTRAVENOUS | Status: DC
  Administered 2013-08-10: 19:00:00 via INTRAVENOUS
  Filled 2013-08-10 (×4): qty 1000

## 2013-08-10 MED ORDER — PANTOPRAZOLE SODIUM 40 MG PO TBEC
80.0000 mg | DELAYED_RELEASE_TABLET | Freq: Every day | ORAL | Status: DC
  Administered 2013-08-11 – 2013-08-12 (×2): 80 mg via ORAL
  Filled 2013-08-10 (×2): qty 2

## 2013-08-10 MED ORDER — FUROSEMIDE 20 MG PO TABS
20.0000 mg | ORAL_TABLET | ORAL | Status: DC
  Administered 2013-08-11: 20 mg via ORAL
  Filled 2013-08-10: qty 1

## 2013-08-10 MED ORDER — GLYCOPYRROLATE 0.2 MG/ML IJ SOLN
INTRAMUSCULAR | Status: DC | PRN
  Administered 2013-08-10: 0.4 mg via INTRAVENOUS

## 2013-08-10 MED ORDER — LISINOPRIL 5 MG PO TABS
5.0000 mg | ORAL_TABLET | Freq: Every day | ORAL | Status: DC
  Administered 2013-08-11 – 2013-08-12 (×2): 5 mg via ORAL
  Filled 2013-08-10 (×3): qty 1

## 2013-08-10 MED ORDER — DOCUSATE SODIUM 100 MG PO CAPS
100.0000 mg | ORAL_CAPSULE | Freq: Two times a day (BID) | ORAL | Status: DC
  Administered 2013-08-10 – 2013-08-12 (×4): 100 mg via ORAL
  Filled 2013-08-10 (×4): qty 1

## 2013-08-10 MED ORDER — OXYCODONE HCL 5 MG/5ML PO SOLN: 5.0000 mg | Freq: Once | ORAL | Status: AC | PRN

## 2013-08-10 MED ORDER — METFORMIN HCL 500 MG PO TABS
500.0000 mg | ORAL_TABLET | Freq: Three times a day (TID) | ORAL | Status: DC
  Administered 2013-08-10 – 2013-08-12 (×6): 500 mg via ORAL
  Filled 2013-08-10 (×8): qty 1

## 2013-08-10 MED ORDER — MENTHOL 3 MG MT LOZG: 1.0000 | LOZENGE | OROMUCOSAL | Status: DC | PRN

## 2013-08-10 MED ORDER — OXYCODONE HCL 5 MG/5ML PO SOLN: 5.0000 mg | Freq: Once | ORAL | Status: DC | PRN

## 2013-08-10 MED ORDER — METHOCARBAMOL 500 MG PO TABS: 500.0000 mg | ORAL_TABLET | Freq: Four times a day (QID) | ORAL | Status: DC | PRN

## 2013-08-10 MED ORDER — SENNOSIDES-DOCUSATE SODIUM 8.6-50 MG PO TABS: 1.0000 | ORAL_TABLET | Freq: Every evening | ORAL | Status: DC | PRN

## 2013-08-10 MED ORDER — ONDANSETRON HCL 4 MG PO TABS: 4.0000 mg | ORAL_TABLET | Freq: Four times a day (QID) | ORAL | Status: DC | PRN

## 2013-08-10 MED ORDER — ROCURONIUM BROMIDE 100 MG/10ML IV SOLN
INTRAVENOUS | Status: DC | PRN
  Administered 2013-08-10: 40 mg via INTRAVENOUS

## 2013-08-10 MED ORDER — MONTELUKAST SODIUM 10 MG PO TABS
10.0000 mg | ORAL_TABLET | Freq: Every day | ORAL | Status: DC
  Administered 2013-08-10 – 2013-08-11 (×2): 10 mg via ORAL
  Filled 2013-08-10 (×4): qty 1

## 2013-08-10 MED ORDER — FENTANYL CITRATE 0.05 MG/ML IJ SOLN
INTRAMUSCULAR | Status: DC | PRN
  Administered 2013-08-10: 50 ug via INTRAVENOUS
  Administered 2013-08-10: 25 ug via INTRAVENOUS
  Administered 2013-08-10: 100 ug via INTRAVENOUS
  Administered 2013-08-10: 25 ug via INTRAVENOUS
  Administered 2013-08-10: 50 ug via INTRAVENOUS

## 2013-08-10 MED ORDER — INSULIN ASPART 100 UNIT/ML ~~LOC~~ SOLN: 0.0000 [IU] | Freq: Three times a day (TID) | SUBCUTANEOUS | Status: DC

## 2013-08-10 MED ORDER — OXYCODONE-ACETAMINOPHEN 5-325 MG PO TABS: 1.0000 | ORAL_TABLET | ORAL | Status: DC | PRN

## 2013-08-10 MED ORDER — OXYCODONE HCL 5 MG PO TABS
5.0000 mg | ORAL_TABLET | Freq: Once | ORAL | Status: AC | PRN
  Administered 2013-08-10: 5 mg via ORAL

## 2013-08-10 MED ORDER — LABETALOL HCL 5 MG/ML IV SOLN
INTRAVENOUS | Status: DC | PRN
  Administered 2013-08-10 (×3): 5 mg via INTRAVENOUS

## 2013-08-10 MED ORDER — NICOTINE 21 MG/24HR TD PT24
21.0000 mg | MEDICATED_PATCH | Freq: Every day | TRANSDERMAL | Status: DC
  Administered 2013-08-10 – 2013-08-12 (×3): 21 mg via TRANSDERMAL
  Filled 2013-08-10 (×3): qty 1

## 2013-08-10 MED ORDER — HYDROMORPHONE HCL PF 1 MG/ML IJ SOLN
0.2500 mg | INTRAMUSCULAR | Status: DC | PRN
  Administered 2013-08-10: 0.5 mg via INTRAVENOUS

## 2013-08-10 MED ORDER — MIDAZOLAM HCL 5 MG/ML IJ SOLN: 1.0000 mg | Freq: Once | INTRAMUSCULAR | Status: DC

## 2013-08-10 MED ORDER — MIDAZOLAM HCL 2 MG/2ML IJ SOLN
INTRAMUSCULAR | Status: AC
  Administered 2013-08-10: 1 mg
  Filled 2013-08-10: qty 2

## 2013-08-10 MED ORDER — METHOCARBAMOL 1000 MG/10ML IJ SOLN
500.0000 mg | Freq: Four times a day (QID) | INTRAVENOUS | Status: DC | PRN
  Filled 2013-08-10: qty 5

## 2013-08-10 MED ORDER — ARTIFICIAL TEARS OP OINT
TOPICAL_OINTMENT | OPHTHALMIC | Status: DC | PRN
  Administered 2013-08-10: 1 via OPHTHALMIC

## 2013-08-10 MED ORDER — PNEUMOCOCCAL VAC POLYVALENT 25 MCG/0.5ML IJ INJ
0.5000 mL | INJECTION | INTRAMUSCULAR | Status: AC
  Administered 2013-08-12: 0.5 mL via INTRAMUSCULAR
  Filled 2013-08-10 (×2): qty 0.5

## 2013-08-10 MED ORDER — LIDOCAINE HCL (CARDIAC) 20 MG/ML IV SOLN
INTRAVENOUS | Status: AC
Start: 2013-08-10 — End: 2013-08-10
  Filled 2013-08-10: qty 5

## 2013-08-10 MED ORDER — PHENOL 1.4 % MT LIQD: 1.0000 | OROMUCOSAL | Status: DC | PRN

## 2013-08-10 MED ORDER — ACETAMINOPHEN 650 MG RE SUPP: 650.0000 mg | Freq: Four times a day (QID) | RECTAL | Status: DC | PRN

## 2013-08-10 MED ORDER — METOCLOPRAMIDE HCL 5 MG/ML IJ SOLN: 5.0000 mg | Freq: Three times a day (TID) | INTRAMUSCULAR | Status: DC | PRN

## 2013-08-10 MED ORDER — MIDAZOLAM HCL 2 MG/2ML IJ SOLN
INTRAMUSCULAR | Status: AC
  Filled 2013-08-10: qty 2

## 2013-08-10 MED ORDER — LACTATED RINGERS IV SOLN
INTRAVENOUS | Status: DC
  Administered 2013-08-10: 11:00:00 via INTRAVENOUS

## 2013-08-10 MED ORDER — HYDROMORPHONE HCL PF 1 MG/ML IJ SOLN
INTRAMUSCULAR | Status: AC
  Filled 2013-08-10: qty 1

## 2013-08-10 MED ORDER — SODIUM CHLORIDE 0.9 % IR SOLN
Status: DC | PRN
  Administered 2013-08-10: 1000 mL

## 2013-08-10 MED ORDER — BISACODYL 10 MG RE SUPP: 10.0000 mg | Freq: Every day | RECTAL | Status: DC | PRN

## 2013-08-10 MED ORDER — GLYCOPYRROLATE 0.2 MG/ML IJ SOLN
INTRAMUSCULAR | Status: AC
  Filled 2013-08-10: qty 2

## 2013-08-10 MED ORDER — FENTANYL CITRATE 0.05 MG/ML IJ SOLN
INTRAMUSCULAR | Status: AC
  Filled 2013-08-10: qty 2

## 2013-08-10 MED ORDER — METHOCARBAMOL 500 MG PO TABS
500.0000 mg | ORAL_TABLET | Freq: Four times a day (QID) | ORAL | Status: DC | PRN
  Administered 2013-08-10 – 2013-08-12 (×6): 500 mg via ORAL
  Filled 2013-08-10 (×5): qty 1

## 2013-08-10 MED ORDER — ASPIRIN EC 325 MG PO TBEC
325.0000 mg | DELAYED_RELEASE_TABLET | Freq: Every day | ORAL | Status: DC
  Administered 2013-08-11 – 2013-08-12 (×2): 325 mg via ORAL
  Filled 2013-08-10 (×3): qty 1

## 2013-08-10 MED ORDER — KETOROLAC TROMETHAMINE 15 MG/ML IJ SOLN
15.0000 mg | Freq: Four times a day (QID) | INTRAMUSCULAR | Status: AC
  Administered 2013-08-10 – 2013-08-11 (×4): 15 mg via INTRAVENOUS
  Filled 2013-08-10 (×3): qty 1

## 2013-08-10 MED ORDER — LABETALOL HCL 5 MG/ML IV SOLN
INTRAVENOUS | Status: AC
  Filled 2013-08-10: qty 4

## 2013-08-10 MED ORDER — NEOSTIGMINE METHYLSULFATE 10 MG/10ML IV SOLN
INTRAVENOUS | Status: AC
  Filled 2013-08-10: qty 1

## 2013-08-10 MED ORDER — HYDROMORPHONE HCL PF 1 MG/ML IJ SOLN
0.2500 mg | INTRAMUSCULAR | Status: DC | PRN
  Administered 2013-08-10 (×3): 0.5 mg via INTRAVENOUS

## 2013-08-10 MED ORDER — FENTANYL CITRATE 0.05 MG/ML IJ SOLN
50.0000 ug | Freq: Once | INTRAMUSCULAR | Status: AC
  Administered 2013-08-10: 50 ug via INTRAVENOUS

## 2013-08-10 MED ORDER — LIDOCAINE HCL (CARDIAC) 20 MG/ML IV SOLN
INTRAVENOUS | Status: DC | PRN
  Administered 2013-08-10: 40 mg via INTRAVENOUS

## 2013-08-10 MED ORDER — FENTANYL CITRATE 0.05 MG/ML IJ SOLN
INTRAMUSCULAR | Status: AC
  Filled 2013-08-10: qty 5

## 2013-08-10 MED ORDER — DIPHENHYDRAMINE HCL 12.5 MG/5ML PO ELIX
12.5000 mg | ORAL_SOLUTION | ORAL | Status: DC | PRN
Start: 2013-08-10 — End: 2013-08-12

## 2013-08-10 MED ORDER — METOCLOPRAMIDE HCL 10 MG PO TABS: 5.0000 mg | ORAL_TABLET | Freq: Three times a day (TID) | ORAL | Status: DC | PRN

## 2013-08-10 MED ORDER — ONDANSETRON HCL 4 MG/2ML IJ SOLN: 4.0000 mg | Freq: Four times a day (QID) | INTRAMUSCULAR | Status: DC | PRN

## 2013-08-10 MED ORDER — ROCURONIUM BROMIDE 50 MG/5ML IV SOLN
INTRAVENOUS | Status: AC
  Filled 2013-08-10: qty 1

## 2013-08-10 MED ORDER — LACTATED RINGERS IV SOLN
INTRAVENOUS | Status: DC | PRN
  Administered 2013-08-10 (×2): via INTRAVENOUS

## 2013-08-10 MED ORDER — PROPOFOL 10 MG/ML IV BOLUS
INTRAVENOUS | Status: AC
  Filled 2013-08-10: qty 20

## 2013-08-10 MED ORDER — ONDANSETRON HCL 4 MG/2ML IJ SOLN
INTRAMUSCULAR | Status: DC | PRN
Start: 2013-08-10 — End: 2013-08-10
  Administered 2013-08-10: 4 mg via INTRAVENOUS

## 2013-08-10 MED ORDER — ARTIFICIAL TEARS OP OINT
TOPICAL_OINTMENT | OPHTHALMIC | Status: AC
  Filled 2013-08-10: qty 3.5

## 2013-08-10 SURGICAL SUPPLY — 70 items
BANDAGE ELASTIC 4 VELCRO ST LF (GAUZE/BANDAGES/DRESSINGS) ×2 IMPLANT
BANDAGE ESMARK 6X9 LF (GAUZE/BANDAGES/DRESSINGS) ×1 IMPLANT
BENZOIN TINCTURE PRP APPL 2/3 (GAUZE/BANDAGES/DRESSINGS) ×2 IMPLANT
BLADE SAGITTAL 25.0X1.19X90 (BLADE) ×2 IMPLANT
BLADE SAGITTAL 25.0X1.27X90 (BLADE) ×2 IMPLANT
BLADE SAW SGTL 13X75X1.27 (BLADE) ×2 IMPLANT
BNDG ELASTIC 6X10 VLCR STRL LF (GAUZE/BANDAGES/DRESSINGS) ×2 IMPLANT
BNDG ESMARK 6X9 LF (GAUZE/BANDAGES/DRESSINGS) ×2
BOWL SMART MIX CTS (DISPOSABLE) ×2 IMPLANT
CAP UPCHARGE REVISION TRAY ×2 IMPLANT
CAPT RP KNEE ×2 IMPLANT
CEMENT HV SMART SET (Cement) ×4 IMPLANT
COVER BACK TABLE 24X17X13 BIG (DRAPES) IMPLANT
COVER MAYO STAND STRL (DRAPES) ×2 IMPLANT
COVER SURGICAL LIGHT HANDLE (MISCELLANEOUS) ×2 IMPLANT
CUFF TOURNIQUET SINGLE 34IN LL (TOURNIQUET CUFF) ×2 IMPLANT
CUFF TOURNIQUET SINGLE 44IN (TOURNIQUET CUFF) IMPLANT
DRAPE INCISE IOBAN 66X45 STRL (DRAPES) ×2 IMPLANT
DRAPE ORTHO SPLIT 77X108 STRL (DRAPES) ×2
DRAPE SURG ORHT 6 SPLT 77X108 (DRAPES) ×2 IMPLANT
DRAPE U-SHAPE 47X51 STRL (DRAPES) ×2 IMPLANT
DRSG PAD ABDOMINAL 8X10 ST (GAUZE/BANDAGES/DRESSINGS) ×2 IMPLANT
DURAPREP 26ML APPLICATOR (WOUND CARE) ×2 IMPLANT
ELECT REM PT RETURN 9FT ADLT (ELECTROSURGICAL) ×2
ELECTRODE REM PT RTRN 9FT ADLT (ELECTROSURGICAL) ×1 IMPLANT
FACESHIELD WRAPAROUND (MASK) ×2 IMPLANT
GAUZE XEROFORM 5X9 LF (GAUZE/BANDAGES/DRESSINGS) ×2 IMPLANT
GLOVE BIOGEL PI IND STRL 7.0 (GLOVE) ×4 IMPLANT
GLOVE BIOGEL PI IND STRL 7.5 (GLOVE) ×2 IMPLANT
GLOVE BIOGEL PI IND STRL 8 (GLOVE) ×1 IMPLANT
GLOVE BIOGEL PI INDICATOR 7.0 (GLOVE) ×4
GLOVE BIOGEL PI INDICATOR 7.5 (GLOVE) ×2
GLOVE BIOGEL PI INDICATOR 8 (GLOVE) ×1
GLOVE ECLIPSE 6.5 STRL STRAW (GLOVE) ×8 IMPLANT
GLOVE ECLIPSE 7.0 STRL STRAW (GLOVE) ×4 IMPLANT
GLOVE ORTHO TXT STRL SZ7.5 (GLOVE) ×2 IMPLANT
GOWN STRL REUS W/ TWL LRG LVL3 (GOWN DISPOSABLE) ×9 IMPLANT
GOWN STRL REUS W/ TWL XL LVL3 (GOWN DISPOSABLE) ×1 IMPLANT
GOWN STRL REUS W/TWL LRG LVL3 (GOWN DISPOSABLE) ×9
GOWN STRL REUS W/TWL XL LVL3 (GOWN DISPOSABLE) ×1
HANDPIECE INTERPULSE COAX TIP (DISPOSABLE) ×1
IMMOBILIZER KNEE 22 UNIV (SOFTGOODS) ×2 IMPLANT
KIT BASIN OR (CUSTOM PROCEDURE TRAY) ×2 IMPLANT
KIT ROOM TURNOVER OR (KITS) ×2 IMPLANT
MANIFOLD NEPTUNE II (INSTRUMENTS) ×2 IMPLANT
MARKER SPHERE PSV REFLC THRD 5 (MARKER) ×6 IMPLANT
NEEDLE 1/2 CIR MAYO (NEEDLE) ×2 IMPLANT
NEEDLE HYPO 25GX1X1/2 BEV (NEEDLE) ×2 IMPLANT
NS IRRIG 1000ML POUR BTL (IV SOLUTION) ×2 IMPLANT
PACK TOTAL JOINT (CUSTOM PROCEDURE TRAY) ×2 IMPLANT
PAD ARMBOARD 7.5X6 YLW CONV (MISCELLANEOUS) ×4 IMPLANT
PAD CAST 4YDX4 CTTN HI CHSV (CAST SUPPLIES) ×1 IMPLANT
PADDING CAST COTTON 4X4 STRL (CAST SUPPLIES) ×1
PADDING CAST COTTON 6X4 STRL (CAST SUPPLIES) ×2 IMPLANT
PIN SCHANZ 4MM 130MM (PIN) ×8 IMPLANT
SET HNDPC FAN SPRY TIP SCT (DISPOSABLE) ×1 IMPLANT
SPONGE GAUZE 4X4 12PLY (GAUZE/BANDAGES/DRESSINGS) ×2 IMPLANT
STAPLER VISISTAT 35W (STAPLE) ×2 IMPLANT
STRIP CLOSURE SKIN 1/2X4 (GAUZE/BANDAGES/DRESSINGS) ×2 IMPLANT
SUCTION FRAZIER TIP 10 FR DISP (SUCTIONS) ×2 IMPLANT
SUT ETHIBOND NAB CT1 #1 30IN (SUTURE) ×2 IMPLANT
SUT VIC AB 2-0 CT1 27 (SUTURE) ×4
SUT VIC AB 2-0 CT1 TAPERPNT 27 (SUTURE) ×4 IMPLANT
SUT VICRYL 4-0 PS2 18IN ABS (SUTURE) ×2 IMPLANT
SUT VICRYL AB 2 0 TIES (SUTURE) ×2 IMPLANT
SYR CONTROL 10ML LL (SYRINGE) ×2 IMPLANT
TOWEL OR 17X24 6PK STRL BLUE (TOWEL DISPOSABLE) ×2 IMPLANT
TOWEL OR 17X26 10 PK STRL BLUE (TOWEL DISPOSABLE) ×2 IMPLANT
TRAY FOLEY CATH 16FRSI W/METER (SET/KITS/TRAYS/PACK) ×2 IMPLANT
WATER STERILE IRR 1000ML POUR (IV SOLUTION) ×6 IMPLANT

## 2013-08-10 NOTE — Progress Notes (Signed)
Orthopedic Tech Progress Note Patient Details:  Debbie Mullins [birth year] [id number] CPM applied to LLE with appropriate settings. OHF applied to bed. Footsie roll provided. CPM Left Knee CPM Left Knee: On Left Knee Flexion (Degrees): 60 Left Knee Extension (Degrees): 0   Asia R Thompson 08/10/2013, 4:51 PM

## 2013-08-10 NOTE — Plan of Care (Signed)
Problem: Consults Goal: Diagnosis- Total Joint Replacement Primary Total Knee Left     

## 2013-08-10 NOTE — Anesthesia Procedure Notes (Addendum)
Procedure Name: Intubation Date/Time: 08/10/2013 1:06 PM Performed by: Scheryl Darter Pre-anesthesia Checklist: Patient identified, Emergency Drugs available, Suction available, Patient being monitored and Timeout performed Patient Re-evaluated:Patient Re-evaluated prior to inductionOxygen Delivery Method: Circle system utilized Preoxygenation: Pre-oxygenation with 100% oxygen Intubation Type: IV induction Ventilation: Mask ventilation without difficulty Laryngoscope Size: Mac and 3 Grade View: Grade I Tube type: Oral Tube size: 7.5 mm Number of attempts: 1 Airway Equipment and Method: Stylet Placement Confirmation: ETT inserted through vocal cords under direct vision,  positive ETCO2 and breath sounds checked- equal and bilateral Secured at: 22 cm Tube secured with: Tape Dental Injury: Teeth and Oropharynx as per pre-operative assessment    Anesthesia Regional Block:  Adductor canal block  Pre-Anesthetic Checklist: ,, timeout performed, Correct Patient, Correct Site, Correct Laterality, Correct Procedure, Correct Position, site marked, Risks and benefits discussed,  Surgical consent,  Pre-op evaluation,  At surgeon's request and post-op pain management  Laterality: Left  Prep: chloraprep       Needles:  Injection technique: Single-shot  Needle Type: Echogenic Stimulator Needle     Needle Length: 9cm 9 cm Needle Gauge: 22 and 22 G    Additional Needles: Adductor canal block Narrative:  Start time: 08/10/2013 1:00 PM End time: 08/10/2013 1:05 PM Injection made incrementally with aspirations every 5 mL.  Performed by: Personally   Additional Notes: 30 cc 0.5% marcaine with 1:200 Epi injected easily  Roberts Gaudy, MD

## 2013-08-10 NOTE — Anesthesia Postprocedure Evaluation (Signed)
  Anesthesia Post-op Note  Patient: Debbie Mullins  Procedure(s) Performed: Procedure(s) with comments:  TOTAL KNEE ARTHROPLASTY (Left) - Left Total Knee Arthroplasty Revision, Cemented, Semi-constrained,   Patient Location: PACU  Anesthesia Type:General and GA combined with regional for post-op pain  Level of Consciousness: awake, alert  and oriented  Airway and Oxygen Therapy: Patient Spontanous Breathing and Patient connected to nasal cannula oxygen  Post-op Pain: mild  Post-op Assessment: Post-op Vital signs reviewed, Patient's Cardiovascular Status Stable, Respiratory Function Stable, Patent Airway and Pain level controlled  Post-op Vital Signs: stable  Last Vitals:  Filed Vitals:   08/10/13 1735  BP: 100/71  Pulse: 75  Temp: 36.2 C  Resp: 10    Complications: No apparent anesthesia complications

## 2013-08-10 NOTE — Transfer of Care (Signed)
Immediate Anesthesia Transfer of Care Note  Patient: Debbie Mullins  Procedure(s) Performed: Procedure(s) with comments: COMPUTER ASSISTED TOTAL KNEE ARTHROPLASTY (Left) - Left Total Knee Arthroplasty Revision, Cemented, Semi-constrained, computer assist  Patient Location: PACU  Anesthesia Type:General  Level of Consciousness: awake, alert  and oriented  Airway & Oxygen Therapy: Patient Spontanous Breathing and Patient connected to nasal cannula oxygen  Post-op Assessment: Report given to PACU RN and Post -op Vital signs reviewed and stable  Post vital signs: Reviewed and stable  Complications: No apparent anesthesia complications

## 2013-08-10 NOTE — Brief Op Note (Cosign Needed)
08/10/2013  4:11 PM  PATIENT:  Debbie Mullins  58 y.o. female  PRE-OPERATIVE DIAGNOSIS:  Loose Left Total Knee Arthroplasty with Subluxation, Broken Poly  POST-OPERATIVE DIAGNOSIS:  Loose Left Total Knee Arthroplasty with Subluxation, Broken Poly  PROCEDURE:  Procedure(s) with comments: COMPUTER ASSISTED TOTAL KNEE ARTHROPLASTY (Left) - Left Total Knee Arthroplasty Revision, Cemented, Semi-constrained, computer assist  SURGEON:  Surgeon(s) and Role:    * [name], MD - Primary  PHYSICIAN ASSISTANT: Phillips Hay PAC  ASSISTANTS: none   ANESTHESIA:   general  EBL:  Total I/O In: 1000 [I.V.:1000] Out: 340 [Urine:290; Blood:50]  BLOOD ADMINISTERED:none  DRAINS: none   LOCAL MEDICATIONS USED:  NONE  SPECIMEN:  Left knee synovium  DISPOSITION OF SPECIMEN:  PATHOLOGY  COUNTS:  YES  TOURNIQUET:   Total Tourniquet Time Documented: Thigh (Left) - 97 minutes Total: Thigh (Left) - 97 minutes   DICTATION: .Note written in EPIC  PLAN OF CARE: Admit to inpatient   PATIENT DISPOSITION:  PACU - hemodynamically stable.   Delay start of Pharmacological VTE agent (>24hrs) due to surgical blood loss or risk of bleeding: no

## 2013-08-10 NOTE — Discharge Instructions (Signed)
Keep knee incision dry for 5 days post op then may wet while bathing. °Therapy daily and CPM goal full extension and greater than 90 degrees flexion. °Call if fever or chills or increased drainage. °Go to ER if acutely short of breath or call for ambulance. °Return for follow up in 2 weeks. °May full weight bear on the surgical leg unless told otherwise. °Use knee immobilizer until able to straight leg raise off bed with knee stable. °In house walking for first 2 weeks. °

## 2013-08-10 NOTE — Anesthesia Preprocedure Evaluation (Signed)
Anesthesia Evaluation  Patient identified by MRN, date of birth, ID band Patient awake    Reviewed: Allergy & Precautions, H&P , NPO status   Airway Mallampati: II TM Distance: >3 FB Neck ROM: Full    Dental  (+) Dental Advisory Given   Pulmonary asthma , pneumonia -, COPDCurrent Smoker,          Cardiovascular hypertension, +CHF     Neuro/Psych    GI/Hepatic GERD-  ,  Endo/Other  diabetes  Renal/GU Renal disease     Musculoskeletal   Abdominal   Peds  Hematology  (+) anemia ,   Anesthesia Other Findings   Reproductive/Obstetrics                           Anesthesia Physical Anesthesia Plan  ASA: III  Anesthesia Plan:    Post-op Pain Management: MAC Combined w/ Regional for Post-op pain   Induction: Intravenous  Airway Management Planned: Oral ETT  Additional Equipment:   Intra-op Plan:   Post-operative Plan: Extubation in OR  Informed Consent: I have reviewed the patients History and Physical, chart, labs and discussed the procedure including the risks, benefits and alternatives for the proposed anesthesia with the patient or authorized representative who has indicated his/her understanding and acceptance.   Dental advisory given  Plan Discussed with: Surgeon, Anesthesiologist and CRNA  Anesthesia Plan Comments:         Anesthesia Quick Evaluation

## 2013-08-11 LAB — CBC
HCT: 28.5 % — ABNORMAL LOW (ref 36.0–46.0)
Hemoglobin: 9.3 g/dL — ABNORMAL LOW (ref 12.0–15.0)
MCH: 29.2 pg (ref 26.0–34.0)
MCHC: 32.6 g/dL (ref 30.0–36.0)
MCV: 89.3 fL (ref 78.0–100.0)
Platelets: 290 10*3/uL (ref 150–400)
RBC: 3.19 MIL/uL — ABNORMAL LOW (ref 3.87–5.11)
RDW: 14.6 % (ref 11.5–15.5)
WBC: 9 10*3/uL (ref 4.0–10.5)

## 2013-08-11 LAB — BASIC METABOLIC PANEL
BUN: 24 mg/dL — ABNORMAL HIGH (ref 6–23)
CO2: 26 mEq/L (ref 19–32)
Calcium: 7.9 mg/dL — ABNORMAL LOW (ref 8.4–10.5)
Chloride: 102 mEq/L (ref 96–112)
Creatinine, Ser: 1.01 mg/dL (ref 0.50–1.10)
GFR calc Af Amer: 71 mL/min — ABNORMAL LOW (ref >90)
GFR calc non Af Amer: 61 mL/min — ABNORMAL LOW (ref >90)
Glucose, Bld: 107 mg/dL — ABNORMAL HIGH (ref 70–99)
Potassium: 4.8 mEq/L (ref 3.7–5.3)
Sodium: 137 mEq/L (ref 137–147)

## 2013-08-11 LAB — GLUCOSE, CAPILLARY
Glucose-Capillary: 110 mg/dL — ABNORMAL HIGH (ref 70–99)
Glucose-Capillary: 91 mg/dL (ref 70–99)
Glucose-Capillary: 98 mg/dL (ref 70–99)
Glucose-Capillary: 110 mg/dL — ABNORMAL HIGH (ref 70–99)

## 2013-08-11 NOTE — Evaluation (Signed)
Physical Therapy Evaluation Patient Details Name: Debbie Mullins MRN: [id number] DOB: 08/11/1955 Today's Date: 08/11/2013   History of Present Illness  58 y.o. female s/p left TKA. Hx of diabetes, HTN, and COPD.  Clinical Impression  Pt is s/p left TKA resulting in the deficits listed below (see PT Problem List). She is ambulating generally well up to 30 feet x 2 bouts at a min guard level. Her greatest difficulty is performing sit to stand transfers and due to patient having decreased support from her cousin at home, I feel she will greatly benefit from a short term stay at a SNF for further rehab to increase independence and safety with mobility. Pt is agreeable to this plan of care.     Follow Up Recommendations SNF;Supervision for mobility/OOB    Equipment Recommendations  Rolling walker with 5" wheels;3in1 (PT)    Recommendations for Other Services       Precautions / Restrictions Precautions Precautions: Knee Precaution Comments: Reviewed precautions Restrictions Weight Bearing Restrictions: Yes LLE Weight Bearing: Weight bearing as tolerated      Mobility  Bed Mobility Overal bed mobility: Needs Assistance Bed Mobility: Supine to Sit     Supine to sit: Supervision     General bed mobility comments: supervision for safety with verbal cues for technique. requires extra time but no physical assist needed  Transfers Overall transfer level: Needs assistance Equipment used: Rolling walker (2 wheeled) Transfers: Sit to/from Stand Sit to Stand: Min assist;+2 physical assistance         General transfer comment: Min assist +2 for boost to stand, progressed to min assist +1. Performed from lowest bed setting and several times from recliner. Verbal cues for hand placement and technique. Pt rocks several times for inertia to stand.  Ambulation/Gait Ambulation/Gait assistance: Min guard Ambulation Distance (Feet): 30 Feet (x2) Assistive device: Rolling walker (2  wheeled) Gait Pattern/deviations: Step-to pattern;Decreased stance time - left;Decreased step length - right;Antalgic;Trunk flexed Gait velocity: decreased   General Gait Details: Frequent verbal cues for sequencing and walker positioning. Min guard for safety and demonstrates good balance with RW. Required one seated rest break between ambulatory bouts, due to pain in RIGHT knee. no instances of knee buckling on left with knee immobilizer in place  Stairs            Wheelchair Mobility    Modified Rankin (Stroke Patients Only)       Balance Overall balance assessment: Needs assistance Sitting-balance support: No upper extremity supported;Feet unsupported Sitting balance-Leahy Scale: Fair Sitting balance - Comments: sits EOB without assist   Standing balance support: Bilateral upper extremity supported Standing balance-Leahy Scale: Poor Standing balance comment: Requires UE support to stand                             Pertinent Vitals/Pain Pt denies pain in operative knee (L) but states she has pain in right knee from arthritis Patient repositioned in chair for comfort.     Home Living Family/patient expects to be discharged to:: Unsure Living Arrangements: Other relatives (cousin) Available Help at Discharge: Family;Available 24 hours/day Type of Home: House Home Access: Level entry     Home Layout: One level Home Equipment: Walker - 4 wheels;Cane - single point      Prior Function Level of Independence: Independent with assistive device(s)         Comments: uses rollator and cane to ambulate     Hand  Dominance   Dominant Hand: Right    Extremity/Trunk Assessment   Upper Extremity Assessment: Defer to OT evaluation           Lower Extremity Assessment: LLE deficits/detail;RLE deficits/detail RLE Deficits / Details: decreased strength and ROM - states she has a lot of pain from arthritis in this knee LLE Deficits / Details: decreased  strength and ROM     Communication   Communication: No difficulties  Cognition Arousal/Alertness: Awake/alert Behavior During Therapy: WFL for tasks assessed/performed Overall Cognitive Status: Within Functional Limits for tasks assessed                      General Comments General comments (skin integrity, edema, etc.): Reviewed knee precautions and positioning optimal healing. Discussed d/c planning and pt feels she will benefit from short term SNF due to lack of support at home from cousin who has had a stroke in the past.    Exercises Total Joint Exercises Ankle Circles/Pumps: AROM;Both;10 reps;Supine Quad Sets: AROM;Left;10 reps;Supine General Exercises - Lower Extremity Mini-Sqauts: Strengthening;Both;5 reps;Standing      Assessment/Plan    PT Assessment Patient needs continued PT services  PT Diagnosis Difficulty walking;Abnormality of gait;Acute pain   PT Problem List Decreased strength;Decreased range of motion;Decreased activity tolerance;Decreased balance;Decreased mobility;Decreased knowledge of use of DME;Decreased knowledge of precautions;Pain;Obesity  PT Treatment Interventions DME instruction;Gait training;Functional mobility training;Therapeutic activities;Therapeutic exercise;Balance training;Neuromuscular re-education;Patient/family education;Modalities   PT Goals (Current goals can be found in the Care Plan section) Acute Rehab PT Goals Patient Stated Goal: go home PT Goal Formulation: With patient Time For Goal Achievement: 08/11/13 Potential to Achieve Goals: Good    Frequency 7X/week   Barriers to discharge Decreased caregiver support lives with cousin who has had a stroke in the past and will not be able to provide adequate care  for pt safety    Co-evaluation               End of Session Equipment Utilized During Treatment: Gait belt;Left knee immobilizer Activity Tolerance: Patient tolerated treatment well Patient left: in  chair;with call bell/phone within reach Nurse Communication: Mobility status         Time: 0920-0956 PT Time Calculation (min): 36 min   Charges:   PT Evaluation $Initial PT Evaluation Tier I: 1 Procedure PT Treatments $Gait Training: 8-22 mins $Therapeutic Activity: 8-22 mins   PT G CodesCamille Bal [place], [place]   Ellouise Newer 08/11/2013, 10:43 AM

## 2013-08-11 NOTE — Progress Notes (Signed)
Orthopedic Tech Progress Note Patient Details:  Debbie Mullins 1956/03/02 [id number]  Patient ID: [name], female   DOB: 1955-08-23, 58 y.o.   MRN: [id number] Placed pt's lle in cpm @ 0-60 degrees @1440   Klay Sobotka 08/11/2013, 2:41 PM

## 2013-08-11 NOTE — Progress Notes (Signed)
Subjective: 1 Day Post-Op Procedure(s) (LRB):  TOTAL KNEE ARTHROPLASTY (Left) Patient reports pain as 5 on 0-10 scale.    Objective: Vital signs in last 24 hours: Temp:  [97.2 F (36.2 C)-98.2 F (36.8 C)] 97.9 F (36.6 C) (06/02 0300) Pulse Rate:  [26-92] 70 (06/02 0300) Resp:  [10-28] 16 (06/02 0300) BP: (70-174)/(34-122) 116/51 mmHg (06/02 0300) SpO2:  [79 %-100 %] 100 % (06/02 0300) Weight:  [99.791 kg (220 lb)] 99.791 kg (220 lb) (06/01 1023)  Intake/Output from previous day: 06/01 0701 - 06/02 0700 In: 2630 [P.O.:480; I.V.:2150] Out: 480 [Urine:430; Blood:50] Intake/Output this shift:     Recent Labs  08/11/13 0624  HGB 9.3*    Recent Labs  08/11/13 0624  WBC 9.0  RBC 3.19*  HCT 28.5*  PLT 290    Recent Labs  08/11/13 0624  NA 137  K 4.8  CL 102  CO2 26  BUN 24*  CREATININE 1.01  GLUCOSE 107*  CALCIUM 7.9*   No results found for this basename: LABPT, INR,  in the last 72 hours  Neurologically intact intact pulses.   Assessment/Plan: 1 Day Post-Op Procedure(s) (LRB):  TOTAL KNEE ARTHROPLASTY (Left) Discharge to SNF expected since she has been minimally ambulatory for several years putting surgery off and getting weaker. She was able to ambulate with a can and subluxed knee with metalosis for a year and may do better than expected know that knee is straight and stable.   Debbie Mullins 08/11/2013, 7:36 AM

## 2013-08-11 NOTE — Progress Notes (Signed)
Utilization review completed.  

## 2013-08-11 NOTE — Care Management Note (Signed)
CARE MANAGEMENT NOTE 08/11/2013  Patient:  Debbie Mullins, Debbie Mullins   Account Number:  [id number]  Date Initiated:  08/11/2013  Documentation initiated by:  Ricki Miller  Subjective/Objective Assessment:   58 yr old female s/p left total knee arthroplasty.     Action/Plan:   Case manager spoke with patient concerning discharge plans. She states she lives with cousin who has had a stroke and cant assist her, will require SNF for shortterm rehab. Social worker aware. Patient states  [facility] her home.   Anticipated DC Date:  08/12/2013   Anticipated DC Plan:  SKILLED NURSING FACILITY  In-house referral  Clinical Social Worker      DC Planning Services  CM consult      Choice offered to / List presented to:             Status of service:  Completed, signed off Medicare Important Message given?  NA - LOS <3 / Initial given by admissions (If response is "NO", the following Medicare IM given date fields will be blank) Date Medicare IM given:  08/05/2013 Date Additional Medicare IM given:    Discharge Disposition:  [place]

## 2013-08-11 NOTE — Op Note (Signed)
Debbie Mullins, Debbie Mullins            ACCOUNT NO.:  [id number]  MEDICAL RECORD NO.:  [id number]  LOCATION:  5N20C                        FACILITY:  [facility]  PHYSICIAN:  Nevah Dalal C. Lorin Mercy, M.D.    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  PROCEDURE:  Left total knee arthroplasty revision.  SURGEON:  Adekunle Rohrbach C. Lorin Mercy, M.D.  ASSISTANT:  Epimenio Foot, PA-C, medically necessary and present for the entire procedure.  IMPLANTS:  DePuy revision femur with 8-mm lateral distal shim.  2.5 femur, 2.5 tibia, 17.5-mm polyethylene.  ANESTHESIA:  General with preoperative femoral nerve block.  BRIEF HISTORY:  A 58 year old female had previous total knee arthroplasty by Dr. Marily Memos, 10+ years ago.  For the last year and half, she has been walking with severe valgus subluxation and x-ray suggest either broken poly or dislodged poly with some tibial tray loosening and also 15-20 degrees valgus of the femoral prosthesis.  DESCRIPTION OF PROCEDURE:  After standard prepping and draping, proximal thigh tourniquet, preoperative Ancef prophylaxis 2 g, the Foley catheter had been placed.  Foot was prepped in case Dopplers needed to be checked.  Usual split sheets, drapes, impervious stockinette, Coban, time-out procedure was completed.  Leg was elevated, wrapped in Esmarch, tourniquet inflated.  Old midline incision was made.  Medial parapatellar incision was made.  Patella was everted and patella component was in satisfactory position with no evidence of loosening. Revision osteotomes were used around the edge.  There was some deficit on the lateral side as expected and  femoral component was removed with the distractor after going around the edges carefully to preserve as much bone.  Residual cement was removed primarily in the middle box region.  Tibial component had some loosening underneath the tray and revision osteotomes were used with the [place] retractor posteriorly and tibial component was removed impacting and breaking loose the prosthetic cement bond without actually disturbing the cement.  This allowed for using the [facility] wing and taken additional 4 mm with a 2-degree slope for additional stability and this resected after pinning the guide, the cement mantle and caught bone on both sides.  Some of the bone was still present in the [place], which had to be removed and then 2.5 trial was inserted after reaming and placing the wings.  Trial component on the femur, shims were tried and 8-mm shim was best on lateral side distally. There was some bone posteriorly.  All remnants of the PCL were gone. 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  No evidence of infection was present.  After trials were inserted, stability checked, there was still some slight laxity medial, but it was only mild.  There was good flexion- extension balance with medial and lateral balance in flexion at 90 degrees.  17.5 space was selected.  Permanent components were opened first.  Tibia was cemented first followed by the femur that had the 8-mm shim placed laterally.  All excessive cement had been removed and the permanent 17.5 femur was placed.  Spot pictures were taken.  X-rays that are pending development to confirm periprosthetic alignment.  Pulsatile irrigation, standard layered closure with nonabsorbable in the quad tendon, deep retinaculum, 2-0 on the subcutaneous tissue, skin closure, postop dressing and knee immobilizer.  Instrument count and needle count were  correct.     Zayaan Kozak C. Lorin Mercy, M.D.     MCY/MEDQ  D:  08/10/2013  T:  08/11/2013  Job:  [id number]

## 2013-08-11 NOTE — Clinical Social Work Placement (Addendum)
Clinical Social Work Department  CLINICAL SOCIAL WORK PLACEMENT NOTE  Patient: 08/11/2013 Account [id number] Admit date: 08/10/13  Clinical Social Worker: Rhea Pink LCSWA Date/time: 08/11/2013 3:30 PM  Clinical Social Work is seeking post-discharge placement for this patient at the following level of care: SKILLED NURSING (*CSW will update this form in Epic as items are completed)  08/11/2013 Patient/family provided with [place] Department of Clinical Social Work's list of facilities offering this level of care within the geographic area requested by the patient (or if unable, by the patient's family).  08/11/2013 Patient/family informed of their freedom to choose among providers that offer the needed level of care, that participate in Medicare, Medicaid or managed care program needed by the patient, have an available bed and are willing to accept the patient.  08/11/2013 Patient/family informed of [facility]' ownership interest in [facility], as well as of the fact that they are under no obligation to receive care at this facility.  PASARR submitted to EDS on 08/12/13 PASARR number received from EDS on 08/12/13 FL2 transmitted to all facilities in geographic area requested by pt/family on 08/11/2013  FL2 transmitted to all facilities within larger geographic area on  Patient informed that his/her managed care company has contracts with or will negotiate with certain facilities, including the following:  Patient/family informed of bed offers received: 08/12/13 Patient chooses bed at [facility] Physician recommends and patient chooses bed at  Patient to be transferred to on 08/12/13 Patient to be transferred to facility by [facility] The following physician request were entered in Epic:  Additional Comments:

## 2013-08-11 NOTE — Evaluation (Signed)
Occupational Therapy Evaluation and Discharge Patient Details Name: Debbie Mullins MRN: [id number] DOB: 08-Sep-1955 Today's Date: 08/11/2013    History of Present Illness 58 y.o. female s/p left TKA. Hx of diabetes, HTN, and COPD.   Clinical Impression   Pt presents with decreased mobility and strength from above, limiting her ability to perform her ADLs and transfers independently. Prior to admission, pt was independent. Pt was educated in and demonstrated how to use the AE available to assist her with LB bathing and dressing. Pt's current d/c plan is to receive therapy while at a SNF and therefore defer OT to SNF.  Follow Up Recommendations  SNF    Equipment Recommendations   (TBD at next venue)       Precautions / Restrictions Precautions Precautions: Knee Required Braces or Orthoses: Knee Immobilizer - Left Restrictions Weight Bearing Restrictions: Yes LLE Weight Bearing: Weight bearing as tolerated      Mobility Bed Mobility Overal bed mobility: Needs Assistance Bed Mobility: Sit to Supine       Sit to supine: Min assist   General bed mobility comments: A for L LE only. Pt was able to use handrails to pull body up on bed.  Transfers Overall transfer level: Needs assistance Equipment used: Rolling walker (2 wheeled) Transfers: Sit to/from Stand Sit to Stand: +2 physical assistance;Mod assist          General transfer comment: Mod assist +2 for boost to stand. Verbal cues for hand placement and technique. Pt rocks several times for inertia to stand.         ADL Overall ADL's : Needs assistance/impaired Eating/Feeding: Independent;Sitting   Grooming: Set up;Sitting   Upper Body Bathing: Set up;Sitting   Lower Body Bathing: Sit to/from stand;With adaptive equipment;Moderate assistance;+2 for physical assistance   Upper Body Dressing : Set up;Sitting   Lower Body Dressing: Moderate assistance;+2 for physical assistance;Sit to/from stand;With adaptive  equipment   Toilet Transfer: Moderate assistance;BSC;RW;Ambulation;+2 for physical assistance   Toileting- Clothing Manipulation and Hygiene: Maximal assistance;Sit to/from stand;+2 for physical assistance       Functional mobility during ADLs: Rolling walker;Minimal assistance General ADL Comments: Pt educated on AE available to help with LB bathing and dressing but will wait to see if she needs it at d/c from SNF. Pt able to sit>stand with Mod A +2 assist and was able to ambulate from recliner to sit  EOB bed with VC for hand placement and positioning.               Pertinent Vitals/Pain No c/o pain. Pt had just receive pain medicine prior to treatment.     Hand Dominance Right   Extremity/Trunk Assessment Upper Extremity Assessment Upper Extremity Assessment: Overall WFL for tasks assessed   Lower Extremity Assessment Lower Extremity Assessment: Defer to PT evaluation       Communication Communication Communication: No difficulties   Cognition Arousal/Alertness: Awake/alert Behavior During Therapy: WFL for tasks assessed/performed Overall Cognitive Status: Within Functional Limits for tasks assessed                                Home Living Family/patient expects to be discharged to:: Skilled nursing facility     Type of Home: Mobile home Home Access: Ramped entrance     Home Layout: One level               Home Equipment: Walker - 4 wheels;Cane -  single point          Prior Functioning/Environment Level of Independence: Independent with assistive device(s)        Comments: uses rollator and cane to ambulate             OT Goals(Current goals can be found in the care plan section) Acute Rehab OT Goals Patient Stated Goal: to get better OT Goal Formulation: With patient Time For Goal Achievement: 08/18/13 Potential to Achieve Goals: Good  OT Frequency:      End of Session Equipment Utilized During Treatment: Gait  belt;Rolling walker;Left knee immobilizer  Activity Tolerance: Patient tolerated treatment well Patient left: in bed;with call bell/phone within reach   Time: 2820-6015 OT Time Calculation (min): 28 min Charges:  OT General Charges $OT Visit: 1 Procedure OT Evaluation $Initial OT Evaluation Tier I: 1 Procedure OT Treatments $Self Care/Home Management : 8-22 mins G-Codes:    Debbie Mullins 09/07/13, 3:15 PM

## 2013-08-11 NOTE — Clinical Social Work Psychosocial (Signed)
Clinical Social Work Department  BRIEF PSYCHOSOCIAL ASSESSMENT  Patient:Debbie Mullins  Account Number: [id number]   Admit date:  08/10/2013 Clinical Social Worker Rhea Pink, MSW Date/Time: 08/11/2013  3:45PM Referred by: Physician Date Referred: NA Referred for   SNF Placement   Other Referral:  Interview type: Patient  Other interview type: PSYCHOSOCIAL DATA  Living Status: Patient states she lives with cousin who has had a stroke and cant assist her  Admitted from facility:  Level of care:  Primary support name: Herbin,Debra  Primary support relationship to patient: Cousin Degree of support available:  Strong and vested  CURRENT CONCERNS  Current Concerns   Post-Acute Placement   Other Concerns:  SOCIAL WORK ASSESSMENT / PLAN  CSW met with pt at bedside to offer support discuss SNF placement. Patient states she lives with cousin who has had a stroke and cant assist her. Patient reports that she lives in [place] and would like a bed at [facility]. CSW encouraged patient to pick a back up facility in case [facility] is unable to provide a bed.  re: PT recommendation for SNF.   Pt lives with her cousin  CSW explained placement process and answered questions.   Pt reports [facility] as her preference    CSW completed FL2 and initiated SNF search.     Assessment/plan status: Information/Referral to [employer]  Other assessment/ plan:  Information/referral to community resources:  SNF   PTAR  PATIENT'S/FAMILY'S RESPONSE TO PLAN OF CARE:  Pt  reports she is agreeable to [facility] SNF in order to increase strength and independence with mobility prior to returning home  Pt verbalized understanding of placement process and appreciation for CSW assist.   Rhea Pink, MSW, [place]

## 2013-08-11 NOTE — Evaluation (Signed)
Agree with note. 08/11/2013 Senai Kingsley, OTR/L Pager: [phone] 

## 2013-08-12 LAB — CBC
HCT: 26.4 % — ABNORMAL LOW (ref 36.0–46.0)
Hemoglobin: 9 g/dL — ABNORMAL LOW (ref 12.0–15.0)
MCH: 30 pg (ref 26.0–34.0)
MCHC: 34.1 g/dL (ref 30.0–36.0)
MCV: 88 fL (ref 78.0–100.0)
Platelets: 278 10*3/uL (ref 150–400)
RBC: 3 MIL/uL — ABNORMAL LOW (ref 3.87–5.11)
RDW: 14.2 % (ref 11.5–15.5)
WBC: 11.4 10*3/uL — ABNORMAL HIGH (ref 4.0–10.5)

## 2013-08-12 LAB — GLUCOSE, CAPILLARY
Glucose-Capillary: 105 mg/dL — ABNORMAL HIGH (ref 70–99)
Glucose-Capillary: 108 mg/dL — ABNORMAL HIGH (ref 70–99)

## 2013-08-12 MED ORDER — DSS 100 MG PO CAPS: 100.0000 mg | ORAL_CAPSULE | Freq: Two times a day (BID) | ORAL | Status: DC

## 2013-08-12 MED ORDER — SENNOSIDES-DOCUSATE SODIUM 8.6-50 MG PO TABS: 1.0000 | ORAL_TABLET | Freq: Every evening | ORAL | Status: DC | PRN

## 2013-08-12 MED ORDER — WHITE PETROLATUM GEL
Status: AC
  Filled 2013-08-12: qty 5

## 2013-08-12 NOTE — Progress Notes (Signed)
Physical Therapy Treatment Patient Details Name: Debbie Mullins MRN: [id number] DOB: [birth year] Today's Date: 08/12/2013    History of Present Illness 58 y.o. female s/p left TKA. Hx of diabetes, HTN, and COPD.    PT Comments    Pt progressing towards PT goals. Complains of increased pain today and limited ambulatory distance to 25 feet with min guard for safety. Patient will continue to benefit from skilled physical therapy services at SNF to further improve independence with functional mobility.   Follow Up Recommendations  SNF;Supervision for mobility/OOB     Equipment Recommendations  Rolling walker with 5" wheels;3in1 (PT)    Recommendations for Other Services       Precautions / Restrictions Precautions Precautions: Knee Precaution Comments: Reviewed precautions Required Braces or Orthoses: Knee Immobilizer - Left Restrictions Weight Bearing Restrictions: Yes LLE Weight Bearing: Weight bearing as tolerated    Mobility  Bed Mobility Overal bed mobility: Needs Assistance Bed Mobility: Supine to Sit     Supine to sit: Supervision Sit to supine: Min assist   General bed mobility comments: supervision for safety with verbal cues for technique. requires extra time but no physical assist needed. Pt able to perfrom sit>supine and demonstrates good control of LLE into bed.  Transfers Overall transfer level: Needs assistance Equipment used: Rolling walker (2 wheeled) Transfers: Sit to/from Stand Sit to Stand: Min assist         General transfer comment: Min assist for boost to stand from lowest bed setting. Verbal cues for hand placement. Relies heavily on RW. Cues for upright posture once standing.   Ambulation/Gait Ambulation/Gait assistance: Min guard Ambulation Distance (Feet): 25 Feet Assistive device: Rolling walker (2 wheeled) Gait Pattern/deviations: Step-to pattern;Decreased step length - right;Decreased stance time - left;Antalgic;Trunk flexed Gait  velocity: decreased   General Gait Details: Cues for walker placement within base of support and for upright posture. Additional verbal cues for left knee extension and quad activation in left stance phase. Pt complains of increased knee pain today on L while ambulating. Pre-gait standing balance focusing on increased WB through LLE with no instances of buckling.   Stairs            Wheelchair Mobility    Modified Rankin (Stroke Patients Only)       Balance                                    Cognition Arousal/Alertness: Awake/alert Behavior During Therapy: WFL for tasks assessed/performed Overall Cognitive Status: Within Functional Limits for tasks assessed                      Exercises Total Joint Exercises Ankle Circles/Pumps: AROM;Both;10 reps;Supine Quad Sets: AROM;Left;10 reps;Supine Short Arc Quad: AAROM;Left;10 reps;Supine Hip ABduction/ADduction: AAROM;Left;10 reps;Supine Knee Flexion: AROM;Left;10 reps;Seated Goniometric ROM: 8-76 degrees left knee flexion    General Comments General comments (skin integrity, edema, etc.): Reviewed precautions and therapeutic exercises.      Pertinent Vitals/Pain Pt reports her pain as "pretty bad" no numerical value given States nursing has provided pain medication recently but it is not helping very much Patient repositioned in bed for comfort.     Home Living                      Prior Function            PT Goals (current goals can  now be found in the care plan section) Acute Rehab PT Goals Patient Stated Goal: go home PT Goal Formulation: With patient Time For Goal Achievement: 08/11/13 Potential to Achieve Goals: Good Progress towards PT goals: Progressing toward goals    Frequency  7X/week    PT Plan Current plan remains appropriate    Co-evaluation             End of Session Equipment Utilized During Treatment: Gait belt;Left knee immobilizer Activity  Tolerance: Patient tolerated treatment well Patient left: in chair;with call bell/phone within reach     Time: 1207-1231 PT Time Calculation (min): 24 min  Charges:  $Gait Training: 8-22 mins $Therapeutic Exercise: 8-22 mins                    G Codes:      Debbie Mullins, Debbie Mullins 08/12/2013, 2:57 PM

## 2013-08-12 NOTE — Progress Notes (Signed)
Subjective: 2 Days Post-Op Procedure(s) (LRB):  TOTAL KNEE ARTHROPLASTY (Left) Patient reports pain as moderate.    Objective: Vital signs in last 24 hours: Temp:  [98 F (36.7 C)-98.8 F (37.1 C)] 98.8 F (37.1 C) (06/03 0507) Pulse Rate:  [93-109] 103 (06/03 0507) Resp:  [18] 18 (06/03 0507) BP: (109-133)/(47-63) 131/63 mmHg (06/03 0507) SpO2:  [92 %-95 %] 92 % (06/03 0507)  Intake/Output from previous day: 06/02 0701 - 06/03 0700 In: 1080 [P.O.:1080] Out: 50 [Urine:50] Intake/Output this shift:     Recent Labs  08/11/13 0624 08/12/13 0411  HGB 9.3* 9.0*    Recent Labs  08/11/13 0624 08/12/13 0411  WBC 9.0 11.4*  RBC 3.19* 3.00*  HCT 28.5* 26.4*  PLT 290 278    Recent Labs  08/11/13 0624  NA 137  K 4.8  CL 102  CO2 26  BUN 24*  CREATININE 1.01  GLUCOSE 107*  CALCIUM 7.9*   No results found for this basename: LABPT, INR,  in the last 72 hours  Neurologically intact  Assessment/Plan: 2 Days Post-Op Procedure(s) (LRB):  TOTAL KNEE ARTHROPLASTY (Left) Up with therapy   FL 2 signed.   Debbie Mullins 08/12/2013, 7:41 AM

## 2013-08-12 NOTE — Discharge Summary (Signed)
Physician Discharge Summary  Patient ID: Debbie Mullins MRN: [id number] DOB/AGE: 1955-10-13 58 y.o.  Admit date: 08/10/2013 Discharge date: 08/12/2013  Admission Diagnoses:  Failed total left knee replacement  Discharge Diagnoses:  Principal Problem:   Failed total left knee replacement   Past Medical History  Diagnosis Date  . Anemia   . Leukocytosis 02/09/2011  . Normocytic anemia 02/09/2011  . Thrombocytosis 02/09/2011  . COPD (chronic obstructive pulmonary disease)     Albuterol inhaler prn;SIngulair at night  . Depression     takes Wellbutrin daily  . GERD (gastroesophageal reflux disease)     takes Nexium daily  . Hypertension     takes Lisinopril daily  . Diabetes mellitus     takes Metformin daily  . Asthma   . Pneumonia     many yrs ago  . History of colon polyps   . Urinary frequency   . Urinary urgency   . Peripheral edema     takes Furosemide daily  . CHF (congestive heart failure)     takes Furosemide daily   . Pulmonary nodules/lesions, multiple 08/09/2013    Surgeries: Procedure(s): LEFT TOTAL KNEE ARTHROPLASTY REVISION on 08/10/2013   Consultants (if any):  NONE  Discharged Condition: Improved  Hospital Course: Debbie Mullins is an 58 y.o. female who was admitted 08/10/2013 with a diagnosis of Failed total left knee replacement and went to the operating room on 08/10/2013 and underwent the above named procedures.    She was given perioperative antibiotics:      Anti-infectives   Start     Dose/Rate Route Frequency Ordered Stop   08/10/13 0600  ceFAZolin (ANCEF) IVPB 2 g/50 mL premix     2 g 100 mL/hr over 30 Minutes Intravenous On call to O.R. 08/09/13 1432 08/10/13 1309    .  She was given sequential compression devices, early ambulation, and aspirin for DVT prophylaxis.  She benefited maximally from the hospital stay and there were no complications.    Recent vital signs:  Filed Vitals:   08/12/13 0507  BP: 131/63  Pulse: 103   Temp: 98.8 F (37.1 C)  Resp: 18    Recent laboratory studies:  Lab Results  Component Value Date   HGB 9.0* 08/12/2013   HGB 9.3* 08/11/2013   HGB 12.2 08/05/2013   Lab Results  Component Value Date   WBC 11.4* 08/12/2013   PLT 278 08/12/2013   Lab Results  Component Value Date   INR 0.99 08/05/2013   Lab Results  Component Value Date   NA 137 08/11/2013   K 4.8 08/11/2013   CL 102 08/11/2013   CO2 26 08/11/2013   BUN 24* 08/11/2013   CREATININE 1.01 08/11/2013   GLUCOSE 107* 08/11/2013    Discharge Medications:     Medication List         acetaminophen 500 MG tablet  Commonly known as:  TYLENOL  Take 500 mg by mouth every 6 (six) hours as needed for mild pain.     albuterol 108 (90 BASE) MCG/ACT inhaler  Commonly known as:  PROVENTIL HFA;VENTOLIN HFA  Inhale 2 puffs into the lungs every 6 (six) hours as needed. For shortness of breath or wheezing     aspirin 81 MG tablet  Take 81 mg by mouth daily.     aspirin EC 325 MG tablet  Take 1 tablet (325 mg total) by mouth daily.     buPROPion 150 MG 24 hr tablet  Commonly known  as:  WELLBUTRIN XL  Take 150 mg by mouth daily.     celecoxib 200 MG capsule  Commonly known as:  CELEBREX  Take 200 mg by mouth daily.     DSS 100 MG Caps  Take 100 mg by mouth 2 (two) times daily.     esomeprazole 40 MG capsule  Commonly known as:  NEXIUM  Take 40 mg by mouth daily before breakfast.     furosemide 20 MG tablet  Commonly known as:  LASIX  Take 1 tablet (20 mg total) by mouth every other day.     HYDROcodone-acetaminophen 10-325 MG per tablet  Commonly known as:  NORCO  Take 1 tablet by mouth every 6 (six) hours as needed for moderate pain.     lisinopril 10 MG tablet  Commonly known as:  PRINIVIL,ZESTRIL  Take 5 mg by mouth daily.     metFORMIN 500 MG tablet  Commonly known as:  GLUCOPHAGE  Take 500 mg by mouth 3 (three) times daily.     methocarbamol 500 MG tablet  Commonly known as:  ROBAXIN  Take 1 tablet (500 mg  total) by mouth every 6 (six) hours as needed for muscle spasms (spasm).     montelukast 10 MG tablet  Commonly known as:  SINGULAIR  Take 10 mg by mouth at bedtime.     nicotine 21 mg/24hr patch  Commonly known as:  NICODERM CQ - dosed in mg/24 hours  Place 1 patch onto the skin daily. Uses occasionally     oxyCODONE-acetaminophen 5-325 MG per tablet  Commonly known as:  ROXICET  Take 1-2 tablets by mouth every 4 (four) hours as needed.     senna-docusate 8.6-50 MG per tablet  Commonly known as:  Senokot-S  Take 1 tablet by mouth at bedtime as needed for mild constipation.        Diagnostic Studies: Dg Chest 2 View  08/05/2013   CLINICAL DATA:  Preop.  History of hypertension, COPD.  EXAM: CHEST  2 VIEW  COMPARISON:  01/04/2012  FINDINGS: Low lung volumes with bibasilar atelectasis. Heart is normal size. No effusions. No acute bony abnormality.  IMPRESSION: Low lung volumes, bibasilar atelectasis.   Electronically Signed   By: Rolm Baptise M.D.   On: 08/05/2013 12:43   Ct Knee Left Wo Contrast  07/14/2013   CLINICAL DATA:  Status post left knee replacement and 2007. Status post fall 1 month ago. Evaluate positioning of the knee arthroplasty. Question fracture.  EXAM: CT OF THE LEFT KNEE KNEE WITHOUT CONTRAST  TECHNIQUE: Multidetector CT imaging of the knee knee was performed according to the standard protocol. Multiplanar CT image reconstructions were also generated.  COMPARISON:  Plain films left knee 06/24/2013.  FINDINGS: Streak artifact from the patient's knee replacement somewhat limits the exam. No fracture is identified. Marked valgus angulation of the left knee seen on the comparison plain films is not present on this examination. There is anterior subluxation of the tibia relative to the femur which is markedly improved compared to the prior exam. No evidence of hardware loosening is identified. There is only a small joint effusion. A loose body seen off the superior pole the  patella measures up to 1 cm in diameter. Osteophytes are identified about the knee. Bones appear osteopenic.  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  No  evidence of loosening of the patient's left knee arthroplasty.  Negative for fracture.  Osteopenia.   Electronically Signed   By: Inge Rise M.D.   On: 07/14/2013 12:43   Dg Knee Left Port  08/10/2013   CLINICAL DATA:  58 year old female status post knee revision. Initial encounter.  EXAM: PORTABLE LEFT KNEE - 1-2 VIEW  COMPARISON:  06/24/2013.  FINDINGS: Portable views at 1513 hrs. Revised left total knee arthroplasty hardware. Components appear intact and normally aligned. Postoperative changes to the surrounding soft tissues. No unexpected osseous changes identified. Numerous phleboliths again noted.  IMPRESSION: Left total knee arthroplasty revision, no adverse features identified.   Electronically Signed   By: Lars Pinks M.D.   On: 08/10/2013 15:45    Disposition: 01-Home or Self Care  Discharge Instructions   CPM    Complete by:  As directed   Continuous passive motion machine (CPM):      Use the CPM from 45 to 90 for 4-6 hours per day.      You may increase by 10-15 per day.  You may break it up into 2 or 3 sessions per day.      Use CPM for 2 weeks or until you are told to stop.     Call MD / Call 911    Complete by:  As directed   If you experience chest pain or shortness of breath, CALL 911 and be transported to the hospital emergency room.  If you develope a fever above 101 F, pus (white drainage) or increased drainage or redness at the wound, or calf pain, call your surgeon's office.     Constipation Prevention    Complete by:  As directed   Drink plenty of fluids.  Prune juice may be helpful.  You may use a stool softener, such as Colace (over the counter) 100 mg  twice a day.  Use MiraLax (over the counter) for constipation as needed.     Diet - low sodium heart healthy    Complete by:  As directed   MODIFIED CARBOHYDRATE DIET     Discharge instructions    Complete by:  As directed   Keep knee incision dry for 5 days post op then may wet while bathing. Therapy daily and CPM goal full extension and greater than 90 degrees flexion. Call if fever or chills or increased drainage. Go to ER if acutely short of breath or call for ambulance. Return for follow up in 2 weeks. May full weight bear on the surgical leg unless told otherwise. Use knee immobilizer until able to straight leg raise off bed with knee stable. In house walking for first 2 weeks.     Do not put a pillow under the knee. Place it under the heel.    Complete by:  As directed      Full weight bearing    Complete by:  As directed   Laterality:  left  Extremity:  Lower     Increase activity slowly as tolerated    Complete by:  As directed            Follow-up Information   Follow up with [name], MD. Schedule an appointment as soon as possible for a visit in 2 weeks.   Specialty:  Orthopedic Surgery   Contact information:   [place] [address] [phone]        Signed: Epimenio Foot 08/12/2013, 11:49 AM

## 2013-08-13 NOTE — Progress Notes (Signed)
Clinical social worker assisted with patient discharge to skilled nursing facility, [facility].  CSW addressed all family questions and concerns. CSW copied chart and added all important documents. CSW also set up patient transportation with [facility]. Clinical Social Worker will sign off for now as social work intervention is no longer needed.   Yovan Leeman, MSW, LCSWA [phone] 

## 2013-08-14 ENCOUNTER — Encounter (HOSPITAL_COMMUNITY): Payer: Self-pay | Admitting: Orthopaedic Surgery

## 2014-02-18 ENCOUNTER — Encounter (HOSPITAL_COMMUNITY): Payer: Self-pay | Admitting: Cardiovascular Disease

## 2014-05-19 DIAGNOSIS — J441 Chronic obstructive pulmonary disease with (acute) exacerbation: Secondary | ICD-10-CM | POA: Diagnosis not present

## 2014-05-19 DIAGNOSIS — E119 Type 2 diabetes mellitus without complications: Secondary | ICD-10-CM | POA: Diagnosis not present

## 2014-06-02 DIAGNOSIS — Z0000 Encounter for general adult medical examination without abnormal findings: Secondary | ICD-10-CM | POA: Diagnosis not present

## 2014-06-02 DIAGNOSIS — J441 Chronic obstructive pulmonary disease with (acute) exacerbation: Secondary | ICD-10-CM | POA: Diagnosis not present

## 2014-06-02 DIAGNOSIS — E119 Type 2 diabetes mellitus without complications: Secondary | ICD-10-CM | POA: Diagnosis not present

## 2014-06-14 DIAGNOSIS — M545 Low back pain: Secondary | ICD-10-CM | POA: Diagnosis not present

## 2014-06-30 DIAGNOSIS — L02224 Furuncle of groin: Secondary | ICD-10-CM | POA: Diagnosis not present

## 2014-07-14 DIAGNOSIS — I10 Essential (primary) hypertension: Secondary | ICD-10-CM | POA: Diagnosis not present

## 2014-07-14 DIAGNOSIS — E119 Type 2 diabetes mellitus without complications: Secondary | ICD-10-CM | POA: Diagnosis not present

## 2014-07-14 DIAGNOSIS — L02224 Furuncle of groin: Secondary | ICD-10-CM | POA: Diagnosis not present

## 2014-07-14 DIAGNOSIS — E785 Hyperlipidemia, unspecified: Secondary | ICD-10-CM | POA: Diagnosis not present

## 2014-09-24 DIAGNOSIS — M5412 Radiculopathy, cervical region: Secondary | ICD-10-CM | POA: Diagnosis not present

## 2014-09-27 DIAGNOSIS — M19012 Primary osteoarthritis, left shoulder: Secondary | ICD-10-CM | POA: Diagnosis not present

## 2014-09-27 DIAGNOSIS — M419 Scoliosis, unspecified: Secondary | ICD-10-CM | POA: Diagnosis not present

## 2014-09-27 DIAGNOSIS — M5011 Cervical disc disorder with radiculopathy,  high cervical region: Secondary | ICD-10-CM | POA: Diagnosis not present

## 2014-09-27 DIAGNOSIS — M9901 Segmental and somatic dysfunction of cervical region: Secondary | ICD-10-CM | POA: Diagnosis not present

## 2014-09-27 DIAGNOSIS — M4721 Other spondylosis with radiculopathy, occipito-atlanto-axial region: Secondary | ICD-10-CM | POA: Diagnosis not present

## 2014-09-27 DIAGNOSIS — M4722 Other spondylosis with radiculopathy, cervical region: Secondary | ICD-10-CM | POA: Diagnosis not present

## 2014-10-01 DIAGNOSIS — M4712 Other spondylosis with myelopathy, cervical region: Secondary | ICD-10-CM | POA: Diagnosis not present

## 2014-10-11 DIAGNOSIS — M9911 Subluxation complex (vertebral) of cervical region: Secondary | ICD-10-CM | POA: Diagnosis not present

## 2014-10-11 DIAGNOSIS — M75122 Complete rotator cuff tear or rupture of left shoulder, not specified as traumatic: Secondary | ICD-10-CM | POA: Diagnosis not present

## 2014-10-11 DIAGNOSIS — M5021 Other cervical disc displacement,  high cervical region: Secondary | ICD-10-CM | POA: Diagnosis not present

## 2014-10-11 DIAGNOSIS — M2548 Effusion, other site: Secondary | ICD-10-CM | POA: Diagnosis not present

## 2014-10-11 DIAGNOSIS — M47812 Spondylosis without myelopathy or radiculopathy, cervical region: Secondary | ICD-10-CM | POA: Diagnosis not present

## 2014-10-11 DIAGNOSIS — M47811 Spondylosis without myelopathy or radiculopathy, occipito-atlanto-axial region: Secondary | ICD-10-CM | POA: Diagnosis not present

## 2014-10-18 DIAGNOSIS — M4712 Other spondylosis with myelopathy, cervical region: Secondary | ICD-10-CM | POA: Diagnosis not present

## 2014-10-29 DIAGNOSIS — M179 Osteoarthritis of knee, unspecified: Secondary | ICD-10-CM | POA: Diagnosis not present

## 2014-11-03 DIAGNOSIS — E785 Hyperlipidemia, unspecified: Secondary | ICD-10-CM | POA: Diagnosis not present

## 2014-11-03 DIAGNOSIS — M4712 Other spondylosis with myelopathy, cervical region: Secondary | ICD-10-CM | POA: Diagnosis not present

## 2014-11-03 DIAGNOSIS — W0110XA Fall on same level from slipping, tripping and stumbling with subsequent striking against unspecified object, initial encounter: Secondary | ICD-10-CM | POA: Diagnosis not present

## 2014-11-03 DIAGNOSIS — I10 Essential (primary) hypertension: Secondary | ICD-10-CM | POA: Diagnosis not present

## 2014-11-03 DIAGNOSIS — S142XXA Injury of nerve root of cervical spine, initial encounter: Secondary | ICD-10-CM | POA: Diagnosis not present

## 2014-11-03 DIAGNOSIS — E119 Type 2 diabetes mellitus without complications: Secondary | ICD-10-CM | POA: Diagnosis not present

## 2014-11-03 DIAGNOSIS — E1169 Type 2 diabetes mellitus with other specified complication: Secondary | ICD-10-CM | POA: Diagnosis not present

## 2014-11-04 DIAGNOSIS — I10 Essential (primary) hypertension: Secondary | ICD-10-CM | POA: Diagnosis not present

## 2014-11-04 DIAGNOSIS — M5000 Cervical disc disorder with myelopathy, unspecified cervical region: Secondary | ICD-10-CM | POA: Diagnosis not present

## 2014-11-05 ENCOUNTER — Other Ambulatory Visit: Payer: Self-pay | Admitting: Neurosurgery

## 2014-11-11 NOTE — Pre-Procedure Instructions (Signed)
Debbie Mullins  11/11/2014      [id number] - [place], [place] - [address] [place] [address] Phone: [phone] Fax: [phone]  [facility] - [place], [place] [address] [place] [address] Phone: [phone] Fax: [phone]    Your procedure is scheduled on Wed, Sept 7 @ 12:15 PM  Report to [facility] [place] at 9:15 AM  Call this number if you have problems the morning of surgery:  [phone]   Remember:  Do not eat food or drink liquids after midnight.  Take these medicines the morning of surgery with A SIP OF WATER Albuterol<Bring Your Inhaler With You>,Wellbutrin(Bupropion),Nexium(Esomeprazole),and Pain Pill(if needed)               Stop taking your Lodine and Aspirin. No Goody's,BC's,Aleve,Ibuprofen,Fish Oil,or any Herbal Medications.                  How to Manage Your Diabetes Before Surgery   Why is it important to control my blood sugar before and after surgery?   Improving blood sugar levels before and after surgery helps healing and can limit problems.  A way of improving blood sugar control is eating a healthy diet by:  - Eating less sugar and carbohydrates  - Increasing activity/exercise  - Talk with your doctor about reaching your blood sugar goals  High blood sugars (greater than 180 mg/dL) can raise your risk of infections and slow down your recovery so you will need to focus on controlling your diabetes during the weeks before surgery.  Make sure that the doctor who takes care of your diabetes knows about your planned surgery including the date and location.  How do I manage my blood sugars before surgery?   Check your blood sugar at least 4 times a day, 2 days before surgery to make sure that they are not too high or low.   Check your blood sugar the morning of your surgery when you wake up and every 2                hours until you get to the Short-Stay unit.  If your blood sugar is less than 70 mg/dL, you will need to treat for low blood sugar by:  Treat a low blood sugar (less than 70 mg/dL) with 1/2 cup of clear juice (cranberry or apple), 4 glucose tablets, OR glucose gel.  Recheck blood sugar in 15 minutes after treatment (to make sure it is greater than 70 mg/dL).  If blood sugar is not greater than 70 mg/dL on re-check, call [phone] for further instructions.   Report your blood sugar to the Short-Stay nurse when you get to Short-Stay.  References:  [facility], 2007 "How to Manage your Diabetes Before and After Surgery".  What do I do about my diabetes medications?   Do not take oral diabetes medicines (pills) the morning of surgery.  THE NIGHT BEFORE SURGERY DO NOT TAKE YOUR METFORMIN.    THE MORNING OF SURGERY, DO NOT TAKE YOUR METFORMIN    Do not take other diabetes injectables the day of surgery including Byetta, Victoza, Bydureon, and Trulicity.    If your CBG is greater than 220 mg/dL, you may take 1/2 of your sliding scale (correction) dose of insulin.   For patients with "Insulin Pumps":  Contact your diabetes doctor for specific instructions before surgery.   Decrease basal insulin rates by 20% at midnight the night before surgery.  Note that if your surgery is planned to be longer than 2 hours, your insulin pump will be removed and intravenous (IV) insulin will be started and managed by the nurses and anesthesiologist.  You will be able to restart your insulin pump once you are awake and able to manage it.  Make sure to bring insulin pump supplies to the hospital with you in case your site needs to be changed.         Do not wear jewelry, make-up or nail polish.  Do not wear lotions, powders, or perfumes.  You may wear deodorant.  Do not shave 48 hours prior to surgery.    Do not bring valuables to the  hospital.  [facility] is not responsible for any belongings or valuables.  Contacts, dentures or bridgework may not be worn into surgery.  Leave your suitcase in the car.  After surgery it may be brought to your room.  For patients admitted to the hospital, discharge time will be determined by your treatment team.  Patients discharged the day of surgery will not be allowed to drive home.    Special instructions:  [place] - Preparing for Surgery  Before surgery, you can play an important role.  Because skin is not sterile, your skin needs to be as free of germs as possible.  You can reduce the number of germs on you skin by washing with CHG (chlorahexidine gluconate) soap before surgery.  CHG is an antiseptic cleaner which kills germs and bonds with the skin to continue killing germs even after washing.  Please DO NOT use if you have an allergy to CHG or antibacterial soaps.  If your skin becomes reddened/irritated stop using the CHG and inform your nurse when you arrive at Short Stay.  Do not shave (including legs and underarms) for at least 48 hours prior to the first CHG shower.  You may shave your face.  Please follow these instructions carefully:   1.  Shower with CHG Soap the night before surgery and the                                morning of Surgery.  2.  If you choose to wash your hair, wash your hair first as usual with your       normal shampoo.  3.  After you shampoo, rinse your hair and body thoroughly to remove the                      Shampoo.  4.  Use CHG as you would any other liquid soap.  You can apply chg directly       to the skin and wash gently with scrungie or a clean washcloth.  5.  Apply the CHG Soap to your body ONLY FROM THE NECK DOWN.        Do not use on open wounds or open sores.  Avoid contact with your eyes,       ears, mouth and genitals (private parts).  Wash genitals (private parts)       with your normal soap.  6.  Wash thoroughly, paying special  attention to the area where your surgery        will be performed.  7.  Thoroughly  rinse your body with warm water from the neck down.  8.  DO NOT shower/wash with your normal soap after using and rinsing off       the CHG Soap.  9.  Pat yourself dry with a clean towel.            10.  Wear clean pajamas.            11.  Place clean sheets on your bed the night of your first shower and do not        sleep with pets.  Day of Surgery  Do not apply any lotions/deoderants the morning of surgery.  Please wear clean clothes to the hospital/surgery center.    Please read over the following fact sheets that you were given. Pain Booklet, Coughing and Deep Breathing, MRSA Information and Surgical Site Infection Prevention

## 2014-11-12 ENCOUNTER — Encounter (HOSPITAL_COMMUNITY): Payer: Self-pay

## 2014-11-12 ENCOUNTER — Other Ambulatory Visit: Payer: Self-pay

## 2014-11-12 ENCOUNTER — Encounter (HOSPITAL_COMMUNITY)
Admission: RE | Admit: 2014-11-12 | Discharge: 2014-11-12 | Disposition: A | Discharge status: Home / Self Care | Payer: Medicare Other | Source: Ambulatory Visit | Attending: Neurosurgery | Admitting: Neurosurgery

## 2014-11-12 DIAGNOSIS — Z01812 Encounter for preprocedural laboratory examination: Secondary | ICD-10-CM | POA: Diagnosis not present

## 2014-11-12 DIAGNOSIS — Z01810 Encounter for preprocedural cardiovascular examination: Secondary | ICD-10-CM | POA: Insufficient documentation

## 2014-11-12 DIAGNOSIS — M5020 Other cervical disc displacement, unspecified cervical region: Secondary | ICD-10-CM | POA: Insufficient documentation

## 2014-11-12 DIAGNOSIS — M5030 Other cervical disc degeneration, unspecified cervical region: Secondary | ICD-10-CM | POA: Diagnosis not present

## 2014-11-12 HISTORY — DX: Reserved for inherently not codable concepts without codable children: IMO0001

## 2014-11-12 HISTORY — DX: Pain in unspecified joint: M25.50

## 2014-11-12 HISTORY — DX: Weakness: R53.1

## 2014-11-12 HISTORY — DX: Cervicalgia: M54.2

## 2014-11-12 HISTORY — DX: Effusion, unspecified joint: M25.40

## 2014-11-12 LAB — BASIC METABOLIC PANEL
Anion gap: 12 (ref 5–15)
BUN: 18 mg/dL (ref 6–20)
CO2: 22 mmol/L (ref 22–32)
Calcium: 8.6 mg/dL — ABNORMAL LOW (ref 8.9–10.3)
Chloride: 102 mmol/L (ref 101–111)
Creatinine, Ser: 1.05 mg/dL — ABNORMAL HIGH (ref 0.44–1.00)
GFR calc Af Amer: >60 mL/min (ref >60)
GFR calc non Af Amer: 58 mL/min — ABNORMAL LOW (ref >60)
Glucose, Bld: 176 mg/dL — ABNORMAL HIGH (ref 65–99)
Potassium: 4.5 mmol/L (ref 3.5–5.1)
Sodium: 136 mmol/L (ref 135–145)

## 2014-11-12 LAB — CBC
HCT: 35.4 % — ABNORMAL LOW (ref 36.0–46.0)
Hemoglobin: 11.5 g/dL — ABNORMAL LOW (ref 12.0–15.0)
MCH: 30.5 pg (ref 26.0–34.0)
MCHC: 32.5 g/dL (ref 30.0–36.0)
MCV: 93.9 fL (ref 78.0–100.0)
Platelets: 322 10*3/uL (ref 150–400)
RBC: 3.77 MIL/uL — ABNORMAL LOW (ref 3.87–5.11)
RDW: 15.3 % (ref 11.5–15.5)
WBC: 12.9 10*3/uL — ABNORMAL HIGH (ref 4.0–10.5)

## 2014-11-12 LAB — SURGICAL PCR SCREEN
MRSA, PCR: NEGATIVE
Staphylococcus aureus: NEGATIVE

## 2014-11-12 LAB — GLUCOSE, CAPILLARY: Glucose-Capillary: 197 mg/dL — ABNORMAL HIGH (ref 65–99)

## 2014-11-12 NOTE — Progress Notes (Addendum)
Cardiologist is Dr.Ross-last visit about 2 yrs ago-follows up prn  Medical MD is Dr.Veita Criss Rosales  Echo in epic from 2013  Stress test denies ever having one  Heart cath in epic from 2013  EKG denies having one in past yr  CXR denies having one in past yr

## 2014-11-13 LAB — HEMOGLOBIN A1C
Hgb A1c MFr Bld: 7.1 % — ABNORMAL HIGH (ref 4.8–5.6)
Mean Plasma Glucose: 157 mg/dL

## 2014-11-17 ENCOUNTER — Ambulatory Visit (HOSPITAL_COMMUNITY)
Admission: RE | Admit: 2014-11-17 | Discharge: 2014-11-18 | Disposition: A | Discharge status: Home Health | Payer: Medicare Other | Source: Ambulatory Visit | Attending: Neurosurgery | Admitting: Neurosurgery

## 2014-11-17 ENCOUNTER — Inpatient Hospital Stay (HOSPITAL_COMMUNITY): Payer: Medicare Other | Admitting: Certified Registered"

## 2014-11-17 ENCOUNTER — Inpatient Hospital Stay (HOSPITAL_COMMUNITY): Payer: Medicare Other

## 2014-11-17 ENCOUNTER — Encounter (HOSPITAL_COMMUNITY)
Admission: RE | Disposition: A | Discharge status: Home / Self Care | Payer: Self-pay | Source: Ambulatory Visit | Attending: Neurosurgery

## 2014-11-17 ENCOUNTER — Encounter (HOSPITAL_COMMUNITY): Payer: Self-pay | Admitting: General Practice

## 2014-11-17 DIAGNOSIS — K219 Gastro-esophageal reflux disease without esophagitis: Secondary | ICD-10-CM | POA: Diagnosis not present

## 2014-11-17 DIAGNOSIS — M5022 Other cervical disc displacement, mid-cervical region: Principal | ICD-10-CM | POA: Insufficient documentation

## 2014-11-17 DIAGNOSIS — I10 Essential (primary) hypertension: Secondary | ICD-10-CM | POA: Insufficient documentation

## 2014-11-17 DIAGNOSIS — J449 Chronic obstructive pulmonary disease, unspecified: Secondary | ICD-10-CM | POA: Diagnosis not present

## 2014-11-17 DIAGNOSIS — M5002 Cervical disc disorder with myelopathy, mid-cervical region: Secondary | ICD-10-CM | POA: Diagnosis not present

## 2014-11-17 DIAGNOSIS — M4712 Other spondylosis with myelopathy, cervical region: Secondary | ICD-10-CM | POA: Diagnosis present

## 2014-11-17 DIAGNOSIS — D649 Anemia, unspecified: Secondary | ICD-10-CM | POA: Diagnosis not present

## 2014-11-17 DIAGNOSIS — M5020 Other cervical disc displacement, unspecified cervical region: Secondary | ICD-10-CM | POA: Diagnosis not present

## 2014-11-17 DIAGNOSIS — E119 Type 2 diabetes mellitus without complications: Secondary | ICD-10-CM | POA: Diagnosis not present

## 2014-11-17 DIAGNOSIS — F17210 Nicotine dependence, cigarettes, uncomplicated: Secondary | ICD-10-CM | POA: Diagnosis not present

## 2014-11-17 DIAGNOSIS — Z23 Encounter for immunization: Secondary | ICD-10-CM | POA: Insufficient documentation

## 2014-11-17 DIAGNOSIS — Z419 Encounter for procedure for purposes other than remedying health state, unspecified: Secondary | ICD-10-CM

## 2014-11-17 DIAGNOSIS — M5032 Other cervical disc degeneration, mid-cervical region: Secondary | ICD-10-CM | POA: Diagnosis not present

## 2014-11-17 HISTORY — PX: ANTERIOR CERVICAL DECOMP/DISCECTOMY FUSION: SHX1161

## 2014-11-17 LAB — GLUCOSE, CAPILLARY
Glucose-Capillary: 117 mg/dL — ABNORMAL HIGH (ref 65–99)
Glucose-Capillary: 155 mg/dL — ABNORMAL HIGH (ref 65–99)
Glucose-Capillary: 198 mg/dL — ABNORMAL HIGH (ref 65–99)

## 2014-11-17 SURGERY — ANTERIOR CERVICAL DECOMPRESSION/DISCECTOMY FUSION 1 LEVEL
Anesthesia: General

## 2014-11-17 MED ORDER — MIDAZOLAM HCL 2 MG/2ML IJ SOLN
INTRAMUSCULAR | Status: AC
  Filled 2014-11-17: qty 4

## 2014-11-17 MED ORDER — FENTANYL CITRATE (PF) 100 MCG/2ML IJ SOLN
INTRAMUSCULAR | Status: DC | PRN
  Administered 2014-11-17 (×4): 50 ug via INTRAVENOUS

## 2014-11-17 MED ORDER — ROCURONIUM BROMIDE 50 MG/5ML IV SOLN
INTRAVENOUS | Status: AC
  Filled 2014-11-17: qty 1

## 2014-11-17 MED ORDER — DEXAMETHASONE 4 MG PO TABS
4.0000 mg | ORAL_TABLET | Freq: Every day | ORAL | Status: DC
  Administered 2014-11-17 – 2014-11-18 (×2): 4 mg via ORAL
  Filled 2014-11-17 (×2): qty 1

## 2014-11-17 MED ORDER — MONTELUKAST SODIUM 10 MG PO TABS
10.0000 mg | ORAL_TABLET | Freq: Every day | ORAL | Status: DC
  Administered 2014-11-17: 10 mg via ORAL
  Filled 2014-11-17 (×2): qty 1

## 2014-11-17 MED ORDER — GLYCOPYRROLATE 0.2 MG/ML IJ SOLN
INTRAMUSCULAR | Status: AC
  Filled 2014-11-17: qty 2

## 2014-11-17 MED ORDER — HYDROCODONE-ACETAMINOPHEN 5-325 MG PO TABS
1.0000 | ORAL_TABLET | ORAL | Status: DC | PRN
  Administered 2014-11-17: 2 via ORAL
  Filled 2014-11-17: qty 2

## 2014-11-17 MED ORDER — ACETAMINOPHEN 325 MG PO TABS: 650.0000 mg | ORAL_TABLET | ORAL | Status: DC | PRN

## 2014-11-17 MED ORDER — NICOTINE 21 MG/24HR TD PT24
21.0000 mg | MEDICATED_PATCH | Freq: Every day | TRANSDERMAL | Status: DC
  Administered 2014-11-17 – 2014-11-18 (×2): 21 mg via TRANSDERMAL
  Filled 2014-11-17 (×2): qty 1

## 2014-11-17 MED ORDER — SODIUM CHLORIDE 0.9 % IJ SOLN
3.0000 mL | Freq: Two times a day (BID) | INTRAMUSCULAR | Status: DC
  Administered 2014-11-17: 3 mL via INTRAVENOUS

## 2014-11-17 MED ORDER — LIDOCAINE HCL (CARDIAC) 20 MG/ML IV SOLN
INTRAVENOUS | Status: DC | PRN
  Administered 2014-11-17: 80 mg via INTRAVENOUS

## 2014-11-17 MED ORDER — ONDANSETRON HCL 4 MG/2ML IJ SOLN: 4.0000 mg | INTRAMUSCULAR | Status: DC | PRN

## 2014-11-17 MED ORDER — SODIUM CHLORIDE 0.9 % IV SOLN: 250.0000 mL | INTRAVENOUS | Status: DC

## 2014-11-17 MED ORDER — ARTIFICIAL TEARS OP OINT
TOPICAL_OINTMENT | OPHTHALMIC | Status: AC
  Filled 2014-11-17: qty 3.5

## 2014-11-17 MED ORDER — PANTOPRAZOLE SODIUM 40 MG PO TBEC
80.0000 mg | DELAYED_RELEASE_TABLET | Freq: Every day | ORAL | Status: DC
  Administered 2014-11-18: 80 mg via ORAL
  Filled 2014-11-17: qty 2

## 2014-11-17 MED ORDER — INFLUENZA VAC SPLIT QUAD 0.5 ML IM SUSY
0.5000 mL | PREFILLED_SYRINGE | INTRAMUSCULAR | Status: AC
Start: 2014-11-18 — End: 2014-11-18
  Administered 2014-11-18: 0.5 mL via INTRAMUSCULAR
  Filled 2014-11-17: qty 0.5

## 2014-11-17 MED ORDER — FENTANYL CITRATE (PF) 250 MCG/5ML IJ SOLN
INTRAMUSCULAR | Status: AC
  Filled 2014-11-17: qty 5

## 2014-11-17 MED ORDER — POTASSIUM CHLORIDE IN NACL 20-0.9 MEQ/L-% IV SOLN
INTRAVENOUS | Status: DC
Start: 2014-11-17 — End: 2014-11-18
  Filled 2014-11-17 (×4): qty 1000

## 2014-11-17 MED ORDER — ACETAMINOPHEN 650 MG RE SUPP
650.0000 mg | RECTAL | Status: DC | PRN
Start: 2014-11-17 — End: 2014-11-18

## 2014-11-17 MED ORDER — SODIUM CHLORIDE 0.9 % IJ SOLN: 3.0000 mL | INTRAMUSCULAR | Status: DC | PRN

## 2014-11-17 MED ORDER — PHENYLEPHRINE HCL 10 MG/ML IJ SOLN
INTRAMUSCULAR | Status: DC | PRN
  Administered 2014-11-17 (×7): 80 ug via INTRAVENOUS

## 2014-11-17 MED ORDER — ASPIRIN EC 81 MG PO TBEC
81.0000 mg | DELAYED_RELEASE_TABLET | Freq: Every day | ORAL | Status: DC
  Administered 2014-11-17 – 2014-11-18 (×2): 81 mg via ORAL
  Filled 2014-11-17 (×2): qty 1

## 2014-11-17 MED ORDER — LIDOCAINE HCL (CARDIAC) 20 MG/ML IV SOLN
INTRAVENOUS | Status: AC
  Filled 2014-11-17: qty 5

## 2014-11-17 MED ORDER — DEXAMETHASONE SODIUM PHOSPHATE 10 MG/ML IJ SOLN
INTRAMUSCULAR | Status: AC
  Filled 2014-11-17: qty 1

## 2014-11-17 MED ORDER — SENNOSIDES-DOCUSATE SODIUM 8.6-50 MG PO TABS: 1.0000 | ORAL_TABLET | Freq: Every evening | ORAL | Status: DC | PRN

## 2014-11-17 MED ORDER — PROPOFOL 10 MG/ML IV BOLUS
INTRAVENOUS | Status: DC | PRN
  Administered 2014-11-17: 160 mg via INTRAVENOUS

## 2014-11-17 MED ORDER — GLYCOPYRROLATE 0.2 MG/ML IJ SOLN
INTRAMUSCULAR | Status: DC | PRN
  Administered 2014-11-17: 0.4 mg via INTRAVENOUS

## 2014-11-17 MED ORDER — PHENYLEPHRINE 40 MCG/ML (10ML) SYRINGE FOR IV PUSH (FOR BLOOD PRESSURE SUPPORT)
PREFILLED_SYRINGE | INTRAVENOUS | Status: AC
  Filled 2014-11-17: qty 10

## 2014-11-17 MED ORDER — 0.9 % SODIUM CHLORIDE (POUR BTL) OPTIME
TOPICAL | Status: DC | PRN
  Administered 2014-11-17: 1000 mL

## 2014-11-17 MED ORDER — DEXAMETHASONE SODIUM PHOSPHATE 10 MG/ML IJ SOLN
INTRAMUSCULAR | Status: DC | PRN
  Administered 2014-11-17: 10 mg via INTRAVENOUS

## 2014-11-17 MED ORDER — LISINOPRIL 20 MG PO TABS
10.0000 mg | ORAL_TABLET | Freq: Every day | ORAL | Status: DC
  Administered 2014-11-17 – 2014-11-18 (×2): 10 mg via ORAL
  Filled 2014-11-17 (×2): qty 1

## 2014-11-17 MED ORDER — METFORMIN HCL 500 MG PO TABS
500.0000 mg | ORAL_TABLET | Freq: Three times a day (TID) | ORAL | Status: DC
Start: 2014-11-18 — End: 2014-11-17

## 2014-11-17 MED ORDER — THROMBIN 5000 UNITS EX SOLR
CUTANEOUS | Status: DC | PRN
  Administered 2014-11-17 (×2): 5000 [IU] via TOPICAL

## 2014-11-17 MED ORDER — CEFAZOLIN SODIUM-DEXTROSE 2-3 GM-% IV SOLR
2.0000 g | INTRAVENOUS | Status: AC
  Administered 2014-11-17: 2 g via INTRAVENOUS
  Filled 2014-11-17: qty 50

## 2014-11-17 MED ORDER — PROPOFOL 10 MG/ML IV BOLUS
INTRAVENOUS | Status: AC
  Filled 2014-11-17: qty 20

## 2014-11-17 MED ORDER — BISACODYL 5 MG PO TBEC: 5.0000 mg | DELAYED_RELEASE_TABLET | Freq: Every day | ORAL | Status: DC | PRN

## 2014-11-17 MED ORDER — MORPHINE SULFATE (PF) 2 MG/ML IV SOLN: 1.0000 mg | INTRAVENOUS | Status: DC | PRN

## 2014-11-17 MED ORDER — METHOCARBAMOL 1000 MG/10ML IJ SOLN
500.0000 mg | Freq: Four times a day (QID) | INTRAVENOUS | Status: DC | PRN
  Filled 2014-11-17: qty 5

## 2014-11-17 MED ORDER — ALBUTEROL SULFATE HFA 108 (90 BASE) MCG/ACT IN AERS
INHALATION_SPRAY | RESPIRATORY_TRACT | Status: DC | PRN
  Administered 2014-11-17: 2 via RESPIRATORY_TRACT

## 2014-11-17 MED ORDER — ONDANSETRON HCL 4 MG/2ML IJ SOLN
INTRAMUSCULAR | Status: DC | PRN
  Administered 2014-11-17: 4 mg via INTRAVENOUS

## 2014-11-17 MED ORDER — HYDROMORPHONE HCL 1 MG/ML IJ SOLN
INTRAMUSCULAR | Status: AC
  Filled 2014-11-17: qty 1

## 2014-11-17 MED ORDER — FUROSEMIDE 20 MG PO TABS
20.0000 mg | ORAL_TABLET | ORAL | Status: DC
  Administered 2014-11-18: 20 mg via ORAL
  Filled 2014-11-17: qty 1

## 2014-11-17 MED ORDER — CELECOXIB 200 MG PO CAPS
200.0000 mg | ORAL_CAPSULE | Freq: Every day | ORAL | Status: DC
  Administered 2014-11-18: 200 mg via ORAL
  Filled 2014-11-17: qty 1

## 2014-11-17 MED ORDER — NEOSTIGMINE METHYLSULFATE 10 MG/10ML IV SOLN
INTRAVENOUS | Status: DC | PRN
  Administered 2014-11-17: 3 mg via INTRAVENOUS

## 2014-11-17 MED ORDER — METHOCARBAMOL 500 MG PO TABS
500.0000 mg | ORAL_TABLET | Freq: Four times a day (QID) | ORAL | Status: DC | PRN
  Administered 2014-11-17: 500 mg via ORAL
  Filled 2014-11-17: qty 1

## 2014-11-17 MED ORDER — HYDROMORPHONE HCL 1 MG/ML IJ SOLN
0.2500 mg | INTRAMUSCULAR | Status: DC | PRN
  Administered 2014-11-17: 0.5 mg via INTRAVENOUS

## 2014-11-17 MED ORDER — LIDOCAINE-EPINEPHRINE 0.5 %-1:200000 IJ SOLN
INTRAMUSCULAR | Status: DC | PRN
  Administered 2014-11-17: 3 mL

## 2014-11-17 MED ORDER — ROCURONIUM BROMIDE 100 MG/10ML IV SOLN
INTRAVENOUS | Status: DC | PRN
  Administered 2014-11-17: 10 mg via INTRAVENOUS
  Administered 2014-11-17: 20 mg via INTRAVENOUS
  Administered 2014-11-17: 40 mg via INTRAVENOUS

## 2014-11-17 MED ORDER — BUPROPION HCL ER (XL) 150 MG PO TB24
150.0000 mg | ORAL_TABLET | Freq: Every day | ORAL | Status: DC
  Administered 2014-11-18: 150 mg via ORAL
  Filled 2014-11-17: qty 1

## 2014-11-17 MED ORDER — METFORMIN HCL 500 MG PO TABS
500.0000 mg | ORAL_TABLET | Freq: Three times a day (TID) | ORAL | Status: DC
  Administered 2014-11-18 (×2): 500 mg via ORAL
  Filled 2014-11-17 (×2): qty 1

## 2014-11-17 MED ORDER — MENTHOL 3 MG MT LOZG: 1.0000 | LOZENGE | OROMUCOSAL | Status: DC | PRN

## 2014-11-17 MED ORDER — ALBUTEROL SULFATE (2.5 MG/3ML) 0.083% IN NEBU: 3.0000 mL | INHALATION_SOLUTION | Freq: Four times a day (QID) | RESPIRATORY_TRACT | Status: DC | PRN

## 2014-11-17 MED ORDER — PHENOL 1.4 % MT LIQD: 1.0000 | OROMUCOSAL | Status: DC | PRN

## 2014-11-17 MED ORDER — HEMOSTATIC AGENTS (NO CHARGE) OPTIME
TOPICAL | Status: DC | PRN
  Administered 2014-11-17: 1 via TOPICAL

## 2014-11-17 MED ORDER — LACTATED RINGERS IV SOLN
INTRAVENOUS | Status: DC
  Administered 2014-11-17 (×2): via INTRAVENOUS

## 2014-11-17 MED ORDER — PROMETHAZINE HCL 25 MG/ML IJ SOLN: 6.2500 mg | INTRAMUSCULAR | Status: DC | PRN

## 2014-11-17 MED ORDER — ONDANSETRON HCL 4 MG/2ML IJ SOLN
INTRAMUSCULAR | Status: AC
  Filled 2014-11-17: qty 2

## 2014-11-17 MED ORDER — MIDAZOLAM HCL 5 MG/5ML IJ SOLN
INTRAMUSCULAR | Status: DC | PRN
  Administered 2014-11-17: 2 mg via INTRAVENOUS

## 2014-11-17 SURGICAL SUPPLY — 66 items
BIT DRILL NEURO 2X3.1 SFT TUCH (MISCELLANEOUS) ×1 IMPLANT
BLADE CLIPPER SURG (BLADE) IMPLANT
BNDG GAUZE ELAST 4 BULKY (GAUZE/BANDAGES/DRESSINGS) ×4 IMPLANT
BUR DRUM 4.0 (BURR) ×2 IMPLANT
CANISTER SUCT 3000ML PPV (MISCELLANEOUS) ×2 IMPLANT
DECANTER SPIKE VIAL GLASS SM (MISCELLANEOUS) ×2 IMPLANT
DRAPE LAPAROTOMY 100X72 PEDS (DRAPES) ×2 IMPLANT
DRAPE MICROSCOPE LEICA (MISCELLANEOUS) ×2 IMPLANT
DRAPE POUCH INSTRU U-SHP 10X18 (DRAPES) ×2 IMPLANT
DRAPE PROXIMA HALF (DRAPES) IMPLANT
DRILL NEURO 2X3.1 SOFT TOUCH (MISCELLANEOUS) ×2
DURAPREP 6ML APPLICATOR 50/CS (WOUND CARE) ×2 IMPLANT
ELECT COATED BLADE 2.86 ST (ELECTRODE) ×2 IMPLANT
ELECT REM PT RETURN 9FT ADLT (ELECTROSURGICAL) ×2
ELECTRODE REM PT RTRN 9FT ADLT (ELECTROSURGICAL) ×1 IMPLANT
GAUZE SPONGE 4X4 16PLY XRAY LF (GAUZE/BANDAGES/DRESSINGS) IMPLANT
GLOVE BIO SURGEON STRL SZ 6.5 (GLOVE) IMPLANT
GLOVE BIO SURGEON STRL SZ7 (GLOVE) IMPLANT
GLOVE BIO SURGEON STRL SZ7.5 (GLOVE) IMPLANT
GLOVE BIO SURGEON STRL SZ8 (GLOVE) IMPLANT
GLOVE BIO SURGEON STRL SZ8.5 (GLOVE) IMPLANT
GLOVE BIOGEL M 8.0 STRL (GLOVE) ×2 IMPLANT
GLOVE ECLIPSE 6.5 STRL STRAW (GLOVE) ×2 IMPLANT
GLOVE ECLIPSE 7.0 STRL STRAW (GLOVE) IMPLANT
GLOVE ECLIPSE 7.5 STRL STRAW (GLOVE) IMPLANT
GLOVE ECLIPSE 8.0 STRL XLNG CF (GLOVE) IMPLANT
GLOVE ECLIPSE 8.5 STRL (GLOVE) IMPLANT
GLOVE EXAM NITRILE LRG STRL (GLOVE) IMPLANT
GLOVE EXAM NITRILE MD LF STRL (GLOVE) IMPLANT
GLOVE EXAM NITRILE XL STR (GLOVE) IMPLANT
GLOVE EXAM NITRILE XS STR PU (GLOVE) IMPLANT
GLOVE INDICATOR 6.5 STRL GRN (GLOVE) IMPLANT
GLOVE INDICATOR 7.0 STRL GRN (GLOVE) IMPLANT
GLOVE INDICATOR 7.5 STRL GRN (GLOVE) IMPLANT
GLOVE INDICATOR 8.0 STRL GRN (GLOVE) IMPLANT
GLOVE INDICATOR 8.5 STRL (GLOVE) IMPLANT
GLOVE OPTIFIT SS 8.0 STRL (GLOVE) IMPLANT
GLOVE SURG SS PI 6.5 STRL IVOR (GLOVE) IMPLANT
GOWN STRL REUS W/ TWL LRG LVL3 (GOWN DISPOSABLE) ×2 IMPLANT
GOWN STRL REUS W/ TWL XL LVL3 (GOWN DISPOSABLE) IMPLANT
GOWN STRL REUS W/TWL 2XL LVL3 (GOWN DISPOSABLE) IMPLANT
GOWN STRL REUS W/TWL LRG LVL3 (GOWN DISPOSABLE) ×2
GOWN STRL REUS W/TWL XL LVL3 (GOWN DISPOSABLE)
KIT BASIN OR (CUSTOM PROCEDURE TRAY) ×2 IMPLANT
KIT ROOM TURNOVER OR (KITS) ×2 IMPLANT
LIQUID BAND (GAUZE/BANDAGES/DRESSINGS) ×2 IMPLANT
NEEDLE HYPO 25X1 1.5 SAFETY (NEEDLE) ×2 IMPLANT
NEEDLE SPNL 22GX3.5 QUINCKE BK (NEEDLE) ×2 IMPLANT
NS IRRIG 1000ML POUR BTL (IV SOLUTION) ×2 IMPLANT
PACK LAMINECTOMY NEURO (CUSTOM PROCEDURE TRAY) ×2 IMPLANT
PAD ARMBOARD 7.5X6 YLW CONV (MISCELLANEOUS) ×6 IMPLANT
PIN DISTRACTION 14MM (PIN) ×4 IMPLANT
PLATE HELIX R 22MM (Plate) ×2 IMPLANT
RUBBERBAND STERILE (MISCELLANEOUS) ×4 IMPLANT
SCREW 4.0X13 (Screw) ×3 IMPLANT
SCREW 4.0X13MM (Screw) ×3 IMPLANT
SCREW HELIX SELF-TAP 4.5X11MM (Screw) ×2 IMPLANT
SPACER ACF PARALLEL 7MM (Bone Implant) ×2 IMPLANT
SPONGE INTESTINAL PEANUT (DISPOSABLE) ×2 IMPLANT
SPONGE SURGIFOAM ABS GEL SZ50 (HEMOSTASIS) ×2 IMPLANT
SUT VIC AB 0 CT1 27 (SUTURE) ×1
SUT VIC AB 0 CT1 27XBRD ANTBC (SUTURE) ×1 IMPLANT
SUT VIC AB 3-0 SH 8-18 (SUTURE) ×2 IMPLANT
TOWEL OR 17X24 6PK STRL BLUE (TOWEL DISPOSABLE) ×2 IMPLANT
TOWEL OR 17X26 10 PK STRL BLUE (TOWEL DISPOSABLE) ×2 IMPLANT
WATER STERILE IRR 1000ML POUR (IV SOLUTION) ×2 IMPLANT

## 2014-11-17 NOTE — Anesthesia Procedure Notes (Signed)
Procedure Name: Intubation Date/Time: 11/17/2014 1:23 PM Performed by: Wanita Chamberlain Pre-anesthesia Checklist: Patient identified, Timeout performed, Emergency Drugs available, Suction available and Patient being monitored Patient Re-evaluated:Patient Re-evaluated prior to inductionOxygen Delivery Method: Circle system utilized Preoxygenation: Pre-oxygenation with 100% oxygen Intubation Type: IV induction Ventilation: Mask ventilation without difficulty Laryngoscope Size: Mac and 3 Grade View: Grade I Tube type: Oral Tube size: 7.5 mm Number of attempts: 1 Airway Equipment and Method: Stylet Placement Confirmation: positive ETCO2,  ETT inserted through vocal cords under direct vision and breath sounds checked- equal and bilateral Secured at: 22 cm Tube secured with: Tape Dental Injury: Teeth and Oropharynx as per pre-operative assessment

## 2014-11-17 NOTE — H&P (Signed)
BP 181/71 mmHg  Pulse 79  Temp(Src) 98.8 F (37.1 C) (Oral)  Resp 18  Ht 5\' 3"  (1.6 m)  Wt 95.255 kg (210 lb)  BMI 37.21 kg/m2  SpO2 94%  LMP 07/03/2011  Debbie Mullins is a 59 year old woman who presents today for evaluation of difficulty with her left shoulder, weakness in the left shoulder, and problems with her balance and gait. She says that she has been using a walker now for approximately a year. She found herself simply falling, and falling fairly frequently before she starting using it. She also presents with fairly profound weakness of the left shoulder, and is unable to abduct it. Now she does have a full thickness rotator cuff tear on the left side, but during the investigation of the shoulder and of her balance, an MRI of the cervical spine revealed significant cord compression at C4-5, and severe foraminal narrowing especially on the left at C4-5. She says that both of her hands are numb. It is only the left upper extremity which is weak. She is currently disabled and right-handed.  PAST MEDICAL HISTORY: Past medical history is significant for hypertension, diabetes, asthma, and arthritis.  PAST SURGICAL HISTORY: She has undergone knee replacements on the right, left, and left again. FAMILY HISTORY: Mother and father are both deceased. Diabetes, hypertension, COPD, heart disease, and kidney disease present in the family history.  ALLERGIES: No known drug allergies. CURRENT MEDICATIONS: Include metformin, Wellbutrin, Lasix, flavocoxid, etodolac, esomeprazole, Decadron, lisinopril, and aspirin. SOCIAL HISTORY: She does smoke a pack of cigarettes a day. She does use alcohol. She does not use illicit drugs.  PHYSICAL EXAMINATION: Vital signs are as follows, height 5 feet 3 inches, weight 207 pounds, temperature is 98.3 degrees Fahrenheit, blood pressure is 171/72, pulse is 109. She has absolutely no pain in the neck or shoulder. She is numb in both upper extremities.  On exam she has  0/5 left shoulder abduction. She has weakness in her biceps at 3/5, triceps is approximately 5-/5, grip is 5-/5, all of these being on the left. Intrisics are about 5-/5, normal strength in the right upper extremity. Romberg test shows a lot of swaying, but she does not fall. She is unable to tandem walk. Muscle tone is normal except in the left shoulder, she has significant atrophy of the left shoulder girdle. Pupils are equal, round, and reactive to light. Full extraocular movements, full visual fields. Hearing is intact to voice bilaterally. Uvula elevates in the midline. Shoulder shrug is normal. Tongue protrudes in the midline. Language is normal. Speech is clear. It is fluent. She has 2+ reflexes in biceps, triceps, brachioradialis, 2+ at the knees, 2+ at the ankles. Toes are downgoing to plantar stimulation. Proprioception is intact in the upper extremities.  IMAGING STUDIES: MRI is reviewed from 10/2014 and it shows fairly profound cord compression at C4-5 with altered signal. She has a great deal of spondylitic change in the cervical spine, but this is the one level where the cord is clearly compromised. The foraminal stenosis is significant left side much worse than right. 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  IMPRESSION/PLAN: I have explained to Mrs. Wolden that I think she needs to have the cervical spine decompressed via an anterior cervical decompression and arthrodesis. I gave her a detailed instruction sheet going over the operation. We did discuss risks, paralysis, weakness in one or both arms, worsening neurological status, bowel and bladder dysfunction. There are also many other things we did discuss,  damage to the vocal cords, the fact that the fusion may not work, that the plate may fail. We will get her scheduled as soon as possible.

## 2014-11-17 NOTE — Op Note (Signed)
11/17/2014  3:33 PM  PATIENT:  Debbie Mullins  59 y.o. 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 Keir was taken to the operating room, intubated, and placed under general anesthesia without difficulty. She was positioned supine with her head in slight extension on a horseshoe headrest. The neck was prepped and draped in a sterile manner. I infiltrated 4 cc's 1/2%lidocaine/1:200,000 strength epinephrine into the planned incision starting from the midline to the medial border of the left sternocleidomastoid muscle. I opened the incision with a 10 blade and dissected sharply through soft tissue to the platysma. I dissected in the plane superior to the platysma both rostrally and caudally. I then opened the platysma in a horizontal fashion with Metzenbaum scissors, and dissected in the inferior plane rostrally and caudally. With both blunt and sharp technique I created an avascular corridor to the cervical spine. I placed a spinal needle(s) in the disc space at C4/5 . I then reflected the longus colli from C4 to C5 and placed self retaining retractors. I opened the disc space(s) at C4/5 with a 15 blade. I removed disc with curettes, Kerrison punches, and the drill. Using the drill I removed osteophytes and prepared for the decompression.  I decompressed the spinal canal and the C5 root(s) with the drill, Kerrison punches, and the curettes. I used the  microscope to aid in microdissection. I removed the posterior longitudinal ligament to fully expose and decompress the thecal sac. I exposed the roots laterally taking down the C4/5 uncovertebral joints. With the decompression complete we moved on to the arthrodesis. We used the drill to level the surfaces of C4, and 5. I removed soft tissue to prepare the disc space and the bony surfaces. I measured the space and placed a 37mm structural allograft into the disc space.  We then placed the anterior instrumentation. I placed 2 screws in each vertebral body through the plate. I locked the screws into place. Intraoperative xray showed the graft, plate, and screws to be in good position. I irrigated the wound, achieved hemostasis, and closed the wound in layers. I approximated the platysma, and the subcuticular plane with vicryl sutures. I used Dermabond for a sterile dressing.   PLAN OF CARE: Admit for overnight observation  PATIENT DISPOSITION:  PACU - hemodynamically stable.   Delay start of Pharmacological VTE agent (>24hrs) due to surgical blood loss or risk of bleeding:  yes

## 2014-11-17 NOTE — Transfer of Care (Signed)
Immediate Anesthesia Transfer of Care Note  Patient: Debbie Mullins  Procedure(s) Performed: Procedure(s) with comments: Cervical four- cervical five Anterior Cervical Decompression with fusion and bonegraft (N/A) - C45 anterior cervical decompression with fusion plating and bonegraft  Patient Location: PACU  Anesthesia Type:General  Level of Consciousness: awake, alert  and oriented  Airway & Oxygen Therapy: Patient Spontanous Breathing and non-rebreather face mask  Post-op Assessment: Report given to RN  Post vital signs: Reviewed and stable  Last Vitals:  Filed Vitals:   11/17/14 0945  BP: 181/71  Pulse:   Temp:   Resp:     Complications: No apparent anesthesia complications

## 2014-11-17 NOTE — Anesthesia Preprocedure Evaluation (Addendum)
Anesthesia Evaluation  Patient identified by MRN, date of birth, ID band Patient awake    Reviewed: Allergy & Precautions, NPO status , Patient's Chart, lab work & pertinent test results  Airway Mallampati: II  TM Distance: >3 FB Neck ROM: Full    Dental  (+) Edentulous Upper, Edentulous Lower, Dental Advisory Given   Pulmonary shortness of breath, asthma , pneumonia, COPD, Current Smoker,    breath sounds clear to auscultation       Cardiovascular hypertension, +CHF   Rhythm:Regular Rate:Normal     Neuro/Psych    GI/Hepatic Neg liver ROS, GERD  ,  Endo/Other  diabetes  Renal/GU Renal disease     Musculoskeletal   Abdominal   Peds  Hematology   Anesthesia Other Findings   Reproductive/Obstetrics                           Anesthesia Physical Anesthesia Plan  ASA: III  Anesthesia Plan: General   Post-op Pain Management:    Induction: Intravenous  Airway Management Planned: Oral ETT  Additional Equipment:   Intra-op Plan:   Post-operative Plan: Possible Post-op intubation/ventilation  Informed Consent: I have reviewed the patients History and Physical, chart, labs and discussed the procedure including the risks, benefits and alternatives for the proposed anesthesia with the patient or authorized representative who has indicated his/her understanding and acceptance.   Dental advisory given  Plan Discussed with: CRNA and Anesthesiologist  Anesthesia Plan Comments:         Anesthesia Quick Evaluation

## 2014-11-18 ENCOUNTER — Encounter (HOSPITAL_COMMUNITY): Payer: Self-pay | Admitting: Neurosurgery

## 2014-11-18 DIAGNOSIS — I10 Essential (primary) hypertension: Secondary | ICD-10-CM | POA: Diagnosis not present

## 2014-11-18 DIAGNOSIS — M4712 Other spondylosis with myelopathy, cervical region: Secondary | ICD-10-CM | POA: Diagnosis not present

## 2014-11-18 DIAGNOSIS — M5022 Other cervical disc displacement, mid-cervical region: Secondary | ICD-10-CM | POA: Diagnosis not present

## 2014-11-18 DIAGNOSIS — J449 Chronic obstructive pulmonary disease, unspecified: Secondary | ICD-10-CM | POA: Diagnosis not present

## 2014-11-18 DIAGNOSIS — F17210 Nicotine dependence, cigarettes, uncomplicated: Secondary | ICD-10-CM | POA: Diagnosis not present

## 2014-11-18 DIAGNOSIS — Z23 Encounter for immunization: Secondary | ICD-10-CM | POA: Diagnosis not present

## 2014-11-18 DIAGNOSIS — E119 Type 2 diabetes mellitus without complications: Secondary | ICD-10-CM | POA: Diagnosis not present

## 2014-11-18 LAB — GLUCOSE, CAPILLARY
Glucose-Capillary: 172 mg/dL — ABNORMAL HIGH (ref 65–99)
Glucose-Capillary: 177 mg/dL — ABNORMAL HIGH (ref 65–99)

## 2014-11-18 MED ORDER — METHOCARBAMOL 750 MG PO TABS: 750.0000 mg | ORAL_TABLET | Freq: Four times a day (QID) | ORAL | Status: DC | PRN

## 2014-11-18 MED ORDER — HYDROCODONE-ACETAMINOPHEN 5-325 MG PO TABS: 1.0000 | ORAL_TABLET | Freq: Four times a day (QID) | ORAL | Status: DC | PRN

## 2014-11-18 NOTE — Anesthesia Postprocedure Evaluation (Signed)
  Anesthesia Post-op Note  Patient: Debbie Mullins  Procedure(s) Performed: Procedure(s) with comments: Cervical four- cervical five Anterior Cervical Decompression with fusion and bonegraft (N/A) - C45 anterior cervical decompression with fusion plating and bonegraft  Patient Location: PACU  Anesthesia Type:General  Level of Consciousness: awake  Airway and Oxygen Therapy: Patient Spontanous Breathing  Post-op Pain: mild  Post-op Assessment: Post-op Vital signs reviewed              Post-op Vital Signs: Reviewed  Last Vitals:  Filed Vitals:   11/18/14 1239  BP: 180/82  Pulse: 65  Temp: 36.9 C  Resp: 20    Complications: No apparent anesthesia complications

## 2014-11-18 NOTE — Evaluation (Signed)
Physical Therapy Evaluation Patient Details Name: Debbie Mullins MRN: [id number] DOB: February 28, 1956 Today's Date: 11/18/2014   History of Present Illness  Pt is 59 y.o. female c/o difficulty with left shoulder, left shoulder weakness, problems with balance and gait and history of falls. 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 Pt is s/p cervical decompression C4-5, pt was diagonsed with Left shoulder rotator cuff tear. PHM includes significant HTN, diabetes, asthma and arthritis  Clinical Impression  Pt admitted with above diagnosis. Pt currently with functional limitations due to the deficits listed below (see PT Problem List). At the time of PT eval pt was able to perform transfers and ambulation with min guard to mod assist. Pt would benefit from further bed mobility training. Pt will benefit from skilled PT to increase their independence and safety with mobility to allow discharge to the venue listed below.       Follow Up Recommendations Home health PT;Supervision for mobility/OOB    Equipment Recommendations  3in1 (PT)    Recommendations for Other Services       Precautions / Restrictions Precautions Precautions: Cervical;Fall Precaution Comments: Reviewed precautions with pt. She did not recall that OT had educated her on precautions during session.  Restrictions Weight Bearing Restrictions: No      Mobility  Bed Mobility Overal bed mobility: Needs Assistance Bed Mobility: Sit to Supine;Supine to Sit;Rolling;Sit to Sidelying Rolling: Supervision   Supine to sit: Supervision Sit to supine: Supervision Sit to sidelying: Mod assist General bed mobility comments: Pt was instructed in log roll for cervical precautions, however she is unable to complete without mod assist. Pt demonstrated how she gets in and out of bed at home - and states she tied a string to her headboard to pull herself up with the RUE. Supervision with  bedrail use on the R side (to simulate string at home), however was not maintaining precautions. Pt reports no pain or feeling of stress to the cervical area with bed mobility.   Transfers Overall transfer level: Needs assistance Equipment used: Rolling walker (2 wheeled) Transfers: Sit to/from Stand Sit to Stand: Min guard         General transfer comment: Pt appeared unsteady during sit to stand transfer. Close guard for safety but no physical assist required.   Ambulation/Gait Ambulation/Gait assistance: Min guard Ambulation Distance (Feet): 400 Feet Assistive device: Rolling walker (2 wheeled) Gait Pattern/deviations: Decreased stride length;Step-to pattern;Trunk flexed Gait velocity: Decreased Gait velocity interpretation: Below normal speed for age/gender General Gait Details: Antalgic gait pattern from previous L TKA and residual weakness. Pt did not require assistance with RW, however pt states that her knees have buckled in the past and she has had falls at home.   Stairs            Wheelchair Mobility    Modified Rankin (Stroke Patients Only)       Balance Overall balance assessment: Needs assistance Sitting-balance support: Feet supported;No upper extremity supported Sitting balance-Leahy Scale: Good     Standing balance support: Bilateral upper extremity supported;During functional activity Standing balance-Leahy Scale: Poor                               Pertinent Vitals/Pain Pain Assessment: No/denies pain    Home Living Family/patient expects to be discharged to:: Private residence Living Arrangements: Other relatives (Pt has cousin who lives with her) Available Help at  Discharge: Family;Available PRN/intermittently Type of Home: House Home Access: Ramped entrance;Level entry     Home Layout: One level Home Equipment: Walker - 4 wheels;Hand held shower head;Adaptive equipment Additional Comments: Pt requesting BSC    Prior  Function Level of Independence: Needs assistance   Gait / Transfers Assistance Needed: RW at baseline after she had falls with [facility]  ADL's / Homemaking Assistance Needed: Pt states she "made do" with ADL's - indicates she could use assistance at home but has been denied in the past so she "does what she can" (appears she is still able to complete all ADL's)        Hand Dominance   Dominant Hand: Right    Extremity/Trunk Assessment   Upper Extremity Assessment: Defer to OT evaluation RUE Deficits / Details: Pt c/o of numbness     LUE Deficits / Details: LUE rotator cuff tear   Lower Extremity Assessment: Generalized weakness;LLE deficits/detail   LLE Deficits / Details: Previous L TKA which pt states continues to give her "problems"  Cervical / Trunk Assessment: Normal  Communication   Communication: No difficulties (Difficult to understand at times - conversation is erratic )  Cognition Arousal/Alertness: Awake/alert Behavior During Therapy: WFL for tasks assessed/performed Overall Cognitive Status: Within Functional Limits for tasks assessed Area of Impairment: Memory;Attention     Memory: Decreased short-term memory              General Comments General comments (skin integrity, edema, etc.): Pt reported RUE numbness    Exercises        Assessment/Plan    PT Assessment Patient needs continued PT services  PT Diagnosis Difficulty walking   PT Problem List Decreased strength;Decreased range of motion;Decreased activity tolerance;Decreased balance;Decreased mobility;Decreased knowledge of use of DME;Decreased safety awareness;Decreased knowledge of precautions  PT Treatment Interventions DME instruction;Stair training;Gait training;Functional mobility training;Therapeutic activities;Therapeutic exercise;Neuromuscular re-education;Patient/family education   PT Goals (Current goals can be found in the Care Plan section) Acute Rehab PT Goals Patient Stated Goal: I  want to be independent PT Goal Formulation: With patient Time For Goal Achievement: 11/25/14 Potential to Achieve Goals: Good    Frequency Min 5X/week   Barriers to discharge        Co-evaluation               End of Session Equipment Utilized During Treatment: Gait belt Activity Tolerance: Patient tolerated treatment well Patient left: in bed;with call bell/phone within reach (Sitting EOB) Nurse Communication: Mobility status    Functional Assessment Tool Used: Clinic[place]l judgement Functional Limitation: Mobility: Walking and moving around Mobility: Walking and Moving Around Current Status (K2409): At least 20 percent but less than 40 percent impaired, limited or restricted Mobility: Walking and Moving Around Goal Status [phone]): At least 20 percent but less than 40 percent impaired, limited or restricted    Time: 0916-0936 PT Time Calculation (min) (ACUTE ONLY): 20 min   Charges:   PT Evaluation $Initial PT Evaluation Tier I: 1 Procedure     PT G Codes:   PT G-Codes **NOT FOR INPATIENT CLASS** Functional Assessment Tool Used: Clinicakl judgement Functional Limitation: Mobility: Walking and moving around Mobility: Walking and Moving Around Current Status ([id number]): At least 20 percent but less than 40 percent impaired, limited or restricted Mobility: Walking and Moving Around Goal Status [phone]): At least 20 percent but less than 40 percent impaired, limited or restricted    Rolinda Roan 11/18/2014, 10:03 AM   Rolinda Roan, PT, DPT Acute Rehabilitation Services Pager:  [phone]   

## 2014-11-18 NOTE — Progress Notes (Signed)
Pt and sister given D/C instructions with Rx's, verbal understanding was provided. Pt's incision is closed with dermabond and has no sign of inection. Pt's IV was removed prior to D/C. Pt's Home Health was arranged and she received 3-N-1 prior to D/C. Pt D/C'd home via wheelchair @ 1630 per MD order. Pt is stable @ D/C and has no other needs at this time. Holli Humbles, RN

## 2014-11-18 NOTE — Evaluation (Signed)
Occupational Therapy Evaluation and Discharge Patient Details Name: Debbie Mullins MRN: [id number] DOB: 1955-06-02 Today's Date: 11/18/2014    History of Present Illness Pt is 59 y.o. female c/o difficulty with left shoulder, left shoulder weakness, problems with balance and gait and history of falls. 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 Pt is s/p cervical decompression C4-5, pt was diagonsed with Left shoulder rotator cuff tear. PHM includes significant HTN, diabetes, asthma and arthritis   Clinical Impression   Pt admitted to hospital due to reason stated above. Pt currently with functional limitiations due to difficulty with L UE and L UE weakness and s/p cervical neck decompression C4-5. Prior to admission pt was modified independent with ADLs. Pt currently requires min-max A and verbal cues for ADLs/IADLs to maintain cervical neck precautions. Pt was educated on cervical neck precautions for body mechanics and positioning required for ADL/IADL safety. Pt will benefit from skilled OT to increase her independence and safety with ADLs/IADLs and maintaining cervical neck precautions to allow discharge to venue listed below.    Follow Up Recommendations  Home health OT    Equipment Recommendations  3 in 1 bedside comode   Recommendations for Other Services Other (comment) (Pt is requesting an Aid once d/c home)     Precautions / Restrictions Precautions Precautions: Cervical;Fall Precaution Comments: Reviewed precautions with pt. She did not recall that OT had educated her on precautions during session.  Restrictions Weight Bearing Restrictions: No      Mobility Bed Mobility Overal bed mobility: Needs Assistance Bed Mobility: Sit to Supine;Supine to Sit;Rolling;Sit to Sidelying Rolling: Supervision   Supine to sit: Supervision Sit to supine: Supervision Sit to sidelying: Mod assist General bed mobility comments:  Pt was instructed in log roll for cervical precautions, however she is unable to complete without mod assist. Pt demonstrated how she gets in and out of bed at home - and states she tied a string to her headboard to pull herself up with the RUE. Supervision with bedrail use on the R side (to simulate string at home), however was not maintaining precautions. Pt reports no pain or feeling of stress to the cervical area with bed mobility.   Transfers Overall transfer level: Needs assistance Equipment used: Rolling walker (2 wheeled) Transfers: Sit to/from Stand Sit to Stand: Min guard         General transfer comment: Pt appeared unsteady during sit to stand transfer. Close guard for safety but no physical assist required.     Balance Overall balance assessment: Needs assistance Sitting-balance support: Feet supported;No upper extremity supported Sitting balance-Leahy Scale: Good     Standing balance support: Bilateral upper extremity supported;During functional activity Standing balance-Leahy Scale: Poor                              ADL Overall ADL's : Needs assistance/impaired Eating/Feeding: Supervision/ safety;Set up;Sitting;Cueing for safety Eating/Feeding Details (indicate cue type and reason): verbal cues to adhere to cervical neck precautions Grooming: Set up;Sitting;Cueing for safety;Moderate assistance Grooming Details (indicate cue type and reason): verbal cues to adhere to cervical neck precautions Upper Body Bathing: Set up;With adaptive equipment;Sitting;Moderate assistance Upper Body Bathing Details (indicate cue type and reason): verbal cues to adhere to cervical neck precautions Lower Body Bathing: Set up;Sit to/from stand;Moderate assistance   Upper Body Dressing : Set up;Sitting;Moderate assistance Upper Body Dressing Details (indicate cue type and reason):  verbal cues to adhere to cervical neck precautions Lower Body Dressing: Set up;Cueing for  safety;Sit to/from stand;Moderate assistance Lower Body Dressing Details (indicate cue type and reason): verbal cues to adhere to cervical neck precautions             Functional mobility during ADLs: Rolling walker;Minimal assistance General ADL Comments: Pt has deficits in bilateral UE     Vision     Perception     Praxis      Pertinent Vitals/Pain Pain Assessment: No/denies pain     Hand Dominance Right   Extremity/Trunk Assessment Upper Extremity Assessment Upper Extremity Assessment: Defer to OT evaluation RUE Deficits / Details: Pt c/o of numbness LUE Deficits / Details: LUE rotator cuff tear LUE: Unable to fully assess due to long standing shoulder limitations LUE Coordination:decreased gross motor   Lower Extremity Assessment Lower Extremity Assessment: Generalized weakness;LLE deficits/detail LLE Deficits / Details: Previous L TKA which pt states continues to give her "problems"   Cervical / Trunk Assessment Cervical / Trunk Assessment: Normal   Communication Communication Communication: No difficulties (Difficult to understand at times - conversation is erratic )   Cognition Arousal/Alertness: Awake/alert Behavior During Therapy: WFL for tasks assessed/performed Overall Cognitive Status: Impaired/different from baseline Area of Impairment: Memory;Attention   Current Attention Level: Selective Memory: Decreased short-term memory             General Comments       Exercises       Shoulder Instructions      Home Living Family/patient expects to be discharged to:: Private residence Living Arrangements: Other relatives (Pt has cousin who lives with her) Available Help at Discharge: Family;Available PRN/intermittently Type of Home: House Home Access: Ramped entrance;Level entry     Home Layout: One level     Bathroom Shower/Tub: Occupational psychologist: Standard Bathroom Accessibility: Yes How Accessible: Accessible via  walker Home Equipment: [place] - 4 wheels;Hand held shower head;Adaptive equipment Adaptive Equipment: Reacher Additional Comments: Pt requesting BSC      Prior Functioning/Environment Level of Independence: Needs assistance  Gait / Transfers Assistance Needed: RW at baseline after she had falls with [facility] ADL's / Homemaking Assistance Needed: Pt states she "made do" with ADL's - indicates she could use assistance at home but has been denied in the past so she "does what she can" (appears she is still able to complete all ADL's)        OT Diagnosis: Generalized weakness   OT Problem List: Decreased strength;Decreased range of motion;Impaired balance (sitting and/or standing)   OT Treatment/Interventions:     OT Goals(Current goals can be found in the care plan section) Acute Rehab OT Goals Patient Stated Goal: I want to be independent OT Goal Formulation: With patient Time For Goal Achievement: 12/02/14 Potential to Achieve Goals: Fair  OT Frequency:    Barriers to D/C:            Co-evaluation              End of Session Equipment Utilized During Treatment: Rolling walker Nurse Communication: Precautions (Pt has a handout regarding cervical neck precautions)  Activity Tolerance: Patient tolerated treatment well Patient left: in bed;with call bell/phone within reach   Time: 0833-0850 OT Time Calculation (min): 17 min Charges:  OT General Charges $OT Visit: 1 Procedure OT Evaluation $Initial OT Evaluation Tier I: 1 Procedure G-Codes: OT G-codes **NOT FOR INPATIENT CLASS** Functional Assessment Tool Used: clinical judgement Functional Limitation: Self care  Self Care Current Status [id number]): At least 40 percent but less than 60 percent impaired, limited or restricted Self Care Goal Status (Y4314): At least 40 percent but less than 60 percent impaired, limited or restricted Self Care Discharge Status [id number]): At least 40 percent but less than 60 percent impaired,  limited or restricted  Debbie Mullins 11/18/2014, 10:13 AM

## 2014-11-18 NOTE — Care Management Note (Signed)
Case Management Note  Patient Details  Name: MIKAYLA CHIUSANO MRN: [id number] Date of Birth: Jul 18, 1955  Subjective/Objective:      59 yr old female s/p C4/5 cervical decompression.              Action/Plan: Case manager spoke with patient concerning home health needs. Choice was offered. Address was confirmed. Referral was called to [facility], [facility].   Expected Discharge Date:     11/18/14             Expected Discharge Plan:   Home with Home Health  In-House Referral:  NA  Discharge planning Services  CM Consult  Post Acute Care Choice:  Durable Medical Equipment Choice offered to:  Patient  DME Arranged:    DME Agency:     HH Arranged:  PT, OT HH Agency:  [place]  Status of Service:  Completed, signed off  Medicare Important Message Given:    Date Medicare IM Given:    Medicare IM give by:    Date Additional Medicare IM Given:    Additional Medicare Important Message give by:     If discussed at [place] of Stay Meetings, dates discussed:    Additional Comments:  Ninfa Meeker, RN 11/18/2014, 4:13 PM

## 2014-11-18 NOTE — Discharge Summary (Signed)
Physician Discharge Summary  Patient ID: Debbie Mullins MRN: [id number] DOB/AGE: [birth year] 59 y.o.  Admit date: 11/17/2014 Discharge date: 11/18/2014  Admission Diagnoses:Osteoarthritis with myelopathy, cervical region  Discharge Diagnoses:  Active Problems:   Osteoarthritis of spine with myelopathy, cervical region   Discharged Condition: good  Hospital Course: Ms. Pablo was admitted and taken to the operating room for an uncomplicated anterior cervical discetomy and arthrodesis at C4/5. She is moving all extremities well. Her wound is clean, dry, and without signs of infection. She is ambulating with her walker. Physical therapy recommended home health pt, and ot.Ms. Durney is able to ambulate, and void. She is tolerating a regular diet.   Treatments: surgery: Anterior Cervical decompression C4/5 Arthrodesis C4/5 with 13mm structural allograft Anterior instrumentation(Nuvasive Helix-R) C4/5   Discharge Exam: Blood pressure 180/82, pulse 65, temperature 98.5 F (36.9 C), temperature source Oral, resp. rate 20, height 5\' 3"  (1.6 m), weight 95.255 kg (210 lb), last menstrual period 07/03/2011, SpO2 96 %. General appearance: alert, cooperative, appears stated age and no distress Neurologic: Mental status: Alert, oriented, thought content appropriate Cranial nerves: normal Motor: mild spasticity  Disposition: 03-Skilled Nursing Facility cervical herniated disc    Medication List    TAKE these medications        acetaminophen 500 MG tablet  Commonly known as:  TYLENOL  Take 500 mg by mouth every 6 (six) hours as needed for mild pain.     albuterol 108 (90 BASE) MCG/ACT inhaler  Commonly known as:  PROVENTIL HFA;VENTOLIN HFA  Inhale 2 puffs into the lungs every 6 (six) hours as needed. For shortness of breath or wheezing     aspirin EC 81 MG tablet  Take 81 mg by mouth daily.     aspirin 81 MG tablet  Take 81 mg by mouth daily.     aspirin EC 325 MG tablet   Take 1 tablet (325 mg total) by mouth daily.     buPROPion 150 MG 24 hr tablet  Commonly known as:  WELLBUTRIN XL  Take 150 mg by mouth daily.     celecoxib 200 MG capsule  Commonly known as:  CELEBREX  Take 200 mg by mouth daily.     dexamethasone 4 MG tablet  Commonly known as:  DECADRON  Take 4 mg by mouth daily.     DSS 100 MG Caps  Take 100 mg by mouth 2 (two) times daily.     esomeprazole 40 MG capsule  Commonly known as:  NEXIUM  Take 40 mg by mouth daily before breakfast.     etodolac 400 MG tablet  Commonly known as:  LODINE  Take 400 mg by mouth 2 (two) times daily.     furosemide 20 MG tablet  Commonly known as:  LASIX  Take 1 tablet (20 mg total) by mouth every other day.     HYDROcodone-acetaminophen 10-325 MG per tablet  Commonly known as:  NORCO  Take 1 tablet by mouth every 6 (six) hours as needed for moderate pain.     lisinopril 10 MG tablet  Commonly known as:  PRINIVIL,ZESTRIL  Take 10 mg by mouth daily.     metFORMIN 500 MG tablet  Commonly known as:  GLUCOPHAGE  Take 500 mg by mouth 3 (three) times daily.     methocarbamol 500 MG tablet  Commonly known as:  ROBAXIN  Take 1 tablet (500 mg total) by mouth every 6 (six) hours as needed for muscle spasms (spasm).  montelukast 10 MG tablet  Commonly known as:  SINGULAIR  Take 10 mg by mouth at bedtime.     nicotine 21 mg/24hr patch  Commonly known as:  NICODERM CQ - dosed in mg/24 hours  Place 1 patch onto the skin daily. Uses occasionally     oxyCODONE-acetaminophen 5-325 MG per tablet  Commonly known as:  ROXICET  Take 1-2 tablets by mouth every 4 (four) hours as needed.     senna-docusate 8.6-50 MG per tablet  Commonly known as:  Senokot-S  Take 1 tablet by mouth at bedtime as needed for mild constipation.           Follow-up Information    Follow up with Drisana Schweickert L, MD In 3 weeks.   Specialty:  Neurosurgery   Why:  call office to make an appointment   Contact  information:   [address] [place] [address] [phone]       Signed: Winfield Cunas 11/18/2014, 2:06 PM

## 2014-11-18 NOTE — Discharge Instructions (Signed)

## 2014-11-22 DIAGNOSIS — J45909 Unspecified asthma, uncomplicated: Secondary | ICD-10-CM | POA: Diagnosis not present

## 2014-11-22 DIAGNOSIS — E119 Type 2 diabetes mellitus without complications: Secondary | ICD-10-CM | POA: Diagnosis not present

## 2014-11-22 DIAGNOSIS — M4712 Other spondylosis with myelopathy, cervical region: Secondary | ICD-10-CM | POA: Diagnosis not present

## 2014-11-22 DIAGNOSIS — I10 Essential (primary) hypertension: Secondary | ICD-10-CM | POA: Diagnosis not present

## 2014-11-22 DIAGNOSIS — Z4789 Encounter for other orthopedic aftercare: Secondary | ICD-10-CM | POA: Diagnosis not present

## 2014-11-23 DIAGNOSIS — J45909 Unspecified asthma, uncomplicated: Secondary | ICD-10-CM | POA: Diagnosis not present

## 2014-11-23 DIAGNOSIS — Z4789 Encounter for other orthopedic aftercare: Secondary | ICD-10-CM | POA: Diagnosis not present

## 2014-11-23 DIAGNOSIS — E119 Type 2 diabetes mellitus without complications: Secondary | ICD-10-CM | POA: Diagnosis not present

## 2014-11-23 DIAGNOSIS — M4712 Other spondylosis with myelopathy, cervical region: Secondary | ICD-10-CM | POA: Diagnosis not present

## 2014-11-23 DIAGNOSIS — I10 Essential (primary) hypertension: Secondary | ICD-10-CM | POA: Diagnosis not present

## 2014-11-24 DIAGNOSIS — E119 Type 2 diabetes mellitus without complications: Secondary | ICD-10-CM | POA: Diagnosis not present

## 2014-11-24 DIAGNOSIS — J45909 Unspecified asthma, uncomplicated: Secondary | ICD-10-CM | POA: Diagnosis not present

## 2014-11-24 DIAGNOSIS — Z4789 Encounter for other orthopedic aftercare: Secondary | ICD-10-CM | POA: Diagnosis not present

## 2014-11-24 DIAGNOSIS — I10 Essential (primary) hypertension: Secondary | ICD-10-CM | POA: Diagnosis not present

## 2014-11-24 DIAGNOSIS — M4712 Other spondylosis with myelopathy, cervical region: Secondary | ICD-10-CM | POA: Diagnosis not present

## 2014-11-29 DIAGNOSIS — E119 Type 2 diabetes mellitus without complications: Secondary | ICD-10-CM | POA: Diagnosis not present

## 2014-11-29 DIAGNOSIS — M4712 Other spondylosis with myelopathy, cervical region: Secondary | ICD-10-CM | POA: Diagnosis not present

## 2014-11-29 DIAGNOSIS — J45909 Unspecified asthma, uncomplicated: Secondary | ICD-10-CM | POA: Diagnosis not present

## 2014-11-29 DIAGNOSIS — I10 Essential (primary) hypertension: Secondary | ICD-10-CM | POA: Diagnosis not present

## 2014-11-29 DIAGNOSIS — Z4789 Encounter for other orthopedic aftercare: Secondary | ICD-10-CM | POA: Diagnosis not present

## 2014-12-01 DIAGNOSIS — E119 Type 2 diabetes mellitus without complications: Secondary | ICD-10-CM | POA: Diagnosis not present

## 2014-12-01 DIAGNOSIS — I10 Essential (primary) hypertension: Secondary | ICD-10-CM | POA: Diagnosis not present

## 2014-12-01 DIAGNOSIS — J45909 Unspecified asthma, uncomplicated: Secondary | ICD-10-CM | POA: Diagnosis not present

## 2014-12-01 DIAGNOSIS — M4712 Other spondylosis with myelopathy, cervical region: Secondary | ICD-10-CM | POA: Diagnosis not present

## 2014-12-01 DIAGNOSIS — Z4789 Encounter for other orthopedic aftercare: Secondary | ICD-10-CM | POA: Diagnosis not present

## 2014-12-06 DIAGNOSIS — J9801 Acute bronchospasm: Secondary | ICD-10-CM | POA: Diagnosis not present

## 2014-12-06 DIAGNOSIS — R7309 Other abnormal glucose: Secondary | ICD-10-CM | POA: Diagnosis not present

## 2014-12-06 DIAGNOSIS — E785 Hyperlipidemia, unspecified: Secondary | ICD-10-CM | POA: Diagnosis not present

## 2014-12-06 DIAGNOSIS — I10 Essential (primary) hypertension: Secondary | ICD-10-CM | POA: Diagnosis not present

## 2014-12-06 DIAGNOSIS — R634 Abnormal weight loss: Secondary | ICD-10-CM | POA: Diagnosis not present

## 2014-12-07 DIAGNOSIS — M4712 Other spondylosis with myelopathy, cervical region: Secondary | ICD-10-CM | POA: Diagnosis not present

## 2014-12-07 DIAGNOSIS — Z4789 Encounter for other orthopedic aftercare: Secondary | ICD-10-CM | POA: Diagnosis not present

## 2014-12-07 DIAGNOSIS — E119 Type 2 diabetes mellitus without complications: Secondary | ICD-10-CM | POA: Diagnosis not present

## 2014-12-07 DIAGNOSIS — J45909 Unspecified asthma, uncomplicated: Secondary | ICD-10-CM | POA: Diagnosis not present

## 2014-12-07 DIAGNOSIS — I10 Essential (primary) hypertension: Secondary | ICD-10-CM | POA: Diagnosis not present

## 2014-12-09 DIAGNOSIS — J45909 Unspecified asthma, uncomplicated: Secondary | ICD-10-CM | POA: Diagnosis not present

## 2014-12-09 DIAGNOSIS — I10 Essential (primary) hypertension: Secondary | ICD-10-CM | POA: Diagnosis not present

## 2014-12-09 DIAGNOSIS — Z4789 Encounter for other orthopedic aftercare: Secondary | ICD-10-CM | POA: Diagnosis not present

## 2014-12-09 DIAGNOSIS — E119 Type 2 diabetes mellitus without complications: Secondary | ICD-10-CM | POA: Diagnosis not present

## 2014-12-09 DIAGNOSIS — M4712 Other spondylosis with myelopathy, cervical region: Secondary | ICD-10-CM | POA: Diagnosis not present

## 2014-12-13 DIAGNOSIS — S43422A Sprain of left rotator cuff capsule, initial encounter: Secondary | ICD-10-CM | POA: Diagnosis not present

## 2014-12-16 DIAGNOSIS — M5000 Cervical disc disorder with myelopathy, unspecified cervical region: Secondary | ICD-10-CM | POA: Diagnosis not present

## 2014-12-20 DIAGNOSIS — Z6836 Body mass index (BMI) 36.0-36.9, adult: Secondary | ICD-10-CM | POA: Diagnosis not present

## 2014-12-20 DIAGNOSIS — E1169 Type 2 diabetes mellitus with other specified complication: Secondary | ICD-10-CM | POA: Diagnosis not present

## 2014-12-20 DIAGNOSIS — J441 Chronic obstructive pulmonary disease with (acute) exacerbation: Secondary | ICD-10-CM | POA: Diagnosis not present

## 2015-01-05 DIAGNOSIS — S43422D Sprain of left rotator cuff capsule, subsequent encounter: Secondary | ICD-10-CM | POA: Diagnosis not present

## 2015-01-20 DIAGNOSIS — J441 Chronic obstructive pulmonary disease with (acute) exacerbation: Secondary | ICD-10-CM | POA: Diagnosis not present

## 2015-01-20 DIAGNOSIS — Z6832 Body mass index (BMI) 32.0-32.9, adult: Secondary | ICD-10-CM | POA: Diagnosis not present

## 2015-01-20 DIAGNOSIS — R630 Anorexia: Secondary | ICD-10-CM | POA: Diagnosis not present

## 2015-01-20 DIAGNOSIS — E119 Type 2 diabetes mellitus without complications: Secondary | ICD-10-CM | POA: Diagnosis not present

## 2015-02-21 DIAGNOSIS — Z6836 Body mass index (BMI) 36.0-36.9, adult: Secondary | ICD-10-CM | POA: Diagnosis not present

## 2015-02-21 DIAGNOSIS — J449 Chronic obstructive pulmonary disease, unspecified: Secondary | ICD-10-CM | POA: Diagnosis not present

## 2015-02-21 DIAGNOSIS — E118 Type 2 diabetes mellitus with unspecified complications: Secondary | ICD-10-CM | POA: Diagnosis not present

## 2015-02-21 DIAGNOSIS — J45909 Unspecified asthma, uncomplicated: Secondary | ICD-10-CM | POA: Diagnosis not present

## 2015-03-16 DIAGNOSIS — J9801 Acute bronchospasm: Secondary | ICD-10-CM | POA: Diagnosis not present

## 2015-03-16 DIAGNOSIS — E118 Type 2 diabetes mellitus with unspecified complications: Secondary | ICD-10-CM | POA: Diagnosis not present

## 2015-04-11 DIAGNOSIS — M75122 Complete rotator cuff tear or rupture of left shoulder, not specified as traumatic: Secondary | ICD-10-CM | POA: Diagnosis not present

## 2015-04-11 DIAGNOSIS — M25512 Pain in left shoulder: Secondary | ICD-10-CM | POA: Diagnosis not present

## 2015-04-11 DIAGNOSIS — M25511 Pain in right shoulder: Secondary | ICD-10-CM | POA: Diagnosis not present

## 2015-04-11 DIAGNOSIS — G8929 Other chronic pain: Secondary | ICD-10-CM | POA: Diagnosis not present

## 2015-04-11 DIAGNOSIS — M19012 Primary osteoarthritis, left shoulder: Secondary | ICD-10-CM | POA: Diagnosis not present

## 2015-04-13 DIAGNOSIS — M5416 Radiculopathy, lumbar region: Secondary | ICD-10-CM | POA: Diagnosis not present

## 2015-04-15 ENCOUNTER — Ambulatory Visit (HOSPITAL_COMMUNITY): Payer: Medicare Other | Attending: Physician Assistant

## 2015-04-15 DIAGNOSIS — M25512 Pain in left shoulder: Secondary | ICD-10-CM | POA: Diagnosis not present

## 2015-04-15 DIAGNOSIS — M629 Disorder of muscle, unspecified: Secondary | ICD-10-CM | POA: Diagnosis not present

## 2015-04-15 DIAGNOSIS — M75102 Unspecified rotator cuff tear or rupture of left shoulder, not specified as traumatic: Secondary | ICD-10-CM | POA: Diagnosis not present

## 2015-04-15 DIAGNOSIS — M25612 Stiffness of left shoulder, not elsewhere classified: Secondary | ICD-10-CM | POA: Diagnosis not present

## 2015-04-15 DIAGNOSIS — R29898 Other symptoms and signs involving the musculoskeletal system: Secondary | ICD-10-CM

## 2015-04-15 DIAGNOSIS — M6289 Other specified disorders of muscle: Secondary | ICD-10-CM

## 2015-04-15 NOTE — Patient Instructions (Signed)
*  Complete exercises 2-3 times a day.   1) Seated Row   Sit up straight with elbows by your sides. Pull back with shoulders/elbows, keeping forearms straight, as if pulling back on the reins of a horse. Squeeze shoulder blades together. Repeat _10__times, ____sets/day    2) Shoulder Elevation    Sit up straight with arms by your sides. Slowly bring your shoulders up towards your ears. Repeat_10__times, ____ sets/day    3) Shoulder Extension    Sit up straight with both arms by your side, draw your arms back behind your waist. Keep your elbows straight. Repeat _10___times, ____sets/day.    *Complete the table slides for 1-3 minutes each.  SHOULDER: Flexion On Table   Place hands on table, elbows straight. Move hips away from body. Press hands down into table.   Abduction (Passive)   With arm out to side, resting on table, lower head toward arm, keeping trunk away from table.  Copyright  VHI. All rights reserved.     Internal Rotation (Assistive)   Seated with elbow bent at right angle and held against side, slide arm on table surface in an inward arc.  Activity: Use this motion to brush crumbs off the table.  Copyright  VHI. All rights reserved.

## 2015-04-15 NOTE — Therapy (Signed)
[place], [place], [address] Phone: [phone]   Fax:  [phone]  Occupational Therapy Evaluation  Patient Details  Name: Debbie Mullins MRN: [id number] Date of Birth: May 23, 1955 Referring Provider: Lynda Rainwater  Encounter Date: 04/15/2015      OT End of Session - 04/15/15 1227    Visit Number 1   Number of Visits 16   Date for OT Re-Evaluation 06/14/15  mini reassess: 05/13/15   Authorization Type [employer] Medicare PPO - $40.00 copay   Authorization Time Period before 10th visit   Authorization - Visit Number 1   Authorization - Number of Visits 10   OT Start Time 0930   OT Stop Time 1015   OT Time Calculation (min) 45 min   Activity Tolerance Patient tolerated treatment well   Behavior During Therapy [facility] for tasks assessed/performed      Past Medical History  Diagnosis Date  . Anemia   . Leukocytosis 02/09/2011  . Normocytic anemia 02/09/2011  . Thrombocytosis 02/09/2011  . COPD (chronic obstructive pulmonary disease)     Albuterol inhaler prn;SIngulair at night  . Depression     takes Wellbutrin daily  . GERD (gastroesophageal reflux disease)     takes Nexium daily  . Hypertension     takes Lisinopril daily  . Diabetes mellitus     takes Metformin daily  . Asthma   . Pneumonia     many yrs ago  . History of colon polyps     benign  . Urinary frequency   . Urinary urgency   . Peripheral edema     takes Furosemide daily  . CHF (congestive heart failure)     takes Furosemide daily   . Pulmonary nodules/lesions, multiple 08/09/2013  . Shortness of breath dyspnea     with exertion  . Weakness     numbness and tingling in both hands  . Joint pain   . Joint swelling   . Neck pain     HNP    Past Surgical History  Procedure Laterality Date  . Total knee arthroplasty Bilateral   . Tonsillectomy      as a child  . Colonoscopy    . Knee arthroplasty Left 08/10/2013    Procedure:   TOTAL KNEE ARTHROPLASTY;  Surgeon: [name], MD;  Location: [place];  Service: Orthopedics;  Laterality: Left;  Left Total Knee Arthroplasty Revision, Cemented, Semi-constrained,   . Left and right heart catheterization with coronary angiogram N/A 01/07/2012    Procedure: LEFT AND RIGHT HEART CATHETERIZATION WITH CORONARY ANGIOGRAM;  Surgeon: Thayer Headings, MD;  Location: [facility] CATH LAB;  Service: Cardiovascular;  Laterality: N/A;  . Anterior cervical decomp/discectomy fusion N/A 11/17/2014    Procedure: Cervical four- cervical five Anterior Cervical Decompression with fusion and bonegraft;  Surgeon: Ashok Pall, MD;  Location: [place] NEURO ORS;  Service: Neurosurgery;  Laterality: N/A;  C45 anterior cervical decompression with fusion plating and bonegraft    There were no vitals filed for this visit.  Visit Diagnosis:  Rotator cuff tear, left - Plan: Ot plan of care cert/re-cert  Pain in joint of left shoulder - Plan: Ot plan of care cert/re-cert  Shoulder weakness - Plan: Ot plan of care cert/re-cert  Shoulder stiffness, left - Plan: Ot plan of care cert/re-cert  Tight fascia - Plan: Ot plan of care cert/re-cert      Subjective Assessment - 04/15/15 1220    Subjective  S:  There's so much going on with me. I just have to fix one thing at a time.    Pertinent History Patient is a 60 y/o female S/P left rotator cuff tear which occured approximately 6 months ago. Pt reports that she had several falls occur which may have caused the tear. MRI has confirmed the tear per patient. Pt states that she underwent a neck fusion which was supposed to help her shoulder although it did not. Dr. Geronimo Running has referred patient to occupational therapy for evaluation and treatment.    Special Tests FOTO score: 39/100   Patient Stated Goals To increase use of left arm and hopefully not have to undergo surgery.   Currently in Pain? No/denies  Pt reports that she has taken a pain med prior to eval. Pain level is 5/10  at worse.            [facility] OT Assessment - 04/15/15 0931    Assessment   Diagnosis Left rotator cuff tear   Referring Provider Lynda Rainwater   Onset Date --  August 2016   Prior Therapy None   Precautions   Precautions None   Restrictions   Weight Bearing Restrictions No   Balance Screen   Has the patient fallen in the past 6 months No   Home  Environment   Family/patient expects to be discharged to: Private residence   Additional Comments Shares a house with cousin   Lives With Family   Prior Function   Level of Independence Independent with household mobility with device;Independent with community mobility with device   Vocation On disability   ADL   ADL comments Difficulty pulling pants up, washing her back, reaching above shoulder level   Mobility   Mobility Status History of falls   Written Expression   Dominant Hand Right   Vision - History   Baseline Vision No visual deficits   Cognition   Overall Cognitive Status Within Functional Limits for tasks assessed   ROM / Strength   AROM / PROM / Strength AROM;PROM;Strength   Palpation   Palpation comment Mod fascial restrictions in left upper arm, trapezius, and scapularis region.    AROM   Overall AROM Comments Assessed supine. IR/er adducted   AROM Assessment Site Shoulder   Right/Left Shoulder Left   Left Shoulder Flexion 65 Degrees   Left Shoulder ABduction 75 Degrees   Left Shoulder Internal Rotation 90 Degrees   Left Shoulder External Rotation 80 Degrees   PROM   Overall PROM Comments Assessed supine. IR/ adducted   PROM Assessment Site Shoulder   Right/Left Shoulder Left   Left Shoulder Flexion 152 Degrees   Left Shoulder ABduction 141 Degrees   Left Shoulder Internal Rotation 90 Degrees   Left Shoulder External Rotation 90 Degrees   Strength   Overall Strength Comments Assessed seated. IR/er adducted   Strength Assessment Site Shoulder   Right/Left Shoulder Left   Left Shoulder Flexion 3-/5    Left Shoulder ABduction 3-/5   Left Shoulder Internal Rotation 3/5   Left Shoulder External Rotation 3-/5                         OT Education - 04/15/15 1226    Education provided Yes   Education Details table slides and scapular A/ROM exercises   Person(s) Educated Patient   Methods Explanation;Demonstration;Verbal cues;Handout   Comprehension Returned demonstration;Verbalized understanding          OT Short Term  Goals - 05/12/15 1231    OT SHORT TERM GOAL #1   Title Pt will be educated and independent with HEP to increase functional use of LUE during daily tasks.    Time 4   Period Weeks   Status New   OT SHORT TERM GOAL #2   Title Patient will increase P/ROM to WNL to increase ability to complete reaching activities below shoulder height.    Time 4   Period Weeks   Status New   OT SHORT TERM GOAL #3   Title Patient will increase LUE strength to 3+/5 to increase ability to pull pants up over hips.    Time 4   Period Weeks   Status New   OT SHORT TERM GOAL #4   Title Patient will decrease fascial restrictions to min amount to increase functional mobility in LUE.    Time 4   Period Weeks   Status New   OT SHORT TERM GOAL #5   Title Patient will decrease pain level to 3/10 or less when using LUE during daily tasks.    Time 4   Period Weeks   Status New           OT Long Term Goals - 05/12/2015 1235    OT LONG TERM GOAL #1   Title Patient will return to highest level of independence with LUE during daily tasks.    Time 8   Period Weeks   Status New   OT LONG TERM GOAL #2   Title Patient will increase A/ROM to [facility] to increase ability to use LUE more during daily tasks.    Time 8   Period Weeks   Status New   OT LONG TERM GOAL #3   Title Patient will increase LUE strength to 4/5 to increase ability to complete lightweight household tasks.    Time 8   Period Weeks   Status New   OT LONG TERM GOAL #4   Title Patient will decrease fascial  restrictions to trace amount in LUE to increase functional mobility during daily tasks.    Time 8   Period Weeks   Status New   OT LONG TERM GOAL #5   Title Patient will decrease pain level in LUE to 1/10 or less with use during daily tasks.    Time 8   Period Weeks   Status New               Plan - 05-12-2015 1228    Clinical Impression Statement A: Patient is a 60 y/o female S/P left rotator cuff tear that causes increased pain and fascial restrictions and decreased strength and ROM resulting in difficulty completing daily tasks using LUE.    Pt will benefit from skilled therapeutic intervention in order to improve on the following deficits (Retired) Decreased strength;Pain;Decreased range of motion;Increased fascial restricitons   Rehab Potential Good   OT Frequency 2x / week   OT Duration 8 weeks   OT Treatment/Interventions Self-care/ADL training;Ultrasound;Passive range of motion;Patient/family education;Cryotherapy;Electrical Stimulation;Moist Heat;Therapeutic activities;Therapeutic exercises;Manual Therapy   Plan P: Skilled OT services required to increase functional performance during daily tasks using LUE. Treatment Plan: myofascial release, manual stretching, attempt AA/ROM next session, progress to A/ROM and shoulder and scapular strengthening.    Consulted and Agree with Plan of Care Patient          G-Codes - 05/12/2015 1238    Functional Assessment Tool Used FOTO score: 39/100 (61% impaired)   Functional Limitation Carrying,  moving and handling objects   Carrying, Moving and Handling Objects Current Status [phone]) At least 21 percent but less than 80 percent impaired, limited or restricted   Carrying, Moving and Handling Objects Goal Status [id number]) At least 20 percent but less than 40 percent impaired, limited or restricted      Problem List Patient Active Problem List   Diagnosis Date Noted  . Osteoarthritis of spine with myelopathy, cervical region 11/17/2014   . Failed total left knee replacement ([place]) 08/10/2013  . Pulmonary nodules/lesions, multiple 08/09/2013  . HLD (hyperlipidemia) 01/22/2012  . Pulmonary hypertension ([place]) 01/08/2012  . Acute on chronic diastolic heart failure ([place]) 01/05/2012  . Acute respiratory failure with hypoxia ([place]) 01/03/2012  . Pulmonary edema, acute ([place]) 01/03/2012  . 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 HTN (hypertension) 01/02/2012  . COPD with acute exacerbation ([place]) 01/02/2012  . Tobacco abuse 07/04/2011  . 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 Leukocytosis 02/09/2011  . Normocytic anemia 02/09/2011  . Mild Thrombocytosis 02/09/2011  . COPD (chronic obstructive pulmonary disease) ([place]) 02/09/2011  . Obesity 02/09/2011    Ailene Ravel, OTR/L,CBIS  [phone]  04/15/2015, 12:41 PM  [place] [address] [place], [place], [address] Phone: [phone]   Fax:  [phone]  Name: Debbie Mullins MRN: [id number] Date of Birth: [birth year]

## 2015-04-18 ENCOUNTER — Ambulatory Visit (HOSPITAL_COMMUNITY): Payer: Medicare Other | Admitting: Specialist

## 2015-04-18 DIAGNOSIS — R29898 Other symptoms and signs involving the musculoskeletal system: Secondary | ICD-10-CM

## 2015-04-18 DIAGNOSIS — M25512 Pain in left shoulder: Secondary | ICD-10-CM | POA: Diagnosis not present

## 2015-04-18 DIAGNOSIS — M75102 Unspecified rotator cuff tear or rupture of left shoulder, not specified as traumatic: Secondary | ICD-10-CM | POA: Diagnosis not present

## 2015-04-18 DIAGNOSIS — M629 Disorder of muscle, unspecified: Secondary | ICD-10-CM | POA: Diagnosis not present

## 2015-04-18 DIAGNOSIS — M25612 Stiffness of left shoulder, not elsewhere classified: Secondary | ICD-10-CM | POA: Diagnosis not present

## 2015-04-18 NOTE — Therapy (Signed)
[place], [place], [address] Phone: [phone]   Fax:  [phone]  Occupational Therapy Treatment  Patient Details  Name: Debbie Mullins MRN: [id number] Date of Birth: [birth year] Referring Provider: Dr. Lynda Rainwater  Encounter Date: 04/18/2015      OT End of Session - 04/18/15 1554    Visit Number 2   Number of Visits 16   Date for OT Re-Evaluation 06/14/15  mini reassess on 05/13/15   Authorization Type [employer] Medicare PPO - $40.00 copay   Authorization Time Period before 10th visit   Authorization - Visit Number 2   Authorization - Number of Visits 10   OT Start Time [id number]   OT Stop Time 1518   OT Time Calculation (min) 39 min   Activity Tolerance Patient tolerated treatment well   Behavior During Therapy [facility] for tasks assessed/performed      Past Medical History  Diagnosis Date  . Anemia   . Leukocytosis 02/09/2011  . Normocytic anemia 02/09/2011  . Thrombocytosis 02/09/2011  . COPD (chronic obstructive pulmonary disease)     Albuterol inhaler prn;SIngulair at night  . Depression     takes Wellbutrin daily  . GERD (gastroesophageal reflux disease)     takes Nexium daily  . Hypertension     takes Lisinopril daily  . Diabetes mellitus     takes Metformin daily  . Asthma   . Pneumonia     many yrs ago  . History of colon polyps     benign  . Urinary frequency   . Urinary urgency   . Peripheral edema     takes Furosemide daily  . CHF (congestive heart failure)     takes Furosemide daily   . Pulmonary nodules/lesions, multiple 08/09/2013  . Shortness of breath dyspnea     with exertion  . Weakness     numbness and tingling in both hands  . Joint pain   . Joint swelling   . Neck pain     HNP    Past Surgical History  Procedure Laterality Date  . Total knee arthroplasty Bilateral   . Tonsillectomy      as a child  . Colonoscopy    . Knee arthroplasty Left 08/10/2013    Procedure:   TOTAL KNEE ARTHROPLASTY;  Surgeon: [name], MD;  Location: [place];  Service: Orthopedics;  Laterality: Left;  Left Total Knee Arthroplasty Revision, Cemented, Semi-constrained,   . Left and right heart catheterization with coronary angiogram N/A 01/07/2012    Procedure: LEFT AND RIGHT HEART CATHETERIZATION WITH CORONARY ANGIOGRAM;  Surgeon: Thayer Headings, MD;  Location: [facility] CATH LAB;  Service: Cardiovascular;  Laterality: N/A;  . Anterior cervical decomp/discectomy fusion N/A 11/17/2014    Procedure: Cervical four- cervical five Anterior Cervical Decompression with fusion and bonegraft;  Surgeon: Ashok Pall, MD;  Location: [place] NEURO ORS;  Service: Neurosurgery;  Laterality: N/A;  C45 anterior cervical decompression with fusion plating and bonegraft    There were no vitals filed for this visit.  Visit Diagnosis:  Rotator cuff tear, left  Pain in joint of left shoulder  Shoulder weakness      Subjective Assessment - 04/18/15 1554    Subjective  S:  I am trying to get my rotator cuff stronger so that I dont need to have surgery   Currently in Pain? No/denies            [facility] OT Assessment - 04/18/15  0001    Assessment   Diagnosis Left rotator cuff tear   Referring Provider Dr. Lynda Rainwater   Prior Therapy None   Precautions   Precautions None                  OT Treatments/Exercises (OP) - 04/18/15 0001    Exercises   Exercises Shoulder   Shoulder Exercises: Supine   Protraction PROM;AAROM;10 reps   Horizontal ABduction PROM;AAROM;10 reps   External Rotation PROM;10 reps;AAROM   Internal Rotation PROM;AAROM;10 reps   Flexion PROM;AAROM;10 reps   ABduction PROM;AAROM;10 reps   ABduction Limitations with elbow bent to 90 and only to 90 degrees of abduction   Shoulder Exercises: Seated   Elevation AROM;10 reps   Extension AROM;10 reps   Row AROM;10 reps   Shoulder Exercises: Therapy Ball   Flexion 15 reps   ABduction 15 reps   ABduction Limitations  therapist provided assistance to maintain positioning of the therapy ball while patient was stretching into abduction   Shoulder Exercises: Isometric Strengthening   Flexion Supine;3X3"   Extension Supine;3X3"   External Rotation Supine;3X3"   Internal Rotation Supine;3X3"   ABduction Supine;3X3"   ADduction Supine;3X3"   Manual Therapy   Manual Therapy Myofascial release   Manual therapy comments manual therapy completed seperately from all other interventions this date   Myofascial Release myofascial release and manual stretching to left upper arm, scapular, and shoulder region to decrease pain and restrictions and improve pain free mobility in her left shoulder region                OT Education - 04/18/15 1553    Education provided Yes   Education Details issued plan of care and goals to patient.  reviewed treatment goals with patient and she is agreeance that these are the goals that she desires to be working on.    Person(s) Educated Patient   Methods Explanation;Handout   Comprehension Verbalized understanding          OT Short Term Goals - 04/18/15 1558    OT SHORT TERM GOAL #1   Title Pt will be educated and independent with HEP to increase functional use of LUE during daily tasks.    Time 4   Period Weeks   Status On-going   OT SHORT TERM GOAL #2   Title Patient will increase P/ROM to WNL to increase ability to complete reaching activities below shoulder height.    Time 4   Period Weeks   Status On-going   OT SHORT TERM GOAL #3   Title Patient will increase LUE strength to 3+/5 to increase ability to pull pants up over hips.    Time 4   Period Weeks   Status On-going   OT SHORT TERM GOAL #4   Title Patient will decrease fascial restrictions to min amount to increase functional mobility in LUE.    Time 4   Period Weeks   Status On-going   OT SHORT TERM GOAL #5   Title Patient will decrease pain level to 3/10 or less when using LUE during daily tasks.     Time 4   Period Weeks   Status On-going           OT Long Term Goals - 04/18/15 1559    OT LONG TERM GOAL #1   Title Patient will return to highest level of independence with LUE during daily tasks.    Time 8   Period Weeks  Status On-going   OT LONG TERM GOAL #2   Title Patient will increase A/ROM to [facility] to increase ability to use LUE more during daily tasks.    Time 8   Period Weeks   Status On-going   OT LONG TERM GOAL #3   Title Patient will increase LUE strength to 4/5 to increase ability to complete lightweight household tasks.    Time 8   Period Weeks   Status On-going   OT LONG TERM GOAL #4   Title Patient will decrease fascial restrictions to trace amount in LUE to increase functional mobility during daily tasks.    Time 8   Period Weeks   Status On-going   OT LONG TERM GOAL #5   Title Patient will decrease pain level in LUE to 1/10 or less with use during daily tasks.    Time 8   Period Weeks   Status On-going               Plan - 04/18/15 1556    Clinical Impression Statement A:  Patient demonstrated [facility] P/ROM this date in supine of all left shoulder ranges.  Inititated AA/ROM in supine with occasional CGA from therapist and moderate verbal guidance to control her left arm when moving from flexion to neutral.  Patient tends to let her arm flop down vs controlling the movement.    Plan P:  Increase control of AA/ROM from flexion to neutral with less verbal guidance from therapist.  Add rhythmic stabization exercises to increase strength in proximal shoulder muscles needed to improve independence with pulling her pants up.          Problem List Patient Active Problem List   Diagnosis Date Noted  . Osteoarthritis of spine with myelopathy, cervical region 11/17/2014  . Failed total left knee replacement ([place]) 08/10/2013  . Pulmonary nodules/lesions, multiple 08/09/2013  . HLD (hyperlipidemia) 01/22/2012  . Pulmonary hypertension ([place]) 01/08/2012   . Acute on chronic diastolic heart failure ([place]) 01/05/2012  . Acute respiratory failure with hypoxia ([place]) 01/03/2012  . Pulmonary edema, acute ([place]) 01/03/2012  . 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 HTN (hypertension) 01/02/2012  . COPD with acute exacerbation ([place]) 01/02/2012  . Tobacco abuse 07/04/2011  . 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 Leukocytosis 02/09/2011  . Normocytic anemia 02/09/2011  . Mild Thrombocytosis 02/09/2011  . COPD (chronic obstructive pulmonary disease) ([place]) 02/09/2011  . Obesity 02/09/2011    Vangie Bicker, OTR/L [phone]  04/18/2015, 4:00 PM  [place] [address] [place], [place], [address] Phone: [phone]   Fax:  [phone]  Name: Debbie Mullins MRN: [id number] Date of Birth: Oct 12, 1955

## 2015-04-20 DIAGNOSIS — I10 Essential (primary) hypertension: Secondary | ICD-10-CM | POA: Diagnosis not present

## 2015-04-22 ENCOUNTER — Ambulatory Visit (HOSPITAL_COMMUNITY): Payer: Medicare Other | Admitting: Specialist

## 2015-04-22 DIAGNOSIS — M75102 Unspecified rotator cuff tear or rupture of left shoulder, not specified as traumatic: Secondary | ICD-10-CM | POA: Diagnosis not present

## 2015-04-22 DIAGNOSIS — M25512 Pain in left shoulder: Secondary | ICD-10-CM | POA: Diagnosis not present

## 2015-04-22 DIAGNOSIS — R29898 Other symptoms and signs involving the musculoskeletal system: Secondary | ICD-10-CM | POA: Diagnosis not present

## 2015-04-22 DIAGNOSIS — M629 Disorder of muscle, unspecified: Secondary | ICD-10-CM | POA: Diagnosis not present

## 2015-04-22 DIAGNOSIS — M25612 Stiffness of left shoulder, not elsewhere classified: Secondary | ICD-10-CM | POA: Diagnosis not present

## 2015-04-22 NOTE — Therapy (Signed)
[place], [place], [address] Phone: [phone]   Fax:  [phone]  Occupational Therapy Treatment  Patient Details  Name: Debbie Mullins MRN: [id number] Date of Birth: 11-Jan-1956 Referring Provider: Dr. Lynda Rainwater  Encounter Date: 04/22/2015      OT End of Session - 04/22/15 1319    Visit Number 3   Number of Visits 16   Date for OT Re-Evaluation 06/14/15  mini reassess on 05/13/15   Authorization Type U[employer] Medicare PPO - $40.00 copay   Authorization Time Period before 10th visit   Authorization - Visit Number 3   Authorization - Number of Visits 10   OT Start Time [phone]   OT Stop Time 1040   OT Time Calculation (min) 45 min   Activity Tolerance Patient tolerated treatment well   Behavior During Therapy [facility] for tasks assessed/performed      Past Medical History  Diagnosis Date  . Anemia   . Leukocytosis 02/09/2011  . Normocytic anemia 02/09/2011  . Thrombocytosis 02/09/2011  . COPD (chronic obstructive pulmonary disease)     Albuterol inhaler prn;SIngulair at night  . Depression     takes Wellbutrin daily  . GERD (gastroesophageal reflux disease)     takes Nexium daily  . Hypertension     takes Lisinopril daily  . Diabetes mellitus     takes Metformin daily  . Asthma   . Pneumonia     many yrs ago  . History of colon polyps     benign  . Urinary frequency   . Urinary urgency   . Peripheral edema     takes Furosemide daily  . CHF (congestive heart failure)     takes Furosemide daily   . Pulmonary nodules/lesions, multiple 08/09/2013  . Shortness of breath dyspnea     with exertion  . Weakness     numbness and tingling in both hands  . Joint pain   . Joint swelling   . Neck pain     HNP    Past Surgical History  Procedure Laterality Date  . Total knee arthroplasty Bilateral   . Tonsillectomy      as a child  . Colonoscopy    . Knee arthroplasty Left 08/10/2013     Procedure:  TOTAL KNEE ARTHROPLASTY;  Surgeon: [name], MD;  Location: [place];  Service: Orthopedics;  Laterality: Left;  Left Total Knee Arthroplasty Revision, Cemented, Semi-constrained,   . Left and right heart catheterization with coronary angiogram N/A 01/07/2012    Procedure: LEFT AND RIGHT HEART CATHETERIZATION WITH CORONARY ANGIOGRAM;  Surgeon: Thayer Headings, MD;  Location: [facility] CATH LAB;  Service: Cardiovascular;  Laterality: N/A;  . Anterior cervical decomp/discectomy fusion N/A 11/17/2014    Procedure: Cervical four- cervical five Anterior Cervical Decompression with fusion and bonegraft;  Surgeon: Ashok Pall, MD;  Location: [place] NEURO ORS;  Service: Neurosurgery;  Laterality: N/A;  C45 anterior cervical decompression with fusion plating and bonegraft    There were no vitals filed for this visit.  Visit Diagnosis:  Rotator cuff tear, left  Pain in joint of left shoulder  Shoulder weakness  Shoulder stiffness, left      Subjective Assessment - 04/22/15 1317    Subjective  S:  I can tell it's getting better.   Currently in Pain? No/denies            [facility] OT Assessment - 04/22/15 0001    Assessment  Diagnosis Left rotator cuff tear   Prior Therapy None   Precautions   Precautions None                  OT Treatments/Exercises (OP) - 04/22/15 0001    Exercises   Exercises Shoulder   Shoulder Exercises: Supine   Protraction PROM;AAROM;10 reps   Horizontal ABduction PROM;AAROM;10 reps   External Rotation PROM;10 reps;AAROM   Internal Rotation PROM;AAROM;10 reps   Flexion PROM;AAROM;10 reps   ABduction PROM;AAROM;10 reps   ABduction Limitations with elbow bent to 90 and only to 90 degrees of abduction   Shoulder Exercises: Seated   Elevation AROM;15 reps   Extension AROM;15 reps   Row AROM;15 reps   Shoulder Exercises: Pulleys   Flexion 1 minute   ABduction 1 minute   ABduction Limitations with therapist facilitating correct movement   Shoulder  Exercises: Therapy Ball   Flexion 15 reps   ABduction 15 reps   ABduction Limitations therapist provided assistance to maintain positioning of the therapy ball while patient was stretching into abduction   Shoulder Exercises: Isometric Strengthening   Flexion Supine;5X10"   Extension Supine;5X10"   External Rotation Supine;5X10"   Internal Rotation Supine;5X10"   ABduction Supine;5X10"   ADduction Supine;5X10"   Manual Therapy   Manual Therapy Myofascial release   Manual therapy comments manual therapy completed seperately from all other interventions this date   Myofascial Release myofascial release and manual stretching to left upper arm, scapular, and shoulder region to decrease pain and restrictions and improve pain free mobility in her left shoulder region                  OT Short Term Goals - 04/18/15 1558    OT SHORT TERM GOAL #1   Title Pt will be educated and independent with HEP to increase functional use of LUE during daily tasks.    Time 4   Period Weeks   Status On-going   OT SHORT TERM GOAL #2   Title Patient will increase P/ROM to WNL to increase ability to complete reaching activities below shoulder height.    Time 4   Period Weeks   Status On-going   OT SHORT TERM GOAL #3   Title Patient will increase LUE strength to 3+/5 to increase ability to pull pants up over hips.    Time 4   Period Weeks   Status On-going   OT SHORT TERM GOAL #4   Title Patient will decrease fascial restrictions to min amount to increase functional mobility in LUE.    Time 4   Period Weeks   Status On-going   OT SHORT TERM GOAL #5   Title Patient will decrease pain level to 3/10 or less when using LUE during daily tasks.    Time 4   Period Weeks   Status On-going           OT Long Term Goals - 04/18/15 1559    OT LONG TERM GOAL #1   Title Patient will return to highest level of independence with LUE during daily tasks.    Time 8   Period Weeks   Status On-going    OT LONG TERM GOAL #2   Title Patient will increase A/ROM to [facility] to increase ability to use LUE more during daily tasks.    Time 8   Period Weeks   Status On-going   OT LONG TERM GOAL #3   Title Patient will increase LUE strength to  4/5 to increase ability to complete lightweight household tasks.    Time 8   Period Weeks   Status On-going   OT LONG TERM GOAL #4   Title Patient will decrease fascial restrictions to trace amount in LUE to increase functional mobility during daily tasks.    Time 8   Period Weeks   Status On-going   OT LONG TERM GOAL #5   Title Patient will decrease pain level in LUE to 1/10 or less with use during daily tasks.    Time 8   Period Weeks   Status On-going               Plan - 04/22/15 1319    Clinical Impression Statement A:  Patient able to transfer from sit to stand independently this date, using her left arm as assist.  Increased control of LUE AA/ROM in supine, however , continues to have difficulty controling descending movements without max vg and facilitation from therapist.   Attempted rhythmic stabilization in supine and patient was unable to maintain arm postiion without max assist.     Plan P:  Attempt box and block activity to improve functional reaching with her right arm needed for greater independence with activities at home.          Problem List Patient Active Problem List   Diagnosis Date Noted  . Osteoarthritis of spine with myelopathy, cervical region 11/17/2014  . Failed total left knee replacement ([place]) 08/10/2013  . Pulmonary nodules/lesions, multiple 08/09/2013  . HLD (hyperlipidemia) 01/22/2012  . Pulmonary hypertension ([place]) 01/08/2012  . Acute on chronic diastolic heart failure ([place]) 01/05/2012  . Acute respiratory failure with hypoxia ([place]) 01/03/2012  . Pulmonary edema, acute ([place]) 01/03/2012  . 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 HTN (hypertension) 01/02/2012  . COPD with acute exacerbation  ([place]) 01/02/2012  . Tobacco abuse 07/04/2011  . 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 Leukocytosis 02/09/2011  . Normocytic anemia 02/09/2011  . Mild Thrombocytosis 02/09/2011  . COPD (chronic obstructive pulmonary disease) ([place]) 02/09/2011  . Obesity 02/09/2011    Vangie Bicker, OTR/L [phone]  04/22/2015, 1:23 PM  [place] [address] [place], [place], [address] Phone: [phone]   Fax:  [phone]  Name: ALEXIA BYER MRN: [id number] Date of Birth: [birth year]

## 2015-04-26 ENCOUNTER — Ambulatory Visit (HOSPITAL_COMMUNITY): Payer: Medicare Other

## 2015-04-26 ENCOUNTER — Encounter (HOSPITAL_COMMUNITY): Payer: Self-pay

## 2015-04-26 DIAGNOSIS — R29898 Other symptoms and signs involving the musculoskeletal system: Secondary | ICD-10-CM | POA: Diagnosis not present

## 2015-04-26 DIAGNOSIS — M25612 Stiffness of left shoulder, not elsewhere classified: Secondary | ICD-10-CM

## 2015-04-26 DIAGNOSIS — M629 Disorder of muscle, unspecified: Secondary | ICD-10-CM

## 2015-04-26 DIAGNOSIS — M6289 Other specified disorders of muscle: Secondary | ICD-10-CM

## 2015-04-26 DIAGNOSIS — M25512 Pain in left shoulder: Secondary | ICD-10-CM | POA: Diagnosis not present

## 2015-04-26 DIAGNOSIS — M75102 Unspecified rotator cuff tear or rupture of left shoulder, not specified as traumatic: Secondary | ICD-10-CM | POA: Diagnosis not present

## 2015-04-26 NOTE — Therapy (Signed)
[place], [place], [address] Phone: [phone]   Fax:  [phone]  Occupational Therapy Treatment  Patient Details  Name: Debbie Mullins MRN: [id number] Date of Birth: 1956-01-10 Referring Provider: Dr. Lynda Rainwater  Encounter Date: 04/26/2015      OT End of Session - 04/26/15 1529    Visit Number 4   Number of Visits 16   Date for OT Re-Evaluation 06/14/15  mini reassess on 05/13/15   Authorization Type [employer] Medicare PPO - $40.00 copay   Authorization Time Period before 10th visit   Authorization - Visit Number 4   Authorization - Number of Visits 10   OT Start Time 1435   OT Stop Time 1515   OT Time Calculation (min) 40 min   Activity Tolerance Patient tolerated treatment well   Behavior During Therapy [facility] for tasks assessed/performed      Past Medical History  Diagnosis Date  . Anemia   . Leukocytosis 02/09/2011  . Normocytic anemia 02/09/2011  . Thrombocytosis (Debbie Mullins) 02/09/2011  . COPD (chronic obstructive pulmonary disease) (HCC)     Albuterol inhaler prn;SIngulair at night  . Depression     takes Wellbutrin daily  . GERD (gastroesophageal reflux disease)     takes Nexium daily  . Hypertension     takes Lisinopril daily  . Diabetes mellitus     takes Metformin daily  . Asthma   . Pneumonia     many yrs ago  . History of colon polyps     benign  . Urinary frequency   . Urinary urgency   . Peripheral edema     takes Furosemide daily  . CHF (congestive heart failure) (HCC)     takes Furosemide daily   . Pulmonary nodules/lesions, multiple 08/09/2013  . Shortness of breath dyspnea     with exertion  . Weakness     numbness and tingling in both hands  . Joint pain   . Joint swelling   . Neck pain     HNP    Past Surgical History  Procedure Laterality Date  . Total knee arthroplasty Bilateral   . Tonsillectomy      as a child  . Colonoscopy    . Knee arthroplasty Left  08/10/2013    Procedure:  TOTAL KNEE ARTHROPLASTY;  Surgeon: [name], MD;  Location: [place];  Service: Orthopedics;  Laterality: Left;  Left Total Knee Arthroplasty Revision, Cemented, Semi-constrained,   . Left and right heart catheterization with coronary angiogram N/A 01/07/2012    Procedure: LEFT AND RIGHT HEART CATHETERIZATION WITH CORONARY ANGIOGRAM;  Surgeon: Thayer Headings, MD;  Location: [facility] CATH LAB;  Service: Cardiovascular;  Laterality: N/A;  . Anterior cervical decomp/discectomy fusion N/A 11/17/2014    Procedure: Cervical four- cervical five Anterior Cervical Decompression with fusion and bonegraft;  Surgeon: Ashok Pall, MD;  Location: [place] NEURO ORS;  Service: Neurosurgery;  Laterality: N/A;  C45 anterior cervical decompression with fusion plating and bonegraft    There were no vitals filed for this visit.  Visit Diagnosis:  Shoulder weakness  Shoulder stiffness, left  Tight fascia      Subjective Assessment - 04/26/15 1448    Subjective  S: I just don't want to have sugery.    Currently in Pain? No/denies            [facility] OT Assessment - 04/26/15 1449    Assessment   Diagnosis Left rotator cuff tear  Precautions   Precautions None   Coordination   Box and Blocks Left: 16 blocks in 1 minute                  OT Treatments/Exercises (OP) - 04/26/15 1449    Exercises   Exercises Shoulder   Shoulder Exercises: Supine   Protraction PROM;AAROM;10 reps   Horizontal ABduction PROM;AAROM;10 reps   External Rotation PROM;10 reps;AAROM   Internal Rotation PROM;AAROM;10 reps   Flexion PROM;AAROM;10 reps   ABduction PROM;AAROM;10 reps   Shoulder Exercises: Seated   Protraction AAROM;5 reps   External Rotation AAROM;5 reps   Internal Rotation AAROM;5 reps   Flexion AAROM;5 reps;Limitations   Abduction AAROM;5 reps;Limitations   Shoulder Exercises: Therapy Ball   Flexion 15 reps   ABduction 15 reps   Manual Therapy   Manual Therapy Myofascial  release;Muscle Energy Technique   Manual therapy comments manual therapy completed seperately from all other interventions this date   Myofascial Release myofascial release and manual stretching to left upper arm, scapular, and shoulder region to decrease pain and restrictions and improve pain free mobility in her left shoulder region   Muscle Energy Technique Muscle energy technique to right medial deltoid to decrease muscle spasm and increase joint ROM.                   OT Short Term Goals - 04/18/15 1558    OT SHORT TERM GOAL #1   Title Pt will be educated and independent with HEP to increase functional use of LUE during daily tasks.    Time 4   Period Weeks   Status On-going   OT SHORT TERM GOAL #2   Title Patient will increase P/ROM to WNL to increase ability to complete reaching activities below shoulder height.    Time 4   Period Weeks   Status On-going   OT SHORT TERM GOAL #3   Title Patient will increase LUE strength to 3+/5 to increase ability to pull pants up over hips.    Time 4   Period Weeks   Status On-going   OT SHORT TERM GOAL #4   Title Patient will decrease fascial restrictions to min amount to increase functional mobility in LUE.    Time 4   Period Weeks   Status On-going   OT SHORT TERM GOAL #5   Title Patient will decrease pain level to 3/10 or less when using LUE during daily tasks.    Time 4   Period Weeks   Status On-going           OT Long Term Goals - 04/18/15 1559    OT LONG TERM GOAL #1   Title Patient will return to highest level of independence with LUE during daily tasks.    Time 8   Period Weeks   Status On-going   OT LONG TERM GOAL #2   Title Patient will increase A/ROM to [facility] to increase ability to use LUE more during daily tasks.    Time 8   Period Weeks   Status On-going   OT LONG TERM GOAL #3   Title Patient will increase LUE strength to 4/5 to increase ability to complete lightweight household tasks.    Time 8    Period Weeks   Status On-going   OT LONG TERM GOAL #4   Title Patient will decrease fascial restrictions to trace amount in LUE to increase functional mobility during daily tasks.    Time 8  Period Weeks   Status On-going   OT LONG TERM GOAL #5   Title Patient will decrease pain level in LUE to 1/10 or less with use during daily tasks.    Time 8   Period Weeks   Status On-going               Plan - 04/26/15 1532    Clinical Impression Statement A; Completed portions of AA/ROM seated. Pt very lmited only able to complete 10-20% range. Completed box and blocks with increased difficulty.    Plan P: Continue with pulleys.         Problem List Patient Active Problem List   Diagnosis Date Noted  . Osteoarthritis of spine with myelopathy, cervical region 11/17/2014  . Failed total left knee replacement ([place]) 08/10/2013  . Pulmonary nodules/lesions, multiple 08/09/2013  . HLD (hyperlipidemia) 01/22/2012  . Pulmonary hypertension ([place]) 01/08/2012  . Acute on chronic diastolic heart failure ([place]) 01/05/2012  . Acute respiratory failure with hypoxia ([place]) 01/03/2012  . Pulmonary edema, acute ([place]) 01/03/2012  . 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 HTN (hypertension) 01/02/2012  . COPD with acute exacerbation ([place]) 01/02/2012  . Tobacco abuse 07/04/2011  . 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 Leukocytosis 02/09/2011  . Normocytic anemia 02/09/2011  . Mild Thrombocytosis 02/09/2011  . COPD (chronic obstructive pulmonary disease) ([place]) 02/09/2011  . Obesity 02/09/2011    Ailene Ravel, OTR/L,CBIS  [phone]  04/26/2015, 3:37 PM  [place] [address] [place], [place], [address] Phone: [phone]   Fax:  [phone]  Name: MATHEL BRAINARD MRN: [id number] Date of Birth: 1955-07-14

## 2015-04-28 ENCOUNTER — Ambulatory Visit (HOSPITAL_COMMUNITY): Payer: Medicare Other

## 2015-04-28 ENCOUNTER — Encounter (HOSPITAL_COMMUNITY): Payer: Self-pay

## 2015-04-28 DIAGNOSIS — R29898 Other symptoms and signs involving the musculoskeletal system: Secondary | ICD-10-CM

## 2015-04-28 DIAGNOSIS — M75102 Unspecified rotator cuff tear or rupture of left shoulder, not specified as traumatic: Secondary | ICD-10-CM | POA: Diagnosis not present

## 2015-04-28 DIAGNOSIS — M25612 Stiffness of left shoulder, not elsewhere classified: Secondary | ICD-10-CM

## 2015-04-28 DIAGNOSIS — M629 Disorder of muscle, unspecified: Secondary | ICD-10-CM

## 2015-04-28 DIAGNOSIS — M6289 Other specified disorders of muscle: Secondary | ICD-10-CM

## 2015-04-28 DIAGNOSIS — M25512 Pain in left shoulder: Secondary | ICD-10-CM | POA: Diagnosis not present

## 2015-04-28 NOTE — Therapy (Signed)
[place], [place], [address] Phone: [phone]   Fax:  [phone]  Occupational Therapy Treatment  Patient Details  Name: Debbie Mullins MRN: [id number] Date of Birth: 15-May-1955 Referring Provider: Dr. Lynda Rainwater  Encounter Date: 04/28/2015      OT End of Session - 04/28/15 1200    Visit Number 5   Number of Visits 16   Date for OT Re-Evaluation 06/14/15  mini reassess on 05/13/15   Authorization Type [employer] Medicare PPO - $40.00 copay   Authorization Time Period before 10th visit   Authorization - Visit Number 5   Authorization - Number of Visits 10   OT Start Time 1100   OT Stop Time 1145   OT Time Calculation (min) 45 min   Activity Tolerance Patient tolerated treatment well   Behavior During Therapy [facility] for tasks assessed/performed      Past Medical History  Diagnosis Date  . Anemia   . Leukocytosis 02/09/2011  . Normocytic anemia 02/09/2011  . Thrombocytosis ([place]) 02/09/2011  . COPD (chronic obstructive pulmonary disease) (HCC)     Albuterol inhaler prn;SIngulair at night  . Depression     takes Wellbutrin daily  . GERD (gastroesophageal reflux disease)     takes Nexium daily  . Hypertension     takes Lisinopril daily  . Diabetes mellitus     takes Metformin daily  . Asthma   . Pneumonia     many yrs ago  . History of colon polyps     benign  . Urinary frequency   . Urinary urgency   . Peripheral edema     takes Furosemide daily  . CHF (congestive heart failure) (HCC)     takes Furosemide daily   . Pulmonary nodules/lesions, multiple 08/09/2013  . Shortness of breath dyspnea     with exertion  . Weakness     numbness and tingling in both hands  . Joint pain   . Joint swelling   . Neck pain     HNP    Past Surgical History  Procedure Laterality Date  . Total knee arthroplasty Bilateral   . Tonsillectomy      as a child  . Colonoscopy    . Knee arthroplasty Left  08/10/2013    Procedure:  TOTAL KNEE ARTHROPLASTY;  Surgeon: [name], MD;  Location: [place];  Service: Orthopedics;  Laterality: Left;  Left Total Knee Arthroplasty Revision, Cemented, Semi-constrained,   . Left and right heart catheterization with coronary angiogram N/A 01/07/2012    Procedure: LEFT AND RIGHT HEART CATHETERIZATION WITH CORONARY ANGIOGRAM;  Surgeon: Thayer Headings, MD;  Location: [facility] CATH LAB;  Service: Cardiovascular;  Laterality: N/A;  . Anterior cervical decomp/discectomy fusion N/A 11/17/2014    Procedure: Cervical four- cervical five Anterior Cervical Decompression with fusion and bonegraft;  Surgeon: Ashok Pall, MD;  Location: [place] NEURO ORS;  Service: Neurosurgery;  Laterality: N/A;  C45 anterior cervical decompression with fusion plating and bonegraft    There were no vitals filed for this visit.  Visit Diagnosis:  Shoulder stiffness, left  Shoulder weakness  Tight fascia      Subjective Assessment - 04/28/15 1120    Subjective  S: My right shoulder is really starting to bother me. I don't know why. Maybe it's because I'm using it more.    Currently in Pain? Yes   Pain Score 6    Pain Location Shoulder   Pain Orientation  Right   Pain Descriptors / Indicators Sore   Pain Type Acute pain   Pain Radiating Towards N/A   Pain Onset Yesterday   Aggravating Factors  Use   Pain Relieving Factors Rest   Effect of Pain on Daily Activities Unable to sleep on right side.    Multiple Pain Sites No            [facility] OT Assessment - 04/28/15 1122    Assessment   Diagnosis Left rotator cuff tear   Precautions   Precautions None                  OT Treatments/Exercises (OP) - 04/28/15 1122    Exercises   Exercises Shoulder   Shoulder Exercises: Supine   Protraction PROM;AAROM;10 reps   Horizontal ABduction PROM;AAROM;10 reps   External Rotation PROM;10 reps;AAROM   Internal Rotation PROM;AAROM;10 reps   Flexion PROM;AAROM;10 reps   ABduction  PROM;AAROM;10 reps   Shoulder Exercises: Seated   Elevation AROM;15 reps   Extension AROM;15 reps   Row AROM;15 reps   Protraction AAROM;5 reps   External Rotation AAROM;5 reps   Internal Rotation AAROM;5 reps   Flexion AAROM;5 reps;Limitations   Shoulder Exercises: Pulleys   Flexion 1 minute   ABduction 1 minute   ABduction Limitations with therapist facilitating correct movement   Shoulder Exercises: Therapy Ball   Flexion 15 reps   ABduction 15 reps   Shoulder Exercises: ROM/Strengthening   Proximal Shoulder Strengthening, Supine 10X   Manual Therapy   Manual Therapy Myofascial release   Manual therapy comments manual therapy completed seperately from all other interventions this date   Myofascial Release myofascial release and manual stretching to left upper arm, scapular, and shoulder region to decrease pain and restrictions and improve pain free mobility in her left shoulder region                  OT Short Term Goals - 04/18/15 1558    OT SHORT TERM GOAL #1   Title Pt will be educated and independent with HEP to increase functional use of LUE during daily tasks.    Time 4   Period Weeks   Status On-going   OT SHORT TERM GOAL #2   Title Patient will increase P/ROM to WNL to increase ability to complete reaching activities below shoulder height.    Time 4   Period Weeks   Status On-going   OT SHORT TERM GOAL #3   Title Patient will increase LUE strength to 3+/5 to increase ability to pull pants up over hips.    Time 4   Period Weeks   Status On-going   OT SHORT TERM GOAL #4   Title Patient will decrease fascial restrictions to min amount to increase functional mobility in LUE.    Time 4   Period Weeks   Status On-going   OT SHORT TERM GOAL #5   Title Patient will decrease pain level to 3/10 or less when using LUE during daily tasks.    Time 4   Period Weeks   Status On-going           OT Long Term Goals - 04/18/15 1559    OT LONG TERM GOAL #1    Title Patient will return to highest level of independence with LUE during daily tasks.    Time 8   Period Weeks   Status On-going   OT LONG TERM GOAL #2   Title Patient will increase  A/ROM to [facility] to increase ability to use LUE more during daily tasks.    Time 8   Period Weeks   Status On-going   OT LONG TERM GOAL #3   Title Patient will increase LUE strength to 4/5 to increase ability to complete lightweight household tasks.    Time 8   Period Weeks   Status On-going   OT LONG TERM GOAL #4   Title Patient will decrease fascial restrictions to trace amount in LUE to increase functional mobility during daily tasks.    Time 8   Period Weeks   Status On-going   OT LONG TERM GOAL #5   Title Patient will decrease pain level in LUE to 1/10 or less with use during daily tasks.    Time 8   Period Weeks   Status On-going               Plan - 04/28/15 1200    Clinical Impression Statement A: Patient shows full passive ROM during supine stretching although is very limited with ROM during AA/ROM. VC still needed for form and technique.    Plan P: Attempt seated bent over rows/retraction with a 1# or A/ROM.     *Sent a fax to Dr. Geronimo Running today requesting therapy on Right shoulder for pain.    Problem List Patient Active Problem List   Diagnosis Date Noted  . Osteoarthritis of spine with myelopathy, cervical region 11/17/2014  . Failed total left knee replacement ([place]) 08/10/2013  . Pulmonary nodules/lesions, multiple 08/09/2013  . HLD (hyperlipidemia) 01/22/2012  . Pulmonary hypertension ([place]) 01/08/2012  . Acute on chronic diastolic heart failure ([place]) 01/05/2012  . Acute respiratory failure with hypoxia ([place]) 01/03/2012  . Pulmonary edema, acute ([place]) 01/03/2012  . 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 HTN (hypertension) 01/02/2012  . COPD with acute exacerbation ([place]) 01/02/2012  . Tobacco abuse 07/04/2011  . 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  Leukocytosis 02/09/2011  . Normocytic anemia 02/09/2011  . Mild Thrombocytosis 02/09/2011  . COPD (chronic obstructive pulmonary disease) ([place]) 02/09/2011  . Obesity 02/09/2011    Ailene Ravel, OTR/L,CBIS  [phone]  04/28/2015, 12:24 PM  [facility] [address] [place], [place], [address] Phone: [phone]   Fax:  [phone]  Name: Debbie Mullins MRN: [id number] Date of Birth: 1955/09/14

## 2015-05-03 ENCOUNTER — Ambulatory Visit (HOSPITAL_COMMUNITY): Payer: Medicare Other

## 2015-05-03 ENCOUNTER — Encounter (HOSPITAL_COMMUNITY): Payer: Self-pay

## 2015-05-03 DIAGNOSIS — M629 Disorder of muscle, unspecified: Secondary | ICD-10-CM

## 2015-05-03 DIAGNOSIS — M25612 Stiffness of left shoulder, not elsewhere classified: Secondary | ICD-10-CM | POA: Diagnosis not present

## 2015-05-03 DIAGNOSIS — M6289 Other specified disorders of muscle: Secondary | ICD-10-CM

## 2015-05-03 DIAGNOSIS — M25512 Pain in left shoulder: Secondary | ICD-10-CM | POA: Diagnosis not present

## 2015-05-03 DIAGNOSIS — R29898 Other symptoms and signs involving the musculoskeletal system: Secondary | ICD-10-CM

## 2015-05-03 DIAGNOSIS — M75102 Unspecified rotator cuff tear or rupture of left shoulder, not specified as traumatic: Secondary | ICD-10-CM | POA: Diagnosis not present

## 2015-05-03 NOTE — Therapy (Signed)
[place], [place], [address] Phone: [phone]   Fax:  [phone]  Occupational Therapy Treatment  Patient Details  Name: Debbie Mullins MRN: [id number] Date of Birth: [birth year] Referring Provider: Dr. Lynda Rainwater  Encounter Date: 05/03/2015      OT End of Session - 05/03/15 1427    Visit Number 6   Number of Visits 16   Date for OT Re-Evaluation 06/14/15  mini reassess on 05/13/15   Authorization Type [employer] Medicare PPO - $40.00 copay   Authorization Time Period before 10th visit   Authorization - Visit Number 6   Authorization - Number of Visits 10   OT Start Time 1300   OT Stop Time 1345   OT Time Calculation (min) 45 min   Activity Tolerance Patient tolerated treatment well   Behavior During Therapy [facility] for tasks assessed/performed      Past Medical History  Diagnosis Date  . Anemia   . Leukocytosis 02/09/2011  . Normocytic anemia 02/09/2011  . Thrombocytosis ([place]) 02/09/2011  . COPD (chronic obstructive pulmonary disease) (HCC)     Albuterol inhaler prn;SIngulair at night  . Depression     takes Wellbutrin daily  . GERD (gastroesophageal reflux disease)     takes Nexium daily  . Hypertension     takes Lisinopril daily  . Diabetes mellitus     takes Metformin daily  . Asthma   . Pneumonia     many yrs ago  . History of colon polyps     benign  . Urinary frequency   . Urinary urgency   . Peripheral edema     takes Furosemide daily  . CHF (congestive heart failure) (HCC)     takes Furosemide daily   . Pulmonary nodules/lesions, multiple 08/09/2013  . Shortness of breath dyspnea     with exertion  . Weakness     numbness and tingling in both hands  . Joint pain   . Joint swelling   . Neck pain     HNP    Past Surgical History  Procedure Laterality Date  . Total knee arthroplasty Bilateral   . Tonsillectomy      as a child  . Colonoscopy    . Knee arthroplasty Left  08/10/2013    Procedure:  TOTAL KNEE ARTHROPLASTY;  Surgeon: [name], MD;  Location: [place];  Service: Orthopedics;  Laterality: Left;  Left Total Knee Arthroplasty Revision, Cemented, Semi-constrained,   . Left and right heart catheterization with coronary angiogram N/A 01/07/2012    Procedure: LEFT AND RIGHT HEART CATHETERIZATION WITH CORONARY ANGIOGRAM;  Surgeon: Thayer Headings, MD;  Location: [facility] CATH LAB;  Service: Cardiovascular;  Laterality: N/A;  . Anterior cervical decomp/discectomy fusion N/A 11/17/2014    Procedure: Cervical four- cervical five Anterior Cervical Decompression with fusion and bonegraft;  Surgeon: Ashok Pall, MD;  Location: [place] [facility]UR[facility];  Service: Neurosurgery;  Laterality: N/A;  C45 anterior cervical decompression with fusion plating and bonegraft    There were no vitals filed for this visit.  Visit Diagnosis:  Shoulder stiffness, left  Shoulder weakness  Tight fascia      Subjective Assessment - 05/03/15 1316    Subjective  S: My shoulder feels pretty good today.   Currently in Pain? No/denies            [facility] OT Assessment - 05/03/15 1315    Assessment   Diagnosis Left rotator cuff tear  Precautions   Precautions None                  OT Treatments/Exercises (OP) - 05/03/15 1314    Exercises   Exercises Shoulder   Shoulder Exercises: Supine   Protraction PROM;AAROM;10 reps   Horizontal ABduction PROM;AAROM;10 reps   External Rotation PROM;10 reps;AAROM   Internal Rotation PROM;AAROM;10 reps   Flexion PROM;AAROM;10 reps   ABduction PROM;AAROM;10 reps   Shoulder Exercises: Seated   Elevation Strengthening;10 reps   Elevation Weight (lbs) 1   Extension Strengthening;10 reps   Extension Weight (lbs) 1   Row AROM;10 reps  bent over   Row Limitations Physical facilitation to keep UB/Hip flexion angle from changing.   Protraction AAROM;10 reps   Horizontal ABduction AAROM;10 reps   External Rotation AAROM;10 reps   Internal  Rotation AAROM;10 reps   Flexion AAROM;10 reps   Abduction AAROM;10 reps   Shoulder Exercises: ROM/Strengthening   Thumb Tacks Attempted. Unable to complete   Proximal Shoulder Strengthening, Supine 10X   Manual Therapy   Manual Therapy Myofascial release   Manual therapy comments manual therapy completed seperately from all other interventions this date   Myofascial Release myofascial release and manual stretching to left upper arm, scapular, and shoulder region to decrease pain and restrictions and improve pain free mobility in her left shoulder region                  OT Short Term Goals - 04/18/15 1558    OT SHORT TERM GOAL #1   Title Pt will be educated and independent with HEP to increase functional use of LUE during daily tasks.    Time 4   Period Weeks   Status On-going   OT SHORT TERM GOAL #2   Title Patient will increase P/ROM to WNL to increase ability to complete reaching activities below shoulder height.    Time 4   Period Weeks   Status On-going   OT SHORT TERM GOAL #3   Title Patient will increase LUE strength to 3+/5 to increase ability to pull pants up over hips.    Time 4   Period Weeks   Status On-going   OT SHORT TERM GOAL #4   Title Patient will decrease fascial restrictions to min amount to increase functional mobility in LUE.    Time 4   Period Weeks   Status On-going   OT SHORT TERM GOAL #5   Title Patient will decrease pain level to 3/10 or less when using LUE during daily tasks.    Time 4   Period Weeks   Status On-going           OT Long Term Goals - 04/18/15 1559    OT LONG TERM GOAL #1   Title Patient will return to highest level of independence with LUE during daily tasks.    Time 8   Period Weeks   Status On-going   OT LONG TERM GOAL #2   Title Patient will increase A/ROM to [facility] to increase ability to use LUE more during daily tasks.    Time 8   Period Weeks   Status On-going   OT LONG TERM GOAL #3   Title Patient will  increase LUE strength to 4/5 to increase ability to complete lightweight household tasks.    Time 8   Period Weeks   Status On-going   OT LONG TERM GOAL #4   Title Patient will decrease fascial restrictions to trace  amount in LUE to increase functional mobility during daily tasks.    Time 8   Period Weeks   Status On-going   OT LONG TERM GOAL #5   Title Patient will decrease pain level in LUE to 1/10 or less with use during daily tasks.    Time 8   Period Weeks   Status On-going               Plan - 05/03/15 1427    Clinical Impression Statement A: Patient was able to complete 10 repetitions of supine and seated exercises this session. Attempted thumb tacks although did not have the shoulder and scapular strength to complete.   Plan P: Added scapular theraband exercises.        Problem List Patient Active Problem List   Diagnosis Date Noted  . Osteoarthritis of spine with myelopathy, cervical region 11/17/2014  . Failed total left knee replacement ([place]) 08/10/2013  . Pulmonary nodules/lesions, multiple 08/09/2013  . HLD (hyperlipidemia) 01/22/2012  . Pulmonary hypertension ([place]) 01/08/2012  . Acute on chronic diastolic heart failure ([place]) 01/05/2012  . Acute respiratory failure with hypoxia ([place]) 01/03/2012  . Pulmonary edema, acute ([place]) 01/03/2012  . 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 HTN (hypertension) 01/02/2012  . COPD with acute exacerbation ([place]) 01/02/2012  . Tobacco abuse 07/04/2011  . 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 Leukocytosis 02/09/2011  . Normocytic anemia 02/09/2011  . Mild Thrombocytosis 02/09/2011  . COPD (chronic obstructive pulmonary disease) ([place]) 02/09/2011  . Obesity 02/09/2011    Ailene Ravel, OTR/L,CBIS  [phone]  05/03/2015, 2:28 PM  [facility] [address] [place], [place], [address] Phone: [phone]   Fax:  [phone]  Name: SHAKEYIA KATKO MRN:  [id number] Date of Birth: [birth year]

## 2015-05-05 ENCOUNTER — Encounter (HOSPITAL_COMMUNITY): Payer: Self-pay

## 2015-05-05 ENCOUNTER — Ambulatory Visit (HOSPITAL_COMMUNITY): Payer: Medicare Other

## 2015-05-05 DIAGNOSIS — M629 Disorder of muscle, unspecified: Secondary | ICD-10-CM | POA: Diagnosis not present

## 2015-05-05 DIAGNOSIS — M6289 Other specified disorders of muscle: Secondary | ICD-10-CM

## 2015-05-05 DIAGNOSIS — R29898 Other symptoms and signs involving the musculoskeletal system: Secondary | ICD-10-CM | POA: Diagnosis not present

## 2015-05-05 DIAGNOSIS — M25512 Pain in left shoulder: Secondary | ICD-10-CM | POA: Diagnosis not present

## 2015-05-05 DIAGNOSIS — M25612 Stiffness of left shoulder, not elsewhere classified: Secondary | ICD-10-CM | POA: Diagnosis not present

## 2015-05-05 DIAGNOSIS — M75102 Unspecified rotator cuff tear or rupture of left shoulder, not specified as traumatic: Secondary | ICD-10-CM | POA: Diagnosis not present

## 2015-05-05 NOTE — Therapy (Signed)
[place], [place], [address] Phone: [phone]   Fax:  [phone]  Occupational Therapy Treatment  Patient Details  Name: Debbie Mullins MRN: [id number] Date of Birth: [birth year] Referring Provider: Dr. Lynda Rainwater  Encounter Date: 05/05/2015      OT End of Session - 05/05/15 1013    Visit Number 7   Number of Visits 16   Date for OT Re-Evaluation 06/14/15  mini reassess on 05/13/15   Authorization Type [employer] Medicare PPO - $40.00 copay   Authorization Time Period before 10th visit   Authorization - Visit Number 7   Authorization - Number of Visits 10   OT Start Time 0930   OT Stop Time 1015   OT Time Calculation (min) 45 min   Activity Tolerance Patient tolerated treatment well   Behavior During Therapy [facility] for tasks assessed/performed      Past Medical History  Diagnosis Date  . Anemia   . Leukocytosis 02/09/2011  . Normocytic anemia 02/09/2011  . Thrombocytosis ([place]) 02/09/2011  . COPD (chronic obstructive pulmonary disease) (HCC)     Albuterol inhaler prn;SIngulair at night  . Depression     takes Wellbutrin daily  . GERD (gastroesophageal reflux disease)     takes Nexium daily  . Hypertension     takes Lisinopril daily  . Diabetes mellitus     takes Metformin daily  . Asthma   . Pneumonia     many yrs ago  . History of colon polyps     benign  . Urinary frequency   . Urinary urgency   . Peripheral edema     takes Furosemide daily  . CHF (congestive heart failure) (HCC)     takes Furosemide daily   . Pulmonary nodules/lesions, multiple 08/09/2013  . Shortness of breath dyspnea     with exertion  . Weakness     numbness and tingling in both hands  . Joint pain   . Joint swelling   . Neck pain     HNP    Past Surgical History  Procedure Laterality Date  . Total knee arthroplasty Bilateral   . Tonsillectomy      as a child  . Colonoscopy    . Knee arthroplasty Left  08/10/2013    Procedure:  TOTAL KNEE ARTHROPLASTY;  Surgeon: [name], MD;  Location: [place];  Service: Orthopedics;  Laterality: Left;  Left Total Knee Arthroplasty Revision, Cemented, Semi-constrained,   . Left and right heart catheterization with coronary angiogram N/A 01/07/2012    Procedure: LEFT AND RIGHT HEART CATHETERIZATION WITH CORONARY ANGIOGRAM;  Surgeon: Thayer Headings, MD;  Location: [facility] CATH LAB;  Service: Cardiovascular;  Laterality: N/A;  . Anterior cervical decomp/discectomy fusion N/A 11/17/2014    Procedure: Cervical four- cervical five Anterior Cervical Decompression with fusion and bonegraft;  Surgeon: Ashok Pall, MD;  Location: [place] [facility];  Service: Neurosurgery;  Laterality: N/A;  C45 anterior cervical decompression with fusion plating and bonegraft    There were no vitals filed for this visit.  Visit Diagnosis:  Shoulder weakness  Tight fascia  Shoulder stiffness, left      Subjective Assessment - 05/05/15 0955    Subjective  S: My thumb has started popping. I just noticed that.    Currently in Pain? No/denies            [facility] OT Assessment - 05/05/15 0955    Assessment   Diagnosis Left rotator  cuff tear   Precautions   Precautions None                  OT Treatments/Exercises (OP) - 05/05/15 0955    Exercises   Exercises Shoulder   Shoulder Exercises: Supine   Protraction PROM;5 reps;AAROM;12 reps   Horizontal ABduction PROM;5 reps;AAROM;12 reps   External Rotation PROM;5 reps;AAROM;12 reps   Internal Rotation PROM;5 reps;AAROM;12 reps   Flexion PROM;5 reps;AAROM;12 reps   ABduction PROM;5 reps;AAROM;12 reps   ABduction Limitations Only able to complete 25% range.   Shoulder Exercises: Seated   Elevation AROM;12 reps   Extension Theraband;10 reps   Theraband Level (Shoulder Extension) Level 2 (Red)   Row Theraband;10 reps   Theraband Level (Shoulder Row) Level 2 (Red)   Protraction AAROM;10 reps   Flexion AAROM;10 reps    Flexion Limitations 25% range   Other Seated Exercises Seated shoulder rolls forward and back 12X   Manual Therapy   Manual Therapy Myofascial release   Manual therapy comments manual therapy completed seperately from all other interventions this date   Myofascial Release myofascial release and manual stretching to left upper arm, scapular, and shoulder region to decrease pain and restrictions and improve pain free mobility in her left shoulder region                  OT Short Term Goals - 04/18/15 1558    OT SHORT TERM GOAL #1   Title Pt will be educated and independent with HEP to increase functional use of LUE during daily tasks.    Time 4   Period Weeks   Status On-going   OT SHORT TERM GOAL #2   Title Patient will increase P/ROM to WNL to increase ability to complete reaching activities below shoulder height.    Time 4   Period Weeks   Status On-going   OT SHORT TERM GOAL #3   Title Patient will increase LUE strength to 3+/5 to increase ability to pull pants up over hips.    Time 4   Period Weeks   Status On-going   OT SHORT TERM GOAL #4   Title Patient will decrease fascial restrictions to min amount to increase functional mobility in LUE.    Time 4   Period Weeks   Status On-going   OT SHORT TERM GOAL #5   Title Patient will decrease pain level to 3/10 or less when using LUE during daily tasks.    Time 4   Period Weeks   Status On-going           OT Long Term Goals - 04/18/15 1559    OT LONG TERM GOAL #1   Title Patient will return to highest level of independence with LUE during daily tasks.    Time 8   Period Weeks   Status On-going   OT LONG TERM GOAL #2   Title Patient will increase A/ROM to [facility] to increase ability to use LUE more during daily tasks.    Time 8   Period Weeks   Status On-going   OT LONG TERM GOAL #3   Title Patient will increase LUE strength to 4/5 to increase ability to complete lightweight household tasks.    Time 8    Period Weeks   Status On-going   OT LONG TERM GOAL #4   Title Patient will decrease fascial restrictions to trace amount in LUE to increase functional mobility during daily tasks.    Time 8  Period Weeks   Status On-going   OT LONG TERM GOAL #5   Title Patient will decrease pain level in LUE to 1/10 or less with use during daily tasks.    Time 8   Period Weeks   Status On-going               Plan - 05/05/15 1030    Clinical Impression Statement A: Patient has increased difficulty with performing bicep flexion actively which made completed external rotation difficult. Add theraband scapular exercises  seated to increase scapular mobility and strengthening. Min VC for form and technique.   Plan P: Add UBE        Problem List Patient Active Problem List   Diagnosis Date Noted  . Osteoarthritis of spine with myelopathy, cervical region 11/17/2014  . Failed total left knee replacement ([place]) 08/10/2013  . Pulmonary nodules/lesions, multiple 08/09/2013  . HLD (hyperlipidemia) 01/22/2012  . Pulmonary hypertension ([place]) 01/08/2012  . Acute on chronic diastolic heart failure ([place]) 01/05/2012  . Acute respiratory failure with hypoxia ([place]) 01/03/2012  . Pulmonary edema, acute ([place]) 01/03/2012  . 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 HTN (hypertension) 01/02/2012  . COPD with acute exacerbation ([place]) 01/02/2012  . Tobacco abuse 07/04/2011  . 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 Leukocytosis 02/09/2011  . Normocytic anemia 02/09/2011  . Mild Thrombocytosis 02/09/2011  . COPD (chronic obstructive pulmonary disease) ([place]) 02/09/2011  . Obesity 02/09/2011    Ailene Ravel, OTR/L,CBIS  [phone]  05/05/2015, 10:38 AM  [place] [address] [place], [place], [address] Phone: [phone]   Fax:  [phone]  Name: Debbie Mullins MRN: [id number] Date of Birth: 10-12-1955

## 2015-05-10 ENCOUNTER — Ambulatory Visit (HOSPITAL_COMMUNITY): Payer: Medicare Other

## 2015-05-10 DIAGNOSIS — M25612 Stiffness of left shoulder, not elsewhere classified: Secondary | ICD-10-CM | POA: Diagnosis not present

## 2015-05-10 DIAGNOSIS — R29898 Other symptoms and signs involving the musculoskeletal system: Secondary | ICD-10-CM | POA: Diagnosis not present

## 2015-05-10 DIAGNOSIS — M6289 Other specified disorders of muscle: Secondary | ICD-10-CM

## 2015-05-10 DIAGNOSIS — M25512 Pain in left shoulder: Secondary | ICD-10-CM | POA: Diagnosis not present

## 2015-05-10 DIAGNOSIS — M629 Disorder of muscle, unspecified: Secondary | ICD-10-CM

## 2015-05-10 DIAGNOSIS — M75102 Unspecified rotator cuff tear or rupture of left shoulder, not specified as traumatic: Secondary | ICD-10-CM | POA: Diagnosis not present

## 2015-05-10 NOTE — Therapy (Signed)
[place], [place], [address] Phone: [phone]   Fax:  [phone]  Occupational Therapy Treatment  Patient Details  Name: Debbie Mullins MRN: [id number] Date of Birth: 05-16-1955 Referring Provider: Dr. Lynda Rainwater  Encounter Date: 05/10/2015      OT End of Session - 05/10/15 1143    Visit Number 8   Number of Visits 16   Date for OT Re-Evaluation 06/14/15  mini reassess on 05/13/15   Authorization Type [employer] Medicare PPO - $40.00 copay   Authorization Time Period before 10th visit   Authorization - Visit Number 8   Authorization - Number of Visits 10   OT Start Time 1105   OT Stop Time 1145   OT Time Calculation (min) 40 min   Activity Tolerance Patient tolerated treatment well   Behavior During Therapy [facility] for tasks assessed/performed      Past Medical History  Diagnosis Date  . Anemia   . Leukocytosis 02/09/2011  . Normocytic anemia 02/09/2011  . Thrombocytosis ([place]) 02/09/2011  . COPD (chronic obstructive pulmonary disease) (HCC)     Albuterol inhaler prn;SIngulair at night  . Depression     takes Wellbutrin daily  . GERD (gastroesophageal reflux disease)     takes Nexium daily  . Hypertension     takes Lisinopril daily  . Diabetes mellitus     takes Metformin daily  . Asthma   . Pneumonia     many yrs ago  . History of colon polyps     benign  . Urinary frequency   . Urinary urgency   . Peripheral edema     takes Furosemide daily  . CHF (congestive heart failure) (HCC)     takes Furosemide daily   . Pulmonary nodules/lesions, multiple 08/09/2013  . Shortness of breath dyspnea     with exertion  . Weakness     numbness and tingling in both hands  . Joint pain   . Joint swelling   . Neck pain     HNP    Past Surgical History  Procedure Laterality Date  . Total knee arthroplasty Bilateral   . Tonsillectomy      as a child  . Colonoscopy    . Knee arthroplasty Left  08/10/2013    Procedure:  TOTAL KNEE ARTHROPLASTY;  Surgeon: [name], MD;  Location: [place];  Service: Orthopedics;  Laterality: Left;  Left Total Knee Arthroplasty Revision, Cemented, Semi-constrained,   . Left and right heart catheterization with coronary angiogram N/A 01/07/2012    Procedure: LEFT AND RIGHT HEART CATHETERIZATION WITH CORONARY ANGIOGRAM;  Surgeon: Thayer Headings, MD;  Location: [facility] CATH LAB;  Service: Cardiovascular;  Laterality: N/A;  . Anterior cervical decomp/discectomy fusion N/A 11/17/2014    Procedure: Cervical four- cervical five Anterior Cervical Decompression with fusion and bonegraft;  Surgeon: Ashok Pall, MD;  Location: [place] [facility];  Service: Neurosurgery;  Laterality: N/A;  C45 anterior cervical decompression with fusion plating and bonegraft    There were no vitals filed for this visit.  Visit Diagnosis:  Shoulder weakness  Tight fascia  Shoulder stiffness, left          [facility] OT Assessment - 05/10/15 1137    Assessment   Diagnosis Left rotator cuff tear   Precautions   Precautions None                  OT Treatments/Exercises (OP) - 05/10/15 1137  Exercises   Exercises Shoulder   Shoulder Exercises: Supine   Protraction PROM;5 reps;AAROM;12 reps   Horizontal ABduction PROM;5 reps;AAROM;12 reps   External Rotation PROM;5 reps;AAROM;12 reps   Internal Rotation PROM;5 reps;AAROM;12 reps   Flexion PROM;5 reps;AAROM;12 reps   ABduction PROM;5 reps;AAROM;12 reps   Shoulder Exercises: Seated   Elevation AROM;15 reps   Extension AROM;15 reps   Row AROM;15 reps   Flexion AAROM;10 reps   Shoulder Exercises: ROM/Strengthening   UBE (Upper Arm Bike) Level 1 2' forward 2' reverse   Proximal Shoulder Strengthening, Supine 12X  holding Left arm at 25% range shoulder flexion   Manual Therapy   Manual Therapy Myofascial release   Manual therapy comments manual therapy completed seperately from all other interventions this date    Myofascial Release myofascial release and manual stretching to left upper arm, scapular, and shoulder region to decrease pain and restrictions and improve pain free mobility in her left shoulder region                  OT Short Term Goals - 04/18/15 1558    OT SHORT TERM GOAL #1   Title Pt will be educated and independent with HEP to increase functional use of LUE during daily tasks.    Time 4   Period Weeks   Status On-going   OT SHORT TERM GOAL #2   Title Patient will increase P/ROM to WNL to increase ability to complete reaching activities below shoulder height.    Time 4   Period Weeks   Status On-going   OT SHORT TERM GOAL #3   Title Patient will increase LUE strength to 3+/5 to increase ability to pull pants up over hips.    Time 4   Period Weeks   Status On-going   OT SHORT TERM GOAL #4   Title Patient will decrease fascial restrictions to min amount to increase functional mobility in LUE.    Time 4   Period Weeks   Status On-going   OT SHORT TERM GOAL #5   Title Patient will decrease pain level to 3/10 or less when using LUE during daily tasks.    Time 4   Period Weeks   Status On-going           OT Long Term Goals - 04/18/15 1559    OT LONG TERM GOAL #1   Title Patient will return to highest level of independence with LUE during daily tasks.    Time 8   Period Weeks   Status On-going   OT LONG TERM GOAL #2   Title Patient will increase A/ROM to [facility] to increase ability to use LUE more during daily tasks.    Time 8   Period Weeks   Status On-going   OT LONG TERM GOAL #3   Title Patient will increase LUE strength to 4/5 to increase ability to complete lightweight household tasks.    Time 8   Period Weeks   Status On-going   OT LONG TERM GOAL #4   Title Patient will decrease fascial restrictions to trace amount in LUE to increase functional mobility during daily tasks.    Time 8   Period Weeks   Status On-going   OT LONG TERM GOAL #5   Title  Patient will decrease pain level in LUE to 1/10 or less with use during daily tasks.    Time 8   Period Weeks   Status On-going  Plan - 05/10/15 1143    Clinical Impression Statement A: Added UBE bike and patient reports increased fatigue with use. Continues to need VC for form and technique.    Plan P: Mini reassessment and update G code.        Problem List Patient Active Problem List   Diagnosis Date Noted  . Osteoarthritis of spine with myelopathy, cervical region 11/17/2014  . Failed total left knee replacement ([place]) 08/10/2013  . Pulmonary nodules/lesions, multiple 08/09/2013  . HLD (hyperlipidemia) 01/22/2012  . Pulmonary hypertension ([place]) 01/08/2012  . Acute on chronic diastolic heart failure ([place]) 01/05/2012  . Acute respiratory failure with hypoxia ([place]) 01/03/2012  . Pulmonary edema, acute ([place]) 01/03/2012  . 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 HTN (hypertension) 01/02/2012  . COPD with acute exacerbation ([place]) 01/02/2012  . Tobacco abuse 07/04/2011  . 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 Leukocytosis 02/09/2011  . Normocytic anemia 02/09/2011  . Mild Thrombocytosis 02/09/2011  . COPD (chronic obstructive pulmonary disease) ([place]) 02/09/2011  . Obesity 02/09/2011    Ailene Ravel, OTR/L,CBIS  [phone]  05/10/2015, 11:53 AM  [place] [address] [place], [place], [address] Phone: [phone]   Fax:  [phone]  Name: MECCA OLAZABAL MRN: [id number] Date of Birth: 23-Nov-1955

## 2015-05-12 ENCOUNTER — Encounter (HOSPITAL_COMMUNITY): Payer: Self-pay

## 2015-05-12 ENCOUNTER — Ambulatory Visit (HOSPITAL_COMMUNITY): Payer: Medicare Other | Attending: Physician Assistant

## 2015-05-12 DIAGNOSIS — M25612 Stiffness of left shoulder, not elsewhere classified: Secondary | ICD-10-CM

## 2015-05-12 DIAGNOSIS — M6289 Other specified disorders of muscle: Secondary | ICD-10-CM

## 2015-05-12 DIAGNOSIS — R29898 Other symptoms and signs involving the musculoskeletal system: Secondary | ICD-10-CM

## 2015-05-12 DIAGNOSIS — M25512 Pain in left shoulder: Secondary | ICD-10-CM | POA: Diagnosis not present

## 2015-05-12 DIAGNOSIS — M629 Disorder of muscle, unspecified: Secondary | ICD-10-CM | POA: Diagnosis not present

## 2015-05-12 NOTE — Therapy (Signed)
[place], [place], [address] Phone: [phone]   Fax:  [phone]  Occupational Therapy Treatment And mini reassessment Patient Details  Name: Debbie Mullins MRN: [id number] Date of Birth: 1955-03-16 Referring Provider: Dr. Lynda Rainwater  Encounter Date: 05/12/2015      OT End of Session - 05/12/15 1242    Visit Number 9   Number of Visits 16   Date for OT Re-Evaluation 06/14/15   Authorization Type [employer] Medicare PPO - $40.00 copay   Authorization Time Period before 19th visit   Authorization - Visit Number 9   Authorization - Number of Visits 19   OT Start Time 1105   OT Stop Time 1145   OT Time Calculation (min) 40 min   Activity Tolerance Patient tolerated treatment well   Behavior During Therapy [facility] for tasks assessed/performed      Past Medical History  Diagnosis Date  . Anemia   . Leukocytosis 02/09/2011  . Normocytic anemia 02/09/2011  . Thrombocytosis ([place]) 02/09/2011  . COPD (chronic obstructive pulmonary disease) (HCC)     Albuterol inhaler prn;SIngulair at night  . Depression     takes Wellbutrin daily  . GERD (gastroesophageal reflux disease)     takes Nexium daily  . Hypertension     takes Lisinopril daily  . Diabetes mellitus     takes Metformin daily  . Asthma   . Pneumonia     many yrs ago  . History of colon polyps     benign  . Urinary frequency   . Urinary urgency   . Peripheral edema     takes Furosemide daily  . CHF (congestive heart failure) (HCC)     takes Furosemide daily   . Pulmonary nodules/lesions, multiple 08/09/2013  . Shortness of breath dyspnea     with exertion  . Weakness     numbness and tingling in both hands  . Joint pain   . Joint swelling   . Neck pain     HNP    Past Surgical History  Procedure Laterality Date  . Total knee arthroplasty Bilateral   . Tonsillectomy      as a child  . Colonoscopy    . Knee arthroplasty Left 08/10/2013     Procedure:  TOTAL KNEE ARTHROPLASTY;  Surgeon: [name], MD;  Location: [facility];  Service: Orthopedics;  Laterality: Left;  Left Total Knee Arthroplasty Revision, Cemented, Semi-constrained,   . Left and right heart catheterization with coronary angiogram N/A 01/07/2012    Procedure: LEFT AND RIGHT HEART CATHETERIZATION WITH CORONARY ANGIOGRAM;  Surgeon: Thayer Headings, MD;  Location: [facility] C[facility];  Service: Cardiovascular;  Laterality: N/A;  . Anterior cervical decomp/discectomy fusion N/A 11/17/2014    Procedure: Cervical four- cervical five Anterior Cervical Decompression with fusion and bonegraft;  Surgeon: Ashok Pall, MD;  Location: [facility];  Service: Neurosurgery;  Laterality: N/A;  C45 anterior cervical decompression with fusion plating and bonegraft    There were no vitals filed for this visit.  Visit Diagnosis:  Shoulder weakness  Tight fascia  Shoulder stiffness, left  Pain in joint of left shoulder          [facility] OT Assessment - 05/12/15 1109    Assessment   Diagnosis Left rotator cuff tear   Precautions   Precautions None   AROM   Overall AROM Comments Assessed supine. IR/er adducted   AROM Assessment Site Shoulder   Right/Left  Shoulder Left   Left Shoulder Flexion 65 Degrees  previous: 65   Left Shoulder ABduction 79 Degrees  previou: 75   Left Shoulder Internal Rotation 90 Degrees  previous: 90   Left Shoulder External Rotation 90 Degrees  previous: 80   PROM   Overall PROM Comments Assessed supine. IR/er adducted   PROM Assessment Site Shoulder   Right/Left Shoulder Left   Left Shoulder Flexion 165 Degrees  previous: 152   Left Shoulder ABduction 181 Degrees  previous: 141   Left Shoulder Internal Rotation 90 Degrees  previous: 90   Left Shoulder External Rotation 90 Degrees  previous: 90   Strength   Overall Strength Comments Assessed supine. IR/er adducted   Strength Assessment Site Shoulder   Right/Left Shoulder Left   Left Shoulder  Flexion 3-/5  same at eval   Left Shoulder ABduction 3-/5  same at eval   Left Shoulder Internal Rotation 3+/5  previous: 3/5   Left Shoulder External Rotation 3/5  previous: 3-/5                  OT Treatments/Exercises (OP) - 05/12/15 1128    Exercises   Exercises Shoulder   Shoulder Exercises: Supine   Protraction PROM;5 reps;AAROM;12 reps   Horizontal ABduction PROM;5 reps;AAROM;12 reps   External Rotation PROM;5 reps;AAROM;12 reps   Internal Rotation PROM;5 reps;AAROM;12 reps   Flexion PROM;5 reps;AAROM;12 reps   ABduction PROM;5 reps;AAROM;12 reps   ABduction Limitations Only able to complete 25% range.   Shoulder Exercises: Seated   Elevation AROM;15 reps   Extension AROM;15 reps   Row AROM;15 reps   Shoulder Exercises: ROM/Strengthening   Proximal Shoulder Strengthening, Supine 12X  Left UE held at 25% range   Manual Therapy   Manual Therapy Myofascial release   Manual therapy comments manual therapy completed seperately from all other interventions this date   Myofascial Release myofascial release and manual stretching to left upper arm, scapular, and shoulder region to decrease pain and restrictions and improve pain free mobility in her left shoulder region                OT Education - 05/12/15 1242    Education provided Yes   Education Details Reviewed therapy goals and progress.    Person(s) Educated Patient   Methods Explanation   Comprehension Verbalized understanding          OT Short Term Goals - 05/12/15 1124    OT SHORT TERM GOAL #1   Title Pt will be educated and independent with HEP to increase functional use of LUE during daily tasks.    Time 4   Period Weeks   Status Achieved   OT SHORT TERM GOAL #2   Title Patient will increase P/ROM to WNL to increase ability to complete reaching activities below shoulder height.    Time 4   Period Weeks   Status Achieved   OT SHORT TERM GOAL #3   Title Patient will increase LUE  strength to 3+/5 to increase ability to pull pants up over hips.    Baseline Achieved in the sense that patient is able to pull her pants up over her hips easier. Her MMT measurements are not at a 3+/5   Time 4   Period Weeks   Status Achieved   OT SHORT TERM GOAL #4   Title Patient will decrease fascial restrictions to min amount to increase functional mobility in LUE.    Time 4   Period  Weeks   Status Achieved   OT SHORT TERM GOAL #5   Title Patient will decrease pain level to 3/10 or less when using LUE during daily tasks.    Time 4   Period Weeks   Status Achieved           OT Long Term Goals - 06/11/2015 1127    OT LONG TERM GOAL #1   Title Patient will return to highest level of independence with LUE during daily tasks.    Time 8   Period Weeks   Status On-going   OT LONG TERM GOAL #2   Title Patient will increase A/ROM to [facility] to increase ability to use LUE more during daily tasks.    Time 8   Period Weeks   Status On-going   OT LONG TERM GOAL #3   Title Patient will increase LUE strength to 4/5 to increase ability to complete lightweight household tasks.    Time 8   Period Weeks   Status On-going   OT LONG TERM GOAL #4   Title Patient will decrease fascial restrictions to trace amount in LUE to increase functional mobility during daily tasks.    Time 8   Period Weeks   Status On-going   OT LONG TERM GOAL #5   Title Patient will decrease pain level in LUE to 1/10 or less with use during daily tasks.    Time 8   Period Weeks   Status On-going               Plan - 06-11-2015 1243    Clinical Impression Statement A: Mini reassessment has been completed this date. patient has met all short term goals and is progressing towards long terms goals. Patient is unable to complete any active ROM with LUE against gravity and relies on active assisted ROM. Pt reports that she is able to open and close doors as well as turn the faucet with her left hand now which she was  unable to do before therapy.    Plan P: Continue therapy 2x a week for 4 more weeks focusing on scapular strengthening and increasing functional use of LUE during daily tasks.    Consulted and Agree with Plan of Care Patient          G-Codes - 2015-06-11 1245    Functional Assessment Tool Used FOTO score: 38/100 (62% impaired)   Functional Limitation Carrying, moving and handling objects   Carrying, Moving and Handling Objects Current Status (B3794) At least 60 percent but less than 80 percent impaired, limited or restricted   Carrying, Moving and Handling Objects Goal Status (F2761) At least 20 percent but less than 40 percent impaired, limited or restricted      Problem List Patient Active Problem List   Diagnosis Date Noted  . Osteoarthritis of spine with myelopathy, cervical region 11/17/2014  . Failed total left knee replacement ([place]) 08/10/2013  . Pulmonary nodules/lesions, multiple 08/09/2013  . HLD (hyperlipidemia) 01/22/2012  . Pulmonary hypertension ([place]) 01/08/2012  . Acute on chronic diastolic heart failure ([place]) 01/05/2012  . Acute respiratory failure with hypoxia ([place]) 01/03/2012  . Pulmonary edema, acute ([place]) 01/03/2012  . 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 HTN (hypertension) 01/02/2012  . COPD with acute exacerbation ([place]) 01/02/2012  . Tobacco abuse 07/04/2011  . 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 Leukocytosis 02/09/2011  . Normocytic anemia 02/09/2011  . Mild Thrombocytosis 02/09/2011  . COPD (chronic obstructive pulmonary disease) (  [place]) 02/09/2011  . Obesity 02/09/2011    Ailene Ravel, OTR/L,CBIS  [phone]  05/12/2015, 12:46 PM  [facility] [address] [place], [place], [address] Phone: [phone]   Fax:  [phone]  Name: Debbie Mullins MRN: [id number] Date of Birth: March 11, 1956

## 2015-05-17 ENCOUNTER — Encounter (HOSPITAL_COMMUNITY): Payer: Medicare Other

## 2015-05-19 ENCOUNTER — Encounter (HOSPITAL_COMMUNITY): Payer: Self-pay

## 2015-05-19 ENCOUNTER — Ambulatory Visit (HOSPITAL_COMMUNITY): Payer: Medicare Other

## 2015-05-19 DIAGNOSIS — M25612 Stiffness of left shoulder, not elsewhere classified: Secondary | ICD-10-CM | POA: Diagnosis not present

## 2015-05-19 DIAGNOSIS — R29898 Other symptoms and signs involving the musculoskeletal system: Secondary | ICD-10-CM

## 2015-05-19 DIAGNOSIS — M629 Disorder of muscle, unspecified: Secondary | ICD-10-CM

## 2015-05-19 DIAGNOSIS — M6289 Other specified disorders of muscle: Secondary | ICD-10-CM

## 2015-05-19 DIAGNOSIS — M25512 Pain in left shoulder: Secondary | ICD-10-CM | POA: Diagnosis not present

## 2015-05-19 NOTE — Therapy (Signed)
[place], [place], [address] Phone: [phone]   Fax:  [phone]  Occupational Therapy Treatment  Patient Details  Name: Debbie Mullins MRN: [id number] Date of Birth: 07-Oct-1955 Referring Provider: Dr. Lynda Rainwater  Encounter Date: 05/19/2015      OT End of Session - 05/19/15 1441    Visit Number 10   Number of Visits 16   Date for OT Re-Evaluation 06/14/15   Authorization Type [employer] Medicare PPO - $40.00 copay   Authorization Time Period before 19th visit   Authorization - Visit Number 10   Authorization - Number of Visits 19   OT Start Time 1105   OT Stop Time 1145   OT Time Calculation (min) 40 min   Activity Tolerance Patient tolerated treatment well   Behavior During Therapy [facility] for tasks assessed/performed      Past Medical History  Diagnosis Date  . Anemia   . Leukocytosis 02/09/2011  . Normocytic anemia 02/09/2011  . Thrombocytosis ([place]) 02/09/2011  . COPD (chronic obstructive pulmonary disease) (HCC)     Albuterol inhaler prn;SIngulair at night  . Depression     takes Wellbutrin daily  . GERD (gastroesophageal reflux disease)     takes Nexium daily  . Hypertension     takes Lisinopril daily  . Diabetes mellitus     takes Metformin daily  . Asthma   . Pneumonia     many yrs ago  . History of colon polyps     benign  . Urinary frequency   . Urinary urgency   . Peripheral edema     takes Furosemide daily  . CHF (congestive heart failure) (HCC)     takes Furosemide daily   . Pulmonary nodules/lesions, multiple 08/09/2013  . Shortness of breath dyspnea     with exertion  . Weakness     numbness and tingling in both hands  . Joint pain   . Joint swelling   . Neck pain     HNP    Past Surgical History  Procedure Laterality Date  . Total knee arthroplasty Bilateral   . Tonsillectomy      as a child  . Colonoscopy    . Knee arthroplasty Left 08/10/2013    Procedure:   TOTAL KNEE ARTHROPLASTY;  Surgeon: [name], MD;  Location: [facility];  Service: Orthopedics;  Laterality: Left;  Left Total Knee Arthroplasty Revision, Cemented, Semi-constrained,   . Left and right heart catheterization with coronary angiogram N/A 01/07/2012    Procedure: LEFT AND RIGHT HEART CATHETERIZATION WITH CORONARY ANGIOGRAM;  Surgeon: Thayer Headings, MD;  Location: [facility] CATH LAB;  Service: Cardiovascular;  Laterality: N/A;  . Anterior cervical decomp/discectomy fusion N/A 11/17/2014    Procedure: Cervical four- cervical five Anterior Cervical Decompression with fusion and bonegraft;  Surgeon: Ashok Pall, MD;  Location: [place];  Service: Neurosurgery;  Laterality: N/A;  C45 anterior cervical decompression with fusion plating and bonegraft    There were no vitals filed for this visit.  Visit Diagnosis:  Shoulder weakness  Tight fascia  Shoulder stiffness, left      Subjective Assessment - 05/19/15 1337    Subjective  S: I've been doing my exercises with a broom stick.   Currently in Pain? No/denies            [facility] OT Assessment - 05/19/15 1128    Assessment   Diagnosis Left rotator cuff tear   Precautions  Precautions None                  OT Treatments/Exercises (OP) - 05/19/15 1128    Exercises   Exercises Shoulder   Shoulder Exercises: Supine   Protraction PROM;5 reps;AROM;10 reps   Horizontal ABduction PROM;5 reps;AAROM;12 reps   External Rotation PROM;5 reps;AAROM;12 reps   Internal Rotation PROM;5 reps;AAROM;12 reps   Flexion PROM;5 reps;AAROM;12 reps   ABduction PROM;5 reps;AAROM;12 reps   ABduction Limitations Only able to complete 25% range.   Shoulder Exercises: Seated   Extension Theraband;12 reps   Theraband Level (Shoulder Extension) Level 2 (Red)   Row Theraband;12 reps   Theraband Level (Shoulder Row) Level 2 (Red)   Protraction AAROM;10 reps   Horizontal ABduction AAROM;10 reps   Horizontal ABduction Limitations 25% range    External Rotation AAROM;10 reps   Internal Rotation AAROM;10 reps   Flexion AAROM;10 reps   Flexion Limitations 25% range   Abduction AAROM;10 reps;Limitations   ABduction Limitations 25% range   Shoulder Exercises: ROM/Strengthening   UBE (Upper Arm Bike) Level 1 2' forward 2' reverse   Manual Therapy   Manual Therapy Myofascial release   Manual therapy comments manual therapy completed seperately from all other interventions this date   Myofascial Release myofascial release and manual stretching to left upper arm, scapular, and shoulder region to decrease pain and restrictions and improve pain free mobility in her left shoulder region                  OT Short Term Goals - 05/12/15 1124    OT SHORT TERM GOAL #1   Title Pt will be educated and independent with HEP to increase functional use of LUE during daily tasks.    Time 4   Period Weeks   Status Achieved   OT SHORT TERM GOAL #2   Title Patient will increase P/ROM to WNL to increase ability to complete reaching activities below shoulder height.    Time 4   Period Weeks   Status Achieved   OT SHORT TERM GOAL #3   Title Patient will increase LUE strength to 3+/5 to increase ability to pull pants up over hips.    Baseline Achieved in the sense that patient is able to pull her pants up over her hips easier. Her MMT measurements are not at a 3+/5   Time 4   Period Weeks   Status Achieved   OT SHORT TERM GOAL #4   Title Patient will decrease fascial restrictions to min amount to increase functional mobility in LUE.    Time 4   Period Weeks   Status Achieved   OT SHORT TERM GOAL #5   Title Patient will decrease pain level to 3/10 or less when using LUE during daily tasks.    Time 4   Period Weeks   Status Achieved           OT Long Term Goals - 05/12/15 1127    OT LONG TERM GOAL #1   Title Patient will return to highest level of independence with LUE during daily tasks.    Time 8   Period Weeks   Status  On-going   OT LONG TERM GOAL #2   Title Patient will increase A/ROM to [facility] to increase ability to use LUE more during daily tasks.    Time 8   Period Weeks   Status On-going   OT LONG TERM GOAL #3   Title Patient will increase  LUE strength to 4/5 to increase ability to complete lightweight household tasks.    Time 8   Period Weeks   Status On-going   OT LONG TERM GOAL #4   Title Patient will decrease fascial restrictions to trace amount in LUE to increase functional mobility during daily tasks.    Time 8   Period Weeks   Status On-going   OT LONG TERM GOAL #5   Title Patient will decrease pain level in LUE to 1/10 or less with use during daily tasks.    Time 8   Period Weeks   Status On-going               Plan - 05/19/15 1442    Clinical Impression Statement A: COntinued with scapuarl strengthening exercises. Was able to complete supine shoulder protraction although was limited with ROM.    Plan P: Increase AA/ROM supine to 15X. Continue with proximal shoulder strengthening.         Problem List Patient Active Problem List   Diagnosis Date Noted  . Osteoarthritis of spine with myelopathy, cervical region 11/17/2014  . Failed total left knee replacement ([place]) 08/10/2013  . Pulmonary nodules/lesions, multiple 08/09/2013  . HLD (hyperlipidemia) 01/22/2012  . Pulmonary hypertension ([place]) 01/08/2012  . Acute on chronic diastolic heart failure ([place]) 01/05/2012  . Acute respiratory failure with hypoxia ([place]) 01/03/2012  . Pulmonary edema, acute ([place]) 01/03/2012  . 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 HTN (hypertension) 01/02/2012  . COPD with acute exacerbation ([place]) 01/02/2012  . Tobacco abuse 07/04/2011  . 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 Leukocytosis 02/09/2011  . Normocytic anemia 02/09/2011  . Mild Thrombocytosis 02/09/2011  . COPD (chronic obstructive pulmonary disease) ([place]) 02/09/2011  . Obesity 02/09/2011    Ailene Ravel,  OTR/L,CBIS  [phone]  05/19/2015, 2:46 PM  [facility] [address] [place], [place], [address] Phone: [phone]   Fax:  [phone]  Name: Debbie Mullins MRN: [id number] Date of Birth: 24-Aug-1955

## 2015-05-23 ENCOUNTER — Ambulatory Visit (HOSPITAL_COMMUNITY): Payer: Medicare Other | Admitting: Specialist

## 2015-05-23 DIAGNOSIS — R29898 Other symptoms and signs involving the musculoskeletal system: Secondary | ICD-10-CM

## 2015-05-23 DIAGNOSIS — M25512 Pain in left shoulder: Secondary | ICD-10-CM

## 2015-05-23 DIAGNOSIS — M629 Disorder of muscle, unspecified: Secondary | ICD-10-CM | POA: Diagnosis not present

## 2015-05-23 DIAGNOSIS — M25612 Stiffness of left shoulder, not elsewhere classified: Secondary | ICD-10-CM | POA: Diagnosis not present

## 2015-05-23 NOTE — Therapy (Signed)
[facility] [place], [place], [address] Phone: [phone]   Fax:  [phone]  Occupational Therapy Treatment  Patient Details  Name: Debbie Mullins MRN: [id number] Date of Birth: 08-09-1955 Referring Provider: Dr. Lynda Rainwater  Encounter Date: 05/23/2015      OT End of Session - 05/23/15 1618    Visit Number 11   Number of Visits 16   Date for OT Re-Evaluation 06/14/15   Authorization Type [employer] Medicare PPO - $40.00 copay   Authorization Time Period before 19th visit   Authorization - Visit Number 11   Authorization - Number of Visits 19   OT Start Time [id number]   OT Stop Time 1604   OT Time Calculation (min) 41 min   Activity Tolerance Patient tolerated treatment well   Behavior During Therapy [facility] for tasks assessed/performed      Past Medical History  Diagnosis Date  . Anemia   . Leukocytosis 02/09/2011  . Normocytic anemia 02/09/2011  . Thrombocytosis ([place]) 02/09/2011  . COPD (chronic obstructive pulmonary disease) (HCC)     Albuterol inhaler prn;SIngulair at night  . Depression     takes Wellbutrin daily  . GERD (gastroesophageal reflux disease)     takes Nexium daily  . Hypertension     takes Lisinopril daily  . Diabetes mellitus     takes Metformin daily  . Asthma   . Pneumonia     many yrs ago  . History of colon polyps     benign  . Urinary frequency   . Urinary urgency   . Peripheral edema     takes Furosemide daily  . CHF (congestive heart failure) (HCC)     takes Furosemide daily   . Pulmonary nodules/lesions, multiple 08/09/2013  . Shortness of breath dyspnea     with exertion  . Weakness     numbness and tingling in both hands  . Joint pain   . Joint swelling   . Neck pain     HNP    Past Surgical History  Procedure Laterality Date  . Total knee arthroplasty Bilateral   . Tonsillectomy      as a child  . Colonoscopy    . Knee arthroplasty Left 08/10/2013    Procedure:   TOTAL KNEE ARTHROPLASTY;  Surgeon: [name], MD;  Location: [facility];  Service: Orthopedics;  Laterality: Left;  Left Total Knee Arthroplasty Revision, Cemented, Semi-constrained,   . Left and right heart catheterization with coronary angiogram N/A 01/07/2012    Procedure: LEFT AND RIGHT HEART CATHETERIZATION WITH CORONARY ANGIOGRAM;  Surgeon: Thayer Headings, MD;  Location: [facility] LAB;  Service: Cardiovascular;  Laterality: N/A;  . Anterior cervical decomp/discectomy fusion N/A 11/17/2014    Procedure: Cervical four- cervical five Anterior Cervical Decompression with fusion and bonegraft;  Surgeon: Ashok Pall, MD;  Location: [place] NEURO ORS;  Service: Neurosurgery;  Laterality: N/A;  C45 anterior cervical decompression with fusion plating and bonegraft    There were no vitals filed for this visit.  Visit Diagnosis:  Shoulder weakness  Pain in joint of left shoulder      Subjective Assessment - 05/23/15 1524    Subjective  S:  I dont have any pain today   Currently in Pain? No/denies   Pain Score 0-No pain            [facility] OT Assessment - 05/23/15 0001    Assessment   Diagnosis Left rotator cuff tear  Precautions   Precautions None                  OT Treatments/Exercises (OP) - 05/23/15 0001    Exercises   Exercises Shoulder   Shoulder Exercises: Supine   Protraction PROM;5 reps;AAROM;15 reps   Horizontal ABduction PROM;5 reps;AAROM;15 reps   External Rotation PROM;5 reps;AAROM;15 reps   Internal Rotation PROM;5 reps;AAROM;15 reps   Flexion PROM;5 reps;AAROM;15 reps   ABduction PROM;5 reps;AAROM;15 reps   Other Supine Exercises serratus anterior punch 11 times with min facilitation to maintain full extension in elbow    Shoulder Exercises: Seated   Protraction AAROM;10 reps   Protraction Limitations therapist unweighted arm with all AAROM exercises    Horizontal ABduction AAROM;10 reps   Horizontal ABduction Limitations 25% range   External Rotation  AAROM;10 reps   Internal Rotation AAROM;10 reps   Flexion AAROM;10 reps   Flexion Limitations 25% range   Abduction AAROM;10 reps;Limitations   ABduction Limitations 25% range   Shoulder Exercises: ROM/Strengthening   Proximal Shoulder Strengthening, Supine 10X with therapist unweighting arm and facilitating movement    Other ROM/Strengthening Exercises seated on mat completed PVC pipe slide into flexion and abduction 15 repetitions with approximately 75% range into flexion and 50% range into abduction difficult between 75 and 90 degrees of movement.    Manual Therapy   Manual Therapy Myofascial release   Manual therapy comments manual therapy completed seperately from all other interventions this date   Myofascial Release myofascial release and manual stretching to left upper arm, scapular, and shoulder region to decrease pain and restrictions and improve pain free mobility in her left shoulder region   Muscle Energy Technique --                  OT Short Term Goals - 05/12/15 1124    OT SHORT TERM GOAL #1   Title Pt will be educated and independent with HEP to increase functional use of LUE during daily tasks.    Time 4   Period Weeks   Status Achieved   OT SHORT TERM GOAL #2   Title Patient will increase P/ROM to WNL to increase ability to complete reaching activities below shoulder height.    Time 4   Period Weeks   Status Achieved   OT SHORT TERM GOAL #3   Title Patient will increase LUE strength to 3+/5 to increase ability to pull pants up over hips.    Baseline Achieved in the sense that patient is able to pull her pants up over her hips easier. Her MMT measurements are not at a 3+/5   Time 4   Period Weeks   Status Achieved   OT SHORT TERM GOAL #4   Title Patient will decrease fascial restrictions to min amount to increase functional mobility in LUE.    Time 4   Period Weeks   Status Achieved   OT SHORT TERM GOAL #5   Title Patient will decrease pain level to  3/10 or less when using LUE during daily tasks.    Time 4   Period Weeks   Status Achieved           OT Long Term Goals - 05/12/15 1127    OT LONG TERM GOAL #1   Title Patient will return to highest level of independence with LUE during daily tasks.    Time 8   Period Weeks   Status On-going   OT LONG TERM GOAL #2  Title Patient will increase A/ROM to [facility] to increase ability to use LUE more during daily tasks.    Time 8   Period Weeks   Status On-going   OT LONG TERM GOAL #3   Title Patient will increase LUE strength to 4/5 to increase ability to complete lightweight household tasks.    Time 8   Period Weeks   Status On-going   OT LONG TERM GOAL #4   Title Patient will decrease fascial restrictions to trace amount in LUE to increase functional mobility during daily tasks.    Time 8   Period Weeks   Status On-going   OT LONG TERM GOAL #5   Title Patient will decrease pain level in LUE to 1/10 or less with use during daily tasks.    Time 8   Period Weeks   Status On-going               Plan - 05/23/15 1619    Clinical Impression Statement A: patient requires max verbal guidance to depress shoulder blade when completing AA/ROM in seated.  Added pvc pipe slide for flexion and abduction in a seated position this date.  Patient able to improve self range of motion with this exercise   Plan P:  add functional reaching activity to improve functional use of left arm with functional activities.    Consulted and Agree with Plan of Care Patient        Problem List Patient Active Problem List   Diagnosis Date Noted  . Osteoarthritis of spine with myelopathy, cervical region 11/17/2014  . Failed total left knee replacement ([place]) 08/10/2013  . Pulmonary nodules/lesions, multiple 08/09/2013  . HLD (hyperlipidemia) 01/22/2012  . Pulmonary hypertension ([place]) 01/08/2012  . Acute on chronic diastolic heart failure ([place]) 01/05/2012  . Acute respiratory failure with hypoxia  ([place]) 01/03/2012  . Pulmonary edema, acute ([place]) 01/03/2012  . 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 HTN (hypertension) 01/02/2012  . COPD with acute exacerbation ([place]) 01/02/2012  . Tobacco abuse 07/04/2011  . 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 Leukocytosis 02/09/2011  . Normocytic anemia 02/09/2011  . Mild Thrombocytosis 02/09/2011  . COPD (chronic obstructive pulmonary disease) ([place]) 02/09/2011  . Obesity 02/09/2011    Vangie Bicker, OTR/L [phone]  05/23/2015, 4:21 PM  [facility] [address] [place], [place], [address] Phone: [phone]   Fax:  [phone]  Name: Debbie Mullins MRN: [id number] Date of Birth: 1955-12-17

## 2015-05-26 ENCOUNTER — Encounter (HOSPITAL_COMMUNITY): Payer: Self-pay

## 2015-05-26 ENCOUNTER — Ambulatory Visit (HOSPITAL_COMMUNITY): Payer: Medicare Other

## 2015-05-26 DIAGNOSIS — R29898 Other symptoms and signs involving the musculoskeletal system: Secondary | ICD-10-CM

## 2015-05-26 DIAGNOSIS — M629 Disorder of muscle, unspecified: Secondary | ICD-10-CM | POA: Diagnosis not present

## 2015-05-26 DIAGNOSIS — M6289 Other specified disorders of muscle: Secondary | ICD-10-CM

## 2015-05-26 DIAGNOSIS — M25512 Pain in left shoulder: Secondary | ICD-10-CM

## 2015-05-26 DIAGNOSIS — M25612 Stiffness of left shoulder, not elsewhere classified: Secondary | ICD-10-CM | POA: Diagnosis not present

## 2015-05-26 NOTE — Therapy (Signed)
[place], [place], [address] Phone: [phone]   Fax:  [phone]  Occupational Therapy Treatment  Patient Details  Name: Debbie Mullins MRN: [id number] Date of Birth: 1956-01-06 Referring Provider: Dr. Lynda Rainwater  Encounter Date: 05/26/2015      OT End of Session - 05/26/15 1716    Visit Number 12   Number of Visits 16   Date for OT Re-Evaluation 06/14/15   Authorization Type [employer] Medicare PPO - $40.00 copay   Authorization Time Period before 19th visit   Authorization - Visit Number 12   Authorization - Number of Visits 19   OT Start Time 1350   OT Stop Time 1430   OT Time Calculation (min) 40 min   Activity Tolerance Patient tolerated treatment well   Behavior During Therapy [facility] for tasks assessed/performed      Past Medical History  Diagnosis Date  . Anemia   . Leukocytosis 02/09/2011  . Normocytic anemia 02/09/2011  . Thrombocytosis ([place]) 02/09/2011  . COPD (chronic obstructive pulmonary disease) (HCC)     Albuterol inhaler prn;SIngulair at night  . Depression     takes Wellbutrin daily  . GERD (gastroesophageal reflux disease)     takes Nexium daily  . Hypertension     takes Lisinopril daily  . Diabetes mellitus     takes Metformin daily  . Asthma   . Pneumonia     many yrs ago  . History of colon polyps     benign  . Urinary frequency   . Urinary urgency   . Peripheral edema     takes Furosemide daily  . CHF (congestive heart failure) (HCC)     takes Furosemide daily   . Pulmonary nodules/lesions, multiple 08/09/2013  . Shortness of breath dyspnea     with exertion  . Weakness     numbness and tingling in both hands  . Joint pain   . Joint swelling   . Neck pain     HNP    Past Surgical History  Procedure Laterality Date  . Total knee arthroplasty Bilateral   . Tonsillectomy      as a child  . Colonoscopy    . Knee arthroplasty Left 08/10/2013    Procedure:   TOTAL KNEE ARTHROPLASTY;  Surgeon: [name], MD;  Location: [place] [facility];  Service: Orthopedics;  Laterality: Left;  Left Total Knee Arthroplasty Revision, Cemented, Semi-constrained,   . Left and right heart catheterization with coronary angiogram N/A 01/07/2012    Procedure: LEFT AND RIGHT HEART CATHETERIZATION WITH CORONARY ANGIOGRAM;  Surgeon: Thayer Headings, MD;  Location: [facility] CATH LAB;  Service: Cardiovascular;  Laterality: N/A;  . Anterior cervical decomp/discectomy fusion N/A 11/17/2014    Procedure: Cervical four- cervical five Anterior Cervical Decompression with fusion and bonegraft;  Surgeon: Ashok Pall, MD;  Location: [place] NEURO ORS;  Service: Neurosurgery;  Laterality: N/A;  C45 anterior cervical decompression with fusion plating and bonegraft    There were no vitals filed for this visit.  Visit Diagnosis:  Shoulder weakness  Pain in joint of left shoulder  Tight fascia  Shoulder stiffness, left      Subjective Assessment - 05/26/15 1407    Subjective  S: My arm doesn't hurt today.   Currently in Pain? No/denies            [facility] OT Assessment - 05/26/15 1405    Assessment   Diagnosis Left rotator cuff tear  Precautions   Precautions None                  OT Treatments/Exercises (OP) - 05/26/15 1405    Exercises   Exercises Shoulder   Shoulder Exercises: Supine   Protraction PROM;5 reps;AROM;10 reps   Horizontal ABduction PROM;5 reps;AAROM;15 reps   External Rotation PROM;5 reps;AAROM;15 reps   Internal Rotation PROM;5 reps;AAROM;15 reps   Flexion PROM;5 reps;AAROM;15 reps   ABduction PROM;5 reps;AAROM;15 reps   ABduction Limitations Only able to complete 25% range.   Other Supine Exercises serratus anterior punch 15 times with min facilitation to maintain full extension in elbow    Shoulder Exercises: Seated   Protraction AAROM;10 reps   Protraction Limitations therapist unweighted arm with all AAROM exercises    Horizontal ABduction AAROM;10  reps   Horizontal ABduction Limitations 25% range   External Rotation AAROM;10 reps   Internal Rotation AAROM;10 reps   Flexion AAROM;10 reps   Flexion Limitations 25% range   Abduction AAROM;10 reps;Limitations   ABduction Limitations 25% range   Functional Reaching Activities   Mid Level Patient completed a functional reaching task of placing yellow and red  clothespins. Pt required assistance from therapist to off loading weight of LUE. Patient then removed clothespins with assistance to off load LUE as well.    Manual Therapy   Manual Therapy Myofascial release   Manual therapy comments manual therapy completed seperately from all other interventions this date   Myofascial Release myofascial release and manual stretching to left upper arm, scapular, and shoulder region to decrease pain and restrictions and improve pain free mobility in her left shoulder region                  OT Short Term Goals - 05/12/15 1124    OT SHORT TERM GOAL #1   Title Pt will be educated and independent with HEP to increase functional use of LUE during daily tasks.    Time 4   Period Weeks   Status Achieved   OT SHORT TERM GOAL #2   Title Patient will increase P/ROM to WNL to increase ability to complete reaching activities below shoulder height.    Time 4   Period Weeks   Status Achieved   OT SHORT TERM GOAL #3   Title Patient will increase LUE strength to 3+/5 to increase ability to pull pants up over hips.    Baseline Achieved in the sense that patient is able to pull her pants up over her hips easier. Her MMT measurements are not at a 3+/5   Time 4   Period Weeks   Status Achieved   OT SHORT TERM GOAL #4   Title Patient will decrease fascial restrictions to min amount to increase functional mobility in LUE.    Time 4   Period Weeks   Status Achieved   OT SHORT TERM GOAL #5   Title Patient will decrease pain level to 3/10 or less when using LUE during daily tasks.    Time 4    Period Weeks   Status Achieved           OT Long Term Goals - 05/12/15 1127    OT LONG TERM GOAL #1   Title Patient will return to highest level of independence with LUE during daily tasks.    Time 8   Period Weeks   Status On-going   OT LONG TERM GOAL #2   Title Patient will increase A/ROM to [facility]  to increase ability to use LUE more during daily tasks.    Time 8   Period Weeks   Status On-going   OT LONG TERM GOAL #3   Title Patient will increase LUE strength to 4/5 to increase ability to complete lightweight household tasks.    Time 8   Period Weeks   Status On-going   OT LONG TERM GOAL #4   Title Patient will decrease fascial restrictions to trace amount in LUE to increase functional mobility during daily tasks.    Time 8   Period Weeks   Status On-going   OT LONG TERM GOAL #5   Title Patient will decrease pain level in LUE to 1/10 or less with use during daily tasks.    Time 8   Period Weeks   Status On-going               Plan - 05/26/15 1718    Clinical Impression Statement A: Added functional reaching task with clothespins which was increasingly difficult as patient required assistance from therapist to off load weight from LUE to complete.    Plan P: Continue to work on functional reaching task with clothespins and with PVC piping.         Problem List Patient Active Problem List   Diagnosis Date Noted  . Osteoarthritis of spine with myelopathy, cervical region 11/17/2014  . Failed total left knee replacement ([place]) 08/10/2013  . Pulmonary nodules/lesions, multiple 08/09/2013  . HLD (hyperlipidemia) 01/22/2012  . Pulmonary hypertension ([place]) 01/08/2012  . Acute on chronic diastolic heart failure ([place]) 01/05/2012  . Acute respiratory failure with hypoxia ([place]) 01/03/2012  . Pulmonary edema, acute ([place]) 01/03/2012  . 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 HTN (hypertension) 01/02/2012  . COPD with acute exacerbation ([place]) 01/02/2012  .  Tobacco abuse 07/04/2011  . 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 Leukocytosis 02/09/2011  . Normocytic anemia 02/09/2011  . Mild Thrombocytosis 02/09/2011  . COPD (chronic obstructive pulmonary disease) ([place]) 02/09/2011  . Obesity 02/09/2011    Ailene Ravel, OTR/L,CBIS  [phone]  05/26/2015, 5:20 PM  [facility] [address] [place], [place], [address] Phone: [phone]   Fax:  [phone]  Name: Debbie Mullins MRN: [id number] Date of Birth: 12/29/1955

## 2015-05-31 ENCOUNTER — Encounter (HOSPITAL_COMMUNITY): Payer: Self-pay | Admitting: Occupational Therapy

## 2015-05-31 ENCOUNTER — Ambulatory Visit (HOSPITAL_COMMUNITY): Payer: Medicare Other | Admitting: Occupational Therapy

## 2015-05-31 DIAGNOSIS — M6289 Other specified disorders of muscle: Secondary | ICD-10-CM

## 2015-05-31 DIAGNOSIS — M25612 Stiffness of left shoulder, not elsewhere classified: Secondary | ICD-10-CM | POA: Diagnosis not present

## 2015-05-31 DIAGNOSIS — M25512 Pain in left shoulder: Secondary | ICD-10-CM | POA: Diagnosis not present

## 2015-05-31 DIAGNOSIS — M629 Disorder of muscle, unspecified: Secondary | ICD-10-CM

## 2015-05-31 DIAGNOSIS — R29898 Other symptoms and signs involving the musculoskeletal system: Secondary | ICD-10-CM | POA: Diagnosis not present

## 2015-05-31 NOTE — Therapy (Signed)
[place], [place], [address] Phone: [phone]   Fax:  [phone]  Occupational Therapy Treatment  Patient Details  Name: Debbie Mullins MRN: [id number] Date of Birth: 20-Sep-1955 Referring Provider: Dr. Lynda Rainwater  Encounter Date: 05/31/2015      OT End of Session - 05/31/15 1148    Visit Number 13   Number of Visits 16   Date for OT Re-Evaluation 06/14/15   Authorization Type [employer] Medicare PPO - $40.00 copay   Authorization Time Period before 19th visit   Authorization - Visit Number 13   Authorization - Number of Visits 19   OT Start Time 1100   OT Stop Time 1143   OT Time Calculation (min) 43 min   Activity Tolerance Patient tolerated treatment well   Behavior During Therapy [facility] for tasks assessed/performed      Past Medical History  Diagnosis Date  . Anemia   . Leukocytosis 02/09/2011  . Normocytic anemia 02/09/2011  . Thrombocytosis ([place]) 02/09/2011  . COPD (chronic obstructive pulmonary disease) (HCC)     Albuterol inhaler prn;SIngulair at night  . Depression     takes Wellbutrin daily  . GERD (gastroesophageal reflux disease)     takes Nexium daily  . Hypertension     takes Lisinopril daily  . Diabetes mellitus     takes Metformin daily  . Asthma   . Pneumonia     many yrs ago  . History of colon polyps     benign  . Urinary frequency   . Urinary urgency   . Peripheral edema     takes Furosemide daily  . CHF (congestive heart failure) (HCC)     takes Furosemide daily   . Pulmonary nodules/lesions, multiple 08/09/2013  . Shortness of breath dyspnea     with exertion  . Weakness     numbness and tingling in both hands  . Joint pain   . Joint swelling   . Neck pain     HNP    Past Surgical History  Procedure Laterality Date  . Total knee arthroplasty Bilateral   . Tonsillectomy      as a child  . Colonoscopy    . Knee arthroplasty Left 08/10/2013    Procedure:   TOTAL KNEE ARTHROPLASTY;  Surgeon: [name], MD;  Location: [facility];  Service: Orthopedics;  Laterality: Left;  Left Total Knee Arthroplasty Revision, Cemented, Semi-constrained,   . Left and right heart catheterization with coronary angiogram N/A 01/07/2012    Procedure: LEFT AND RIGHT HEART CATHETERIZATION WITH CORONARY ANGIOGRAM;  Surgeon: Thayer Headings, MD;  Location: [facility] CATH LAB;  Service: Cardiovascular;  Laterality: N/A;  . Anterior cervical decomp/discectomy fusion N/A 11/17/2014    Procedure: Cervical four- cervical five Anterior Cervical Decompression with fusion and bonegraft;  Surgeon: Ashok Pall, MD;  Location: [facility];  Service: Neurosurgery;  Laterality: N/A;  C45 anterior cervical decompression with fusion plating and bonegraft    There were no vitals filed for this visit.  Visit Diagnosis:  Shoulder weakness  Pain in joint of left shoulder  Tight fascia  Shoulder stiffness, left      Subjective Assessment - 05/31/15 1058    Subjective  S: My arm began hurting some this morning.    Currently in Pain? Yes   Pain Score 3    Pain Location Shoulder   Pain Orientation Left   Pain Descriptors / Indicators Sore   Pain  Type Acute pain   Pain Radiating Towards n/a   Pain Onset Today   Pain Frequency Occasional   Aggravating Factors  use   Pain Relieving Factors rest   Effect of Pain on Daily Activities unable to sleep on right side consistently   Multiple Pain Sites No            [facility] OT Assessment - 05/31/15 1056    Assessment   Diagnosis Left rotator cuff tear   Precautions   Precautions None                  OT Treatments/Exercises (OP) - 05/31/15 1104    Exercises   Exercises Shoulder   Shoulder Exercises: Supine   Protraction PROM;5 reps;AROM;10 reps   Horizontal ABduction PROM;5 reps;AAROM;15 reps   External Rotation PROM;5 reps;AROM;15 reps   Internal Rotation PROM;5 reps;AROM;15 reps   Flexion PROM;5 reps;AAROM;15 reps    ABduction PROM;5 reps;AAROM;15 reps   ABduction Limitations Only able to complete 25% range.   Other Supine Exercises serratus anterior punch 15 times with min facilitation to maintain full extension in elbow    Shoulder Exercises: Seated   Protraction AAROM;10 reps   Protraction Limitations therapist unweighted arm with all AAROM exercises    Horizontal ABduction AAROM;10 reps   Horizontal ABduction Limitations 25% range   External Rotation AAROM;10 reps   Internal Rotation AAROM;10 reps   Flexion AAROM;10 reps   Flexion Limitations 25% range   Abduction AAROM;10 reps;Limitations   ABduction Limitations 25% range   Shoulder Exercises: Pulleys   Flexion 1 minute   ABduction 1 minute   ABduction Limitations with therapist facilitating correct movement   Shoulder Exercises: ROM/Strengthening   Other ROM/Strengthening Exercises seated in chair, pt completed PVC pipe slide into flexion and abduction 15 repetitions with approximately 50% range into flexion and 50% range into abduction difficult between 75 and 90 degrees of movement.    Manual Therapy   Manual Therapy Myofascial release   Manual therapy comments manual therapy completed seperately from all other interventions this date   Myofascial Release myofascial release and manual stretching to left upper arm, scapular, and shoulder region to decrease pain and restrictions and improve pain free mobility in her left shoulder region                  OT Short Term Goals - 05/12/15 1124    OT SHORT TERM GOAL #1   Title Pt will be educated and independent with HEP to increase functional use of LUE during daily tasks.    Time 4   Period Weeks   Status Achieved   OT SHORT TERM GOAL #2   Title Patient will increase P/ROM to WNL to increase ability to complete reaching activities below shoulder height.    Time 4   Period Weeks   Status Achieved   OT SHORT TERM GOAL #3   Title Patient will increase LUE strength to 3+/5 to  increase ability to pull pants up over hips.    Baseline Achieved in the sense that patient is able to pull her pants up over her hips easier. Her MMT measurements are not at a 3+/5   Time 4   Period Weeks   Status Achieved   OT SHORT TERM GOAL #4   Title Patient will decrease fascial restrictions to min amount to increase functional mobility in LUE.    Time 4   Period Weeks   Status Achieved   OT SHORT  TERM GOAL #5   Title Patient will decrease pain level to 3/10 or less when using LUE during daily tasks.    Time 4   Period Weeks   Status Achieved           OT Long Term Goals - 05/12/15 1127    OT LONG TERM GOAL #1   Title Patient will return to highest level of independence with LUE during daily tasks.    Time 8   Period Weeks   Status On-going   OT LONG TERM GOAL #2   Title Patient will increase A/ROM to [facility] to increase ability to use LUE more during daily tasks.    Time 8   Period Weeks   Status On-going   OT LONG TERM GOAL #3   Title Patient will increase LUE strength to 4/5 to increase ability to complete lightweight household tasks.    Time 8   Period Weeks   Status On-going   OT LONG TERM GOAL #4   Title Patient will decrease fascial restrictions to trace amount in LUE to increase functional mobility during daily tasks.    Time 8   Period Weeks   Status On-going   OT LONG TERM GOAL #5   Title Patient will decrease pain level in LUE to 1/10 or less with use during daily tasks.    Time 8   Period Weeks   Status On-going               Plan - 05/31/15 1148    Clinical Impression Statement A: Resumed PVC pipe functional reaching task, pt with difficulty reaching previous range today. Pt required therapist assist to unweight arm to complete seated AA/ROM and PVC pipe exercise. Did not complete clothespin task due to time constraints.    Plan P: continue functional reaching task wtih clothespins        Problem List Patient Active Problem List    Diagnosis Date Noted  . Osteoarthritis of spine with myelopathy, cervical region 11/17/2014  . Failed total left knee replacement ([place]) 08/10/2013  . Pulmonary nodules/lesions, multiple 08/09/2013  . HLD (hyperlipidemia) 01/22/2012  . Pulmonary hypertension ([place]) 01/08/2012  . Acute on chronic diastolic heart failure ([place]) 01/05/2012  . Acute respiratory failure with hypoxia ([place]) 01/03/2012  . Pulmonary edema, acute ([place]) 01/03/2012  . 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 HTN (hypertension) 01/02/2012  . COPD with acute exacerbation ([place]) 01/02/2012  . Tobacco abuse 07/04/2011  . 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 Leukocytosis 02/09/2011  . Normocytic anemia 02/09/2011  . Mild Thrombocytosis 02/09/2011  . COPD (chronic obstructive pulmonary disease) ([place]) 02/09/2011  . Obesity 02/09/2011    Guadelupe Sabin, OTR/L  [phone]  05/31/2015, 11:53 AM  [place] [address] [place], [place], [address] Phone: [phone]   Fax:  [phone]  Name: Debbie Mullins MRN: [id number] Date of Birth: Oct 29, 1955

## 2015-06-03 ENCOUNTER — Encounter (HOSPITAL_COMMUNITY): Payer: Medicare Other

## 2015-06-07 ENCOUNTER — Encounter (HOSPITAL_COMMUNITY): Payer: Self-pay

## 2015-06-07 ENCOUNTER — Ambulatory Visit (HOSPITAL_COMMUNITY): Payer: Medicare Other

## 2015-06-07 DIAGNOSIS — R29898 Other symptoms and signs involving the musculoskeletal system: Secondary | ICD-10-CM | POA: Diagnosis not present

## 2015-06-07 DIAGNOSIS — M25612 Stiffness of left shoulder, not elsewhere classified: Secondary | ICD-10-CM | POA: Diagnosis not present

## 2015-06-07 DIAGNOSIS — M629 Disorder of muscle, unspecified: Secondary | ICD-10-CM | POA: Diagnosis not present

## 2015-06-07 DIAGNOSIS — M25512 Pain in left shoulder: Secondary | ICD-10-CM | POA: Diagnosis not present

## 2015-06-07 NOTE — Therapy (Addendum)
[place], [place], [address] Phone: [phone]   Fax:  [phone]  Occupational Therapy Treatment  Patient Details  Name: Debbie Mullins MRN: [id number] Date of Birth: 27-Jun-1955 Referring Provider: Dr. Lynda Rainwater  Encounter Date: 06/07/2015     06/07/15 1207  OT Visits / Re-Eval  Visit Number 14  Number of Visits 16  Date for OT Re-Evaluation 06/14/15  Authorization  Authorization Type [employer] Medicare PPO - $40.00 copay  Authorization Time Period before 19th visit  Authorization - Visit Number 14  Authorization - Number of Visits 19  OT Time Calculation  OT Start Time 1100  OT Stop Time 1143  OT Time Calculation (min) 43 min  End of Session  Activity Tolerance Patient tolerated treatment well  Behavior During Therapy [facility] for tasks assessed/performed    Past Medical History  Diagnosis Date  . Anemia   . Leukocytosis 02/09/2011  . Normocytic anemia 02/09/2011  . Thrombocytosis ([place]) 02/09/2011  . COPD (chronic obstructive pulmonary disease) (HCC)     Albuterol inhaler prn;SIngulair at night  . Depression     takes Wellbutrin daily  . GERD (gastroesophageal reflux disease)     takes Nexium daily  . Hypertension     takes Lisinopril daily  . Diabetes mellitus     takes Metformin daily  . Asthma   . Pneumonia     many yrs ago  . History of colon polyps     benign  . Urinary frequency   . Urinary urgency   . Peripheral edema     takes Furosemide daily  . CHF (congestive heart failure) (HCC)     takes Furosemide daily   . Pulmonary nodules/lesions, multiple 08/09/2013  . Shortness of breath dyspnea     with exertion  . Weakness     numbness and tingling in both hands  . Joint pain   . Joint swelling   . Neck pain     HNP    Past Surgical History  Procedure Laterality Date  . Total knee arthroplasty Bilateral   . Tonsillectomy      as a child  . Colonoscopy    . Knee  arthroplasty Left 08/10/2013    Procedure:  TOTAL KNEE ARTHROPLASTY;  Surgeon: [name], MD;  Location: [facility];  Service: Orthopedics;  Laterality: Left;  Left Total Knee Arthroplasty Revision, Cemented, Semi-constrained,   . Left and right heart catheterization with coronary angiogram N/A 01/07/2012    Procedure: LEFT AND RIGHT HEART CATHETERIZATION WITH CORONARY ANGIOGRAM;  Surgeon: Thayer Headings, MD;  Location: [facility] CATH LAB;  Service: Cardiovascular;  Laterality: N/A;  . Anterior cervical decomp/discectomy fusion N/A 11/17/2014    Procedure: Cervical four- cervical five Anterior Cervical Decompression with fusion and bonegraft;  Surgeon: Ashok Pall, MD;  Location: [facility];  Service: Neurosurgery;  Laterality: N/A;  C45 anterior cervical decompression with fusion plating and bonegraft    There were no vitals filed for this visit.  Visit Diagnosis:  Shoulder weakness  Shoulder stiffness, left      Subjective Assessment - 06/07/15 1121    Subjective  S: I do these exercises at home with my broom stiick. I couldn't do this when I first came.    Currently in Pain? No/denies            [facility] OT Assessment - 06/07/15 1122    Assessment   Diagnosis Left rotator cuff tear   Precautions  Precautions None                  OT Treatments/Exercises (OP) - 06/07/15 1122    Exercises   Exercises Shoulder   Shoulder Exercises: Supine   Protraction PROM;5 reps;AAROM;15 reps   Horizontal ABduction PROM;5 reps;AAROM;15 reps   External Rotation PROM;5 reps;AROM;15 reps   Internal Rotation PROM;5 reps;AROM;15 reps   Flexion PROM;5 reps;AAROM;15 reps   ABduction PROM;5 reps;AAROM;15 reps   ABduction Limitations Only able to complete 25% range.   Shoulder Exercises: ROM/Strengthening   UBE (Upper Arm Bike) Level 1 2' forward 2' reverse   [place] 1'  therapist providing assistance to unweight arm.   Proximal Shoulder Strengthening, Supine 10X with therapist unweighting  arm and facilitating movement    Functional Reaching Activities   Mid Level Patient completed a functional reaching task of placing yellow, red. and green clothespins. Pt required assistance from therapist to off loading weight of LUE. Patient then removed clothespins with assistance to off load LUE as well.    Manual Therapy   Manual Therapy Myofascial release   Manual therapy comments manual therapy completed seperately from all other interventions this date   Myofascial Release myofascial release and manual stretching to left upper arm, scapular, and shoulder region to decrease pain and restrictions and improve pain free mobility in her left shoulder region                  OT Short Term Goals - 06/07/15 1123    OT SHORT TERM GOAL #1   Title Pt will be educated and independent with HEP to increase functional use of LUE during daily tasks.    Time 4   Period Weeks   OT SHORT TERM GOAL #2   Title Patient will increase P/ROM to WNL to increase ability to complete reaching activities below shoulder height.    Time 4   Period Weeks   OT SHORT TERM GOAL #3   Title Patient will increase LUE strength to 3+/5 to increase ability to pull pants up over hips.    Baseline Achieved in the sense that patient is able to pull her pants up over her hips easier. Her MMT measurements are not at a 3+/5   Time 4   Period Weeks   OT SHORT TERM GOAL #4   Title Patient will decrease fascial restrictions to min amount to increase functional mobility in LUE.    Time 4   Period Weeks   OT SHORT TERM GOAL #5   Title Patient will decrease pain level to 3/10 or less when using LUE during daily tasks.    Time 4   Period Weeks           OT Long Term Goals - 05/12/15 1127    OT LONG TERM GOAL #1   Title Patient will return to highest level of independence with LUE during daily tasks.    Time 8   Period Weeks   Status On-going   OT LONG TERM GOAL #2   Title Patient will increase A/ROM to [facility]  to increase ability to use LUE more during daily tasks.    Time 8   Period Weeks   Status On-going   OT LONG TERM GOAL #3   Title Patient will increase LUE strength to 4/5 to increase ability to complete lightweight household tasks.    Time 8   Period Weeks   Status On-going   OT LONG TERM GOAL #4  Title Patient will decrease fascial restrictions to trace amount in LUE to increase functional mobility during daily tasks.    Time 8   Period Weeks   Status On-going   OT LONG TERM GOAL #5   Title Patient will decrease pain level in LUE to 1/10 or less with use during daily tasks.    Time 8   Period Weeks   Status On-going               Plan - 06/07/15 1156    Clinical Impression Statement A: Pt continues to need assistance from therapist to unweight arms during all reaching tasks. VC for form when needed during exercises.    Plan P: Reassess for possible discharge.        Problem List Patient Active Problem List   Diagnosis Date Noted  . Osteoarthritis of spine with myelopathy, cervical region 11/17/2014  . Failed total left knee replacement ([place]) 08/10/2013  . Pulmonary nodules/lesions, multiple 08/09/2013  . HLD (hyperlipidemia) 01/22/2012  . Pulmonary hypertension ([place]) 01/08/2012  . Acute on chronic diastolic heart failure ([place]) 01/05/2012  . Acute respiratory failure with hypoxia ([place]) 01/03/2012  . Pulmonary edema, acute ([place]) 01/03/2012  . 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 HTN (hypertension) 01/02/2012  . COPD with acute exacerbation ([place]) 01/02/2012  . Tobacco abuse 07/04/2011  . 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 Leukocytosis 02/09/2011  . Normocytic anemia 02/09/2011  . Mild Thrombocytosis 02/09/2011  . COPD (chronic obstructive pulmonary disease) ([place]) 02/09/2011  . Obesity 02/09/2011    Ailene Ravel, OTR/L,CBIS  [phone]  06/07/2015, 11:57 AM  [place], [place], [address] Phone: [phone]   Fax:  [phone]  Name: Debbie Mullins MRN: [id number] Date of Birth: 12/23/1955

## 2015-06-09 ENCOUNTER — Ambulatory Visit (HOSPITAL_COMMUNITY): Payer: Medicare Other

## 2015-06-09 DIAGNOSIS — M25612 Stiffness of left shoulder, not elsewhere classified: Secondary | ICD-10-CM

## 2015-06-09 DIAGNOSIS — R29898 Other symptoms and signs involving the musculoskeletal system: Secondary | ICD-10-CM

## 2015-06-09 DIAGNOSIS — M25512 Pain in left shoulder: Secondary | ICD-10-CM | POA: Diagnosis not present

## 2015-06-09 DIAGNOSIS — M629 Disorder of muscle, unspecified: Secondary | ICD-10-CM | POA: Diagnosis not present

## 2015-06-09 NOTE — Patient Instructions (Signed)

## 2015-06-09 NOTE — Therapy (Signed)
[place], [place], [address] Phone: [phone]   Fax:  [phone]  Occupational Therapy Treatment And mini reassessment Patient Details  Name: Debbie Mullins MRN: [id number] Date of Birth: December 18, 1955 Referring Provider: Dr. Lynda Rainwater  Encounter Date: 06/09/2015      OT End of Session - 06/09/15 1200    Visit Number 15   Number of Visits 16   Date for OT Re-Evaluation 06/14/15   Authorization Type [employer] Medicare PPO - $40.00 copay   Authorization Time Period before 19th visit   Authorization - Visit Number 15   Authorization - Number of Visits 19   OT Start Time 1100  mini reassess   OT Stop Time 1150   OT Time Calculation (min) 50 min   Activity Tolerance Patient tolerated treatment well   Behavior During Therapy [facility] for tasks assessed/performed      Past Medical History  Diagnosis Date  . Anemia   . Leukocytosis 02/09/2011  . Normocytic anemia 02/09/2011  . Thrombocytosis ([place]) 02/09/2011  . COPD (chronic obstructive pulmonary disease) (HCC)     Albuterol inhaler prn;SIngulair at night  . Depression     takes Wellbutrin daily  . GERD (gastroesophageal reflux disease)     takes Nexium daily  . Hypertension     takes Lisinopril daily  . Diabetes mellitus     takes Metformin daily  . Asthma   . Pneumonia     many yrs ago  . History of colon polyps     benign  . Urinary frequency   . Urinary urgency   . Peripheral edema     takes Furosemide daily  . CHF (congestive heart failure) (HCC)     takes Furosemide daily   . Pulmonary nodules/lesions, multiple 08/09/2013  . Shortness of breath dyspnea     with exertion  . Weakness     numbness and tingling in both hands  . Joint pain   . Joint swelling   . Neck pain     HNP    Past Surgical History  Procedure Laterality Date  . Total knee arthroplasty Bilateral   . Tonsillectomy      as a child  . Colonoscopy    . Knee  arthroplasty Left 08/10/2013    Procedure:  TOTAL KNEE ARTHROPLASTY;  Surgeon: [name], MD;  Location: [facility];  Service: Orthopedics;  Laterality: Left;  Left Total Knee Arthroplasty Revision, Cemented, Semi-constrained,   . Left and right heart catheterization with coronary angiogram N/A 01/07/2012    Procedure: LEFT AND RIGHT HEART CATHETERIZATION WITH CORONARY ANGIOGRAM;  Surgeon: Thayer Headings, MD;  Location: [facility] CATH LAB;  Service: Cardiovascular;  Laterality: N/A;  . Anterior cervical decomp/discectomy fusion N/A 11/17/2014    Procedure: Cervical four- cervical five Anterior Cervical Decompression with fusion and bonegraft;  Surgeon: Ashok Pall, MD;  Location: [facility];  Service: Neurosurgery;  Laterality: N/A;  C45 anterior cervical decompression with fusion plating and bonegraft    There were no vitals filed for this visit.  Visit Diagnosis:  Shoulder weakness  Shoulder stiffness, left          [facility] OT Assessment - 06/09/15 1104    Assessment   Diagnosis Left rotator cuff tear   Precautions   Precautions None   AROM   Overall AROM Comments Assessed supine. IR/er adducted   AROM Assessment Site Shoulder   Right/Left Shoulder Left   Left Shoulder Flexion  72 Degrees  previous 65   Left Shoulder ABduction 80 Degrees  previous 79   Left Shoulder Internal Rotation 90 Degrees  previous: same   Left Shoulder External Rotation 90 Degrees  previous: 90   PROM   Overall PROM Comments Assessed supine. IR/er adducted   PROM Assessment Site Shoulder   Right/Left Shoulder Left   Left Shoulder Flexion 180 Degrees  previous 165   Left Shoulder ABduction 180 Degrees  previous 181   Left Shoulder Internal Rotation 90 Degrees  previous: 90   Left Shoulder External Rotation 90 Degrees  previous: 90   Strength   Overall Strength Comments Assessed supine. IR/er adducted   Strength Assessment Site Shoulder   Right/Left Shoulder Left   Left Shoulder Flexion 3-/5  same  previously   Left Shoulder ABduction 3-/5  same previously   Left Shoulder Internal Rotation 3+/5  previous 3+/5   Left Shoulder External Rotation 3+/5  previous 3/5                  OT Treatments/Exercises (OP) - 06/09/15 1104    Exercises   Exercises Shoulder   Shoulder Exercises: Supine   Protraction AROM;10 reps;PROM;5 reps  Min A from therapist   Horizontal ABduction PROM;5 reps;AAROM;15 reps   External Rotation PROM;5 reps;AROM;15 reps   Internal Rotation PROM;5 reps;AROM;15 reps   Flexion PROM;5 reps;AAROM;15 reps   ABduction PROM;5 reps;AAROM;15 reps   ABduction Limitations Only able to complete 25% range.   Other Supine Exercises serratus anterior punch 15 times with min facilitation to maintain full extension in elbow    Shoulder Exercises: Seated   Extension Theraband;10 reps   Theraband Level (Shoulder Extension) Level 2 (Red)   Retraction Theraband;10 reps   Theraband Level (Shoulder Retraction) Level 2 (Red)   Row Theraband;10 reps   Theraband Level (Shoulder Row) Level 2 (Red)   Shoulder Exercises: ROM/Strengthening   Proximal Shoulder Strengthening, Supine 10X with very minimal assistance from therapist   Manual Therapy   Manual Therapy Myofascial release   Manual therapy comments manual therapy completed seperately from all other interventions this date   Myofascial Release myofascial release and manual stretching to left upper arm, scapular, and shoulder region to decrease pain and restrictions and improve pain free mobility in her left shoulder region                OT Education - 06/09/15 1143    Education provided Yes   Education Details HEP Theraband exercises   Person(s) Educated Patient   Methods Explanation;Demonstration;Handout;Verbal cues;Tactile cues   Comprehension Verbalized understanding;Returned demonstration;Verbal cues required;Tactile cues required          OT Short Term Goals - 06/09/15 1133    OT SHORT TERM GOAL  #1   Title Pt will be educated and independent with HEP to increase functional use of LUE during daily tasks.    Time 4   Period Weeks   OT SHORT TERM GOAL #2   Title Patient will increase P/ROM to WNL to increase ability to complete reaching activities below shoulder height.    Time 4   Period Weeks   OT SHORT TERM GOAL #3   Title Patient will increase LUE strength to 3+/5 to increase ability to pull pants up over hips.    Baseline Achieved in the sense that patient is able to pull her pants up over her hips easier. Her MMT measurements are not at a 3+/5   Time 4  Period Weeks   OT SHORT TERM GOAL #4   Title Patient will decrease fascial restrictions to min amount to increase functional mobility in LUE.    Time 4   Period Weeks   OT SHORT TERM GOAL #5   Title Patient will decrease pain level to 3/10 or less when using LUE during daily tasks.    Time 4   Period Weeks           OT Long Term Goals - 07/01/2015 1134    OT LONG TERM GOAL #1   Title Patient will return to highest level of independence with LUE during daily tasks.    Time 8   Period Weeks   Status On-going   OT LONG TERM GOAL #2   Title Patient will increase A/ROM to [facility] to increase ability to use LUE more during daily tasks.    Time 8   Period Weeks   Status On-going   OT LONG TERM GOAL #3   Title Patient will increase LUE strength to 4/5 to increase ability to complete lightweight household tasks.    Time 8   Period Weeks   Status On-going   OT LONG TERM GOAL #4   Title Patient will decrease fascial restrictions to trace amount in LUE to increase functional mobility during daily tasks.    Time 8   Period Weeks   Status On-going   OT LONG TERM GOAL #5   Title Patient will decrease pain level in LUE to 1/10 or less with use during daily tasks.    Time 8   Period Weeks   Status Achieved               Plan - Jul 01, 2015 1200    Clinical Impression Statement A: mini reassessment completed this date.  patient has met 1 long term goal. No increase in strength or AROM measurements. Patient did show improvement with P/ROM measurements. Discussed patient's progress in therapy so far and the limited progress due to rotator cuff tear. Recommend that patient discharge from therapy and follow up with MD to discuss next step in treatment for left shoulder. Pt agreed with recommendation. HEP updated and reviewed.    Plan P: D/C from therapy with HEP. Follow up with MD for next step of treatment.           G-Codes - 2015-07-01 1203    Functional Assessment Tool Used FOTO score: 45/100 (55% impaired)   Functional Limitation Carrying, moving and handling objects   Carrying, Moving and Handling Objects Goal Status ([id number]) At least 20 percent but less than 40 percent impaired, limited or restricted   Carrying, Moving and Handling Objects Discharge Status [phone]) At least 40 percent but less than 60 percent impaired, limited or restricted      Problem List Patient Active Problem List   Diagnosis Date Noted  . Osteoarthritis of spine with myelopathy, cervical region 11/17/2014  . Failed total left knee replacement ([place]) 08/10/2013  . Pulmonary nodules/lesions, multiple 08/09/2013  . HLD (hyperlipidemia) 01/22/2012  . Pulmonary hypertension ([place]) 01/08/2012  . Acute on chronic diastolic heart failure ([place]) 01/05/2012  . Acute respiratory failure with hypoxia ([place]) 01/03/2012  . Pulmonary edema, acute ([place]) 01/03/2012  . 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 HTN (hypertension) 01/02/2012  . COPD with acute exacerbation ([place]) 01/02/2012  . Tobacco abuse 07/04/2011  . 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 Leukocytosis 02/09/2011  . Normocytic anemia 02/09/2011  . Mild  Thrombocytosis 02/09/2011  . COPD (chronic obstructive pulmonary disease) ([place]) 02/09/2011  . Obesity 02/09/2011   .OCCUPATIONAL THERAPY DISCHARGE SUMMARY  Visits from Start of Care: 15  Current functional level related  to goals / functional outcomes: See above   Remaining deficits: See above   Education / Equipment: See above Plan: Patient agrees to discharge.  Patient goals were partially met. Patient is being discharged due to lack of progress.  ?????       Ailene Ravel, OTR/L,CBIS  [phone]  06/09/2015, 12:12 PM  [facility] [address] [place], [place], [address] Phone: [phone]   Fax:  [phone]  Name: Debbie Mullins MRN: [id number] Date of Birth: 1955-09-07

## 2015-07-04 DIAGNOSIS — E119 Type 2 diabetes mellitus without complications: Secondary | ICD-10-CM | POA: Diagnosis not present

## 2015-07-04 DIAGNOSIS — I10 Essential (primary) hypertension: Secondary | ICD-10-CM | POA: Diagnosis not present

## 2015-07-04 DIAGNOSIS — J449 Chronic obstructive pulmonary disease, unspecified: Secondary | ICD-10-CM | POA: Diagnosis not present

## 2015-07-05 DIAGNOSIS — M5002 Cervical disc disorder with myelopathy, mid-cervical region, unspecified level: Secondary | ICD-10-CM | POA: Diagnosis not present

## 2015-09-15 DIAGNOSIS — M5000 Cervical disc disorder with myelopathy, unspecified cervical region: Secondary | ICD-10-CM | POA: Diagnosis not present

## 2015-09-15 DIAGNOSIS — I10 Essential (primary) hypertension: Secondary | ICD-10-CM | POA: Diagnosis not present

## 2015-11-19 DIAGNOSIS — J449 Chronic obstructive pulmonary disease, unspecified: Secondary | ICD-10-CM | POA: Diagnosis not present

## 2015-11-19 DIAGNOSIS — E118 Type 2 diabetes mellitus with unspecified complications: Secondary | ICD-10-CM | POA: Diagnosis not present

## 2015-11-19 DIAGNOSIS — E785 Hyperlipidemia, unspecified: Secondary | ICD-10-CM | POA: Diagnosis not present

## 2015-11-19 DIAGNOSIS — I10 Essential (primary) hypertension: Secondary | ICD-10-CM | POA: Diagnosis not present

## 2015-11-21 DIAGNOSIS — I10 Essential (primary) hypertension: Secondary | ICD-10-CM | POA: Diagnosis not present

## 2015-11-21 DIAGNOSIS — E119 Type 2 diabetes mellitus without complications: Secondary | ICD-10-CM | POA: Diagnosis not present

## 2015-11-21 DIAGNOSIS — J441 Chronic obstructive pulmonary disease with (acute) exacerbation: Secondary | ICD-10-CM | POA: Diagnosis not present

## 2016-03-19 DIAGNOSIS — E785 Hyperlipidemia, unspecified: Secondary | ICD-10-CM | POA: Diagnosis not present

## 2016-03-19 DIAGNOSIS — E119 Type 2 diabetes mellitus without complications: Secondary | ICD-10-CM | POA: Diagnosis not present

## 2016-03-19 DIAGNOSIS — I10 Essential (primary) hypertension: Secondary | ICD-10-CM | POA: Diagnosis not present

## 2016-03-22 DIAGNOSIS — Z9181 History of falling: Secondary | ICD-10-CM | POA: Diagnosis not present

## 2016-03-22 DIAGNOSIS — E119 Type 2 diabetes mellitus without complications: Secondary | ICD-10-CM | POA: Diagnosis not present

## 2016-03-22 DIAGNOSIS — M545 Low back pain: Secondary | ICD-10-CM | POA: Diagnosis not present

## 2016-03-22 DIAGNOSIS — M79661 Pain in right lower leg: Secondary | ICD-10-CM | POA: Diagnosis not present

## 2016-03-22 DIAGNOSIS — I10 Essential (primary) hypertension: Secondary | ICD-10-CM | POA: Diagnosis not present

## 2016-03-22 DIAGNOSIS — M79662 Pain in left lower leg: Secondary | ICD-10-CM | POA: Diagnosis not present

## 2016-03-22 DIAGNOSIS — R2689 Other abnormalities of gait and mobility: Secondary | ICD-10-CM | POA: Diagnosis not present

## 2016-03-27 DIAGNOSIS — M79662 Pain in left lower leg: Secondary | ICD-10-CM | POA: Diagnosis not present

## 2016-03-27 DIAGNOSIS — E119 Type 2 diabetes mellitus without complications: Secondary | ICD-10-CM | POA: Diagnosis not present

## 2016-03-27 DIAGNOSIS — I10 Essential (primary) hypertension: Secondary | ICD-10-CM | POA: Diagnosis not present

## 2016-03-27 DIAGNOSIS — R2689 Other abnormalities of gait and mobility: Secondary | ICD-10-CM | POA: Diagnosis not present

## 2016-03-27 DIAGNOSIS — Z9181 History of falling: Secondary | ICD-10-CM | POA: Diagnosis not present

## 2016-03-27 DIAGNOSIS — M545 Low back pain: Secondary | ICD-10-CM | POA: Diagnosis not present

## 2016-03-27 DIAGNOSIS — M79661 Pain in right lower leg: Secondary | ICD-10-CM | POA: Diagnosis not present

## 2016-03-31 DIAGNOSIS — Z9181 History of falling: Secondary | ICD-10-CM | POA: Diagnosis not present

## 2016-03-31 DIAGNOSIS — R2689 Other abnormalities of gait and mobility: Secondary | ICD-10-CM | POA: Diagnosis not present

## 2016-03-31 DIAGNOSIS — I10 Essential (primary) hypertension: Secondary | ICD-10-CM | POA: Diagnosis not present

## 2016-03-31 DIAGNOSIS — M79662 Pain in left lower leg: Secondary | ICD-10-CM | POA: Diagnosis not present

## 2016-03-31 DIAGNOSIS — M79661 Pain in right lower leg: Secondary | ICD-10-CM | POA: Diagnosis not present

## 2016-03-31 DIAGNOSIS — M545 Low back pain: Secondary | ICD-10-CM | POA: Diagnosis not present

## 2016-03-31 DIAGNOSIS — E119 Type 2 diabetes mellitus without complications: Secondary | ICD-10-CM | POA: Diagnosis not present

## 2016-04-03 DIAGNOSIS — R2689 Other abnormalities of gait and mobility: Secondary | ICD-10-CM | POA: Diagnosis not present

## 2016-04-03 DIAGNOSIS — M79662 Pain in left lower leg: Secondary | ICD-10-CM | POA: Diagnosis not present

## 2016-04-03 DIAGNOSIS — I10 Essential (primary) hypertension: Secondary | ICD-10-CM | POA: Diagnosis not present

## 2016-04-03 DIAGNOSIS — Z9181 History of falling: Secondary | ICD-10-CM | POA: Diagnosis not present

## 2016-04-03 DIAGNOSIS — E119 Type 2 diabetes mellitus without complications: Secondary | ICD-10-CM | POA: Diagnosis not present

## 2016-04-03 DIAGNOSIS — M545 Low back pain: Secondary | ICD-10-CM | POA: Diagnosis not present

## 2016-04-03 DIAGNOSIS — M79661 Pain in right lower leg: Secondary | ICD-10-CM | POA: Diagnosis not present

## 2016-04-05 DIAGNOSIS — M545 Low back pain: Secondary | ICD-10-CM | POA: Diagnosis not present

## 2016-04-05 DIAGNOSIS — Z9181 History of falling: Secondary | ICD-10-CM | POA: Diagnosis not present

## 2016-04-05 DIAGNOSIS — E119 Type 2 diabetes mellitus without complications: Secondary | ICD-10-CM | POA: Diagnosis not present

## 2016-04-05 DIAGNOSIS — M79662 Pain in left lower leg: Secondary | ICD-10-CM | POA: Diagnosis not present

## 2016-04-05 DIAGNOSIS — M79661 Pain in right lower leg: Secondary | ICD-10-CM | POA: Diagnosis not present

## 2016-04-05 DIAGNOSIS — R2689 Other abnormalities of gait and mobility: Secondary | ICD-10-CM | POA: Diagnosis not present

## 2016-04-05 DIAGNOSIS — I10 Essential (primary) hypertension: Secondary | ICD-10-CM | POA: Diagnosis not present

## 2016-04-10 DIAGNOSIS — R2689 Other abnormalities of gait and mobility: Secondary | ICD-10-CM | POA: Diagnosis not present

## 2016-04-10 DIAGNOSIS — M79661 Pain in right lower leg: Secondary | ICD-10-CM | POA: Diagnosis not present

## 2016-04-10 DIAGNOSIS — I10 Essential (primary) hypertension: Secondary | ICD-10-CM | POA: Diagnosis not present

## 2016-04-10 DIAGNOSIS — M79662 Pain in left lower leg: Secondary | ICD-10-CM | POA: Diagnosis not present

## 2016-04-10 DIAGNOSIS — M545 Low back pain: Secondary | ICD-10-CM | POA: Diagnosis not present

## 2016-04-10 DIAGNOSIS — Z9181 History of falling: Secondary | ICD-10-CM | POA: Diagnosis not present

## 2016-04-10 DIAGNOSIS — E119 Type 2 diabetes mellitus without complications: Secondary | ICD-10-CM | POA: Diagnosis not present

## 2016-04-12 DIAGNOSIS — Z9181 History of falling: Secondary | ICD-10-CM | POA: Diagnosis not present

## 2016-04-12 DIAGNOSIS — I10 Essential (primary) hypertension: Secondary | ICD-10-CM | POA: Diagnosis not present

## 2016-04-12 DIAGNOSIS — M79662 Pain in left lower leg: Secondary | ICD-10-CM | POA: Diagnosis not present

## 2016-04-12 DIAGNOSIS — M545 Low back pain: Secondary | ICD-10-CM | POA: Diagnosis not present

## 2016-04-12 DIAGNOSIS — E119 Type 2 diabetes mellitus without complications: Secondary | ICD-10-CM | POA: Diagnosis not present

## 2016-04-12 DIAGNOSIS — M79661 Pain in right lower leg: Secondary | ICD-10-CM | POA: Diagnosis not present

## 2016-04-12 DIAGNOSIS — R2689 Other abnormalities of gait and mobility: Secondary | ICD-10-CM | POA: Diagnosis not present

## 2016-04-17 DIAGNOSIS — I10 Essential (primary) hypertension: Secondary | ICD-10-CM | POA: Diagnosis not present

## 2016-04-17 DIAGNOSIS — E119 Type 2 diabetes mellitus without complications: Secondary | ICD-10-CM | POA: Diagnosis not present

## 2016-04-17 DIAGNOSIS — M545 Low back pain: Secondary | ICD-10-CM | POA: Diagnosis not present

## 2016-04-17 DIAGNOSIS — R2689 Other abnormalities of gait and mobility: Secondary | ICD-10-CM | POA: Diagnosis not present

## 2016-04-17 DIAGNOSIS — M79661 Pain in right lower leg: Secondary | ICD-10-CM | POA: Diagnosis not present

## 2016-04-17 DIAGNOSIS — Z9181 History of falling: Secondary | ICD-10-CM | POA: Diagnosis not present

## 2016-04-17 DIAGNOSIS — M79662 Pain in left lower leg: Secondary | ICD-10-CM | POA: Diagnosis not present

## 2016-04-19 DIAGNOSIS — M545 Low back pain: Secondary | ICD-10-CM | POA: Diagnosis not present

## 2016-04-19 DIAGNOSIS — Z9181 History of falling: Secondary | ICD-10-CM | POA: Diagnosis not present

## 2016-04-19 DIAGNOSIS — I10 Essential (primary) hypertension: Secondary | ICD-10-CM | POA: Diagnosis not present

## 2016-04-19 DIAGNOSIS — E119 Type 2 diabetes mellitus without complications: Secondary | ICD-10-CM | POA: Diagnosis not present

## 2016-04-19 DIAGNOSIS — M79661 Pain in right lower leg: Secondary | ICD-10-CM | POA: Diagnosis not present

## 2016-04-19 DIAGNOSIS — M79662 Pain in left lower leg: Secondary | ICD-10-CM | POA: Diagnosis not present

## 2016-04-19 DIAGNOSIS — R2689 Other abnormalities of gait and mobility: Secondary | ICD-10-CM | POA: Diagnosis not present

## 2016-04-25 DIAGNOSIS — E119 Type 2 diabetes mellitus without complications: Secondary | ICD-10-CM | POA: Diagnosis not present

## 2016-04-25 DIAGNOSIS — I10 Essential (primary) hypertension: Secondary | ICD-10-CM | POA: Diagnosis not present

## 2016-04-25 DIAGNOSIS — R2689 Other abnormalities of gait and mobility: Secondary | ICD-10-CM | POA: Diagnosis not present

## 2016-04-25 DIAGNOSIS — Z9181 History of falling: Secondary | ICD-10-CM | POA: Diagnosis not present

## 2016-04-25 DIAGNOSIS — M79662 Pain in left lower leg: Secondary | ICD-10-CM | POA: Diagnosis not present

## 2016-04-25 DIAGNOSIS — M79661 Pain in right lower leg: Secondary | ICD-10-CM | POA: Diagnosis not present

## 2016-04-25 DIAGNOSIS — M545 Low back pain: Secondary | ICD-10-CM | POA: Diagnosis not present

## 2016-04-26 DIAGNOSIS — E119 Type 2 diabetes mellitus without complications: Secondary | ICD-10-CM | POA: Diagnosis not present

## 2016-04-26 DIAGNOSIS — Z9181 History of falling: Secondary | ICD-10-CM | POA: Diagnosis not present

## 2016-04-26 DIAGNOSIS — I10 Essential (primary) hypertension: Secondary | ICD-10-CM | POA: Diagnosis not present

## 2016-04-26 DIAGNOSIS — M545 Low back pain: Secondary | ICD-10-CM | POA: Diagnosis not present

## 2016-04-26 DIAGNOSIS — R2689 Other abnormalities of gait and mobility: Secondary | ICD-10-CM | POA: Diagnosis not present

## 2016-04-26 DIAGNOSIS — M79662 Pain in left lower leg: Secondary | ICD-10-CM | POA: Diagnosis not present

## 2016-04-26 DIAGNOSIS — M79661 Pain in right lower leg: Secondary | ICD-10-CM | POA: Diagnosis not present

## 2016-05-01 DIAGNOSIS — M545 Low back pain: Secondary | ICD-10-CM | POA: Diagnosis not present

## 2016-05-01 DIAGNOSIS — M79661 Pain in right lower leg: Secondary | ICD-10-CM | POA: Diagnosis not present

## 2016-05-01 DIAGNOSIS — M79662 Pain in left lower leg: Secondary | ICD-10-CM | POA: Diagnosis not present

## 2016-05-01 DIAGNOSIS — Z9181 History of falling: Secondary | ICD-10-CM | POA: Diagnosis not present

## 2016-05-01 DIAGNOSIS — E119 Type 2 diabetes mellitus without complications: Secondary | ICD-10-CM | POA: Diagnosis not present

## 2016-05-01 DIAGNOSIS — I10 Essential (primary) hypertension: Secondary | ICD-10-CM | POA: Diagnosis not present

## 2016-05-01 DIAGNOSIS — R2689 Other abnormalities of gait and mobility: Secondary | ICD-10-CM | POA: Diagnosis not present

## 2016-05-03 DIAGNOSIS — R2689 Other abnormalities of gait and mobility: Secondary | ICD-10-CM | POA: Diagnosis not present

## 2016-05-03 DIAGNOSIS — M79662 Pain in left lower leg: Secondary | ICD-10-CM | POA: Diagnosis not present

## 2016-05-03 DIAGNOSIS — E119 Type 2 diabetes mellitus without complications: Secondary | ICD-10-CM | POA: Diagnosis not present

## 2016-05-03 DIAGNOSIS — I10 Essential (primary) hypertension: Secondary | ICD-10-CM | POA: Diagnosis not present

## 2016-05-03 DIAGNOSIS — M545 Low back pain: Secondary | ICD-10-CM | POA: Diagnosis not present

## 2016-05-03 DIAGNOSIS — M79661 Pain in right lower leg: Secondary | ICD-10-CM | POA: Diagnosis not present

## 2016-05-03 DIAGNOSIS — Z9181 History of falling: Secondary | ICD-10-CM | POA: Diagnosis not present

## 2016-05-08 DIAGNOSIS — I10 Essential (primary) hypertension: Secondary | ICD-10-CM | POA: Diagnosis not present

## 2016-05-08 DIAGNOSIS — Z9181 History of falling: Secondary | ICD-10-CM | POA: Diagnosis not present

## 2016-05-08 DIAGNOSIS — R2689 Other abnormalities of gait and mobility: Secondary | ICD-10-CM | POA: Diagnosis not present

## 2016-05-08 DIAGNOSIS — M79662 Pain in left lower leg: Secondary | ICD-10-CM | POA: Diagnosis not present

## 2016-05-08 DIAGNOSIS — M79661 Pain in right lower leg: Secondary | ICD-10-CM | POA: Diagnosis not present

## 2016-05-08 DIAGNOSIS — M545 Low back pain: Secondary | ICD-10-CM | POA: Diagnosis not present

## 2016-05-08 DIAGNOSIS — E119 Type 2 diabetes mellitus without complications: Secondary | ICD-10-CM | POA: Diagnosis not present

## 2016-05-10 DIAGNOSIS — E119 Type 2 diabetes mellitus without complications: Secondary | ICD-10-CM | POA: Diagnosis not present

## 2016-05-10 DIAGNOSIS — I10 Essential (primary) hypertension: Secondary | ICD-10-CM | POA: Diagnosis not present

## 2016-05-10 DIAGNOSIS — R2689 Other abnormalities of gait and mobility: Secondary | ICD-10-CM | POA: Diagnosis not present

## 2016-05-10 DIAGNOSIS — Z9181 History of falling: Secondary | ICD-10-CM | POA: Diagnosis not present

## 2016-05-10 DIAGNOSIS — M79661 Pain in right lower leg: Secondary | ICD-10-CM | POA: Diagnosis not present

## 2016-05-10 DIAGNOSIS — M545 Low back pain: Secondary | ICD-10-CM | POA: Diagnosis not present

## 2016-05-10 DIAGNOSIS — M79662 Pain in left lower leg: Secondary | ICD-10-CM | POA: Diagnosis not present

## 2016-05-15 DIAGNOSIS — I10 Essential (primary) hypertension: Secondary | ICD-10-CM | POA: Diagnosis not present

## 2016-05-15 DIAGNOSIS — M79661 Pain in right lower leg: Secondary | ICD-10-CM | POA: Diagnosis not present

## 2016-05-15 DIAGNOSIS — R2689 Other abnormalities of gait and mobility: Secondary | ICD-10-CM | POA: Diagnosis not present

## 2016-05-15 DIAGNOSIS — M545 Low back pain: Secondary | ICD-10-CM | POA: Diagnosis not present

## 2016-05-15 DIAGNOSIS — E119 Type 2 diabetes mellitus without complications: Secondary | ICD-10-CM | POA: Diagnosis not present

## 2016-05-15 DIAGNOSIS — M79662 Pain in left lower leg: Secondary | ICD-10-CM | POA: Diagnosis not present

## 2016-05-15 DIAGNOSIS — Z9181 History of falling: Secondary | ICD-10-CM | POA: Diagnosis not present

## 2016-05-17 DIAGNOSIS — Z9181 History of falling: Secondary | ICD-10-CM | POA: Diagnosis not present

## 2016-05-17 DIAGNOSIS — I10 Essential (primary) hypertension: Secondary | ICD-10-CM | POA: Diagnosis not present

## 2016-05-17 DIAGNOSIS — M79662 Pain in left lower leg: Secondary | ICD-10-CM | POA: Diagnosis not present

## 2016-05-17 DIAGNOSIS — R2689 Other abnormalities of gait and mobility: Secondary | ICD-10-CM | POA: Diagnosis not present

## 2016-05-17 DIAGNOSIS — M79661 Pain in right lower leg: Secondary | ICD-10-CM | POA: Diagnosis not present

## 2016-05-17 DIAGNOSIS — M545 Low back pain: Secondary | ICD-10-CM | POA: Diagnosis not present

## 2016-05-17 DIAGNOSIS — E119 Type 2 diabetes mellitus without complications: Secondary | ICD-10-CM | POA: Diagnosis not present

## 2016-05-23 ENCOUNTER — Inpatient Hospital Stay (HOSPITAL_COMMUNITY)
Admission: EM | Admit: 2016-05-23 | Discharge: 2016-05-28 | DRG: 481 | Disposition: A | Discharge status: Skilled Nursing Facility | Payer: Medicare Other | Attending: Orthopaedic Surgery | Admitting: Orthopaedic Surgery

## 2016-05-23 ENCOUNTER — Emergency Department (HOSPITAL_COMMUNITY): Payer: Medicare Other

## 2016-05-23 ENCOUNTER — Emergency Department (HOSPITAL_COMMUNITY): Payer: Medicare Other | Admitting: Anesthesiology

## 2016-05-23 ENCOUNTER — Encounter (HOSPITAL_COMMUNITY): Payer: Self-pay | Admitting: Emergency Medicine

## 2016-05-23 ENCOUNTER — Encounter (HOSPITAL_COMMUNITY)
Admission: EM | Disposition: A | Discharge status: Skilled Nursing Facility | Payer: Self-pay | Source: Home / Self Care | Attending: Orthopaedic Surgery

## 2016-05-23 DIAGNOSIS — M9711XA 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, initial encounter: Secondary | ICD-10-CM

## 2016-05-23 DIAGNOSIS — M25551 Pain in right hip: Secondary | ICD-10-CM | POA: Diagnosis not present

## 2016-05-23 DIAGNOSIS — I10 Essential (primary) hypertension: Secondary | ICD-10-CM | POA: Diagnosis present

## 2016-05-23 DIAGNOSIS — Z7984 Long term (current) use of oral hypoglycemic drugs: Secondary | ICD-10-CM

## 2016-05-23 DIAGNOSIS — F17210 Nicotine dependence, cigarettes, uncomplicated: Secondary | ICD-10-CM | POA: Diagnosis present

## 2016-05-23 DIAGNOSIS — M25561 Pain in right knee: Secondary | ICD-10-CM | POA: Diagnosis not present

## 2016-05-23 DIAGNOSIS — I129 Hypertensive chronic kidney disease with stage 1 through stage 4 chronic kidney disease, or unspecified chronic kidney disease: Secondary | ICD-10-CM | POA: Diagnosis not present

## 2016-05-23 DIAGNOSIS — D6489 Other specified anemias: Secondary | ICD-10-CM | POA: Diagnosis not present

## 2016-05-23 DIAGNOSIS — E860 Dehydration: Secondary | ICD-10-CM | POA: Diagnosis not present

## 2016-05-23 DIAGNOSIS — E875 Hyperkalemia: Secondary | ICD-10-CM | POA: Diagnosis not present

## 2016-05-23 DIAGNOSIS — S72451A Displaced supracondylar fracture without intracondylar extension of lower end of right femur, initial encounter for closed fracture: Secondary | ICD-10-CM | POA: Diagnosis not present

## 2016-05-23 DIAGNOSIS — S72492A Other fracture of lower end of left femur, initial encounter for closed fracture: Secondary | ICD-10-CM | POA: Diagnosis not present

## 2016-05-23 DIAGNOSIS — S72401A Unspecified fracture of lower end of right femur, initial encounter for closed fracture: Secondary | ICD-10-CM | POA: Diagnosis not present

## 2016-05-23 DIAGNOSIS — N181 Chronic kidney disease, stage 1: Secondary | ICD-10-CM | POA: Diagnosis not present

## 2016-05-23 DIAGNOSIS — Y92009 Unspecified place in unspecified non-institutional (private) residence as the place of occurrence of the external cause: Secondary | ICD-10-CM

## 2016-05-23 DIAGNOSIS — R52 Pain, unspecified: Secondary | ICD-10-CM | POA: Diagnosis not present

## 2016-05-23 DIAGNOSIS — I5033 Acute on chronic diastolic (congestive) heart failure: Secondary | ICD-10-CM | POA: Diagnosis not present

## 2016-05-23 DIAGNOSIS — K219 Gastro-esophageal reflux disease without esophagitis: Secondary | ICD-10-CM | POA: Diagnosis not present

## 2016-05-23 DIAGNOSIS — M879 Osteonecrosis, unspecified: Secondary | ICD-10-CM | POA: Diagnosis not present

## 2016-05-23 DIAGNOSIS — S728X9A Other fracture of unspecified femur, initial encounter for closed fracture: Secondary | ICD-10-CM | POA: Diagnosis not present

## 2016-05-23 DIAGNOSIS — M9711XD 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, subsequent encounter: Secondary | ICD-10-CM | POA: Diagnosis not present

## 2016-05-23 DIAGNOSIS — Z8249 Family history of ischemic heart disease and other diseases of the circulatory system: Secondary | ICD-10-CM | POA: Diagnosis not present

## 2016-05-23 DIAGNOSIS — R918 Other nonspecific abnormal finding of lung field: Secondary | ICD-10-CM | POA: Diagnosis not present

## 2016-05-23 DIAGNOSIS — E1122 Type 2 diabetes mellitus with diabetic chronic kidney disease: Secondary | ICD-10-CM | POA: Diagnosis present

## 2016-05-23 DIAGNOSIS — D649 Anemia, unspecified: Secondary | ICD-10-CM | POA: Diagnosis not present

## 2016-05-23 DIAGNOSIS — I5032 Chronic diastolic (congestive) heart failure: Secondary | ICD-10-CM | POA: Diagnosis present

## 2016-05-23 DIAGNOSIS — Z96653 Presence of artificial knee joint, bilateral: Secondary | ICD-10-CM | POA: Diagnosis not present

## 2016-05-23 DIAGNOSIS — S79911A Unspecified injury of right hip, initial encounter: Secondary | ICD-10-CM | POA: Diagnosis not present

## 2016-05-23 DIAGNOSIS — J449 Chronic obstructive pulmonary disease, unspecified: Secondary | ICD-10-CM | POA: Diagnosis not present

## 2016-05-23 DIAGNOSIS — Z981 Arthrodesis status: Secondary | ICD-10-CM

## 2016-05-23 DIAGNOSIS — I130 Hypertensive heart and chronic kidney disease with heart failure and stage 1 through stage 4 chronic kidney disease, or unspecified chronic kidney disease: Secondary | ICD-10-CM | POA: Diagnosis not present

## 2016-05-23 DIAGNOSIS — R2689 Other abnormalities of gait and mobility: Secondary | ICD-10-CM | POA: Diagnosis not present

## 2016-05-23 DIAGNOSIS — R278 Other lack of coordination: Secondary | ICD-10-CM | POA: Diagnosis not present

## 2016-05-23 DIAGNOSIS — W1830XA Fall on same level, unspecified, initial encounter: Secondary | ICD-10-CM | POA: Diagnosis present

## 2016-05-23 DIAGNOSIS — E785 Hyperlipidemia, unspecified: Secondary | ICD-10-CM | POA: Diagnosis not present

## 2016-05-23 DIAGNOSIS — F329 Major depressive disorder, single episode, unspecified: Secondary | ICD-10-CM | POA: Diagnosis present

## 2016-05-23 DIAGNOSIS — S79919A Unspecified injury of unspecified hip, initial encounter: Secondary | ICD-10-CM | POA: Diagnosis not present

## 2016-05-23 HISTORY — PX: FEMUR IM NAIL: SHX1597

## 2016-05-23 LAB — CBC WITH DIFFERENTIAL/PLATELET
Basophils Absolute: 0 10*3/uL (ref 0.0–0.1)
Basophils Relative: 0 %
Eosinophils Absolute: 0.2 10*3/uL (ref 0.0–0.7)
Eosinophils Relative: 2 %
HCT: 34.2 % — ABNORMAL LOW (ref 36.0–46.0)
Hemoglobin: 11.3 g/dL — ABNORMAL LOW (ref 12.0–15.0)
Lymphocytes Relative: 30 %
Lymphs Abs: 2.7 10*3/uL (ref 0.7–4.0)
MCH: 29.5 pg (ref 26.0–34.0)
MCHC: 33 g/dL (ref 30.0–36.0)
MCV: 89.3 fL (ref 78.0–100.0)
Monocytes Absolute: 0.7 10*3/uL (ref 0.1–1.0)
Monocytes Relative: 8 %
Neutro Abs: 5.5 10*3/uL (ref 1.7–7.7)
Neutrophils Relative %: 60 %
Platelets: 351 10*3/uL (ref 150–400)
RBC: 3.83 MIL/uL — ABNORMAL LOW (ref 3.87–5.11)
RDW: 17.2 % — ABNORMAL HIGH (ref 11.5–15.5)
WBC: 9.2 10*3/uL (ref 4.0–10.5)

## 2016-05-23 LAB — BASIC METABOLIC PANEL
Anion gap: 9 (ref 5–15)
BUN: 25 mg/dL — ABNORMAL HIGH (ref 6–20)
CO2: 25 mmol/L (ref 22–32)
Calcium: 9 mg/dL (ref 8.9–10.3)
Chloride: 101 mmol/L (ref 101–111)
Creatinine, Ser: 1.1 mg/dL — ABNORMAL HIGH (ref 0.44–1.00)
GFR calc Af Amer: >60 mL/min (ref >60)
GFR calc non Af Amer: 54 mL/min — ABNORMAL LOW (ref >60)
Glucose, Bld: 106 mg/dL — ABNORMAL HIGH (ref 65–99)
Potassium: 4.7 mmol/L (ref 3.5–5.1)
Sodium: 135 mmol/L (ref 135–145)

## 2016-05-23 LAB — SURGICAL PCR SCREEN
MRSA, PCR: NEGATIVE
Staphylococcus aureus: NEGATIVE

## 2016-05-23 LAB — GLUCOSE, CAPILLARY: Glucose-Capillary: 101 mg/dL — ABNORMAL HIGH (ref 65–99)

## 2016-05-23 LAB — APTT: aPTT: 36 seconds (ref 24–36)

## 2016-05-23 LAB — CK: Total CK: 114 U/L (ref 38–234)

## 2016-05-23 LAB — PROTIME-INR
INR: 1.02
Prothrombin Time: 13.4 seconds (ref 11.4–15.2)

## 2016-05-23 LAB — CBG MONITORING, ED: Glucose-Capillary: 88 mg/dL (ref 65–99)

## 2016-05-23 SURGERY — INSERTION, INTRAMEDULLARY ROD, FEMUR, RETROGRADE
Anesthesia: General | Site: Leg Upper | Laterality: Right

## 2016-05-23 MED ORDER — PHENYLEPHRINE HCL 10 MG/ML IJ SOLN
INTRAVENOUS | Status: DC | PRN
  Administered 2016-05-23: 50 ug/min via INTRAVENOUS

## 2016-05-23 MED ORDER — LACTATED RINGERS IV SOLN
INTRAVENOUS | Status: DC
  Administered 2016-05-23: 17:00:00 via INTRAVENOUS

## 2016-05-23 MED ORDER — CEFAZOLIN SODIUM-DEXTROSE 2-4 GM/100ML-% IV SOLN
INTRAVENOUS | Status: AC
  Filled 2016-05-23: qty 100

## 2016-05-23 MED ORDER — FENTANYL CITRATE (PF) 100 MCG/2ML IJ SOLN
INTRAMUSCULAR | Status: AC
  Filled 2016-05-23: qty 2

## 2016-05-23 MED ORDER — ASPIRIN EC 325 MG PO TBEC
325.0000 mg | DELAYED_RELEASE_TABLET | Freq: Every day | ORAL | Status: DC
  Administered 2016-05-24 – 2016-05-28 (×5): 325 mg via ORAL
  Filled 2016-05-23 (×5): qty 1

## 2016-05-23 MED ORDER — PHENYLEPHRINE HCL 10 MG/ML IJ SOLN
INTRAMUSCULAR | Status: DC | PRN
  Administered 2016-05-23 (×4): 80 ug via INTRAVENOUS

## 2016-05-23 MED ORDER — HYDROMORPHONE HCL 1 MG/ML IJ SOLN
INTRAMUSCULAR | Status: AC
  Filled 2016-05-23: qty 0.5

## 2016-05-23 MED ORDER — LISINOPRIL 10 MG PO TABS
10.0000 mg | ORAL_TABLET | Freq: Every day | ORAL | Status: DC
  Administered 2016-05-24: 10 mg via ORAL
  Filled 2016-05-23 (×2): qty 1

## 2016-05-23 MED ORDER — PANTOPRAZOLE SODIUM 40 MG PO TBEC
80.0000 mg | DELAYED_RELEASE_TABLET | Freq: Every day | ORAL | Status: DC
  Administered 2016-05-24 – 2016-05-28 (×5): 80 mg via ORAL
  Filled 2016-05-23 (×5): qty 2

## 2016-05-23 MED ORDER — FUROSEMIDE 20 MG PO TABS
20.0000 mg | ORAL_TABLET | ORAL | Status: DC
  Administered 2016-05-24: 20 mg via ORAL
  Filled 2016-05-23 (×2): qty 1

## 2016-05-23 MED ORDER — CHLORHEXIDINE GLUCONATE 4 % EX LIQD: 60.0000 mL | Freq: Once | CUTANEOUS | Status: DC

## 2016-05-23 MED ORDER — DOCUSATE SODIUM 100 MG PO CAPS
100.0000 mg | ORAL_CAPSULE | Freq: Two times a day (BID) | ORAL | Status: DC
  Administered 2016-05-23 – 2016-05-28 (×9): 100 mg via ORAL
  Filled 2016-05-23 (×10): qty 1

## 2016-05-23 MED ORDER — CEFAZOLIN SODIUM-DEXTROSE 2-4 GM/100ML-% IV SOLN
2.0000 g | INTRAVENOUS | Status: AC
  Administered 2016-05-23: 2 g via INTRAVENOUS

## 2016-05-23 MED ORDER — ONDANSETRON HCL 4 MG/2ML IJ SOLN
INTRAMUSCULAR | Status: DC | PRN
  Administered 2016-05-23: 4 mg via INTRAVENOUS

## 2016-05-23 MED ORDER — INSULIN ASPART 100 UNIT/ML ~~LOC~~ SOLN
0.0000 [IU] | Freq: Three times a day (TID) | SUBCUTANEOUS | Status: DC
  Administered 2016-05-24 – 2016-05-26 (×4): 2 [IU] via SUBCUTANEOUS
  Administered 2016-05-26: 8 [IU] via SUBCUTANEOUS
  Administered 2016-05-27: 2 [IU] via SUBCUTANEOUS
  Administered 2016-05-28: 5 [IU] via SUBCUTANEOUS

## 2016-05-23 MED ORDER — PHENYLEPHRINE 40 MCG/ML (10ML) SYRINGE FOR IV PUSH (FOR BLOOD PRESSURE SUPPORT)
PREFILLED_SYRINGE | INTRAVENOUS | Status: AC
  Filled 2016-05-23: qty 10

## 2016-05-23 MED ORDER — LACTATED RINGERS IV SOLN
INTRAVENOUS | Status: DC | PRN
  Administered 2016-05-23 (×2): via INTRAVENOUS

## 2016-05-23 MED ORDER — PROPOFOL 10 MG/ML IV BOLUS
INTRAVENOUS | Status: DC | PRN
  Administered 2016-05-23: 150 mg via INTRAVENOUS

## 2016-05-23 MED ORDER — NEOSTIGMINE METHYLSULFATE 5 MG/5ML IV SOSY
PREFILLED_SYRINGE | INTRAVENOUS | Status: AC
  Filled 2016-05-23: qty 5

## 2016-05-23 MED ORDER — ASPIRIN EC 325 MG PO TBEC: 325.0000 mg | DELAYED_RELEASE_TABLET | Freq: Every day | ORAL | Status: DC

## 2016-05-23 MED ORDER — HYDROCODONE-ACETAMINOPHEN 5-325 MG PO TABS
2.0000 | ORAL_TABLET | Freq: Once | ORAL | Status: AC
  Administered 2016-05-23: 2 via ORAL
  Filled 2016-05-23: qty 2

## 2016-05-23 MED ORDER — ONDANSETRON HCL 4 MG/2ML IJ SOLN
INTRAMUSCULAR | Status: AC
  Filled 2016-05-23: qty 2

## 2016-05-23 MED ORDER — SODIUM CHLORIDE 0.45 % IV SOLN
INTRAVENOUS | Status: DC
  Administered 2016-05-23: via INTRAVENOUS

## 2016-05-23 MED ORDER — 0.9 % SODIUM CHLORIDE (POUR BTL) OPTIME
TOPICAL | Status: DC | PRN
Start: 2016-05-23 — End: 2016-05-23
  Administered 2016-05-23: 1000 mL

## 2016-05-23 MED ORDER — LIDOCAINE HCL (CARDIAC) 20 MG/ML IV SOLN
INTRAVENOUS | Status: DC | PRN
  Administered 2016-05-23: 100 mg via INTRAVENOUS

## 2016-05-23 MED ORDER — GLYCOPYRROLATE 0.2 MG/ML IJ SOLN
INTRAMUSCULAR | Status: DC | PRN
  Administered 2016-05-23: 0.4 mg via INTRAVENOUS

## 2016-05-23 MED ORDER — MENTHOL 3 MG MT LOZG: 1.0000 | LOZENGE | OROMUCOSAL | Status: DC | PRN

## 2016-05-23 MED ORDER — METFORMIN HCL 500 MG PO TABS
500.0000 mg | ORAL_TABLET | Freq: Three times a day (TID) | ORAL | Status: DC
  Administered 2016-05-24: 500 mg via ORAL
  Filled 2016-05-23 (×2): qty 1

## 2016-05-23 MED ORDER — BUPIVACAINE HCL (PF) 0.25 % IJ SOLN
INTRAMUSCULAR | Status: AC
  Filled 2016-05-23: qty 30

## 2016-05-23 MED ORDER — ALBUTEROL SULFATE (2.5 MG/3ML) 0.083% IN NEBU: 3.0000 mL | INHALATION_SOLUTION | Freq: Four times a day (QID) | RESPIRATORY_TRACT | Status: DC | PRN

## 2016-05-23 MED ORDER — NEOSTIGMINE METHYLSULFATE 10 MG/10ML IV SOLN
INTRAVENOUS | Status: DC | PRN
  Administered 2016-05-23: 3 mg via INTRAVENOUS

## 2016-05-23 MED ORDER — PHENOL 1.4 % MT LIQD: 1.0000 | OROMUCOSAL | Status: DC | PRN

## 2016-05-23 MED ORDER — OXYCODONE-ACETAMINOPHEN 5-325 MG PO TABS
1.0000 | ORAL_TABLET | ORAL | Status: DC | PRN
  Administered 2016-05-24 – 2016-05-25 (×7): 2 via ORAL
  Administered 2016-05-26: 1 via ORAL
  Administered 2016-05-26 (×2): 2 via ORAL
  Administered 2016-05-26: 1 via ORAL
  Administered 2016-05-26 – 2016-05-28 (×6): 2 via ORAL
  Filled 2016-05-23: qty 1
  Filled 2016-05-23 (×13): qty 2
  Filled 2016-05-23: qty 1
  Filled 2016-05-23 (×3): qty 2

## 2016-05-23 MED ORDER — DIPHENHYDRAMINE HCL 12.5 MG/5ML PO ELIX
12.5000 mg | ORAL_SOLUTION | Freq: Four times a day (QID) | ORAL | Status: DC | PRN
  Administered 2016-05-23 – 2016-05-28 (×10): 12.5 mg via ORAL
  Filled 2016-05-23 (×11): qty 10

## 2016-05-23 MED ORDER — FLEET ENEMA 7-19 GM/118ML RE ENEM: 1.0000 | ENEMA | Freq: Once | RECTAL | Status: DC | PRN

## 2016-05-23 MED ORDER — SENNOSIDES-DOCUSATE SODIUM 8.6-50 MG PO TABS
1.0000 | ORAL_TABLET | Freq: Every evening | ORAL | Status: DC | PRN
  Administered 2016-05-25: 1 via ORAL
  Filled 2016-05-23: qty 1

## 2016-05-23 MED ORDER — METOCLOPRAMIDE HCL 5 MG/ML IJ SOLN: 5.0000 mg | Freq: Three times a day (TID) | INTRAMUSCULAR | Status: DC | PRN

## 2016-05-23 MED ORDER — BUPIVACAINE HCL (PF) 0.25 % IJ SOLN
INTRAMUSCULAR | Status: DC | PRN
  Administered 2016-05-23: 20 mL

## 2016-05-23 MED ORDER — ROCURONIUM BROMIDE 100 MG/10ML IV SOLN
INTRAVENOUS | Status: DC | PRN
  Administered 2016-05-23: 50 mg via INTRAVENOUS

## 2016-05-23 MED ORDER — FENTANYL CITRATE (PF) 100 MCG/2ML IJ SOLN
INTRAMUSCULAR | Status: DC | PRN
  Administered 2016-05-23: 50 ug via INTRAVENOUS
  Administered 2016-05-23: 100 ug via INTRAVENOUS
  Administered 2016-05-23: 50 ug via INTRAVENOUS

## 2016-05-23 MED ORDER — ONDANSETRON HCL 4 MG PO TABS: 4.0000 mg | ORAL_TABLET | Freq: Four times a day (QID) | ORAL | Status: DC | PRN

## 2016-05-23 MED ORDER — POVIDONE-IODINE 10 % EX SWAB
2.0000 "application " | Freq: Once | CUTANEOUS | Status: AC
  Administered 2016-05-23: 2 via TOPICAL

## 2016-05-23 MED ORDER — ONDANSETRON HCL 4 MG PO TABS
4.0000 mg | ORAL_TABLET | Freq: Once | ORAL | Status: AC
  Administered 2016-05-23: 4 mg via ORAL
  Filled 2016-05-23: qty 1

## 2016-05-23 MED ORDER — MORPHINE SULFATE (PF) 4 MG/ML IV SOLN
4.0000 mg | Freq: Once | INTRAVENOUS | Status: AC
  Administered 2016-05-23: 4 mg via INTRAVENOUS
  Filled 2016-05-23: qty 1

## 2016-05-23 MED ORDER — METHOCARBAMOL 500 MG PO TABS
500.0000 mg | ORAL_TABLET | Freq: Four times a day (QID) | ORAL | Status: DC | PRN
  Administered 2016-05-25 – 2016-05-28 (×10): 500 mg via ORAL
  Filled 2016-05-23 (×13): qty 1

## 2016-05-23 MED ORDER — MONTELUKAST SODIUM 10 MG PO TABS
10.0000 mg | ORAL_TABLET | Freq: Every day | ORAL | Status: DC
  Administered 2016-05-23 – 2016-05-27 (×5): 10 mg via ORAL
  Filled 2016-05-23 (×5): qty 1

## 2016-05-23 MED ORDER — AMLODIPINE BESYLATE 5 MG PO TABS
5.0000 mg | ORAL_TABLET | Freq: Every day | ORAL | Status: DC
  Administered 2016-05-24 – 2016-05-28 (×5): 5 mg via ORAL
  Filled 2016-05-23 (×5): qty 1

## 2016-05-23 MED ORDER — METOCLOPRAMIDE HCL 5 MG PO TABS: 5.0000 mg | ORAL_TABLET | Freq: Three times a day (TID) | ORAL | Status: DC | PRN

## 2016-05-23 MED ORDER — MORPHINE SULFATE (PF) 2 MG/ML IV SOLN: 2.0000 mg | INTRAVENOUS | Status: DC | PRN

## 2016-05-23 MED ORDER — ONDANSETRON HCL 4 MG/2ML IJ SOLN: 4.0000 mg | Freq: Four times a day (QID) | INTRAMUSCULAR | Status: DC | PRN

## 2016-05-23 MED ORDER — ONDANSETRON HCL 4 MG/2ML IJ SOLN
4.0000 mg | Freq: Once | INTRAMUSCULAR | Status: AC
  Administered 2016-05-23: 4 mg via INTRAVENOUS
  Filled 2016-05-23: qty 2

## 2016-05-23 MED ORDER — HYDROMORPHONE HCL 1 MG/ML IJ SOLN
0.2500 mg | INTRAMUSCULAR | Status: DC | PRN
  Administered 2016-05-23: 0.25 mg via INTRAVENOUS

## 2016-05-23 SURGICAL SUPPLY — 56 items
BIT DRILL CALIBRATED 4.3MMX365 (DRILL) ×2 IMPLANT
BIT DRILL CROWE PNT TWST 4.5MM (DRILL) ×2 IMPLANT
BLADE SURG 15 STRL LF DISP TIS (BLADE) IMPLANT
BLADE SURG 15 STRL SS (BLADE)
BNDG COHESIVE 4X5 TAN STRL (GAUZE/BANDAGES/DRESSINGS) ×3 IMPLANT
CANISTER SUCTION WELLS/JOHNSON (MISCELLANEOUS) ×3 IMPLANT
COVER BACK TABLE 60X90IN (DRAPES) IMPLANT
COVER MAYO STAND STRL (DRAPES) ×3 IMPLANT
COVER PERINEAL POST (MISCELLANEOUS) IMPLANT
COVER SURGICAL LIGHT HANDLE (MISCELLANEOUS) ×3 IMPLANT
DRAPE C-ARM 42X72 X-RAY (DRAPES) ×3 IMPLANT
DRAPE C-ARMOR (DRAPES) ×3 IMPLANT
DRAPE ORTHO SPLIT 87X125 STRL (DRAPES) ×6 IMPLANT
DRAPE STERI IOBAN 125X83 (DRAPES) IMPLANT
DRAPE UNIVERSAL PACK (DRAPES) ×3 IMPLANT
DRILL CALIBRATED 4.3MMX365 (DRILL) ×3
DRILL CROWE POINT TWIST 4.5MM (DRILL) ×3
DRSG ADAPTIC 3X8 NADH LF (GAUZE/BANDAGES/DRESSINGS) ×3 IMPLANT
DRSG PAD ABDOMINAL 8X10 ST (GAUZE/BANDAGES/DRESSINGS) ×3 IMPLANT
DURAPREP 26ML APPLICATOR (WOUND CARE) ×3 IMPLANT
ELECT REM PT RETURN 9FT ADLT (ELECTROSURGICAL) ×3
ELECTRODE REM PT RTRN 9FT ADLT (ELECTROSURGICAL) ×2 IMPLANT
EVACUATOR 1/8 PVC DRAIN (DRAIN) IMPLANT
GAUZE SPONGE 4X4 12PLY STRL (GAUZE/BANDAGES/DRESSINGS) ×3 IMPLANT
GLOVE BIOGEL PI IND STRL 8 (GLOVE) ×2 IMPLANT
GLOVE BIOGEL PI INDICATOR 8 (GLOVE) ×1
GLOVE ORTHO TXT STRL SZ7.5 (GLOVE) ×3 IMPLANT
GOWN STRL REUS W/ TWL LRG LVL3 (GOWN DISPOSABLE) ×4 IMPLANT
GOWN STRL REUS W/ TWL XL LVL3 (GOWN DISPOSABLE) ×2 IMPLANT
GOWN STRL REUS W/TWL 2XL LVL3 (GOWN DISPOSABLE) ×3 IMPLANT
GOWN STRL REUS W/TWL LRG LVL3 (GOWN DISPOSABLE) ×2
GOWN STRL REUS W/TWL XL LVL3 (GOWN DISPOSABLE) ×1
GUIDEPIN 3.2X17.5 THRD DISP (PIN) ×6 IMPLANT
GUIDEWIRE BEAD TIP (WIRE) ×3 IMPLANT
IMMOBILIZER KNEE 22 UNIV (SOFTGOODS) ×3 IMPLANT
KIT BASIN OR (CUSTOM PROCEDURE TRAY) ×3 IMPLANT
KIT ROOM TURNOVER OR (KITS) ×3 IMPLANT
LINER BOOT UNIVERSAL DISP (MISCELLANEOUS) IMPLANT
MANIFOLD NEPTUNE II (INSTRUMENTS) IMPLANT
NAIL FEM RETRO 9X360 (Nail) ×3 IMPLANT
NS IRRIG 1000ML POUR BTL (IV SOLUTION) ×3 IMPLANT
PACK GENERAL/GYN (CUSTOM PROCEDURE TRAY) ×3 IMPLANT
PAD ARMBOARD 7.5X6 YLW CONV (MISCELLANEOUS) ×6 IMPLANT
PADDING CAST COTTON 6X4 STRL (CAST SUPPLIES) ×3 IMPLANT
SCREW CORT TI DBL LEAD 5X32 (Screw) ×3 IMPLANT
SCREW CORT TI DBL LEAD 5X34 (Screw) ×3 IMPLANT
SCREW CORT TI DBL LEAD 5X65 (Screw) ×9 IMPLANT
SCREW CORT TI DBL LEAD 5X70 (Screw) ×6 IMPLANT
SCREW CORT TI DBL LEAD 5X75 (Screw) ×3 IMPLANT
STAPLER VISISTAT 35W (STAPLE) ×6 IMPLANT
SUT VIC AB 0 CT1 27 (SUTURE) ×1
SUT VIC AB 0 CT1 27XBRD ANBCTR (SUTURE) ×2 IMPLANT
SUT VIC AB 1 CT1 27 (SUTURE) ×3
SUT VIC AB 1 CT1 27XBRD ANBCTR (SUTURE) ×6 IMPLANT
SUT VIC AB 2-0 CT1 27 (SUTURE) ×1
SUT VIC AB 2-0 CT1 TAPERPNT 27 (SUTURE) ×2 IMPLANT

## 2016-05-23 NOTE — Brief Op Note (Signed)
05/23/2016  9:13 PM  PATIENT:  Debbie Mullins  61 y.o. female  PRE-OPERATIVE DIAGNOSIS:  Right Supracondylar Femur Fracture  POST-OPERATIVE DIAGNOSIS:  Right Supracondylar Femur Fracture  PROCEDURE:  Procedure(s): INTRAMEDULLARY (IM) RETROGRADE FEMORAL NAILING (Right) Biomet with prox and distal interlocks  SURGEON:  Surgeon(s) and Role:    * [name], MD - Primary  PHYSICIAN ASSISTANT:   ASSISTANTS: April Fulp RNFA  ANESTHESIA:   local and general  EBL:  Total I/O In: 1000 [I.V.:1000] Out: -   BLOOD ADMINISTERED:none  DRAINS: none   LOCAL MEDICATIONS USED:  MARCAINE     SPECIMEN:  No Specimen  DISPOSITION OF SPECIMEN:  N/A  COUNTS:  YES  TOURNIQUET:  * No tourniquets in log *  DICTATION: .Dragon Dictation  PLAN OF CARE: Admit to inpatient   PATIENT DISPOSITION:  PACU - hemodynamically stable.   Delay start of Pharmacological VTE agent (>24hrs) due to surgical blood loss or risk of bleeding: yes

## 2016-05-23 NOTE — Interval H&P Note (Signed)
History and Physical Interval Note:  05/23/2016 5:19 PM  Debbie Mullins  has presented today for surgery, with the diagnosis of Right Supracondylar Femur Fracture  The various methods of treatment have been discussed with the patient and family. After consideration of risks, benefits and other options for treatment, the patient has consented to  Procedure(s): INTRAMEDULLARY (IM) NAIL FEMORAL (Right) as a surgical intervention .  The patient's history has been reviewed, patient examined, no change in status, stable for surgery.  I have reviewed the patient's chart and labs.  Questions were answered to the patient's satisfaction.     [name]

## 2016-05-23 NOTE — ED Notes (Signed)
[facility]link arrived  

## 2016-05-23 NOTE — Interval H&P Note (Signed)
History and Physical Interval Note:  05/23/2016 5:18 PM  Debbie Mullins  has presented today for surgery, with the diagnosis of Right Supracondylar Femur Fracture  The various methods of treatment have been discussed with the patient and family. After consideration of risks, benefits and other options for treatment, the patient has consented to  Procedure(s): INTRAMEDULLARY (IM) NAIL FEMORAL (Right) as a surgical intervention .  The patient's history has been reviewed, patient examined, no change in status, stable for surgery.  I have reviewed the patient's chart and labs.  Questions were answered to the patient's satisfaction.     [name]

## 2016-05-23 NOTE — Transfer of Care (Signed)
Immediate Anesthesia Transfer of Care Note  Patient: Debbie Mullins  Procedure(s) Performed: Procedure(s): INTRAMEDULLARY (IM) RETROGRADE FEMORAL NAILING (Right)  Patient Location: PACU  Anesthesia Type:General  Level of Consciousness: awake  Airway & Oxygen Therapy: Patient Spontanous Breathing and Patient connected to face mask oxygen  Post-op Assessment: Report given to RN and Post -op Vital signs reviewed and stable  Post vital signs: Reviewed and stable  Last Vitals:  Vitals:   05/23/16 1512 05/23/16 1624  BP: 140/69 140/67  Pulse: 86 77  Resp: 19 18  Temp: 36.7 C 37.1 C    Last Pain:  Vitals:   05/23/16 1624  TempSrc: Oral  PainSc:          Complications: No apparent anesthesia complications

## 2016-05-23 NOTE — Anesthesia Preprocedure Evaluation (Signed)
Anesthesia Evaluation  Patient identified by MRN, date of birth, ID band Patient awake    Reviewed: Allergy & Precautions  Airway Mallampati: I  TM Distance: >3 FB     Dental   Pulmonary shortness of breath, asthma , pneumonia, COPD, Current Smoker,    breath sounds clear to auscultation       Cardiovascular hypertension, +CHF   Rhythm:Regular Rate:Normal     Neuro/Psych    GI/Hepatic Neg liver ROS, GERD  ,  Endo/Other  diabetes  Renal/GU Renal disease     Musculoskeletal   Abdominal   Peds  Hematology  (+) anemia ,   Anesthesia Other Findings   Reproductive/Obstetrics                             Anesthesia Physical Anesthesia Plan  ASA: III  Anesthesia Plan: General   Post-op Pain Management:    Induction: Intravenous  Airway Management Planned: Oral ETT  Additional Equipment:   Intra-op Plan:   Post-operative Plan:   Informed Consent: I have reviewed the patients History and Physical, chart, labs and discussed the procedure including the risks, benefits and alternatives for the proposed anesthesia with the patient or authorized representative who has indicated his/her understanding and acceptance.   Dental advisory given  Plan Discussed with: CRNA, Anesthesiologist and Surgeon  Anesthesia Plan Comments:         Anesthesia Quick Evaluation

## 2016-05-23 NOTE — Anesthesia Procedure Notes (Signed)
Procedure Name: Intubation Date/Time: 05/23/2016 7:22 PM Performed by: Purvis Kilts Pre-anesthesia Checklist: Patient identified, Emergency Drugs available, Suction available, Patient being monitored and Timeout performed Patient Re-evaluated:Patient Re-evaluated prior to inductionOxygen Delivery Method: Circle system utilized Preoxygenation: Pre-oxygenation with 100% oxygen Intubation Type: IV induction Ventilation: Mask ventilation without difficulty Laryngoscope Size: Mac and 3 Grade View: Grade I Tube type: Oral Tube size: 7.0 mm Number of attempts: 1 Placement Confirmation: ETT inserted through vocal cords under direct vision,  positive ETCO2 and breath sounds checked- equal and bilateral Secured at: 21 cm Tube secured with: Tape Dental Injury: Teeth and Oropharynx as per pre-operative assessment

## 2016-05-23 NOTE — Op Note (Addendum)
Preop diagnosis: right periprosthetic supracondylar distal femur  fracture above total knee arthroplasty  Postop diagnosis: Same  Procedure: Right retrograde femoral nail with proximal distal interlocks. Biomet 36010 mm nail.  Surgeon: Rodell Perna M.D.  Assistant: April Fulp RN FA  Anesthesia Gen. plus Marcaine local  EBL less than 100 mL  Tourniquet none  Brief history 61 year old female fell 2 days ago with pain in her knee which is going to the bathroom felt a sharp pop and was unable ambulate. She was seen and [facility] emergency room where x-rays demonstrated a fracture supracondylar. CT scan confirmed fracture supracondylar region adjacent to total knee arthroplasty. Patient's had bilateral total knee arthroplasties didn't knee revision with poly-exchange on the opposite left knee 3-4 years ago. Patient's ambulator with the walker.  Procedure: After standard prepping draping was induction general anesthesia Ancef prophylaxis DuraPrep was used down to the ankle split sheets drapes impervious stockinette Caban sterile skin marker outlining the old midline knee incision and Betadine Steri-Drape was applied. Timeout procedure was completed.  Old incision was opened initially starting the midportion of the patella extending down distal. Patient's patella was low initially a split was made in the patellar tendon some subcutaneous fat was excised the fingertip was introduced and there was insufficient room with the patella and the polyethylene in order to get exposure without damaging the polyethylene. Split the patellar tendon was closed with #1 intermittent interrupted Vicryl sutures. Incision was extended proximally and a medial parapatellar incision was made completing the arthrotomy and subluxing the patella over the lateral condyle. Patella component was satisfactory. This allowed exposure intercondylar with direct visualization able to flex the knee enough so that the Polly of the tibia  was not damaged. Pin was drilled up checked under AP and lateral C-arm using the C-armor sterile side drape with Velcro. Initial reaming was performed over the guidewire. She had a very tight isthmus and an 18/2 got some initial chatter tinnitus reamed to 10 and inserted a 9 nail based on depth gauge measurements with the tip of the rod at the lesser trochanter. Nail was inserted checked on the lateral x-ray at the knee to make sure was just countersunk 1 mm. Interlocks were placed 4 into the interlock spell stopped the nail but had bicortical contact holding the fracture. It was abutting against the edge of the nail and additional screws placed through the guide to guide a gotten slightly loose was tightened back down and screws are placed through the nail pictures were taken oblique with rotation to make sure all 4 interlock screws went to the nail with the additional 2 screws adjacent to the nail bed lagging across the fracture site. Proximal interlock was used to free hand technique 32 mm bicortical with tight fit. She had very thick cortices proximally. Copious irrigation followed by #1 Vicryl closure of the medial parapatellar incision 2-0 Vicryl subtendinous tissue skin staple closure and staples on all of entry portals for the nail proximal as well as the distal obliques and 2 lateral distance. Postoperative dressing was applied and knee immobilizer.

## 2016-05-23 NOTE — Anesthesia Postprocedure Evaluation (Signed)
Anesthesia Post Note  Patient: Debbie Mullins  Procedure(s) Performed: Procedure(s) (LRB): INTRAMEDULLARY (IM) RETROGRADE FEMORAL NAILING (Right)  Patient location during evaluation: PACU Anesthesia Type: General Level of consciousness: awake Pain management: pain level controlled Respiratory status: spontaneous breathing Cardiovascular status: stable Anesthetic complications: no       Last Vitals:  Vitals:   05/23/16 2228 05/23/16 2245  BP: (!) 150/71 (!) 156/69  Pulse: 80 80  Resp: 13 13  Temp: 36.4 C 36.4 C    Last Pain:  Vitals:   05/23/16 2245  TempSrc: Oral  PainSc:                  Angee Gupton

## 2016-05-23 NOTE — ED Triage Notes (Addendum)
Pt reports was walking to the bathroom yesterday (05/22/16). Pt reports while ambulating reports right knee "popped" and gave out. Pt reports called EMS yesterday and reports they assisted her out of the floor. Pt reports pain but reports " I thought I was okay but pain is still there and I haven't been able to walk on it ever since." no new deformity noted. Pt reports bilateral knee replacement several years ago. Pt denies being on blood thinners or LOC. PA at bedside assessing patient at time of EMS arrival.

## 2016-05-23 NOTE — H&P (Signed)
Debbie Mullins is an 61 y.o. female.   Chief Complaint: fall 05/21/16 with right periprothetic supracondylar femur fracture.  HPI:  [place] Monday at home going to bathroom, uses a walker.   Past Medical History:  Diagnosis Date  . Anemia   . Asthma   . CHF (congestive heart failure) (HCC)    takes Furosemide daily   . COPD (chronic obstructive pulmonary disease) (HCC)    Albuterol inhaler prn;SIngulair at night  . Depression    takes Wellbutrin daily  . Diabetes mellitus    takes Metformin daily  . GERD (gastroesophageal reflux disease)    takes Nexium daily  . History of colon polyps    benign  . Hypertension    takes Lisinopril daily  . Joint pain   . Joint swelling   . Leukocytosis 02/09/2011  . Neck pain    HNP  . Normocytic anemia 02/09/2011  . Peripheral edema    takes Furosemide daily  . Pneumonia    many yrs ago  . Pulmonary nodules/lesions, multiple 08/09/2013  . Shortness of breath dyspnea    with exertion  . Thrombocytosis ([place]) 02/09/2011  . Urinary frequency   . Urinary urgency   . Weakness    numbness and tingling in both hands    Past Surgical History:  Procedure Laterality Date  . ANTERIOR CERVICAL DECOMP/DISCECTOMY FUSION N/A 11/17/2014   Procedure: Cervical four- cervical five Anterior Cervical Decompression with fusion and bonegraft;  Surgeon: Ashok Pall, MD;  Location: [facility];  Service: Neurosurgery;  Laterality: N/A;  C45 anterior cervical decompression with fusion plating and bonegraft  . COLONOSCOPY    . KNEE ARTHROPLASTY Left 08/10/2013   Procedure:  TOTAL KNEE ARTHROPLASTY;  Surgeon: [name], MD;  Location: [facility];  Service: Orthopedics;  Laterality: Left;  Left Total Knee Arthroplasty Revision, Cemented, Semi-constrained,   . LEFT AND RIGHT HEART CATHETERIZATION WITH CORONARY ANGIOGRAM N/A 01/07/2012   Procedure: LEFT AND RIGHT HEART CATHETERIZATION WITH CORONARY ANGIOGRAM;  Surgeon: Thayer Headings, MD;  [facility];   Service: Cardiovascular;  Laterality: N/A;  . TONSILLECTOMY     as a child  . TOTAL KNEE ARTHROPLASTY Bilateral     Family History  Problem Relation Age of Onset  . Heart failure Mother   . Cancer Maternal Grandmother    Social History:  reports that she has been smoking Cigarettes.  She has a 20.50 pack-year smoking history. She has never used smokeless tobacco. She reports that she drinks alcohol. She reports that she does not use drugs.  Allergies: No Known Allergies  Medications Prior to Admission  Medication Sig Dispense Refill  . acetaminophen (TYLENOL) 500 MG tablet Take 500 mg by mouth every 6 (six) hours as needed for mild pain.     Marland Kitchen albuterol (PROVENTIL HFA;VENTOLIN HFA) 108 (90 BASE) MCG/ACT inhaler Inhale 2 puffs into the lungs every 6 (six) hours as needed. For shortness of breath or wheezing    . amLODipine (NORVASC) 5 MG tablet Take 5 mg by mouth daily.    Marland Kitchen aspirin 81 MG tablet Take 81 mg by mouth daily.    Marland Kitchen aspirin EC 325 MG tablet Take 1 tablet (325 mg total) by mouth daily. 30 tablet 0  . buPROPion (WELLBUTRIN XL) 150 MG 24 hr tablet Take 150 mg by mouth daily.    . celecoxib (CELEBREX) 200 MG capsule Take 200 mg by mouth daily.    Marland Kitchen docusate sodium 100 MG CAPS Take  100 mg by mouth 2 (two) times daily. 10 capsule 0  . esomeprazole (NEXIUM) 40 MG capsule Take 40 mg by mouth daily before breakfast.     . etodolac (LODINE) 400 MG tablet Take 400 mg by mouth 2 (two) times daily. Reported on 04/15/2015  1  . furosemide (LASIX) 20 MG tablet Take 1 tablet (20 mg total) by mouth every other day. 30 tablet 11  . metFORMIN (GLUCOPHAGE) 500 MG tablet Take 500 mg by mouth 3 (three) times daily.    . montelukast (SINGULAIR) 10 MG tablet Take 10 mg by mouth at bedtime.    . senna-docusate (SENOKOT-S) 8.6-50 MG per tablet Take 1 tablet by mouth at bedtime as needed for mild constipation.    Marland Kitchen dexamethasone (DECADRON) 4 MG tablet Take 4 mg by mouth daily.  0  .  HYDROcodone-acetaminophen (NORCO) 10-325 MG per tablet Take 1 tablet by mouth every 6 (six) hours as needed for moderate pain. Reported on 04/15/2015    . HYDROcodone-acetaminophen (NORCO/VICODIN) 5-325 MG per tablet Take 1 tablet by mouth every 6 (six) hours as needed for moderate pain. (Patient not taking: Reported on 04/15/2015) 70 tablet 0  . lisinopril (PRINIVIL,ZESTRIL) 10 MG tablet Take 10 mg by mouth daily.     . methocarbamol (ROBAXIN) 500 MG tablet Take 1 tablet (500 mg total) by mouth every 6 (six) hours as needed for muscle spasms (spasm). (Patient not taking: Reported on 11/10/2014) 30 tablet 0  . methocarbamol (ROBAXIN-750) 750 MG tablet Take 1 tablet (750 mg total) by mouth every 6 (six) hours as needed for muscle spasms. (Patient not taking: Reported on 04/15/2015) 60 tablet 0  . nicotine (NICODERM CQ - DOSED IN MG/24 HOURS) 21 mg/24hr patch Place 1 patch onto the skin daily. Uses occasionally    . oxyCODONE-acetaminophen (ROXICET) 5-325 MG per tablet Take 1-2 tablets by mouth every 4 (four) hours as needed. (Patient not taking: Reported on 11/10/2014) 60 tablet 0    Results for orders placed or performed during the hospital encounter of 05/23/16 (from the past 48 hour(s))  Basic metabolic panel     Status: Abnormal   Collection Time: 05/23/16 10:51 AM  Result Value Ref Range   Sodium 135 135 - 145 mmol/L   Potassium 4.7 3.5 - 5.1 mmol/L   Chloride 101 101 - 111 mmol/L   CO2 25 22 - 32 mmol/L   Glucose, Bld 106 (H) 65 - 99 mg/dL   BUN 25 (H) 6 - 20 mg/dL   Creatinine, Ser 1.10 (H) 0.44 - 1.00 mg/dL   Calcium 9.0 8.9 - 10.3 mg/dL   GFR calc non Af Amer 54 (L) >60 mL/min   GFR calc Af Amer >60 >60 mL/min    Comment: (NOTE) The eGFR has been calculated using the CKD EPI equation. This calculation has not been validated in all clinical situations. eGFR's persistently <60 mL/min signify possible Chronic Kidney Disease.    Anion gap 9 5 - 15  CK     Status: None   Collection Time:  05/23/16 10:51 AM  Result Value Ref Range   Total CK 114 38 - 234 U/L  CBC with Differential     Status: Abnormal   Collection Time: 05/23/16 10:51 AM  Result Value Ref Range   WBC 9.2 4.0 - 10.5 K/uL   RBC 3.83 (L) 3.87 - 5.11 MIL/uL   Hemoglobin 11.3 (L) 12.0 - 15.0 g/dL   HCT 34.2 (L) 36.0 - 46.0 %   MCV  89.3 78.0 - 100.0 fL   MCH 29.5 26.0 - 34.0 pg   MCHC 33.0 30.0 - 36.0 g/dL   RDW 17.2 (H) 11.5 - 15.5 %   Platelets 351 150 - 400 K/uL   Neutrophils Relative % 60 %   Neutro Abs 5.5 1.7 - 7.7 K/uL   Lymphocytes Relative 30 %   Lymphs Abs 2.7 0.7 - 4.0 K/uL   Monocytes Relative 8 %   Monocytes Absolute 0.7 0.1 - 1.0 K/uL   Eosinophils Relative 2 %   Eosinophils Absolute 0.2 0.0 - 0.7 K/uL   Basophils Relative 0 %   Basophils Absolute 0.0 0.0 - 0.1 K/uL  Protime-INR     Status: None   Collection Time: 05/23/16 11:15 AM  Result Value Ref Range   Prothrombin Time 13.4 11.4 - 15.2 seconds   INR 1.02   APTT     Status: None   Collection Time: 05/23/16 11:15 AM  Result Value Ref Range   aPTT 36 24 - 36 seconds  CBG monitoring, ED     Status: None   Collection Time: 05/23/16  2:57 PM  Result Value Ref Range   Glucose-Capillary 88 65 - 99 mg/dL   Dg Chest 1 View  Result Date: 05/23/2016 CLINICAL DATA:  Preop for knee surgery. EXAM: CHEST 1 VIEW COMPARISON:  08/05/2013 FINDINGS: High-riding humeral heads, consistent with chronic rotator cuff insufficiency. Cervical spine fixation. Midline trachea. Normal heart size. Atherosclerosis in the transverse aorta. No pleural effusion or pneumothorax. Low lung volumes. Bibasilar volume loss. No lobar consolidation. IMPRESSION: Low lung volumes, without acute disease. Aortic atherosclerosis. Electronically Signed   By: Abigail Miyamoto M.D.   On: 05/23/2016 12:12   Ct Knee Right Wo Contrast  Result Date: 05/23/2016 CLINICAL DATA:  Status post fall yesterday with onset of right knee pain. Right knee fracture by plain films this same day.  Initial encounter. EXAM: CT OF THE RIGHT KNEE WITHOUT CONTRAST TECHNIQUE: Multidetector CT imaging of the right knee was performed according to the standard protocol. Multiplanar CT image reconstructions were also generated. COMPARISON:  Plain films right knee this same day. FINDINGS: Bones/Joint/Cartilage Right knee arthroplasty is in place. As seen on the comparison plain films, there is a fracture through the metaphysis of the right femur. Fracture through the lateral cortex is approximately 6.5 cm above the articular surface of the patient's prosthesis. The fracture extends in an oblique and inferior orientation through the medial metaphysis approximately 5 cm above the articular surface. The fracture shows valgus angulation of approximately 21 degrees. Fragment override laterally of 1 cm is identified. Bones are markedly osteopenic. No other fracture is identified. Patient motion causes some artifact in the proximal diaphysis of the tibia on the lateral side. Ligaments Suboptimally assessed by CT. Muscles and Tendons Appear intact. Soft tissues No acute abnormality. IMPRESSION: Periprosthetic fracture distal right femur with approximately 21 degrees of valgus angulation and mild impaction as described above. Marked osteopenia. Electronically Signed   By: Inge Rise M.D.   On: 05/23/2016 12:52   Dg Knee Complete 4 Views Right  Result Date: 05/23/2016 CLINICAL DATA:  Right hip and knee pain due to a fall yesterday. Initial encounter. EXAM: RIGHT KNEE - COMPLETE 4+ VIEW COMPARISON:  None. FINDINGS: The patient has a total knee arthroplasty in place. There is a periprosthetic fracture of the distal femur with mild impaction of approximately 1 cm on the lateral side. No true lateral view is provided but no obvious anterior or posterior  displacement is seen. Bones are osteopenic. IMPRESSION: Acute mildly impacted periprosthetic fracture distal right femur. Osteopenia. Electronically Signed   By: Inge Rise M.D.   On: 05/23/2016 10:55   Dg Hip Unilat W Or Wo Pelvis 2-3 Views Right  Result Date: 05/23/2016 CLINICAL DATA:  Pain following fall EXAM: DG HIP (WITH OR WITHOUT PELVIS) 2-3V RIGHT COMPARISON:  None. FINDINGS: Frontal pelvis as well as frontal and lateral right hip images were obtained. There is no acute fracture or dislocation. There is advanced arthropathy in the right hip joint with remodeling of the right acetabulum. There is remodeling of the right femoral head with flattening and sclerosis with subchondral cystic change. 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 There is relatively mild narrowing of the left hip joint. No erosive change on the left. There is lower lumbar levoscoliosis. IMPRESSION: No acute fracture or dislocation. 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 Remodeling of the acetabulum on the right is indicative of the chronic nature of the arthropathy and avascular necrosis changes on the right. There is mild osteoarthritic change in the left hip joint. Electronically Signed   By: Lowella Grip III M.D.   On: 05/23/2016 10:57    Review of Systems  HENT: Negative.   Eyes: Negative for blurred vision and double vision.  Respiratory:       Smokes one half pack per day time 40 yrs  Cardiovascular:       Hx of mild CHF 2 yrs ago  Gastrointestinal: Negative.   Genitourinary: Negative.   Musculoskeletal:       Previous bilat. TKA , revision left knee 2014 for poly wear and subluxation  Skin: Negative.   Neurological: Positive for weakness. Negative for dizziness, tremors and headaches.  Endo/Heme/Allergies:       Positive for diabetes, on oral medication  Psychiatric/Behavioral: Positive for depression.    Blood pressure 140/67, pulse 77, temperature 98.7 F (37.1 C), temperature source Oral, resp. rate 18, height 5' 3"  (1.6 m), weight 198 lb  (89.8 kg), last menstrual period 07/03/2011, SpO2 100 %. Physical Exam  Constitutional: She is oriented to person, place, and time. She appears well-developed and well-nourished.  HENT:  Head: Normocephalic and atraumatic.  Eyes: Conjunctivae are normal. Pupils are equal, round, and reactive to light.  Neck: Normal range of motion. Neck supple. No thyromegaly present.  Cardiovascular: Normal rate and regular rhythm.   Respiratory: Effort normal and breath sounds normal. No respiratory distress. She has no wheezes.  GI: Soft. Bowel sounds are normal.  Musculoskeletal:  Pulses intact bilat LE,  Valgus 20 degrees left femur. Hemarthrosis left knee.   Neurological: She is alert and oriented to person, place, and time. No cranial nerve deficit.  Skin: Skin is warm and dry.  Psychiatric: She has a normal mood and affect. Her behavior is normal. Thought content normal.     Assessment/Plan Left distal femur fracture periprothetic. Plan retrograde femoral nail with interlock.   Procedure discussed. Medical Hx reviewed. Discussed care with MD at [facility] hospital. ?'s elicited and answered about plan of care.  Will need SNF post op.   [name], MD 05/23/2016, 5:08 PM

## 2016-05-23 NOTE — ED Provider Notes (Signed)
[facility] DEPT Provider Note   CSN: [id number] Arrival date & time: 05/23/16  5956     History   Chief Complaint Chief Complaint  Patient presents with  . Knee Pain    HPI Debbie Mullins is a 61 y.o. female.  Patient is a 61 year old female who presents to the emergency department by EMS because of knee pain.  The patient states that on yesterday March 13 she had a pop in her right knee, and the knee gave out. She states that she fell to the floor and had to call EMS to assist her to get out of the floor. She was on a chair, she continued to have pain but felt that she would be okay. She tried on different occasions to get up from the chair, but continued to have problems with pain. This morning she again tried to get out of the chair and noted severe pain. She called EMS again to bring her to the hospital. She attempted to see her orthopedic physician, but they were unable to see her today. The patient denies being on any anticoagulation medications. She's not had any recent injury or trauma to the right knee. It is of note that she had a knee replacement in 2012. It is also of note that she has a total knee replacement involving the left knee. Patient states she walks with a walker most of the time. She presents now for assistance with her pain and for evaluation because she cannot put weight on the right lower extremity.   The history is provided by the patient.  Knee Pain      Past Medical History:  Diagnosis Date  . Anemia   . Asthma   . CHF (congestive heart failure) (HCC)    takes Furosemide daily   . COPD (chronic obstructive pulmonary disease) (HCC)    Albuterol inhaler prn;SIngulair at night  . Depression    takes Wellbutrin daily  . Diabetes mellitus    takes Metformin daily  . GERD (gastroesophageal reflux disease)    takes Nexium daily  . History of colon polyps    benign  . Hypertension    takes Lisinopril daily  . Joint pain   . Joint swelling     . Leukocytosis 02/09/2011  . Neck pain    HNP  . Normocytic anemia 02/09/2011  . Peripheral edema    takes Furosemide daily  . Pneumonia    many yrs ago  . Pulmonary nodules/lesions, multiple 08/09/2013  . Shortness of breath dyspnea    with exertion  . Thrombocytosis ([place]) 02/09/2011  . Urinary frequency   . Urinary urgency   . Weakness    numbness and tingling in both hands    Patient Active Problem List   Diagnosis Date Noted  . Osteoarthritis of spine with myelopathy, cervical region 11/17/2014  . Failed total left knee replacement ([place]) 08/10/2013  . Pulmonary nodules/lesions, multiple 08/09/2013  . HLD (hyperlipidemia) 01/22/2012  . Pulmonary hypertension 01/08/2012  . Acute on chronic diastolic heart failure ([place]) 01/05/2012  . Acute respiratory failure with hypoxia ([place]) 01/03/2012  . Pulmonary edema, acute ([place]) 01/03/2012  . 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 HTN (hypertension) 01/02/2012  . COPD with acute exacerbation ([place]) 01/02/2012  . Tobacco abuse 07/04/2011  . 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 Leukocytosis 02/09/2011  . Normocytic anemia 02/09/2011  . Mild Thrombocytosis 02/09/2011  . COPD (chronic obstructive pulmonary disease) ([place]) 02/09/2011  . Obesity  02/09/2011    Past Surgical History:  Procedure Laterality Date  . ANTERIOR CERVICAL DECOMP/DISCECTOMY FUSION N/A 11/17/2014   Procedure: Cervical four- cervical five Anterior Cervical Decompression with fusion and bonegraft;  Surgeon: Ashok Pall, MD;  Location: [facility];  Service: Neurosurgery;  Laterality: N/A;  C45 anterior cervical decompression with fusion plating and bonegraft  . COLONOSCOPY    . KNEE ARTHROPLASTY Left 08/10/2013   Procedure:  TOTAL KNEE ARTHROPLASTY;  Surgeon: [name], MD;  Location: [facility];  Service: Orthopedics;  Laterality: Left;  Left Total Knee Arthroplasty Revision, Cemented, Semi-constrained,   . LEFT AND RIGHT HEART CATHETERIZATION WITH CORONARY  ANGIOGRAM N/A 01/07/2012   Procedure: LEFT AND RIGHT HEART CATHETERIZATION WITH CORONARY ANGIOGRAM;  Surgeon: Thayer Headings, MD;  Location: [facility];  Service: Cardiovascular;  Laterality: N/A;  . TONSILLECTOMY     as a child  . TOTAL KNEE ARTHROPLASTY Bilateral     OB History    No data available       Home Medications    Prior to Admission medications   Medication Sig Start Date End Date Taking? Authorizing Provider  acetaminophen (TYLENOL) 500 MG tablet Take 500 mg by mouth every 6 (six) hours as needed for mild pain.     Historical Provider, MD  albuterol (PROVENTIL HFA;VENTOLIN HFA) 108 (90 BASE) MCG/ACT inhaler Inhale 2 puffs into the lungs every 6 (six) hours as needed. For shortness of breath or wheezing    Historical Provider, MD  aspirin 81 MG tablet Take 81 mg by mouth daily.    Historical Provider, MD  aspirin EC 325 MG tablet Take 1 tablet (325 mg total) by mouth daily. Patient not taking: Reported on 11/10/2014 08/10/13   Phillips [place], PA-C  aspirin EC 81 MG tablet Take 81 mg by mouth daily.    Historical Provider, MD  buPROPion (WELLBUTRIN XL) 150 MG 24 hr tablet Take 150 mg by mouth daily.    Historical Provider, MD  celecoxib (CELEBREX) 200 MG capsule Take 200 mg by mouth daily.    Historical Provider, MD  dexamethasone (DECADRON) 4 MG tablet Take 4 mg by mouth daily. 11/03/14   Historical Provider, MD  docusate sodium 100 MG CAPS Take 100 mg by mouth 2 (two) times daily. Patient not taking: Reported on 11/10/2014 08/12/13   Phillips Hay, PA-C  esomeprazole (NEXIUM) 40 MG capsule Take 40 mg by mouth daily before breakfast.     Historical Provider, MD  etodolac (LODINE) 400 MG tablet Take 400 mg by mouth 2 (two) times daily. Reported on 04/15/2015 10/26/14   Historical Provider, MD  furosemide (LASIX) 20 MG tablet Take 1 tablet (20 mg total) by mouth every other day. 04/01/13   Fay Records, MD  HYDROcodone-acetaminophen (NORCO) 10-325 MG per tablet Take 1 tablet by mouth  every 6 (six) hours as needed for moderate pain. Reported on 04/15/2015    Historical Provider, MD  HYDROcodone-acetaminophen (NORCO/VICODIN) 5-325 MG per tablet Take 1 tablet by mouth every 6 (six) hours as needed for moderate pain. Patient not taking: Reported on 04/15/2015 11/18/14   Ashok Pall, MD  lisinopril (PRINIVIL,ZESTRIL) 10 MG tablet Take 10 mg by mouth daily.     Historical Provider, MD  metFORMIN (GLUCOPHAGE) 500 MG tablet Take 500 mg by mouth 3 (three) times daily.    Historical Provider, MD  methocarbamol (ROBAXIN) 500 MG tablet Take 1 tablet (500 mg total) by mouth every 6 (six) hours as needed for muscle spasms (  spasm). Patient not taking: Reported on 11/10/2014 08/10/13   Phillips Hay, PA-C  methocarbamol (ROBAXIN-750) 750 MG tablet Take 1 tablet (750 mg total) by mouth every 6 (six) hours as needed for muscle spasms. Patient not taking: Reported on 04/15/2015 11/18/14   Ashok Pall, MD  montelukast (SINGULAIR) 10 MG tablet Take 10 mg by mouth at bedtime.    Historical Provider, MD  nicotine (NICODERM CQ - DOSED IN MG/24 HOURS) 21 mg/24hr patch Place 1 patch onto the skin daily. Uses occasionally 01/08/12   Jonetta Osgood, MD  oxyCODONE-acetaminophen (ROXICET) 5-325 MG per tablet Take 1-2 tablets by mouth every 4 (four) hours as needed. Patient not taking: Reported on 11/10/2014 08/10/13   Phillips Hay, PA-C  senna-docusate (SENOKOT-S) 8.6-50 MG per tablet Take 1 tablet by mouth at bedtime as needed for mild constipation. 08/12/13   Phillips Hay, PA-C    Family History Family History  Problem Relation Age of Onset  . Heart failure Mother   . Cancer Maternal Grandmother     Social History Social History  Substance Use Topics  . Smoking status: Current Every Day Smoker    Packs/day: 0.50    Years: 41.00    Types: Cigarettes  . Smokeless tobacco: Never Used  . Alcohol use Yes     Comment: wine     Allergies   Patient has no known allergies.   Review of Systems Review of  Systems  Constitutional: Negative for activity change.       All ROS Neg except as noted in HPI  HENT: Negative.   Eyes: Negative.   Respiratory: Negative for cough, shortness of breath and wheezing.   Cardiovascular: Negative for chest pain and palpitations.  Gastrointestinal: Negative for abdominal pain and blood in stool.  Genitourinary: Negative for dysuria, frequency and hematuria.  Musculoskeletal: Positive for arthralgias. Negative for back pain and neck pain.  Skin: Negative.   Neurological: Negative for dizziness, seizures and speech difficulty.  Hematological: Does not bruise/bleed easily.  Psychiatric/Behavioral: Negative for confusion and hallucinations.     Physical Exam Updated Vital Signs BP 162/80 (BP Location: Right Arm)   Pulse 96   Temp 98.6 F (37 C) (Oral)   Resp 18   Ht 5\' 3"  (1.6 m)   Wt 89.8 kg   LMP 07/03/2011   SpO2 96%   BMI 35.07 kg/m   Physical Exam  Constitutional: She is oriented to person, place, and time. She appears well-developed and well-nourished.  Non-toxic appearance.  HENT:  Head: Normocephalic.  Right Ear: Tympanic membrane and external ear normal.  Left Ear: Tympanic membrane and external ear normal.  Eyes: EOM and lids are normal. Pupils are equal, round, and reactive to light.  Neck: Normal range of motion. Neck supple. Carotid bruit is not present.  Cardiovascular: Normal rate, regular rhythm, normal heart sounds, intact distal pulses and normal pulses.   Pulmonary/Chest: Breath sounds normal. No respiratory distress.  Abdominal: Soft. Bowel sounds are normal. There is no tenderness. There is no guarding.  Musculoskeletal: Normal range of motion.  There is no palpable deformity of the right hip. There is no pain with movement of the right pelvis. There's no deformity of the right femur palpated. There is deformity of the right knee, however the patient states this is not new. The patient has pain with attempted flexion of the  right knee. There is swelling in the quadricep area. No effusion appreciated.  There is no deformity of the tibial or fibula area  on the right. The dorsalis pedis pulses 2+. There is full range of motion of the toes of the right lower extremity.  Lymphadenopathy:       Head (right side): No submandibular adenopathy present.       Head (left side): No submandibular adenopathy present.    She has no cervical adenopathy.  Neurological: She is alert and oriented to person, place, and time. She has normal strength. No cranial nerve deficit or sensory deficit.  Skin: Skin is warm and dry.  Psychiatric: She has a normal mood and affect. Her speech is normal.  Nursing note and vitals reviewed.    ED Treatments / Results  Labs (all labs ordered are listed, but only abnormal results are displayed) Labs Reviewed  BASIC METABOLIC PANEL  CK  URINALYSIS, ROUTINE W REFLEX MICROSCOPIC    EKG  EKG Interpretation None       Radiology No results found.  Procedures Procedures (including critical care time)  Medications Ordered in ED Medications  HYDROcodone-acetaminophen (NORCO/VICODIN) 5-325 MG per tablet 2 tablet (not administered)  ondansetron (ZOFRAN) tablet 4 mg (not administered)     Initial Impression / Assessment and Plan / ED Course  I have reviewed the triage vital signs and the nursing notes.  Pertinent labs & imaging results that were available during my care of the patient were reviewed by me and considered in my medical decision making (see chart for details).     **I have reviewed nursing notes, vital signs, and all appropriate lab and imaging results for this patient.*  Final Clinical Impressions(s) / ED Diagnoses MDM Vital signs within normal limits. Patient has pain with attempting to apply weight to the right lower extremity. There is pain with attempted movement of the right knee. X-ray of the knee and pelvis will be obtained. We'll also obtain basic metabolic  panel, CK, and urine as the patient states that she fell to the floor on yesterday, required EMS to assist her getting out of the floor, and then was in a chair all night until this morning. We'll evaluate for possible rhabdomyolysis.   X-ray of the right knee reveals an acute mildly impacted periprosthetic fracture of the distal right femur. X-ray of the right hip and pelvis reveals advanced arthropathy consistent with avascular necrosis. There is some remodeling of the acetabulum, there is no fracture appreciated at this time. There is also noted osteoarthritic changes of the left hip joint.  I discussed the case with Dr. Rodell Perna. A CT scan of the knee has been ordered. After the CT scan, the patient will be transferred to the [facility] emergency department for surgical intervention of this afternoon.    Final diagnoses:  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, initial encounter    New Prescriptions New Prescriptions   No medications on file     Lily Kocher, PA-C 05/24/16 [place], DO 05/27/16 1521

## 2016-05-23 NOTE — ED Notes (Signed)
Pt now leaving with [facility]

## 2016-05-23 NOTE — ED Notes (Addendum)
Pt taken to xray Pt stated it will be a long time before she can give a urine sample. Mentioned in and out cath and pt stated no. Water given with pills

## 2016-05-23 NOTE — H&P (Signed)
Addendum to history and physical.  Patient fell at home she had immediate pain in the distal right thigh pain with motion inability to walk. Pain was immediate as well as with deformity just above the knee on the right.  Patient's surgical care's comp carotid by history of COPD, history of heart failure acute on chronic, diabetes, history of falls, history of shortness of breath past thrombocytopenia although her platelet count is acceptable at this time. She is at increased risks and care of her medical problems was discussed with referring physician from [facility] prior to accepting her transfer. Risks of associated medical problems was discussed with patient in detail and with the adjacent prosthesis her surgical care is more complicated than standard femur fracture. All this was discussed in detail with the patient preoperatively and informed consent was obtained.

## 2016-05-24 ENCOUNTER — Encounter (HOSPITAL_COMMUNITY): Payer: Self-pay | Admitting: General Practice

## 2016-05-24 DIAGNOSIS — E860 Dehydration: Secondary | ICD-10-CM

## 2016-05-24 DIAGNOSIS — E875 Hyperkalemia: Secondary | ICD-10-CM

## 2016-05-24 LAB — PHOSPHORUS: Phosphorus: 4.5 mg/dL (ref 2.5–4.6)

## 2016-05-24 LAB — CBC
HCT: 28.9 % — ABNORMAL LOW (ref 36.0–46.0)
HCT: 30.5 % — ABNORMAL LOW (ref 36.0–46.0)
Hemoglobin: 9.3 g/dL — ABNORMAL LOW (ref 12.0–15.0)
Hemoglobin: 9.7 g/dL — ABNORMAL LOW (ref 12.0–15.0)
MCH: 28.6 pg (ref 26.0–34.0)
MCH: 28.7 pg (ref 26.0–34.0)
MCHC: 31.8 g/dL (ref 30.0–36.0)
MCHC: 32.2 g/dL (ref 30.0–36.0)
MCV: 89.2 fL (ref 78.0–100.0)
MCV: 90 fL (ref 78.0–100.0)
Platelets: 269 10*3/uL (ref 150–400)
Platelets: 310 10*3/uL (ref 150–400)
RBC: 3.24 MIL/uL — ABNORMAL LOW (ref 3.87–5.11)
RBC: 3.39 MIL/uL — ABNORMAL LOW (ref 3.87–5.11)
RDW: 16.8 % — ABNORMAL HIGH (ref 11.5–15.5)
RDW: 17.1 % — ABNORMAL HIGH (ref 11.5–15.5)
WBC: 14.1 10*3/uL — ABNORMAL HIGH (ref 4.0–10.5)
WBC: 8.3 10*3/uL (ref 4.0–10.5)

## 2016-05-24 LAB — URINALYSIS, ROUTINE W REFLEX MICROSCOPIC
Bilirubin Urine: NEGATIVE
Glucose, UA: NEGATIVE mg/dL
Hgb urine dipstick: NEGATIVE
Ketones, ur: NEGATIVE mg/dL
Nitrite: NEGATIVE
Protein, ur: NEGATIVE mg/dL
Specific Gravity, Urine: 1.012 (ref 1.005–1.030)
pH: 5 (ref 5.0–8.0)

## 2016-05-24 LAB — BASIC METABOLIC PANEL
Anion gap: 10 (ref 5–15)
Anion gap: 9 (ref 5–15)
BUN: 29 mg/dL — ABNORMAL HIGH (ref 6–20)
BUN: 20 mg/dL (ref 6–20)
CO2: 19 mmol/L — ABNORMAL LOW (ref 22–32)
CO2: 22 mmol/L (ref 22–32)
Calcium: 8.5 mg/dL — ABNORMAL LOW (ref 8.9–10.3)
Calcium: 8.1 mg/dL — ABNORMAL LOW (ref 8.9–10.3)
Chloride: 110 mmol/L (ref 101–111)
Chloride: 104 mmol/L (ref 101–111)
Creatinine, Ser: 1.44 mg/dL — ABNORMAL HIGH (ref 0.44–1.00)
Creatinine, Ser: 1.12 mg/dL — ABNORMAL HIGH (ref 0.44–1.00)
GFR calc Af Amer: 45 mL/min — ABNORMAL LOW (ref >60)
GFR calc Af Amer: >60 mL/min (ref >60)
GFR calc non Af Amer: 39 mL/min — ABNORMAL LOW (ref >60)
GFR calc non Af Amer: 53 mL/min — ABNORMAL LOW (ref >60)
Glucose, Bld: 120 mg/dL — ABNORMAL HIGH (ref 65–99)
Glucose, Bld: 110 mg/dL — ABNORMAL HIGH (ref 65–99)
Potassium: 6.7 mmol/L (ref 3.5–5.1)
Potassium: 5.4 mmol/L — ABNORMAL HIGH (ref 3.5–5.1)
Sodium: 139 mmol/L (ref 135–145)
Sodium: 135 mmol/L (ref 135–145)

## 2016-05-24 LAB — GLUCOSE, CAPILLARY
Glucose-Capillary: 115 mg/dL — ABNORMAL HIGH (ref 65–99)
Glucose-Capillary: 145 mg/dL — ABNORMAL HIGH (ref 65–99)
Glucose-Capillary: 139 mg/dL — ABNORMAL HIGH (ref 65–99)
Glucose-Capillary: 104 mg/dL — ABNORMAL HIGH (ref 65–99)

## 2016-05-24 LAB — MAGNESIUM: Magnesium: 1.5 mg/dL — ABNORMAL LOW (ref 1.7–2.4)

## 2016-05-24 LAB — POTASSIUM: Potassium: 5.1 mmol/L (ref 3.5–5.1)

## 2016-05-24 MED ORDER — WHITE PETROLATUM GEL
Status: AC
  Administered 2016-05-24: 1 via NASAL
  Filled 2016-05-24: qty 1

## 2016-05-24 MED ORDER — ALBUTEROL SULFATE (2.5 MG/3ML) 0.083% IN NEBU
2.5000 mg | INHALATION_SOLUTION | Freq: Once | RESPIRATORY_TRACT | Status: AC
  Administered 2016-05-24: 2.5 mg via RESPIRATORY_TRACT
  Filled 2016-05-24: qty 3

## 2016-05-24 MED ORDER — ENSURE ENLIVE PO LIQD
237.0000 mL | Freq: Two times a day (BID) | ORAL | Status: DC
  Administered 2016-05-24 – 2016-05-28 (×4): 237 mL via ORAL

## 2016-05-24 MED ORDER — SODIUM CHLORIDE 0.9 % IV SOLN
INTRAVENOUS | Status: AC
  Administered 2016-05-24: 13:00:00 via INTRAVENOUS

## 2016-05-24 MED ORDER — SODIUM CHLORIDE 0.9 % IV BOLUS (SEPSIS): 500.0000 mL | Freq: Once | INTRAVENOUS | Status: DC

## 2016-05-24 MED ORDER — BUPROPION HCL ER (XL) 150 MG PO TB24
150.0000 mg | ORAL_TABLET | Freq: Every day | ORAL | Status: DC
  Administered 2016-05-24 – 2016-05-28 (×5): 150 mg via ORAL
  Filled 2016-05-24 (×5): qty 1

## 2016-05-24 NOTE — Evaluation (Signed)
Physical Therapy Evaluation Patient Details Name: Debbie Mullins MRN: [id number] DOB: 1955-07-15 Today's Date: 05/24/2016   History of Present Illness  61 y.o. female admitted to [facility] on 05/23/16 for weakness, thrombocytosis, SOB, pulmonary nodules, peripherial edema, neck pain, HTN, DM, COPD, CHF, anemia, bil TKA, and ACDF (2016).   Clinical Impression  Pt was able to stand twice EOB with two person assist, but we did not feel safe turning to the chair without a third person (two on each side, one to support the R leg in NWB), so we used the maxi move lift instead to safely get her OOB to chair.  Pt will need SNF level rehab before returning home.  She prefers [facility] in [place].   PT to follow acutely for deficits listed below.       Follow Up Recommendations SNF;Other (comment) (wants [facility] in [place])    Financial risk analyst (measurements PT);Wheelchair cushion (measurements PT);Hospital bed    Recommendations for Other Services    NA    Precautions / Restrictions Precautions Precautions: Fall;Other (comment) Precaution Comments: No knee flexion ROM Required Braces or Orthoses: Knee Immobilizer - Right Knee Immobilizer - Right: On at all times Restrictions Weight Bearing Restrictions: Yes RLE Weight Bearing: Non weight bearing      Mobility  Bed Mobility Overal bed mobility: Needs Assistance Bed Mobility: Supine to Sit;Sit to Supine     Supine to sit: +2 for physical assistance;Mod assist;HOB elevated Sit to supine: +2 for physical assistance;Max assist   General bed mobility comments: Two person mod assist to get to sitting EOB with HOB elevated.  Assist provided to help progress right leg to EOB and at pelvis to help scoot to EOB.  Pt able to lift her trunk to semi long sitting unassisted and has limited use of arms to pull on bed rail due to pre morbid UE weakness.   Transfers Overall transfer level: Needs assistance Equipment used: Rolling  walker (2 wheeled) Transfers: Sit to/from Stand Sit to Stand: +2 physical assistance;Mod assist;From elevated surface         General transfer comment: Two person mod assist to stand twice at EOB with one therapist assisting the trunk to power up and second therapist holding right foot in NWB, stabilizing RW and assisting left knee into full extension.  We stood x 2 first time ~30 sec, second time <10 sec.   We would have needed a third person to safely pivot to the chair and pt was starting to fatigue EOB, so we positioned back in supine max assist to support both legs and trunk to return to flat bed and lifted her to the chair with the maxi move total lift.          Balance Overall balance assessment: Needs assistance Sitting-balance support: Feet supported;Bilateral upper extremity supported;No upper extremity supported Sitting balance-Leahy Scale: Fair     Standing balance support: Bilateral upper extremity supported Standing balance-Leahy Scale: Poor Standing balance comment: two person assist, reliance on bed and therapists for stability and use of RW.                              Pertinent Vitals/Pain Pain Assessment: 0-10 Pain Score: 6  Pain Location: right knee/leg Pain Descriptors / Indicators: Aching;Burning Pain Intervention(s): Limited activity within patient's tolerance;Monitored during session;Repositioned    Home Living Family/patient expects to be discharged to:: Private residence Living Arrangements: Other relatives;Other (Comment) (cousin) Available  Help at Discharge: Family;Other (Comment) (cousin works during the day) Type of Home: House Home Access: [place]: One [place]: Environmental consultant - 4 wheels;Hand held shower head;Bedside commode      Prior Function Level of Independence: Needs assistance   Gait / Transfers Assistance Needed: rollator at baseline  ADL's / [place] Needed: pt reports she  "makes do" with ADLs.         Hand Dominance   Dominant Hand: Left (originally right now left )    Extremity/Trunk Assessment   Upper Extremity Assessment Upper Extremity Assessment: Defer to OT evaluation    Lower Extremity Assessment Lower Extremity Assessment: RLE deficits/detail RLE Deficits / Details: right leg with normal post op pain and weakness,  ankle at least 3/5, knee NT due to immobilization and no flexion ROM allowed at this time, hip strength 1/5.   RLE: Unable to fully assess due to immobilization RLE Sensation:  (intact to LT in areas outside of KI)    Cervical / Trunk Assessment Cervical / Trunk Assessment: Other exceptions Cervical / Trunk Exceptions: per pt reoort her arm issues came orignially from her neck.   Communication   Communication: No difficulties  Cognition Arousal/Alertness: Awake/alert Behavior During Therapy: WFL for tasks assessed/performed Overall Cognitive Status: Within Functional Limits for tasks assessed                      General Comments General comments (skin integrity, edema, etc.): O2 sats stable on 2 L O2 [place] as well as HR, BP not taken as pt did not report any lightheadedness.     Exercises Other Exercises Other Exercises: encouraged ankle pumps and IS use, pt demonstrated both to therapists.    Assessment/Plan    PT Assessment Patient needs continued PT services  PT Problem List Decreased strength;Decreased range of motion;Decreased activity tolerance;Decreased balance;Decreased mobility;Decreased knowledge of use of DME;Decreased knowledge of precautions;Pain;Obesity       PT Treatment Interventions DME instruction;Gait training;Stair training;Functional mobility training;Therapeutic activities;Therapeutic exercise;Balance training;Patient/family education;Wheelchair mobility training;Modalities    PT Goals (Current goals can be found in the Care Plan section)  Acute Rehab PT Goals Patient Stated Goal: to get  back to her prior level of function.  PT Goal Formulation: With patient Time For Goal Achievement: 05/31/16 Potential to Achieve Goals: Good    Frequency Min 3X/week        Co-evaluation PT/OT/SLP Co-Evaluation/Treatment: Yes Reason for Co-Treatment: Complexity of the patient's impairments (multi-system involvement);For patient/therapist safety;To address functional/ADL transfers PT goals addressed during session: Mobility/safety with mobility;Balance;Proper use of DME;Strengthening/ROM         End of Session Equipment Utilized During Treatment: Gait belt;Right knee immobilizer;Oxygen Activity Tolerance: Patient limited by fatigue;Patient limited by pain Patient left: in chair;with call bell/phone within reach Nurse Communication: Mobility status;Need for lift equipment (to RN tech) PT Visit Diagnosis: History of falling (Z91.81);Muscle weakness (generalized) (M62.81);Difficulty in walking, not elsewhere classified (R26.2);Pain Pain - Right/Left: Right Pain - part of body: Leg         Time: 1427-1520 PT Time Calculation (min) (ACUTE ONLY): 53 min   Charges:   PT Evaluation $PT Eval Moderate Complexity: 1 Procedure PT Treatments $Therapeutic Activity: 8-22 mins         Toren Tucholski B. [place], [place], DPT [phone]   05/24/2016, 3:38 PM

## 2016-05-24 NOTE — Consult Note (Signed)
Medical Consultation   Debbie Mullins  YIR:[id number]  DOB: 03-Jun-1955  DOA: 05/23/2016  PCP: Elyn Peers, MD   Requesting physician: Dr. Lorin Mercy  Reason for consultation: hyperkalemia  History of Present Illness: Debbie Mullins is an 61 y.o. female with pmg of DM2, htn, COPD, CKD who presented to the ED on 05/23/16 after fall at home on day prior. Pt states her knee 'gave out'. She denies any syncopal event, no LOC, no recent chest pain, sob, abdominal pain, n/v/d. CT in ED revealed right femoreal fracture s/p repair on 05/23/16. Post op labs this am showed K+ 6.7 prompting consult to [facility].   Upon talking to pt, she states that after fall at home, she sat in recliner and little to eat or drink prior to transfer to ED. She did continue taking medications. Again, she denies any recent chest pain, sob or palpitations.    Review of Systems:  ROS As per HPI otherwise 10 point review of systems negative.     Past Medical History: Past Medical History:  Diagnosis Date  . Anemia   . Asthma   . CHF (congestive heart failure) (HCC)    takes Furosemide daily   . COPD (chronic obstructive pulmonary disease) (HCC)    Albuterol inhaler prn;SIngulair at night  . Depression    takes Wellbutrin daily  . Diabetes mellitus    takes Metformin daily  . GERD (gastroesophageal reflux disease)    takes Nexium daily  . History of colon polyps    benign  . Hypertension    takes Lisinopril daily  . Joint pain   . Joint swelling   . Leukocytosis 02/09/2011  . Neck pain    HNP  . Normocytic anemia 02/09/2011  . Peripheral edema    takes Furosemide daily  . Pneumonia    many yrs ago  . Pulmonary nodules/lesions, multiple 08/09/2013  . Shortness of breath dyspnea    with exertion  . Thrombocytosis ([place]) 02/09/2011  . Urinary frequency   . Urinary urgency   . Weakness    numbness and tingling in both hands    Past Surgical History: Past Surgical  History:  Procedure Laterality Date  . ANTERIOR CERVICAL DECOMP/DISCECTOMY FUSION N/A 11/17/2014   Procedure: Cervical four- cervical five Anterior Cervical Decompression with fusion and bonegraft;  Surgeon: Ashok Pall, MD;  Location: [facility];  Service: Neurosurgery;  Laterality: N/A;  C45 anterior cervical decompression with fusion plating and bonegraft  . COLONOSCOPY    . FEMUR IM NAIL Right 05/23/2016  . FEMUR IM NAIL Right 05/23/2016   Procedure: INTRAMEDULLARY (IM) RETROGRADE FEMORAL NAILING;  Surgeon: [name], MD;  Location: [place];  Service: Orthopedics;  Laterality: Right;  . KNEE ARTHROPLASTY Left 08/10/2013   Procedure:  TOTAL KNEE ARTHROPLASTY;  Surgeon: [name], MD;  Location: [place];  Service: Orthopedics;  Laterality: Left;  Left Total Knee Arthroplasty Revision, Cemented, Semi-constrained,   . LEFT AND RIGHT HEART CATHETERIZATION WITH CORONARY ANGIOGRAM N/A 01/07/2012   Procedure: LEFT AND RIGHT HEART CATHETERIZATION WITH CORONARY ANGIOGRAM;  Surgeon: Thayer Headings, MD;  Location: [facility];  Service: Cardiovascular;  Laterality: N/A;  . TONSILLECTOMY     as a child  . TOTAL KNEE ARTHROPLASTY Bilateral      Allergies:  No Known Allergies   Social History:  reports that she has been smoking Cigarettes.  She has  a 20.50 pack-year smoking history. She has never used smokeless tobacco. She reports that she drinks alcohol. She reports that she does not use drugs.   Family History: Family History  Problem Relation Age of Onset  . Heart failure Mother   . Cancer Maternal Grandmother      Physical Exam: Vitals:   05/24/16 0040 05/24/16 0405 05/24/16 0947 05/24/16 1013  BP: (!) 149/62 (!) 145/70 136/62   Pulse: 80 82 70   Resp: 13 13    Temp: 97.5 F (36.4 C) 97.5 F (36.4 C)    TempSrc: Oral Oral    SpO2: 95% 95% 99% 100%  Weight:      Height:        Constitutional:  Alert and awake, oriented x3, not in any acute distress. Eyes: EOMI, irises  appear normal, anicteric sclera,  ENMT: mucous membranes slightly dry, no lesions noted Neck: neck appears normal, no masses, normal ROM, no thyromegaly, no JVD  CVS: S1-S2 clear, no murmur rubs or gallops, no LE edema, normal pedal pulses  Respiratory:  CTAB, no wheezing, rales or rhonchi. Respiratory effort normal. No accessory muscle use.  Abdomen: soft nontender, nondistended, normal bowel sounds, no hepatosplenomegaly, no hernias  Neuro: Cranial nerves II-XII intact, strength, sensation, reflexes Psych: judgement and insight appear normal, stable mood and affect, mental status Skin: no rashes or lesions or ulcers, no induration or nodules    Data reviewed:  I have personally reviewed following labs and imaging studies Labs:  CBC:  Recent Labs Lab 05/23/16 1051 05/24/16 0010 05/24/16 0808  WBC 9.2 14.1* 8.3  NEUTROABS 5.5  --   --   HGB 11.3* 9.7* 9.3*  HCT 34.2* 30.5* 28.9*  MCV 89.3 90.0 89.2  PLT 351 310 253    Basic Metabolic Panel:  Recent Labs Lab 05/23/16 1051 05/24/16 0010 05/24/16 0808 05/24/16 1008  NA 135 139 135  --   K 4.7 6.7* 5.4*  --   CL 101 110 104  --   CO2 25 19* 22  --   GLUCOSE 106* 120* 110*  --   BUN 25* 29* 20  --   CREATININE 1.10* 1.44* 1.12*  --   CALCIUM 9.0 8.5* 8.1*  --   MG  --   --   --  1.5*  PHOS  --   --   --  4.5   GFR Estimated Creatinine Clearance: 58.3 mL/min (A) (by C-G formula based on SCr of 1.12 mg/dL (H)). Liver Function Tests: No results for input(s): AST, ALT, ALKPHOS, BILITOT, PROT, ALBUMIN in the last 168 hours. No results for input(s): LIPASE, AMYLASE in the last 168 hours. No results for input(s): AMMONIA in the last 168 hours. Coagulation profile  Recent Labs Lab 05/23/16 1115  INR 1.02    Cardiac Enzymes:  Recent Labs Lab 05/23/16 1051  CKTOTAL 114   BNP: Invalid input(s): POCBNP CBG:  Recent Labs Lab 05/23/16 1457 05/23/16 2139 05/24/16 0656  GLUCAP 88 101* 115*   D-Dimer No  results for input(s): DDIMER in the last 72 hours. Hgb A1c No results for input(s): HGBA1C in the last 72 hours. Lipid Profile No results for input(s): CHOL, HDL, LDLCALC, TRIG, CHOLHDL, LDLDIRECT in the last 72 hours. Thyroid function studies No results for input(s): TSH, T4TOTAL, T3FREE, THYROIDAB in the last 72 hours.  Invalid input(s): FREET3 Anemia work up No results for input(s): VITAMINB12, FOLATE, FERRITIN, TIBC, IRON, RETICCTPCT in the last 72 hours.  Microbiology Recent Results (from  the past 240 hour(s))  Surgical pcr screen     Status: None   Collection Time: 05/23/16  5:37 PM  Result Value Ref Range Status   MRSA, PCR NEGATIVE NEGATIVE Final   Staphylococcus aureus NEGATIVE NEGATIVE Final    Comment:        The Xpert SA Assay (FDA approved for NASAL specimens in patients over 74 years of age), is one component of a comprehensive surveillance program.  Test performance has been validated by [facility] for patients greater than or equal to 41 year old. It is not intended to diagnose infection nor to guide or monitor treatment.        Inpatient Medications:   Scheduled Meds: . amLODipine  5 mg Oral Daily  . aspirin EC  325 mg Oral Q breakfast  . docusate sodium  100 mg Oral BID  . feeding supplement (ENSURE ENLIVE)  237 mL Oral BID BM  . furosemide  20 mg Oral QODAY  . insulin aspart  0-15 Units Subcutaneous TID WC  . lisinopril  10 mg Oral Daily  . metFORMIN  500 mg Oral TID WC  . montelukast  10 mg Oral QHS  . pantoprazole  80 mg Oral Q1200  . sodium chloride  500 mL Intravenous Once  . white petrolatum       Continuous Infusions: . lactated ringers 10 mL/hr at 05/23/16 1701     Radiological Exams on Admission: Dg Chest 1 View  Result Date: 05/23/2016 CLINICAL DATA:  Preop for knee surgery. EXAM: CHEST 1 VIEW COMPARISON:  08/05/2013 FINDINGS: High-riding humeral heads, consistent with chronic rotator cuff insufficiency. Cervical spine fixation.  Midline trachea. Normal heart size. Atherosclerosis in the transverse aorta. No pleural effusion or pneumothorax. Low lung volumes. Bibasilar volume loss. No lobar consolidation. IMPRESSION: Low lung volumes, without acute disease. Aortic atherosclerosis. Electronically Signed   By: Abigail Miyamoto M.D.   On: 05/23/2016 12:12   Ct Knee Right Wo Contrast  Result Date: 05/23/2016 CLINICAL DATA:  Status post fall yesterday with onset of right knee pain. Right knee fracture by plain films this same day. Initial encounter. EXAM: CT OF THE RIGHT KNEE WITHOUT CONTRAST TECHNIQUE: Multidetector CT imaging of the right knee was performed according to the standard protocol. Multiplanar CT image reconstructions were also generated. COMPARISON:  Plain films right knee this same day. FINDINGS: Bones/Joint/Cartilage Right knee arthroplasty is in place. As seen on the comparison plain films, there is a fracture through the metaphysis of the right femur. Fracture through the lateral cortex is approximately 6.5 cm above the articular surface of the patient's prosthesis. The fracture extends in an oblique and inferior orientation through the medial metaphysis approximately 5 cm above the articular surface. The fracture shows valgus angulation of approximately 21 degrees. Fragment override laterally of 1 cm is identified. Bones are markedly osteopenic. No other fracture is identified. Patient motion causes some artifact in the proximal diaphysis of the tibia on the lateral side. Ligaments Suboptimally assessed by CT. Muscles and Tendons Appear intact. Soft tissues No acute abnormality. IMPRESSION: Periprosthetic fracture distal right femur with approximately 21 degrees of valgus angulation and mild impaction as described above. Marked osteopenia. Electronically Signed   By: Inge Rise M.D.   On: 05/23/2016 12:52   Dg Knee Complete 4 Views Right  Result Date: 05/23/2016 CLINICAL DATA:  Right hip and knee pain due to a fall  yesterday. Initial encounter. EXAM: RIGHT KNEE - COMPLETE 4+ VIEW COMPARISON:  None. FINDINGS: The  patient has a total knee arthroplasty in place. There is a periprosthetic fracture of the distal femur with mild impaction of approximately 1 cm on the lateral side. No true lateral view is provided but no obvious anterior or posterior displacement is seen. Bones are osteopenic. IMPRESSION: Acute mildly impacted periprosthetic fracture distal right femur. Osteopenia. Electronically Signed   By: Inge Rise M.D.   On: 05/23/2016 10:55   Dg C-arm 61-120 Min  Result Date: 05/23/2016 CLINICAL DATA:  Intramedullary nail of right femur. Closed right femur fracture. EXAM: RIGHT FEMUR 2 VIEWS; DG C-ARM 61-120 MIN COMPARISON:  CT right knee 05/23/2016. Right knee radiographs 05/23/2016. FINDINGS: Intraoperative fluoroscopy is obtained for surgical control purposes. Fluoroscopy time is recorded at 1 minute 17 seconds. 5 spot fluoroscopic images are obtained. Spot fluoroscopic images demonstrate a pre-existing right total knee arthroplasty with periprosthetic supracondylar femoral metaphyseal fracture. The long intramedullary rod is placed with multiple distal locking screws and a single proximal locking screw. Fracture line remains visible but alignment appears near-anatomic. IMPRESSION: Intraoperative fluoroscopy obtained for surgical control purposes demonstrating intramedullary rod and screw fixation of a periprosthetic distal femoral metaphyseal fracture. Right knee arthroplasty is present. Electronically Signed   By: Lucienne Capers M.D.   On: 05/23/2016 21:53   Dg Hip Unilat W Or Wo Pelvis 2-3 Views Right  Result Date: 05/23/2016 CLINICAL DATA:  Pain following fall EXAM: DG HIP (WITH OR WITHOUT PELVIS) 2-3V RIGHT COMPARISON:  None. FINDINGS: Frontal pelvis as well as frontal and lateral right hip images were obtained. There is no acute fracture or dislocation. There is advanced arthropathy in the right hip  joint with remodeling of the right acetabulum. There is remodeling of the right femoral head with flattening and sclerosis with subchondral cystic change. 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 There is relatively mild narrowing of the left hip joint. No erosive change on the left. There is lower lumbar levoscoliosis. IMPRESSION: No acute fracture or dislocation. 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 Remodeling of the acetabulum on the right is indicative of the chronic nature of the arthropathy and avascular necrosis changes on the right. There is mild osteoarthritic change in the left hip joint. Electronically Signed   By: Lowella Grip III M.D.   On: 05/23/2016 10:57   Dg Femur, Min 2 Views Right  Result Date: 05/23/2016 CLINICAL DATA:  Intramedullary nail of right femur. Closed right femur fracture. EXAM: RIGHT FEMUR 2 VIEWS; DG C-ARM 61-120 MIN COMPARISON:  CT right knee 05/23/2016. Right knee radiographs 05/23/2016. FINDINGS: Intraoperative fluoroscopy is obtained for surgical control purposes. Fluoroscopy time is recorded at 1 minute 17 seconds. 5 spot fluoroscopic images are obtained. Spot fluoroscopic images demonstrate a pre-existing right total knee arthroplasty with periprosthetic supracondylar femoral metaphyseal fracture. The long intramedullary rod is placed with multiple distal locking screws and a single proximal locking screw. Fracture line remains visible but alignment appears near-anatomic. IMPRESSION: Intraoperative fluoroscopy obtained for surgical control purposes demonstrating intramedullary rod and screw fixation of a periprosthetic distal femoral metaphyseal fracture. Right knee arthroplasty is present. Electronically Signed   By: Lucienne Capers M.D.   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)   HTN (hypertension)   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  Closed fracture of right distal femur ([place])   Displaced supracondylar fracture without intracondylar extension of lower end of right femur, initial encounter for closed fracture (HCC)   Hyperkalemia  Hyperkalemia -Repeat K+ 5.4  -likely related to some mild dehydration in setting of poor oral intake preop and continuing home meds -No acute finding on Ekg, NSR 69bpm -Mg+ and phos pending -IVF hydration and hold home diuretic, ACEI, metformin -She did receive albuterol neb with initial K+ reading -recheck K+ this afternoon  DM2 -holding metformin with above -continue SSI while inpt  Chronic renal insufficiency -baseline appears to be 1.1 and currently 1.12 -monitor   Anemia, chronic normocytic -stable. Monitor  DVT prophylaxis Per ortho  Thank you for this consultation.  Our [facility] hospitalist team will follow the patient with you.     Patrici Ranks, NP-C [facility] [place]  pgr [phone]

## 2016-05-24 NOTE — Progress Notes (Signed)
Subjective: 1 Day Post-Op Procedure(s) (LRB): INTRAMEDULLARY (IM) RETROGRADE FEMORAL NAILING (Right) Patient reports pain as moderate.    Objective: Vital signs in last 24 hours: Temp:  [97.5 F (36.4 C)-98.7 F (37.1 C)] 97.5 F (36.4 C) (03/15 0405) Pulse Rate:  [77-111] 82 (03/15 0405) Resp:  [13-19] 13 (03/15 0405) BP: (102-162)/(62-94) 145/70 (03/15 0405) SpO2:  [93 %-100 %] 95 % (03/15 0405) Weight:  [198 lb (89.8 kg)] 198 lb (89.8 kg) (03/14 1002)  Intake/Output from previous day: 03/14 0701 - 03/15 0700 In: 2025 [I.V.:2025] Out: 550 [Urine:450; Blood:100] Intake/Output this shift: No intake/output data recorded.   Recent Labs  05/23/16 1051 05/24/16 0010  HGB 11.3* 9.7*    Recent Labs  05/23/16 1051 05/24/16 0010  WBC 9.2 14.1*  RBC 3.83* 3.39*  HCT 34.2* 30.5*  PLT 351 310    Recent Labs  05/23/16 1051 05/24/16 0010  NA 135 139  K 4.7 6.7*  CL 101 110  CO2 25 19*  BUN 25* 29*  CREATININE 1.10* 1.44*  GLUCOSE 106* 120*  CALCIUM 9.0 8.5*    Recent Labs  05/23/16 1115  INR 1.02    Neurologically intact Dg Chest 1 View  Result Date: 05/23/2016 CLINICAL DATA:  Preop for knee surgery. EXAM: CHEST 1 VIEW COMPARISON:  08/05/2013 FINDINGS: High-riding humeral heads, consistent with chronic rotator cuff insufficiency. Cervical spine fixation. Midline trachea. Normal heart size. Atherosclerosis in the transverse aorta. No pleural effusion or pneumothorax. Low lung volumes. Bibasilar volume loss. No lobar consolidation. IMPRESSION: Low lung volumes, without acute disease. Aortic atherosclerosis. Electronically Signed   By: Abigail Miyamoto M.D.   On: 05/23/2016 12:12   Ct Knee Right Wo Contrast  Result Date: 05/23/2016 CLINICAL DATA:  Status post fall yesterday with onset of right knee pain. Right knee fracture by plain films this same day. Initial encounter. EXAM: CT OF THE RIGHT KNEE WITHOUT CONTRAST TECHNIQUE: Multidetector CT imaging of the right  knee was performed according to the standard protocol. Multiplanar CT image reconstructions were also generated. COMPARISON:  Plain films right knee this same day. FINDINGS: Bones/Joint/Cartilage Right knee arthroplasty is in place. As seen on the comparison plain films, there is a fracture through the metaphysis of the right femur. Fracture through the lateral cortex is approximately 6.5 cm above the articular surface of the patient's prosthesis. The fracture extends in an oblique and inferior orientation through the medial metaphysis approximately 5 cm above the articular surface. The fracture shows valgus angulation of approximately 21 degrees. Fragment override laterally of 1 cm is identified. Bones are markedly osteopenic. No other fracture is identified. Patient motion causes some artifact in the proximal diaphysis of the tibia on the lateral side. Ligaments Suboptimally assessed by CT. Muscles and Tendons Appear intact. Soft tissues No acute abnormality. IMPRESSION: Periprosthetic fracture distal right femur with approximately 21 degrees of valgus angulation and mild impaction as described above. Marked osteopenia. Electronically Signed   By: Inge Rise M.D.   On: 05/23/2016 12:52   Dg Knee Complete 4 Views Right  Result Date: 05/23/2016 CLINICAL DATA:  Right hip and knee pain due to a fall yesterday. Initial encounter. EXAM: RIGHT KNEE - COMPLETE 4+ VIEW COMPARISON:  None. FINDINGS: The patient has a total knee arthroplasty in place. There is a periprosthetic fracture of the distal femur with mild impaction of approximately 1 cm on the lateral side. No true lateral view is provided but no obvious anterior or posterior displacement is seen. Bones are osteopenic.  IMPRESSION: Acute mildly impacted periprosthetic fracture distal right femur. Osteopenia. Electronically Signed   By: Inge Rise M.D.   On: 05/23/2016 10:55   Dg C-arm 61-120 Min  Result Date: 05/23/2016 CLINICAL DATA:   Intramedullary nail of right femur. Closed right femur fracture. EXAM: RIGHT FEMUR 2 VIEWS; DG C-ARM 61-120 MIN COMPARISON:  CT right knee 05/23/2016. Right knee radiographs 05/23/2016. FINDINGS: Intraoperative fluoroscopy is obtained for surgical control purposes. Fluoroscopy time is recorded at 1 minute 17 seconds. 5 spot fluoroscopic images are obtained. Spot fluoroscopic images demonstrate a pre-existing right total knee arthroplasty with periprosthetic supracondylar femoral metaphyseal fracture. The long intramedullary rod is placed with multiple distal locking screws and a single proximal locking screw. Fracture line remains visible but alignment appears near-anatomic. IMPRESSION: Intraoperative fluoroscopy obtained for surgical control purposes demonstrating intramedullary rod and screw fixation of a periprosthetic distal femoral metaphyseal fracture. Right knee arthroplasty is present. Electronically Signed   By: Lucienne Capers M.D.   On: 05/23/2016 21:53   Dg Hip Unilat W Or Wo Pelvis 2-3 Views Right  Result Date: 05/23/2016 CLINICAL DATA:  Pain following fall EXAM: DG HIP (WITH OR WITHOUT PELVIS) 2-3V RIGHT COMPARISON:  None. FINDINGS: Frontal pelvis as well as frontal and lateral right hip images were obtained. There is no acute fracture or dislocation. There is advanced arthropathy in the right hip joint with remodeling of the right acetabulum. There is remodeling of the right femoral head with flattening and sclerosis with subchondral cystic change. 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 There is relatively mild narrowing of the left hip joint. No erosive change on the left. There is lower lumbar levoscoliosis. IMPRESSION: No acute fracture or dislocation. 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 Remodeling of the acetabulum on the right is indicative of the chronic nature of the  arthropathy and avascular necrosis changes on the right. There is mild osteoarthritic change in the left hip joint. Electronically Signed   By: Lowella Grip III M.D.   On: 05/23/2016 10:57   Dg Femur, Min 2 Views Right  Result Date: 05/23/2016 CLINICAL DATA:  Intramedullary nail of right femur. Closed right femur fracture. EXAM: RIGHT FEMUR 2 VIEWS; DG C-ARM 61-120 MIN COMPARISON:  CT right knee 05/23/2016. Right knee radiographs 05/23/2016. FINDINGS: Intraoperative fluoroscopy is obtained for surgical control purposes. Fluoroscopy time is recorded at 1 minute 17 seconds. 5 spot fluoroscopic images are obtained. Spot fluoroscopic images demonstrate a pre-existing right total knee arthroplasty with periprosthetic supracondylar femoral metaphyseal fracture. The long intramedullary rod is placed with multiple distal locking screws and a single proximal locking screw. Fracture line remains visible but alignment appears near-anatomic. IMPRESSION: Intraoperative fluoroscopy obtained for surgical control purposes demonstrating intramedullary rod and screw fixation of a periprosthetic distal femoral metaphyseal fracture. Right knee arthroplasty is present. Electronically Signed   By: Lucienne Capers M.D.   On: 05/23/2016 21:53    Assessment/Plan: 1 Day Post-Op Procedure(s) (LRB): INTRAMEDULLARY (IM) RETROGRADE FEMORAL NAILING (Right) Plan:   Creatinine increase in Hx of renal problems. Potassium was 6.7, repeat pending. hospitalist consult called to review meds etc.   [name] 05/24/2016, 7:41 AM

## 2016-05-24 NOTE — Evaluation (Signed)
Occupational Therapy Evaluation Patient Details Name: Debbie Mullins MRN: [id number] DOB: [birth year] Today's Date: 05/24/2016    History of Present Illness 61 y.o. female admitted to [facility] on 05/23/16 for weakness, thrombocytosis, SOB, pulmonary nodules, peripherial edema, neck pain, HTN, DM, COPD, CHF, anemia, bil TKA, and ACDF (2016).    Clinical Impression   Pt admitted with the above diagnoses and presents with below problem list. Pt will benefit from continued acute OT to address the below listed deficits and maximize independence with basic BADLs prior to d/c to venue below. PTA pt was mod I with ADLs "I made do." Pt is currently mod to max A +2 for LB ADLs and sit<>stand transfers. Maximove utilized due to only +2 assist available at time of eval and would have needed +3 (third person to hold RLE in NWB position) in order to safely attempt SPT to recliner.        Follow Up Recommendations  SNF    Equipment Recommendations  Other (comment) (defer to next venue)    Recommendations for Other Services       Precautions / Restrictions Precautions Precautions: Fall;Other (comment) Precaution Comments: No knee flexion ROM Required Braces or Orthoses: Knee Immobilizer - Right Knee Immobilizer - Right: On at all times Restrictions Weight Bearing Restrictions: Yes RLE Weight Bearing: Non weight bearing      Mobility Bed Mobility Overal bed mobility: Needs Assistance Bed Mobility: Supine to Sit;Sit to Supine     Supine to sit: +2 for physical assistance;Mod assist;HOB elevated Sit to supine: +2 for physical assistance;Max assist   General bed mobility comments: Two person mod assist to get to sitting EOB with HOB elevated.  Assist provided to help progress right leg to EOB and at pelvis to help scoot to EOB.  Pt able to lift her trunk to semi long sitting unassisted and has limited use of arms to pull on bed rail due to pre morbid UE weakness.   Transfers Overall transfer  level: Needs assistance Equipment used: Rolling walker (2 wheeled) Transfers: Sit to/from Stand Sit to Stand: +2 physical assistance;Mod assist;From elevated surface         General transfer comment: Two person mod assist to stand twice at EOB with one therapist assisting the trunk to power up and second therapist holding right foot in NWB, stabilizing RW and assisting left knee into full extension.  We stood x 2 first time ~30 sec, second time <10 sec.   We would have needed a third person to safely pivot to the chair and pt was starting to fatigue EOB, so we positioned back in supine max assist to support both legs and trunk to return to flat bed and lifted her to the chair with the maxi move total lift.     Balance Overall balance assessment: Needs assistance Sitting-balance support: Feet supported;Bilateral upper extremity supported;No upper extremity supported Sitting balance-Leahy Scale: Fair     Standing balance support: Bilateral upper extremity supported Standing balance-Leahy Scale: Poor Standing balance comment: two person assist, reliance on bed and therapists for stability and use of RW.                             ADL Overall ADL's : Needs assistance/impaired Eating/Feeding: Set up;Sitting Eating/Feeding Details (indicate cue type and reason): able to self-feed ice chips with spoon Grooming: Minimal assistance;Set up;Sitting   Upper Body Bathing: Set up;Minimal assistance;Sitting   Lower Body Bathing: +2  for physical assistance;Moderate assistance;Maximal assistance;Sit to/from stand   Upper Body Dressing : Minimal assistance;Set up;Sitting   Lower Body Dressing: Moderate assistance;Maximal assistance;+2 for physical assistance;Sit to/from stand                 General ADL Comments: Pt completed 2 sit<>stands from EOB.      Vision         Perception     Praxis      Pertinent Vitals/Pain Pain Assessment: 0-10 Pain Score: 6  Pain  Location: right knee/leg Pain Descriptors / Indicators: Aching;Burning Pain Intervention(s): Limited activity within patient's tolerance;Monitored during session;Repositioned     Hand Dominance Left (originally right now left )   Extremity/Trunk Assessment Upper Extremity Assessment Upper Extremity Assessment: RUE deficits/detail;LUE deficits/detail RUE Deficits / Details: minimal AROM at shoulder level. Weakness noted throughout BUE. With effort pt able to position BUE for rw use in sitting. Pt prefering position of shoulder IR. Used to be R handed "til I wore it out." Now using L>R for tasks. Pt reports deficits are due to preexisting issues with her neck.   LUE Deficits / Details: )  minimal AROM at shoulder level. Weakness noted throughout BUE. With effort pt able to position BUE for rw use in sitting. Pt prefering position of shoulder IR. Used to be R handed "til I wore it out." Now using L>R for tasks. Pt reports deficits are due to preexisting issues with her neck   Lower Extremity Assessment Lower Extremity Assessment: Defer to PT evaluation RLE Deficits / Details: right leg with normal post op pain and weakness,  ankle at least 3/5, knee NT due to immobilization and no flexion ROM allowed at this time, hip strength 1/5.   RLE: Unable to fully assess due to immobilization RLE Sensation:  (intact to LT in areas outside of KI)   Cervical / Trunk Assessment Cervical / Trunk Assessment: Other exceptions Cervical / Trunk Exceptions: per pt reoort her arm issues came orignially from her neck.    Communication Communication Communication: No difficulties   Cognition Arousal/Alertness: Awake/alert Behavior During Therapy: WFL for tasks assessed/performed Overall Cognitive Status: Within Functional Limits for tasks assessed                     General Comments  O2 sats stable on 2 L O2 [place] as well as HR, BP not taken as pt did not report any lightheadedness.     Exercises  Exercises: Other exercises Other Exercises Other Exercises: encouraged ankle pumps and IS use, pt demonstrated both to therapists.    Shoulder Instructions      Home Living Family/patient expects to be discharged to:: Private residence Living Arrangements: Other relatives;Other (Comment) (lives with cousin) Available Help at Discharge: Family;Other (Comment) (cousin works during the day) Type of Home: House Home Access: [place]: One level     Bathroom Shower/Tub: Occupational psychologist: [place] Accessibility: Yes How Accessible: Accessible via Time: [place] - 4 wheels;Hand held shower head;Bedside commode          Prior Functioning/Environment Level of Independence: Needs assistance  Gait / Transfers Assistance Needed: rollator at baseline ADL's / [place] Needed: pt reports she "makes do" with ADLs.             OT Problem List: Decreased activity tolerance;Impaired balance (sitting and/or standing);Decreased knowledge of use of DME or AE;Decreased knowledge of precautions  OT Treatment/Interventions: Self-care/ADL training;DME and/or AE instruction;Therapeutic activities;Patient/family education;Balance training    OT Goals(Current goals can be found in the care plan section) Acute Rehab OT Goals Patient Stated Goal: to get back to her prior level of function.  OT Goal Formulation: With patient Time For Goal Achievement: 05/31/16 Potential to Achieve Goals: Good ADL Goals Pt Will Perform Lower Body Bathing: with min assist;sit to/from stand (+2) Pt Will Perform Lower Body Dressing:  (+2) Pt Will Transfer to Toilet: with max assist;with +2 assist;stand pivot transfer;bedside commode Pt Will Perform Toileting - Clothing Manipulation and hygiene: with 2+ total assist;with mod assist;sit to/from stand Additional ADL Goal #1: Pt will complete bed mobility with min A to prepare for OOB ADLs.    OT Frequency: Min 2X/week   Barriers to D/C:            Co-evaluation PT/OT/SLP Co-Evaluation/Treatment: Yes Reason for Co-Treatment: Complexity of the patient's impairments (multi-system involvement);For patient/therapist safety;To address functional/ADL transfers PT goals addressed during session: Mobility/safety with mobility;Balance;Proper use of DME;Strengthening/ROM OT goals addressed during session: ADL's and self-care      End of Session Equipment Utilized During Treatment: Gait belt;Rolling walker;Right knee immobilizer;Oxygen  Activity Tolerance: Patient limited by pain;Patient limited by fatigue Patient left: in chair;with call bell/phone within reach  OT Visit Diagnosis: Unsteadiness on feet (R26.81);History of falling (Z91.81)                ADL either performed or assessed with clinical judgement  Time: 1427-1520 OT Time Calculation (min): 53 min Charges:  OT General Charges $OT Visit: 1 Procedure OT Evaluation $OT Eval Moderate Complexity: 1 Procedure OT Treatments $Self Care/Home Management : 8-22 mins G-Codes:       Hortencia Pilar 05/24/2016, 5:02 PM

## 2016-05-24 NOTE — Progress Notes (Signed)
At [place] lab called with a critical lab value potassium 6.7, on call contacted and a re-peat lab order was given for 0500.  There was on order already at 0500 for BMET and CBC in the computer.

## 2016-05-25 DIAGNOSIS — S72451A Displaced supracondylar fracture without intracondylar extension of lower end of right femur, initial encounter for closed fracture: Principal | ICD-10-CM

## 2016-05-25 DIAGNOSIS — I10 Essential (primary) hypertension: Secondary | ICD-10-CM

## 2016-05-25 DIAGNOSIS — D649 Anemia, unspecified: Secondary | ICD-10-CM

## 2016-05-25 LAB — HEMOGLOBIN A1C
Hgb A1c MFr Bld: 5.8 % — ABNORMAL HIGH (ref 4.8–5.6)
Mean Plasma Glucose: 120 mg/dL

## 2016-05-25 LAB — CBC
HCT: 26.9 % — ABNORMAL LOW (ref 36.0–46.0)
Hemoglobin: 8.7 g/dL — ABNORMAL LOW (ref 12.0–15.0)
MCH: 29.4 pg (ref 26.0–34.0)
MCHC: 32.3 g/dL (ref 30.0–36.0)
MCV: 90.9 fL (ref 78.0–100.0)
Platelets: 274 10*3/uL (ref 150–400)
RBC: 2.96 MIL/uL — ABNORMAL LOW (ref 3.87–5.11)
RDW: 17.4 % — ABNORMAL HIGH (ref 11.5–15.5)
WBC: 10.2 10*3/uL (ref 4.0–10.5)

## 2016-05-25 LAB — BASIC METABOLIC PANEL
Anion gap: 8 (ref 5–15)
BUN: 15 mg/dL (ref 6–20)
CO2: 25 mmol/L (ref 22–32)
Calcium: 8.2 mg/dL — ABNORMAL LOW (ref 8.9–10.3)
Chloride: 102 mmol/L (ref 101–111)
Creatinine, Ser: 0.96 mg/dL (ref 0.44–1.00)
GFR calc Af Amer: >60 mL/min (ref >60)
GFR calc non Af Amer: >60 mL/min (ref >60)
Glucose, Bld: 91 mg/dL (ref 65–99)
Potassium: 5.1 mmol/L (ref 3.5–5.1)
Sodium: 135 mmol/L (ref 135–145)

## 2016-05-25 LAB — GLUCOSE, CAPILLARY
Glucose-Capillary: 111 mg/dL — ABNORMAL HIGH (ref 65–99)
Glucose-Capillary: 132 mg/dL — ABNORMAL HIGH (ref 65–99)
Glucose-Capillary: 81 mg/dL (ref 65–99)
Glucose-Capillary: 175 mg/dL — ABNORMAL HIGH (ref 65–99)

## 2016-05-25 MED ORDER — METHOCARBAMOL 500 MG PO TABS: 500.0000 mg | ORAL_TABLET | Freq: Four times a day (QID) | ORAL | 0 refills | Status: DC | PRN

## 2016-05-25 MED ORDER — ASPIRIN EC 325 MG PO TBEC: 325.0000 mg | DELAYED_RELEASE_TABLET | Freq: Every day | ORAL | 0 refills | Status: DC

## 2016-05-25 MED ORDER — OXYCODONE-ACETAMINOPHEN 5-325 MG PO TABS: 1.0000 | ORAL_TABLET | ORAL | 0 refills | Status: DC | PRN

## 2016-05-25 MED ORDER — MAGNESIUM SULFATE 2 GM/50ML IV SOLN
2.0000 g | Freq: Once | INTRAVENOUS | Status: AC
  Administered 2016-05-25: 2 g via INTRAVENOUS
  Filled 2016-05-25: qty 50

## 2016-05-25 NOTE — Progress Notes (Signed)
Physical Therapy Treatment Patient Details Name: Debbie Mullins MRN: [id number] DOB: 08/03/1955 Today's Date: 05/25/2016    History of Present Illness 61 y.o. female admitted to [facility] on 05/23/16 for weakness, thrombocytosis, SOB, pulmonary nodules, peripherial edema, neck pain, HTN, DM, COPD, CHF, anemia, bil TKA, and ACDF (2016).     PT Comments    Pt is progressing well with her mobility.  She was able to stand longer EOB today, but is still not quite ready for standing transfer to chair.  Maxi move total lift used to get her some OOB time.  O2 sats were not maintained on RA during mobility, so 2 L O2 [place] was re-applied (see details below or in vitals flow sheet).  Pt reports she did not use O2 at home PTA.  PT will continue to follow acutely to progress mobility.   Follow Up Recommendations  SNF;Other (comment) (wants [facility] in [place])     Financial risk analyst (measurements PT);Wheelchair cushion (measurements PT);Hospital bed    Recommendations for Other Services   NA     Precautions / Restrictions Precautions Precautions: Fall;Other (comment) Precaution Comments: No knee flexion ROM Required Braces or Orthoses: Knee Immobilizer - Right Knee Immobilizer - Right: On at all times Restrictions RLE Weight Bearing: Non weight bearing    Mobility  Bed Mobility Overal bed mobility: Needs Assistance Bed Mobility: Supine to Sit;Sit to Supine     Supine to sit: +2 for physical assistance;Mod assist;HOB elevated Sit to supine: +2 for physical assistance;Mod assist   General bed mobility comments: Two person mod assist to help transition trunk and legs into and out of bed.  Pt better able to assist at trunk with retrun to supine today.   Transfers Overall transfer level: Needs assistance Equipment used: Rolling walker (2 wheeled) Transfers: Sit to/from Stand Sit to Stand: +2 physical assistance;Mod assist;From elevated surface         General  transfer comment: Two person mod assist to stand from elevated bed today with one person assisting at trunk and one person maintaining NWB on her right foot.  Pt was able to stand ~1 min twice with less fatigue today compared to yesterday.  Pt and PT still did not feel she was safe enough to stand and turn to recliner without risk of falling, so we positioned back in supine and used maxi move total lift to get OOB to chair.           Balance Overall balance assessment: Needs assistance Sitting-balance support: Feet supported;Bilateral upper extremity supported;Single extremity supported Sitting balance-Leahy Scale: Fair Sitting balance - Comments: posterior preference in sitting   Standing balance support: Bilateral upper extremity supported Standing balance-Leahy Scale: Poor Standing balance comment: two person assist, reliance on bed and therapists for stability and use of RW.                     Cognition Arousal/Alertness: Awake/alert Behavior During Therapy: WFL for tasks assessed/performed Overall Cognitive Status: Within Functional Limits for tasks assessed                      Exercises Total Joint Exercises Ankle Circles/Pumps: AROM;Both;20 reps Quad Sets: AROM;Both;10 reps Towel Squeeze: AROM;Both;10 reps Hip ABduction/ADduction: AAROM;Right;10 reps Straight Leg Raises: AAROM;Right;10 reps Other Exercises Other Exercises: Pt reports she has also been doing glute squeezes on her own.     General Comments General comments (skin integrity, edema, etc.): O2 sats decreased to low 80s  on RA during bed mobility.  2 L O2 [place] returned to nose with good results (O2 sats in mid 90s with 2 L O2 [place] returned to nose rest of session).  BP stable sitting EOB and no reports of lightheadedness.       Pertinent Vitals/Pain Pain Assessment: 0-10 Pain Score: 6  Pain Location: right knee/leg Pain Descriptors / Indicators: Aching;Burning Pain Intervention(s): Limited  activity within patient's tolerance;Monitored during session;Repositioned;Premedicated before session;Ice applied           PT Goals (current goals can now be found in the care plan section) Acute Rehab PT Goals Patient Stated Goal: to get back to her prior level of function.  Progress towards PT goals: Progressing toward goals    Frequency    Min 3X/week      PT Plan Current plan remains appropriate       End of Session Equipment Utilized During Treatment: Gait belt;Right knee immobilizer;Oxygen Activity Tolerance: Patient limited by pain;Patient limited by fatigue Patient left: in chair;with call bell/phone within reach   PT Visit Diagnosis: History of falling (Z91.81);Muscle weakness (generalized) (M62.81);Difficulty in walking, not elsewhere classified (R26.2);Pain Pain - Right/Left: Right Pain - part of body: Leg     Time: 5462-7035 PT Time Calculation (min) (ACUTE ONLY): 54 min  Charges:  $Therapeutic Exercise: 8-22 mins $Therapeutic Activity: 38-52 mins                       Debbie Mullins, [place], DPT [phone]   05/25/2016, 11:23 AM

## 2016-05-25 NOTE — Care Management Note (Signed)
Case Management Note  Patient Details  Name: Debbie Mullins MRN: [id number] Date of Birth: 1955/09/27  Subjective/Objective:     Closed fracture of right distal femur               Action/Plan: Discharge Planning: Chart reviewed. CSW following for SNF placement. Will continue to follow for dc needs.   PCP Lucianne Lei MD Expected Discharge Date:                Expected Discharge Plan:  [name]  In-House Referral:  Clinical Social Work  Discharge planning Services  CM Consult  Post Acute Care Choice:  NA Choice offered to:  NA  DME Arranged:  N/A DME Agency:  NA  HH Arranged:  NA HH Agency:  NA  Status of Service:  Completed, signed off  If discussed at [place] of Stay Meetings, dates discussed:    Additional Comments:  Erenest Rasher, RN 05/25/2016, 9:11 AM

## 2016-05-25 NOTE — Progress Notes (Signed)
Initial Nutrition Assessment  DOCUMENTATION CODES:   Obesity unspecified  INTERVENTION:   -Glucerna Shake po BID, each supplement provides 220 kcal and 10 grams of protein -D/c Ensure Enlive po BID, each supplement provides 350 kcal and 20 grams of protein  NUTRITION DIAGNOSIS:   Increased nutrient needs related to  (post-op healing) as evidenced by estimated needs.  GOAL:   Patient will meet greater than or equal to 90% of their needs  MONITOR:   PO intake, Supplement acceptance, Labs, Weight trends, Skin, I & O's  REASON FOR ASSESSMENT:   Malnutrition Screening Tool    ASSESSMENT:   Debbie Mullins is an 61 y.o. female with pmg of DM2, htn, COPD, CKD who presented to the ED on 05/23/16 after fall at home on day prior. Pt states her knee 'gave out'. She denies any syncopal event, no LOC, no recent chest pain, sob, abdominal pain, n/v/d. CT in ED revealed right femoreal fracture s/p repair on 05/23/16. Post op labs this am showed K+ 6.7 prompting consult to triad hospitalists.   s/p Procedure(s) 05/23/16: INTRAMEDULLARY (IM) NAIL FEMORAL (Right)  Pt sleeping soundly at time of visit.   Reviewed wt hx, noted slow, progressive decline in wt over the past 4 years. No significant wt changes over the past year and a half. Slow, progressive wt loss is desirable given obesity.   Per meal completion records, pt consuming 60% of meals. RD will order Glucerna supplements to help pt meet nutritional needs.   Nutrition-Focused physical exam completed. Findings are no fat depletion, no muscle depletion, and no edema.   Per MD notes, plan to d/c to SNF today.   Labs reviewed: CBGS: 104-139.  Diet Order:  Diet Carb Modified Fluid consistency: Thin; Room service appropriate? Yes  Skin:  Reviewed, no issues  Last BM:  05/21/16  Height:   Ht Readings from Last 1 Encounters:  05/23/16 5\' 3"  (1.6 m)    Weight:   Wt Readings from Last 1 Encounters:  05/23/16 198 lb (89.8 kg)     Ideal Body Weight:  52.3 kg  BMI:  Body mass index is 35.07 kg/m.  Estimated Nutritional Needs:   Kcal:  1800-2000  Protein:  85-100 grams  Fluid:  1.8-2.0 L  EDUCATION NEEDS:   No education needs identified at this time  Zev Blue A. Jimmye Norman, RD, LDN, CDE Pager: [phone] After hours Pager: [phone]

## 2016-05-25 NOTE — NC FL2 (Signed)
[place] LEVEL OF CARE SCREENING TOOL     IDENTIFICATION  Patient Name: Debbie Mullins Birthdate: 1955/04/28 Sex: female Admission Date (Current Location): 05/23/2016  [facility] and [place] Number:  Herbalist and Address:  [facility], [place] [address], [place], [place] [address]      Provider Number: [id number]  Attending Physician Name and Address:  [name], MD  Relative Name and Phone Number:       Current Level of Care: Hospital Recommended Level of Care: [place] Prior Approval Number:    Date Approved/Denied:   PASRR Number: [id number]  Discharge Plan: SNF    Current Diagnoses: Patient Active Problem List   Diagnosis Date Noted  . Hyperkalemia 05/24/2016  . Displaced supracondylar fracture without intracondylar extension of lower end of right femur, initial encounter for closed fracture ([place]) 05/23/2016  . 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 Closed fracture of right distal femur ([place])   . Osteoarthritis of spine with myelopathy, cervical region 11/17/2014  . 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 Pulmonary nodules/lesions, multiple 08/09/2013  . HLD (hyperlipidemia) 01/22/2012  . Pulmonary hypertension 01/08/2012  . Acute on chronic diastolic heart failure ([place]) 01/05/2012  . Acute respiratory failure with hypoxia ([place]) 01/03/2012  . Pulmonary edema, acute ([place]) 01/03/2012  . 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 HTN (hypertension) 01/02/2012  . COPD with acute exacerbation ([place]) 01/02/2012  . Tobacco abuse 07/04/2011  . 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 Leukocytosis 02/09/2011  . Normocytic anemia 02/09/2011  . Mild Thrombocytosis 02/09/2011  . COPD (chronic obstructive pulmonary disease) ([place]) 02/09/2011  . Obesity 02/09/2011    Orientation RESPIRATION BLADDER Height & Weight     Self, Situation, Place, Time  O2  (Nasal Cannula, 2L) Continent Weight: 198 lb (89.8 kg) Height:  5\' 3"  (160 cm)  BEHAVIORAL SYMPTOMS/MOOD NEUROLOGICAL BOWEL NUTRITION STATUS      Continent  (Please see d/c summary)  AMBULATORY STATUS COMMUNICATION OF NEEDS Skin   Extensive Assist Verbally Surgical wounds (Closed incision right knee, Hydrocolloid dressing. Closed incision left knee)                       Personal Care Assistance Level of Assistance  Bathing, Feeding, Dressing Bathing Assistance: Maximum assistance Feeding assistance: Limited assistance Dressing Assistance: Maximum assistance     Functional Limitations Info  Sight, Hearing, Speech Sight Info: Adequate Hearing Info: Adequate Speech Info: Adequate    SPECIAL CARE FACTORS FREQUENCY  PT (By licensed PT), OT (By licensed OT)     PT Frequency: min 3x week OT Frequency: min 3x week            Contractures Contractures Info: Not present    Additional Factors Info  Code Status, Allergies Code Status Info: Full Code Allergies Info: No known allergies           Current Medications (05/25/2016):  This is the current hospital active medication list Current Facility-Administered Medications  Medication Dose Route Frequency Provider Last Rate Last Dose  . 0.9 %  sodium chloride infusion   Intravenous Continuous Dionne Milo, NP 125 mL/hr at 05/24/16 1245    . albuterol (PROVENTIL) (2.5 MG/3ML) 0.083% nebulizer solution 3 mL  3 mL Inhalation Q6H PRN [name], MD      . amLODipine (NORVASC) tablet 5 mg  5 mg Oral Daily  [name], MD   5 mg at 05/25/16 1660  . aspirin EC tablet 325 mg  325 mg Oral Q breakfast [name], MD   325 mg at 05/25/16 6301  . buPROPion (WELLBUTRIN XL) 24 hr tablet 150 mg  150 mg Oral Daily [name], MD   150 mg at 05/25/16 6010  . diphenhydrAMINE (BENADRYL) 12.5 MG/5ML elixir 12.5 mg  12.5 mg Oral Q6H PRN Mcarthur Rossetti, MD   12.5 mg at 05/25/16 0825  . docusate sodium (COLACE) capsule 100 mg   100 mg Oral BID [name], MD   100 mg at 05/25/16 9323  . feeding supplement (ENSURE ENLIVE) (ENSURE ENLIVE) liquid 237 mL  237 mL Oral BID BM [name], MD   237 mL at 05/25/16 0949  . insulin aspart (novoLOG) injection 0-15 Units  0-15 Units Subcutaneous TID WC [name], MD   2 Units at 05/24/16 1801  . lisinopril (PRINIVIL,ZESTRIL) tablet 10 mg  10 mg Oral Daily [name], MD   10 mg at 05/24/16 1003  . magnesium sulfate IVPB 2 g 50 mL  2 g Intravenous Once Donne Hazel, MD   2 g at 05/25/16 0943  . menthol-cetylpyridinium (CEPACOL) lozenge 3 mg  1 lozenge Oral PRN [name], MD       Or  . phenol (CHLORASEPTIC) mouth spray 1 spray  1 spray Mouth/Throat PRN [name], MD      . methocarbamol (ROBAXIN) tablet 500 mg  500 mg Oral Q6H PRN [name], MD   500 mg at 05/25/16 5573  . metoCLOPramide (REGLAN) tablet 5-10 mg  5-10 mg Oral Q8H PRN [name], MD       Or  . metoCLOPramide (REGLAN) injection 5-10 mg  5-10 mg Intravenous Q8H PRN [name], MD      . montelukast (SINGULAIR) tablet 10 mg  10 mg Oral QHS [name], MD   10 mg at 05/24/16 2307  . morphine 2 MG/ML injection 2 mg  2 mg Intravenous Q2H PRN [name], MD      . ondansetron [facility]) tablet 4 mg  4 mg Oral Q6H PRN [name], MD       Or  . ondansetron [facility]) injection 4 mg  4 mg Intravenous Q6H PRN [name], MD      . oxyCODONE-acetaminophen (PERCOCET/ROXICET) 5-325 MG per tablet 1-2 tablet  1-2 tablet Oral Q4H PRN [name], MD   2 tablet at 05/25/16 [phone]  . pantoprazole (PROTONIX) EC tablet 80 mg  80 mg Oral Q1200 [name], MD   80 mg at 05/24/16 1136  . senna-docusate (Senokot-S) tablet 1 tablet  1 tablet Oral QHS PRN [name], MD   1 tablet at 05/25/16 [phone]  . sodium chloride 0.9 % bolus 500 mL  500 mL Intravenous Once Elwin Mocha, MD      . sodium phosphate (FLEET) 7-19 GM/118ML enema 1 enema  1 enema Rectal Once PRN [name], MD         Discharge  Medications: Please see discharge summary for a list of discharge medications.  Relevant Imaging Results:  Relevant Lab Results:   Additional Information SSN: [id number]  Eileen Stanford, LCSW

## 2016-05-25 NOTE — Clinical Social Work Note (Signed)
Clinical Social Work Assessment  Patient Details  Name: DESHAY KIRSTEIN MRN: [id number] Date of Birth: [birth year]  Date of referral:  05/25/16               Reason for consult:  Facility Placement                Permission sought to share information with:    Permission granted to share information::     Name::        Agency::     Relationship::     Contact Information:     Housing/Transportation Living arrangements for the past 2 months:  Single Family Home Source of Information:  Patient Patient Interpreter Needed:  None Criminal Activity/Legal Involvement Pertinent to Current Situation/Hospitalization:  No - Comment as needed Significant Relationships:  Other Family Members Lives with:  Other (Comment) (Cousin) Do you feel safe going back to the place where you live?  Yes Need for family participation in patient care:  Yes (Comment)  Care giving concerns:  No family or friends present at bedside during initial assessment.     Social Worker assessment / plan:  CSW spoke with pt at bedside to complete initial assessment. Pt lives home with her cousin. Pt verbalized agreement to SNF placement at d/c. Pt wants to go to [facility] in [place]. Pt is from [place]. CSW will send referral and follow up with facility. Pt will need PTAR at d/c.  Employment status:    Insurance informationEducational psychologist PT Recommendations:  [place] / Referral to community resources:  [place]  Patient/Family's Response to care:  Pt verbalized understanding of CSW role and expressed appreciation for support. Pt denies any concern regarding pt care at this time.   Patient/Family's Understanding of and Emotional Response to Diagnosis, Current Treatment, and Prognosis:  Pt understanding and realistic regarding physical limitations. Pt understands the need for SNF placement at d/c. Pt agreeable to SNF placement at d/c, at this time. Pt's responses  emotionally appropriate during conversation with CSW. Pt denies any concern regarding treatment plan at this time. CSW will continue to provide support and facilitate d/c needs.   Emotional Assessment Appearance:  Appears stated age Attitude/Demeanor/Rapport:   (Patient was appropriate.) Affect (typically observed):  Accepting, Appropriate, Calm, Pleasant Orientation:  Oriented to Self, Oriented to Place, Oriented to Situation, Oriented to  Time Alcohol / Substance use:  Not Applicable Psych involvement (Current and /or in the community):  No (Comment)  Discharge Needs  Concerns to be addressed:  No discharge needs identified Readmission within the last 30 days:  No Current discharge risk:  Dependent with Mobility Barriers to Discharge:  Continued Medical Work up   [employer], LCSW 05/25/2016, 9:46 AM

## 2016-05-25 NOTE — Progress Notes (Signed)
PROGRESS NOTE    Debbie Mullins  RJJ:[id number] DOB: June 10, 1955 DOA: 05/23/2016 PCP: Elyn Peers, MD    Brief Narrative:  61 y.o. female with pmg of DM2, htn, COPD, CKD who presented to the ED on 05/23/16 after fall at home on day prior. Pt states her knee 'gave out'. She denies any syncopal event, no LOC, no recent chest pain, sob, abdominal pain, n/v/d. CT in ED revealed right femoreal fracture s/p repair on 05/23/16. Post op labs this am showed K+ 6.7 prompting consult to [facility].   Upon talking to pt, she states that after fall at home, she sat in recliner and little to eat or drink prior to transfer to ED. She did continue taking medications. Again, she denies any recent chest pain, sob or palpitations.   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 Will correct -IVF hydration and cont to hold home diuretic, ACEI, metformin -repeat bmet in AM  Hypomagnesemia -Corrected -Repeat level in AM  DM2 -holding metformin per above -will continue SSI while inpt  Chronic renal insufficiency -baseline appears to be 1.1 -today Cr noted to be 0.96 -Repeat bmet in AM  Anemia, chronic normocytic -stable. Will cont to monitor  DVT prophylaxis Per ortho  DVT prophylaxis: SCD's Code Status: Full Family Communication: Pt in room Disposition Plan:   Consultants:      Procedures:     Antimicrobials: Anti-infectives    Start     Dose/Rate Route Frequency Ordered Stop   05/24/16 0600  ceFAZolin (ANCEF) IVPB 2g/100 mL premix     2 g 200 mL/hr over 30 Minutes Intravenous On call to O.R. 05/23/16 1646 05/23/16 1926   05/23/16 1652  ceFAZolin (ANCEF) 2-4 GM/100ML-% IVPB    Comments:  Schonewitz, Leigh   : cabinet override      05/23/16 1652 05/23/16 1926       Subjective: No complaints  Objective: Vitals:   05/25/16 0500 05/25/16 1020 05/25/16 1108 05/25/16 1459  BP: (!) 119/51 122/74  108/68  Pulse: (!) 107 94 85 81  Resp: 15   16  Temp: 99.5 F (37.5 C)   98.5 F (36.9 C)  TempSrc: Oral     SpO2: 91% (!) 83% 96% 100%  Weight:      Height:       No intake or output data in the 24 hours ending 05/25/16 1633 Filed Weights   05/23/16 1002  Weight: 89.8 kg (198 lb)    Examination:  General exam: Appears calm and comfortable  Respiratory system: Clear to auscultation. Respiratory effort normal. Cardiovascular system: S1 & S2 heard, RRR. Gastrointestinal system: Abdomen is nondistended, soft and nontender. No organomegaly or masses felt. Normal bowel sounds heard. Central nervous system: Alert and oriented. No focal neurological deficits. Extremities: Symmetric 5 x 5 power. Skin: No rashes, lesions Psychiatry: Judgement and insight appear normal. Mood & affect appropriate.   Data  Reviewed: I have personally reviewed following labs and imaging studies  CBC:  Recent Labs Lab 05/23/16 1051 05/24/16 0010 05/24/16 0808 05/25/16 0404  WBC 9.2 14.1* 8.3 10.2  NEUTROABS 5.5  --   --   --   HGB 11.3* 9.7* 9.3* 8.7*  HCT 34.2* 30.5* 28.9* 26.9*  MCV 89.3 90.0 89.2 90.9  PLT 351 310 269 381   Basic Metabolic Panel:  Recent Labs Lab 05/23/16 1051 05/24/16 0010 05/24/16 0808 05/24/16 1008 05/24/16 1545 05/25/16 0404  NA 135 139 135  --   --  135  K 4.7 6.7* 5.4*  --  5.1 5.1  CL 101 110 104  --   --  102  CO2 25 19*  22  --   --  25  GLUCOSE 106* 120* 110*  --   --  91  BUN 25* 29* 20  --   --  15  CREATININE 1.10* 1.44* 1.12*  --   --  0.96  CALCIUM 9.0 8.5* 8.1*  --   --  8.2*  MG  --   --   --  1.5*  --   --   PHOS  --   --   --  4.5  --   --    GFR: Estimated Creatinine Clearance: 68 mL/min (by C-G formula based on SCr of 0.96 mg/dL). Liver Function Tests: No results for input(s): AST, ALT, ALKPHOS, BILITOT, PROT, ALBUMIN in the last 168 hours. No results for input(s): LIPASE, AMYLASE in the last 168 hours. No results for input(s): AMMONIA in the last 168 hours. Coagulation Profile:  Recent Labs Lab 05/23/16 1115  INR 1.02   Cardiac Enzymes:  Recent Labs Lab 05/23/16 1051  CKTOTAL 114   BNP (last 3 results) No results for input(s): PROBNP in the last 8760 hours. HbA1C:  Recent Labs  05/24/16 0010  HGBA1C 5.8*   CBG:  Recent Labs Lab 05/24/16 1204 05/24/16 1646 05/24/16 2148 05/25/16 0735 05/25/16 1212  GLUCAP 145* 139* 104* 111* 132*   Lipid Profile: No results for input(s): CHOL, HDL, LDLCALC, TRIG, CHOLHDL, LDLDIRECT in the last 72 hours. Thyroid Function Tests: No results for input(s): TSH, T4TOTAL, FREET4, T3FREE, THYROIDAB in the last 72 hours. Anemia Panel: No results for input(s): VITAMINB12, FOLATE, FERRITIN, TIBC, IRON, RETICCTPCT in the last 72 hours. Sepsis Labs: No results for input(s): PROCALCITON, LATICACIDVEN in the last 168 hours.  Recent Results (from the past 240 hour(s))  Surgical pcr screen     Status: None   Collection Time: 05/23/16  5:37 PM  Result Value Ref Range Status   MRSA, PCR NEGATIVE NEGATIVE Final   Staphylococcus aureus NEGATIVE NEGATIVE Final    Comment:        The Xpert SA Assay (FDA approved for NASAL specimens in patients over 58 years of age), is one component of a comprehensive surveillance program.  Test performance has been validated by [facility] for patients greater than or equal to 16 year old. It is not  intended to diagnose infection nor to guide or monitor treatment.      Radiology Studies: Dg C-arm 61-120 Min  Result Date: 05/23/2016 CLINICAL DATA:  Intramedullary nail of right femur. Closed right femur fracture. EXAM: RIGHT FEMUR 2 VIEWS; DG C-ARM 61-120 MIN COMPARISON:  CT right knee 05/23/2016. Right knee radiographs 05/23/2016. FINDINGS: Intraoperative fluoroscopy is obtained for surgical control purposes. Fluoroscopy time is recorded at 1 minute 17 seconds. 5 spot fluoroscopic images are obtained. Spot fluoroscopic  images demonstrate a pre-existing right total knee arthroplasty with periprosthetic supracondylar femoral metaphyseal fracture. The long intramedullary rod is placed with multiple distal locking screws and a single proximal locking screw. Fracture line remains visible but alignment appears near-anatomic. IMPRESSION: Intraoperative fluoroscopy obtained for surgical control purposes demonstrating intramedullary rod and screw fixation of a periprosthetic distal femoral metaphyseal fracture. Right knee arthroplasty is present. Electronically Signed   By: Lucienne Capers M.D.   On: 05/23/2016 21:53   Dg Femur, Min 2 Views Right  Result Date: 05/23/2016 CLINICAL DATA:  Intramedullary nail of right femur. Closed right femur fracture. EXAM: RIGHT FEMUR 2 VIEWS; DG C-ARM 61-120 MIN COMPARISON:  CT right knee 05/23/2016. Right knee radiographs 05/23/2016. FINDINGS: Intraoperative fluoroscopy is obtained for surgical control purposes. Fluoroscopy time is recorded at 1 minute 17 seconds. 5 spot fluoroscopic images are obtained. Spot fluoroscopic images demonstrate a pre-existing right total knee arthroplasty with periprosthetic supracondylar femoral metaphyseal fracture. The long intramedullary rod is placed with multiple distal locking screws and a single proximal locking screw. Fracture line remains visible but alignment appears near-anatomic. IMPRESSION: Intraoperative fluoroscopy obtained  for surgical control purposes demonstrating intramedullary rod and screw fixation of a periprosthetic distal femoral metaphyseal fracture. Right knee arthroplasty is present. Electronically Signed   By: Lucienne Capers M.D.   On: 05/23/2016 21:53    Scheduled Meds: . amLODipine  5 mg Oral Daily  . aspirin EC  325 mg Oral Q breakfast  . buPROPion  150 mg Oral Daily  . docusate sodium  100 mg Oral BID  . feeding supplement (ENSURE ENLIVE)  237 mL Oral BID BM  . insulin aspart  0-15 Units Subcutaneous TID WC  . lisinopril  10 mg Oral Daily  . montelukast  10 mg Oral QHS  . pantoprazole  80 mg Oral Q1200  . sodium chloride  500 mL Intravenous Once   Continuous Infusions:   LOS: 2 days   Constantina Laseter, Orpah Melter, MD [facility] Pager [phone]  If 7PM-7AM, please contact night-coverage [email or web address] Password [facility] 05/25/2016, 4:33 PM

## 2016-05-25 NOTE — Discharge Summary (Addendum)
Patient ID: GENASIS ZINGALE MRN: [id number] DOB/AGE: [birth year] 59 y.o.  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   Closed fracture of right distal femur (HCC)   Displaced supracondylar fracture without intracondylar extension of lower end of right femur, initial encounter for closed fracture ([place])   Hyperkalemia   Discharge Diagnoses:  Active Problems:   Normocytic anemia   COPD (chronic obstructive pulmonary disease) (HCC)   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 Anemia   . Asthma   . CHF (congestive heart failure) (HCC)    takes Furosemide daily   . COPD (chronic obstructive pulmonary disease) (HCC)    Albuterol inhaler prn;SIngulair at night  . Depression    takes Wellbutrin daily  . Diabetes mellitus    takes Metformin daily  . GERD (gastroesophageal reflux disease)    takes Nexium daily  . History of colon polyps    benign  . Hypertension    takes Lisinopril daily  . Joint pain   . Joint swelling   . Leukocytosis 02/09/2011  . Neck pain    HNP  . Normocytic anemia 02/09/2011  . Peripheral edema    takes Furosemide daily  . Pneumonia    many yrs ago  . Pulmonary nodules/lesions, multiple 08/09/2013  . Shortness of breath dyspnea    with exertion  . Thrombocytosis ([place]) 02/09/2011  . Urinary frequency    . Urinary urgency   . Weakness    numbness and tingling in both hands    Surgeries: Procedure(s): INTRAMEDULLARY (IM) RETROGRADE FEMORAL NAILING on 05/23/2016   Consultants: Treatment Team:  Elwin Mocha, MD Minerva Ends, MD  Discharged Condition: Improved  Hospital Course: Debbie Mullins is an 61 y.o. female who was admitted 05/23/2016 for operative treatment of periprosthetic femur fracture. Patient failed conservative treatments (please see the history and physical for the specifics) and had severe unremitting pain that affects sleep, daily activities and work/hobbies. After pre-op clearance, the patient was taken to the operating room on 05/23/2016 and underwent  Procedure(s): INTRAMEDULLARY (IM) RETROGRADE FEMORAL NAILING.    Patient was given perioperative antibiotics: Anti-infectives    Start     Dose/Rate Route Frequency Ordered Stop   05/24/16 0600  ceFAZolin (ANCEF) IVPB 2g/100 mL premix     2 g 200 mL/hr over 30 Minutes Intravenous On call to O.R. 05/23/16 1646 05/23/16 1926   05/23/16 1652  ceFAZolin (ANCEF) 2-4 GM/100ML-% IVPB    Comments:  Schonewitz, Leigh   : cabinet override      05/23/16 1652 05/23/16 1926       Patient was given sequential compression devices and early ambulation to prevent DVT.   Patient benefited maximally from hospital stay and there were no complications. At the time of discharge, the patient was urinating/moving their bowels without difficulty, tolerating a regular diet, pain is controlled with oral pain  medications and they have been cleared by PT/OT.   Recent vital signs: Patient Vitals for the past 24 hrs:  BP Temp Temp src Pulse Resp SpO2  05/25/16 1108 - - - 85 - 96 %  05/25/16 1020 122/74 - - 94 - (!) 83 %  05/25/16 0500 (!) 119/51 99.5 F (37.5 C) Oral (!) 107 15 91 %  05/24/16 2020 137/72 98.2 F (36.8 C) Oral 95 16 94 %  05/24/16 1530 128/66 97.6 F (36.4 C) Oral 80 15 94 %  05/24/16 1429 - - - 84 - 92 %     Recent  laboratory studies:  Recent Labs  05/23/16 1115  05/24/16 0808 05/24/16 1545 05/25/16 0404  WBC  --   < > 8.3  --  10.2  HGB  --   < > 9.3*  --  8.7*  HCT  --   < > 28.9*  --  26.9*  PLT  --   < > 269  --  274  NA  --   < > 135  --  135  K  --   < > 5.4* 5.1 5.1  CL  --   < > 104  --  102  CO2  --   < > 22  --  25  BUN  --   < > 20  --  15  CREATININE  --   < > 1.12*  --  0.96  GLUCOSE  --   < > 110*  --  91  INR 1.02  --   --   --   --   CALCIUM  --   < > 8.1*  --  8.2*  < > = values in this interval not displayed.   Discharge Medications:   Allergies as of 05/25/2016   No Known Allergies     Medication List    STOP taking these medications   acetaminophen 500 MG tablet Commonly known as:  TYLENOL   celecoxib 200 MG capsule Commonly known as:  CELEBREX   dexamethasone 4 MG tablet Commonly known as:  DECADRON   HYDROcodone-acetaminophen 10-325 MG tablet Commonly known as:  NORCO   HYDROcodone-acetaminophen 5-325 MG tablet Commonly known as:  NORCO/VICODIN     TAKE these medications   albuterol 108 (90 Base) MCG/ACT inhaler Commonly known as:  PROVENTIL HFA;VENTOLIN HFA Inhale 2 puffs into the lungs every 6 (six) hours as needed. For shortness of breath or wheezing   amLODipine 5 MG tablet Commonly known as:  NORVASC Take 5 mg by mouth daily.   aspirin EC 325 MG tablet Take 1 tablet (325 mg total) by mouth daily. What changed:  Another medication with the same name was removed. Continue taking this medication, and follow the directions you see here.   buPROPion 150 MG 24 hr tablet Commonly known as:  WELLBUTRIN XL Take 150 mg by mouth daily.   DSS 100 MG Caps Take 100 mg by mouth 2 (two) times daily.   esomeprazole 40 MG capsule Commonly known as:  NEXIUM Take 40 mg by mouth daily before breakfast.   etodolac 400 MG tablet Commonly known as:  LODINE Take 400 mg by mouth 2 (two) times daily. Reported on 04/15/2015   furosemide 20 MG  tablet Commonly known as:  LASIX Take 1 tablet (20 mg total) by mouth every other day.   lisinopril 10 MG tablet Commonly known as:  PRINIVIL,ZESTRIL Take 10 mg by mouth daily.   metFORMIN 500 MG  tablet Commonly known as:  GLUCOPHAGE Take 500 mg by mouth 3 (three) times daily.   methocarbamol 500 MG tablet Commonly known as:  ROBAXIN Take 1 tablet (500 mg total) by mouth every 6 (six) hours as needed for muscle spasms (spasm). What changed:  Another medication with the same name was removed. Continue taking this medication, and follow the directions you see here.   montelukast 10 MG tablet Commonly known as:  SINGULAIR Take 10 mg by mouth at bedtime.   nicotine 21 mg/24hr patch Commonly known as:  NICODERM CQ - dosed in mg/24 hours Place 1 patch onto the skin daily. Uses occasionally   oxyCODONE-acetaminophen 5-325 MG tablet Commonly known as:  PERCOCET/ROXICET Take 1-2 tablets by mouth every 4 (four) hours as needed for moderate pain. What changed:  reasons to take this   senna-docusate 8.6-50 MG tablet Commonly known as:  Senokot-S Take 1 tablet by mouth at bedtime as needed for mild constipation.       Diagnostic Studies: Dg Chest 1 View  Result Date: 05/23/2016 CLINICAL DATA:  Preop for knee surgery. EXAM: CHEST 1 VIEW COMPARISON:  08/05/2013 FINDINGS: High-riding humeral heads, consistent with chronic rotator cuff insufficiency. Cervical spine fixation. Midline trachea. Normal heart size. Atherosclerosis in the transverse aorta. No pleural effusion or pneumothorax. Low lung volumes. Bibasilar volume loss. No lobar consolidation. IMPRESSION: Low lung volumes, without acute disease. Aortic atherosclerosis. Electronically Signed   By: Abigail Miyamoto M.D.   On: 05/23/2016 12:12   Ct Knee Right Wo Contrast  Result Date: 05/23/2016 CLINICAL DATA:  Status post fall yesterday with onset of right knee pain. Right knee fracture by plain films this same day. Initial encounter.  EXAM: CT OF THE RIGHT KNEE WITHOUT CONTRAST TECHNIQUE: Multidetector CT imaging of the right knee was performed according to the standard protocol. Multiplanar CT image reconstructions were also generated. COMPARISON:  Plain films right knee this same day. FINDINGS: Bones/Joint/Cartilage Right knee arthroplasty is in place. As seen on the comparison plain films, there is a fracture through the metaphysis of the right femur. Fracture through the lateral cortex is approximately 6.5 cm above the articular surface of the patient's prosthesis. The fracture extends in an oblique and inferior orientation through the medial metaphysis approximately 5 cm above the articular surface. The fracture shows valgus angulation of approximately 21 degrees. Fragment override laterally of 1 cm is identified. Bones are markedly osteopenic. No other fracture is identified. Patient motion causes some artifact in the proximal diaphysis of the tibia on the lateral side. Ligaments Suboptimally assessed by CT. Muscles and Tendons Appear intact. Soft tissues No acute abnormality. IMPRESSION: Periprosthetic fracture distal right femur with approximately 21 degrees of valgus angulation and mild impaction as described above. Marked osteopenia. Electronically Signed   By: Inge Rise M.D.   On: 05/23/2016 12:52   Dg Knee Complete 4 Views Right  Result Date: 05/23/2016 CLINICAL DATA:  Right hip and knee pain due to a fall yesterday. Initial encounter. EXAM: RIGHT KNEE - COMPLETE 4+ VIEW COMPARISON:  None. FINDINGS: The patient has a total knee arthroplasty in place. There is a periprosthetic fracture of the distal femur with mild impaction of approximately 1 cm on the lateral side. No true lateral view is provided but no obvious anterior or posterior displacement is seen. Bones are osteopenic. IMPRESSION: Acute mildly impacted periprosthetic fracture distal right femur. Osteopenia. Electronically Signed   By: Inge Rise M.D.   On:  05/23/2016 10:55   Dg C-arm 61-120  Min  Result Date: 05/23/2016 CLINICAL DATA:  Intramedullary nail of right femur. Closed right femur fracture. EXAM: RIGHT FEMUR 2 VIEWS; DG C-ARM 61-120 MIN COMPARISON:  CT right knee 05/23/2016. Right knee radiographs 05/23/2016. FINDINGS: Intraoperative fluoroscopy is obtained for surgical control purposes. Fluoroscopy time is recorded at 1 minute 17 seconds. 5 spot fluoroscopic images are obtained. Spot fluoroscopic images demonstrate a pre-existing right total knee arthroplasty with periprosthetic supracondylar femoral metaphyseal fracture. The long intramedullary rod is placed with multiple distal locking screws and a single proximal locking screw. Fracture line remains visible but alignment appears near-anatomic. IMPRESSION: Intraoperative fluoroscopy obtained for surgical control purposes demonstrating intramedullary rod and screw fixation of a periprosthetic distal femoral metaphyseal fracture. Right knee arthroplasty is present. Electronically Signed   By: Lucienne Capers M.D.   On: 05/23/2016 21:53   Dg Hip Unilat W Or Wo Pelvis 2-3 Views Right  Result Date: 05/23/2016 CLINICAL DATA:  Pain following fall EXAM: DG HIP (WITH OR WITHOUT PELVIS) 2-3V RIGHT COMPARISON:  None. FINDINGS: Frontal pelvis as well as frontal and lateral right hip images were obtained. There is no acute fracture or dislocation. There is advanced arthropathy in the right hip joint with remodeling of the right acetabulum. There is remodeling of the right femoral head with flattening and sclerosis with subchondral cystic change. 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 There is relatively mild narrowing of the left hip joint. No erosive change on the left. There is lower lumbar levoscoliosis. IMPRESSION: No acute fracture or dislocation. 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  Remodeling of the acetabulum on the right is indicative of the chronic nature of the arthropathy and avascular necrosis changes on the right. There is mild osteoarthritic change in the left hip joint. Electronically Signed   By: Lowella Grip III M.D.   On: 05/23/2016 10:57   Dg Femur, Min 2 Views Right  Result Date: 05/23/2016 CLINICAL DATA:  Intramedullary nail of right femur. Closed right femur fracture. EXAM: RIGHT FEMUR 2 VIEWS; DG C-ARM 61-120 MIN COMPARISON:  CT right knee 05/23/2016. Right knee radiographs 05/23/2016. FINDINGS: Intraoperative fluoroscopy is obtained for surgical control purposes. Fluoroscopy time is recorded at 1 minute 17 seconds. 5 spot fluoroscopic images are obtained. Spot fluoroscopic images demonstrate a pre-existing right total knee arthroplasty with periprosthetic supracondylar femoral metaphyseal fracture. The long intramedullary rod is placed with multiple distal locking screws and a single proximal locking screw. Fracture line remains visible but alignment appears near-anatomic. IMPRESSION: Intraoperative fluoroscopy obtained for surgical control purposes demonstrating intramedullary rod and screw fixation of a periprosthetic distal femoral metaphyseal fracture. Right knee arthroplasty is present. Electronically Signed   By: Lucienne Capers M.D.   On: 05/23/2016 21:53      Follow-up Information    [name], MD. Schedule an appointment as soon as possible for a visit.   Specialty:  Orthopedic Surgery Why:  NEED RETURN OFFICE VISIT 2 WEEKS POSTOP Contact information: [place] [address] [phone]           Discharge Plan:  discharge to SNF  Disposition:     Signed: Benjiman Core for Dr. Rodell Perna 05/25/2016, 1:24 PM

## 2016-05-26 DIAGNOSIS — E875 Hyperkalemia: Secondary | ICD-10-CM

## 2016-05-26 LAB — BASIC METABOLIC PANEL
Anion gap: 8 (ref 5–15)
BUN: 15 mg/dL (ref 6–20)
CO2: 30 mmol/L (ref 22–32)
Calcium: 8.6 mg/dL — ABNORMAL LOW (ref 8.9–10.3)
Chloride: 99 mmol/L — ABNORMAL LOW (ref 101–111)
Creatinine, Ser: 1.17 mg/dL — ABNORMAL HIGH (ref 0.44–1.00)
GFR calc Af Amer: 58 mL/min — ABNORMAL LOW (ref >60)
GFR calc non Af Amer: 50 mL/min — ABNORMAL LOW (ref >60)
Glucose, Bld: 108 mg/dL — ABNORMAL HIGH (ref 65–99)
Potassium: 5 mmol/L (ref 3.5–5.1)
Sodium: 137 mmol/L (ref 135–145)

## 2016-05-26 LAB — GLUCOSE, CAPILLARY
Glucose-Capillary: 137 mg/dL — ABNORMAL HIGH (ref 65–99)
Glucose-Capillary: 120 mg/dL — ABNORMAL HIGH (ref 65–99)
Glucose-Capillary: 271 mg/dL — ABNORMAL HIGH (ref 65–99)
Glucose-Capillary: 59 mg/dL — ABNORMAL LOW (ref 65–99)
Glucose-Capillary: 54 mg/dL — ABNORMAL LOW (ref 65–99)
Glucose-Capillary: 122 mg/dL — ABNORMAL HIGH (ref 65–99)

## 2016-05-26 LAB — CBC
HCT: 24.5 % — ABNORMAL LOW (ref 36.0–46.0)
Hemoglobin: 8.1 g/dL — ABNORMAL LOW (ref 12.0–15.0)
MCH: 29.9 pg (ref 26.0–34.0)
MCHC: 33.1 g/dL (ref 30.0–36.0)
MCV: 90.4 fL (ref 78.0–100.0)
Platelets: 259 10*3/uL (ref 150–400)
RBC: 2.71 MIL/uL — ABNORMAL LOW (ref 3.87–5.11)
RDW: 16.9 % — ABNORMAL HIGH (ref 11.5–15.5)
WBC: 8.6 10*3/uL (ref 4.0–10.5)

## 2016-05-26 LAB — MAGNESIUM: Magnesium: 1.7 mg/dL (ref 1.7–2.4)

## 2016-05-26 MED ORDER — LISINOPRIL 5 MG PO TABS
5.0000 mg | ORAL_TABLET | Freq: Every day | ORAL | Status: DC
  Administered 2016-05-26: 5 mg via ORAL
  Filled 2016-05-26: qty 1

## 2016-05-26 NOTE — Progress Notes (Signed)
PROGRESS NOTE    Debbie Mullins  LNL:[id number] DOB: October 07, 1955 DOA: 05/23/2016 PCP: Elyn Peers, MD    Brief Narrative:  61 y.o. female with pmg of DM2, htn, COPD, CKD who presented to the ED on 05/23/16 after fall at home on day prior. Pt states her knee 'gave out'. She denies any syncopal event, no LOC, no recent chest pain, sob, abdominal pain, n/v/d. CT in ED revealed right femoreal fracture s/p repair on 05/23/16. Post op labs this am showed K+ 6.7 prompting consult to [facility].   Upon talking to pt, she states that after fall at home, she sat in recliner and little to eat or drink prior to transfer to ED. She did continue taking medications. Again, she denies any recent chest pain, sob or palpitations.   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   Closed fracture of right distal femur (HCC)   Displaced supracondylar fracture without intracondylar extension of lower end of right femur, initial encounter for closed fracture ([place])   Hyperkalemia   Hyperkalemia -Potassium improved since initial consultation and remains stable -likely related to some mild dehydration in setting of poor oral intake preop and continuing home meds -No acute finding on recent Ekg -metformin, diuretic on hold -Will reduce dose of lisinopril from 10mg  to 5mg  -repeat bmet in AM  Hypomagnesemia -Corrected -continue to monitor  DM2 -holding metformin per above -Plan to continue SSI while inpt  Chronic renal insufficiency -baseline appears to be 1.1 -Cr stable thus far -Will recheck bmet in AM  Anemia, chronic normocytic -Remains stable. Will cont to monitor  DVT prophylaxis Per ortho service  DVT prophylaxis: SCD's Code Status: Full Family Communication: Pt in  room Disposition Plan:   Consultants:     Procedures:     Antimicrobials: Anti-infectives    Start     Dose/Rate Route Frequency Ordered Stop   05/24/16 0600  ceFAZolin (ANCEF) IVPB 2g/100 mL premix     2 g 200 mL/hr over 30 Minutes Intravenous On call to O.R. 05/23/16 1646 05/23/16 1926   05/23/16 1652  ceFAZolin (ANCEF) 2-4 GM/100ML-% IVPB    Comments:  Schonewitz, Leigh   : cabinet override      05/23/16 1652 05/23/16 1926      Subjective: Without complaints  Objective: Vitals:   05/25/16 1932 05/26/16 0700 05/26/16 0909 05/26/16 0912  BP: 106/70 (!) 120/58 (!) 153/75 109/72  Pulse: 95 85 91   Resp:   16   Temp: 98.4 F (36.9 C) 98.3 F (36.8 C)    TempSrc: Oral Oral    SpO2: 95% 98% 93%   Weight:      Height:        Intake/Output Summary (Last 24 hours) at 05/26/16 1501 Last data filed at 05/26/16 0900  Gross per 24 hour  Intake              240 ml  Output                0 ml  Net              240 ml   Filed Weights   05/23/16 1002  Weight: 89.8 kg (198 lb)    Examination:  General exam: awake, laying in bed, in nad Respiratory  system: normal resp effort, no audible wheezing Cardiovascular system: regular rate, s1-2 Gastrointestinal system: soft, nondistended, pos BS. Central nervous system: no seizures, no tremors. Extremities: no clubbing, perfused Skin: normal skin turgor, no notable skin lesions seen Psychiatry: mood normal// no visual hallucinations.   Data Reviewed: I have personally reviewed following labs and imaging studies  CBC:  Recent Labs Lab 05/23/16 1051 05/24/16 0010 05/24/16 0808 05/25/16 0404 05/26/16 0343  WBC 9.2 14.1* 8.3 10.2 8.6  NEUTROABS 5.5  --   --   --   --   HGB 11.3* 9.7* 9.3* 8.7* 8.1*  HCT 34.2* 30.5* 28.9* 26.9* 24.5*  MCV 89.3 90.0 89.2 90.9 90.4  PLT 351 310 269 274 962   Basic Metabolic Panel:  Recent Labs Lab 05/23/16 1051 05/24/16 0010 05/24/16 0808 05/24/16 1008 05/24/16 1545  05/25/16 0404 05/26/16 0343  NA 135 139 135  --   --  135 137  K 4.7 6.7* 5.4*  --  5.1 5.1 5.0  CL 101 110 104  --   --  102 99*  CO2 25 19* 22  --   --  25 30  GLUCOSE 106* 120* 110*  --   --  91 108*  BUN 25* 29* 20  --   --  15 15  CREATININE 1.10* 1.44* 1.12*  --   --  0.96 1.17*  CALCIUM 9.0 8.5* 8.1*  --   --  8.2* 8.6*  MG  --   --   --  1.5*  --   --  1.7  PHOS  --   --   --  4.5  --   --   --    GFR: Estimated Creatinine Clearance: 55.8 mL/min (A) (by C-G formula based on SCr of 1.17 mg/dL (H)). Liver Function Tests: No results for input(s): AST, ALT, ALKPHOS, BILITOT, PROT, ALBUMIN in the last 168 hours. No results for input(s): LIPASE, AMYLASE in the last 168 hours. No results for input(s): AMMONIA in the last 168 hours. Coagulation Profile:  Recent Labs Lab 05/23/16 1115  INR 1.02   Cardiac Enzymes:  Recent Labs Lab 05/23/16 1051  CKTOTAL 114   BNP (last 3 results) No results for input(s): PROBNP in the last 8760 hours. HbA1C:  Recent Labs  05/24/16 0010  HGBA1C 5.8*   CBG:  Recent Labs Lab 05/25/16 1212 05/25/16 1615 05/25/16 2111 05/26/16 0658 05/26/16 1156  GLUCAP 132* 81 175* 137* 120*   Lipid Profile: No results for input(s): CHOL, HDL, LDLCALC, TRIG, CHOLHDL, LDLDIRECT in the last 72 hours. Thyroid Function Tests: No results for input(s): TSH, T4TOTAL, FREET4, T3FREE, THYROIDAB in the last 72 hours. Anemia Panel: No results for input(s): VITAMINB12, FOLATE, FERRITIN, TIBC, IRON, RETICCTPCT in the last 72 hours. Sepsis Labs: No results for input(s): PROCALCITON, LATICACIDVEN in the last 168 hours.  Recent Results (from the past 240 hour(s))  Surgical pcr screen     Status: None   Collection Time: 05/23/16  5:37 PM  Result Value Ref Range Status   MRSA, PCR NEGATIVE NEGATIVE Final   Staphylococcus aureus NEGATIVE NEGATIVE Final    Comment:        The Xpert SA Assay (FDA approved for NASAL specimens in patients over 21 years of  age), is one component of a comprehensive surveillance program.  Test performance has been validated by [facility] for patients greater than or equal to 40 year old. It is not intended to diagnose infection nor to guide or monitor  treatment.      Radiology Studies: No results found.  Scheduled Meds: . amLODipine  5 mg Oral Daily  . aspirin EC  325 mg Oral Q breakfast  . buPROPion  150 mg Oral Daily  . docusate sodium  100 mg Oral BID  . feeding supplement (ENSURE ENLIVE)  237 mL Oral BID BM  . insulin aspart  0-15 Units Subcutaneous TID WC  . lisinopril  5 mg Oral Daily  . montelukast  10 mg Oral QHS  . pantoprazole  80 mg Oral Q1200  . sodium chloride  500 mL Intravenous Once   Continuous Infusions:   LOS: 3 days   Rafia Shedden, Orpah Melter, MD [facility] Pager [phone]  If 7PM-7AM, please contact night-coverage [email or web address] Password [email or web address] 05/26/2016, 3:01 PM

## 2016-05-26 NOTE — Clinical Social Work Note (Signed)
Per Dr. Louanne Skye, pt is not ready for d/c this weekend and will likely be ready Monday. CSW spoke with Jackelyn Poling at [facility] to provide update and they are prepared to accept her on Monday. Unit CSW will continue to follow for d/c needs.   Oretha Ellis, Latanya Presser, [place] Weekend Social Worker [phone]

## 2016-05-26 NOTE — Progress Notes (Signed)
     Subjective: 3 Days Post-Op Procedure(s) (LRB): INTRAMEDULLARY (IM) RETROGRADE FEMORAL NAILING (Right)  Patient reports pain as mild.    Objective:   VITALS:  Temp:  [98.3 F (36.8 C)-98.5 F (36.9 C)] 98.3 F (36.8 C) (03/17 0700) Pulse Rate:  [81-95] 91 (03/17 0909) Resp:  [16] 16 (03/17 0909) BP: (106-153)/(58-75) 109/72 (03/17 0912) SpO2:  [83 %-100 %] 93 % (03/17 0909) Awake, alert and oriented x 4. Mild pain. Awaiting SNF placement, likely transfer on Monday per patient.  Neurologically intact ABD soft Neurovascular intact Sensation intact distally Intact pulses distally Dorsiflexion/Plantar flexion intact Incision: no drainage No cellulitis present   LABS  Recent Labs  05/23/16 1051 05/24/16 0010 05/24/16 0808 05/25/16 0404 05/26/16 0343  HGB 11.3* 9.7* 9.3* 8.7* 8.1*  WBC 9.2 14.1* 8.3 10.2 8.6  PLT 351 310 269 274 259    Recent Labs  05/25/16 0404 05/26/16 0343  NA 135 137  K 5.1 5.0  CL 102 99*  CO2 25 30  BUN 15 15  CREATININE 0.96 1.17*  GLUCOSE 91 108*    Recent Labs  05/23/16 1115  INR 1.02     Assessment/Plan: 3 Days Post-Op Procedure(s) (LRB): INTRAMEDULLARY (IM) RETROGRADE FEMORAL NAILING (Right)  Advance diet Up with therapy D/C IV fluids  SNF on Monday.  Debbie Mullins 05/26/2016, 10:16 AM Patient ID: Debbie Mullins, female   DOB: [birth year], 61 y.o.   MRN: [id number]

## 2016-05-27 LAB — BASIC METABOLIC PANEL
Anion gap: 8 (ref 5–15)
BUN: 19 mg/dL (ref 6–20)
CO2: 27 mmol/L (ref 22–32)
Calcium: 8.2 mg/dL — ABNORMAL LOW (ref 8.9–10.3)
Chloride: 102 mmol/L (ref 101–111)
Creatinine, Ser: 1 mg/dL (ref 0.44–1.00)
GFR calc Af Amer: >60 mL/min (ref >60)
GFR calc non Af Amer: >60 mL/min (ref >60)
Glucose, Bld: 89 mg/dL (ref 65–99)
Potassium: 5.2 mmol/L — ABNORMAL HIGH (ref 3.5–5.1)
Sodium: 137 mmol/L (ref 135–145)

## 2016-05-27 LAB — GLUCOSE, CAPILLARY
Glucose-Capillary: 115 mg/dL — ABNORMAL HIGH (ref 65–99)
Glucose-Capillary: 139 mg/dL — ABNORMAL HIGH (ref 65–99)
Glucose-Capillary: 117 mg/dL — ABNORMAL HIGH (ref 65–99)
Glucose-Capillary: 198 mg/dL — ABNORMAL HIGH (ref 65–99)

## 2016-05-27 MED ORDER — SODIUM POLYSTYRENE SULFONATE 15 GM/60ML PO SUSP
45.0000 g | Freq: Once | ORAL | Status: AC
  Administered 2016-05-27: 45 g via ORAL
  Filled 2016-05-27: qty 180

## 2016-05-27 MED ORDER — SODIUM POLYSTYRENE SULFONATE 15 GM/60ML PO SUSP
30.0000 g | Freq: Once | ORAL | Status: AC
  Administered 2016-05-27: 30 g via ORAL
  Filled 2016-05-27: qty 120

## 2016-05-27 NOTE — Progress Notes (Signed)
     Subjective: 4 Days Post-Op Procedure(s) (LRB): INTRAMEDULLARY (IM) RETROGRADE FEMORAL NAILING (Right)Awake, alert and oriented x4. No PT yesterday, on O2 per nasal cannula,now weaned. No fever or chills.   Patient reports pain as moderate.    Objective:   VITALS:  Temp:  [98.2 F (36.8 C)-98.9 F (37.2 C)] 98.4 F (36.9 C) (03/18 0522) Pulse Rate:  [82-96] 96 (03/18 0855) Resp:  [16] 16 (03/17 1500) BP: (91-126)/(49-56) 110/51 (03/18 0855) SpO2:  [92 %-95 %] 93 % (03/18 0522)  Neurologically intact ABD soft Neurovascular intact Sensation intact distally Intact pulses distally Dorsiflexion/Plantar flexion intact Incision: no drainage   LABS  Recent Labs  05/25/16 0404 05/26/16 0343  HGB 8.7* 8.1*  WBC 10.2 8.6  PLT 274 259    Recent Labs  05/26/16 0343 05/27/16 0558  NA 137 137  K 5.0 5.2*  CL 99* 102  CO2 30 27  BUN 15 19  CREATININE 1.17* 1.00  GLUCOSE 108* 89   No results for input(s): LABPT, INR in the last 72 hours.   Assessment/Plan: 4 Days Post-Op Procedure(s) (LRB): INTRAMEDULLARY (IM) RETROGRADE FEMORAL NAILING (Right)  Advance diet Up with therapy Plan for discharge tomorrow Discharge to SNF  [name] 05/27/2016, 10:47 AM

## 2016-05-27 NOTE — Progress Notes (Signed)
PROGRESS NOTE    Debbie Mullins  PIR:[id number] DOB: 1955/09/14 DOA: 05/23/2016 PCP: Elyn Peers, MD    Brief Narrative:  61 y.o. female with pmg of DM2, htn, COPD, CKD who presented to the ED on 05/23/16 after fall at home on day prior. Pt states her knee 'gave out'. She denies any syncopal event, no LOC, no recent chest pain, sob, abdominal pain, n/v/d. CT in ED revealed right femoreal fracture s/p repair on 05/23/16. Post op labs this am showed K+ 6.7 prompting consult to [facility] hospital[facility].   Upon talking to pt, she states that after fall at home, she sat in recliner and little to eat or drink prior to transfer to ED. She did continue taking medications. Again, she denies any recent chest pain, sob or palpitations.   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   Closed fracture of right distal femur (HCC)   Displaced supracondylar fracture without intracondylar extension of lower end of right femur, initial encounter for closed fracture ([place])   Hyperkalemia   Hyperkalemia -Potassium improved since initial consultation and remains stable -likely related to some mild dehydration in setting of poor oral intake preop and continuing home meds -No acute finding on recent Ekg -metformin, diuretic remains on hold -Potassium of 5.2 this AM -Will discontinue lisinopril -Will repeat bmet in AM  Hypomagnesemia -Corrected -Will continue to monitor  DM2 -holding metformin per above -Will continue SSI while inpt  Chronic renal insufficiency -baseline appears to be 1.1 -Cr remains stable thus far -will repeat bmet in AM  Anemia, chronic normocytic -Currently stable. Will cont to monitor -Repeat CBC in AM  DVT prophylaxis Continue per ortho service  DVT prophylaxis:  SCD's Code Status: Full Family Communication: Pt in room Disposition Plan: Per primary service  Consultants:     Procedures:     Antimicrobials: Anti-infectives    Start     Dose/Rate Route Frequency Ordered Stop   05/24/16 0600  ceFAZolin (ANCEF) IVPB 2g/100 mL premix     2 g 200 mL/hr over 30 Minutes Intravenous On call to O.R. 05/23/16 1646 05/23/16 1926   05/23/16 1652  ceFAZolin (ANCEF) 2-4 GM/100ML-% IVPB    Comments:  Schonewitz, Leigh   : cabinet override      05/23/16 1652 05/23/16 1926      Subjective: No complaints  Objective: Vitals:   05/26/16 1900 05/27/16 0522 05/27/16 0855 05/27/16 1500  BP: (!) 91/49 (!) 126/51 (!) 110/51 104/65  Pulse: 89 82 96 91  Resp:      Temp: 98.2 F (36.8 C) 98.4 F (36.9 C)  99.1 F (37.3 C)  TempSrc: Oral Oral  Oral  SpO2: 92% 93%  97%  Weight:      Height:        Intake/Output Summary (Last 24 hours) at 05/27/16 1650 Last data filed at 05/27/16 0700  Gross per 24 hour  Intake              480 ml  Output                0 ml  Net              480 ml   Filed Weights   05/23/16 1002  Weight: 89.8 kg (198 lb)  Examination:  General exam: conversant, laying in bed, in nad Respiratory system: normal chest rise, no audible wheezing Cardiovascular system: regular rhythm, s1-2 on auscultation Gastrointestinal system: nontender, pos BS, nondistended Central nervous system: cn2-12 grossly intact, strength intact Extremities: no cyanosis, no joint deformities Skin: no rashes, no pallor Psychiatry: affect normal// no auditory hallucinations.   Data Reviewed: I have personally reviewed following labs and imaging studies  CBC:  Recent Labs Lab 05/23/16 1051 05/24/16 0010 05/24/16 0808 05/25/16 0404 05/26/16 0343  WBC 9.2 14.1* 8.3 10.2 8.6  NEUTROABS 5.5  --   --   --   --   HGB 11.3* 9.7* 9.3* 8.7* 8.1*  HCT 34.2* 30.5* 28.9* 26.9* 24.5*  MCV 89.3 90.0 89.2 90.9 90.4  PLT 351 310 269 274 518   Basic  Metabolic Panel:  Recent Labs Lab 05/24/16 0010 05/24/16 0808 05/24/16 1008 05/24/16 1545 05/25/16 0404 05/26/16 0343 05/27/16 0558  NA 139 135  --   --  135 137 137  K 6.7* 5.4*  --  5.1 5.1 5.0 5.2*  CL 110 104  --   --  102 99* 102  CO2 19* 22  --   --  25 30 27   GLUCOSE 120* 110*  --   --  91 108* 89  BUN 29* 20  --   --  15 15 19   CREATININE 1.44* 1.12*  --   --  0.96 1.17* 1.00  CALCIUM 8.5* 8.1*  --   --  8.2* 8.6* 8.2*  MG  --   --  1.5*  --   --  1.7  --   PHOS  --   --  4.5  --   --   --   --    GFR: Estimated Creatinine Clearance: 65.2 mL/min (by C-G formula based on SCr of 1 mg/dL). Liver Function Tests: No results for input(s): AST, ALT, ALKPHOS, BILITOT, PROT, ALBUMIN in the last 168 hours. No results for input(s): LIPASE, AMYLASE in the last 168 hours. No results for input(s): AMMONIA in the last 168 hours. Coagulation Profile:  Recent Labs Lab 05/23/16 1115  INR 1.02   Cardiac Enzymes:  Recent Labs Lab 05/23/16 1051  CKTOTAL 114   BNP (last 3 results) No results for input(s): PROBNP in the last 8760 hours. HbA1C: No results for input(s): HGBA1C in the last 72 hours. CBG:  Recent Labs Lab 05/26/16 2016 05/26/16 2053 05/26/16 2208 05/27/16 0726 05/27/16 1139  GLUCAP 59* 54* 122* 115* 139*   Lipid Profile: No results for input(s): CHOL, HDL, LDLCALC, TRIG, CHOLHDL, LDLDIRECT in the last 72 hours. Thyroid Function Tests: No results for input(s): TSH, T4TOTAL, FREET4, T3FREE, THYROIDAB in the last 72 hours. Anemia Panel: No results for input(s): VITAMINB12, FOLATE, FERRITIN, TIBC, IRON, RETICCTPCT in the last 72 hours. Sepsis Labs: No results for input(s): PROCALCITON, LATICACIDVEN in the last 168 hours.  Recent Results (from the past 240 hour(s))  Surgical pcr screen     Status: None   Collection Time: 05/23/16  5:37 PM  Result Value Ref Range Status   MRSA, PCR NEGATIVE NEGATIVE Final   Staphylococcus aureus NEGATIVE NEGATIVE Final      Comment:        The Xpert SA Assay (FDA approved for NASAL specimens in patients over 69 years of age), is one component of a comprehensive surveillance program.  Test performance has been validated by [facility] for patients greater than or equal to 65 year old. It is  not intended to diagnose infection nor to guide or monitor treatment.      Radiology Studies: No results found.  Scheduled Meds: . amLODipine  5 mg Oral Daily  . aspirin EC  325 mg Oral Q breakfast  . buPROPion  150 mg Oral Daily  . docusate sodium  100 mg Oral BID  . feeding supplement (ENSURE ENLIVE)  237 mL Oral BID BM  . insulin aspart  0-15 Units Subcutaneous TID WC  . montelukast  10 mg Oral QHS  . pantoprazole  80 mg Oral Q1200  . sodium chloride  500 mL Intravenous Once   Continuous Infusions:   LOS: 4 days   Baleigh Rennaker, Orpah Melter, MD [facility] Pager [phone]  If 7PM-7AM, please contact night-coverage [email or web address] Password [facility] 05/27/2016, 4:50 PM

## 2016-05-27 NOTE — Progress Notes (Signed)
Hypoglycemic Event  CBG: 59  Treatment: 4 oz orange juice  Symptoms: asymptomatic, pt stated she's fine  Follow-up CBG: Time: 2200 CBG Result:122  Possible Reasons for Event: recent insulin administration  Comments/MD notified: will continue to monitor blood sugar      Debbie Mullins Lyn A

## 2016-05-28 DIAGNOSIS — K219 Gastro-esophageal reflux disease without esophagitis: Secondary | ICD-10-CM | POA: Diagnosis not present

## 2016-05-28 DIAGNOSIS — I10 Essential (primary) hypertension: Secondary | ICD-10-CM | POA: Diagnosis not present

## 2016-05-28 DIAGNOSIS — M9711XD 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, subsequent encounter: Secondary | ICD-10-CM | POA: Diagnosis not present

## 2016-05-28 DIAGNOSIS — E1122 Type 2 diabetes mellitus with diabetic chronic kidney disease: Secondary | ICD-10-CM | POA: Diagnosis not present

## 2016-05-28 DIAGNOSIS — I509 Heart failure, unspecified: Secondary | ICD-10-CM | POA: Diagnosis not present

## 2016-05-28 DIAGNOSIS — E785 Hyperlipidemia, unspecified: Secondary | ICD-10-CM | POA: Diagnosis not present

## 2016-05-28 DIAGNOSIS — E119 Type 2 diabetes mellitus without complications: Secondary | ICD-10-CM | POA: Diagnosis not present

## 2016-05-28 DIAGNOSIS — R278 Other lack of coordination: Secondary | ICD-10-CM | POA: Diagnosis not present

## 2016-05-28 DIAGNOSIS — S72451A Displaced supracondylar fracture without intracondylar extension of lower end of right femur, initial encounter for closed fracture: Secondary | ICD-10-CM | POA: Diagnosis not present

## 2016-05-28 DIAGNOSIS — S79919A Unspecified injury of unspecified hip, initial encounter: Secondary | ICD-10-CM | POA: Diagnosis not present

## 2016-05-28 DIAGNOSIS — S7290XA Unspecified fracture of unspecified femur, initial encounter for closed fracture: Secondary | ICD-10-CM | POA: Diagnosis not present

## 2016-05-28 DIAGNOSIS — R2689 Other abnormalities of gait and mobility: Secondary | ICD-10-CM | POA: Diagnosis not present

## 2016-05-28 DIAGNOSIS — E875 Hyperkalemia: Secondary | ICD-10-CM | POA: Diagnosis not present

## 2016-05-28 DIAGNOSIS — J449 Chronic obstructive pulmonary disease, unspecified: Secondary | ICD-10-CM | POA: Diagnosis not present

## 2016-05-28 DIAGNOSIS — S728X9A Other fracture of unspecified femur, initial encounter for closed fracture: Secondary | ICD-10-CM | POA: Diagnosis not present

## 2016-05-28 DIAGNOSIS — I5033 Acute on chronic diastolic (congestive) heart failure: Secondary | ICD-10-CM | POA: Diagnosis not present

## 2016-05-28 LAB — CBC
HCT: 27.4 % — ABNORMAL LOW (ref 36.0–46.0)
Hemoglobin: 8.8 g/dL — ABNORMAL LOW (ref 12.0–15.0)
MCH: 29 pg (ref 26.0–34.0)
MCHC: 32.1 g/dL (ref 30.0–36.0)
MCV: 90.4 fL (ref 78.0–100.0)
Platelets: 324 10*3/uL (ref 150–400)
RBC: 3.03 MIL/uL — ABNORMAL LOW (ref 3.87–5.11)
RDW: 16.7 % — ABNORMAL HIGH (ref 11.5–15.5)
WBC: 5.9 10*3/uL (ref 4.0–10.5)

## 2016-05-28 LAB — GLUCOSE, CAPILLARY
Glucose-Capillary: 111 mg/dL — ABNORMAL HIGH (ref 65–99)
Glucose-Capillary: 205 mg/dL — ABNORMAL HIGH (ref 65–99)

## 2016-05-28 LAB — BASIC METABOLIC PANEL
Anion gap: 10 (ref 5–15)
BUN: 14 mg/dL (ref 6–20)
CO2: 28 mmol/L (ref 22–32)
Calcium: 7.8 mg/dL — ABNORMAL LOW (ref 8.9–10.3)
Chloride: 101 mmol/L (ref 101–111)
Creatinine, Ser: 0.8 mg/dL (ref 0.44–1.00)
GFR calc Af Amer: >60 mL/min (ref >60)
GFR calc non Af Amer: >60 mL/min (ref >60)
Glucose, Bld: 107 mg/dL — ABNORMAL HIGH (ref 65–99)
Potassium: 4.2 mmol/L (ref 3.5–5.1)
Sodium: 139 mmol/L (ref 135–145)

## 2016-05-28 MED ORDER — LISINOPRIL 5 MG PO TABS: 5.0000 mg | ORAL_TABLET | Freq: Every day | ORAL | 0 refills | Status: DC

## 2016-05-28 NOTE — Progress Notes (Addendum)
PROGRESS NOTE    Debbie Mullins  KNL:[id number] DOB: 05-30-1955 DOA: 05/23/2016 PCP: Elyn Peers, MD    Brief Narrative:  61 y.o. female with pmg of DM2, htn, COPD, CKD who presented to the ED on 05/23/16 after fall at home on day prior. Pt states her knee 'gave out'. She denies any syncopal event, no LOC, no recent chest pain, sob, abdominal pain, n/v/d. CT in ED revealed right femoreal fracture s/p repair on 05/23/16. Post op labs this am showed K+ 6.7 prompting consult to [facility] hospitalists.   Upon talking to pt, she states that after fall at home, she sat in recliner and little to eat or drink prior to transfer to ED. She did continue taking medications. Again, she denies any recent chest pain, sob or palpitations.   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 I have ordered this -Also recommend repeat BMET with PCP in 1 week  Hypomagnesemia -Corrected  DM2 -holding metformin per above -Will continue SSI while inpt -Would resume home meds on discharge  Chronic renal insufficiency -baseline appears to be 1.1 -Cr now 0.08  Anemia, chronic normocytic -Currently stable. Hgb has risen overnight  DVT prophylaxis Continue per ortho service  Chart reviewed. Patient is tentatively  planned for discharge today. Patient is medically stable for discharge per discretion of primary surgical service. Recommend close outpatient f/u with PCP in 1-2 weeks.  DVT prophylaxis: SCD's Code Status: Full Family Communication: Pt in room Disposition Plan: Per primary service  Consultants:     Procedures:     Antimicrobials: Anti-infectives    Start     Dose/Rate Route Frequency Ordered Stop   05/24/16 0600  ceFAZolin (ANCEF) IVPB 2g/100 mL premix     2 g 200 mL/hr over 30 Minutes Intravenous On call to O.R. 05/23/16 1646 05/23/16 1926   05/23/16 1652  ceFAZolin (ANCEF) 2-4 GM/100ML-% IVPB    Comments:  Schonewitz, Leigh   : cabinet override      05/23/16 1652 05/23/16 1926      Subjective: Without complaints   Objective: Vitals:   05/27/16 1500 05/27/16 1900 05/27/16 2247 05/28/16 0604  BP: 104/65 (!) 114/56 (!) 104/52 139/63  Pulse: 91 95 (!) 103 89  Resp:   18 16  Temp: 99.1 F (37.3 C) 98.2 F (36.8 C) 98.2 F (36.8 C) 98.9 F (37.2 C)  TempSrc: Oral Oral Oral Oral  SpO2: 97% 95% 94% 95%  Weight:      Height:        Intake/Output Summary (Last 24 hours) at 05/28/16 1022 Last data filed at 05/28/16 0900  Gross per 24 hour  Intake              720 ml  Output              600 ml  Net  120 ml   Filed Weights   05/23/16 1002  Weight: 89.8 kg (198 lb)    Examination:  General exam: Laying in bed, awake, in nad Respiratory system: normal resp effort, no wheezing Cardiovascular system: regular rrate, s1-s2 Gastrointestinal system: soft, nondistended, pos BS Central nervous system: no seizures, no tremors Extremities: no clubbing, perfused Skin: normal skin turgor, no notable skin lesions seen Psychiatry: mood normal// no visual hallucinations.   Data Reviewed: I have personally reviewed following labs and imaging studies  CBC:  Recent Labs Lab 05/23/16 1051 05/24/16 0010 05/24/16 0808 05/25/16 0404 05/26/16 0343  05/28/16 0617  WBC 9.2 14.1* 8.3 10.2 8.6 5.9  NEUTROABS 5.5  --   --   --   --   --   HGB 11.3* 9.7* 9.3* 8.7* 8.1* 8.8*  HCT 34.2* 30.5* 28.9* 26.9* 24.5* 27.4*  MCV 89.3 90.0 89.2 90.9 90.4 90.4  PLT 351 310 269 274 259 099   Basic Metabolic Panel:  Recent Labs Lab 05/24/16 0808 05/24/16 1008 05/24/16 1545 05/25/16 0404 05/26/16 0343 05/27/16 0558 05/28/16 0617  NA 135  --   --  135 137 137 139  K 5.4*  --  5.1 5.1 5.0 5.2* 4.2  CL 104  --   --  102 99* 102 101  CO2 22  --   --  25 30 27 28   GLUCOSE 110*  --   --  91 108* 89 107*  BUN 20  --   --  15 15 19 14   CREATININE 1.12*  --   --  0.96 1.17* 1.00 0.80  CALCIUM 8.1*  --   --  8.2* 8.6* 8.2* 7.8*  MG  --  1.5*  --   --  1.7  --   --   PHOS  --  4.5  --   --   --   --   --    GFR: Estimated Creatinine Clearance: 81.6 mL/min (by C-G formula based on SCr of 0.8 mg/dL). Liver Function Tests: No results for input(s): AST, ALT, ALKPHOS, BILITOT, PROT, ALBUMIN in the last 168 hours. No results for input(s): LIPASE, AMYLASE in the last 168 hours. No results for input(s): AMMONIA in the last 168 hours. Coagulation Profile:  Recent Labs Lab 05/23/16 1115  INR 1.02   Cardiac Enzymes:  Recent Labs Lab 05/23/16 1051  CKTOTAL 114   BNP (last 3 results) No results for input(s): PROBNP in the last 8760 hours. HbA1C: No results for input(s): HGBA1C in the last 72 hours. CBG:  Recent Labs Lab 05/27/16 0726 05/27/16 1139 05/27/16 1652 05/27/16 2245 05/28/16 0615  GLUCAP 115* 139* 117* 198* 111*   Lipid Profile: No results for input(s): CHOL, HDL, LDLCALC, TRIG, CHOLHDL, LDLDIRECT in the last 72 hours. Thyroid Function Tests: No results for input(s): TSH, T4TOTAL, FREET4, T3FREE, THYROIDAB in the last 72 hours. Anemia Panel: No results for input(s): VITAMINB12, FOLATE, FERRITIN, TIBC, IRON, RETICCTPCT in the last 72 hours. Sepsis Labs: No results for input(s): PROCALCITON, LATICACIDVEN in the last 168  hours.  Recent Results (from the past 240 hour(s))  Surgical pcr screen     Status: None   Collection Time: 05/23/16  5:37 PM  Result Value Ref Range Status   MRSA, PCR NEGATIVE NEGATIVE Final   Staphylococcus aureus NEGATIVE NEGATIVE Final    Comment:        The Xpert SA Assay (FDA approved for NASAL specimens in patients over 6 years of age), is one component  of a comprehensive surveillance program.  Test performance has been validated by [facility] for patients greater than or equal to 30 year old. It is not intended to diagnose infection nor to guide or monitor treatment.      Radiology Studies: No results found.  Scheduled Meds: . amLODipine  5 mg Oral Daily  . aspirin EC  325 mg Oral Q breakfast  . buPROPion  150 mg Oral Daily  . docusate sodium  100 mg Oral BID  . feeding supplement (ENSURE ENLIVE)  237 mL Oral BID BM  . insulin aspart  0-15 Units Subcutaneous TID WC  . montelukast  10 mg Oral QHS  . pantoprazole  80 mg Oral Q1200  . sodium chloride  500 mL Intravenous Once   Continuous Infusions:   LOS: 5 days   Dorthea Maina, Orpah Melter, MD [facility] Pager [phone]  If 7PM-7AM, please contact night-coverage [email or web address] Password [facility] 05/28/2016, 10:22 AM

## 2016-05-28 NOTE — Progress Notes (Signed)
Physical Therapy Treatment Patient Details Name: Debbie Mullins MRN: [id number] DOB: 1955-04-24 Today's Date: 05/28/2016    History of Present Illness 61 y.o. female admitted to [facility] on 05/23/16 for weakness, thrombocytosis, SOB, pulmonary nodules, peripherial edema, neck pain, HTN, DM, COPD, CHF, anemia, bil TKA, and ACDF (2016).     PT Comments    Pt increasing tolerance for standing this session and was able to perform standing X 3. Continues to require assist with maintenance of NWB on RLE. Continue to recommend SNF at d/c to maximize functional mobility independence. Will continue to follow.    Follow Up Recommendations  SNF;Supervision/Assistance - 24 hour     Equipment Recommendations  Wheelchair (measurements PT);Wheelchair cushion (measurements PT);Hospital bed    Recommendations for Other Services       Precautions / Restrictions Precautions Precautions: Fall;Other (comment) Precaution Comments: No knee flexion ROM Required Braces or Orthoses: Knee Immobilizer - Right Knee Immobilizer - Right: On at all times Restrictions Weight Bearing Restrictions: Yes RLE Weight Bearing: Non weight bearing    Mobility  Bed Mobility Overal bed mobility: Needs Assistance Bed Mobility: Supine to Sit;Sit to Supine     Supine to sit: Mod assist;HOB elevated Sit to supine: Max assist   General bed mobility comments: Mod A for transfer to sitting for RLE managment. Required max A for BLE for transfer back to supine. Verbal cues for sequencing.   Transfers Overall transfer level: Needs assistance Equipment used: Rolling walker (2 wheeled) Transfers: Sit to/from Stand Sit to Stand: +2 physical assistance;Mod assist;From elevated surface         General transfer comment: Mod A + 2 for lifting assist as well as maintenance of NWB precautions. Pt demonstrating increased tolerance for standing, however, required multiple verbal cues for maintenance of NWB precautions. Standing  X 3 for ~ 30 sec bouts. Pt still unsure about ability to perform stand pivot transfer  Ambulation/Gait                 Stairs            Wheelchair Mobility    Modified Rankin (Stroke Patients Only)       Balance Overall balance assessment: Needs assistance Sitting-balance support: Feet supported;Bilateral upper extremity supported;Single extremity supported Sitting balance-Leahy Scale: Fair Sitting balance - Comments: posterior preference in sitting   Standing balance support: Bilateral upper extremity supported Standing balance-Leahy Scale: Poor Standing balance comment: two person assist, reliance on bed and therapists for stability and use of RW.                     Cognition Arousal/Alertness: Awake/alert Behavior During Therapy: WFL for tasks assessed/performed Overall Cognitive Status: Within Functional Limits for tasks assessed                      Exercises Total Joint Exercises Ankle Circles/Pumps: AROM;Both;20 reps Quad Sets: AROM;Both;10 reps Gluteal Sets: AROM;Both;10 reps;Supine Hip ABduction/ADduction: AAROM;Right;10 reps;Supine (min A ) Straight Leg Raises: AAROM;Right;10 reps;Supine (mod A )    General Comments General comments (skin integrity, edema, etc.): Reviewed NWB and no ROM precautions with pt. Pt's sats >95% on 2L of oxygen this session.       Pertinent Vitals/Pain Pain Assessment: Faces Faces Pain Scale: Hurts a little bit Pain Location: right knee/leg Pain Descriptors / Indicators: Aching;Burning Pain Intervention(s): Limited activity within patient's tolerance;Monitored during session;Repositioned    Home Living  Prior Function            PT Goals (current goals can now be found in the care plan section) Acute Rehab PT Goals Patient Stated Goal: to get back to her prior level of function.  PT Goal Formulation: With patient Time For Goal Achievement: 05/31/16 Potential to  Achieve Goals: Good Progress towards PT goals: Progressing toward goals    Frequency    Min 3X/week      PT Plan Current plan remains appropriate    Co-evaluation             End of Session Equipment Utilized During Treatment: Gait belt;Right knee immobilizer;Oxygen Activity Tolerance: Patient limited by fatigue Patient left: in bed;with call bell/phone within reach;with bed alarm set Nurse Communication: Mobility status PT Visit Diagnosis: History of falling (Z91.81);Muscle weakness (generalized) (M62.81);Difficulty in walking, not elsewhere classified (R26.2);Pain Pain - Right/Left: Right Pain - part of body: Leg     Time: 7096-4383 PT Time Calculation (min) (ACUTE ONLY): 35 min  Charges:  $Therapeutic Exercise: 8-22 mins $Therapeutic Activity: 8-22 mins                    G Codes:       Mamie Levers 05/28/2016, 5:03 PM  Nicky Pugh, PT, DPT  Acute Rehabilitation Services  Pager: [phone]

## 2016-05-28 NOTE — Progress Notes (Signed)
Subjective: Patient doing well this morning. Good pain control right knee. States that she is ready for transfer to skilled facility.   Objective: Vital signs in last 24 hours: Temp:  [98.2 F (36.8 C)-99.1 F (37.3 C)] 98.9 F (37.2 C) (03/19 0604) Pulse Rate:  [89-103] 89 (03/19 0604) Resp:  [16-18] 16 (03/19 0604) BP: (104-139)/(52-65) 139/63 (03/19 0604) SpO2:  [94 %-97 %] 95 % (03/19 0604)  Intake/Output from previous day: 03/18 0701 - 03/19 0700 In: 720 [P.O.:720] Out: -  Intake/Output this shift: Total I/O In: 240 [P.O.:240] Out: 600 [Urine:600]   Recent Labs  05/26/16 0343 05/28/16 0617  HGB 8.1* 8.8*    Recent Labs  05/26/16 0343 05/28/16 0617  WBC 8.6 5.9  RBC 2.71* 3.03*  HCT 24.5* 27.4*  PLT 259 324    Recent Labs  05/27/16 0558 05/28/16 0617  NA 137 139  K 5.2* 4.2  CL 102 101  CO2 27 28  BUN 19 14  CREATININE 1.00 0.80  GLUCOSE 89 107*  CALCIUM 8.2* 7.8*   No results for input(s): LABPT, INR in the last 72 hours.  Exam: Very pleasant female alert and oriented and in no acute distress. Right knee wound looks good. Staples intact. No drainage or signs of infection. Calf nontender and neurovascularly intact.   Assessment/Plan: Orthopedic standpoint patient is stable and ready for transfer to skilled facility today. Scripts on chart. Continue aspirin 325 mg at least 4 weeks postoperative DVT prophylaxis. Follow-up with Dr. Lorin Mercy in 2 weeks for recheck. Facility can remove the staples 2 weeks postop providing that the wound is healing without any complications.   Benjiman Core 05/28/2016, 11:24 AM

## 2016-05-28 NOTE — Care Management Important Message (Signed)
Important Message  Patient Details  Name: Debbie Mullins MRN: [id number] Date of Birth: [birth year]   Medicare Important Message Given:  Yes    Orbie Pyo 05/28/2016, 12:18 PM

## 2016-05-28 NOTE — Clinical Social Work Placement (Signed)
   CLINICAL SOCIAL WORK PLACEMENT  NOTE  Date:  05/28/2016  Patient Details  Name: Debbie Mullins MRN: [id number] Date of Birth: 1956/02/04  Clinical Social Work is seeking post-discharge placement for this patient at the [place] level of care (*CSW will initial, date and re-position this form in  chart as items are completed):      Patient/family provided with [facility] Work Department's list of facilities offering this level of care within the geographic area requested by the patient (or if unable, by the patient's family).  Yes   Patient/family informed of their freedom to choose among providers that offer the needed level of care, that participate in Medicare, Medicaid or managed care program needed by the patient, have an available bed and are willing to accept the patient.      Patient/family informed of [facility]'s ownership interest in [facility] and [facility], as well as of the fact that they are under no obligation to receive care at these facilities.  PASRR submitted to EDS on       PASRR number received on 05/25/16     Existing PASRR number confirmed on       FL2 transmitted to all facilities in geographic area requested by pt/family on 05/25/16     FL2 transmitted to all facilities within larger geographic area on       Patient informed that his/her managed care company has contracts with or will negotiate with certain facilities, including the following:        Yes   Patient/family informed of bed offers received.  Patient chooses bed at [facility] [place] [facility]     Physician recommends and patient chooses bed at      Patient to be transferred to [facility] [place] on 05/28/16.  Patient to be transferred to facility by [facility]     Patient family notified on 05/28/16 of transfer.  Name of family member notified:        PHYSICIAN       Additional Comment:     _______________________________________________ Eileen Stanford, LCSW 05/28/2016, 10:29 AM

## 2016-05-29 DIAGNOSIS — M9711XD 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, subsequent encounter: Secondary | ICD-10-CM | POA: Diagnosis not present

## 2016-05-29 DIAGNOSIS — E119 Type 2 diabetes mellitus without complications: Secondary | ICD-10-CM | POA: Diagnosis not present

## 2016-05-29 DIAGNOSIS — J449 Chronic obstructive pulmonary disease, unspecified: Secondary | ICD-10-CM | POA: Diagnosis not present

## 2016-05-29 DIAGNOSIS — I10 Essential (primary) hypertension: Secondary | ICD-10-CM | POA: Diagnosis not present

## 2016-05-31 ENCOUNTER — Other Ambulatory Visit: Payer: Self-pay | Admitting: *Deleted

## 2016-05-31 NOTE — Patient Outreach (Signed)
[facility] [facility]) Care Management  05/31/2016  DANALEE FLATH [birth year] [id number]   Met with patient and her cousin at facility.  Patient reports she lives with her cousin. Patient states she has transportation and has good drug coverage with her Medicare plan.   RNCM reviewed [facility] , left brochure for patient to review.  Plan to follow up as indicated closer to discharge.   Royetta Crochet. Laymond Purser, RN, BSN, [place] [phone]) Business Cell  [phone]) Toll Free Office

## 2016-06-01 DIAGNOSIS — J449 Chronic obstructive pulmonary disease, unspecified: Secondary | ICD-10-CM | POA: Diagnosis not present

## 2016-06-01 DIAGNOSIS — E119 Type 2 diabetes mellitus without complications: Secondary | ICD-10-CM | POA: Diagnosis not present

## 2016-06-01 DIAGNOSIS — I10 Essential (primary) hypertension: Secondary | ICD-10-CM | POA: Diagnosis not present

## 2016-06-01 DIAGNOSIS — I509 Heart failure, unspecified: Secondary | ICD-10-CM | POA: Diagnosis not present

## 2016-06-01 DIAGNOSIS — S7290XA Unspecified fracture of unspecified femur, initial encounter for closed fracture: Secondary | ICD-10-CM | POA: Diagnosis not present

## 2016-06-05 ENCOUNTER — Inpatient Hospital Stay (INDEPENDENT_AMBULATORY_CARE_PROVIDER_SITE_OTHER): Payer: Self-pay | Admitting: Orthopaedic Surgery

## 2016-06-07 DIAGNOSIS — E119 Type 2 diabetes mellitus without complications: Secondary | ICD-10-CM | POA: Diagnosis not present

## 2016-06-07 DIAGNOSIS — I10 Essential (primary) hypertension: Secondary | ICD-10-CM | POA: Diagnosis not present

## 2016-06-07 DIAGNOSIS — M9711XD 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, subsequent encounter: Secondary | ICD-10-CM | POA: Diagnosis not present

## 2016-06-07 DIAGNOSIS — J449 Chronic obstructive pulmonary disease, unspecified: Secondary | ICD-10-CM | POA: Diagnosis not present

## 2016-06-07 NOTE — Progress Notes (Signed)
Pt discharged to [facility] SNF on 05/28/2016. Medication reconciliation completed on 3/29 by PharmD with [place]. Pt to take furosemide every other day on discharge but was started on daily furosemide at SNF. Pt to take lisinopril 10 mg daily on discharge summary but 5mg  on after visit summary. Notified nurse Jarold Song to report discrepancies to MD.  Angela Burke, PharmD, BCPS Pharmacy Resident Pager: [phone]

## 2016-06-18 ENCOUNTER — Telehealth (INDEPENDENT_AMBULATORY_CARE_PROVIDER_SITE_OTHER): Payer: Self-pay | Admitting: Radiology

## 2016-06-18 NOTE — Telephone Encounter (Signed)
Debbie Mullins with [employer] called stating Medicare wants the patient to be discharged today from the rehab facility and wants [place] to call in orders for home health so that this can be authorized. Patient is currently at [facility]. [name] does not have a fax number, but states that we can call [phone] with orders. She does not have a direct call back line. CB number for her is [phone].

## 2016-06-18 NOTE — Telephone Encounter (Signed)
I called and spoke with Chazon at [facility]. She put notes in chart and is sending back for someone there to call physician at rehab facility.

## 2016-06-18 NOTE — Telephone Encounter (Signed)
I spoke with Dr. Lorin Mercy on the phone in between his surgery cases. He states that they need to call the facility in which the patient is staying and get the information from the physician who is taking care of the patient there. He did not admit, and that physician will be the one that decides when the patient is well enough to leave and they prescribe home health.

## 2016-06-19 ENCOUNTER — Other Ambulatory Visit: Payer: Self-pay | Admitting: *Deleted

## 2016-06-19 ENCOUNTER — Ambulatory Visit (INDEPENDENT_AMBULATORY_CARE_PROVIDER_SITE_OTHER): Payer: Self-pay | Admitting: Orthopaedic Surgery

## 2016-06-19 NOTE — Patient Outreach (Signed)
[facility]) Care Management  06/19/2016  Debbie Mullins Mar 10, 1956 [id number]   Met with SW at facility to follow up on patient progress toward discharge. She reports patient will remain LTC at facility, she states that patient had been living with her cousin and the cousin does not feel she can care for patient due to increased level of care. Medicaid has been applied for at this time to remain at facility for LTC.  Plan to sign off as not [facility] community care management needs as patient will remain LTC. Royetta Crochet. Laymond Purser, RN, BSN, [place] [phone]) Business Cell  [phone]) Toll Free Office

## 2016-06-20 ENCOUNTER — Ambulatory Visit (INDEPENDENT_AMBULATORY_CARE_PROVIDER_SITE_OTHER): Payer: Medicare Other | Admitting: Family

## 2016-06-20 ENCOUNTER — Ambulatory Visit (INDEPENDENT_AMBULATORY_CARE_PROVIDER_SITE_OTHER): Payer: Medicare Other

## 2016-06-20 DIAGNOSIS — M9711XD 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, subsequent encounter: Secondary | ICD-10-CM

## 2016-06-20 NOTE — Progress Notes (Signed)
Post-Op Visit Note   Patient: Debbie Mullins           Date of Birth: 08-Jun-1955           MRN: [id number] Visit Date: 06/20/2016 PCP: Elyn Peers, MD  Chief Complaint: No chief complaint on file.   HPI:  The patient is a 61 year old woman who presents today status post IM nailing for a periprosthetic femur fracture, right. Has been residing at [facility] for her rehabilitation. She is in a knee immobilizer on the right in a wheelchair for ambulation. States she has been nonweightbearing.   Does state there is some confusion over her payor source and states the facility he is refusing to work with her for physical therapy.    Ortho Exam Incisions are all well healed. Staples harvested today. no drainage no surrounding erythema no odor no sign of infection.   Visit Diagnoses:  1. 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 Continue nonweightbearing on the right. She'll follow-up in office in 3 weeks with Dr. Lorin Mercy with repeat radiographs of the right femur.   Follow-Up Instructions: Return in about 2 weeks (around 07/04/2016) for c yates or james.   Imaging: No results found.  Orders:  Orders Placed This Encounter  Procedures  . XR FEMUR, MIN 2 VIEWS RIGHT   No orders of the defined types were placed in this encounter.    PMFS History: Patient Active Problem List   Diagnosis Date Noted  . Hyperkalemia 05/24/2016  . Displaced supracondylar fracture without intracondylar extension of lower end of right femur, initial encounter for closed fracture ([place]) 05/23/2016  . 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 Closed fracture of right distal femur ([place])   . Osteoarthritis of spine with myelopathy, cervical region 11/17/2014  . Failed total left knee replacement ([place]) 08/10/2013  . Pulmonary nodules/lesions, multiple 08/09/2013    . HLD (hyperlipidemia) 01/22/2012  . Pulmonary hypertension 01/08/2012  . Acute on chronic diastolic heart failure ([place]) 01/05/2012  . Acute respiratory failure with hypoxia ([place]) 01/03/2012  . Pulmonary edema, acute ([place]) 01/03/2012  . 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 HTN (hypertension) 01/02/2012  . COPD with acute exacerbation ([place]) 01/02/2012  . Tobacco abuse 07/04/2011  . 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 Leukocytosis 02/09/2011  . Normocytic anemia 02/09/2011  . Mild Thrombocytosis 02/09/2011  . COPD (chronic obstructive pulmonary disease) ([place]) 02/09/2011  . Obesity 02/09/2011   Past Medical History:  Diagnosis Date  . Anemia   . Asthma   . CHF (congestive heart failure) (HCC)    takes Furosemide daily   . COPD (chronic obstructive pulmonary disease) (HCC)    Albuterol inhaler prn;SIngulair at night  . Depression    takes Wellbutrin daily  . Diabetes mellitus    takes Metformin daily  . GERD (gastroesophageal reflux disease)    takes Nexium daily  . History of colon polyps    benign  . Hypertension    takes Lisinopril daily  . Joint pain   . Joint swelling   . Leukocytosis 02/09/2011  . Neck pain    HNP  . Normocytic anemia 02/09/2011  . Peripheral edema    takes Furosemide daily  . Pneumonia    many yrs ago  . Pulmonary nodules/lesions, multiple 08/09/2013  . Shortness of breath dyspnea    with exertion  . Thrombocytosis (  [place]) 02/09/2011  . Urinary frequency   . Urinary urgency   . Weakness    numbness and tingling in both hands    Family History  Problem Relation Age of Onset  . Heart failure Mother   . Cancer Maternal Grandmother     Past Surgical History:  Procedure Laterality Date  . ANTERIOR CERVICAL DECOMP/DISCECTOMY FUSION N/A 11/17/2014   Procedure: Cervical four- cervical five Anterior Cervical Decompression with fusion and bonegraft;  Surgeon: Ashok Pall, MD;  Location: [place] [facility];  Service: Neurosurgery;   Laterality: N/A;  C45 anterior cervical decompression with fusion plating and bonegraft  . COLONOSCOPY    . FEMUR IM NAIL Right 05/23/2016  . FEMUR IM NAIL Right 05/23/2016   Procedure: INTRAMEDULLARY (IM) RETROGRADE FEMORAL NAILING;  Surgeon: [name], MD;  Location: [place];  Service: Orthopedics;  Laterality: Right;  . KNEE ARTHROPLASTY Left 08/10/2013   Procedure:  TOTAL KNEE ARTHROPLASTY;  Surgeon: [name], MD;  Location: [place];  Service: Orthopedics;  Laterality: Left;  Left Total Knee Arthroplasty Revision, Cemented, Semi-constrained,   . LEFT AND RIGHT HEART CATHETERIZATION WITH CORONARY ANGIOGRAM N/A 01/07/2012   Procedure: LEFT AND RIGHT HEART CATHETERIZATION WITH CORONARY ANGIOGRAM;  Surgeon: Thayer Headings, MD;  Location: [facility] CATH LAB;  Service: Cardiovascular;  Laterality: N/A;  . TONSILLECTOMY     as a child  . TOTAL KNEE ARTHROPLASTY Bilateral    Social History   Occupational History  . Not on file.   Social History Main Topics  . Smoking status: Current Every Day Smoker    Packs/day: 0.50    Years: 41.00    Types: Cigarettes  . Smokeless tobacco: Never Used  . Alcohol use Yes     Comment: wine  . Drug use: No  . Sexual activity: No

## 2016-06-22 DIAGNOSIS — J449 Chronic obstructive pulmonary disease, unspecified: Secondary | ICD-10-CM | POA: Diagnosis not present

## 2016-06-22 DIAGNOSIS — S7290XA Unspecified fracture of unspecified femur, initial encounter for closed fracture: Secondary | ICD-10-CM | POA: Diagnosis not present

## 2016-06-22 DIAGNOSIS — I509 Heart failure, unspecified: Secondary | ICD-10-CM | POA: Diagnosis not present

## 2016-06-22 DIAGNOSIS — I10 Essential (primary) hypertension: Secondary | ICD-10-CM | POA: Diagnosis not present

## 2016-06-24 DIAGNOSIS — I10 Essential (primary) hypertension: Secondary | ICD-10-CM | POA: Diagnosis not present

## 2016-06-24 DIAGNOSIS — J449 Chronic obstructive pulmonary disease, unspecified: Secondary | ICD-10-CM | POA: Diagnosis not present

## 2016-06-24 DIAGNOSIS — M9711XD 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, subsequent encounter: Secondary | ICD-10-CM | POA: Diagnosis not present

## 2016-06-24 DIAGNOSIS — E119 Type 2 diabetes mellitus without complications: Secondary | ICD-10-CM | POA: Diagnosis not present

## 2016-06-29 DIAGNOSIS — M25561 Pain in right knee: Secondary | ICD-10-CM | POA: Diagnosis not present

## 2016-06-29 DIAGNOSIS — J449 Chronic obstructive pulmonary disease, unspecified: Secondary | ICD-10-CM | POA: Diagnosis not present

## 2016-06-29 DIAGNOSIS — E119 Type 2 diabetes mellitus without complications: Secondary | ICD-10-CM | POA: Diagnosis not present

## 2016-06-29 DIAGNOSIS — I10 Essential (primary) hypertension: Secondary | ICD-10-CM | POA: Diagnosis not present

## 2016-06-29 DIAGNOSIS — S7290XA Unspecified fracture of unspecified femur, initial encounter for closed fracture: Secondary | ICD-10-CM | POA: Diagnosis not present

## 2016-07-06 ENCOUNTER — Inpatient Hospital Stay (INDEPENDENT_AMBULATORY_CARE_PROVIDER_SITE_OTHER): Payer: Self-pay | Admitting: Orthopaedic Surgery

## 2016-07-06 DIAGNOSIS — M9711XD 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, subsequent encounter: Secondary | ICD-10-CM | POA: Diagnosis not present

## 2016-07-06 DIAGNOSIS — N181 Chronic kidney disease, stage 1: Secondary | ICD-10-CM | POA: Diagnosis not present

## 2016-07-06 DIAGNOSIS — J449 Chronic obstructive pulmonary disease, unspecified: Secondary | ICD-10-CM | POA: Diagnosis not present

## 2016-07-12 DIAGNOSIS — E119 Type 2 diabetes mellitus without complications: Secondary | ICD-10-CM | POA: Diagnosis not present

## 2016-07-12 DIAGNOSIS — R21 Rash and other nonspecific skin eruption: Secondary | ICD-10-CM | POA: Diagnosis not present

## 2016-07-12 DIAGNOSIS — Z720 Tobacco use: Secondary | ICD-10-CM | POA: Diagnosis not present

## 2016-07-12 DIAGNOSIS — M9711XD 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, subsequent encounter: Secondary | ICD-10-CM | POA: Diagnosis not present

## 2016-07-13 ENCOUNTER — Ambulatory Visit (INDEPENDENT_AMBULATORY_CARE_PROVIDER_SITE_OTHER): Payer: Medicare Other

## 2016-07-13 ENCOUNTER — Encounter (INDEPENDENT_AMBULATORY_CARE_PROVIDER_SITE_OTHER): Payer: Self-pay | Admitting: Orthopaedic Surgery

## 2016-07-13 ENCOUNTER — Ambulatory Visit (INDEPENDENT_AMBULATORY_CARE_PROVIDER_SITE_OTHER): Payer: Medicare Other | Admitting: Orthopaedic Surgery

## 2016-07-13 VITALS — BP 125/75 | HR 85 | Ht 63.0 in | Wt 198.0 lb

## 2016-07-13 DIAGNOSIS — M9711XD 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, subsequent encounter: Secondary | ICD-10-CM | POA: Diagnosis not present

## 2016-07-13 NOTE — Progress Notes (Signed)
Post-Op Visit Note   Patient: Debbie Mullins           Date of Birth: [birth year]           MRN: [id number] Visit Date: 07/13/2016 PCP: Elyn Peers, MD   Assessment & Plan: She can begin 50% weightbearing with walker for the next 4 weeks return 4 weeks for repeat x-rays on return.  Chief Complaint:  Chief Complaint  Patient presents with  . Right Leg - Routine Post Op   Visit Diagnoses:  1. 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, subsequent encounter     Plan: 50% weightbearing with walker. Also 4 weeks.  Follow-Up Instructions: No Follow-up on file.   Orders:  Orders Placed This Encounter  Procedures  . XR FEMUR, MIN 2 VIEWS RIGHT   No orders of the defined types were placed in this encounter.   Imaging: Xr Femur, Min 2 Views Right  Result Date: 07/13/2016 Two-view x-rays right femur obtained which show supracondylar femur fracture with interlock proximal and distal. She has severe right hip osteoarthritis with erosive where the femoral head which may be post changes from avascular necrosis. Impression: Supracondylar femur fracture, right with the maintenance of position.   PMFS History: Patient Active Problem List   Diagnosis Date Noted  . Hyperkalemia 05/24/2016  . Displaced supracondylar fracture without intracondylar extension of lower end of right femur, initial encounter for closed fracture ([place]) 05/23/2016  . 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 Closed fracture of right distal femur ([place])   . Osteoarthritis of spine with myelopathy, cervical region 11/17/2014  . Failed total left knee replacement ([place]) 08/10/2013  . Pulmonary nodules/lesions, multiple 08/09/2013  . HLD (hyperlipidemia) 01/22/2012  . Pulmonary hypertension ([place]) 01/08/2012  . Acute on chronic diastolic heart failure ([place]) 01/05/2012  . Acute respiratory failure with hypoxia ([place]) 01/03/2012  . Pulmonary edema, acute ([place])  01/03/2012  . 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 HTN (hypertension) 01/02/2012  . COPD with acute exacerbation ([employer]) 01/02/2012  . Tobacco abuse 07/04/2011  . 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 Leukocytosis 02/09/2011  . Normocytic anemia 02/09/2011  . Mild Thrombocytosis 02/09/2011  . COPD (chronic obstructive pulmonary disease) ([place]) 02/09/2011  . Obesity 02/09/2011   Past Medical History:  Diagnosis Date  . Anemia   . Asthma   . CHF (congestive heart failure) (HCC)    takes Furosemide daily   . COPD (chronic obstructive pulmonary disease) (HCC)    Albuterol inhaler prn;SIngulair at night  . Depression    takes Wellbutrin daily  . Diabetes mellitus    takes Metformin daily  . GERD (gastroesophageal reflux disease)    takes Nexium daily  . History of colon polyps    benign  . Hypertension    takes Lisinopril daily  . Joint pain   . Joint swelling   . Leukocytosis 02/09/2011  . Neck pain    HNP  . Normocytic anemia 02/09/2011  . Peripheral edema    takes Furosemide daily  . Pneumonia    many yrs ago  . Pulmonary nodules/lesions, multiple 08/09/2013  . Shortness of breath dyspnea    with exertion  . Thrombocytosis ([place]) 02/09/2011  . Urinary frequency   . Urinary urgency   . Weakness    numbness and tingling in both hands    Family History  Problem Relation Age of Onset  . Heart failure Mother   . Cancer  Maternal Grandmother     Past Surgical History:  Procedure Laterality Date  . ANTERIOR CERVICAL DECOMP/DISCECTOMY FUSION N/A 11/17/2014   Procedure: Cervical four- cervical five Anterior Cervical Decompression with fusion and bonegraft;  Surgeon: Ashok Pall, MD;  Location: [place] [facility]UR[facility]S;  Service: Neurosurgery;  Laterality: N/A;  C45 anterior cervical decompression with fusion plating and bonegraft  . COLONOSCOPY    . FEMUR IM NAIL Right 05/23/2016  . FEMUR IM NAIL Right 05/23/2016   Procedure: INTRAMEDULLARY (IM) RETROGRADE FEMORAL NAILING;   Surgeon: [name], MD;  Location: [place];  Service: Orthopedics;  Laterality: Right;  . KNEE ARTHROPLASTY Left 08/10/2013   Procedure:  TOTAL KNEE ARTHROPLASTY;  Surgeon: [name], MD;  Location: [place];  Service: Orthopedics;  Laterality: Left;  Left Total Knee Arthroplasty Revision, Cemented, Semi-constrained,   . LEFT AND RIGHT HEART CATHETERIZATION WITH CORONARY ANGIOGRAM N/A 01/07/2012   Procedure: LEFT AND RIGHT HEART CATHETERIZATION WITH CORONARY ANGIOGRAM;  Surgeon: Thayer Headings, MD;  Location: [facility] CATH LAB;  Service: Cardiovascular;  Laterality: N/A;  . TONSILLECTOMY     as a child  . TOTAL KNEE ARTHROPLASTY Bilateral    Social History   Occupational History  . Not on file.   Social History Main Topics  . Smoking status: Current Every Day Smoker    Packs/day: 0.50    Years: 41.00    Types: Cigarettes  . Smokeless tobacco: Never Used  . Alcohol use Yes     Comment: wine  . Drug use: No  . Sexual activity: No

## 2016-07-27 DIAGNOSIS — I509 Heart failure, unspecified: Secondary | ICD-10-CM | POA: Diagnosis not present

## 2016-07-27 DIAGNOSIS — I10 Essential (primary) hypertension: Secondary | ICD-10-CM | POA: Diagnosis not present

## 2016-07-27 DIAGNOSIS — E119 Type 2 diabetes mellitus without complications: Secondary | ICD-10-CM | POA: Diagnosis not present

## 2016-07-27 DIAGNOSIS — J449 Chronic obstructive pulmonary disease, unspecified: Secondary | ICD-10-CM | POA: Diagnosis not present

## 2016-07-30 IMAGING — CR DG CERVICAL SPINE 2 OR 3 VIEWS
2 series · 2 of 2 positions shown · non-contrast
Comparison: 10/11/2014

CLINICAL DATA: Anterior cervical decompression

EXAM:
CERVICAL SPINE - 2-3 VIEW

[lat (1 of 2)]
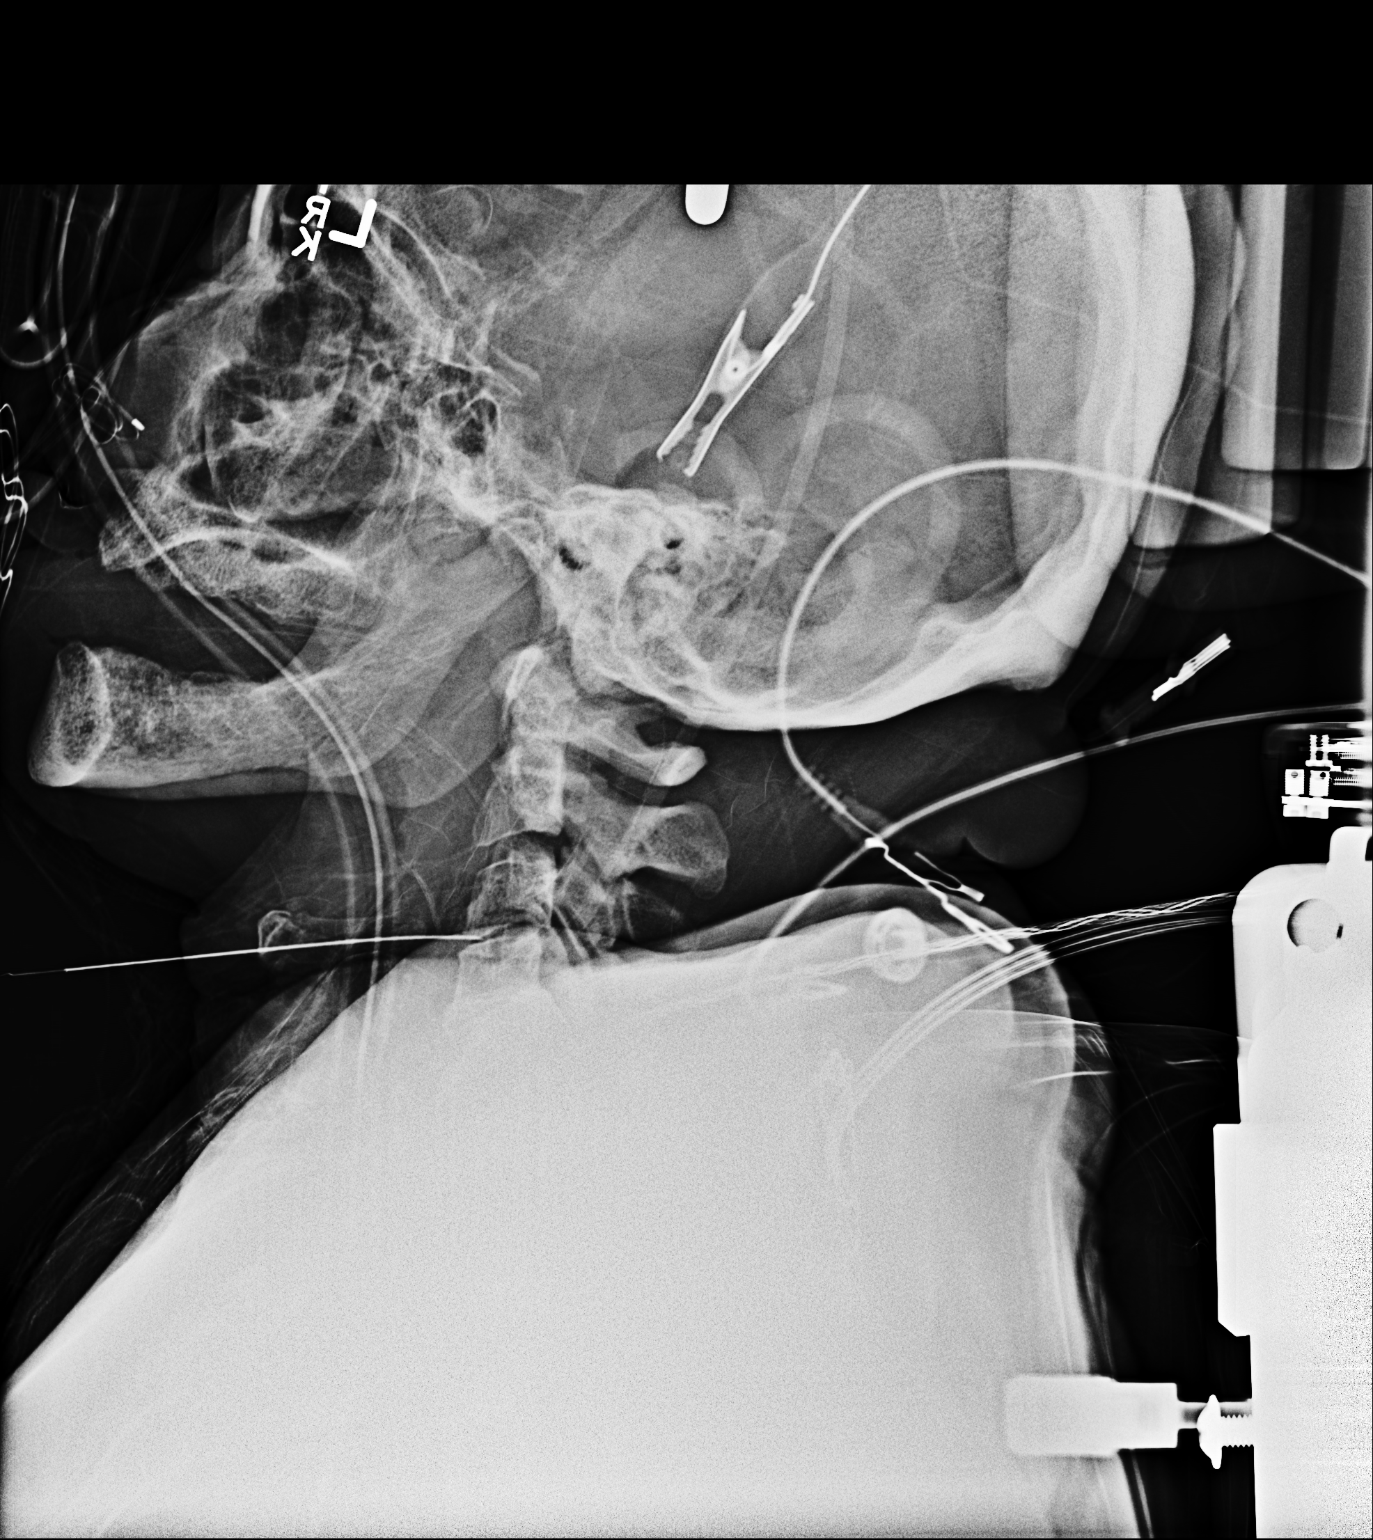

[lat (2 of 2)]
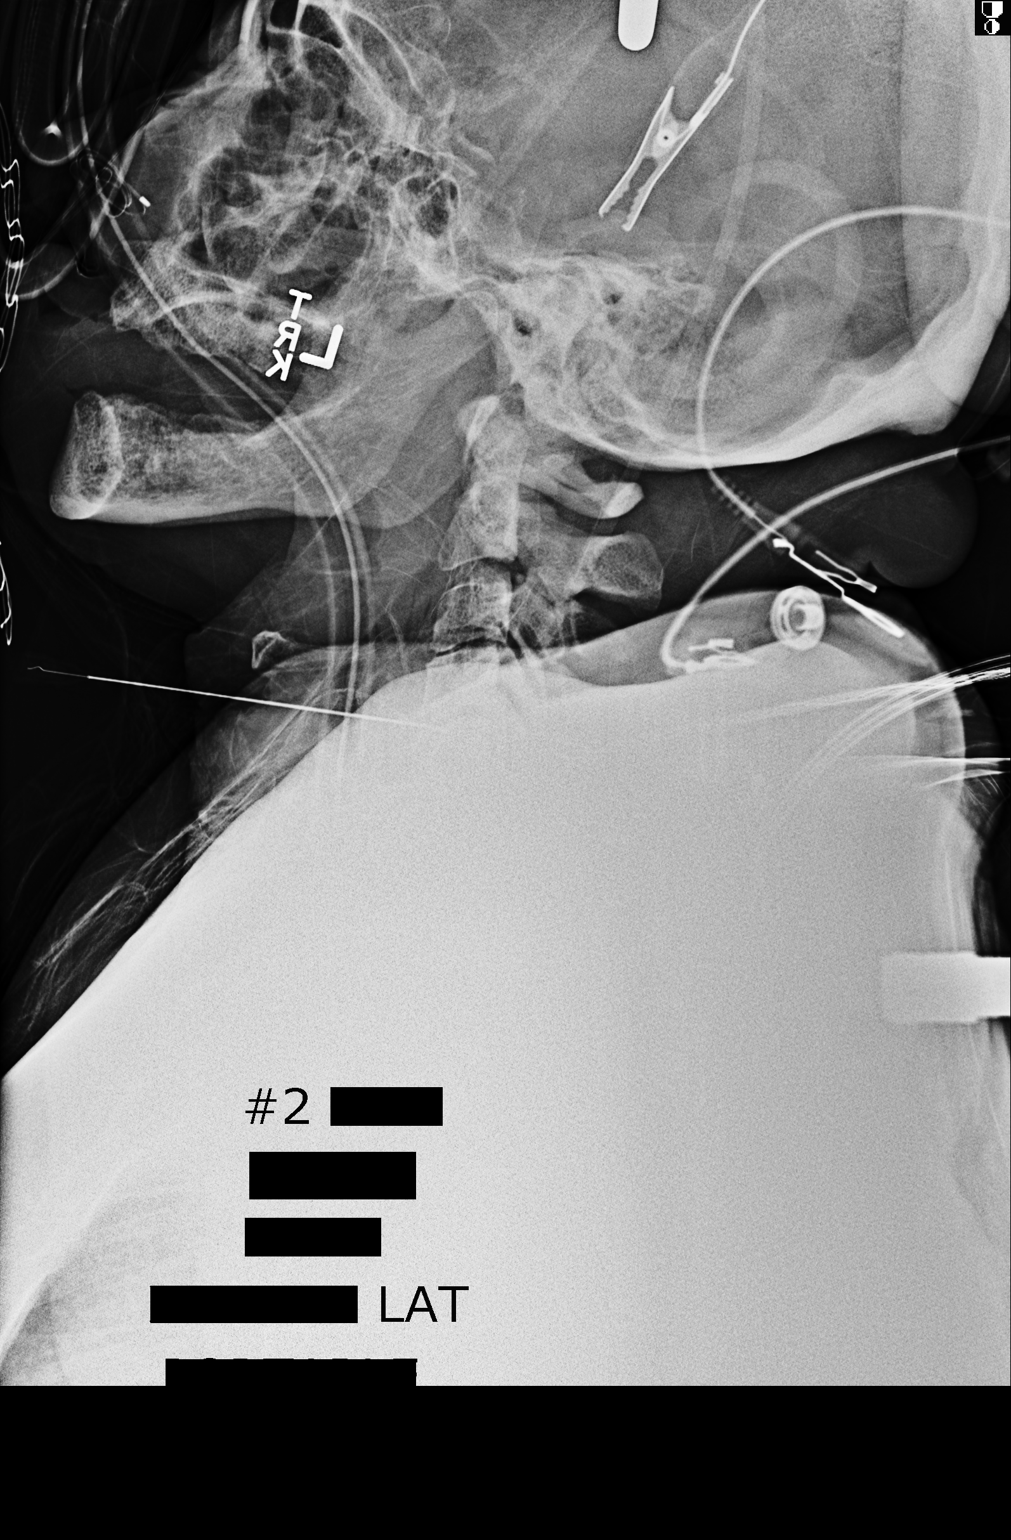

[2 of 2 positions shown; findings below may reference images not displayed]

FINDINGS: Initial images demonstrate a needle in the anterior aspect of the
C3-4 disc space. The second film demonstrates the same medially in
the anterior aspect of the C4-5 disc space. The third film shows
surgical retractors as well as a screw extending into the C4
vertebral body.
IMPRESSION: Intraoperative localization for cervical decompression.

## 2016-08-03 DIAGNOSIS — M9711XD 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, subsequent encounter: Secondary | ICD-10-CM | POA: Diagnosis not present

## 2016-08-03 DIAGNOSIS — I10 Essential (primary) hypertension: Secondary | ICD-10-CM | POA: Diagnosis not present

## 2016-08-03 DIAGNOSIS — R278 Other lack of coordination: Secondary | ICD-10-CM | POA: Diagnosis not present

## 2016-08-03 DIAGNOSIS — E785 Hyperlipidemia, unspecified: Secondary | ICD-10-CM | POA: Diagnosis not present

## 2016-08-03 DIAGNOSIS — K219 Gastro-esophageal reflux disease without esophagitis: Secondary | ICD-10-CM | POA: Diagnosis not present

## 2016-08-03 DIAGNOSIS — R2689 Other abnormalities of gait and mobility: Secondary | ICD-10-CM | POA: Diagnosis not present

## 2016-08-03 DIAGNOSIS — I5033 Acute on chronic diastolic (congestive) heart failure: Secondary | ICD-10-CM | POA: Diagnosis not present

## 2016-08-03 DIAGNOSIS — J449 Chronic obstructive pulmonary disease, unspecified: Secondary | ICD-10-CM | POA: Diagnosis not present

## 2016-08-03 DIAGNOSIS — E1122 Type 2 diabetes mellitus with diabetic chronic kidney disease: Secondary | ICD-10-CM | POA: Diagnosis not present

## 2016-08-06 DIAGNOSIS — R2689 Other abnormalities of gait and mobility: Secondary | ICD-10-CM | POA: Diagnosis not present

## 2016-08-06 DIAGNOSIS — E1122 Type 2 diabetes mellitus with diabetic chronic kidney disease: Secondary | ICD-10-CM | POA: Diagnosis not present

## 2016-08-06 DIAGNOSIS — K219 Gastro-esophageal reflux disease without esophagitis: Secondary | ICD-10-CM | POA: Diagnosis not present

## 2016-08-06 DIAGNOSIS — E785 Hyperlipidemia, unspecified: Secondary | ICD-10-CM | POA: Diagnosis not present

## 2016-08-06 DIAGNOSIS — I10 Essential (primary) hypertension: Secondary | ICD-10-CM | POA: Diagnosis not present

## 2016-08-06 DIAGNOSIS — M9711XD 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, subsequent encounter: Secondary | ICD-10-CM | POA: Diagnosis not present

## 2016-08-06 DIAGNOSIS — R278 Other lack of coordination: Secondary | ICD-10-CM | POA: Diagnosis not present

## 2016-08-06 DIAGNOSIS — I5033 Acute on chronic diastolic (congestive) heart failure: Secondary | ICD-10-CM | POA: Diagnosis not present

## 2016-08-06 DIAGNOSIS — J449 Chronic obstructive pulmonary disease, unspecified: Secondary | ICD-10-CM | POA: Diagnosis not present

## 2016-08-07 DIAGNOSIS — I5033 Acute on chronic diastolic (congestive) heart failure: Secondary | ICD-10-CM | POA: Diagnosis not present

## 2016-08-07 DIAGNOSIS — I10 Essential (primary) hypertension: Secondary | ICD-10-CM | POA: Diagnosis not present

## 2016-08-07 DIAGNOSIS — E785 Hyperlipidemia, unspecified: Secondary | ICD-10-CM | POA: Diagnosis not present

## 2016-08-07 DIAGNOSIS — K219 Gastro-esophageal reflux disease without esophagitis: Secondary | ICD-10-CM | POA: Diagnosis not present

## 2016-08-07 DIAGNOSIS — E1122 Type 2 diabetes mellitus with diabetic chronic kidney disease: Secondary | ICD-10-CM | POA: Diagnosis not present

## 2016-08-07 DIAGNOSIS — R278 Other lack of coordination: Secondary | ICD-10-CM | POA: Diagnosis not present

## 2016-08-07 DIAGNOSIS — R2689 Other abnormalities of gait and mobility: Secondary | ICD-10-CM | POA: Diagnosis not present

## 2016-08-07 DIAGNOSIS — J449 Chronic obstructive pulmonary disease, unspecified: Secondary | ICD-10-CM | POA: Diagnosis not present

## 2016-08-07 DIAGNOSIS — M9711XD 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, subsequent encounter: Secondary | ICD-10-CM | POA: Diagnosis not present

## 2016-08-08 DIAGNOSIS — E1122 Type 2 diabetes mellitus with diabetic chronic kidney disease: Secondary | ICD-10-CM | POA: Diagnosis not present

## 2016-08-08 DIAGNOSIS — M9711XD 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, subsequent encounter: Secondary | ICD-10-CM | POA: Diagnosis not present

## 2016-08-08 DIAGNOSIS — E785 Hyperlipidemia, unspecified: Secondary | ICD-10-CM | POA: Diagnosis not present

## 2016-08-08 DIAGNOSIS — J449 Chronic obstructive pulmonary disease, unspecified: Secondary | ICD-10-CM | POA: Diagnosis not present

## 2016-08-08 DIAGNOSIS — R2689 Other abnormalities of gait and mobility: Secondary | ICD-10-CM | POA: Diagnosis not present

## 2016-08-08 DIAGNOSIS — I5033 Acute on chronic diastolic (congestive) heart failure: Secondary | ICD-10-CM | POA: Diagnosis not present

## 2016-08-08 DIAGNOSIS — I10 Essential (primary) hypertension: Secondary | ICD-10-CM | POA: Diagnosis not present

## 2016-08-08 DIAGNOSIS — K219 Gastro-esophageal reflux disease without esophagitis: Secondary | ICD-10-CM | POA: Diagnosis not present

## 2016-08-08 DIAGNOSIS — R278 Other lack of coordination: Secondary | ICD-10-CM | POA: Diagnosis not present

## 2016-08-09 DIAGNOSIS — E1122 Type 2 diabetes mellitus with diabetic chronic kidney disease: Secondary | ICD-10-CM | POA: Diagnosis not present

## 2016-08-09 DIAGNOSIS — R2689 Other abnormalities of gait and mobility: Secondary | ICD-10-CM | POA: Diagnosis not present

## 2016-08-09 DIAGNOSIS — I10 Essential (primary) hypertension: Secondary | ICD-10-CM | POA: Diagnosis not present

## 2016-08-09 DIAGNOSIS — E785 Hyperlipidemia, unspecified: Secondary | ICD-10-CM | POA: Diagnosis not present

## 2016-08-09 DIAGNOSIS — I5033 Acute on chronic diastolic (congestive) heart failure: Secondary | ICD-10-CM | POA: Diagnosis not present

## 2016-08-09 DIAGNOSIS — R278 Other lack of coordination: Secondary | ICD-10-CM | POA: Diagnosis not present

## 2016-08-09 DIAGNOSIS — K219 Gastro-esophageal reflux disease without esophagitis: Secondary | ICD-10-CM | POA: Diagnosis not present

## 2016-08-09 DIAGNOSIS — M9711XD 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, subsequent encounter: Secondary | ICD-10-CM | POA: Diagnosis not present

## 2016-08-09 DIAGNOSIS — J449 Chronic obstructive pulmonary disease, unspecified: Secondary | ICD-10-CM | POA: Diagnosis not present

## 2016-08-10 ENCOUNTER — Encounter (INDEPENDENT_AMBULATORY_CARE_PROVIDER_SITE_OTHER): Payer: Self-pay | Admitting: Orthopaedic Surgery

## 2016-08-10 ENCOUNTER — Ambulatory Visit (INDEPENDENT_AMBULATORY_CARE_PROVIDER_SITE_OTHER): Payer: Medicare Other | Admitting: Orthopaedic Surgery

## 2016-08-10 ENCOUNTER — Ambulatory Visit (INDEPENDENT_AMBULATORY_CARE_PROVIDER_SITE_OTHER): Payer: Medicare Other

## 2016-08-10 VITALS — BP 119/79 | HR 95 | Ht 63.0 in | Wt 198.0 lb

## 2016-08-10 DIAGNOSIS — R278 Other lack of coordination: Secondary | ICD-10-CM | POA: Diagnosis not present

## 2016-08-10 DIAGNOSIS — M9711XD 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, subsequent encounter: Secondary | ICD-10-CM

## 2016-08-10 DIAGNOSIS — E1122 Type 2 diabetes mellitus with diabetic chronic kidney disease: Secondary | ICD-10-CM | POA: Diagnosis not present

## 2016-08-10 DIAGNOSIS — K219 Gastro-esophageal reflux disease without esophagitis: Secondary | ICD-10-CM | POA: Diagnosis not present

## 2016-08-10 DIAGNOSIS — R2689 Other abnormalities of gait and mobility: Secondary | ICD-10-CM | POA: Diagnosis not present

## 2016-08-10 DIAGNOSIS — I5033 Acute on chronic diastolic (congestive) heart failure: Secondary | ICD-10-CM | POA: Diagnosis not present

## 2016-08-10 DIAGNOSIS — I10 Essential (primary) hypertension: Secondary | ICD-10-CM | POA: Diagnosis not present

## 2016-08-10 DIAGNOSIS — M87051 Idiopathic aseptic necrosis of right femur: Secondary | ICD-10-CM

## 2016-08-10 DIAGNOSIS — J449 Chronic obstructive pulmonary disease, unspecified: Secondary | ICD-10-CM | POA: Diagnosis not present

## 2016-08-10 NOTE — Progress Notes (Signed)
Post-Op Visit Note   Patient: Debbie Mullins           Date of Birth: 23-Sep-1955           MRN: [id number] Visit Date: 08/10/2016 PCP: Lucianne Lei, MD   Assessment & Plan:  Chief Complaint:  Chief Complaint  Patient presents with  . Right Leg - Routine Post Op   Visit Diagnoses:  1. 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, subsequent encounter   2. Avascular necrosis of hip, right [facility])      Patient doing well. Making great progress at [facility]. Compliant with 50% weightbearing restriction.  Not having any pain. States that she wants to be discharged home.States that she has never had any problems with her right hip despite the severity of her DJD/AVN.    Plan: Continue working with PT. Start weightbearing as tolerated with walker. Must still be careful. Anticipate discharge home from the facility when she is stable and a low fall risk. We'll continue to monitor her right hip. She'll let us know if this starts being symptomatic especially when she begins weightbearing as tolerated. Voices understanding.  Follow-Up Instructions: Return in about 6 weeks (around 09/21/2016).   Orders:  Orders Placed This Encounter  Procedures  . XR FEMUR, MIN 2 VIEWS RIGHT   No orders of the defined types were placed in this encounter.   Imaging: Xr Femur, Min 2 Views Right  Result Date: 08/10/2016 X-ray right femur shows that  hardware is intact. Good healing at the fracture site. No complicating features.   PMFS History: Patient Active Problem List   Diagnosis Date Noted  . Hyperkalemia 05/24/2016  . Displaced supracondylar fracture without intracondylar extension of lower end of right femur, initial encounter for closed fracture ([place]) 05/23/2016  . 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 Closed fracture of right distal femur ([place])   . Osteoarthritis of spine with myelopathy, cervical region 11/17/2014  . Failed  total left knee replacement ([place]) 08/10/2013  . Pulmonary nodules/lesions, multiple 08/09/2013  . HLD (hyperlipidemia) 01/22/2012  . Pulmonary hypertension ([place]) 01/08/2012  . Acute on chronic diastolic heart failure ([place]) 01/05/2012  . Acute respiratory failure with hypoxia ([place]) 01/03/2012  . Pulmonary edema, acute ([place]) 01/03/2012  . 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 HTN (hypertension) 01/02/2012  . COPD with acute exacerbation ([place]) 01/02/2012  . Tobacco abuse 07/04/2011  . 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 Leukocytosis 02/09/2011  . Normocytic anemia 02/09/2011  . Mild Thrombocytosis 02/09/2011  . COPD (chronic obstructive pulmonary disease) ([place]) 02/09/2011  . Obesity 02/09/2011   Past Medical History:  Diagnosis Date  . Anemia   . Asthma   . CHF (congestive heart failure) (HCC)    takes Furosemide daily   . COPD (chronic obstructive pulmonary disease) (HCC)    Albuterol inhaler prn;SIngulair at night  . Depression    takes Wellbutrin daily  . Diabetes mellitus    takes Metformin daily  . GERD (gastroesophageal reflux disease)    takes Nexium daily  . History of colon polyps    benign  . Hypertension    takes Lisinopril daily  . Joint pain   . Joint swelling   . Leukocytosis 02/09/2011  . Neck pain    HNP  . Normocytic anemia 02/09/2011  . Peripheral edema    takes Furosemide daily  . Pneumonia    many yrs ago  . Pulmonary nodules/lesions, multiple  08/09/2013  . Shortness of breath dyspnea    with exertion  . Thrombocytosis ([place]) 02/09/2011  . Urinary frequency   . Urinary urgency   . Weakness    numbness and tingling in both hands    Family History  Problem Relation Age of Onset  . Heart failure Mother   . Cancer Maternal Grandmother     Past Surgical History:  Procedure Laterality Date  . ANTERIOR CERVICAL DECOMP/DISCECTOMY FUSION N/A 11/17/2014   Procedure: Cervical four- cervical five Anterior Cervical Decompression with fusion  and bonegraft;  Surgeon: Ashok Pall, MD;  Location: [place] NEUR[facility] ORS;  Service: Neurosurgery;  Laterality: N/A;  C45 anterior cervical decompression with fusion plating and bonegraft  . COLONOSCOPY    . FEMUR IM NAIL Right 05/23/2016  . FEMUR IM NAIL Right 05/23/2016   Procedure: INTRAMEDULLARY (IM) RETROGRADE FEMORAL NAILING;  Surgeon: [name], MD;  Location: [place];  Service: Orthopedics;  Laterality: Right;  . KNEE ARTHROPLASTY Left 08/10/2013   Procedure:  TOTAL KNEE ARTHROPLASTY;  Surgeon: [name], MD;  Location: [place];  Service: Orthopedics;  Laterality: Left;  Left Total Knee Arthroplasty Revision, Cemented, Semi-constrained,   . LEFT AND RIGHT HEART CATHETERIZATION WITH CORONARY ANGIOGRAM N/A 01/07/2012   Procedure: LEFT AND RIGHT HEART CATHETERIZATION WITH CORONARY ANGIOGRAM;  Surgeon: Thayer Headings, MD;  Location: [facility] CATH LAB;  Service: Cardiovascular;  Laterality: N/A;  . TONSILLECTOMY     as a child  . TOTAL KNEE ARTHROPLASTY Bilateral    Social History   Occupational History  . Not on file.   Social History Main Topics  . Smoking status: Current Every Day Smoker    Packs/day: 0.50    Years: 41.00    Types: Cigarettes  . Smokeless tobacco: Never Used  . Alcohol use Yes     Comment: wine  . Drug use: No  . Sexual activity: No   Exam Very pleasant female alert and oriented in no acute distress. Right lower extremity wound has healed very nicely. No signs of infection. Distal femur and knee nontender. Nontender. Neurovascular intact. Right knee range of motion 0-90.

## 2016-08-13 DIAGNOSIS — I5033 Acute on chronic diastolic (congestive) heart failure: Secondary | ICD-10-CM | POA: Diagnosis not present

## 2016-08-13 DIAGNOSIS — R278 Other lack of coordination: Secondary | ICD-10-CM | POA: Diagnosis not present

## 2016-08-13 DIAGNOSIS — M9711XD 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, subsequent encounter: Secondary | ICD-10-CM | POA: Diagnosis not present

## 2016-08-13 DIAGNOSIS — I10 Essential (primary) hypertension: Secondary | ICD-10-CM | POA: Diagnosis not present

## 2016-08-13 DIAGNOSIS — R2689 Other abnormalities of gait and mobility: Secondary | ICD-10-CM | POA: Diagnosis not present

## 2016-08-13 DIAGNOSIS — E1122 Type 2 diabetes mellitus with diabetic chronic kidney disease: Secondary | ICD-10-CM | POA: Diagnosis not present

## 2016-08-13 DIAGNOSIS — J449 Chronic obstructive pulmonary disease, unspecified: Secondary | ICD-10-CM | POA: Diagnosis not present

## 2016-08-13 DIAGNOSIS — K219 Gastro-esophageal reflux disease without esophagitis: Secondary | ICD-10-CM | POA: Diagnosis not present

## 2016-08-14 DIAGNOSIS — R278 Other lack of coordination: Secondary | ICD-10-CM | POA: Diagnosis not present

## 2016-08-14 DIAGNOSIS — E1122 Type 2 diabetes mellitus with diabetic chronic kidney disease: Secondary | ICD-10-CM | POA: Diagnosis not present

## 2016-08-14 DIAGNOSIS — M9711XD 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, subsequent encounter: Secondary | ICD-10-CM | POA: Diagnosis not present

## 2016-08-14 DIAGNOSIS — K219 Gastro-esophageal reflux disease without esophagitis: Secondary | ICD-10-CM | POA: Diagnosis not present

## 2016-08-14 DIAGNOSIS — I5033 Acute on chronic diastolic (congestive) heart failure: Secondary | ICD-10-CM | POA: Diagnosis not present

## 2016-08-14 DIAGNOSIS — J449 Chronic obstructive pulmonary disease, unspecified: Secondary | ICD-10-CM | POA: Diagnosis not present

## 2016-08-14 DIAGNOSIS — E119 Type 2 diabetes mellitus without complications: Secondary | ICD-10-CM | POA: Diagnosis not present

## 2016-08-14 DIAGNOSIS — R2689 Other abnormalities of gait and mobility: Secondary | ICD-10-CM | POA: Diagnosis not present

## 2016-08-14 DIAGNOSIS — I10 Essential (primary) hypertension: Secondary | ICD-10-CM | POA: Diagnosis not present

## 2016-08-15 DIAGNOSIS — I5033 Acute on chronic diastolic (congestive) heart failure: Secondary | ICD-10-CM | POA: Diagnosis not present

## 2016-08-15 DIAGNOSIS — R278 Other lack of coordination: Secondary | ICD-10-CM | POA: Diagnosis not present

## 2016-08-15 DIAGNOSIS — I10 Essential (primary) hypertension: Secondary | ICD-10-CM | POA: Diagnosis not present

## 2016-08-15 DIAGNOSIS — R2689 Other abnormalities of gait and mobility: Secondary | ICD-10-CM | POA: Diagnosis not present

## 2016-08-15 DIAGNOSIS — J449 Chronic obstructive pulmonary disease, unspecified: Secondary | ICD-10-CM | POA: Diagnosis not present

## 2016-08-15 DIAGNOSIS — K219 Gastro-esophageal reflux disease without esophagitis: Secondary | ICD-10-CM | POA: Diagnosis not present

## 2016-08-15 DIAGNOSIS — E1122 Type 2 diabetes mellitus with diabetic chronic kidney disease: Secondary | ICD-10-CM | POA: Diagnosis not present

## 2016-08-15 DIAGNOSIS — M9711XD 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, subsequent encounter: Secondary | ICD-10-CM | POA: Diagnosis not present

## 2016-08-16 DIAGNOSIS — I10 Essential (primary) hypertension: Secondary | ICD-10-CM | POA: Diagnosis not present

## 2016-08-16 DIAGNOSIS — I5033 Acute on chronic diastolic (congestive) heart failure: Secondary | ICD-10-CM | POA: Diagnosis not present

## 2016-08-16 DIAGNOSIS — R2689 Other abnormalities of gait and mobility: Secondary | ICD-10-CM | POA: Diagnosis not present

## 2016-08-16 DIAGNOSIS — K219 Gastro-esophageal reflux disease without esophagitis: Secondary | ICD-10-CM | POA: Diagnosis not present

## 2016-08-16 DIAGNOSIS — M9711XD 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, subsequent encounter: Secondary | ICD-10-CM | POA: Diagnosis not present

## 2016-08-16 DIAGNOSIS — R278 Other lack of coordination: Secondary | ICD-10-CM | POA: Diagnosis not present

## 2016-08-16 DIAGNOSIS — E1122 Type 2 diabetes mellitus with diabetic chronic kidney disease: Secondary | ICD-10-CM | POA: Diagnosis not present

## 2016-08-16 DIAGNOSIS — J449 Chronic obstructive pulmonary disease, unspecified: Secondary | ICD-10-CM | POA: Diagnosis not present

## 2016-08-17 DIAGNOSIS — R2689 Other abnormalities of gait and mobility: Secondary | ICD-10-CM | POA: Diagnosis not present

## 2016-08-17 DIAGNOSIS — M9711XD 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, subsequent encounter: Secondary | ICD-10-CM | POA: Diagnosis not present

## 2016-08-17 DIAGNOSIS — E1122 Type 2 diabetes mellitus with diabetic chronic kidney disease: Secondary | ICD-10-CM | POA: Diagnosis not present

## 2016-08-17 DIAGNOSIS — K219 Gastro-esophageal reflux disease without esophagitis: Secondary | ICD-10-CM | POA: Diagnosis not present

## 2016-08-17 DIAGNOSIS — J449 Chronic obstructive pulmonary disease, unspecified: Secondary | ICD-10-CM | POA: Diagnosis not present

## 2016-08-17 DIAGNOSIS — I10 Essential (primary) hypertension: Secondary | ICD-10-CM | POA: Diagnosis not present

## 2016-08-17 DIAGNOSIS — R278 Other lack of coordination: Secondary | ICD-10-CM | POA: Diagnosis not present

## 2016-08-17 DIAGNOSIS — I5033 Acute on chronic diastolic (congestive) heart failure: Secondary | ICD-10-CM | POA: Diagnosis not present

## 2016-08-20 DIAGNOSIS — I10 Essential (primary) hypertension: Secondary | ICD-10-CM | POA: Diagnosis not present

## 2016-08-20 DIAGNOSIS — R2689 Other abnormalities of gait and mobility: Secondary | ICD-10-CM | POA: Diagnosis not present

## 2016-08-20 DIAGNOSIS — E1122 Type 2 diabetes mellitus with diabetic chronic kidney disease: Secondary | ICD-10-CM | POA: Diagnosis not present

## 2016-08-20 DIAGNOSIS — M9711XD 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, subsequent encounter: Secondary | ICD-10-CM | POA: Diagnosis not present

## 2016-08-20 DIAGNOSIS — J449 Chronic obstructive pulmonary disease, unspecified: Secondary | ICD-10-CM | POA: Diagnosis not present

## 2016-08-20 DIAGNOSIS — I5033 Acute on chronic diastolic (congestive) heart failure: Secondary | ICD-10-CM | POA: Diagnosis not present

## 2016-08-20 DIAGNOSIS — K219 Gastro-esophageal reflux disease without esophagitis: Secondary | ICD-10-CM | POA: Diagnosis not present

## 2016-08-20 DIAGNOSIS — R278 Other lack of coordination: Secondary | ICD-10-CM | POA: Diagnosis not present

## 2016-08-21 DIAGNOSIS — R2689 Other abnormalities of gait and mobility: Secondary | ICD-10-CM | POA: Diagnosis not present

## 2016-08-21 DIAGNOSIS — I10 Essential (primary) hypertension: Secondary | ICD-10-CM | POA: Diagnosis not present

## 2016-08-21 DIAGNOSIS — R278 Other lack of coordination: Secondary | ICD-10-CM | POA: Diagnosis not present

## 2016-08-21 DIAGNOSIS — K219 Gastro-esophageal reflux disease without esophagitis: Secondary | ICD-10-CM | POA: Diagnosis not present

## 2016-08-21 DIAGNOSIS — M9711XD 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, subsequent encounter: Secondary | ICD-10-CM | POA: Diagnosis not present

## 2016-08-21 DIAGNOSIS — I5033 Acute on chronic diastolic (congestive) heart failure: Secondary | ICD-10-CM | POA: Diagnosis not present

## 2016-08-21 DIAGNOSIS — E1122 Type 2 diabetes mellitus with diabetic chronic kidney disease: Secondary | ICD-10-CM | POA: Diagnosis not present

## 2016-08-21 DIAGNOSIS — J449 Chronic obstructive pulmonary disease, unspecified: Secondary | ICD-10-CM | POA: Diagnosis not present

## 2016-08-22 DIAGNOSIS — R278 Other lack of coordination: Secondary | ICD-10-CM | POA: Diagnosis not present

## 2016-08-22 DIAGNOSIS — J449 Chronic obstructive pulmonary disease, unspecified: Secondary | ICD-10-CM | POA: Diagnosis not present

## 2016-08-22 DIAGNOSIS — M9711XD 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, subsequent encounter: Secondary | ICD-10-CM | POA: Diagnosis not present

## 2016-08-22 DIAGNOSIS — R2689 Other abnormalities of gait and mobility: Secondary | ICD-10-CM | POA: Diagnosis not present

## 2016-08-22 DIAGNOSIS — E1122 Type 2 diabetes mellitus with diabetic chronic kidney disease: Secondary | ICD-10-CM | POA: Diagnosis not present

## 2016-08-22 DIAGNOSIS — I5033 Acute on chronic diastolic (congestive) heart failure: Secondary | ICD-10-CM | POA: Diagnosis not present

## 2016-08-22 DIAGNOSIS — I10 Essential (primary) hypertension: Secondary | ICD-10-CM | POA: Diagnosis not present

## 2016-08-22 DIAGNOSIS — K219 Gastro-esophageal reflux disease without esophagitis: Secondary | ICD-10-CM | POA: Diagnosis not present

## 2016-08-23 DIAGNOSIS — I10 Essential (primary) hypertension: Secondary | ICD-10-CM | POA: Diagnosis not present

## 2016-08-23 DIAGNOSIS — R2689 Other abnormalities of gait and mobility: Secondary | ICD-10-CM | POA: Diagnosis not present

## 2016-08-23 DIAGNOSIS — M9711XD 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, subsequent encounter: Secondary | ICD-10-CM | POA: Diagnosis not present

## 2016-08-23 DIAGNOSIS — K219 Gastro-esophageal reflux disease without esophagitis: Secondary | ICD-10-CM | POA: Diagnosis not present

## 2016-08-23 DIAGNOSIS — I5033 Acute on chronic diastolic (congestive) heart failure: Secondary | ICD-10-CM | POA: Diagnosis not present

## 2016-08-23 DIAGNOSIS — J449 Chronic obstructive pulmonary disease, unspecified: Secondary | ICD-10-CM | POA: Diagnosis not present

## 2016-08-23 DIAGNOSIS — E1122 Type 2 diabetes mellitus with diabetic chronic kidney disease: Secondary | ICD-10-CM | POA: Diagnosis not present

## 2016-08-23 DIAGNOSIS — R278 Other lack of coordination: Secondary | ICD-10-CM | POA: Diagnosis not present

## 2016-08-24 DIAGNOSIS — K219 Gastro-esophageal reflux disease without esophagitis: Secondary | ICD-10-CM | POA: Diagnosis not present

## 2016-08-24 DIAGNOSIS — J449 Chronic obstructive pulmonary disease, unspecified: Secondary | ICD-10-CM | POA: Diagnosis not present

## 2016-08-24 DIAGNOSIS — E1122 Type 2 diabetes mellitus with diabetic chronic kidney disease: Secondary | ICD-10-CM | POA: Diagnosis not present

## 2016-08-24 DIAGNOSIS — R2689 Other abnormalities of gait and mobility: Secondary | ICD-10-CM | POA: Diagnosis not present

## 2016-08-24 DIAGNOSIS — I10 Essential (primary) hypertension: Secondary | ICD-10-CM | POA: Diagnosis not present

## 2016-08-24 DIAGNOSIS — R278 Other lack of coordination: Secondary | ICD-10-CM | POA: Diagnosis not present

## 2016-08-24 DIAGNOSIS — I5033 Acute on chronic diastolic (congestive) heart failure: Secondary | ICD-10-CM | POA: Diagnosis not present

## 2016-08-24 DIAGNOSIS — M9711XD 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, subsequent encounter: Secondary | ICD-10-CM | POA: Diagnosis not present

## 2016-08-27 DIAGNOSIS — K219 Gastro-esophageal reflux disease without esophagitis: Secondary | ICD-10-CM | POA: Diagnosis not present

## 2016-08-27 DIAGNOSIS — R2689 Other abnormalities of gait and mobility: Secondary | ICD-10-CM | POA: Diagnosis not present

## 2016-08-27 DIAGNOSIS — E1122 Type 2 diabetes mellitus with diabetic chronic kidney disease: Secondary | ICD-10-CM | POA: Diagnosis not present

## 2016-08-27 DIAGNOSIS — I10 Essential (primary) hypertension: Secondary | ICD-10-CM | POA: Diagnosis not present

## 2016-08-27 DIAGNOSIS — R278 Other lack of coordination: Secondary | ICD-10-CM | POA: Diagnosis not present

## 2016-08-27 DIAGNOSIS — J449 Chronic obstructive pulmonary disease, unspecified: Secondary | ICD-10-CM | POA: Diagnosis not present

## 2016-08-27 DIAGNOSIS — M9711XD 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, subsequent encounter: Secondary | ICD-10-CM | POA: Diagnosis not present

## 2016-08-27 DIAGNOSIS — I5033 Acute on chronic diastolic (congestive) heart failure: Secondary | ICD-10-CM | POA: Diagnosis not present

## 2016-08-28 DIAGNOSIS — I5033 Acute on chronic diastolic (congestive) heart failure: Secondary | ICD-10-CM | POA: Diagnosis not present

## 2016-08-28 DIAGNOSIS — E1122 Type 2 diabetes mellitus with diabetic chronic kidney disease: Secondary | ICD-10-CM | POA: Diagnosis not present

## 2016-08-28 DIAGNOSIS — R2689 Other abnormalities of gait and mobility: Secondary | ICD-10-CM | POA: Diagnosis not present

## 2016-08-28 DIAGNOSIS — K219 Gastro-esophageal reflux disease without esophagitis: Secondary | ICD-10-CM | POA: Diagnosis not present

## 2016-08-28 DIAGNOSIS — M9711XD 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, subsequent encounter: Secondary | ICD-10-CM | POA: Diagnosis not present

## 2016-08-28 DIAGNOSIS — R278 Other lack of coordination: Secondary | ICD-10-CM | POA: Diagnosis not present

## 2016-08-28 DIAGNOSIS — I10 Essential (primary) hypertension: Secondary | ICD-10-CM | POA: Diagnosis not present

## 2016-08-28 DIAGNOSIS — J449 Chronic obstructive pulmonary disease, unspecified: Secondary | ICD-10-CM | POA: Diagnosis not present

## 2016-08-29 DIAGNOSIS — I5033 Acute on chronic diastolic (congestive) heart failure: Secondary | ICD-10-CM | POA: Diagnosis not present

## 2016-08-29 DIAGNOSIS — J449 Chronic obstructive pulmonary disease, unspecified: Secondary | ICD-10-CM | POA: Diagnosis not present

## 2016-08-29 DIAGNOSIS — I10 Essential (primary) hypertension: Secondary | ICD-10-CM | POA: Diagnosis not present

## 2016-08-29 DIAGNOSIS — R278 Other lack of coordination: Secondary | ICD-10-CM | POA: Diagnosis not present

## 2016-08-29 DIAGNOSIS — E1122 Type 2 diabetes mellitus with diabetic chronic kidney disease: Secondary | ICD-10-CM | POA: Diagnosis not present

## 2016-08-29 DIAGNOSIS — R2689 Other abnormalities of gait and mobility: Secondary | ICD-10-CM | POA: Diagnosis not present

## 2016-08-29 DIAGNOSIS — M9711XD 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, subsequent encounter: Secondary | ICD-10-CM | POA: Diagnosis not present

## 2016-08-29 DIAGNOSIS — K219 Gastro-esophageal reflux disease without esophagitis: Secondary | ICD-10-CM | POA: Diagnosis not present

## 2016-08-30 DIAGNOSIS — R2689 Other abnormalities of gait and mobility: Secondary | ICD-10-CM | POA: Diagnosis not present

## 2016-08-30 DIAGNOSIS — M9711XD 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, subsequent encounter: Secondary | ICD-10-CM | POA: Diagnosis not present

## 2016-08-30 DIAGNOSIS — E1122 Type 2 diabetes mellitus with diabetic chronic kidney disease: Secondary | ICD-10-CM | POA: Diagnosis not present

## 2016-08-30 DIAGNOSIS — R278 Other lack of coordination: Secondary | ICD-10-CM | POA: Diagnosis not present

## 2016-08-30 DIAGNOSIS — K219 Gastro-esophageal reflux disease without esophagitis: Secondary | ICD-10-CM | POA: Diagnosis not present

## 2016-08-30 DIAGNOSIS — I10 Essential (primary) hypertension: Secondary | ICD-10-CM | POA: Diagnosis not present

## 2016-08-30 DIAGNOSIS — I5033 Acute on chronic diastolic (congestive) heart failure: Secondary | ICD-10-CM | POA: Diagnosis not present

## 2016-08-30 DIAGNOSIS — J449 Chronic obstructive pulmonary disease, unspecified: Secondary | ICD-10-CM | POA: Diagnosis not present

## 2016-08-31 DIAGNOSIS — R278 Other lack of coordination: Secondary | ICD-10-CM | POA: Diagnosis not present

## 2016-08-31 DIAGNOSIS — E119 Type 2 diabetes mellitus without complications: Secondary | ICD-10-CM | POA: Diagnosis not present

## 2016-08-31 DIAGNOSIS — R2689 Other abnormalities of gait and mobility: Secondary | ICD-10-CM | POA: Diagnosis not present

## 2016-08-31 DIAGNOSIS — J449 Chronic obstructive pulmonary disease, unspecified: Secondary | ICD-10-CM | POA: Diagnosis not present

## 2016-08-31 DIAGNOSIS — I5033 Acute on chronic diastolic (congestive) heart failure: Secondary | ICD-10-CM | POA: Diagnosis not present

## 2016-08-31 DIAGNOSIS — M9711XD 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, subsequent encounter: Secondary | ICD-10-CM | POA: Diagnosis not present

## 2016-08-31 DIAGNOSIS — I509 Heart failure, unspecified: Secondary | ICD-10-CM | POA: Diagnosis not present

## 2016-08-31 DIAGNOSIS — K219 Gastro-esophageal reflux disease without esophagitis: Secondary | ICD-10-CM | POA: Diagnosis not present

## 2016-08-31 DIAGNOSIS — I10 Essential (primary) hypertension: Secondary | ICD-10-CM | POA: Diagnosis not present

## 2016-08-31 DIAGNOSIS — E1122 Type 2 diabetes mellitus with diabetic chronic kidney disease: Secondary | ICD-10-CM | POA: Diagnosis not present

## 2016-09-02 DIAGNOSIS — I5033 Acute on chronic diastolic (congestive) heart failure: Secondary | ICD-10-CM | POA: Diagnosis not present

## 2016-09-02 DIAGNOSIS — I110 Hypertensive heart disease with heart failure: Secondary | ICD-10-CM | POA: Diagnosis not present

## 2016-09-02 DIAGNOSIS — M9711XD 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, subsequent encounter: Secondary | ICD-10-CM | POA: Diagnosis not present

## 2016-09-02 DIAGNOSIS — M4712 Other spondylosis with myelopathy, cervical region: Secondary | ICD-10-CM | POA: Diagnosis not present

## 2016-09-02 DIAGNOSIS — E1122 Type 2 diabetes mellitus with diabetic chronic kidney disease: Secondary | ICD-10-CM | POA: Diagnosis not present

## 2016-09-02 DIAGNOSIS — R918 Other nonspecific abnormal finding of lung field: Secondary | ICD-10-CM | POA: Diagnosis not present

## 2016-09-02 DIAGNOSIS — J449 Chronic obstructive pulmonary disease, unspecified: Secondary | ICD-10-CM | POA: Diagnosis not present

## 2016-09-02 DIAGNOSIS — N181 Chronic kidney disease, stage 1: Secondary | ICD-10-CM | POA: Diagnosis not present

## 2016-09-02 DIAGNOSIS — I272 Pulmonary hypertension, unspecified: Secondary | ICD-10-CM | POA: Diagnosis not present

## 2016-09-04 DIAGNOSIS — M4712 Other spondylosis with myelopathy, cervical region: Secondary | ICD-10-CM | POA: Diagnosis not present

## 2016-09-04 DIAGNOSIS — J449 Chronic obstructive pulmonary disease, unspecified: Secondary | ICD-10-CM | POA: Diagnosis not present

## 2016-09-04 DIAGNOSIS — N181 Chronic kidney disease, stage 1: Secondary | ICD-10-CM | POA: Diagnosis not present

## 2016-09-04 DIAGNOSIS — R918 Other nonspecific abnormal finding of lung field: Secondary | ICD-10-CM | POA: Diagnosis not present

## 2016-09-04 DIAGNOSIS — I110 Hypertensive heart disease with heart failure: Secondary | ICD-10-CM | POA: Diagnosis not present

## 2016-09-04 DIAGNOSIS — E1122 Type 2 diabetes mellitus with diabetic chronic kidney disease: Secondary | ICD-10-CM | POA: Diagnosis not present

## 2016-09-04 DIAGNOSIS — I272 Pulmonary hypertension, unspecified: Secondary | ICD-10-CM | POA: Diagnosis not present

## 2016-09-04 DIAGNOSIS — M9711XD 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, subsequent encounter: Secondary | ICD-10-CM | POA: Diagnosis not present

## 2016-09-04 DIAGNOSIS — I5033 Acute on chronic diastolic (congestive) heart failure: Secondary | ICD-10-CM | POA: Diagnosis not present

## 2016-09-05 DIAGNOSIS — M87351 Other secondary osteonecrosis, right femur: Secondary | ICD-10-CM | POA: Diagnosis not present

## 2016-09-05 DIAGNOSIS — I10 Essential (primary) hypertension: Secondary | ICD-10-CM | POA: Diagnosis not present

## 2016-09-05 DIAGNOSIS — E785 Hyperlipidemia, unspecified: Secondary | ICD-10-CM | POA: Diagnosis not present

## 2016-09-05 DIAGNOSIS — E119 Type 2 diabetes mellitus without complications: Secondary | ICD-10-CM | POA: Diagnosis not present

## 2016-09-06 DIAGNOSIS — R918 Other nonspecific abnormal finding of lung field: Secondary | ICD-10-CM | POA: Diagnosis not present

## 2016-09-06 DIAGNOSIS — I5033 Acute on chronic diastolic (congestive) heart failure: Secondary | ICD-10-CM | POA: Diagnosis not present

## 2016-09-06 DIAGNOSIS — J449 Chronic obstructive pulmonary disease, unspecified: Secondary | ICD-10-CM | POA: Diagnosis not present

## 2016-09-06 DIAGNOSIS — N181 Chronic kidney disease, stage 1: Secondary | ICD-10-CM | POA: Diagnosis not present

## 2016-09-06 DIAGNOSIS — M4712 Other spondylosis with myelopathy, cervical region: Secondary | ICD-10-CM | POA: Diagnosis not present

## 2016-09-06 DIAGNOSIS — E1122 Type 2 diabetes mellitus with diabetic chronic kidney disease: Secondary | ICD-10-CM | POA: Diagnosis not present

## 2016-09-06 DIAGNOSIS — I110 Hypertensive heart disease with heart failure: Secondary | ICD-10-CM | POA: Diagnosis not present

## 2016-09-06 DIAGNOSIS — M9711XD 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, subsequent encounter: Secondary | ICD-10-CM | POA: Diagnosis not present

## 2016-09-06 DIAGNOSIS — I272 Pulmonary hypertension, unspecified: Secondary | ICD-10-CM | POA: Diagnosis not present

## 2016-09-11 DIAGNOSIS — J449 Chronic obstructive pulmonary disease, unspecified: Secondary | ICD-10-CM | POA: Diagnosis not present

## 2016-09-11 DIAGNOSIS — I5033 Acute on chronic diastolic (congestive) heart failure: Secondary | ICD-10-CM | POA: Diagnosis not present

## 2016-09-11 DIAGNOSIS — M9711XD 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, subsequent encounter: Secondary | ICD-10-CM | POA: Diagnosis not present

## 2016-09-11 DIAGNOSIS — M4712 Other spondylosis with myelopathy, cervical region: Secondary | ICD-10-CM | POA: Diagnosis not present

## 2016-09-11 DIAGNOSIS — R918 Other nonspecific abnormal finding of lung field: Secondary | ICD-10-CM | POA: Diagnosis not present

## 2016-09-11 DIAGNOSIS — I272 Pulmonary hypertension, unspecified: Secondary | ICD-10-CM | POA: Diagnosis not present

## 2016-09-11 DIAGNOSIS — N181 Chronic kidney disease, stage 1: Secondary | ICD-10-CM | POA: Diagnosis not present

## 2016-09-11 DIAGNOSIS — E1122 Type 2 diabetes mellitus with diabetic chronic kidney disease: Secondary | ICD-10-CM | POA: Diagnosis not present

## 2016-09-11 DIAGNOSIS — I110 Hypertensive heart disease with heart failure: Secondary | ICD-10-CM | POA: Diagnosis not present

## 2016-09-13 ENCOUNTER — Telehealth (INDEPENDENT_AMBULATORY_CARE_PROVIDER_SITE_OTHER): Payer: Self-pay

## 2016-09-13 DIAGNOSIS — M4712 Other spondylosis with myelopathy, cervical region: Secondary | ICD-10-CM | POA: Diagnosis not present

## 2016-09-13 DIAGNOSIS — I5033 Acute on chronic diastolic (congestive) heart failure: Secondary | ICD-10-CM | POA: Diagnosis not present

## 2016-09-13 DIAGNOSIS — N181 Chronic kidney disease, stage 1: Secondary | ICD-10-CM | POA: Diagnosis not present

## 2016-09-13 DIAGNOSIS — J449 Chronic obstructive pulmonary disease, unspecified: Secondary | ICD-10-CM | POA: Diagnosis not present

## 2016-09-13 DIAGNOSIS — I272 Pulmonary hypertension, unspecified: Secondary | ICD-10-CM | POA: Diagnosis not present

## 2016-09-13 DIAGNOSIS — E1122 Type 2 diabetes mellitus with diabetic chronic kidney disease: Secondary | ICD-10-CM | POA: Diagnosis not present

## 2016-09-13 DIAGNOSIS — I110 Hypertensive heart disease with heart failure: Secondary | ICD-10-CM | POA: Diagnosis not present

## 2016-09-13 DIAGNOSIS — M9711XD 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, subsequent encounter: Secondary | ICD-10-CM | POA: Diagnosis not present

## 2016-09-13 DIAGNOSIS — R918 Other nonspecific abnormal finding of lung field: Secondary | ICD-10-CM | POA: Diagnosis not present

## 2016-09-13 NOTE — Telephone Encounter (Signed)
Called, no answer. LMVM giving verbal

## 2016-09-13 NOTE — Telephone Encounter (Signed)
Mallory with [facility] would like verbal orders for [facility] for 1 x week for 1 week and  2 x week for 4 weeks

## 2016-09-18 ENCOUNTER — Telehealth (INDEPENDENT_AMBULATORY_CARE_PROVIDER_SITE_OTHER): Payer: Self-pay | Admitting: Orthopaedic Surgery

## 2016-09-18 DIAGNOSIS — N181 Chronic kidney disease, stage 1: Secondary | ICD-10-CM | POA: Diagnosis not present

## 2016-09-18 DIAGNOSIS — M4712 Other spondylosis with myelopathy, cervical region: Secondary | ICD-10-CM | POA: Diagnosis not present

## 2016-09-18 DIAGNOSIS — J449 Chronic obstructive pulmonary disease, unspecified: Secondary | ICD-10-CM | POA: Diagnosis not present

## 2016-09-18 DIAGNOSIS — I272 Pulmonary hypertension, unspecified: Secondary | ICD-10-CM | POA: Diagnosis not present

## 2016-09-18 DIAGNOSIS — E1122 Type 2 diabetes mellitus with diabetic chronic kidney disease: Secondary | ICD-10-CM | POA: Diagnosis not present

## 2016-09-18 DIAGNOSIS — R918 Other nonspecific abnormal finding of lung field: Secondary | ICD-10-CM | POA: Diagnosis not present

## 2016-09-18 DIAGNOSIS — I5033 Acute on chronic diastolic (congestive) heart failure: Secondary | ICD-10-CM | POA: Diagnosis not present

## 2016-09-18 DIAGNOSIS — M9711XD 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, subsequent encounter: Secondary | ICD-10-CM | POA: Diagnosis not present

## 2016-09-18 DIAGNOSIS — I110 Hypertensive heart disease with heart failure: Secondary | ICD-10-CM | POA: Diagnosis not present

## 2016-09-18 NOTE — Telephone Encounter (Signed)
Marlorie (PT) called needing verbal orders before she can see the patient.  Marlorie states she left previous message. The number to contact Meliton Rattan is [phone].

## 2016-09-19 DIAGNOSIS — M4712 Other spondylosis with myelopathy, cervical region: Secondary | ICD-10-CM | POA: Diagnosis not present

## 2016-09-19 DIAGNOSIS — J449 Chronic obstructive pulmonary disease, unspecified: Secondary | ICD-10-CM | POA: Diagnosis not present

## 2016-09-19 DIAGNOSIS — I5033 Acute on chronic diastolic (congestive) heart failure: Secondary | ICD-10-CM | POA: Diagnosis not present

## 2016-09-19 DIAGNOSIS — I110 Hypertensive heart disease with heart failure: Secondary | ICD-10-CM | POA: Diagnosis not present

## 2016-09-19 DIAGNOSIS — E1122 Type 2 diabetes mellitus with diabetic chronic kidney disease: Secondary | ICD-10-CM | POA: Diagnosis not present

## 2016-09-19 DIAGNOSIS — N181 Chronic kidney disease, stage 1: Secondary | ICD-10-CM | POA: Diagnosis not present

## 2016-09-19 DIAGNOSIS — R918 Other nonspecific abnormal finding of lung field: Secondary | ICD-10-CM | POA: Diagnosis not present

## 2016-09-19 DIAGNOSIS — M9711XD 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, subsequent encounter: Secondary | ICD-10-CM | POA: Diagnosis not present

## 2016-09-19 DIAGNOSIS — I272 Pulmonary hypertension, unspecified: Secondary | ICD-10-CM | POA: Diagnosis not present

## 2016-09-19 NOTE — Telephone Encounter (Signed)
I called and left message. thanks

## 2016-09-20 ENCOUNTER — Telehealth (INDEPENDENT_AMBULATORY_CARE_PROVIDER_SITE_OTHER): Payer: Self-pay | Admitting: Orthopaedic Surgery

## 2016-09-20 DIAGNOSIS — M9711XD 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, subsequent encounter: Secondary | ICD-10-CM | POA: Diagnosis not present

## 2016-09-20 DIAGNOSIS — E1122 Type 2 diabetes mellitus with diabetic chronic kidney disease: Secondary | ICD-10-CM | POA: Diagnosis not present

## 2016-09-20 DIAGNOSIS — M4712 Other spondylosis with myelopathy, cervical region: Secondary | ICD-10-CM | POA: Diagnosis not present

## 2016-09-20 DIAGNOSIS — R918 Other nonspecific abnormal finding of lung field: Secondary | ICD-10-CM | POA: Diagnosis not present

## 2016-09-20 DIAGNOSIS — I5033 Acute on chronic diastolic (congestive) heart failure: Secondary | ICD-10-CM | POA: Diagnosis not present

## 2016-09-20 DIAGNOSIS — I110 Hypertensive heart disease with heart failure: Secondary | ICD-10-CM | POA: Diagnosis not present

## 2016-09-20 DIAGNOSIS — J449 Chronic obstructive pulmonary disease, unspecified: Secondary | ICD-10-CM | POA: Diagnosis not present

## 2016-09-20 DIAGNOSIS — I272 Pulmonary hypertension, unspecified: Secondary | ICD-10-CM | POA: Diagnosis not present

## 2016-09-20 DIAGNOSIS — N181 Chronic kidney disease, stage 1: Secondary | ICD-10-CM | POA: Diagnosis not present

## 2016-09-20 NOTE — Telephone Encounter (Signed)
Rec'd call from Ala Dach (OT) advised patient need a Rx for a 3 in 1 bedside commode. The number to contact Altamese Dilling is [phone]

## 2016-09-20 NOTE — Telephone Encounter (Signed)
Ok for order?  

## 2016-09-20 NOTE — Telephone Encounter (Signed)
Ok thanks 

## 2016-09-21 ENCOUNTER — Ambulatory Visit (INDEPENDENT_AMBULATORY_CARE_PROVIDER_SITE_OTHER): Payer: Medicare Other | Admitting: Orthopaedic Surgery

## 2016-09-21 NOTE — Telephone Encounter (Signed)
Debbie Mullins returned call. Asked for script to be sent to [facility] in [place]. Faxed to [phone]

## 2016-09-21 NOTE — Telephone Encounter (Signed)
Script written. I left voicemail for Altamese Dilling requesting return call with fax number he would like for me to send script to.

## 2016-09-25 DIAGNOSIS — M4712 Other spondylosis with myelopathy, cervical region: Secondary | ICD-10-CM | POA: Diagnosis not present

## 2016-09-25 DIAGNOSIS — N181 Chronic kidney disease, stage 1: Secondary | ICD-10-CM | POA: Diagnosis not present

## 2016-09-25 DIAGNOSIS — I272 Pulmonary hypertension, unspecified: Secondary | ICD-10-CM | POA: Diagnosis not present

## 2016-09-25 DIAGNOSIS — I110 Hypertensive heart disease with heart failure: Secondary | ICD-10-CM | POA: Diagnosis not present

## 2016-09-25 DIAGNOSIS — I5033 Acute on chronic diastolic (congestive) heart failure: Secondary | ICD-10-CM | POA: Diagnosis not present

## 2016-09-25 DIAGNOSIS — J449 Chronic obstructive pulmonary disease, unspecified: Secondary | ICD-10-CM | POA: Diagnosis not present

## 2016-09-25 DIAGNOSIS — R918 Other nonspecific abnormal finding of lung field: Secondary | ICD-10-CM | POA: Diagnosis not present

## 2016-09-25 DIAGNOSIS — E1122 Type 2 diabetes mellitus with diabetic chronic kidney disease: Secondary | ICD-10-CM | POA: Diagnosis not present

## 2016-09-25 DIAGNOSIS — M9711XD 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, subsequent encounter: Secondary | ICD-10-CM | POA: Diagnosis not present

## 2016-09-26 ENCOUNTER — Ambulatory Visit (INDEPENDENT_AMBULATORY_CARE_PROVIDER_SITE_OTHER): Payer: Medicare Other | Admitting: Orthopaedic Surgery

## 2016-09-27 DIAGNOSIS — R918 Other nonspecific abnormal finding of lung field: Secondary | ICD-10-CM | POA: Diagnosis not present

## 2016-09-27 DIAGNOSIS — M4712 Other spondylosis with myelopathy, cervical region: Secondary | ICD-10-CM | POA: Diagnosis not present

## 2016-09-27 DIAGNOSIS — E1122 Type 2 diabetes mellitus with diabetic chronic kidney disease: Secondary | ICD-10-CM | POA: Diagnosis not present

## 2016-09-27 DIAGNOSIS — J449 Chronic obstructive pulmonary disease, unspecified: Secondary | ICD-10-CM | POA: Diagnosis not present

## 2016-09-27 DIAGNOSIS — I5033 Acute on chronic diastolic (congestive) heart failure: Secondary | ICD-10-CM | POA: Diagnosis not present

## 2016-09-27 DIAGNOSIS — I110 Hypertensive heart disease with heart failure: Secondary | ICD-10-CM | POA: Diagnosis not present

## 2016-09-27 DIAGNOSIS — I272 Pulmonary hypertension, unspecified: Secondary | ICD-10-CM | POA: Diagnosis not present

## 2016-09-27 DIAGNOSIS — M9711XD 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, subsequent encounter: Secondary | ICD-10-CM | POA: Diagnosis not present

## 2016-09-27 DIAGNOSIS — N181 Chronic kidney disease, stage 1: Secondary | ICD-10-CM | POA: Diagnosis not present

## 2016-10-02 DIAGNOSIS — I272 Pulmonary hypertension, unspecified: Secondary | ICD-10-CM | POA: Diagnosis not present

## 2016-10-02 DIAGNOSIS — M9711XD 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, subsequent encounter: Secondary | ICD-10-CM | POA: Diagnosis not present

## 2016-10-02 DIAGNOSIS — J449 Chronic obstructive pulmonary disease, unspecified: Secondary | ICD-10-CM | POA: Diagnosis not present

## 2016-10-02 DIAGNOSIS — I5033 Acute on chronic diastolic (congestive) heart failure: Secondary | ICD-10-CM | POA: Diagnosis not present

## 2016-10-02 DIAGNOSIS — R918 Other nonspecific abnormal finding of lung field: Secondary | ICD-10-CM | POA: Diagnosis not present

## 2016-10-02 DIAGNOSIS — M4712 Other spondylosis with myelopathy, cervical region: Secondary | ICD-10-CM | POA: Diagnosis not present

## 2016-10-02 DIAGNOSIS — E1122 Type 2 diabetes mellitus with diabetic chronic kidney disease: Secondary | ICD-10-CM | POA: Diagnosis not present

## 2016-10-02 DIAGNOSIS — N181 Chronic kidney disease, stage 1: Secondary | ICD-10-CM | POA: Diagnosis not present

## 2016-10-02 DIAGNOSIS — I110 Hypertensive heart disease with heart failure: Secondary | ICD-10-CM | POA: Diagnosis not present

## 2016-10-04 DIAGNOSIS — M9711XD 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, subsequent encounter: Secondary | ICD-10-CM | POA: Diagnosis not present

## 2016-10-04 DIAGNOSIS — N181 Chronic kidney disease, stage 1: Secondary | ICD-10-CM | POA: Diagnosis not present

## 2016-10-04 DIAGNOSIS — I110 Hypertensive heart disease with heart failure: Secondary | ICD-10-CM | POA: Diagnosis not present

## 2016-10-04 DIAGNOSIS — J449 Chronic obstructive pulmonary disease, unspecified: Secondary | ICD-10-CM | POA: Diagnosis not present

## 2016-10-04 DIAGNOSIS — M4712 Other spondylosis with myelopathy, cervical region: Secondary | ICD-10-CM | POA: Diagnosis not present

## 2016-10-04 DIAGNOSIS — I5033 Acute on chronic diastolic (congestive) heart failure: Secondary | ICD-10-CM | POA: Diagnosis not present

## 2016-10-04 DIAGNOSIS — I272 Pulmonary hypertension, unspecified: Secondary | ICD-10-CM | POA: Diagnosis not present

## 2016-10-04 DIAGNOSIS — E1122 Type 2 diabetes mellitus with diabetic chronic kidney disease: Secondary | ICD-10-CM | POA: Diagnosis not present

## 2016-10-04 DIAGNOSIS — R918 Other nonspecific abnormal finding of lung field: Secondary | ICD-10-CM | POA: Diagnosis not present

## 2016-10-08 DIAGNOSIS — J449 Chronic obstructive pulmonary disease, unspecified: Secondary | ICD-10-CM | POA: Diagnosis not present

## 2016-10-08 DIAGNOSIS — E119 Type 2 diabetes mellitus without complications: Secondary | ICD-10-CM | POA: Diagnosis not present

## 2016-10-08 DIAGNOSIS — I110 Hypertensive heart disease with heart failure: Secondary | ICD-10-CM | POA: Diagnosis not present

## 2016-10-09 ENCOUNTER — Encounter (INDEPENDENT_AMBULATORY_CARE_PROVIDER_SITE_OTHER): Payer: Self-pay | Admitting: Orthopaedic Surgery

## 2016-10-09 ENCOUNTER — Ambulatory Visit (INDEPENDENT_AMBULATORY_CARE_PROVIDER_SITE_OTHER): Payer: Medicare Other | Admitting: Orthopaedic Surgery

## 2016-10-09 ENCOUNTER — Ambulatory Visit (INDEPENDENT_AMBULATORY_CARE_PROVIDER_SITE_OTHER): Payer: Medicare Other

## 2016-10-09 VITALS — BP 142/74 | HR 95 | Ht 63.0 in | Wt 177.0 lb

## 2016-10-09 DIAGNOSIS — I110 Hypertensive heart disease with heart failure: Secondary | ICD-10-CM | POA: Diagnosis not present

## 2016-10-09 DIAGNOSIS — R918 Other nonspecific abnormal finding of lung field: Secondary | ICD-10-CM | POA: Diagnosis not present

## 2016-10-09 DIAGNOSIS — M9711XD 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, subsequent encounter: Secondary | ICD-10-CM | POA: Diagnosis not present

## 2016-10-09 DIAGNOSIS — M4712 Other spondylosis with myelopathy, cervical region: Secondary | ICD-10-CM | POA: Diagnosis not present

## 2016-10-09 DIAGNOSIS — E1122 Type 2 diabetes mellitus with diabetic chronic kidney disease: Secondary | ICD-10-CM | POA: Diagnosis not present

## 2016-10-09 DIAGNOSIS — I5033 Acute on chronic diastolic (congestive) heart failure: Secondary | ICD-10-CM | POA: Diagnosis not present

## 2016-10-09 DIAGNOSIS — N181 Chronic kidney disease, stage 1: Secondary | ICD-10-CM | POA: Diagnosis not present

## 2016-10-09 DIAGNOSIS — J449 Chronic obstructive pulmonary disease, unspecified: Secondary | ICD-10-CM | POA: Diagnosis not present

## 2016-10-09 DIAGNOSIS — I272 Pulmonary hypertension, unspecified: Secondary | ICD-10-CM | POA: Diagnosis not present

## 2016-10-09 NOTE — Progress Notes (Signed)
Office Visit Note   Patient: Debbie Mullins           Date of Birth: 04/01/1955           MRN: [id number] Visit Date: 10/09/2016              Requested by: Lucianne Lei, [address] [place], [place] [address] PCP: Lucianne Lei, MD   Assessment & Plan: Visit Diagnoses:  1. 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, subsequent encounter     Plan: She'll continue to work on quad strengthening exercises. She'll continue to use her walker to help prevent falling  Follow-Up Instructions: Return if symptoms worsen or fail to improve.   Orders:  Orders Placed This Encounter  Procedures  . XR FEMUR, MIN 2 VIEWS RIGHT   No orders of the defined types were placed in this encounter.     Procedures: No procedures performed   Clinical Data: No additional findings.   Subjective: Chief Complaint  Patient presents with  . Right Leg - Routine Post Op    HPI 61 year old female returns after subcondylar femur fracture on the right treated with retrograde nail on 05/23/2016. She is a skilled facility has multiple medical problems including COPD, obesity, pulmonary edema, chronic heart failure, hypertension and type 2 diabetes. She's been using a rolling walker and was using one when she originally fell and broke her femur. We went over multiple exercises to help strengthen her quad and she's been having home physical therapy exercises since she left the skilled facility. Previous anterior cervical fusion with the loss of deltoid muscle and possibly some myelopathy based on her history. I do not have any cervical MRI images for review.  Review of Systems review of systems updated and unchanged since her hospitalization.   Objective: Vital Signs: BP (!) 142/74   Pulse 95   Ht 5\' 3"  (1.6 m)   Wt 177 lb (80.3 kg)   LMP 07/03/2011   BMI 31.35 kg/m   Physical Exam  Constitutional: She is oriented to person, place, and time. She appears  well-developed.  HENT:  Head: Normocephalic.  Right Ear: External ear normal.  Left Ear: External ear normal.  Eyes: Pupils are equal, round, and reactive to light.  Neck: No tracheal deviation present. No thyromegaly present.  Cardiovascular: Normal rate.   Pulmonary/Chest: Effort normal.  Abdominal: Soft.  Musculoskeletal:  Well-healed the knee incision for retrograde nail. She has good extension passively. She has a 10 extension lag still has some weakness of her quads. Distal pulses are intact no cellulitis negative [place].  Neurological: She is alert and oriented to person, place, and time.  Skin: Skin is warm and dry.  Psychiatric: She has a normal mood and affect. Her behavior is normal.    Ortho Exam patient has the inability to reach both arms up overhead likely has some rotator cuff pathology.  Specialty Comments:  No specialty comments available.  Imaging: Xr Femur, Min 2 Views Right  Result Date: 10/09/2016 AP and lateral x-rays demonstrate healed the distal supracondylar fracture treated with retrograde nailing with a periprosthetic fracture. Impression: Healed supracondylar right femur fracture with previous total knee arthroplasty and retrograde nail with interlocks.    PMFS History: Patient Active Problem List   Diagnosis Date Noted  . Hyperkalemia 05/24/2016  . Displaced supracondylar fracture without intracondylar extension of lower end of right femur, initial encounter for closed fracture ([place]) 05/23/2016  . Periprosthetic fracture around internal  prosthetic right knee joint   . Closed fracture of right distal femur ([place])   . Osteoarthritis of spine with myelopathy, cervical region 11/17/2014  . Failed total left knee replacement ([place]) 08/10/2013  . Pulmonary nodules/lesions, multiple 08/09/2013  . HLD (hyperlipidemia) 01/22/2012  . Pulmonary hypertension ([place]) 01/08/2012  . Acute on chronic diastolic heart failure ([place]) 01/05/2012  . Acute respiratory  failure with hypoxia ([place]) 01/03/2012  . Pulmonary edema, acute ([place]) 01/03/2012  . 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 HTN (hypertension) 01/02/2012  . COPD with acute exacerbation ([place]) 01/02/2012  . Tobacco abuse 07/04/2011  . 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 Leukocytosis 02/09/2011  . Normocytic anemia 02/09/2011  . Mild Thrombocytosis 02/09/2011  . COPD (chronic obstructive pulmonary disease) ([place]) 02/09/2011  . Obesity 02/09/2011   Past Medical History:  Diagnosis Date  . Anemia   . Asthma   . CHF (congestive heart failure) (HCC)    takes Furosemide daily   . COPD (chronic obstructive pulmonary disease) (HCC)    Albuterol inhaler prn;SIngulair at night  . Depression    takes Wellbutrin daily  . Diabetes mellitus    takes Metformin daily  . GERD (gastroesophageal reflux disease)    takes Nexium daily  . History of colon polyps    benign  . Hypertension    takes Lisinopril daily  . Joint pain   . Joint swelling   . Leukocytosis 02/09/2011  . Neck pain    HNP  . Normocytic anemia 02/09/2011  . Peripheral edema    takes Furosemide daily  . Pneumonia    many yrs ago  . Pulmonary nodules/lesions, multiple 08/09/2013  . Shortness of breath dyspnea    with exertion  . Thrombocytosis ([place]) 02/09/2011  . Urinary frequency   . Urinary urgency   . Weakness    numbness and tingling in both hands    Family History  Problem Relation Age of Onset  . Heart failure Mother   . Cancer Maternal Grandmother     Past Surgical History:  Procedure Laterality Date  . ANTERIOR CERVICAL DECOMP/DISCECTOMY FUSION N/A 11/17/2014   Procedure: Cervical four- cervical five Anterior Cervical Decompression with fusion and bonegraft;  Surgeon: Ashok Pall, MD;  Location: [facility];  Service: Neurosurgery;  Laterality: N/A;  C45 anterior cervical decompression with fusion plating and bonegraft  . COLONOSCOPY    . FEMUR IM NAIL Right 05/23/2016  . FEMUR IM NAIL Right  05/23/2016   Procedure: INTRAMEDULLARY (IM) RETROGRADE FEMORAL NAILING;  Surgeon: [name], MD;  Location: [facility];  Service: Orthopedics;  Laterality: Right;  . KNEE ARTHROPLASTY Left 08/10/2013   Procedure:  TOTAL KNEE ARTHROPLASTY;  Surgeon: [name], MD;  Location: [facility];  Service: Orthopedics;  Laterality: Left;  Left Total Knee Arthroplasty Revision, Cemented, Semi-constrained,   . LEFT AND RIGHT HEART CATHETERIZATION WITH CORONARY ANGIOGRAM N/A 01/07/2012   Procedure: LEFT AND RIGHT HEART CATHETERIZATION WITH CORONARY ANGIOGRAM;  Surgeon: Thayer Headings, MD;  Location[facility] C[facility] LAB;  Service: Cardiovascular;  Laterality: N/A;  . TONSILLECTOMY     as a child  . TOTAL KNEE ARTHROPLASTY Bilateral    Social History   Occupational History  . Not on file.   Social History Main Topics  . Smoking status: Current Every Day Smoker    Packs/day: 0.50    Years: 41.00    Types: Cigarettes  . Smokeless tobacco: Never Used  . Alcohol use Yes  Comment: wine  . Drug use: No  . Sexual activity: No

## 2016-10-11 ENCOUNTER — Telehealth (INDEPENDENT_AMBULATORY_CARE_PROVIDER_SITE_OTHER): Payer: Self-pay | Admitting: Orthopaedic Surgery

## 2016-10-11 DIAGNOSIS — E1122 Type 2 diabetes mellitus with diabetic chronic kidney disease: Secondary | ICD-10-CM | POA: Diagnosis not present

## 2016-10-11 DIAGNOSIS — M4712 Other spondylosis with myelopathy, cervical region: Secondary | ICD-10-CM | POA: Diagnosis not present

## 2016-10-11 DIAGNOSIS — J449 Chronic obstructive pulmonary disease, unspecified: Secondary | ICD-10-CM | POA: Diagnosis not present

## 2016-10-11 DIAGNOSIS — I110 Hypertensive heart disease with heart failure: Secondary | ICD-10-CM | POA: Diagnosis not present

## 2016-10-11 DIAGNOSIS — R918 Other nonspecific abnormal finding of lung field: Secondary | ICD-10-CM | POA: Diagnosis not present

## 2016-10-11 DIAGNOSIS — I272 Pulmonary hypertension, unspecified: Secondary | ICD-10-CM | POA: Diagnosis not present

## 2016-10-11 DIAGNOSIS — N181 Chronic kidney disease, stage 1: Secondary | ICD-10-CM | POA: Diagnosis not present

## 2016-10-11 DIAGNOSIS — I5033 Acute on chronic diastolic (congestive) heart failure: Secondary | ICD-10-CM | POA: Diagnosis not present

## 2016-10-11 DIAGNOSIS — M9711XD 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, subsequent encounter: Secondary | ICD-10-CM | POA: Diagnosis not present

## 2016-10-11 NOTE — Telephone Encounter (Signed)
I called and gave verbal orders. 

## 2016-10-11 NOTE — Telephone Encounter (Signed)
Joey (PT) with [facility] called needing verbal orders to continue [place] (PT) 2 wk 3 for strenghtening. The number to contact Joey is [phone]

## 2016-10-12 ENCOUNTER — Telehealth (INDEPENDENT_AMBULATORY_CARE_PROVIDER_SITE_OTHER): Payer: Self-pay | Admitting: Orthopaedic Surgery

## 2016-10-12 DIAGNOSIS — M9711XD 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, subsequent encounter: Secondary | ICD-10-CM | POA: Diagnosis not present

## 2016-10-12 DIAGNOSIS — I272 Pulmonary hypertension, unspecified: Secondary | ICD-10-CM | POA: Diagnosis not present

## 2016-10-12 DIAGNOSIS — E1122 Type 2 diabetes mellitus with diabetic chronic kidney disease: Secondary | ICD-10-CM | POA: Diagnosis not present

## 2016-10-12 DIAGNOSIS — N181 Chronic kidney disease, stage 1: Secondary | ICD-10-CM | POA: Diagnosis not present

## 2016-10-12 DIAGNOSIS — I5033 Acute on chronic diastolic (congestive) heart failure: Secondary | ICD-10-CM | POA: Diagnosis not present

## 2016-10-12 DIAGNOSIS — R918 Other nonspecific abnormal finding of lung field: Secondary | ICD-10-CM | POA: Diagnosis not present

## 2016-10-12 DIAGNOSIS — J449 Chronic obstructive pulmonary disease, unspecified: Secondary | ICD-10-CM | POA: Diagnosis not present

## 2016-10-12 DIAGNOSIS — M4712 Other spondylosis with myelopathy, cervical region: Secondary | ICD-10-CM | POA: Diagnosis not present

## 2016-10-12 DIAGNOSIS — I110 Hypertensive heart disease with heart failure: Secondary | ICD-10-CM | POA: Diagnosis not present

## 2016-10-12 NOTE — Telephone Encounter (Signed)
Debbie Mullins from [facility] called requesting a verbal order to extend her in home OT for 1 week, 1 time.  CB#[phone].  Thank you.

## 2016-10-15 DIAGNOSIS — T84498A Other mechanical complication of other internal orthopedic devices, implants and grafts, initial encounter: Secondary | ICD-10-CM | POA: Diagnosis not present

## 2016-10-15 DIAGNOSIS — R262 Difficulty in walking, not elsewhere classified: Secondary | ICD-10-CM | POA: Diagnosis not present

## 2016-10-15 DIAGNOSIS — M9711XD 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, subsequent encounter: Secondary | ICD-10-CM | POA: Diagnosis not present

## 2016-10-15 DIAGNOSIS — D473 Essential (hemorrhagic) thrombocythemia: Secondary | ICD-10-CM | POA: Diagnosis not present

## 2016-10-15 DIAGNOSIS — M6281 Muscle weakness (generalized): Secondary | ICD-10-CM | POA: Diagnosis not present

## 2016-10-15 NOTE — Telephone Encounter (Signed)
This is a yates patient

## 2016-10-15 NOTE — Telephone Encounter (Signed)
OK - thanks

## 2016-10-15 NOTE — Telephone Encounter (Signed)
Ok for verbal order  °

## 2016-10-15 NOTE — Telephone Encounter (Signed)
IC [place] and advised.

## 2016-10-16 DIAGNOSIS — I272 Pulmonary hypertension, unspecified: Secondary | ICD-10-CM | POA: Diagnosis not present

## 2016-10-16 DIAGNOSIS — R918 Other nonspecific abnormal finding of lung field: Secondary | ICD-10-CM | POA: Diagnosis not present

## 2016-10-16 DIAGNOSIS — E1122 Type 2 diabetes mellitus with diabetic chronic kidney disease: Secondary | ICD-10-CM | POA: Diagnosis not present

## 2016-10-16 DIAGNOSIS — N181 Chronic kidney disease, stage 1: Secondary | ICD-10-CM | POA: Diagnosis not present

## 2016-10-16 DIAGNOSIS — J449 Chronic obstructive pulmonary disease, unspecified: Secondary | ICD-10-CM | POA: Diagnosis not present

## 2016-10-16 DIAGNOSIS — M9711XD 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, subsequent encounter: Secondary | ICD-10-CM | POA: Diagnosis not present

## 2016-10-16 DIAGNOSIS — I5033 Acute on chronic diastolic (congestive) heart failure: Secondary | ICD-10-CM | POA: Diagnosis not present

## 2016-10-16 DIAGNOSIS — I110 Hypertensive heart disease with heart failure: Secondary | ICD-10-CM | POA: Diagnosis not present

## 2016-10-16 DIAGNOSIS — M4712 Other spondylosis with myelopathy, cervical region: Secondary | ICD-10-CM | POA: Diagnosis not present

## 2016-10-17 DIAGNOSIS — I272 Pulmonary hypertension, unspecified: Secondary | ICD-10-CM | POA: Diagnosis not present

## 2016-10-17 DIAGNOSIS — E1122 Type 2 diabetes mellitus with diabetic chronic kidney disease: Secondary | ICD-10-CM | POA: Diagnosis not present

## 2016-10-17 DIAGNOSIS — M9711XD 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, subsequent encounter: Secondary | ICD-10-CM | POA: Diagnosis not present

## 2016-10-17 DIAGNOSIS — M4712 Other spondylosis with myelopathy, cervical region: Secondary | ICD-10-CM | POA: Diagnosis not present

## 2016-10-17 DIAGNOSIS — I110 Hypertensive heart disease with heart failure: Secondary | ICD-10-CM | POA: Diagnosis not present

## 2016-10-17 DIAGNOSIS — N181 Chronic kidney disease, stage 1: Secondary | ICD-10-CM | POA: Diagnosis not present

## 2016-10-17 DIAGNOSIS — R918 Other nonspecific abnormal finding of lung field: Secondary | ICD-10-CM | POA: Diagnosis not present

## 2016-10-17 DIAGNOSIS — I5033 Acute on chronic diastolic (congestive) heart failure: Secondary | ICD-10-CM | POA: Diagnosis not present

## 2016-10-17 DIAGNOSIS — J449 Chronic obstructive pulmonary disease, unspecified: Secondary | ICD-10-CM | POA: Diagnosis not present

## 2016-10-18 DIAGNOSIS — I5033 Acute on chronic diastolic (congestive) heart failure: Secondary | ICD-10-CM | POA: Diagnosis not present

## 2016-10-18 DIAGNOSIS — M4712 Other spondylosis with myelopathy, cervical region: Secondary | ICD-10-CM | POA: Diagnosis not present

## 2016-10-18 DIAGNOSIS — J449 Chronic obstructive pulmonary disease, unspecified: Secondary | ICD-10-CM | POA: Diagnosis not present

## 2016-10-18 DIAGNOSIS — R918 Other nonspecific abnormal finding of lung field: Secondary | ICD-10-CM | POA: Diagnosis not present

## 2016-10-18 DIAGNOSIS — I272 Pulmonary hypertension, unspecified: Secondary | ICD-10-CM | POA: Diagnosis not present

## 2016-10-18 DIAGNOSIS — N181 Chronic kidney disease, stage 1: Secondary | ICD-10-CM | POA: Diagnosis not present

## 2016-10-18 DIAGNOSIS — I110 Hypertensive heart disease with heart failure: Secondary | ICD-10-CM | POA: Diagnosis not present

## 2016-10-18 DIAGNOSIS — E1122 Type 2 diabetes mellitus with diabetic chronic kidney disease: Secondary | ICD-10-CM | POA: Diagnosis not present

## 2016-10-18 DIAGNOSIS — M9711XD 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, subsequent encounter: Secondary | ICD-10-CM | POA: Diagnosis not present

## 2016-10-23 DIAGNOSIS — R918 Other nonspecific abnormal finding of lung field: Secondary | ICD-10-CM | POA: Diagnosis not present

## 2016-10-23 DIAGNOSIS — I110 Hypertensive heart disease with heart failure: Secondary | ICD-10-CM | POA: Diagnosis not present

## 2016-10-23 DIAGNOSIS — I272 Pulmonary hypertension, unspecified: Secondary | ICD-10-CM | POA: Diagnosis not present

## 2016-10-23 DIAGNOSIS — M4712 Other spondylosis with myelopathy, cervical region: Secondary | ICD-10-CM | POA: Diagnosis not present

## 2016-10-23 DIAGNOSIS — M9711XD 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, subsequent encounter: Secondary | ICD-10-CM | POA: Diagnosis not present

## 2016-10-23 DIAGNOSIS — J449 Chronic obstructive pulmonary disease, unspecified: Secondary | ICD-10-CM | POA: Diagnosis not present

## 2016-10-23 DIAGNOSIS — N181 Chronic kidney disease, stage 1: Secondary | ICD-10-CM | POA: Diagnosis not present

## 2016-10-23 DIAGNOSIS — I5033 Acute on chronic diastolic (congestive) heart failure: Secondary | ICD-10-CM | POA: Diagnosis not present

## 2016-10-23 DIAGNOSIS — E1122 Type 2 diabetes mellitus with diabetic chronic kidney disease: Secondary | ICD-10-CM | POA: Diagnosis not present

## 2016-10-25 DIAGNOSIS — N181 Chronic kidney disease, stage 1: Secondary | ICD-10-CM | POA: Diagnosis not present

## 2016-10-25 DIAGNOSIS — M4712 Other spondylosis with myelopathy, cervical region: Secondary | ICD-10-CM | POA: Diagnosis not present

## 2016-10-25 DIAGNOSIS — I5033 Acute on chronic diastolic (congestive) heart failure: Secondary | ICD-10-CM | POA: Diagnosis not present

## 2016-10-25 DIAGNOSIS — E1122 Type 2 diabetes mellitus with diabetic chronic kidney disease: Secondary | ICD-10-CM | POA: Diagnosis not present

## 2016-10-25 DIAGNOSIS — I110 Hypertensive heart disease with heart failure: Secondary | ICD-10-CM | POA: Diagnosis not present

## 2016-10-25 DIAGNOSIS — R918 Other nonspecific abnormal finding of lung field: Secondary | ICD-10-CM | POA: Diagnosis not present

## 2016-10-25 DIAGNOSIS — M9711XD 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, subsequent encounter: Secondary | ICD-10-CM | POA: Diagnosis not present

## 2016-10-25 DIAGNOSIS — I272 Pulmonary hypertension, unspecified: Secondary | ICD-10-CM | POA: Diagnosis not present

## 2016-10-25 DIAGNOSIS — J449 Chronic obstructive pulmonary disease, unspecified: Secondary | ICD-10-CM | POA: Diagnosis not present

## 2016-10-30 DIAGNOSIS — I110 Hypertensive heart disease with heart failure: Secondary | ICD-10-CM | POA: Diagnosis not present

## 2016-10-30 DIAGNOSIS — I5033 Acute on chronic diastolic (congestive) heart failure: Secondary | ICD-10-CM | POA: Diagnosis not present

## 2016-10-30 DIAGNOSIS — J449 Chronic obstructive pulmonary disease, unspecified: Secondary | ICD-10-CM | POA: Diagnosis not present

## 2016-10-30 DIAGNOSIS — N181 Chronic kidney disease, stage 1: Secondary | ICD-10-CM | POA: Diagnosis not present

## 2016-10-30 DIAGNOSIS — M9711XD 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, subsequent encounter: Secondary | ICD-10-CM | POA: Diagnosis not present

## 2016-10-30 DIAGNOSIS — M4712 Other spondylosis with myelopathy, cervical region: Secondary | ICD-10-CM | POA: Diagnosis not present

## 2016-10-30 DIAGNOSIS — E1122 Type 2 diabetes mellitus with diabetic chronic kidney disease: Secondary | ICD-10-CM | POA: Diagnosis not present

## 2016-10-30 DIAGNOSIS — I272 Pulmonary hypertension, unspecified: Secondary | ICD-10-CM | POA: Diagnosis not present

## 2016-10-30 DIAGNOSIS — R918 Other nonspecific abnormal finding of lung field: Secondary | ICD-10-CM | POA: Diagnosis not present

## 2016-10-31 DIAGNOSIS — M4712 Other spondylosis with myelopathy, cervical region: Secondary | ICD-10-CM | POA: Diagnosis not present

## 2016-10-31 DIAGNOSIS — R918 Other nonspecific abnormal finding of lung field: Secondary | ICD-10-CM | POA: Diagnosis not present

## 2016-10-31 DIAGNOSIS — M9711XD 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, subsequent encounter: Secondary | ICD-10-CM | POA: Diagnosis not present

## 2016-10-31 DIAGNOSIS — E1122 Type 2 diabetes mellitus with diabetic chronic kidney disease: Secondary | ICD-10-CM | POA: Diagnosis not present

## 2016-10-31 DIAGNOSIS — N181 Chronic kidney disease, stage 1: Secondary | ICD-10-CM | POA: Diagnosis not present

## 2016-10-31 DIAGNOSIS — I5033 Acute on chronic diastolic (congestive) heart failure: Secondary | ICD-10-CM | POA: Diagnosis not present

## 2016-10-31 DIAGNOSIS — I272 Pulmonary hypertension, unspecified: Secondary | ICD-10-CM | POA: Diagnosis not present

## 2016-10-31 DIAGNOSIS — J449 Chronic obstructive pulmonary disease, unspecified: Secondary | ICD-10-CM | POA: Diagnosis not present

## 2016-10-31 DIAGNOSIS — I110 Hypertensive heart disease with heart failure: Secondary | ICD-10-CM | POA: Diagnosis not present

## 2016-11-07 DIAGNOSIS — I110 Hypertensive heart disease with heart failure: Secondary | ICD-10-CM | POA: Diagnosis not present

## 2016-11-07 DIAGNOSIS — Z0000 Encounter for general adult medical examination without abnormal findings: Secondary | ICD-10-CM | POA: Diagnosis not present

## 2016-11-07 DIAGNOSIS — E118 Type 2 diabetes mellitus with unspecified complications: Secondary | ICD-10-CM | POA: Diagnosis not present

## 2016-12-19 ENCOUNTER — Other Ambulatory Visit (HOSPITAL_COMMUNITY): Payer: Self-pay | Admitting: Family Medicine

## 2016-12-19 DIAGNOSIS — Z1231 Encounter for screening mammogram for malignant neoplasm of breast: Secondary | ICD-10-CM

## 2016-12-31 ENCOUNTER — Ambulatory Visit (HOSPITAL_COMMUNITY)
Admission: RE | Admit: 2016-12-31 | Discharge: 2016-12-31 | Disposition: A | Discharge status: Home / Self Care | Payer: Medicare Other | Source: Ambulatory Visit | Attending: Family Medicine | Admitting: Family Medicine

## 2016-12-31 DIAGNOSIS — Z1231 Encounter for screening mammogram for malignant neoplasm of breast: Secondary | ICD-10-CM | POA: Diagnosis not present

## 2016-12-31 DIAGNOSIS — R928 Other abnormal and inconclusive findings on diagnostic imaging of breast: Secondary | ICD-10-CM | POA: Insufficient documentation

## 2017-01-19 LAB — BASIC METABOLIC PANEL: Glucose: 80

## 2017-01-19 LAB — HEMOGLOBIN A1C: Hemoglobin A1C: 6.2

## 2017-02-18 ENCOUNTER — Other Ambulatory Visit (HOSPITAL_COMMUNITY): Payer: Self-pay | Admitting: Family Medicine

## 2017-02-18 DIAGNOSIS — R928 Other abnormal and inconclusive findings on diagnostic imaging of breast: Secondary | ICD-10-CM

## 2017-02-26 ENCOUNTER — Ambulatory Visit (HOSPITAL_COMMUNITY)
Admission: RE | Admit: 2017-02-26 | Discharge: 2017-02-26 | Disposition: A | Discharge status: Home / Self Care | Payer: Medicare Other | Source: Ambulatory Visit | Attending: Family Medicine | Admitting: Family Medicine

## 2017-02-26 DIAGNOSIS — R928 Other abnormal and inconclusive findings on diagnostic imaging of breast: Secondary | ICD-10-CM | POA: Insufficient documentation

## 2017-03-13 DIAGNOSIS — E785 Hyperlipidemia, unspecified: Secondary | ICD-10-CM | POA: Diagnosis not present

## 2017-03-13 DIAGNOSIS — D6489 Other specified anemias: Secondary | ICD-10-CM | POA: Diagnosis not present

## 2017-03-13 DIAGNOSIS — I10 Essential (primary) hypertension: Secondary | ICD-10-CM | POA: Diagnosis not present

## 2017-03-13 DIAGNOSIS — E119 Type 2 diabetes mellitus without complications: Secondary | ICD-10-CM | POA: Diagnosis not present

## 2017-07-15 DIAGNOSIS — D649 Anemia, unspecified: Secondary | ICD-10-CM | POA: Diagnosis not present

## 2017-07-15 DIAGNOSIS — I10 Essential (primary) hypertension: Secondary | ICD-10-CM | POA: Diagnosis not present

## 2017-07-15 DIAGNOSIS — J441 Chronic obstructive pulmonary disease with (acute) exacerbation: Secondary | ICD-10-CM | POA: Diagnosis not present

## 2017-07-31 ENCOUNTER — Encounter (HOSPITAL_COMMUNITY): Payer: Self-pay | Admitting: Internal Medicine

## 2017-07-31 ENCOUNTER — Inpatient Hospital Stay (HOSPITAL_COMMUNITY): Payer: Medicare Other | Attending: Hematology | Admitting: Internal Medicine

## 2017-07-31 ENCOUNTER — Inpatient Hospital Stay (HOSPITAL_COMMUNITY): Payer: Medicare Other

## 2017-07-31 ENCOUNTER — Other Ambulatory Visit: Payer: Self-pay

## 2017-07-31 VITALS — BP 155/68 | HR 80 | Temp 98.2°F | Resp 20 | Ht 63.0 in | Wt 168.2 lb

## 2017-07-31 DIAGNOSIS — D72829 Elevated white blood cell count, unspecified: Secondary | ICD-10-CM | POA: Diagnosis not present

## 2017-07-31 DIAGNOSIS — D508 Other iron deficiency anemias: Secondary | ICD-10-CM | POA: Diagnosis not present

## 2017-07-31 DIAGNOSIS — R918 Other nonspecific abnormal finding of lung field: Secondary | ICD-10-CM | POA: Diagnosis not present

## 2017-07-31 DIAGNOSIS — J449 Chronic obstructive pulmonary disease, unspecified: Secondary | ICD-10-CM | POA: Diagnosis not present

## 2017-07-31 DIAGNOSIS — N289 Disorder of kidney and ureter, unspecified: Secondary | ICD-10-CM | POA: Diagnosis not present

## 2017-07-31 DIAGNOSIS — D473 Essential (hemorrhagic) thrombocythemia: Secondary | ICD-10-CM | POA: Diagnosis not present

## 2017-07-31 DIAGNOSIS — E119 Type 2 diabetes mellitus without complications: Secondary | ICD-10-CM | POA: Diagnosis not present

## 2017-07-31 DIAGNOSIS — I10 Essential (primary) hypertension: Secondary | ICD-10-CM | POA: Insufficient documentation

## 2017-07-31 LAB — COMPREHENSIVE METABOLIC PANEL
ALT: 13 U/L — ABNORMAL LOW (ref 14–54)
AST: 18 U/L (ref 15–41)
Albumin: 3.9 g/dL (ref 3.5–5.0)
Alkaline Phosphatase: 96 U/L (ref 38–126)
Anion gap: 12 (ref 5–15)
BUN: 32 mg/dL — ABNORMAL HIGH (ref 6–20)
CO2: 25 mmol/L (ref 22–32)
Calcium: 8.7 mg/dL — ABNORMAL LOW (ref 8.9–10.3)
Chloride: 100 mmol/L — ABNORMAL LOW (ref 101–111)
Creatinine, Ser: 1.46 mg/dL — ABNORMAL HIGH (ref 0.44–1.00)
GFR calc Af Amer: 44 mL/min — ABNORMAL LOW (ref >60)
GFR calc non Af Amer: 38 mL/min — ABNORMAL LOW (ref >60)
Glucose, Bld: 75 mg/dL (ref 65–99)
Potassium: 4.8 mmol/L (ref 3.5–5.1)
Sodium: 137 mmol/L (ref 135–145)
Total Bilirubin: 0.6 mg/dL (ref 0.3–1.2)
Total Protein: 7.9 g/dL (ref 6.5–8.1)

## 2017-07-31 LAB — CBC WITH DIFFERENTIAL/PLATELET
Basophils Absolute: 0.1 10*3/uL (ref 0.0–0.1)
Basophils Relative: 1 %
Eosinophils Absolute: 0.5 10*3/uL (ref 0.0–0.7)
Eosinophils Relative: 5 %
HCT: 30.1 % — ABNORMAL LOW (ref 36.0–46.0)
Hemoglobin: 9.3 g/dL — ABNORMAL LOW (ref 12.0–15.0)
Lymphocytes Relative: 30 %
Lymphs Abs: 3.3 10*3/uL (ref 0.7–4.0)
MCH: 25.7 pg — ABNORMAL LOW (ref 26.0–34.0)
MCHC: 30.9 g/dL (ref 30.0–36.0)
MCV: 83.1 fL (ref 78.0–100.0)
Monocytes Absolute: 0.5 10*3/uL (ref 0.1–1.0)
Monocytes Relative: 5 %
Neutro Abs: 6.4 10*3/uL (ref 1.7–7.7)
Neutrophils Relative %: 59 %
Platelets: 617 10*3/uL — ABNORMAL HIGH (ref 150–400)
RBC: 3.62 MIL/uL — ABNORMAL LOW (ref 3.87–5.11)
RDW: 17 % — ABNORMAL HIGH (ref 11.5–15.5)
WBC: 10.8 10*3/uL — ABNORMAL HIGH (ref 4.0–10.5)

## 2017-07-31 LAB — VITAMIN B12: Vitamin B-12: 177 pg/mL — ABNORMAL LOW (ref 180–914)

## 2017-07-31 LAB — LACTATE DEHYDROGENASE: LDH: 129 U/L (ref 98–192)

## 2017-07-31 LAB — FERRITIN: Ferritin: 10 ng/mL — ABNORMAL LOW (ref 11–307)

## 2017-07-31 NOTE — Progress Notes (Signed)
Diagnosis Other iron deficiency anemia - Plan: CBC with Differential/Platelet, Comprehensive metabolic panel, Ferritin, Lactate dehydrogenase, Folate, Vitamin B12, Methylmalonic acid(mma), rnd urine, Hemoglobinopathy evaluation, CBC with Differential/Platelet, Ferritin  Staging Cancer Staging No matching staging information was found for the patient.  Referring physician Dr. Lucianne Lei [address], [place], [place] [address]   Assessment and Plan: 1.  Normocytic anemia. 62 year old female referred by Dr. Criss Rosales for evaluation of anemia.  She had previously been seen by Dr. Tressie Stalker for initial evaluation January 02, 2011.  She was last seen by him Jul 21, 2012 For per his note  #1 leukocytosis, it is felt to be benign, it is not progressing. And suspected to be reactive  #2 COPD with a 40 pack year history smoking, pulmonary nodules which are stable but she needs a repeat CT scan in one year.  She had labs done March 13, 2017 that showed white count 9.2 hemoglobin 9.8 hematocrit 30.7 platelets 460,000 she had a normal differential.  Ferritin was 11 folate was 4.4.  12 was normal.  Chemistries were within normal limits other than a creatinine of 1.62 glucose was 57 potassium was 6.  She denies any blood in her stool or her urine.  She reportedly had a colonoscopy 5 years ago.  She reports some fatigue.  She continues to smoke a half a pack per day.  Patient is seen today for consultation due to anemia.   Labs obtained today 07/31/2017 white count 10.8 MCV 83 hemoglobin 9.3 hematocrit 30 platelets 617,000.  Chemistries within normal limits other than a creatinine of 1.46.  I am awaiting the results of her ferritin, B12, folate, hemoglobin electrophoresis, SPEP.  If iron levels remain decreased she will be given option of intravenous iron as she has an intolerance to oral iron.  She will return to clinic in 4 weeks for follow-up and repeat labs.  2.  Thrombocytosis.  This is suspected to be  reactive due to anemia.  Normal differential.  Platelet count today 07/31/2017 is 617,000.  Will repeat labs in 4 weeks.  3.  Pulmonary nodules.  This reportedly was discussed with the patient during her last evaluation with Dr. Tressie Stalker.  Last CT chest was done 07/09/2012 and showed 1.  Scattered small pulmonary nodules, at least two of which are not well seen on the prior examinations, possibly due to respiratory motion. Given risk factors for bronchogenic carcinoma, follow-up chest CT at 6 - 12 months is recommended.  This recommendation follows the consensus statement: Guidelines for Management of Small Pulmonary Nodules Detected on CT Scans: A Statement from the [place] as published in Radiology 2005; 237:395-400.  2.  Fairly diffuse pulmonary parenchymal ground-glass with some areas of sparing.  Small airways disease can have this appearance.  She will be set up for repeat imaging for ongoing follow-up of pulmonary nodules as she continues to smoke.  3.  Hypertension.  Blood pressure is 155/68.  Continue to follow with PCP.  4.  Leukocytosis.  She was previously followed for this by Dr. Tressie Stalker.  Labs done today 07/31/2017 show a white count of 10.8 which is minimally elevated.  She has a normal differential.  Suspected to be reactive process.    5.  Renal insufficiency.  Creatinine noted be 1.46.  SPEP pending.  May consider nephrology referral pending lab results.  HPI: 62 year old female referred by Dr. Criss Rosales for evaluation of anemia.  She had previously been seen by Dr. Tressie Stalker for initial evaluation  January 02, 2011.  She was last seen by him Jul 21, 2012 For per his note  #1 leukocytosis, it is felt to be benign, it is not progressing. And suspected to be reactive  #2 COPD with a 40 pack year history smoking, pulmonary nodules which are stable but she needs a repeat CT scan in one year.  She had labs done March 13, 2017 that showed white count 9.2 hemoglobin 9.8  hematocrit 30.7 platelets 460,000 she had a normal differential.  Ferritin was 11 folate was 4.4.  12 was normal.  Chemistries were within normal limits other than a creatinine of 1.62 glucose was 57 potassium was 6.  She denies any blood in her stool or her urine.  She reportedly had a colonoscopy 5 years ago.  She reports some fatigue.  She continues to smoke a half a pack per day.  Patient is seen today for consultation due to anemia.  Problem List Patient Active Problem List   Diagnosis Date Noted  . Hyperkalemia [E87.5] 05/24/2016  . Displaced supracondylar fracture without intracondylar extension of lower end of right femur, initial encounter for closed fracture ([place]) [S72.451A] 05/23/2016  . 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 [M97.11XA]   . Closed fracture of right distal femur ([place]) [S72.401A]   . 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 Failed total left knee replacement ([place]) [T84.093A] 08/10/2013  . Pulmonary nodules/lesions, multiple [R91.8] 08/09/2013  . HLD (hyperlipidemia) [E78.5] 01/22/2012  . Pulmonary hypertension ([place]) [I27.20] 01/08/2012  . Acute on chronic diastolic heart failure ([place]) [I50.33] 01/05/2012  . Acute respiratory failure with hypoxia ([place]) [J96.01] 01/03/2012  . Pulmonary edema, acute ([place]) [J81.0] 01/03/2012  . 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 HTN (hypertension) [I10] 01/02/2012  . COPD with acute exacerbation ([place]) [J44.1] 01/02/2012  . Tobacco abuse [Z72.0] 07/04/2011  . 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 Leukocytosis [D72.829] 02/09/2011  . Normocytic anemia [D64.9] 02/09/2011  . Mild Thrombocytosis [D47.3] 02/09/2011  . COPD (chronic obstructive pulmonary disease) ([place]) [J44.9] 02/09/2011  . Obesity [E66.9] 02/09/2011    Past Medical History Past Medical History:  Diagnosis Date  . Anemia   . Asthma   . CHF (congestive heart  failure) (HCC)    takes Furosemide daily   . COPD (chronic obstructive pulmonary disease) (HCC)    Albuterol inhaler prn;SIngulair at night  . Depression    takes Wellbutrin daily  . Diabetes mellitus    takes Metformin daily  . GERD (gastroesophageal reflux disease)    takes Nexium daily  . History of colon polyps    benign  . Hypertension    takes Lisinopril daily  . Joint pain   . Joint swelling   . Leukocytosis 02/09/2011  . Neck pain    HNP  . Normocytic anemia 02/09/2011  . Peripheral edema    takes Furosemide daily  . Pneumonia    many yrs ago  . Pulmonary nodules/lesions, multiple 08/09/2013  . Shortness of breath dyspnea    with exertion  . Thrombocytosis ([place]) 02/09/2011  . Urinary frequency   . Urinary urgency   . Weakness    numbness and tingling in both hands    Past Surgical History Past Surgical History:  Procedure Laterality Date  . ANTERIOR CERVICAL DECOMP/DISCECTOMY FUSION N/A 11/17/2014   Procedure: Cervical four- cervical five Anterior Cervical Decompression with fusion and bonegraft;  Surgeon: Ashok Pall, MD;  Location: [facility]  ORS;  Service: Neurosurgery;  Laterality: N/A;  C45 anterior cervical decompression with fusion plating and bonegraft  . COLONOSCOPY    . FEMUR IM NAIL Right 05/23/2016  . FEMUR IM NAIL Right 05/23/2016   Procedure: INTRAMEDULLARY (IM) RETROGRADE FEMORAL NAILING;  Surgeon: [name], MD;  Location: [facility];  Service: Orthopedics;  Laterality: Right;  . KNEE ARTHROPLASTY Left 08/10/2013   Procedure:  TOTAL KNEE ARTHROPLASTY;  Surgeon: [name], MD;  Location: [facility];  Service: Orthopedics;  Laterality: Left;  Left Total Knee Arthroplasty Revision, Cemented, Semi-constrained,   . LEFT AND RIGHT HEART CATHETERIZATION WITH CORONARY ANGIOGRAM N/A 01/07/2012   Procedure: LEFT AND RIGHT HEART CATHETERIZATION WITH CORONARY ANGIOGRAM;  Surgeon: Thayer Headings, MD;  Location: [facility] CATH LAB;  Service: Cardiovascular;  Laterality: N/A;   . TONSILLECTOMY     as a child  . TOTAL KNEE ARTHROPLASTY Bilateral     Family History Family History  Problem Relation Age of Onset  . Heart failure Mother   . Cancer Maternal Grandmother      Social History  reports that she has been smoking cigarettes.  She has a 20.50 pack-year smoking history. She has never used smokeless tobacco. She reports that she drinks alcohol. She reports that she does not use drugs.  Medications  Current Outpatient Medications:  .  albuterol (PROVENTIL HFA;VENTOLIN HFA) 108 (90 BASE) MCG/ACT inhaler, Inhale 2 puffs into the lungs every 6 (six) hours as needed. For shortness of breath or wheezing, Disp: , Rfl:  .  amLODipine (NORVASC) 5 MG tablet, Take 5 mg by mouth daily., Disp: , Rfl:  .  aspirin EC 325 MG tablet, Take 1 tablet (325 mg total) by mouth daily., Disp: 30 tablet, Rfl: 0 .  buPROPion (WELLBUTRIN XL) 150 MG 24 hr tablet, Take 150 mg by mouth daily., Disp: , Rfl:  .  celecoxib (CELEBREX) 200 MG capsule, , Disp: , Rfl:  .  docusate sodium 100 MG CAPS, Take 100 mg by mouth 2 (two) times daily., Disp: 10 capsule, Rfl: 0 .  esomeprazole (NEXIUM) 40 MG capsule, Take 40 mg by mouth daily before breakfast. , Disp: , Rfl:  .  etodolac (LODINE) 400 MG tablet, Take 400 mg by mouth 2 (two) times daily. Reported on 04/15/2015, Disp: , Rfl: 1 .  furosemide (LASIX) 20 MG tablet, Take 1 tablet (20 mg total) by mouth every other day., Disp: 30 tablet, Rfl: 11 .  lisinopril (PRINIVIL,ZESTRIL) 5 MG tablet, Take 1 tablet (5 mg total) by mouth daily., Disp: 30 tablet, Rfl: 0 .  metFORMIN (GLUCOPHAGE) 500 MG tablet, Take 500 mg by mouth 3 (three) times daily., Disp: , Rfl:  .  methocarbamol (ROBAXIN) 500 MG tablet, Take 1 tablet (500 mg total) by mouth every 6 (six) hours as needed for muscle spasms (spasm)., Disp: 60 tablet, Rfl: 0 .  montelukast (SINGULAIR) 10 MG tablet, Take 10 mg by mouth at bedtime., Disp: , Rfl:  .  nicotine (NICODERM CQ - DOSED IN MG/24  HOURS) 21 mg/24hr patch, Place 1 patch onto the skin daily. Uses occasionally, Disp: , Rfl:  .  oxyCODONE-acetaminophen (PERCOCET/ROXICET) 5-325 MG tablet, Take 1-2 tablets by mouth every 4 (four) hours as needed for moderate pain., Disp: 60 tablet, Rfl: 0 .  senna-docusate (SENOKOT-S) 8.6-50 MG per tablet, Take 1 tablet by mouth at bedtime as needed for mild constipation., Disp: , Rfl:   Allergies Patient has no known allergies.  Review of Systems Review of Systems -  Oncology ROS as per HPI otherwise 12 point ROS is negative.   Physical Exam  Vitals Wt Readings from Last 3 Encounters:  07/31/17 168 lb 3.2 oz (76.3 kg)  10/09/16 177 lb (80.3 kg)  08/10/16 198 lb (89.8 kg)   Temp Readings from Last 3 Encounters:  07/31/17 98.2 F (36.8 C) (Oral)  05/28/16 98.9 F (37.2 C) (Oral)  11/18/14 98.5 F (36.9 C) (Oral)   BP Readings from Last 3 Encounters:  07/31/17 (!) 155/68  01/19/17 127/68  10/09/16 (!) 142/74   Pulse Readings from Last 3 Encounters:  07/31/17 80  01/19/17 91  10/09/16 95   Constitutional: Well-developed, well-nourished, and in no distress.   HENT: Head: Normocephalic and atraumatic.  Mouth/Throat: No oropharyngeal exudate. Mucosa moist. Eyes: Pupils are equal, round, and reactive to light. Conjunctivae are normal. No scleral icterus.  Neck: Normal range of motion. Neck supple. No JVD present.  Cardiovascular: Normal rate, regular rhythm and normal heart sounds.  Exam reveals no gallop and no friction rub.   No murmur heard. Pulmonary/Chest: Effort normal.  Coarse BS.  Few wheezes noted.   Abdominal: Soft. Bowel sounds are normal. No distension. There is no tenderness. There is no guarding.  Musculoskeletal: No edema or tenderness. Pt uses walker.   Lymphadenopathy: No cervical, axillary or supraclavicular adenopathy.  Neurological: Alert and oriented to person, place, and time. No cranial nerve deficit.  Skin: Skin is warm and dry. No rash noted. No  erythema. No pallor.  Psychiatric: Affect and judgment normal.   Labs Appointment on 07/31/2017  Component Date Value Ref Range Status  . WBC 07/31/2017 10.8* 4.0 - 10.5 K/uL Final   WHITE COUNT CONFIRMED ON SMEAR  . RBC 07/31/2017 3.62* 3.87 - 5.11 MIL/uL Final  . Hemoglobin 07/31/2017 9.3* 12.0 - 15.0 g/dL Final  . HCT 07/31/2017 30.1* 36.0 - 46.0 % Final  . MCV 07/31/2017 83.1  78.0 - 100.0 fL Final  . MCH 07/31/2017 25.7* 26.0 - 34.0 pg Final  . MCHC 07/31/2017 30.9  30.0 - 36.0 g/dL Final  . RDW 07/31/2017 17.0* 11.5 - 15.5 % Final  . Platelets 07/31/2017 617* 150 - 400 K/uL Final   Comment: SPECIMEN CHECKED FOR CLOTS PLATELET COUNT CONFIRMED BY SMEAR   . Neutrophils Relative % 07/31/2017 59  % Final  . Neutro Abs 07/31/2017 6.4  1.7 - 7.7 K/uL Final  . Lymphocytes Relative 07/31/2017 30  % Final  . Lymphs Abs 07/31/2017 3.3  0.7 - 4.0 K/uL Final  . Monocytes Relative 07/31/2017 5  % Final  . Monocytes Absolute 07/31/2017 0.5  0.1 - 1.0 K/uL Final  . Eosinophils Relative 07/31/2017 5  % Final  . Eosinophils Absolute 07/31/2017 0.5  0.0 - 0.7 K/uL Final  . Basophils Relative 07/31/2017 1  % Final  . Basophils Absolute 07/31/2017 0.1  0.0 - 0.1 K/uL Final   Performed at [facility], [address], [place],  [id number]  . Sodium 07/31/2017 137  135 - 145 mmol/L Final  . Potassium 07/31/2017 4.8  3.5 - 5.1 mmol/L Final  . Chloride 07/31/2017 100* 101 - 111 mmol/L Final  . CO2 07/31/2017 25  22 - 32 mmol/L Final  . Glucose, Bld 07/31/2017 75  65 - 99 mg/dL Final  . BUN 07/31/2017 32* 6 - 20 mg/dL Final  . Creatinine, Ser 07/31/2017 1.46* 0.44 - 1.00 mg/dL Final  . Calcium 07/31/2017 8.7* 8.9 - 10.3 mg/dL Final  . Total Protein 07/31/2017 7.9  6.5 -  8.1 g/dL Final  . Albumin 07/31/2017 3.9  3.5 - 5.0 g/dL Final  . AST 07/31/2017 18  15 - 41 U/L Final  . ALT 07/31/2017 13* 14 - 54 U/L Final  . Alkaline Phosphatase 07/31/2017 96  38 - 126 U/L Final  . Total Bilirubin  07/31/2017 0.6  0.3 - 1.2 mg/dL Final  . GFR calc non Af Amer 07/31/2017 38* >60 mL/min Final  . GFR calc Af Amer 07/31/2017 44* >60 mL/min Final   Comment: (NOTE) The eGFR has been calculated using the CKD EPI equation. This calculation has not been validated in all clinical situations. eGFR's persistently <60 mL/min signify possible Chronic Kidney Disease.   Georgiann Hahn gap 07/31/2017 12  5 - 15 Final   Performed at [facility], [address] [place], [place] [address]  . LDH 07/31/2017 129  98 - 192 U/L Final   Performed at [facility], [address], [place], [place] [address]     Pathology Orders Placed This Encounter  Procedures  . CBC with Differential/Platelet    Standing Status:   Future    Number of Occurrences:   1    Standing Expiration Date:   08/01/2018  . Comprehensive metabolic panel    Standing Status:   Future    Number of Occurrences:   1    Standing Expiration Date:   08/01/2018  . Ferritin    Standing Status:   Future    Number of Occurrences:   1    Standing Expiration Date:   08/01/2018  . Lactate dehydrogenase    Standing Status:   Future    Number of Occurrences:   1    Standing Expiration Date:   08/01/2018  . Folate    Standing Status:   Future    Number of Occurrences:   1    Standing Expiration Date:   07/31/2018  . Vitamin B12    Standing Status:   Future    Number of Occurrences:   1    Standing Expiration Date:   07/31/2018  . Methylmalonic acid(mma), rnd urine  . Hemoglobinopathy evaluation    Standing Status:   Future    Number of Occurrences:   1    Standing Expiration Date:   07/31/2018  . CBC with Differential/Platelet    Standing Status:   Future    Standing Expiration Date:   08/01/2018  . Ferritin    Standing Status:   Future    Standing Expiration Date:   08/01/2018       Zoila Shutter MD

## 2017-07-31 NOTE — Patient Instructions (Signed)
[facility] at [facility]  Discharge Instructions:   Today you saw Dr. Higgs.    _______________________________________________________________  Thank you for choosing [facility] at [facility] to provide your oncology and hematology care.  To afford each patient quality time with our providers, please arrive at least 15 minutes before your scheduled appointment.  You need to re-schedule your appointment if you arrive 10 or more minutes late.  We strive to give you quality time with our providers, and arriving late affects you and other patients whose appointments are after yours.  Also, if you no show three or more times for appointments you may be dismissed from the clinic.  Again, thank you for choosing [facility] at [facility]. Our hope is that these requests will allow you access to exceptional care and in a timely manner. _______________________________________________________________  If you have questions after your visit, please contact our office at [phone] between the hours of 8:30 a.m. and 5:00 p.m. Voicemails left after 4:30 p.m. will not be returned until the following business day. _______________________________________________________________  For prescription refill requests, have your pharmacy contact our office. _______________________________________________________________  Recommendations made by the consultant and any test results will be sent to your referring physician. _______________________________________________________________ 

## 2017-08-01 ENCOUNTER — Encounter (HOSPITAL_COMMUNITY): Payer: Self-pay | Admitting: Internal Medicine

## 2017-08-01 ENCOUNTER — Other Ambulatory Visit (HOSPITAL_COMMUNITY): Payer: Self-pay | Admitting: Internal Medicine

## 2017-08-01 DIAGNOSIS — D509 Iron deficiency anemia, unspecified: Secondary | ICD-10-CM

## 2017-08-01 DIAGNOSIS — D500 Iron deficiency anemia secondary to blood loss (chronic): Secondary | ICD-10-CM

## 2017-08-01 HISTORY — DX: Iron deficiency anemia, unspecified: D50.9

## 2017-08-01 LAB — FOLATE: Folate: 7 ng/mL (ref >5.9)

## 2017-08-02 LAB — HEMOGLOBINOPATHY EVALUATION
Hgb A2 Quant: 1.8 % (ref 1.8–3.2)
Hgb A: 98.2 % (ref 96.4–98.8)
Hgb C: 0 %
Hgb F Quant: 0 % (ref 0.0–2.0)
Hgb S Quant: 0 %
Hgb Variant: 0 %

## 2017-08-08 ENCOUNTER — Inpatient Hospital Stay (HOSPITAL_COMMUNITY): Payer: Medicare Other

## 2017-08-08 ENCOUNTER — Other Ambulatory Visit: Payer: Self-pay

## 2017-08-08 ENCOUNTER — Encounter (HOSPITAL_COMMUNITY): Payer: Self-pay

## 2017-08-08 VITALS — BP 135/75 | HR 84 | Temp 98.1°F | Resp 18

## 2017-08-08 DIAGNOSIS — E119 Type 2 diabetes mellitus without complications: Secondary | ICD-10-CM | POA: Diagnosis not present

## 2017-08-08 DIAGNOSIS — D508 Other iron deficiency anemias: Secondary | ICD-10-CM | POA: Diagnosis not present

## 2017-08-08 DIAGNOSIS — D500 Iron deficiency anemia secondary to blood loss (chronic): Secondary | ICD-10-CM

## 2017-08-08 DIAGNOSIS — I10 Essential (primary) hypertension: Secondary | ICD-10-CM | POA: Diagnosis not present

## 2017-08-08 DIAGNOSIS — D473 Essential (hemorrhagic) thrombocythemia: Secondary | ICD-10-CM | POA: Diagnosis not present

## 2017-08-08 DIAGNOSIS — R918 Other nonspecific abnormal finding of lung field: Secondary | ICD-10-CM | POA: Diagnosis not present

## 2017-08-08 DIAGNOSIS — J449 Chronic obstructive pulmonary disease, unspecified: Secondary | ICD-10-CM | POA: Diagnosis not present

## 2017-08-08 DIAGNOSIS — D72829 Elevated white blood cell count, unspecified: Secondary | ICD-10-CM | POA: Diagnosis not present

## 2017-08-08 DIAGNOSIS — N289 Disorder of kidney and ureter, unspecified: Secondary | ICD-10-CM | POA: Diagnosis not present

## 2017-08-08 MED ORDER — SODIUM CHLORIDE 0.9 % IV SOLN
INTRAVENOUS | Status: DC
  Administered 2017-08-08: 14:00:00 via INTRAVENOUS

## 2017-08-08 MED ORDER — FERRIC CARBOXYMALTOSE 750 MG/15ML IV SOLN
750.0000 mg | Freq: Once | INTRAVENOUS | Status: AC
  Administered 2017-08-08: 750 mg via INTRAVENOUS
  Filled 2017-08-08: qty 15

## 2017-08-08 MED ORDER — CYANOCOBALAMIN 1000 MCG/ML IJ SOLN
1000.0000 ug | INTRAMUSCULAR | Status: DC
  Administered 2017-08-08: 1000 ug via INTRAMUSCULAR
  Filled 2017-08-08: qty 1

## 2017-08-08 NOTE — Progress Notes (Signed)
Tolerated infusion w/o adverse reaction.  Alert, in no distress.  VSS.  Discharged ambulatory.  

## 2017-08-09 LAB — METHYLMALONIC ACID(MMA), RND URINE
Creatinine(Crt), U: 0.25 g/L — ABNORMAL LOW (ref 0.30–3.00)
MMA - Normalized: 0.4 umol/mmol cr — ABNORMAL LOW (ref 0.5–3.4)
Methylmalonic Acid, Ur: 0.8 umol/L — ABNORMAL LOW (ref 1.6–29.7)

## 2017-08-15 ENCOUNTER — Inpatient Hospital Stay (HOSPITAL_COMMUNITY): Payer: Medicare Other | Attending: Hematology

## 2017-08-15 ENCOUNTER — Other Ambulatory Visit: Payer: Self-pay

## 2017-08-15 ENCOUNTER — Encounter (HOSPITAL_COMMUNITY): Payer: Self-pay

## 2017-08-15 VITALS — BP 137/78 | HR 88 | Temp 98.0°F | Resp 18

## 2017-08-15 DIAGNOSIS — E538 Deficiency of other specified B group vitamins: Secondary | ICD-10-CM | POA: Diagnosis not present

## 2017-08-15 DIAGNOSIS — D508 Other iron deficiency anemias: Secondary | ICD-10-CM | POA: Diagnosis not present

## 2017-08-15 DIAGNOSIS — D500 Iron deficiency anemia secondary to blood loss (chronic): Secondary | ICD-10-CM

## 2017-08-15 MED ORDER — CYANOCOBALAMIN 1000 MCG/ML IJ SOLN
1000.0000 ug | INTRAMUSCULAR | Status: DC
  Administered 2017-08-15: 1000 ug via INTRAMUSCULAR
  Filled 2017-08-15: qty 1

## 2017-08-15 MED ORDER — SODIUM CHLORIDE 0.9 % IV SOLN: INTRAVENOUS | Status: DC

## 2017-08-15 MED ORDER — SODIUM CHLORIDE 0.9 % IV SOLN
750.0000 mg | Freq: Once | INTRAVENOUS | Status: AC
  Administered 2017-08-15: 750 mg via INTRAVENOUS
  Filled 2017-08-15: qty 15

## 2017-08-15 NOTE — Progress Notes (Signed)
Debbie Mullins presents today for injection per the provider's orders.  B12 administration without incident; see MAR for injection details.  Patient tolerated procedure well and without incident.  No questions or complaints noted at this time.  Tolerated iron infusion w/o adverse reaction.  Alert, in no distress.  VSS.  Discharged ambulatory in c/o friend.

## 2017-08-22 ENCOUNTER — Encounter (HOSPITAL_COMMUNITY): Payer: Self-pay

## 2017-08-22 ENCOUNTER — Inpatient Hospital Stay (HOSPITAL_COMMUNITY): Payer: Medicare Other

## 2017-08-22 VITALS — BP 139/56 | HR 81 | Temp 98.0°F | Resp 18

## 2017-08-22 DIAGNOSIS — E538 Deficiency of other specified B group vitamins: Secondary | ICD-10-CM

## 2017-08-22 DIAGNOSIS — D508 Other iron deficiency anemias: Secondary | ICD-10-CM | POA: Diagnosis not present

## 2017-08-22 MED ORDER — CYANOCOBALAMIN 1000 MCG/ML IJ SOLN
1000.0000 ug | Freq: Once | INTRAMUSCULAR | Status: AC
  Administered 2017-08-22: 1000 ug via INTRAMUSCULAR
  Filled 2017-08-22: qty 1

## 2017-08-22 NOTE — Patient Instructions (Signed)
[facility]  Discharge Instructions:  You received a b12 shot today.  _______________________________________________________________  Thank you for choosing [facility] at [facility] to provide your oncology and hematology care.  To afford each patient quality time with our providers, please arrive at least 15 minutes before your scheduled appointment.  You need to re-schedule your appointment if you arrive 10 or more minutes late.  We strive to give you quality time with our providers, and arriving late affects you and other patients whose appointments are after yours.  Also, if you no show three or more times for appointments you may be dismissed from the clinic.  Again, thank you for choosing [facility] at [facility]. Our hope is that these requests will allow you access to exceptional care and in a timely manner. _______________________________________________________________  If you have questions after your visit, please contact our office at [phone] between the hours of 8:30 a.m. and 5:00 p.m. Voicemails left after 4:30 p.m. will not be returned until the following business day. _______________________________________________________________  For prescription refill requests, have your pharmacy contact our office. _______________________________________________________________  Recommendations made by the consultant and any test results will be sent to your referring physician. _______________________________________________________________ 

## 2017-08-22 NOTE — Progress Notes (Signed)
Patient tolerated b12 shot with no complaints voiced.  Site clean and dry with no bruising or swelling noted at site.  Band aid applied.  VSS with discharge and left ambulatory with no s/s of distress noted.  

## 2017-08-26 ENCOUNTER — Encounter (HOSPITAL_COMMUNITY): Payer: Self-pay

## 2017-08-26 ENCOUNTER — Other Ambulatory Visit (HOSPITAL_COMMUNITY): Payer: Medicare Other

## 2017-08-26 ENCOUNTER — Inpatient Hospital Stay (HOSPITAL_COMMUNITY): Payer: Medicare Other

## 2017-08-26 VITALS — BP 133/52 | HR 85 | Temp 98.1°F | Resp 18

## 2017-08-26 DIAGNOSIS — E538 Deficiency of other specified B group vitamins: Secondary | ICD-10-CM | POA: Diagnosis not present

## 2017-08-26 DIAGNOSIS — D508 Other iron deficiency anemias: Secondary | ICD-10-CM | POA: Diagnosis not present

## 2017-08-26 DIAGNOSIS — D500 Iron deficiency anemia secondary to blood loss (chronic): Secondary | ICD-10-CM

## 2017-08-26 MED ORDER — CYANOCOBALAMIN 1000 MCG/ML IJ SOLN
1000.0000 ug | Freq: Once | INTRAMUSCULAR | Status: AC
  Administered 2017-08-26: 1000 ug via INTRAMUSCULAR
  Filled 2017-08-26: qty 1

## 2017-08-26 NOTE — Progress Notes (Signed)
Reviewed patients last note with Dr. Walden Field for verbal order for vitamin b12 today and how she wants to proceed.  Give Vitamin b12 injection today verbal order Dr. Walden Field and then set the patient up for monthly injections.    Patient tolerated b12 shot with no complaints voiced.  Site clean and dry with no bruising or swelling noted.  Band aid applied. VSS and left ambulatory with no s/s of distress noted.

## 2017-08-26 NOTE — Patient Instructions (Signed)
[facility] at [facility]  Discharge Instructions:  You received a b12 shot today.  _______________________________________________________________  Thank you for choosing [facility] at [facility] to provide your oncology and hematology care.  To afford each patient quality time with our providers, please arrive at least 15 minutes before your scheduled appointment.  You need to re-schedule your appointment if you arrive 10 or more minutes late.  We strive to give you quality time with our providers, and arriving late affects you and other patients whose appointments are after yours.  Also, if you no show three or more times for appointments you may be dismissed from the clinic.  Again, thank you for choosing [facility] at [facility]. Our hope is that these requests will allow you access to exceptional care and in a timely manner. _______________________________________________________________  If you have questions after your visit, please contact our office at [phone] between the hours of 8:30 a.m. and 5:00 p.m. Voicemails left after 4:30 p.m. will not be returned until the following business day. _______________________________________________________________  For prescription refill requests, have your pharmacy contact our office. _______________________________________________________________  Recommendations made by the consultant and any test results will be sent to your referring physician. _______________________________________________________________ 

## 2017-08-27 ENCOUNTER — Other Ambulatory Visit (HOSPITAL_COMMUNITY): Payer: Self-pay | Admitting: Pharmacist

## 2017-08-28 ENCOUNTER — Ambulatory Visit (HOSPITAL_COMMUNITY): Payer: Medicare Other

## 2017-08-28 ENCOUNTER — Other Ambulatory Visit (HOSPITAL_COMMUNITY): Payer: Medicare Other

## 2017-08-28 ENCOUNTER — Ambulatory Visit (HOSPITAL_COMMUNITY): Payer: Medicare Other | Admitting: Internal Medicine

## 2017-09-30 ENCOUNTER — Other Ambulatory Visit: Payer: Self-pay

## 2017-09-30 ENCOUNTER — Inpatient Hospital Stay (HOSPITAL_COMMUNITY): Payer: Medicare Other | Attending: Hematology

## 2017-09-30 ENCOUNTER — Encounter (HOSPITAL_COMMUNITY): Payer: Self-pay

## 2017-09-30 ENCOUNTER — Inpatient Hospital Stay (HOSPITAL_COMMUNITY): Payer: Medicare Other

## 2017-09-30 DIAGNOSIS — I10 Essential (primary) hypertension: Secondary | ICD-10-CM | POA: Diagnosis not present

## 2017-09-30 DIAGNOSIS — J449 Chronic obstructive pulmonary disease, unspecified: Secondary | ICD-10-CM | POA: Insufficient documentation

## 2017-09-30 DIAGNOSIS — R918 Other nonspecific abnormal finding of lung field: Secondary | ICD-10-CM | POA: Diagnosis not present

## 2017-09-30 DIAGNOSIS — N289 Disorder of kidney and ureter, unspecified: Secondary | ICD-10-CM | POA: Diagnosis not present

## 2017-09-30 DIAGNOSIS — D508 Other iron deficiency anemias: Secondary | ICD-10-CM

## 2017-09-30 DIAGNOSIS — F17210 Nicotine dependence, cigarettes, uncomplicated: Secondary | ICD-10-CM | POA: Diagnosis not present

## 2017-09-30 DIAGNOSIS — D500 Iron deficiency anemia secondary to blood loss (chronic): Secondary | ICD-10-CM | POA: Insufficient documentation

## 2017-09-30 DIAGNOSIS — Z809 Family history of malignant neoplasm, unspecified: Secondary | ICD-10-CM | POA: Diagnosis not present

## 2017-09-30 DIAGNOSIS — D72829 Elevated white blood cell count, unspecified: Secondary | ICD-10-CM | POA: Insufficient documentation

## 2017-09-30 LAB — CBC WITH DIFFERENTIAL/PLATELET
Basophils Absolute: 0.1 10*3/uL (ref 0.0–0.1)
Basophils Relative: 1 %
Eosinophils Absolute: 0.5 10*3/uL (ref 0.0–0.7)
Eosinophils Relative: 5 %
HCT: 39.2 % (ref 36.0–46.0)
Hemoglobin: 12.9 g/dL (ref 12.0–15.0)
Lymphocytes Relative: 34 %
Lymphs Abs: 3.5 10*3/uL (ref 0.7–4.0)
MCH: 30.4 pg (ref 26.0–34.0)
MCHC: 32.9 g/dL (ref 30.0–36.0)
MCV: 92.5 fL (ref 78.0–100.0)
Monocytes Absolute: 0.4 10*3/uL (ref 0.1–1.0)
Monocytes Relative: 4 %
Neutro Abs: 5.8 10*3/uL (ref 1.7–7.7)
Neutrophils Relative %: 56 %
Platelets: 387 10*3/uL (ref 150–400)
RBC: 4.24 MIL/uL (ref 3.87–5.11)
RDW: 21.1 % — ABNORMAL HIGH (ref 11.5–15.5)
WBC: 10.2 10*3/uL (ref 4.0–10.5)

## 2017-09-30 LAB — FERRITIN: Ferritin: 191 ng/mL (ref 11–307)

## 2017-09-30 MED ORDER — CYANOCOBALAMIN 1000 MCG/ML IJ SOLN
1000.0000 ug | Freq: Once | INTRAMUSCULAR | Status: AC
  Administered 2017-09-30: 1000 ug via INTRAMUSCULAR

## 2017-09-30 MED ORDER — CYANOCOBALAMIN 1000 MCG/ML IJ SOLN
INTRAMUSCULAR | Status: AC
  Filled 2017-09-30: qty 1

## 2017-09-30 NOTE — Progress Notes (Signed)
Debbie Mullins presents today for injection per the provider's orders.  B12 administration without incident; see MAR for injection details.  Patient tolerated procedure well and without incident.  No questions or complaints noted at this time.  Discharged ambulatory.

## 2017-10-03 ENCOUNTER — Inpatient Hospital Stay (HOSPITAL_BASED_OUTPATIENT_CLINIC_OR_DEPARTMENT_OTHER): Payer: Medicare Other | Admitting: Internal Medicine

## 2017-10-03 ENCOUNTER — Encounter (HOSPITAL_COMMUNITY): Payer: Self-pay | Admitting: Internal Medicine

## 2017-10-03 VITALS — BP 145/95 | HR 101 | Temp 98.0°F | Resp 18 | Wt 166.6 lb

## 2017-10-03 DIAGNOSIS — D500 Iron deficiency anemia secondary to blood loss (chronic): Secondary | ICD-10-CM

## 2017-10-03 DIAGNOSIS — Z720 Tobacco use: Secondary | ICD-10-CM

## 2017-10-03 DIAGNOSIS — F17210 Nicotine dependence, cigarettes, uncomplicated: Secondary | ICD-10-CM

## 2017-10-03 DIAGNOSIS — N289 Disorder of kidney and ureter, unspecified: Secondary | ICD-10-CM | POA: Diagnosis not present

## 2017-10-03 DIAGNOSIS — R918 Other nonspecific abnormal finding of lung field: Secondary | ICD-10-CM | POA: Diagnosis not present

## 2017-10-03 DIAGNOSIS — I10 Essential (primary) hypertension: Secondary | ICD-10-CM | POA: Diagnosis not present

## 2017-10-03 DIAGNOSIS — Z809 Family history of malignant neoplasm, unspecified: Secondary | ICD-10-CM | POA: Diagnosis not present

## 2017-10-03 DIAGNOSIS — D72829 Elevated white blood cell count, unspecified: Secondary | ICD-10-CM

## 2017-10-03 DIAGNOSIS — J449 Chronic obstructive pulmonary disease, unspecified: Secondary | ICD-10-CM | POA: Diagnosis not present

## 2017-10-03 DIAGNOSIS — E119 Type 2 diabetes mellitus without complications: Secondary | ICD-10-CM

## 2017-10-03 NOTE — Patient Instructions (Signed)
[facility] Discharge Instructions  You saw Dr. Higgs today.   Thank you for choosing [facility] at [facility] to provide your oncology and hematology care.  To afford each patient quality time with our provider, please arrive at least 15 minutes before your scheduled appointment time.   If you have a lab appointment with the Cancer Center please come in thru the  Main Entrance and check in at the main information desk  You need to re-schedule your appointment should you arrive 10 or more minutes late.  We strive to give you quality time with our providers, and arriving late affects you and other patients whose appointments are after yours.  Also, if you no show three or more times for appointments you may be dismissed from the clinic at the providers discretion.     Again, thank you for choosing [facility].  Our hope is that these requests will decrease the amount of time that you wait before being seen by our physicians.       _____________________________________________________________  Should you have questions after your visit to [facility], please contact our office at [phone] between the hours of 8:30 a.m. and 4:30 p.m.  Voicemails left after 4:30 p.m. will not be returned until the following business day.  For prescription refill requests, have your pharmacy contact our office.       Resources For Cancer Patients and their Caregivers ? American Cancer Society: Can assist with transportation, wigs, general needs, runs Look Good Feel Better.        1-888-227-6333 ? Cancer Care: Provides financial assistance, online support groups, medication/co-pay assistance.  1-800-813-HOPE (4673) ? [facility] Assists [place] cancer patients and their families through emotional , educational and financial support.  336-427-4357 ? [place] DSS Where to apply for food  stamps, Medicaid and utility assistance. 336-342-1394 ? [facility]: Transportation to medical appointments. 336-347-2287 ? Social Security Administration: May apply for disability if have a Stage IV cancer. 336-342-7796 1-800-772-1213 ? [place] Aging, Disability and Transit Services: Assists with nutrition, care and transit needs. 336-349-2343  Cancer Center Support Programs:   > Cancer Support Group  2nd Tuesday of the month 1pm-2pm, Journey Room   > Creative Journey  3rd Tuesday of the month 1130am-1pm, Journey Room     

## 2017-10-03 NOTE — Progress Notes (Signed)
Diagnosis Iron deficiency anemia due to chronic blood loss - Plan: CBC with Differential/Platelet, Comprehensive metabolic panel, Lactate dehydrogenase, Ferritin  Abnormal findings on diagnostic imaging of lung - Plan: CBC with Differential/Platelet, Comprehensive metabolic panel, Lactate dehydrogenase, Ferritin, CANCELED: CT CHEST NODULE FOLLOW UP LOW DOSE WO CM  Tobacco use - Plan: CT CHEST LUNG CA SCREEN LOW DOSE W/O CM  Staging Cancer Staging No matching staging information was found for the patient.  Assessment and Plan:  1.  IDA.  She was previously seen by Dr. Tressie Stalker for initial evaluation January 02, 2011.  She was last seen by him Jul 21, 2012 Per his note  #1 leukocytosis, it is felt to be benign, it is not progressing. And suspected to be reactive  #2 COPD with a 40 pack year history smoking, pulmonary nodules which are stable but she needs a repeat CT scan in one year.  She had labs done March 13, 2017 that showed white count 9.2 hemoglobin 9.8 hematocrit 30.7 platelets 460,000 she had a normal differential.  Ferritin was 11 folate was 4.4.  12 was normal.  Chemistries were within normal limits other than a creatinine of 1.62 glucose was 57 potassium was 6.  She denies any blood in her stool or her urine.  She reportedly had a colonoscopy 5 years ago.  She reports some fatigue.  She continues to smoke a half a pack per day.  Labs done 07/31/2017 showed Hb 9.3 with ferritin of 10.  Pt was last treated with IV iron on 08/15/2017 due to an intolerance of oral iron.      Labs done 09/30/2017 reviewed with pt and showed WBC 10.2 HB 12.9 plts 387,000.  Ferritin is improved at 191.  Pt is referred to GI for evaluation.  She will RTC 02/2018 for follow-up and repeat labs.   2.  Thrombocytosis.  Reactive due to anemia.  Labs done 09/30/2017 show Plt count is WNL at 387,000.  Will repeat labs in 12.2019.    3.  Pulmonary nodules.  This reportedly was discussed with the patient during her  last evaluation with Dr. Tressie Stalker.  Last CT chest was done 07/09/2012 and showed 1.  Scattered small pulmonary nodules, at least two of which are not well seen on the prior examinations, possibly due to respiratory motion. Given risk factors for bronchogenic carcinoma, follow-up chest CT at 6 - 12 months is recommended.  This recommendation follows the consensus statement: Guidelines for Management of Small Pulmonary Nodules Detected on CT Scans: A Statement from the [facility] as published in Radiology 2005; 237:395-400.  Pt continues to smoke and will be set up for low dose CT for screening. She will be notified of results and if additional imaging is recommended.    4.  Hypertension.  BP 145/95.  Follow-up with PCP.    5.  Renal insufficiency.  Creatinine 1.46 on recent labs.  Will check SPEP on RTC.    6.  Smoking.  Cessation is recommended.  She is set up for lung cancer screening CT as she has a 30 yr history of smoking.    Greater than 35 minutes spent with more than 50% spent in counseling and coordination of care.    Interval History:  Historical data obtained from the note dated 07/31/2017.  62 year old female referred by Dr. Criss Rosales for evaluation of anemia.  She had previously been seen by Dr. Tressie Stalker for initial evaluation January 02, 2011.  She was last seen by him Jul 21, 2012 For per his note  #1 leukocytosis, it is felt to be benign, it is not progressing. And suspected to be reactive  #2 COPD with a 40 pack year history smoking, pulmonary nodules which are stable but she needs a repeat CT scan in one year.  She had labs done March 13, 2017 that showed white count 9.2 hemoglobin 9.8 hematocrit 30.7 platelets 460,000 she had a normal differential.  Ferritin was 11 folate was 4.4.  12 was normal.  Chemistries were within normal limits other than a creatinine of 1.62 glucose was 57 potassium was 6.  She denies any blood in her stool or her urine.  She reportedly had a  colonoscopy 5 years ago.  She reports some fatigue.  She continues to smoke a half a pack per day.    Current Status:  Pt is seen today for follow-up.  Pt is doing well after IV iron.  She continues to smoke.      Problem List Patient Active Problem List   Diagnosis Date Noted  . Iron deficiency anemia [D50.9] 08/01/2017  . Hyperkalemia [E87.5] 05/24/2016  . Displaced supracondylar fracture without intracondylar extension of lower end of right femur, initial encounter for closed fracture ([place]) [S72.451A] 05/23/2016  . 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 [M97.11XA]   . Closed fracture of right distal femur ([place]) [S72.401A]   . 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 Pulmonary nodules/lesions, multiple [R91.8] 08/09/2013  . HLD (hyperlipidemia) [E78.5] 01/22/2012  . Pulmonary hypertension ([place]) [I27.20] 01/08/2012  . Acute on chronic diastolic heart failure ([place]) [I50.33] 01/05/2012  . Acute respiratory failure with hypoxia ([place]) [J96.01] 01/03/2012  . Pulmonary edema, acute ([place]) [J81.0] 01/03/2012  . 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 HTN (hypertension) [I10] 01/02/2012  . COPD with acute exacerbation ([place]) [J44.1] 01/02/2012  . Tobacco abuse [Z72.0] 07/04/2011  . 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 Leukocytosis [D72.829] 02/09/2011  . Normocytic anemia [D64.9] 02/09/2011  . Mild Thrombocytosis [D47.3] 02/09/2011  . COPD (chronic obstructive pulmonary disease) ([place]) [J44.9] 02/09/2011  . Obesity [E66.9] 02/09/2011    Past Medical History Past Medical History:  Diagnosis Date  . Anemia   . Asthma   . CHF (congestive heart failure) (HCC)    takes Furosemide daily   . COPD (chronic obstructive pulmonary disease) (HCC)    Albuterol inhaler prn;SIngulair at night  . Depression    takes Wellbutrin  daily  . Diabetes mellitus    takes Metformin daily  . GERD (gastroesophageal reflux disease)    takes Nexium daily  . History of colon polyps    benign  . Hypertension    takes Lisinopril daily  . Iron deficiency anemia 08/01/2017  . Joint pain   . Joint swelling   . Leukocytosis 02/09/2011  . Neck pain    HNP  . Normocytic anemia 02/09/2011  . Peripheral edema    takes Furosemide daily  . Pneumonia    many yrs ago  . Pulmonary nodules/lesions, multiple 08/09/2013  . Shortness of breath dyspnea    with exertion  . Thrombocytosis ([place]) 02/09/2011  . Urinary frequency   . Urinary urgency   . Weakness    numbness and tingling in both hands    Past Surgical History Past Surgical History:  Procedure Laterality Date  . ANTERIOR CERVICAL DECOMP/DISCECTOMY FUSION N/A 11/17/2014   Procedure:  Cervical four- cervical five Anterior Cervical Decompression with fusion and bonegraft;  Surgeon: Ashok Pall, MD;  Location: [facility];  Service: Neurosurgery;  Laterality: N/A;  C45 anterior cervical decompression with fusion plating and bonegraft  . COLONOSCOPY    . FEMUR IM NAIL Right 05/23/2016  . FEMUR IM NAIL Right 05/23/2016   Procedure: INTRAMEDULLARY (IM) RETROGRADE FEMORAL NAILING;  Surgeon: [name], MD;  Location: [facility];  Service: Orthopedics;  Laterality: Right;  . KNEE ARTHROPLASTY Left 08/10/2013   Procedure:  TOTAL KNEE ARTHROPLASTY;  Surgeon: [name], MD;  Location: [facility];  Service: Orthopedics;  Laterality: Left;  Left Total Knee Arthroplasty Revision, Cemented, Semi-constrained,   . LEFT AND RIGHT HEART CATHETERIZATION WITH CORONARY ANGIOGRAM N/A 01/07/2012   Procedure: LEFT AND RIGHT HEART CATHETERIZATION WITH CORONARY ANGIOGRAM;  Surgeon: Thayer Headings, MD;  Location: [facility] CATH LAB;  Service: Cardiovascular;  Laterality: N/A;  . TONSILLECTOMY     as a child  . TOTAL KNEE ARTHROPLASTY Bilateral     Family History Family History  Problem Relation Age of Onset   . Heart failure Mother   . Cancer Maternal Grandmother      Social History  reports that she has been smoking cigarettes.  She has a 20.50 pack-year smoking history. She has never used smokeless tobacco. She reports that she drinks alcohol. She reports that she does not use drugs.  Medications  Current Outpatient Medications:  .  albuterol (PROVENTIL HFA;VENTOLIN HFA) 108 (90 BASE) MCG/ACT inhaler, Inhale 2 puffs into the lungs every 6 (six) hours as needed. For shortness of breath or wheezing, Disp: , Rfl:  .  amLODipine (NORVASC) 5 MG tablet, Take 5 mg by mouth daily., Disp: , Rfl:  .  aspirin EC 325 MG tablet, Take 1 tablet (325 mg total) by mouth daily., Disp: 30 tablet, Rfl: 0 .  buPROPion (WELLBUTRIN XL) 150 MG 24 hr tablet, Take 150 mg by mouth daily., Disp: , Rfl:  .  celecoxib (CELEBREX) 200 MG capsule, , Disp: , Rfl:  .  docusate sodium 100 MG CAPS, Take 100 mg by mouth 2 (two) times daily., Disp: 10 capsule, Rfl: 0 .  esomeprazole (NEXIUM) 40 MG capsule, Take 40 mg by mouth daily before breakfast. , Disp: , Rfl:  .  etodolac (LODINE) 400 MG tablet, Take 400 mg by mouth 2 (two) times daily. Reported on 04/15/2015, Disp: , Rfl: 1 .  furosemide (LASIX) 20 MG tablet, Take 1 tablet (20 mg total) by mouth every other day., Disp: 30 tablet, Rfl: 11 .  metFORMIN (GLUCOPHAGE) 500 MG tablet, Take 500 mg by mouth 3 (three) times daily., Disp: , Rfl:  .  methocarbamol (ROBAXIN) 500 MG tablet, Take 1 tablet (500 mg total) by mouth every 6 (six) hours as needed for muscle spasms (spasm)., Disp: 60 tablet, Rfl: 0 .  montelukast (SINGULAIR) 10 MG tablet, Take 10 mg by mouth at bedtime., Disp: , Rfl:  .  nicotine (NICODERM CQ - DOSED IN MG/24 HOURS) 21 mg/24hr patch, Place 1 patch onto the skin daily. Uses occasionally, Disp: , Rfl:  .  senna-docusate (SENOKOT-S) 8.6-50 MG per tablet, Take 1 tablet by mouth at bedtime as needed for mild constipation., Disp: , Rfl:   Allergies Patient has no known  allergies.  Review of Systems Review of Systems - Oncology ROS negative   Physical Exam  Vitals Wt Readings from Last 3 Encounters:  10/03/17 166 lb 9.6 oz (75.6 kg)  07/31/17 168  lb 3.2 oz (76.3 kg)  10/09/16 177 lb (80.3 kg)   Temp Readings from Last 3 Encounters:  10/03/17 98 F (36.7 C) (Oral)  09/30/17 97.8 F (36.6 C) (Oral)  08/26/17 98.1 F (36.7 C) (Oral)   BP Readings from Last 3 Encounters:  10/03/17 (!) 145/95  09/30/17 (!) 128/58  08/26/17 (!) 133/52   Pulse Readings from Last 3 Encounters:  10/03/17 (!) 101  09/30/17 84  08/26/17 85   Constitutional: Well-developed, well-nourished, and in no distress.   HENT: Head: Normocephalic and atraumatic.  Mouth/Throat: No oropharyngeal exudate. Mucosa moist. Eyes: Pupils are equal, round, and reactive to light. Conjunctivae are normal. No scleral icterus.  Neck: Normal range of motion. Neck supple. No JVD present.  Cardiovascular: Normal rate, regular rhythm and normal heart sounds.  Exam reveals no gallop and no friction rub.   No murmur heard. Pulmonary/Chest: Effort normal and breath sounds normal. No respiratory distress. No wheezes.No rales.  Abdominal: Soft. Bowel sounds are normal. No distension. There is no tenderness. There is no guarding.  Musculoskeletal: No edema or tenderness.  Lymphadenopathy: No cervical, axillary or supraclavicular adenopathy.  Neurological: Alert and oriented to person, place, and time. No cranial nerve deficit.  Skin: Skin is warm and dry. No rash noted. No erythema. No pallor.  Psychiatric: Affect and judgment normal.   Labs No visits with results within 3 Day(s) from this visit.  Latest known visit with results is:  Appointment on 09/30/2017  Component Date Value Ref Range Status  . WBC 09/30/2017 10.2  4.0 - 10.5 K/uL Final  . RBC 09/30/2017 4.24  3.87 - 5.11 MIL/uL Final  . Hemoglobin 09/30/2017 12.9  12.0 - 15.0 g/dL Final  . HCT 09/30/2017 39.2  36.0 - 46.0 % Final   . MCV 09/30/2017 92.5  78.0 - 100.0 fL Final  . MCH 09/30/2017 30.4  26.0 - 34.0 pg Final  . MCHC 09/30/2017 32.9  30.0 - 36.0 g/dL Final  . RDW 09/30/2017 21.1* 11.5 - 15.5 % Final  . Platelets 09/30/2017 387  150 - 400 K/uL Final  . Neutrophils Relative % 09/30/2017 56  % Final  . Neutro Abs 09/30/2017 5.8  1.7 - 7.7 K/uL Final  . Lymphocytes Relative 09/30/2017 34  % Final  . Lymphs Abs 09/30/2017 3.5  0.7 - 4.0 K/uL Final  . Monocytes Relative 09/30/2017 4  % Final  . Monocytes Absolute 09/30/2017 0.4  0.1 - 1.0 K/uL Final  . Eosinophils Relative 09/30/2017 5  % Final  . Eosinophils Absolute 09/30/2017 0.5  0.0 - 0.7 K/uL Final  . Basophils Relative 09/30/2017 1  % Final  . Basophils Absolute 09/30/2017 0.1  0.0 - 0.1 K/uL Final   Performed at [facility], [address] [place], [place] [address]  . Ferritin 09/30/2017 191  11 - 307 ng/mL Final   Performed at [facility], [place] [address], [place], [place] [address]     Pathology Orders Placed This Encounter  Procedures  . CT CHEST LUNG CA SCREEN LOW DOSE W/O CM    Standing Status:   Future    Standing Expiration Date:   12/05/2018    Order Specific Question:   Reason for Exam (SYMPTOM  OR DIAGNOSIS REQUIRED)    Answer:   smoking    Order Specific Question:   Preferred Imaging Location?    Answer:   [facility]    Order Specific Question:   Is patient pregnant?  Answer:   No    Order Specific Question:   Radiology Contrast Protocol - do NOT remove file path    Answer:   [email or web address]  . CBC with Differential/Platelet    Standing Status:   Future    Standing Expiration Date:   10/04/2018  . Comprehensive metabolic panel    Standing Status:   Future    Standing Expiration Date:   10/04/2018  . Lactate dehydrogenase    Standing Status:   Future    Standing Expiration Date:   10/04/2018  . Ferritin    Standing Status:   Future    Standing Expiration Date:   10/04/2018        Zoila Shutter MD

## 2017-10-07 ENCOUNTER — Encounter: Payer: Self-pay | Admitting: Gastroenterology

## 2017-10-18 ENCOUNTER — Ambulatory Visit (HOSPITAL_COMMUNITY): Payer: Medicare Other

## 2017-10-23 ENCOUNTER — Ambulatory Visit (HOSPITAL_COMMUNITY)
Admission: RE | Admit: 2017-10-23 | Discharge: 2017-10-23 | Disposition: A | Discharge status: Home / Self Care | Payer: Medicare Other | Source: Ambulatory Visit | Attending: Internal Medicine | Admitting: Internal Medicine

## 2017-10-23 DIAGNOSIS — I2510 Atherosclerotic heart disease of native coronary artery without angina pectoris: Secondary | ICD-10-CM | POA: Diagnosis not present

## 2017-10-23 DIAGNOSIS — I10 Essential (primary) hypertension: Secondary | ICD-10-CM | POA: Diagnosis not present

## 2017-10-23 DIAGNOSIS — Z720 Tobacco use: Secondary | ICD-10-CM | POA: Diagnosis not present

## 2017-10-23 DIAGNOSIS — K8020 Calculus of gallbladder without cholecystitis without obstruction: Secondary | ICD-10-CM | POA: Diagnosis not present

## 2017-10-23 DIAGNOSIS — I700 Atherosclerosis of aorta: Secondary | ICD-10-CM | POA: Insufficient documentation

## 2017-10-23 DIAGNOSIS — Z122 Encounter for screening for malignant neoplasm of respiratory organs: Secondary | ICD-10-CM | POA: Insufficient documentation

## 2017-11-22 DIAGNOSIS — D649 Anemia, unspecified: Secondary | ICD-10-CM | POA: Diagnosis not present

## 2017-11-22 DIAGNOSIS — I10 Essential (primary) hypertension: Secondary | ICD-10-CM | POA: Diagnosis not present

## 2017-11-22 DIAGNOSIS — J441 Chronic obstructive pulmonary disease with (acute) exacerbation: Secondary | ICD-10-CM | POA: Diagnosis not present

## 2017-12-25 ENCOUNTER — Ambulatory Visit: Payer: Medicare Other | Admitting: Gastroenterology

## 2017-12-25 ENCOUNTER — Other Ambulatory Visit: Payer: Self-pay

## 2017-12-25 ENCOUNTER — Encounter: Payer: Self-pay | Admitting: Gastroenterology

## 2017-12-25 DIAGNOSIS — D509 Iron deficiency anemia, unspecified: Secondary | ICD-10-CM

## 2017-12-25 MED ORDER — NA SULFATE-K SULFATE-MG SULF 17.5-3.13-1.6 GM/177ML PO SOLN: 1.0000 | ORAL | 0 refills | Status: DC

## 2017-12-25 NOTE — Assessment & Plan Note (Addendum)
SYMPTOMS NOT CONTROLLED. NO BRBPR OR MELENA. DIFFERENTIAL DIAGNOSIS INCLUDES: COLON POLYPS, AVMs, H PYLORI GASTRITIS, CAMERON'S EROSIONS,  ATROPHIC GASTRITIS, CELIAC DISEASE, OR LESS LIKELY COLON CANCER.   COMPLETE COLONOSCOPY AND UPPER ENDOSCOPY WITHIN THE NEXT 2-3 WEEKS. ENEMA TO ADMINISTER IN THE PREOP AREA. CONTINUE GLUCOPHAGE & HOLD LASIX ON DAY OF COLONOSCOPY. DISCUSSED PROCEDURE, BENEFITS, & RISKS: < 1% chance of medication reaction, bleeding, perforation, or rupture of spleen/liver. CONSIDER GIVENS CAPSULE ENDOSCOPY. THYROID TEST ON THE DAY OF YOUR ENDOSCOPY. FOLLOW UP IN THE OFFICE WILL BE SCHEDULED IF NEEDED AFTER ENDOSCOPY.

## 2017-12-25 NOTE — Patient Instructions (Addendum)
COMPLETE COLONOSCOPY AND UPPER ENDOSCOPY WITHIN THE NEXT 2-3 WEEKS. YOU MAY BRING THE ENEMA TO ADMINISTER IN THE PREOP AREA. CONTINUE GLUCOPHAGE ON DAY OF COLONOSCOPY.  YOU NEED A THYROID TEST ON THE DAY OF YOUR ENDOSCOPY.  FOLLOW UP IN THE OFFICE WILL BE SCHEDULED IF NEEDED AFTER ENDOSCOPY.

## 2017-12-25 NOTE — Progress Notes (Signed)
Subjective:    Patient ID: Debbie Mullins, female    DOB: October 24, 1955, 62 y.o.   MRN: [id number]  Lucianne Lei, MD   HPI 1-2X/MO ETOH. SMOKES 1/2 PPD. NO TROUBLE WITH LAST TCS. BMs: 1X/DAY. NO BOWEL OBSTRUCTION OR SURGERIES. WEIGHT LOSS: NWG(9562-130 LBS AND TODAY 171 LBS) APPETITE: GETTING BETTER BUT HALF AS MUCH AS IT WAS. GOT TO PAIN WHEN SHE WASN'T HUNGRY. DENIES ANXIETY OR DEPRESSION OR FOOD INSECURITY. DENTURES FIT GOOD EVEN WITH WEIGHT LOSS.  PT DENIES FEVER, CHILLS, HEMATOCHEZIA, HEMATEMESIS, nausea, vomiting, melena, diarrhea, CHEST PAIN, SHORTNESS OF BREATH,  CHANGE IN BOWEL IN HABITS, constipation, abdominal pain, problems swallowing, problems with sedation, OR heartburn or indigestion.  Past Medical History:  Diagnosis Date  . Anemia   . Asthma   . CHF (congestive heart failure) (HCC)    takes Furosemide daily   . COPD (chronic obstructive pulmonary disease) (HCC)    Albuterol inhaler prn;SIngulair at night  . Depression    takes Wellbutrin daily  . Diabetes mellitus    takes Metformin daily  . GERD (gastroesophageal reflux disease)    takes Nexium daily  . History of colon polyps    benign  . Hypertension    takes Lisinopril daily  . Iron deficiency anemia 08/01/2017  . Joint pain   . Joint swelling   . Leukocytosis 02/09/2011  . Neck pain    HNP  . Normocytic anemia 02/09/2011  . Peripheral edema    takes Furosemide daily  . Pneumonia    many yrs ago  . Pulmonary nodules/lesions, multiple 08/09/2013  . Shortness of breath dyspnea    with exertion  . Thrombocytosis ([place]) 02/09/2011  . Urinary frequency   . Urinary urgency   . Weakness    numbness and tingling in both hands   Past Surgical History:  Procedure Laterality Date  . ANTERIOR CERVICAL DECOMP/DISCECTOMY FUSION N/A 11/17/2014   Procedure: Cervical four- cervical five Anterior Cervical Decompression with fusion and bonegraft;  Surgeon: Ashok Pall, MD;  Location: [place] [facility];  Service:  Neurosurgery;  Laterality: N/A;  C45 anterior cervical decompression with fusion plating and bonegraft  . COLONOSCOPY    . FEMUR IM NAIL Right 05/23/2016  . FEMUR IM NAIL Right 05/23/2016   Procedure: INTRAMEDULLARY (IM) RETROGRADE FEMORAL NAILING;  Surgeon: [name], MD;  Location: [place];  Service: Orthopedics;  Laterality: Right;  . KNEE ARTHROPLASTY Left 08/10/2013   Procedure:  TOTAL KNEE ARTHROPLASTY;  Surgeon: [name], MD;  Location: [place];  Service: Orthopedics;  Laterality: Left;  Left Total Knee Arthroplasty Revision, Cemented, Semi-constrained,   . LEFT AND RIGHT HEART CATHETERIZATION WITH CORONARY ANGIOGRAM N/A 01/07/2012   Procedure: LEFT AND RIGHT HEART CATHETERIZATION WITH CORONARY ANGIOGRAM;  Surgeon: Thayer Headings, MD;  Location: [facility];  Service: Cardiovascular;  Laterality: N/A;  . TONSILLECTOMY     as a child  . TOTAL KNEE ARTHROPLASTY Bilateral    No Known Allergies  Current Outpatient Medications  Medication Sig    . albuterol (PROVENTIL HFA;VENTOLIN HFA) 108 (90 BASE) MCG/ACT inhaler Inhale 2 puffs into the lungs every 6 (six) hours as needed. For shortness of breath or wheezing    . amLODipine (NORVASC) 5 MG tablet Take 5 mg by mouth daily.    Marland Kitchen aspirin EC 325 MG tablet Take 1 tablet (325 mg total) by mouth daily.    Marland Kitchen buPROPion (WELLBUTRIN XL) 150 MG 24 hr tablet Take 150 mg by mouth daily.    Marland Kitchen  celecoxib (CELEBREX) 200 MG capsule Take 200 mg by mouth daily.     Marland Kitchen esomeprazole (NEXIUM) 40 MG capsule Take 40 mg by mouth daily before breakfast.     . furosemide (LASIX) 20 MG tablet Take 20 mg by mouth daily.    . metFORMIN (GLUCOPHAGE) 500 MG tablet Take 500 mg by mouth 3 (three) times daily.    . montelukast (SINGULAIR) 10 MG tablet Take 10 mg by mouth at bedtime.    .      . etodolac (LODINE) 400 MG tablet Take 400 mg by mouth daily. Reported on 04/15/2015    .      Marland Kitchen nicotine (NICODERM CQ - DOSED IN MG/24 HOURS) 21 mg/24hr patch Place 1 patch onto the  skin daily. Uses occasionally    .       Family History  Problem Relation Age of Onset  . Heart failure Mother   . Cancer Maternal Grandmother   . Colon cancer Neg Hx   . Colon polyps Neg Hx    Social History   Tobacco Use  . Smoking status: Current Every Day Smoker    Packs/day: 0.50    Years: 41.00    Pack years: 20.50    Types: Cigarettes  . Smokeless tobacco: Never Used  Substance Use Topics  . Alcohol use: Yes    Comment: wine  . Drug use: No   Review of Systems PER HPI OTHERWISE ALL SYSTEMS ARE NEGATIVE.    Objective:   Physical Exam  Constitutional: She is oriented to person, place, and time. She appears well-developed and well-nourished. No distress.  HENT:  Head: Normocephalic and atraumatic.  Mouth/Throat: Oropharynx is clear and moist. No oropharyngeal exudate.  Eyes: Pupils are equal, round, and reactive to light. No scleral icterus.  Neck: Normal range of motion. Neck supple.  Cardiovascular: Normal rate, regular rhythm and normal heart sounds.  Pulmonary/Chest: Effort normal and breath sounds normal. No respiratory distress.  Abdominal: Soft. Bowel sounds are normal. She exhibits no distension. There is no tenderness.  EXAM LIMITED-PT IN WALKER/CHAIR   Musculoskeletal: She exhibits no edema.  Lymphadenopathy:    She has no cervical adenopathy.  Neurological: She is alert and oriented to person, place, and time.  NO  NEW FOCAL DEFICITS  Psychiatric: She has a normal mood and affect.  Vitals reviewed.     Assessment & Plan:

## 2017-12-25 NOTE — Progress Notes (Signed)
CC'D TO PCP °

## 2018-02-03 IMAGING — CT CT KNEE*R* W/O CM
2 of 6 series · 14 of 34 positions shown, 16 images · non-contrast
Comparison: Plain films right knee this same day.

CLINICAL DATA: Status post fall yesterday with onset of right knee
pain. Right knee fracture by plain films this same day. Initial
encounter.

EXAM:
CT OF THE RIGHT KNEE WITHOUT CONTRAST
TECHNIQUE: Multidetector CT imaging of the right knee was performed according
to the standard protocol. Multiplanar CT image reconstructions were
also generated.

[Series 7: axial st · axial · 0.39mm/px · z∈[-872,-719]mm · 8 of 183 slices shown, 10 images]
[im 15/183  soft-tissue]
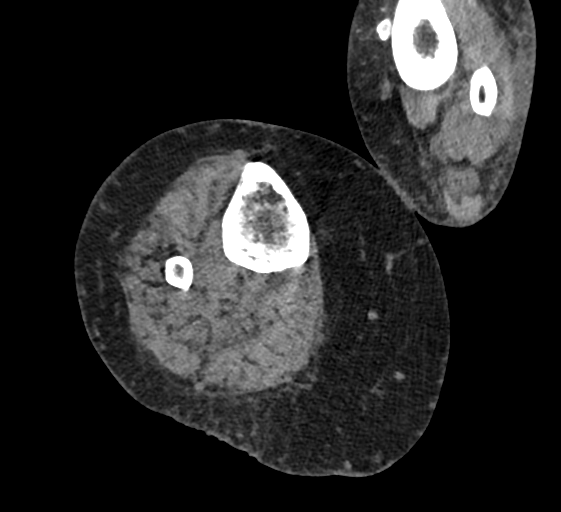
[im 15/183  bone]
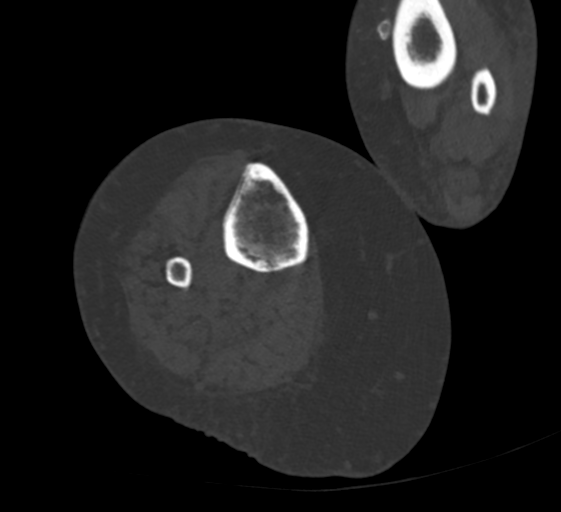
[im 43/183  bone]
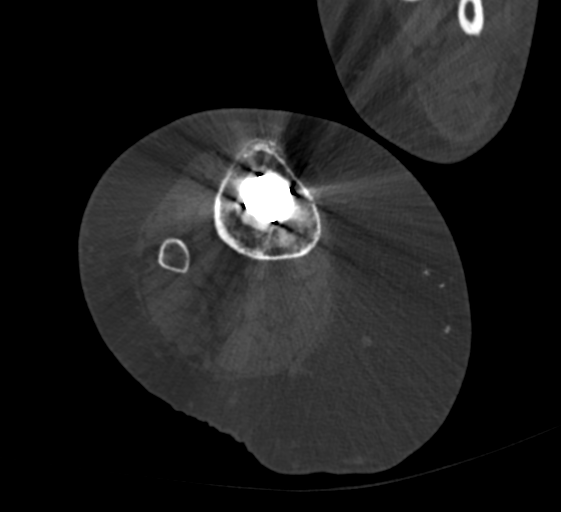
[im 57/183  bone]
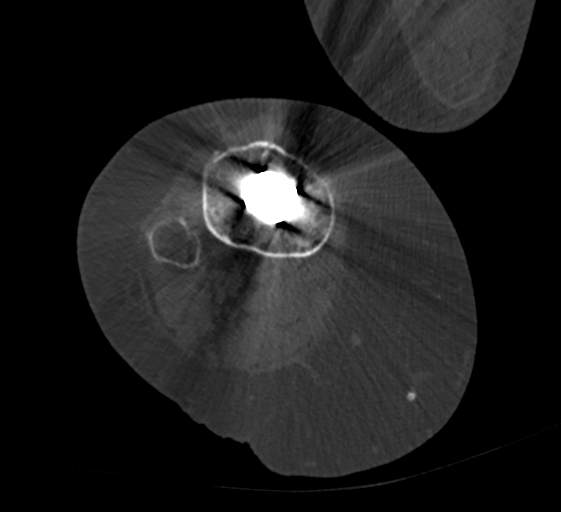
[im 85/183  bone]
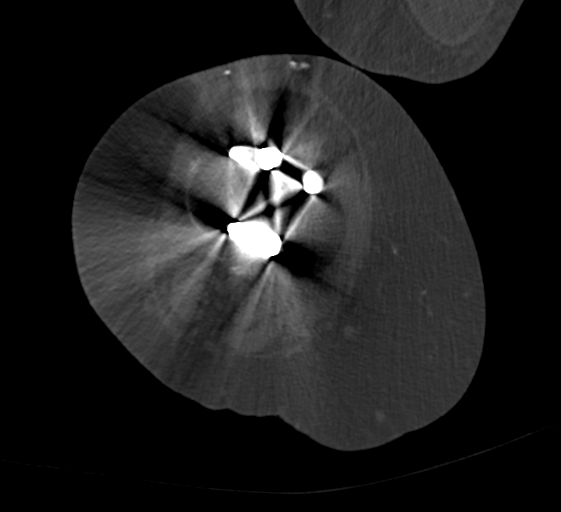
[im 99/183  soft-tissue]
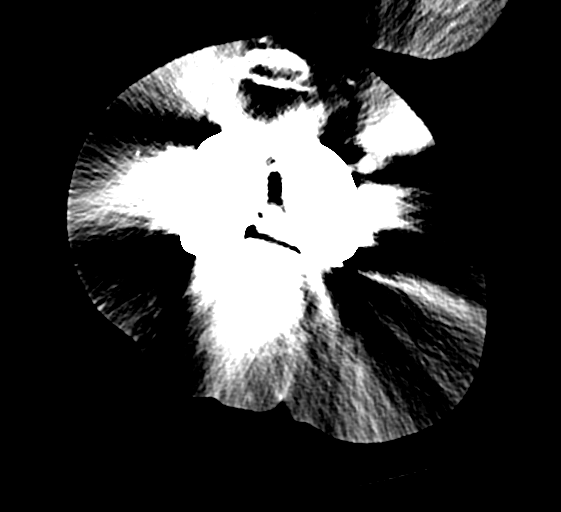
[im 99/183  bone]
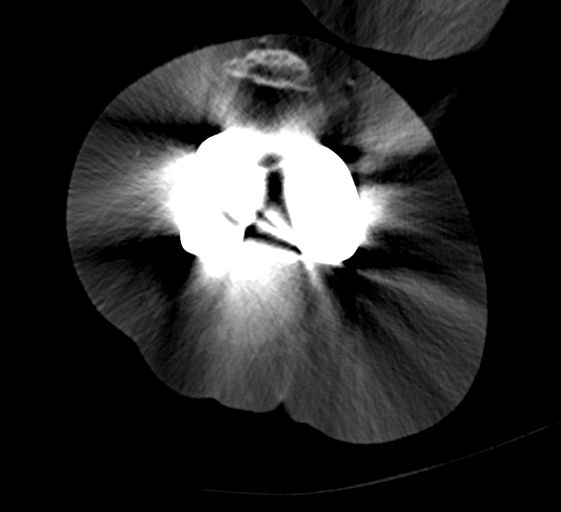
[im 127/183  bone]
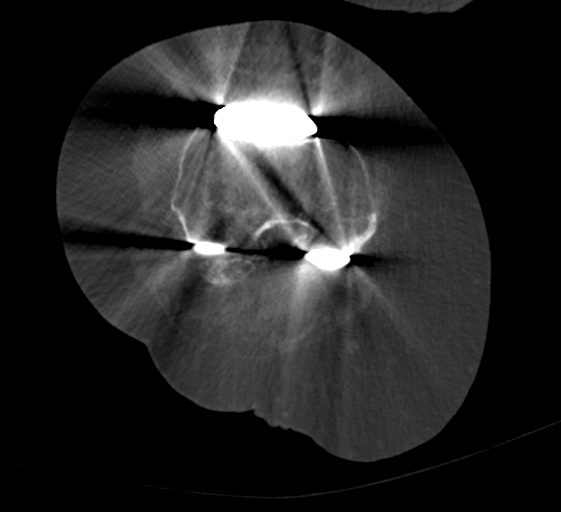
[im 141/183  bone]
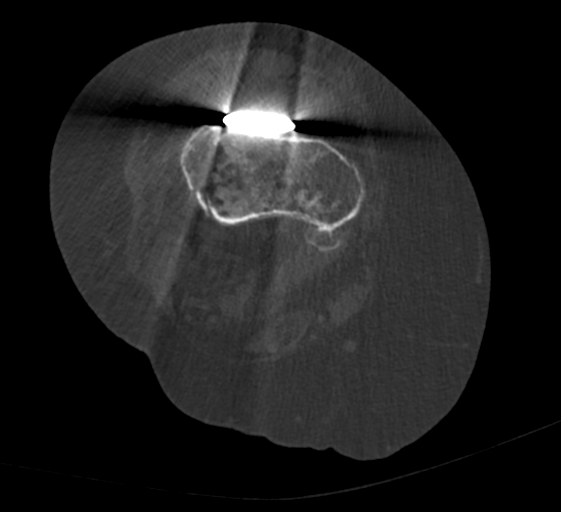
[im 169/183  bone]
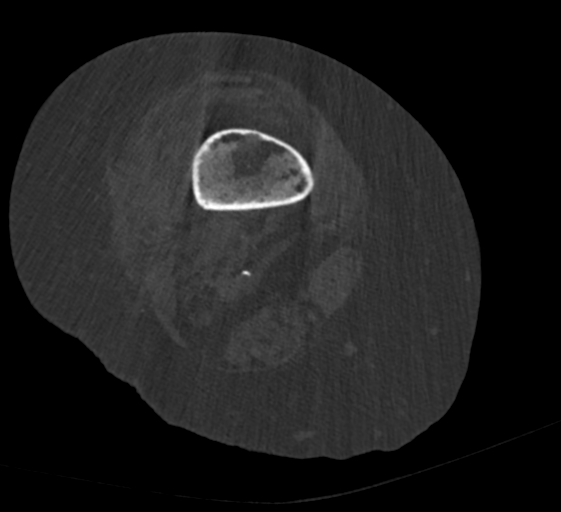

[Series 9: sag st · sagittal · 0.37mm/px · 6 of 218 slices shown]
[im 37/218  bone]
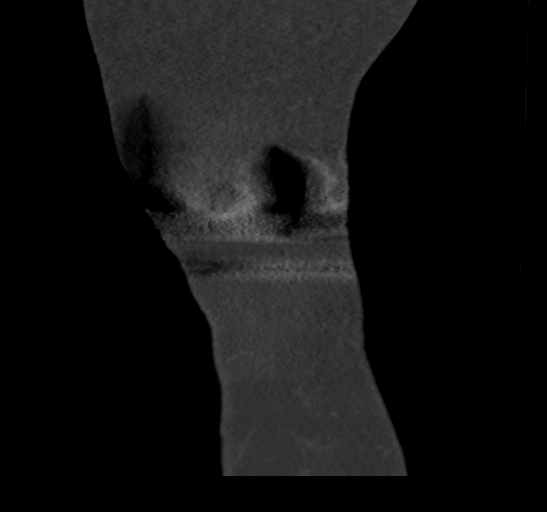
[im 48/218  soft-tissue]
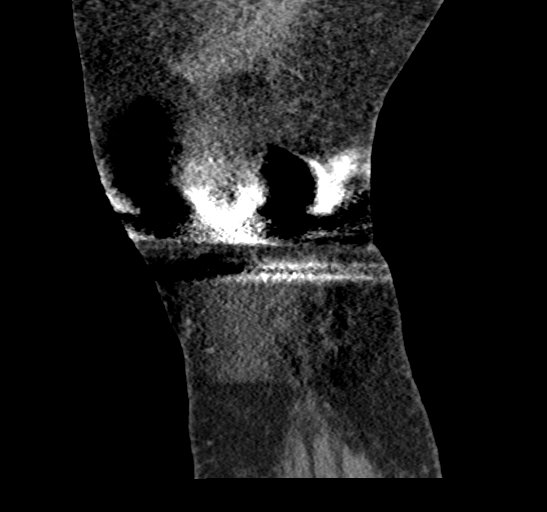
[im 73/218  bone]
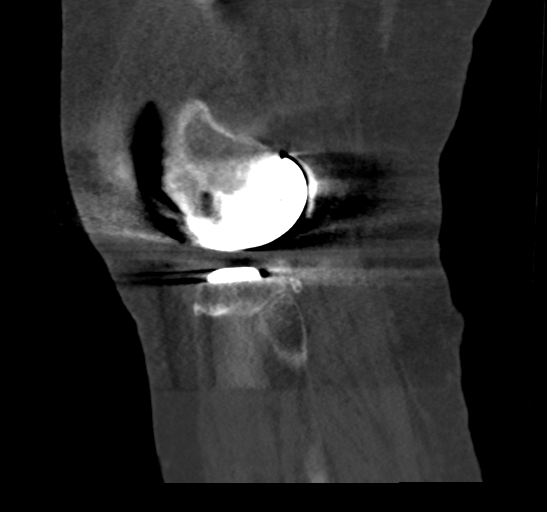
[im 109/218  bone]
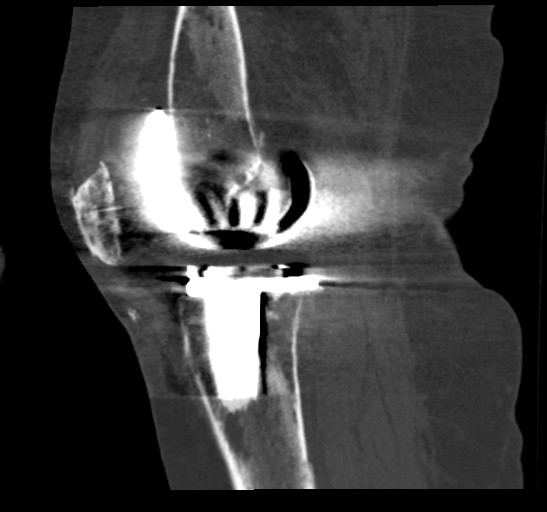
[im 145/218  bone]
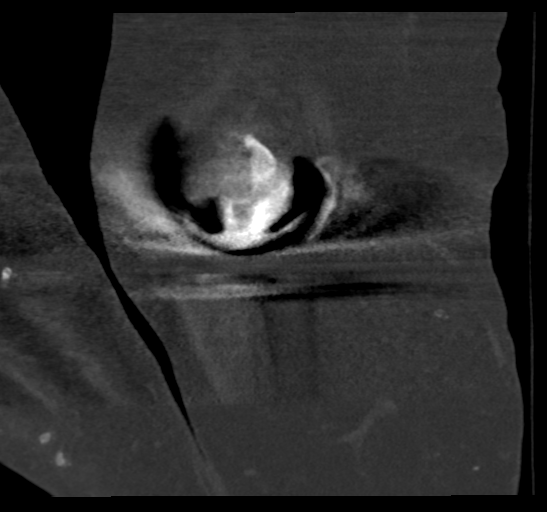
[im 181/218  bone]
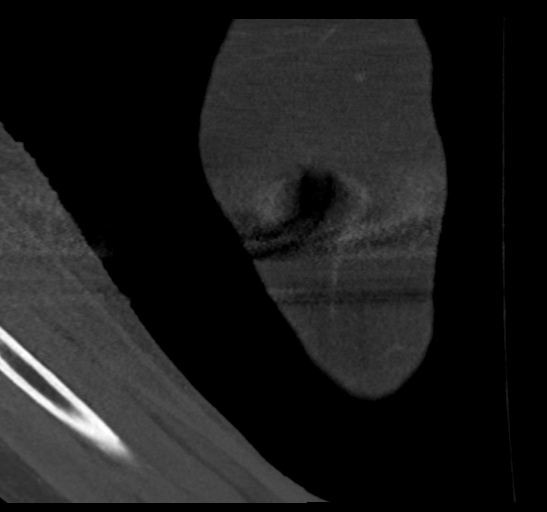

[14 of 34 positions shown; findings below may reference images not displayed]

FINDINGS: Bones/Joint/Cartilage

Right knee arthroplasty is in place. As seen on the comparison plain
films, there is a fracture through the metaphysis of the right
femur. Fracture through the lateral cortex is approximately 6.5 cm
above the articular surface of the patient's prosthesis. The
fracture extends in an oblique and inferior orientation through the
medial metaphysis approximately 5 cm above the articular surface.
The fracture shows valgus angulation of approximately 21 degrees.
Fragment override laterally of 1 cm is identified. Bones are
markedly osteopenic. No other fracture is identified. Patient motion
causes some artifact in the proximal diaphysis of the tibia on the
lateral side.

Ligaments

Suboptimally assessed by CT.

Muscles and Tendons

Appear intact.

Soft tissues

No acute abnormality.
IMPRESSION: Periprosthetic fracture distal right femur with approximately 21
degrees of valgus angulation and mild impaction as described above.

Marked osteopenia.

## 2018-02-19 ENCOUNTER — Inpatient Hospital Stay (HOSPITAL_COMMUNITY): Payer: Medicare Other | Attending: Hematology

## 2018-02-19 DIAGNOSIS — D500 Iron deficiency anemia secondary to blood loss (chronic): Secondary | ICD-10-CM | POA: Insufficient documentation

## 2018-02-19 DIAGNOSIS — R918 Other nonspecific abnormal finding of lung field: Secondary | ICD-10-CM

## 2018-02-19 LAB — CBC WITH DIFFERENTIAL/PLATELET
Abs Immature Granulocytes: 0.02 10*3/uL (ref 0.00–0.07)
Basophils Absolute: 0.1 10*3/uL (ref 0.0–0.1)
Basophils Relative: 1 %
Eosinophils Absolute: 0.3 10*3/uL (ref 0.0–0.5)
Eosinophils Relative: 3 %
HCT: 39.8 % (ref 36.0–46.0)
Hemoglobin: 12.5 g/dL (ref 12.0–15.0)
Immature Granulocytes: 0 %
Lymphocytes Relative: 28 %
Lymphs Abs: 2.6 10*3/uL (ref 0.7–4.0)
MCH: 30.3 pg (ref 26.0–34.0)
MCHC: 31.4 g/dL (ref 30.0–36.0)
MCV: 96.6 fL (ref 80.0–100.0)
Monocytes Absolute: 0.4 10*3/uL (ref 0.1–1.0)
Monocytes Relative: 4 %
Neutro Abs: 5.9 10*3/uL (ref 1.7–7.7)
Neutrophils Relative %: 64 %
Platelets: 377 10*3/uL (ref 150–400)
RBC: 4.12 MIL/uL (ref 3.87–5.11)
RDW: 14.3 % (ref 11.5–15.5)
WBC: 9.3 10*3/uL (ref 4.0–10.5)
nRBC: 0 % (ref 0.0–0.2)

## 2018-02-19 LAB — COMPREHENSIVE METABOLIC PANEL
ALT: 14 U/L (ref 0–44)
AST: 21 U/L (ref 15–41)
Albumin: 4 g/dL (ref 3.5–5.0)
Alkaline Phosphatase: 108 U/L (ref 38–126)
Anion gap: 12 (ref 5–15)
BUN: 25 mg/dL — ABNORMAL HIGH (ref 6–20)
CO2: 20 mmol/L — ABNORMAL LOW (ref 22–32)
Calcium: 8.8 mg/dL — ABNORMAL LOW (ref 8.9–10.3)
Chloride: 103 mmol/L (ref 98–111)
Creatinine, Ser: 1.23 mg/dL — ABNORMAL HIGH (ref 0.44–1.00)
GFR calc Af Amer: 55 mL/min — ABNORMAL LOW (ref >60)
GFR calc non Af Amer: 48 mL/min — ABNORMAL LOW (ref >60)
Glucose, Bld: 162 mg/dL — ABNORMAL HIGH (ref 70–99)
Potassium: 4.2 mmol/L (ref 3.5–5.1)
Sodium: 135 mmol/L (ref 135–145)
Total Bilirubin: 0.5 mg/dL (ref 0.3–1.2)
Total Protein: 8.1 g/dL (ref 6.5–8.1)

## 2018-02-19 LAB — FERRITIN: Ferritin: 116 ng/mL (ref 11–307)

## 2018-02-19 LAB — LACTATE DEHYDROGENASE: LDH: 123 U/L (ref 98–192)

## 2018-02-20 LAB — PROTEIN ELECTROPHORESIS, SERUM
A/G Ratio: 1.2 (ref 0.7–1.7)
Albumin ELP: 4.2 g/dL (ref 2.9–4.4)
Alpha-1-Globulin: 0.2 g/dL (ref 0.0–0.4)
Alpha-2-Globulin: 0.8 g/dL (ref 0.4–1.0)
Beta Globulin: 1.2 g/dL (ref 0.7–1.3)
Gamma Globulin: 1.3 g/dL (ref 0.4–1.8)
Globulin, Total: 3.4 g/dL (ref 2.2–3.9)
Total Protein ELP: 7.6 g/dL (ref 6.0–8.5)

## 2018-02-26 ENCOUNTER — Ambulatory Visit (HOSPITAL_COMMUNITY): Payer: Medicare Other | Admitting: Internal Medicine

## 2018-02-26 ENCOUNTER — Ambulatory Visit (HOSPITAL_COMMUNITY)
Admission: RE | Admit: 2018-02-26 | Discharge: 2018-02-26 | Disposition: A | Discharge status: Home / Self Care | Payer: Medicare Other | Attending: Gastroenterology | Admitting: Gastroenterology

## 2018-02-26 ENCOUNTER — Encounter (HOSPITAL_COMMUNITY): Payer: Self-pay | Admitting: *Deleted

## 2018-02-26 ENCOUNTER — Other Ambulatory Visit: Payer: Self-pay

## 2018-02-26 ENCOUNTER — Encounter (HOSPITAL_COMMUNITY)
Admission: RE | Disposition: A | Discharge status: Home / Self Care | Payer: Self-pay | Source: Home / Self Care | Attending: Gastroenterology

## 2018-02-26 DIAGNOSIS — Z7951 Long term (current) use of inhaled steroids: Secondary | ICD-10-CM | POA: Insufficient documentation

## 2018-02-26 DIAGNOSIS — Z809 Family history of malignant neoplasm, unspecified: Secondary | ICD-10-CM | POA: Diagnosis not present

## 2018-02-26 DIAGNOSIS — Z86010 Personal history of colonic polyps: Secondary | ICD-10-CM | POA: Insufficient documentation

## 2018-02-26 DIAGNOSIS — T39395A Adverse effect of other nonsteroidal anti-inflammatory drugs [NSAID], initial encounter: Secondary | ICD-10-CM | POA: Insufficient documentation

## 2018-02-26 DIAGNOSIS — K449 Diaphragmatic hernia without obstruction or gangrene: Secondary | ICD-10-CM | POA: Diagnosis not present

## 2018-02-26 DIAGNOSIS — K319 Disease of stomach and duodenum, unspecified: Secondary | ICD-10-CM | POA: Insufficient documentation

## 2018-02-26 DIAGNOSIS — Z7982 Long term (current) use of aspirin: Secondary | ICD-10-CM | POA: Insufficient documentation

## 2018-02-26 DIAGNOSIS — Z8249 Family history of ischemic heart disease and other diseases of the circulatory system: Secondary | ICD-10-CM | POA: Insufficient documentation

## 2018-02-26 DIAGNOSIS — K648 Other hemorrhoids: Secondary | ICD-10-CM | POA: Diagnosis not present

## 2018-02-26 DIAGNOSIS — I110 Hypertensive heart disease with heart failure: Secondary | ICD-10-CM | POA: Insufficient documentation

## 2018-02-26 DIAGNOSIS — K521 Toxic gastroenteritis and colitis: Secondary | ICD-10-CM | POA: Insufficient documentation

## 2018-02-26 DIAGNOSIS — Z96653 Presence of artificial knee joint, bilateral: Secondary | ICD-10-CM | POA: Diagnosis not present

## 2018-02-26 DIAGNOSIS — K2940 Chronic atrophic gastritis without bleeding: Secondary | ICD-10-CM | POA: Diagnosis not present

## 2018-02-26 DIAGNOSIS — Z79899 Other long term (current) drug therapy: Secondary | ICD-10-CM | POA: Diagnosis not present

## 2018-02-26 DIAGNOSIS — Z7984 Long term (current) use of oral hypoglycemic drugs: Secondary | ICD-10-CM | POA: Insufficient documentation

## 2018-02-26 DIAGNOSIS — K2970 Gastritis, unspecified, without bleeding: Secondary | ICD-10-CM | POA: Diagnosis not present

## 2018-02-26 DIAGNOSIS — D473 Essential (hemorrhagic) thrombocythemia: Secondary | ICD-10-CM | POA: Insufficient documentation

## 2018-02-26 DIAGNOSIS — E119 Type 2 diabetes mellitus without complications: Secondary | ICD-10-CM | POA: Insufficient documentation

## 2018-02-26 DIAGNOSIS — R350 Frequency of micturition: Secondary | ICD-10-CM | POA: Diagnosis not present

## 2018-02-26 DIAGNOSIS — F329 Major depressive disorder, single episode, unspecified: Secondary | ICD-10-CM | POA: Insufficient documentation

## 2018-02-26 DIAGNOSIS — R600 Localized edema: Secondary | ICD-10-CM | POA: Insufficient documentation

## 2018-02-26 DIAGNOSIS — K559 Vascular disorder of intestine, unspecified: Secondary | ICD-10-CM

## 2018-02-26 DIAGNOSIS — F17210 Nicotine dependence, cigarettes, uncomplicated: Secondary | ICD-10-CM | POA: Insufficient documentation

## 2018-02-26 DIAGNOSIS — D509 Iron deficiency anemia, unspecified: Secondary | ICD-10-CM | POA: Insufficient documentation

## 2018-02-26 DIAGNOSIS — K2950 Unspecified chronic gastritis without bleeding: Secondary | ICD-10-CM | POA: Diagnosis not present

## 2018-02-26 DIAGNOSIS — Z981 Arthrodesis status: Secondary | ICD-10-CM | POA: Diagnosis not present

## 2018-02-26 DIAGNOSIS — K219 Gastro-esophageal reflux disease without esophagitis: Secondary | ICD-10-CM | POA: Diagnosis not present

## 2018-02-26 DIAGNOSIS — J449 Chronic obstructive pulmonary disease, unspecified: Secondary | ICD-10-CM | POA: Insufficient documentation

## 2018-02-26 DIAGNOSIS — K5190 Ulcerative colitis, unspecified, without complications: Secondary | ICD-10-CM | POA: Diagnosis not present

## 2018-02-26 HISTORY — PX: BIOPSY: SHX5522

## 2018-02-26 HISTORY — PX: ESOPHAGOGASTRODUODENOSCOPY: SHX5428

## 2018-02-26 HISTORY — PX: COLONOSCOPY: SHX5424

## 2018-02-26 LAB — GLUCOSE, CAPILLARY
Glucose-Capillary: 65 mg/dL — ABNORMAL LOW (ref 70–99)
Glucose-Capillary: 180 mg/dL — ABNORMAL HIGH (ref 70–99)

## 2018-02-26 LAB — TSH: TSH: 1.036 u[IU]/mL (ref 0.350–4.500)

## 2018-02-26 SURGERY — COLONOSCOPY
Anesthesia: Moderate Sedation

## 2018-02-26 MED ORDER — MIDAZOLAM HCL 5 MG/5ML IJ SOLN
INTRAMUSCULAR | Status: DC | PRN
  Administered 2018-02-26: 2 mg via INTRAVENOUS
  Administered 2018-02-26: 1 mg via INTRAVENOUS
  Administered 2018-02-26: 2 mg via INTRAVENOUS
  Administered 2018-02-26: 1 mg via INTRAVENOUS

## 2018-02-26 MED ORDER — MEPERIDINE HCL 100 MG/ML IJ SOLN
INTRAMUSCULAR | Status: DC | PRN
  Administered 2018-02-26: 50 mg
  Administered 2018-02-26: 25 mg

## 2018-02-26 MED ORDER — STERILE WATER FOR IRRIGATION IR SOLN
Status: DC | PRN
  Administered 2018-02-26: 1.5 mL

## 2018-02-26 MED ORDER — MEPERIDINE HCL 100 MG/ML IJ SOLN
INTRAMUSCULAR | Status: AC
  Filled 2018-02-26: qty 2

## 2018-02-26 MED ORDER — DEXTROSE-NACL 5-0.9 % IV SOLN
INTRAVENOUS | Status: DC
  Administered 2018-02-26: 09:00:00 via INTRAVENOUS

## 2018-02-26 MED ORDER — SODIUM CHLORIDE 0.9 % IV SOLN: INTRAVENOUS | Status: DC

## 2018-02-26 MED ORDER — ONDANSETRON HCL 4 MG/2ML IJ SOLN
INTRAMUSCULAR | Status: AC
  Filled 2018-02-26: qty 2

## 2018-02-26 MED ORDER — LIDOCAINE VISCOUS HCL 2 % MT SOLN
OROMUCOSAL | Status: AC
  Filled 2018-02-26: qty 15

## 2018-02-26 MED ORDER — MIDAZOLAM HCL 5 MG/5ML IJ SOLN
INTRAMUSCULAR | Status: AC
  Filled 2018-02-26: qty 10

## 2018-02-26 MED ORDER — ONDANSETRON HCL 4 MG/2ML IJ SOLN
4.0000 mg | Freq: Once | INTRAMUSCULAR | Status: AC
  Administered 2018-02-26: 4 mg via INTRAVENOUS

## 2018-02-26 NOTE — Op Note (Signed)
[facility] Patient Name: Debbie Mullins Procedure Date: 02/26/2018 10:51 AM MRN: [id number] Date of Birth: 1955-12-20 Attending MD: Barney Drain MD, MD CSN: [id number] Age: 62 Admit Type: Outpatient Procedure:                Upper GI endoscopy WITH COLD FORCEPS BIOPSY Indications:              Iron deficiency anemia Providers:                Barney Drain MD, MD, Gerome Sam, RN, Lurline Del, RN, Bonnetta Barry, Technician Referring MD:             Elyn Peers MD Medicines:                TCS + Midazolam 2 mg IV Complications:            No immediate complications. Estimated Blood Loss:     Estimated blood loss was minimal. Procedure:                Pre-Anesthesia Assessment:                           - Prior to the procedure, a History and Physical                            was performed, and patient medications and                            allergies were reviewed. The patient's tolerance of                            previous anesthesia was also reviewed. The risks                            and benefits of the procedure and the sedation                            options and risks were discussed with the patient.                            All questions were answered, and informed consent                            was obtained. Prior Anticoagulants: The patient has                            taken aspirin, last dose was 1 day prior to                            procedure. ASA Grade Assessment: III - A patient                            with severe systemic disease. After reviewing the  risks and benefits, the patient was deemed in                            satisfactory condition to undergo the procedure.                            After obtaining informed consent, the endoscope was                            passed under direct vision. Throughout the                            procedure, the patient's blood  pressure, pulse, and                            oxygen saturations were monitored continuously. The                            GIF-H190 ([id number]) was introduced through the                            mouth, and advanced to the second part of duodenum.                            The upper GI endoscopy was technically difficult                            and complex due to a J-shaped stomach which made                            pyloric intubation difficult and the patient's                            agitation. Successful completion of the procedure                            was aided by increasing the dose of sedation                            medication and receiving assistance from additional                            staff. The patient tolerated the procedure fairly                            well. Scope In: 10:52:42 AM Scope Out: 11:05:09 AM Total Procedure Duration: 0 hours 12 minutes 27 seconds  Findings:      The examined esophagus was normal.      A large hiatal hernia was present.      Patchy mild inflammation characterized by congestion (edema) and       erythema was found in the gastric antrum. Biopsies(3: ANTRUM, 2: BODY)       were taken with a cold forceps for Helicobacter pylori testing.  The second portion of the duodenum was normal. Biopsies for histology       were taken with a cold forceps for evaluation of celiac disease. BTL 2      The duodenal bulb was normal. Biopsies for histology were taken with a       cold forceps for evaluation of celiac disease. BTL 3 Impression:               - FEDA DUE TO NSAID INDUCED COLITIS.                           - Large hiatal hernia.                           - Gastritis. Biopsied. Moderate Sedation:      Moderate (conscious) sedation was administered by the endoscopy nurse       and supervised by the endoscopist. The following parameters were       monitored: oxygen saturation, heart rate, blood pressure, and response        to care. Total physician intraservice time was 56 minutes. Recommendation:           - Patient has a contact number available for                            emergencies. The signs and symptoms of potential                            delayed complications were discussed with the                            patient. Return to normal activities tomorrow.                            Written discharge instructions were provided to the                            patient.                           - High fiber diet.                           - Continue present medications.                           - Await pathology results.                           - Return to my office after studies are complete. Procedure Code(s):        --- Professional ---                           [id number], Esophagogastroduodenoscopy, flexible,                            transoral; with biopsy, single or multiple  42595, Moderate sedation; each additional 15                            minutes intraservice time                           99153, Moderate sedation; each additional 15                            minutes intraservice time                           99153, Moderate sedation; each additional 15                            minutes intraservice time                           G0500, Moderate sedation services provided by the                            same physician or other qualified health care                            professional performing a gastrointestinal                            endoscopic service that sedation supports,                            requiring the presence of an independent trained                            observer to assist in the monitoring of the                            patient's level of consciousness and physiological                            status; initial 15 minutes of intra-service time;                            patient age 80 years or older (additional  time may                            be reported with [phone], as appropriate) Diagnosis Code(s):        --- Professional ---                           K44.9, Diaphragmatic hernia without obstruction or                            gangrene                           K29.70, Gastritis, unspecified, without bleeding  D50.9, Iron deficiency anemia, unspecified CPT copyright 2018 American [employer]. All rights reserved. The codes documented in this report are preliminary and upon coder review may  be revised to meet current compliance requirements. Barney Drain, MD Barney Drain MD, MD 02/26/2018 11:21:03 AM This report has been signed electronically. Number of Addenda: 0

## 2018-02-26 NOTE — Op Note (Signed)
[facility] Patient Name: Debbie Mullins Procedure Date: 02/26/2018 10:07 AM MRN: [id number] Date of Birth: 12-Dec-1955 Attending MD: Barney Drain MD, MD CSN: [id number] Age: 61 Admit Type: Outpatient Procedure:                Colonoscopy, DIAGNOSTIC Indications:              Iron deficiency anemia Providers:                Barney Drain MD, MD, Gerome Sam, RN, Lurline Del, RN, Bonnetta Barry, Technician Referring MD:             Elyn Peers MD Medicines:                Meperidine 75 mg IV, Midazolam 4 mg IV Complications:            No immediate complications. Estimated Blood Loss:     Estimated blood loss was minimal. Procedure:                Pre-Anesthesia Assessment:                           - Prior to the procedure, a History and Physical                            was performed, and patient medications and                            allergies were reviewed. The patient's tolerance of                            previous anesthesia was also reviewed. The risks                            and benefits of the procedure and the sedation                            options and risks were discussed with the patient.                            All questions were answered, and informed consent                            was obtained. Prior Anticoagulants: The patient has                            taken aspirin, last dose was 1 day prior to                            procedure. ASA Grade Assessment: III - A patient                            with severe systemic disease. After reviewing the  risks and benefits, the patient was deemed in                            satisfactory condition to undergo the procedure.                            After obtaining informed consent, the colonoscope                            was passed under direct vision. Throughout the                            procedure, the patient's blood pressure,  pulse, and                            oxygen saturations were monitored continuously. The                            PCF-[id number] ([id number]) scope was introduced through                            the anus and advanced to the 10 cm into the ileum.                            The colonoscopy was technically difficult and                            complex due to significant looping and the                            patient's agitation. Successful completion of the                            procedure was aided by increasing the dose of                            sedation medication, straightening and shortening                            the scope to obtain bowel loop reduction and                            COLOWRAP. The patient tolerated the procedure                            fairly well. The quality of the bowel preparation                            was good. The terminal ileum, ileocecal valve,                            appendiceal orifice, and rectum were photographed. Scope In: 10:20:30 AM Scope Out: 10:47:10 AM Scope Withdrawal Time: 0 hours 20 minutes 16  seconds  Total Procedure Duration: 0 hours 26 minutes 40 seconds  Findings:      The terminal ileum appeared normal.      Localized moderate inflammation characterized by congestion (edema),       erosions, erythema and shallow ulcerations was found in the cecum.       Biopsies were taken with a cold forceps for histology.      The recto-sigmoid colon, sigmoid colon and descending colon revealed       grossly excessive looping.      Internal hemorrhoids were found. The hemorrhoids were small. Impression:               - The examined portion of the ileum was normal.                           - FeDA DUE MOST LIKLEY DUE TO NSAID INDUCED CECITIS                           - There was significant looping of the colon.                           - Internal hemorrhoids. Moderate Sedation:      Moderate (conscious) sedation was  administered by the endoscopy nurse       and supervised by the endoscopist. The following parameters were       monitored: oxygen saturation, heart rate, blood pressure, and response       to care. Total physician intraservice time was 56 minutes. Recommendation:           - Patient has a contact number available for                            emergencies. The signs and symptoms of potential                            delayed complications were discussed with the                            patient. Return to normal activities tomorrow.                            Written discharge instructions were provided to the                            patient.                           - High fiber diet.                           - Continue present medications.                           - Await pathology results.                           - Repeat colonoscopy in 10 years for surveillance  WITH PEDS COLONOSCOPE. Procedure Code(s):        --- Professional ---                           [phone], Colonoscopy, flexible; with biopsy, single                            or multiple                           99153, Moderate sedation; each additional 15                            minutes intraservice time                           99153, Moderate sedation; each additional 15                            minutes intraservice time                           99153, Moderate sedation; each additional 15                            minutes intraservice time                           G0500, Moderate sedation services provided by the                            same physician or other qualified health care                            professional performing a gastrointestinal                            endoscopic service that sedation supports,                            requiring the presence of an independent trained                            observer to assist in the monitoring of the                             patient's level of consciousness and physiological                            status; initial 15 minutes of intra-service time;                            patient age 56 years or older (additional time may                            be reported with [phone], as appropriate) Diagnosis Code(s):        ---  Professional ---                           K64.8, Other hemorrhoids                           K55.9, Vascular disorder of intestine, unspecified                           D50.9, Iron deficiency anemia, unspecified CPT copyright 2018 American Medical Association. All rights reserved. The codes documented in this report are preliminary and upon coder review may  be revised to meet current compliance requirements. Barney Drain, MD Barney Drain MD, MD 02/26/2018 11:15:53 AM This report has been signed electronically. Number of Addenda: 0

## 2018-02-26 NOTE — H&P (Signed)
Primary Care Physician:  Lucianne Lei, MD Primary Gastroenterologist:  Dr. Oneida Alar  Pre-Procedure History & Physical: HPI:  Debbie Mullins is a 62 y.o. female here for IRON DEFICIENCY ANEMIA.  Past Medical History:  Diagnosis Date  . Anemia   . Asthma   . CHF (congestive heart failure) (HCC)    takes Furosemide daily   . COPD (chronic obstructive pulmonary disease) (HCC)    Albuterol inhaler prn;SIngulair at night  . Depression    takes Wellbutrin daily  . Diabetes mellitus    takes Metformin daily  . GERD (gastroesophageal reflux disease)    takes Nexium daily  . History of colon polyps    benign  . Hypertension    takes Lisinopril daily  . Iron deficiency anemia 08/01/2017  . Joint pain   . Joint swelling   . Leukocytosis 02/09/2011  . Neck pain    HNP  . Normocytic anemia 02/09/2011  . Peripheral edema    takes Furosemide daily  . Pneumonia    many yrs ago  . Pulmonary nodules/lesions, multiple 08/09/2013  . Shortness of breath dyspnea    with exertion  . Thrombocytosis ([place]) 02/09/2011  . Urinary frequency   . Urinary urgency   . Weakness    numbness and tingling in both hands    Past Surgical History:  Procedure Laterality Date  . ANTERIOR CERVICAL DECOMP/DISCECTOMY FUSION N/A 11/17/2014   Procedure: Cervical four- cervical five Anterior Cervical Decompression with fusion and bonegraft;  Surgeon: Ashok Pall, MD;  Location: [facility];  Service: Neurosurgery;  Laterality: N/A;  C45 anterior cervical decompression with fusion plating and bonegraft  . COLONOSCOPY    . FEMUR IM NAIL Right 05/23/2016  . FEMUR IM NAIL Right 05/23/2016   Procedure: INTRAMEDULLARY (IM) RETROGRADE FEMORAL NAILING;  Surgeon: [name], MD;  Location: [facility];  Service: Orthopedics;  Laterality: Right;  . KNEE ARTHROPLASTY Left 08/10/2013   Procedure:  TOTAL KNEE ARTHROPLASTY;  Surgeon: [name], MD;  Location: [facility];  Service: Orthopedics;  Laterality: Left;  Left Total Knee  Arthroplasty Revision, Cemented, Semi-constrained,   . LEFT AND RIGHT HEART CATHETERIZATION WITH CORONARY ANGIOGRAM N/A 01/07/2012   Procedure: LEFT AND RIGHT HEART CATHETERIZATION WITH CORONARY ANGIOGRAM;  Surgeon: Thayer Headings, MD;  Location[facility] CATH LAB;  Service: Cardiovascular;  Laterality: N/A;  . TONSILLECTOMY     as a child  . TOTAL KNEE ARTHROPLASTY Bilateral     Prior to Admission medications   Medication Sig Start Date End Date Taking? Authorizing Provider  amLODipine (NORVASC) 5 MG tablet Take 5 mg by mouth at bedtime.    Yes [provider]  aspirin EC 81 MG tablet Take 81 mg by mouth daily.   Yes [provider]  buPROPion (WELLBUTRIN XL) 150 MG 24 hr tablet Take 150 mg by mouth daily.   Yes [provider]  celecoxib (CELEBREX) 200 MG capsule Take 200 mg by mouth daily.  10/01/16  Yes [provider]  esomeprazole (NEXIUM) 40 MG capsule Take 40 mg by mouth daily before breakfast.    Yes [provider]  furosemide (LASIX) 20 MG tablet Take 1 tablet (20 mg total) by mouth every other day. Patient taking differently: Take 20 mg by mouth daily.  04/01/13  Yes Fay Records, MD  metFORMIN (GLUCOPHAGE) 500 MG tablet Take 500 mg by mouth 3 (three) times daily.   Yes [provider]  montelukast (SINGULAIR) 10 MG tablet Take 10  mg by mouth at bedtime.   Yes [provider]  Na Sulfate-K Sulfate-Mg Sulf (SUPREP BOWEL PREP KIT) 17.5-3.13-1.6 GM/177ML SOLN Take 1 kit by mouth as directed. 12/25/17  Yes Azaylia Fong L, MD  ZORVOLEX 35 MG CAPS Take 35 mg by mouth 3 (three) times daily.   Yes [provider]    Allergies as of 12/25/2017  . (No Known Allergies)    Family History  Problem Relation Age of Onset  . Heart failure Mother   . Cancer Maternal Grandmother   . Colon cancer Neg Hx   . Colon polyps Neg Hx     Social History   Socioeconomic History  . Marital status: Single    Spouse name: Not on  file  . Number of children: Not on file  . Years of education: Not on file  . Highest education level: Not on file  Occupational History  . Not on file  Social Needs  . Financial resource strain: Not on file  . Food insecurity:    Worry: Not on file    Inability: Not on file  . Transportation needs:    Medical: Not on file    Non-medical: Not on file  Tobacco Use  . Smoking status: Current Every Day Smoker    Packs/day: 0.50    Years: 41.00    Pack years: 20.50    Types: Cigarettes  . Smokeless tobacco: Never Used  Substance and Sexual Activity  . Alcohol use: Yes    Comment: wine  . Drug use: No  . Sexual activity: Never  Lifestyle  . Physical activity:    Days per week: Not on file    Minutes per session: Not on file  . Stress: Not on file  Relationships  . Social connections:    Talks on phone: Not on file    Gets together: Not on file    Attends religious service: Not on file    Active member of club or organization: Not on file    Attends meetings of clubs or organizations: Not on file    Relationship status: Not on file  . Intimate partner violence:    Fear of current or ex partner: Not on file    Emotionally abused: Not on file    Physically abused: Not on file    Forced sexual activity: Not on file  Other Topics Concern  . Not on file  Social History Narrative   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 COUSINS TRANSPORTS HER.    Review of Systems: See HPI, otherwise negative ROS   Physical Exam: BP 131/74   Pulse 78   Temp 97.8 F (36.6 C) (Oral)   Resp 16   Ht 5' 3"  (1.6 m)   Wt 74.8 kg   LMP 07/03/2011   SpO2 98%   BMI 29.23 kg/m  General:   Alert,  pleasant and cooperative in NAD Head:  Normocephalic and atraumatic. Neck:  Supple; Lungs:  Clear throughout to auscultation.    Heart:  Regular rate and rhythm. Abdomen:  Soft, nontender and nondistended. Normal bowel sounds, without guarding, and without rebound.    Neurologic:  Alert and  oriented x4;  grossly normal neurologically.  Impression/Plan:    IRON DEFICIENCY ANEMIA.   PLAN:  1. TCS/EGD TODAY. DISCUSSED PROCEDURE, BENEFITS, & RISKS: < 1% chance of medication reaction, bleeding, perforation, or rupture of spleen/liver.

## 2018-02-26 NOTE — Discharge Instructions (Signed)
YOU DID NOT HAVE ANY POLYPS. THE LAST PART OF YOUr SMALL BOWEL IS NORMAL. You have A LIMITED colitis due to aspirin use. You have SMALL internal hemorrhoids. You have mild gastritis, & a LARGE HIATAL HERNIA. I biopsied your stomach, & SMALL BOWEL.    FOLLOW A HIGH FIBER DIET. AVOID ITEMS THAT CAUSE BLOATING. SEE INFO BELOW.  CONTINUE NEXIUM. TAKE 30 MINUTES BEFORE YOUR FIRST MEAL.  YOUR BIOPSY RESULTS WILL BE BACK IN 5 BUSINESS DAYS.  FOLLOW UP IN THE OFFICE WILL BE SCHEDULED IF NEEDED AFTER BIOPSY RESULTS ARE COMPLETE.  Next colonoscopy in 10 years.   ENDOSCOPY Care After Read the instructions outlined below and refer to this sheet in the next week. These discharge instructions provide you with general information on caring for yourself after you leave the hospital. While your treatment has been planned according to the most current medical practices available, unavoidable complications occasionally occur. If you have any problems or questions after discharge, call DR. FIELDS, [phone].  ACTIVITY  You may resume your regular activity, but move at a slower pace for the next 24 hours.   Take frequent rest periods for the next 24 hours.   Walking will help get rid of the air and reduce the bloated feeling in your belly (abdomen).   No driving for 24 hours (because of the medicine (anesthesia) used during the test).   You may shower.   Do not sign any important legal documents or operate any machinery for 24 hours (because of the anesthesia used during the test).    NUTRITION  Drink plenty of fluids.   You may resume your normal diet as instructed by your doctor.   Begin with a light meal and progress to your normal diet. Heavy or fried foods are harder to digest and may make you feel sick to your stomach (nauseated).   Avoid alcoholic beverages for 24 hours or as instructed.    MEDICATIONS  You may resume your normal medications.   WHAT YOU CAN EXPECT TODAY  Some  feelings of bloating in the abdomen.   Passage of more gas than usual.   Spotting of blood in your stool or on the toilet paper  .  IF YOU HAD POLYPS REMOVED DURING THE ENDOSCOPY:  Eat a soft diet IF YOU HAVE NAUSEA, BLOATING, ABDOMINAL PAIN, OR VOMITING.    FINDING OUT THE RESULTS OF YOUR TEST Not all test results are available during your visit. DR. Oneida Alar WILL CALL YOU WITHIN 7 DAYS OF YOUR PROCEDUE WITH YOUR RESULTS. Do not assume everything is normal if you have not heard from DR. FIELDS IN ONE WEEK, CALL HER OFFICE AT [phone].  SEEK IMMEDIATE MEDICAL ATTENTION AND CALL THE OFFICE: [phone] IF:  You have more than a spotting of blood in your stool.   Your belly is swollen (abdominal distention).   You are nauseated or vomiting.   You have a temperature over 101F.   You have abdominal pain or discomfort that is severe or gets worse throughout the day.   Gastritis  Gastritis is an inflammation (the body's way of reacting to injury and/or infection) of the stomach. It is often caused by viral or bacterial (germ) infections. It can also be caused BY ASPIRIN, BC/GOODY POWDER'S, (IBUPROFEN) MOTRIN, OR ALEVE (NAPROXEN), chemicals (including alcohol), SPICY FOODS, and medications. This illness may be associated with generalized malaise (feeling tired, not well), UPPER ABDOMINAL STOMACH cramps, and fever. One common bacterial cause of gastritis is an organism known  as H. Pylori. This can be treated with antibiotics.    Hiatal Hernia A hiatal hernia occurs when a part of the stomach slides above the diaphragm. The diaphragm is the thin muscle separating the belly (abdomen) from the chest. A hiatal hernia can be something you are born with or develop over time. Hiatal hernias may allow stomach acid to flow back into your esophagus, the tube which carries food from your mouth to your stomach. If this acid causes problems it is called GERD (gastro-esophageal reflux disease).    High-Fiber Diet A high-fiber diet changes your normal diet to include more whole grains, legumes, fruits, and vegetables. Changes in the diet involve replacing refined carbohydrates with unrefined foods. The calorie level of the diet is essentially unchanged. The Dietary Reference Intake (recommended amount) for adult males is 38 grams per day. For adult females, it is 25 grams per day. Pregnant and lactating women should consume 28 grams of fiber per day. Fiber is the intact part of a plant that is not broken down during digestion. Functional fiber is fiber that has been isolated from the plant to provide a beneficial effect in the body.  PURPOSE  Increase stool bulk.   Ease and regulate bowel movements.   Lower cholesterol.   REDUCE RISK OF COLON CANCER  INDICATIONS THAT YOU NEED MORE FIBER  Constipation and hemorrhoids.   Uncomplicated diverticulosis (intestine condition) and irritable bowel syndrome.   Weight management.   As a protective measure against hardening of the arteries (atherosclerosis), diabetes, and cancer.   GUIDELINES FOR INCREASING FIBER IN THE DIET  Start adding fiber to the diet slowly. A gradual increase of about 5 more grams (2 slices of whole-wheat bread, 2 servings of most fruits or vegetables, or 1 bowl of high-fiber cereal) per day is best. Too rapid an increase in fiber may result in constipation, flatulence, and bloating.   Drink enough water and fluids to keep your urine clear or pale yellow. Water, juice, or caffeine-free drinks are recommended. Not drinking enough fluid may cause constipation.   Eat a variety of high-fiber foods rather than one type of fiber.   Try to increase your intake of fiber through using high-fiber foods rather than fiber pills or supplements that contain small amounts of fiber.   The goal is to change the types of food eaten. Do not supplement your present diet with high-fiber foods, but replace foods in your present diet.   INCLUDE A VARIETY OF FIBER SOURCES  Replace refined and processed grains with whole grains, canned fruits with fresh fruits, and incorporate other fiber sources. White rice, white breads, and most bakery goods contain little or no fiber.   Brown whole-grain rice, buckwheat oats, and many fruits and vegetables are all good sources of fiber. These include: broccoli, Brussels sprouts, cabbage, cauliflower, beets, sweet potatoes, white potatoes (skin on), carrots, tomatoes, eggplant, squash, berries, fresh fruits, and dried fruits.   Cereals appear to be the richest source of fiber. Cereal fiber is found in whole grains and bran. Bran is the fiber-rich outer coat of cereal grain, which is largely removed in refining. In whole-grain cereals, the bran remains. In breakfast cereals, the largest amount of fiber is found in those with "bran" in their names. The fiber content is sometimes indicated on the label.   You may need to include additional fruits and vegetables each day.   In baking, for 1 cup white flour, you may use the following substitutions:   1  cup whole-wheat flour minus 2 tablespoons.   1/2 cup white flour plus 1/2 cup whole-wheat flour.

## 2018-03-03 ENCOUNTER — Encounter (HOSPITAL_COMMUNITY): Payer: Self-pay | Admitting: Gastroenterology

## 2018-03-06 ENCOUNTER — Telehealth: Payer: Self-pay | Admitting: Gastroenterology

## 2018-03-06 DIAGNOSIS — K559 Vascular disorder of intestine, unspecified: Secondary | ICD-10-CM

## 2018-03-06 DIAGNOSIS — I998 Other disorder of circulatory system: Secondary | ICD-10-CM

## 2018-03-06 NOTE — Telephone Encounter (Signed)
PLEASE CALL PT. HER COLON BIOPSIES SHOW ULCERS/COLITIS DUE TO LOW BLOOD SUPPLY TO THE RIGHT COLON(ISCHEMIA). THIS IS THE SOURCE OF HER BLOOD LOSS. Please call pt. HER stomach Bx shows mild gastritis.  HER SMALL BOWEL BIOPSIES ARE NORMAL.  SHE NEEDS A CT ANGIOGRAM ABD/PELVIS, Dx: ISCHEMIC CECITIS. PT IS ESRD.

## 2018-03-07 NOTE — Addendum Note (Signed)
Addended by: Hassan Rowan on: 03/07/2018 10:44 AM   Modules accepted: Orders

## 2018-03-07 NOTE — Telephone Encounter (Signed)
CT angio scheduled for 03/24/18 at 4:00pm, arrive at 3:45pm. Liquids only for 4 hours before test. Called and informed pt of appt. Letter mailed.

## 2018-03-07 NOTE — Telephone Encounter (Signed)
No PA needed for CT angio per [facility] website.

## 2018-03-07 NOTE — Telephone Encounter (Signed)
Pt notified of results. Please schedule CT.

## 2018-03-11 ENCOUNTER — Other Ambulatory Visit: Payer: Self-pay

## 2018-03-11 NOTE — Patient Outreach (Signed)
[facility]) Care Management  03/11/2018  TASHAWN GREFF Feb 25, 1956 [id number]   Medication Adherence call to Mrs. Allayne Butcher patient did not answer patient is due on Metformin 500 mg.Mrs. Basham is showing past due under [place].   [facility] Management Direct Dial [phone]  Fax [phone] [email or web address]

## 2018-03-18 ENCOUNTER — Other Ambulatory Visit: Payer: Self-pay

## 2018-03-18 ENCOUNTER — Inpatient Hospital Stay (HOSPITAL_COMMUNITY): Payer: Medicare Other | Attending: Hematology | Admitting: Internal Medicine

## 2018-03-18 VITALS — BP 139/66 | HR 96 | Temp 98.5°F | Resp 18 | Wt 170.6 lb

## 2018-03-18 DIAGNOSIS — Z809 Family history of malignant neoplasm, unspecified: Secondary | ICD-10-CM | POA: Diagnosis not present

## 2018-03-18 DIAGNOSIS — D473 Essential (hemorrhagic) thrombocythemia: Secondary | ICD-10-CM | POA: Diagnosis not present

## 2018-03-18 DIAGNOSIS — F17210 Nicotine dependence, cigarettes, uncomplicated: Secondary | ICD-10-CM

## 2018-03-18 DIAGNOSIS — R918 Other nonspecific abnormal finding of lung field: Secondary | ICD-10-CM | POA: Diagnosis not present

## 2018-03-18 DIAGNOSIS — D500 Iron deficiency anemia secondary to blood loss (chronic): Secondary | ICD-10-CM | POA: Diagnosis not present

## 2018-03-18 DIAGNOSIS — D72829 Elevated white blood cell count, unspecified: Secondary | ICD-10-CM | POA: Insufficient documentation

## 2018-03-18 DIAGNOSIS — J449 Chronic obstructive pulmonary disease, unspecified: Secondary | ICD-10-CM

## 2018-03-18 DIAGNOSIS — E119 Type 2 diabetes mellitus without complications: Secondary | ICD-10-CM

## 2018-03-18 DIAGNOSIS — N289 Disorder of kidney and ureter, unspecified: Secondary | ICD-10-CM | POA: Diagnosis not present

## 2018-03-18 DIAGNOSIS — I10 Essential (primary) hypertension: Secondary | ICD-10-CM

## 2018-03-18 NOTE — Progress Notes (Signed)
Diagnosis Iron deficiency anemia due to chronic blood loss - Plan: CBC with Differential/Platelet, Comprehensive metabolic panel, Lactate dehydrogenase, Ferritin  Abnormal findings on diagnostic imaging of lung - Plan: CT CHEST NODULE FOLLOW UP LOW DOSE WO CM, CBC with Differential/Platelet, Comprehensive metabolic panel, Lactate dehydrogenase, Ferritin  Staging Cancer Staging No matching staging information was found for the patient.  Assessment and Plan:  1.  IDA.  She was previously seen by Dr. Tressie Stalker for initial evaluation January 02, 2011.  She was last seen by him Jul 21, 2012 Per his note  #1 leukocytosis, it is felt to be benign, it is not progressing. And suspected to be reactive  #2 COPD with a 40 pack year history smoking, pulmonary nodules which are stable but she needs a repeat CT scan in one year.  Pt was treated with IV iron 08/15/2017.  Labs done 02/26/2018 reviewed and showed WBC 9.3 HB 12.5 plts 377,000.  Chemistries WNL with K+ 4.2 Cr 1.23 and normal LFTs.  Ferritin 116.  SPEP negative.  HB electrophoresis WNL.   Pt has been seen by GI and had endoscopy and colonoscopy done 02/26/2018 that showed internal hemorrhoids and gastritis.  No dysplasia or malignancy was seen.  Pt will RTC in 10/2018 with labs.    2.  Thrombocytosis.  Reactive due to anemia.  Labs done 02/26/2018 showed plt count of 377,000.  Will repeat labs in 10/2018.  3.  Pulmonary nodules.  This reportedly was discussed with the patient during her last evaluation with Dr. Tressie Stalker.  Last CT chest was done 07/09/2012 and showed 1.  Scattered small pulmonary nodules, at least two of which are not well seen on the prior examinations, possibly due to respiratory motion. Given risk factors for bronchogenic carcinoma, follow-up chest CT at 6 - 12 months is recommended.  This recommendation follows the consensus statement: Guidelines for Management of Small Pulmonary Nodules Detected on CT Scans: A Statement from  the [place] as published in Radiology 2005; 237:395-400.  Pt continues to smoke and low dose CT for screening was done 10/23/2017 which was reviewed and showed 1. Lung-RADS 2, benign appearance or behavior. Continue annual screening with low-dose chest CT without contrast in 12 months.  Pt is set up for CT chest in 10/2018 and will follow-up to go over results.   4.  Hypertension.  BP 139/66.  Follow-up with PCP.    5.  Renal insufficiency.  Creatinine 1.23 on labs done 02/19/2018.  SPEP negative.   Will repeat chemistries on RTC.    6.  Smoking.  Cessation is recommended. Pt set up for CT chest  for lung cancer screening in 10/2018.  She will follow-up at that time to go over results.    25 minutes spent with more than 50% spent in counseling and coordination of care.    Interval History:  Historical data obtained from the note dated 07/31/2017.  63 year old female referred by Dr. Criss Rosales for evaluation of anemia.  She had previously been seen by Dr. Tressie Stalker for initial evaluation January 02, 2011.  She was last seen by him Jul 21, 2012 For per his note  #1 leukocytosis, it is felt to be benign, it is not progressing. And suspected to be reactive  #2 COPD with a 40 pack year history smoking, pulmonary nodules which are stable but she needs a repeat CT scan in one year.  She had labs done March 13, 2017 that showed white count 9.2 hemoglobin 9.8 hematocrit 30.7 platelets  460,000 she had a normal differential.  Ferritin was 11 folate was 4.4.  12 was normal.  Chemistries were within normal limits other than a creatinine of 1.62 glucose was 57 potassium was 6.  She denies any blood in her stool or her urine.  She reportedly had a colonoscopy 5 years ago.  She reports some fatigue.  She continues to smoke a half a pack per day.    Current Status:  Pt is seen today for follow-up.  She reports she felt better after IV iron.     Problem List Patient Active Problem List   Diagnosis  Date Noted  . Iron deficiency anemia [D50.9] 08/01/2017  . Displaced supracondylar fracture without intracondylar extension of lower end of right femur, initial encounter for closed fracture ([place]) [S72.451A] 05/23/2016  . 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 [M97.11XA]   . Closed fracture of right distal femur ([place]) [S72.401A]   . 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 Pulmonary nodules/lesions, multiple [R91.8] 08/09/2013  . HLD (hyperlipidemia) [E78.5] 01/22/2012  . Pulmonary hypertension ([place]) [I27.20] 01/08/2012  . Acute on chronic diastolic heart failure ([place]) [I50.33] 01/05/2012  . Pulmonary edema, acute ([place]) [J81.0] 01/03/2012  . 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 HTN (hypertension) [I10] 01/02/2012  . COPD with acute exacerbation ([place]) [J44.1] 01/02/2012  . Tobacco abuse [Z72.0] 07/04/2011  . 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 Leukocytosis [D72.829] 02/09/2011  . Mild Thrombocytosis [D47.3] 02/09/2011  . Obesity [E66.9] 02/09/2011    Past Medical History Past Medical History:  Diagnosis Date  . Anemia   . Asthma   . CHF (congestive heart failure) (HCC)    takes Furosemide daily   . COPD (chronic obstructive pulmonary disease) (HCC)    Albuterol inhaler prn;SIngulair at night  . Depression    takes Wellbutrin daily  . Diabetes mellitus    takes Metformin daily  . GERD (gastroesophageal reflux disease)    takes Nexium daily  . History of colon polyps    benign  . Hypertension    takes Lisinopril daily  . Iron deficiency anemia 08/01/2017  . Joint pain   . Joint swelling   . Leukocytosis 02/09/2011  . Neck pain    HNP  . Normocytic anemia 02/09/2011  . Peripheral edema    takes Furosemide daily  . Pneumonia    many yrs ago  . Pulmonary nodules/lesions, multiple 08/09/2013  .  Shortness of breath dyspnea    with exertion  . Thrombocytosis ([place]) 02/09/2011  . Urinary frequency   . Urinary urgency   . Weakness    numbness and tingling in both hands    Past Surgical History Past Surgical History:  Procedure Laterality Date  . ANTERIOR CERVICAL DECOMP/DISCECTOMY FUSION N/A 11/17/2014   Procedure: Cervical four- cervical five Anterior Cervical Decompression with fusion and bonegraft;  Surgeon: Ashok Pall, MD;  Location: [facility];  Service: Neurosurgery;  Laterality: N/A;  C45 anterior cervical decompression with fusion plating and bonegraft  . BIOPSY  02/26/2018   Procedure: BIOPSY;  Surgeon: Danie Binder, MD;  [facility];  Service: Endoscopy;;  (colon) Gastric  duodenal  . COLONOSCOPY    . COLONOSCOPY N/A 02/26/2018   Procedure: COLONOSCOPY;  Surgeon: Danie Binder, MD;  [facility];  Service: Endoscopy;  Laterality: N/A;  9:30am  . ESOPHAGOGASTRODUODENOSCOPY N/A  02/26/2018   Procedure: ESOPHAGOGASTRODUODENOSCOPY (EGD);  Surgeon: Danie Binder, MD;  [facility];  Service: Endoscopy;  Laterality: N/A;  . FEMUR IM NAIL Right 05/23/2016  . FEMUR IM NAIL Right 05/23/2016   Procedure: INTRAMEDULLARY (IM) RETROGRADE FEMORAL NAILING;  Surgeon: [name], MD;  Location: [facility];  Service: Orthopedics;  Laterality: Right;  . KNEE ARTHROPLASTY Left 08/10/2013   Procedure:  TOTAL KNEE ARTHROPLASTY;  Surgeon: [name], MD;  Location: [facility];  Service: Orthopedics;  Laterality: Left;  Left Total Knee Arthroplasty Revision, Cemented, Semi-constrained,   . LEFT AND RIGHT HEART CATHETERIZATION WITH CORONARY ANGIOGRAM N/A 01/07/2012   Procedure: LEFT AND RIGHT HEART CATHETERIZATION WITH CORONARY ANGIOGRAM;  Surgeon: Thayer Headings, MD;  [facility];  Service: Cardiovascular;  Laterality: N/A;  . TONSILLECTOMY     as a child  . TOTAL KNEE ARTHROPLASTY Bilateral     Family History Family History  Problem Relation  Age of Onset  . Heart failure Mother   . Cancer Maternal Grandmother   . Colon cancer Neg Hx   . Colon polyps Neg Hx      Social History  reports that she has been smoking cigarettes. She has a 20.50 pack-year smoking history. She has never used smokeless tobacco. She reports current alcohol use. She reports that she does not use drugs.  Medications  Current Outpatient Medications:  .  amLODipine (NORVASC) 5 MG tablet, Take 5 mg by mouth at bedtime. , Disp: , Rfl:  .  aspirin EC 81 MG tablet, Take 81 mg by mouth daily., Disp: , Rfl:  .  buPROPion (WELLBUTRIN XL) 150 MG 24 hr tablet, Take 150 mg by mouth daily., Disp: , Rfl:  .  celecoxib (CELEBREX) 200 MG capsule, Take 200 mg by mouth daily. , Disp: , Rfl:  .  esomeprazole (NEXIUM) 40 MG capsule, Take 40 mg by mouth daily before breakfast. , Disp: , Rfl:  .  furosemide (LASIX) 20 MG tablet, Take 1 tablet (20 mg total) by mouth every other day. (Patient taking differently: Take 20 mg by mouth daily. ), Disp: 30 tablet, Rfl: 11 .  metFORMIN (GLUCOPHAGE) 500 MG tablet, Take 500 mg by mouth 3 (three) times daily., Disp: , Rfl:  .  montelukast (SINGULAIR) 10 MG tablet, Take 10 mg by mouth at bedtime., Disp: , Rfl:  .  ZORVOLEX 35 MG CAPS, Take 35 mg by mouth 3 (three) times daily., Disp: , Rfl:   Allergies Patient has no known allergies.  Review of Systems Review of Systems - Oncology ROS negative   Physical Exam  Vitals Wt Readings from Last 3 Encounters:  03/18/18 170 lb 9 oz (77.4 kg)  02/26/18 165 lb (74.8 kg)  12/25/17 171 lb 9.6 oz (77.8 kg)   Temp Readings from Last 3 Encounters:  03/18/18 98.5 F (36.9 C) (Oral)  02/26/18 97.8 F (36.6 C) (Oral)  12/25/17 (!) 97 F (36.1 C) (Oral)   BP Readings from Last 3 Encounters:  03/18/18 139/66  02/26/18 120/83  12/25/17 (!) 157/77   Pulse Readings from Last 3 Encounters:  03/18/18 96  02/26/18 88  12/25/17 77   Constitutional: Well-developed, well-nourished, and in  no distress.   HENT: Head: Normocephalic and atraumatic.  Mouth/Throat: No oropharyngeal exudate. Mucosa moist. Eyes: Pupils are equal, round, and reactive to light. Conjunctivae are normal. No scleral icterus.  Neck: Normal range of motion. Neck supple. No JVD present.  Cardiovascular: Normal rate, regular rhythm  and normal heart sounds.  Exam reveals no gallop and no friction rub.   No murmur heard. Pulmonary/Chest: Effort normal and breath sounds normal. No respiratory distress. No wheezes.No rales.  Abdominal: Soft. Bowel sounds are normal. No distension. There is no tenderness. There is no guarding.  Musculoskeletal: No edema or tenderness.  Lymphadenopathy: No cervical, axillary or supraclavicular adenopathy.  Neurological: Alert and oriented to person, place, and time. No cranial nerve deficit.  Skin: Skin is warm and dry. No rash noted. No erythema. No pallor.  Psychiatric: Affect and judgment normal.   Labs No visits with results within 3 Day(s) from this visit.  Latest known visit with results is:  Admission on 02/26/2018, Discharged on 02/26/2018  Component Date Value Ref Range Status  . TSH 02/26/2018 1.036  0.350 - 4.500 uIU/mL Final   Comment: Performed by a 3rd Generation assay with a functional sensitivity of <=0.01 uIU/mL. Performed at [facility], [address], [place], [place] [address]   . Glucose-Capillary 02/26/2018 65* 70 - 99 mg/dL Final  . Glucose-Capillary 02/26/2018 180* 70 - 99 mg/dL Final     Pathology Orders Placed This Encounter  Procedures  . CT CHEST NODULE FOLLOW UP LOW DOSE WO CM    Standing Status:   Future    Standing Expiration Date:   03/19/2019    Order Specific Question:   Is patient pregnant?    Answer:   No    Order Specific Question:   Preferred imaging location?    Answer:   [facility]    Order Specific Question:   Radiology Contrast Protocol - do NOT remove file path    Answer:   [email or web address]   . CBC with Differential/Platelet    Standing Status:   Future    Standing Expiration Date:   03/19/2019  . Comprehensive metabolic panel    Standing Status:   Future    Standing Expiration Date:   03/19/2019  . Lactate dehydrogenase    Standing Status:   Future    Standing Expiration Date:   03/19/2019  . Ferritin    Standing Status:   Future    Standing Expiration Date:   03/19/2019       Zoila Shutter MD

## 2018-03-21 DIAGNOSIS — E1169 Type 2 diabetes mellitus with other specified complication: Secondary | ICD-10-CM | POA: Diagnosis not present

## 2018-03-21 DIAGNOSIS — E785 Hyperlipidemia, unspecified: Secondary | ICD-10-CM | POA: Diagnosis not present

## 2018-03-21 DIAGNOSIS — I10 Essential (primary) hypertension: Secondary | ICD-10-CM | POA: Diagnosis not present

## 2018-03-24 ENCOUNTER — Ambulatory Visit (HOSPITAL_COMMUNITY)
Admission: RE | Admit: 2018-03-24 | Discharge: 2018-03-24 | Disposition: A | Discharge status: Home / Self Care | Payer: Medicare Other | Source: Ambulatory Visit | Attending: Gastroenterology | Admitting: Gastroenterology

## 2018-03-24 DIAGNOSIS — K559 Vascular disorder of intestine, unspecified: Secondary | ICD-10-CM | POA: Diagnosis not present

## 2018-03-24 DIAGNOSIS — I700 Atherosclerosis of aorta: Secondary | ICD-10-CM | POA: Diagnosis not present

## 2018-03-24 MED ORDER — IOPAMIDOL (ISOVUE-370) INJECTION 76%
100.0000 mL | Freq: Once | INTRAVENOUS | Status: AC | PRN
  Administered 2018-03-24: 100 mL via INTRAVENOUS

## 2018-03-25 ENCOUNTER — Telehealth: Payer: Self-pay | Admitting: Gastroenterology

## 2018-03-25 NOTE — Telephone Encounter (Signed)
Pt is aware.  

## 2018-03-25 NOTE — Telephone Encounter (Signed)
PLEASE CALL PT. SHE HAS HARDENING OF THE ARTERIES IN HER ABDOMEN BUT NO CLINICALLY SIGNIFICANT BLOCKAGES. 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

## 2018-07-21 DIAGNOSIS — E1169 Type 2 diabetes mellitus with other specified complication: Secondary | ICD-10-CM | POA: Diagnosis not present

## 2018-07-21 DIAGNOSIS — E785 Hyperlipidemia, unspecified: Secondary | ICD-10-CM | POA: Diagnosis not present

## 2018-07-21 DIAGNOSIS — I10 Essential (primary) hypertension: Secondary | ICD-10-CM | POA: Diagnosis not present

## 2018-07-24 DIAGNOSIS — T84498A Other mechanical complication of other internal orthopedic devices, implants and grafts, initial encounter: Secondary | ICD-10-CM | POA: Diagnosis not present

## 2018-07-24 DIAGNOSIS — D473 Essential (hemorrhagic) thrombocythemia: Secondary | ICD-10-CM | POA: Diagnosis not present

## 2018-07-24 DIAGNOSIS — R262 Difficulty in walking, not elsewhere classified: Secondary | ICD-10-CM | POA: Diagnosis not present

## 2018-07-24 DIAGNOSIS — M6281 Muscle weakness (generalized): Secondary | ICD-10-CM | POA: Diagnosis not present

## 2018-10-23 ENCOUNTER — Other Ambulatory Visit (HOSPITAL_COMMUNITY): Payer: Self-pay | Admitting: *Deleted

## 2018-10-23 DIAGNOSIS — R918 Other nonspecific abnormal finding of lung field: Secondary | ICD-10-CM

## 2018-10-23 DIAGNOSIS — D500 Iron deficiency anemia secondary to blood loss (chronic): Secondary | ICD-10-CM

## 2018-10-23 DIAGNOSIS — E538 Deficiency of other specified B group vitamins: Secondary | ICD-10-CM

## 2018-10-23 DIAGNOSIS — D508 Other iron deficiency anemias: Secondary | ICD-10-CM

## 2018-10-24 ENCOUNTER — Inpatient Hospital Stay (HOSPITAL_COMMUNITY): Payer: Medicare Other | Attending: Hematology

## 2018-10-24 ENCOUNTER — Other Ambulatory Visit (HOSPITAL_COMMUNITY): Payer: Self-pay | Admitting: Internal Medicine

## 2018-10-24 ENCOUNTER — Ambulatory Visit (HOSPITAL_COMMUNITY)
Admission: RE | Admit: 2018-10-24 | Discharge: 2018-10-24 | Disposition: A | Discharge status: Home / Self Care | Payer: Medicare Other | Source: Ambulatory Visit | Attending: Internal Medicine | Admitting: Internal Medicine

## 2018-10-24 ENCOUNTER — Other Ambulatory Visit: Payer: Self-pay

## 2018-10-24 DIAGNOSIS — D509 Iron deficiency anemia, unspecified: Secondary | ICD-10-CM | POA: Insufficient documentation

## 2018-10-24 DIAGNOSIS — I2510 Atherosclerotic heart disease of native coronary artery without angina pectoris: Secondary | ICD-10-CM | POA: Diagnosis not present

## 2018-10-24 DIAGNOSIS — J449 Chronic obstructive pulmonary disease, unspecified: Secondary | ICD-10-CM | POA: Insufficient documentation

## 2018-10-24 DIAGNOSIS — Z122 Encounter for screening for malignant neoplasm of respiratory organs: Secondary | ICD-10-CM | POA: Diagnosis not present

## 2018-10-24 DIAGNOSIS — R918 Other nonspecific abnormal finding of lung field: Secondary | ICD-10-CM | POA: Insufficient documentation

## 2018-10-24 DIAGNOSIS — F329 Major depressive disorder, single episode, unspecified: Secondary | ICD-10-CM | POA: Insufficient documentation

## 2018-10-24 DIAGNOSIS — I10 Essential (primary) hypertension: Secondary | ICD-10-CM | POA: Insufficient documentation

## 2018-10-24 DIAGNOSIS — J432 Centrilobular emphysema: Secondary | ICD-10-CM | POA: Diagnosis not present

## 2018-10-24 DIAGNOSIS — F17210 Nicotine dependence, cigarettes, uncomplicated: Secondary | ICD-10-CM | POA: Diagnosis not present

## 2018-10-24 DIAGNOSIS — I700 Atherosclerosis of aorta: Secondary | ICD-10-CM | POA: Insufficient documentation

## 2018-10-24 DIAGNOSIS — Z7984 Long term (current) use of oral hypoglycemic drugs: Secondary | ICD-10-CM | POA: Diagnosis not present

## 2018-10-24 DIAGNOSIS — E119 Type 2 diabetes mellitus without complications: Secondary | ICD-10-CM | POA: Insufficient documentation

## 2018-10-24 DIAGNOSIS — D508 Other iron deficiency anemias: Secondary | ICD-10-CM

## 2018-10-24 DIAGNOSIS — Z7982 Long term (current) use of aspirin: Secondary | ICD-10-CM | POA: Insufficient documentation

## 2018-10-24 DIAGNOSIS — Z79899 Other long term (current) drug therapy: Secondary | ICD-10-CM | POA: Insufficient documentation

## 2018-10-24 DIAGNOSIS — D500 Iron deficiency anemia secondary to blood loss (chronic): Secondary | ICD-10-CM

## 2018-10-24 DIAGNOSIS — E538 Deficiency of other specified B group vitamins: Secondary | ICD-10-CM

## 2018-10-24 LAB — CBC WITH DIFFERENTIAL/PLATELET
Abs Immature Granulocytes: 0.02 10*3/uL (ref 0.00–0.07)
Basophils Absolute: 0.1 10*3/uL (ref 0.0–0.1)
Basophils Relative: 1 %
Eosinophils Absolute: 0.4 10*3/uL (ref 0.0–0.5)
Eosinophils Relative: 4 %
HCT: 40.6 % (ref 36.0–46.0)
Hemoglobin: 13.1 g/dL (ref 12.0–15.0)
Immature Granulocytes: 0 %
Lymphocytes Relative: 29 %
Lymphs Abs: 2.7 10*3/uL (ref 0.7–4.0)
MCH: 30.6 pg (ref 26.0–34.0)
MCHC: 32.3 g/dL (ref 30.0–36.0)
MCV: 94.9 fL (ref 80.0–100.0)
Monocytes Absolute: 0.5 10*3/uL (ref 0.1–1.0)
Monocytes Relative: 5 %
Neutro Abs: 5.8 10*3/uL (ref 1.7–7.7)
Neutrophils Relative %: 61 %
Platelets: 328 10*3/uL (ref 150–400)
RBC: 4.28 MIL/uL (ref 3.87–5.11)
RDW: 14.7 % (ref 11.5–15.5)
WBC: 9.5 10*3/uL (ref 4.0–10.5)
nRBC: 0 % (ref 0.0–0.2)

## 2018-10-24 LAB — COMPREHENSIVE METABOLIC PANEL
ALT: 12 U/L (ref 0–44)
AST: 14 U/L — ABNORMAL LOW (ref 15–41)
Albumin: 4.1 g/dL (ref 3.5–5.0)
Alkaline Phosphatase: 104 U/L (ref 38–126)
Anion gap: 12 (ref 5–15)
BUN: 18 mg/dL (ref 8–23)
CO2: 22 mmol/L (ref 22–32)
Calcium: 8.5 mg/dL — ABNORMAL LOW (ref 8.9–10.3)
Chloride: 103 mmol/L (ref 98–111)
Creatinine, Ser: 0.94 mg/dL (ref 0.44–1.00)
GFR calc Af Amer: >60 mL/min (ref >60)
GFR calc non Af Amer: >60 mL/min (ref >60)
Glucose, Bld: 76 mg/dL (ref 70–99)
Potassium: 4.6 mmol/L (ref 3.5–5.1)
Sodium: 137 mmol/L (ref 135–145)
Total Bilirubin: 0.5 mg/dL (ref 0.3–1.2)
Total Protein: 7.7 g/dL (ref 6.5–8.1)

## 2018-10-24 LAB — FERRITIN: Ferritin: 42 ng/mL (ref 11–307)

## 2018-10-24 LAB — LACTATE DEHYDROGENASE: LDH: 141 U/L (ref 98–192)

## 2018-10-29 ENCOUNTER — Inpatient Hospital Stay (HOSPITAL_BASED_OUTPATIENT_CLINIC_OR_DEPARTMENT_OTHER): Payer: Medicare Other | Admitting: Hematology

## 2018-10-29 ENCOUNTER — Other Ambulatory Visit: Payer: Self-pay

## 2018-10-29 ENCOUNTER — Encounter (HOSPITAL_COMMUNITY): Payer: Self-pay | Admitting: Hematology

## 2018-10-29 DIAGNOSIS — I10 Essential (primary) hypertension: Secondary | ICD-10-CM | POA: Diagnosis not present

## 2018-10-29 DIAGNOSIS — Z79899 Other long term (current) drug therapy: Secondary | ICD-10-CM | POA: Diagnosis not present

## 2018-10-29 DIAGNOSIS — Z7982 Long term (current) use of aspirin: Secondary | ICD-10-CM | POA: Diagnosis not present

## 2018-10-29 DIAGNOSIS — R918 Other nonspecific abnormal finding of lung field: Secondary | ICD-10-CM | POA: Diagnosis not present

## 2018-10-29 DIAGNOSIS — D509 Iron deficiency anemia, unspecified: Secondary | ICD-10-CM

## 2018-10-29 DIAGNOSIS — Z7984 Long term (current) use of oral hypoglycemic drugs: Secondary | ICD-10-CM | POA: Diagnosis not present

## 2018-10-29 DIAGNOSIS — E119 Type 2 diabetes mellitus without complications: Secondary | ICD-10-CM | POA: Diagnosis not present

## 2018-10-29 DIAGNOSIS — J449 Chronic obstructive pulmonary disease, unspecified: Secondary | ICD-10-CM | POA: Diagnosis not present

## 2018-10-29 NOTE — Progress Notes (Signed)
Debbie Mullins, Debbie Mullins   CLINIC:  Medical Oncology/Hematology  PCP:  Lucianne Lei, [address] [place] [address] [phone]   REASON FOR VISIT:  Follow-up for iron deficiency anemia and lung nodules.    INTERVAL HISTORY:  Debbie Mullins 63 y.o. female seen for follow-up of iron deficiency state.  Denies any bleeding per rectum or melena.  Appetite and energy levels are 50%.  No pain is reported.  She is continuing to smoke about half pack per day.  She has cut back on smoking as she claims it has become too expensive.  She started smoking around age 34 and smoked 1 pack/day for most part of her life.  She walks with help of walker as she had 4 knee surgeries.  Mild fatigue has been stable.  Leg swellings are also stable.  Denies any change in bowel habits.  Denies any nausea, vomiting or diarrhea or constipation.    REVIEW OF SYSTEMS:  Review of Systems  Constitutional: Positive for fatigue.  Cardiovascular: Positive for leg swelling.  All other systems reviewed and are negative.    PAST MEDICAL/SURGICAL HISTORY:  Past Medical History:  Diagnosis Date  . Anemia   . Asthma   . CHF (congestive heart failure) (HCC)    takes Furosemide daily   . COPD (chronic obstructive pulmonary disease) (HCC)    Albuterol inhaler prn;SIngulair at night  . Depression    takes Wellbutrin daily  . Diabetes mellitus    takes Metformin daily  . GERD (gastroesophageal reflux disease)    takes Nexium daily  . History of colon polyps    benign  . Hypertension    takes Lisinopril daily  . Iron deficiency anemia 08/01/2017  . Joint pain   . Joint swelling   . Leukocytosis 02/09/2011  . Neck pain    HNP  . Normocytic anemia 02/09/2011  . Peripheral edema    takes Furosemide daily  . Pneumonia    many yrs ago  . Pulmonary nodules/lesions, multiple 08/09/2013  . Shortness of breath dyspnea    with exertion  . Thrombocytosis  ([place]) 02/09/2011  . Urinary frequency   . Urinary urgency   . Weakness    numbness and tingling in both hands   Past Surgical History:  Procedure Laterality Date  . ANTERIOR CERVICAL DECOMP/DISCECTOMY FUSION N/A 11/17/2014   Procedure: Cervical four- cervical five Anterior Cervical Decompression with fusion and bonegraft;  Surgeon: Ashok Pall, MD;  Location: [facility];  Service: Neurosurgery;  Laterality: N/A;  C45 anterior cervical decompression with fusion plating and bonegraft  . BIOPSY  02/26/2018   Procedure: BIOPSY;  Surgeon: Danie Binder, MD;  [facility];  Service: Endoscopy;;  (colon) Gastric  duodenal  . COLONOSCOPY    . COLONOSCOPY N/A 02/26/2018   Procedure: COLONOSCOPY;  Surgeon: Danie Binder, MD;  [facility];  Service: Endoscopy;  Laterality: N/A;  9:30am  . ESOPHAGOGASTRODUODENOSCOPY N/A 02/26/2018   Procedure: ESOPHAGOGASTRODUODENOSCOPY (EGD);  Surgeon: Danie Binder, MD;  [facility];  Service: Endoscopy;  Laterality: N/A;  . FEMUR IM NAIL Right 05/23/2016  . FEMUR IM NAIL Right 05/23/2016   Procedure: INTRAMEDULLARY (IM) RETROGRADE FEMORAL NAILING;  Surgeon: [name], MD;  Location: [facility];  Service: Orthopedics;  Laterality: Right;  . KNEE ARTHROPLASTY Left 08/10/2013   Procedure:  TOTAL KNEE ARTHROPLASTY;  Surgeon: [name], MD;  [facility];  Service: Orthopedics;  Laterality: Left;  Left Total Knee Arthroplasty Revision, Cemented, Semi-constrained,   . LEFT AND RIGHT HEART CATHETERIZATION WITH CORONARY ANGIOGRAM N/A 01/07/2012   Procedure: LEFT AND RIGHT HEART CATHETERIZATION WITH CORONARY ANGIOGRAM;  Surgeon: Thayer Headings, MD;  Location: [facility] CATH LAB;  Service: Cardiovascular;  Laterality: N/A;  . TONSILLECTOMY     as a child  . TOTAL KNEE ARTHROPLASTY Bilateral      SOCIAL HISTORY:  Social History   Socioeconomic History  . Marital status: Single    Spouse name: Not on file  . Number of children:  Not on file  . Years of education: Not on file  . Highest education level: Not on file  Occupational History  . Not on file  Social Needs  . Financial resource strain: Not on file  . Food insecurity    Worry: Not on file    Inability: Not on file  . Transportation needs    Medical: Not on file    Non-medical: Not on file  Tobacco Use  . Smoking status: Current Every Day Smoker    Packs/day: 0.50    Years: 41.00    Pack years: 20.50    Types: Cigarettes  . Smokeless tobacco: Never Used  Substance and Sexual Activity  . Alcohol use: Yes    Comment: wine  . Drug use: No  . Sexual activity: Never  Lifestyle  . Physical activity    Days per week: Not on file    Minutes per session: Not on file  . Stress: Not on file  Relationships  . Social Herbalist on phone: Not on file    Gets together: Not on file    Attends religious service: Not on file    Active member of club or organization: Not on file    Attends meetings of clubs or organizations: Not on file    Relationship status: Not on file  . Intimate partner violence    Fear of current or ex partner: Not on file    Emotionally abused: Not on file    Physically abused: Not on file    Forced sexual activity: Not on file  Other Topics Concern  . Not on file  Social History Narrative   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 COUSINS TRANSPORTS HER.    FAMILY HISTORY:  Family History  Problem Relation Age of Onset  . Heart failure Mother   . Cancer Maternal Grandmother   . Colon cancer Neg Hx   . Colon polyps Neg Hx     CURRENT MEDICATIONS:  Outpatient Encounter Medications as of 10/29/2018  Medication Sig  . amLODipine (NORVASC) 5 MG tablet Take 5 mg by mouth at bedtime.   Marland Kitchen aspirin EC 81 MG tablet Take 81 mg by mouth daily.  Marland Kitchen buPROPion (WELLBUTRIN XL) 150 MG 24 hr tablet Take 150 mg by mouth daily.  . celecoxib (CELEBREX) 200 MG capsule Take 200 mg by mouth daily.   .  Clobetasol Prop Emollient Base 0.05 % emollient cream   . esomeprazole (NEXIUM) 40 MG capsule Take 40 mg by mouth daily before breakfast.   . furosemide (LASIX) 20 MG tablet Take 1 tablet (20 mg total) by mouth every other day. (Patient taking differently: Take 20 mg by mouth daily. )  . metFORMIN (GLUCOPHAGE) 500 MG tablet Take 500 mg by mouth 3 (three) times daily.  . montelukast (SINGULAIR) 10  MG tablet Take 10 mg by mouth at bedtime.  Marland Kitchen ZORVOLEX 35 MG CAPS Take 35 mg by mouth 3 (three) times daily.   No facility-administered encounter medications on file as of 10/29/2018.     ALLERGIES:  No Known Allergies   PHYSICAL EXAM:  ECOG Performance status: 1  Vitals:   10/29/18 1400  BP: (!) 164/66  Pulse: 78  Resp: 20  Temp: 97.7 F (36.5 C)  SpO2: 93%   Filed Weights   10/29/18 1400  Weight: 176 lb 8 oz (80.1 kg)    Physical Exam Vitals signs reviewed.  Constitutional:      Appearance: Normal appearance.  Cardiovascular:     Rate and Rhythm: Normal rate and regular rhythm.     Heart sounds: Normal heart sounds.  Pulmonary:     Effort: Pulmonary effort is normal.     Breath sounds: Normal breath sounds.  Abdominal:     General: There is no distension.     Palpations: Abdomen is soft. There is no mass.  Musculoskeletal:     Right lower leg: Edema present.     Left lower leg: Edema present.  Skin:    General: Skin is warm.  Neurological:     General: No focal deficit present.     Mental Status: She is alert and oriented to person, place, and time.  Psychiatric:        Mood and Affect: Mood normal.        Behavior: Behavior normal.      LABORATORY DATA:  I have reviewed the labs as listed.  CBC    Component Value Date/Time   WBC 9.5 10/24/2018 1125   RBC 4.28 10/24/2018 1125   HGB 13.1 10/24/2018 1125   HCT 40.6 10/24/2018 1125   PLT 328 10/24/2018 1125   MCV 94.9 10/24/2018 1125   MCH 30.6 10/24/2018 1125   MCHC 32.3 10/24/2018 1125   RDW 14.7  10/24/2018 1125   LYMPHSABS 2.7 10/24/2018 1125   MONOABS 0.5 10/24/2018 1125   EOSABS 0.4 10/24/2018 1125   BASOSABS 0.1 10/24/2018 1125   CMP Latest Ref Rng & Units 10/24/2018 02/19/2018 07/31/2017  Glucose 70 - 99 mg/dL 76 162(H) 75  BUN 8 - 23 mg/dL 18 25(H) 32(H)  Creatinine 0.44 - 1.00 mg/dL 0.94 1.23(H) 1.46(H)  Sodium 135 - 145 mmol/L 137 135 137  Potassium 3.5 - 5.1 mmol/L 4.6 4.2 4.8  Chloride 98 - 111 mmol/L 103 103 100(L)  CO2 22 - 32 mmol/L 22 20(L) 25  Calcium 8.9 - 10.3 mg/dL 8.5(L) 8.8(L) 8.7(L)  Total Protein 6.5 - 8.1 g/dL 7.7 8.1 7.9  Total Bilirubin 0.3 - 1.2 mg/dL 0.5 0.5 0.6  Alkaline Phos 38 - 126 U/L 104 108 96  AST 15 - 41 U/L 14(L) 21 18  ALT 0 - 44 U/L 12 14 13(L)       DIAGNOSTIC IMAGING:  I have independently reviewed the scans and discussed with the patient.   I have reviewed Venita Lick LPN's note and agree with the documentation.  I personally performed a face-to-face visit, made revisions and my assessment and plan is as follows.    ASSESSMENT & PLAN:   Iron deficiency anemia 1.  Iron deficiency anemia: - Colonoscopy on 02/26/2017 showed normal ileum, normal colon, internal hemorrhoids. -EGD on 02/26/2018 shows gastritis, large hiatal hernia. -Injectafer on 08/08/2017 and 08/15/2016. -We reviewed blood work from 10/24/2018.  Hemoglobin is 13.1 and MCV is 94.9. -Ferritin is 42, down  from 116 in December 2019. - I have recommended her to try oral iron 1 tablet daily.  We discussed side effects including constipation.  She will take stool softener with it. -I plan to repeat ferritin, iron panel and CBC in 4 months.  If there is no improvement, will consider giving parenteral iron therapy.  2.  Lung nodules: -Current active smoker, reports trying to quit and smoking half pack per day.  She started smoking around age 85 and had smoked 1 pack/day most part of her life.  I have recommended smoking cessation. - CT lung cancer screen on 10/24/2018  shows multiple small pulmonary nodules noted in the lungs bilaterally.  Lung RADS 2 years with follow-up low-dose chest CT without contrast in 12 months.   Total time spent is 25 minutes with more than 50% of the time spent face-to-face discussing lab results, counseling and coordination of care.  Orders placed this encounter:  Orders Placed This Encounter  Procedures  . CBC with Differential/Platelet  . Comprehensive metabolic panel  . Iron and TIBC  . Ferritin  . Vitamin B12  . Folate      Derek Jack, MD [place] [phone]

## 2018-10-29 NOTE — Patient Instructions (Signed)
[place] at [facility] Discharge Instructions  You were seen today by Dr. Delton Coombes. He went over your recent lab results. He will see you back in 4 months for labs and follow up.  He would like you to start taking an over the counter iron supplement as well as a stool softener daily.  Thank you for choosing [place] at [facility] to provide your oncology and hematology care.  To afford each patient quality time with our provider, please arrive at least 15 minutes before your scheduled appointment time.   If you have a lab appointment with the [place] please come in thru the  Main Entrance and check in at the main information desk  You need to re-schedule your appointment should you arrive 10 or more minutes late.  We strive to give you quality time with our providers, and arriving late affects you and other patients whose appointments are after yours.  Also, if you no show three or more times for appointments you may be dismissed from the clinic at the providers discretion.     Again, thank you for choosing [facility].  Our hope is that these requests will decrease the amount of time that you wait before being seen by our physicians.       _____________________________________________________________  Should you have questions after your visit to [facility], please contact our office at [phone] between the hours of 8:00 a.m. and 4:30 p.m.  Voicemails left after 4:00 p.m. will not be returned until the following business day.  For prescription refill requests, have your pharmacy contact our office and allow 72 hours.    Cancer Center Support Programs:   > Cancer Support Group  2nd Tuesday of the month 1pm-2pm, Journey Room

## 2018-10-29 NOTE — Assessment & Plan Note (Addendum)
1.  Iron deficiency anemia: - Colonoscopy on 02/26/2017 showed normal ileum, normal colon, internal hemorrhoids. -EGD on 02/26/2018 shows gastritis, large hiatal hernia. -Injectafer on 08/08/2017 and 08/15/2016. -We reviewed blood work from 10/24/2018.  Hemoglobin is 13.1 and MCV is 94.9. -Ferritin is 42, down from 116 in December 2019. - I have recommended her to try oral iron 1 tablet daily.  We discussed side effects including constipation.  She will take stool softener with it. -I plan to repeat ferritin, iron panel and CBC in 4 months.  If there is no improvement, will consider giving parenteral iron therapy.  2.  Lung nodules: -Current active smoker, reports trying to quit and smoking half pack per day.  She started smoking around age 64 and had smoked 1 pack/day most part of her life.  I have recommended smoking cessation. - CT lung cancer screen on 10/24/2018 shows multiple small pulmonary nodules noted in the lungs bilaterally.  Lung RADS 2 years with follow-up low-dose chest CT without contrast in 12 months.

## 2018-12-15 ENCOUNTER — Emergency Department (HOSPITAL_COMMUNITY): Payer: Medicare Other

## 2018-12-15 ENCOUNTER — Other Ambulatory Visit: Payer: Self-pay

## 2018-12-15 ENCOUNTER — Encounter (HOSPITAL_COMMUNITY): Payer: Self-pay

## 2018-12-15 ENCOUNTER — Inpatient Hospital Stay (HOSPITAL_COMMUNITY)
Admission: EM | Admit: 2018-12-15 | Discharge: 2018-12-18 | DRG: 190 | Disposition: A | Discharge status: Home Health | Payer: Medicare Other | Attending: Internal Medicine | Admitting: Internal Medicine

## 2018-12-15 DIAGNOSIS — Z20828 Contact with and (suspected) exposure to other viral communicable diseases: Secondary | ICD-10-CM | POA: Diagnosis present

## 2018-12-15 DIAGNOSIS — J9601 Acute respiratory failure with hypoxia: Secondary | ICD-10-CM | POA: Diagnosis present

## 2018-12-15 DIAGNOSIS — I2510 Atherosclerotic heart disease of native coronary artery without angina pectoris: Secondary | ICD-10-CM | POA: Diagnosis not present

## 2018-12-15 DIAGNOSIS — I130 Hypertensive heart and chronic kidney disease with heart failure and stage 1 through stage 4 chronic kidney disease, or unspecified chronic kidney disease: Secondary | ICD-10-CM | POA: Diagnosis present

## 2018-12-15 DIAGNOSIS — R0902 Hypoxemia: Secondary | ICD-10-CM

## 2018-12-15 DIAGNOSIS — J9600 Acute respiratory failure, unspecified whether with hypoxia or hypercapnia: Secondary | ICD-10-CM | POA: Diagnosis present

## 2018-12-15 DIAGNOSIS — Z96653 Presence of artificial knee joint, bilateral: Secondary | ICD-10-CM | POA: Diagnosis present

## 2018-12-15 DIAGNOSIS — I10 Essential (primary) hypertension: Secondary | ICD-10-CM | POA: Diagnosis not present

## 2018-12-15 DIAGNOSIS — Z7984 Long term (current) use of oral hypoglycemic drugs: Secondary | ICD-10-CM | POA: Diagnosis not present

## 2018-12-15 DIAGNOSIS — Z7982 Long term (current) use of aspirin: Secondary | ICD-10-CM

## 2018-12-15 DIAGNOSIS — Z809 Family history of malignant neoplasm, unspecified: Secondary | ICD-10-CM

## 2018-12-15 DIAGNOSIS — U071 COVID-19: Secondary | ICD-10-CM | POA: Diagnosis not present

## 2018-12-15 DIAGNOSIS — E785 Hyperlipidemia, unspecified: Secondary | ICD-10-CM | POA: Diagnosis not present

## 2018-12-15 DIAGNOSIS — J9621 Acute and chronic respiratory failure with hypoxia: Secondary | ICD-10-CM | POA: Diagnosis present

## 2018-12-15 DIAGNOSIS — Z8249 Family history of ischemic heart disease and other diseases of the circulatory system: Secondary | ICD-10-CM | POA: Diagnosis not present

## 2018-12-15 DIAGNOSIS — F17210 Nicotine dependence, cigarettes, uncomplicated: Secondary | ICD-10-CM | POA: Diagnosis present

## 2018-12-15 DIAGNOSIS — I272 Pulmonary hypertension, unspecified: Secondary | ICD-10-CM | POA: Diagnosis not present

## 2018-12-15 DIAGNOSIS — Z720 Tobacco use: Secondary | ICD-10-CM | POA: Diagnosis not present

## 2018-12-15 DIAGNOSIS — D72829 Elevated white blood cell count, unspecified: Secondary | ICD-10-CM | POA: Diagnosis not present

## 2018-12-15 DIAGNOSIS — J9811 Atelectasis: Secondary | ICD-10-CM | POA: Diagnosis not present

## 2018-12-15 DIAGNOSIS — E1122 Type 2 diabetes mellitus with diabetic chronic kidney disease: Secondary | ICD-10-CM | POA: Diagnosis present

## 2018-12-15 DIAGNOSIS — Z981 Arthrodesis status: Secondary | ICD-10-CM | POA: Diagnosis not present

## 2018-12-15 DIAGNOSIS — N181 Chronic kidney disease, stage 1: Secondary | ICD-10-CM | POA: Diagnosis present

## 2018-12-15 DIAGNOSIS — F329 Major depressive disorder, single episode, unspecified: Secondary | ICD-10-CM | POA: Diagnosis present

## 2018-12-15 DIAGNOSIS — J810 Acute pulmonary edema: Secondary | ICD-10-CM | POA: Diagnosis not present

## 2018-12-15 DIAGNOSIS — I5030 Unspecified diastolic (congestive) heart failure: Secondary | ICD-10-CM | POA: Diagnosis not present

## 2018-12-15 DIAGNOSIS — D509 Iron deficiency anemia, unspecified: Secondary | ICD-10-CM | POA: Diagnosis not present

## 2018-12-15 DIAGNOSIS — J441 Chronic obstructive pulmonary disease with (acute) exacerbation: Secondary | ICD-10-CM | POA: Diagnosis not present

## 2018-12-15 DIAGNOSIS — I444 Left anterior fascicular block: Secondary | ICD-10-CM | POA: Diagnosis not present

## 2018-12-15 DIAGNOSIS — K219 Gastro-esophageal reflux disease without esophagitis: Secondary | ICD-10-CM | POA: Diagnosis present

## 2018-12-15 DIAGNOSIS — E1169 Type 2 diabetes mellitus with other specified complication: Secondary | ICD-10-CM | POA: Diagnosis not present

## 2018-12-15 DIAGNOSIS — J439 Emphysema, unspecified: Secondary | ICD-10-CM | POA: Diagnosis not present

## 2018-12-15 DIAGNOSIS — I491 Atrial premature depolarization: Secondary | ICD-10-CM | POA: Diagnosis not present

## 2018-12-15 DIAGNOSIS — R0602 Shortness of breath: Secondary | ICD-10-CM | POA: Diagnosis not present

## 2018-12-15 LAB — CBC WITH DIFFERENTIAL/PLATELET
Abs Immature Granulocytes: 0.03 10*3/uL (ref 0.00–0.07)
Basophils Absolute: 0 10*3/uL (ref 0.0–0.1)
Basophils Relative: 0 %
Eosinophils Absolute: 0.1 10*3/uL (ref 0.0–0.5)
Eosinophils Relative: 1 %
HCT: 43.9 % (ref 36.0–46.0)
Hemoglobin: 14.2 g/dL (ref 12.0–15.0)
Immature Granulocytes: 0 %
Lymphocytes Relative: 8 %
Lymphs Abs: 1 10*3/uL (ref 0.7–4.0)
MCH: 30.9 pg (ref 26.0–34.0)
MCHC: 32.3 g/dL (ref 30.0–36.0)
MCV: 95.6 fL (ref 80.0–100.0)
Monocytes Absolute: 0.1 10*3/uL (ref 0.1–1.0)
Monocytes Relative: 1 %
Neutro Abs: 12 10*3/uL — ABNORMAL HIGH (ref 1.7–7.7)
Neutrophils Relative %: 90 %
Platelets: 359 10*3/uL (ref 150–400)
RBC: 4.59 MIL/uL (ref 3.87–5.11)
RDW: 14.2 % (ref 11.5–15.5)
WBC: 13.4 10*3/uL — ABNORMAL HIGH (ref 4.0–10.5)
nRBC: 0 % (ref 0.0–0.2)

## 2018-12-15 LAB — APTT: aPTT: 34 seconds (ref 24–36)

## 2018-12-15 LAB — BASIC METABOLIC PANEL
Anion gap: 11 (ref 5–15)
BUN: 17 mg/dL (ref 8–23)
CO2: 24 mmol/L (ref 22–32)
Calcium: 8.9 mg/dL (ref 8.9–10.3)
Chloride: 101 mmol/L (ref 98–111)
Creatinine, Ser: 1.02 mg/dL — ABNORMAL HIGH (ref 0.44–1.00)
GFR calc Af Amer: >60 mL/min (ref >60)
GFR calc non Af Amer: 59 mL/min — ABNORMAL LOW (ref >60)
Glucose, Bld: 99 mg/dL (ref 70–99)
Potassium: 4.8 mmol/L (ref 3.5–5.1)
Sodium: 136 mmol/L (ref 135–145)

## 2018-12-15 LAB — POCT I-STAT 7, (LYTES, BLD GAS, ICA,H+H)
Acid-base deficit: 1 mmol/L (ref 0.0–2.0)
Acid-base deficit: 2 mmol/L (ref 0.0–2.0)
Bicarbonate: 24.5 mmol/L (ref 20.0–28.0)
Bicarbonate: 24.6 mmol/L (ref 20.0–28.0)
Calcium, Ion: 1.19 mmol/L (ref 1.15–1.40)
Calcium, Ion: 1.18 mmol/L (ref 1.15–1.40)
HCT: 44 % (ref 36.0–46.0)
HCT: 43 % (ref 36.0–46.0)
Hemoglobin: 15 g/dL (ref 12.0–15.0)
Hemoglobin: 14.6 g/dL (ref 12.0–15.0)
O2 Saturation: 64 %
O2 Saturation: 94 %
Patient temperature: 98.5
Patient temperature: 98.6
Potassium: 4.8 mmol/L (ref 3.5–5.1)
Potassium: 4.8 mmol/L (ref 3.5–5.1)
Sodium: 136 mmol/L (ref 135–145)
Sodium: 137 mmol/L (ref 135–145)
TCO2: 26 mmol/L (ref 22–32)
TCO2: 26 mmol/L (ref 22–32)
pCO2 arterial: 44.3 mmHg (ref 32.0–48.0)
pCO2 arterial: 47.5 mmHg (ref 32.0–48.0)
pH, Arterial: 7.35 (ref 7.350–7.450)
pH, Arterial: 7.322 — ABNORMAL LOW (ref 7.350–7.450)
pO2, Arterial: 35 mmHg — CL (ref 83.0–108.0)
pO2, Arterial: 76 mmHg — ABNORMAL LOW (ref 83.0–108.0)

## 2018-12-15 LAB — LACTATE DEHYDROGENASE: LDH: 144 U/L (ref 98–192)

## 2018-12-15 LAB — SARS CORONAVIRUS 2 BY RT PCR (HOSPITAL ORDER, PERFORMED IN ~~LOC~~ HOSPITAL LAB): SARS Coronavirus 2: NEGATIVE

## 2018-12-15 LAB — CBG MONITORING, ED: Glucose-Capillary: 155 mg/dL — ABNORMAL HIGH (ref 70–99)

## 2018-12-15 LAB — BRAIN NATRIURETIC PEPTIDE: B Natriuretic Peptide: 75.1 pg/mL (ref 0.0–100.0)

## 2018-12-15 LAB — LACTIC ACID, PLASMA: Lactic Acid, Venous: 1.1 mmol/L (ref 0.5–1.9)

## 2018-12-15 LAB — D-DIMER, QUANTITATIVE: D-Dimer, Quant: 0.4 ug/mL-FEU (ref 0.00–0.50)

## 2018-12-15 LAB — PROTIME-INR
INR: 1.1 (ref 0.8–1.2)
Prothrombin Time: 13.8 seconds (ref 11.4–15.2)

## 2018-12-15 LAB — FIBRINOGEN: Fibrinogen: 394 mg/dL (ref 210–475)

## 2018-12-15 LAB — MAGNESIUM: Magnesium: 1.2 mg/dL — ABNORMAL LOW (ref 1.7–2.4)

## 2018-12-15 MED ORDER — SODIUM CHLORIDE 0.9 % IV SOLN
500.0000 mg | INTRAVENOUS | Status: DC
  Administered 2018-12-15: 500 mg via INTRAVENOUS
  Filled 2018-12-15: qty 500

## 2018-12-15 MED ORDER — UMECLIDINIUM-VILANTEROL 62.5-25 MCG/INH IN AEPB
1.0000 | INHALATION_SPRAY | Freq: Every day | RESPIRATORY_TRACT | Status: DC
  Administered 2018-12-16 – 2018-12-18 (×2): 1 via RESPIRATORY_TRACT
  Filled 2018-12-15: qty 14

## 2018-12-15 MED ORDER — MOMETASONE FURO-FORMOTEROL FUM 200-5 MCG/ACT IN AERO
1.0000 | INHALATION_SPRAY | Freq: Two times a day (BID) | RESPIRATORY_TRACT | Status: DC
  Administered 2018-12-18: 1 via RESPIRATORY_TRACT
  Filled 2018-12-15 (×2): qty 8.8

## 2018-12-15 MED ORDER — SODIUM CHLORIDE 0.9 % IV SOLN
2.0000 g | INTRAVENOUS | Status: DC
  Administered 2018-12-15: 17:00:00 2 g via INTRAVENOUS
  Filled 2018-12-15: qty 20

## 2018-12-15 MED ORDER — INSULIN ASPART 100 UNIT/ML ~~LOC~~ SOLN
0.0000 [IU] | Freq: Three times a day (TID) | SUBCUTANEOUS | Status: DC
  Administered 2018-12-16 – 2018-12-17 (×2): 2 [IU] via SUBCUTANEOUS

## 2018-12-15 MED ORDER — BUDESONIDE 0.5 MG/2ML IN SUSP
2.0000 mg | Freq: Four times a day (QID) | RESPIRATORY_TRACT | Status: DC
  Filled 2018-12-15 (×2): qty 8

## 2018-12-15 MED ORDER — NICOTINE 21 MG/24HR TD PT24
21.0000 mg | MEDICATED_PATCH | Freq: Every day | TRANSDERMAL | Status: DC
  Administered 2018-12-16 – 2018-12-18 (×3): 21 mg via TRANSDERMAL
  Filled 2018-12-15 (×3): qty 1

## 2018-12-15 MED ORDER — INSULIN ASPART 100 UNIT/ML ~~LOC~~ SOLN: 0.0000 [IU] | Freq: Every day | SUBCUTANEOUS | Status: DC

## 2018-12-15 MED ORDER — ALBUTEROL SULFATE HFA 108 (90 BASE) MCG/ACT IN AERS
1.0000 | INHALATION_SPRAY | RESPIRATORY_TRACT | Status: DC | PRN
  Filled 2018-12-15: qty 6.7

## 2018-12-15 MED ORDER — FUROSEMIDE 10 MG/ML IJ SOLN
40.0000 mg | INTRAMUSCULAR | Status: AC
  Administered 2018-12-15: 40 mg via INTRAVENOUS
  Filled 2018-12-15: qty 4

## 2018-12-15 MED ORDER — PREDNISONE 20 MG PO TABS
40.0000 mg | ORAL_TABLET | Freq: Every day | ORAL | Status: DC
  Administered 2018-12-17 – 2018-12-18 (×2): 40 mg via ORAL
  Filled 2018-12-15 (×2): qty 2

## 2018-12-15 MED ORDER — ALBUTEROL SULFATE HFA 108 (90 BASE) MCG/ACT IN AERS
2.0000 | INHALATION_SPRAY | Freq: Once | RESPIRATORY_TRACT | Status: AC
  Administered 2018-12-15: 2 via RESPIRATORY_TRACT
  Filled 2018-12-15: qty 6.7

## 2018-12-15 MED ORDER — METHYLPREDNISOLONE SODIUM SUCC 125 MG IJ SOLR
60.0000 mg | Freq: Two times a day (BID) | INTRAMUSCULAR | Status: AC
  Administered 2018-12-15 – 2018-12-16 (×2): 60 mg via INTRAVENOUS
  Filled 2018-12-15 (×2): qty 2

## 2018-12-15 MED ORDER — SODIUM CHLORIDE 0.9% FLUSH: 3.0000 mL | Freq: Two times a day (BID) | INTRAVENOUS | Status: DC

## 2018-12-15 MED ORDER — SODIUM CHLORIDE 0.9 % IV SOLN
500.0000 mg | INTRAVENOUS | Status: AC
  Administered 2018-12-16: 01:00:00 500 mg via INTRAVENOUS
  Filled 2018-12-15: qty 500

## 2018-12-15 MED ORDER — AZITHROMYCIN 250 MG PO TABS
500.0000 mg | ORAL_TABLET | Freq: Every day | ORAL | Status: DC
  Administered 2018-12-16 – 2018-12-18 (×3): 500 mg via ORAL
  Filled 2018-12-15 (×3): qty 2

## 2018-12-15 MED ORDER — IOHEXOL 350 MG/ML SOLN
75.0000 mL | Freq: Once | INTRAVENOUS | Status: AC | PRN
  Administered 2018-12-15: 75 mL via INTRAVENOUS

## 2018-12-15 NOTE — ED Provider Notes (Signed)
[place] EMERGENCY DEPARTMENT Provider Note   CSN: [id number] Arrival date & time: 12/15/18  1434     History   Chief Complaint Chief Complaint  Patient presents with  . hypoxia  . Cough    HPI Debbie Mullins is a 63 y.o. female with past medical history of COPD, CHF, diabetes, GERD, iron deficiency anemia, presenting to the emergency department from PCP office with hypoxia.  Patient states she has been feeling overall well and was there for a routine wellness check when she was noted to be hypoxic.  EMS noted O2 saturation in the 70s and 80s though improved to 90s on 3 L nasal cannula.  Patient does endorse a dry cough that began on Saturday which she has been Taking over-the-counter cough medications for her sx. denies worsening leg swelling from her baseline.  She denies fever, shortness of breath, orthopnea, chest pain, congestion, or other complaints.  She is not chronically on oxygen at home.     The history is provided by the patient and the EMS personnel.    Past Medical History:  Diagnosis Date  . Anemia   . Asthma   . CHF (congestive heart failure) (HCC)    takes Furosemide daily   . COPD (chronic obstructive pulmonary disease) (HCC)    Albuterol inhaler prn;SIngulair at night  . Depression    takes Wellbutrin daily  . Diabetes mellitus    takes Metformin daily  . GERD (gastroesophageal reflux disease)    takes Nexium daily  . History of colon polyps    benign  . Hypertension    takes Lisinopril daily  . Iron deficiency anemia 08/01/2017  . Joint pain   . Joint swelling   . Leukocytosis 02/09/2011  . Neck pain    HNP  . Normocytic anemia 02/09/2011  . Peripheral edema    takes Furosemide daily  . Pneumonia    many yrs ago  . Pulmonary nodules/lesions, multiple 08/09/2013  . Shortness of breath dyspnea    with exertion  . Thrombocytosis ([place]) 02/09/2011  . Urinary frequency   . Urinary urgency   . Weakness    numbness and  tingling in both hands    Patient Active Problem List   Diagnosis Date Noted  . Iron deficiency anemia 08/01/2017  . Displaced supracondylar fracture without intracondylar extension of lower end of right femur, initial encounter for closed fracture ([place]) 05/23/2016  . 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 Closed fracture of right distal femur ([place])   . Osteoarthritis of spine with myelopathy, cervical region 11/17/2014  . 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 Pulmonary nodules/lesions, multiple 08/09/2013  . HLD (hyperlipidemia) 01/22/2012  . Pulmonary hypertension ([place]) 01/08/2012  . Acute on chronic diastolic heart failure ([place]) 01/05/2012  . Pulmonary edema, acute ([place]) 01/03/2012  . 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 HTN (hypertension) 01/02/2012  . COPD with acute exacerbation ([place]) 01/02/2012  . Tobacco abuse 07/04/2011  . 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 Leukocytosis 02/09/2011  . Mild Thrombocytosis 02/09/2011  . Obesity 02/09/2011    Past Surgical History:  Procedure Laterality Date  . ANTERIOR CERVICAL DECOMP/DISCECTOMY FUSION N/A 11/17/2014   Procedure: Cervical four- cervical five Anterior Cervical Decompression with fusion and bonegraft;  Surgeon: Ashok Pall, MD;  Location: [place] NEURO ORS;  Service: Neurosurgery;  Laterality: N/A;  C45 anterior cervical decompression with fusion plating and bonegraft  . BIOPSY  02/26/2018   Procedure: BIOPSY;  Surgeon: Danie Binder, MD;  [facility];  Service: Endoscopy;;  (colon) Gastric  duodenal  . COLONOSCOPY    . COLONOSCOPY N/A 02/26/2018   Procedure: COLONOSCOPY;  Surgeon: Danie Binder, MD;  Location: [facility] END[facility];  Service: Endoscopy;  Laterality: N/A;  9:30am  . ESOPHAGOGASTRODUODENOSCOPY N/A 02/26/2018   Procedure: ESOPHAGOGASTRODUODENOSCOPY (EGD);  Surgeon: Danie Binder, MD;  Location: [facility] END[facility];  Service: Endoscopy;  Laterality:  N/A;  . FEMUR IM NAIL Right 05/23/2016  . FEMUR IM NAIL Right 05/23/2016   Procedure: INTRAMEDULLARY (IM) RETROGRADE FEMORAL NAILING;  Surgeon: [name], MD;  Location: [facility];  Service: Orthopedics;  Laterality: Right;  . KNEE ARTHROPLASTY Left 08/10/2013   Procedure:  TOTAL KNEE ARTHROPLASTY;  Surgeon: [name], MD;  Location: [place];  Service: Orthopedics;  Laterality: Left;  Left Total Knee Arthroplasty Revision, Cemented, Semi-constrained,   . LEFT AND RIGHT HEART CATHETERIZATION WITH CORONARY ANGIOGRAM N/A 01/07/2012   Procedure: LEFT AND RIGHT HEART CATHETERIZATION WITH CORONARY ANGIOGRAM;  Surgeon: Thayer Headings, MD;  Location: [facility] CATH LAB;  Service: Cardiovascular;  Laterality: N/A;  . TONSILLECTOMY     as a child  . TOTAL KNEE ARTHROPLASTY Bilateral      OB History   No obstetric history on file.      Home Medications    Prior to Admission medications   Medication Sig Start Date End Date Taking? Authorizing Provider  amLODipine (NORVASC) 5 MG tablet Take 5 mg by mouth at bedtime.     [provider]  aspirin EC 81 MG tablet Take 81 mg by mouth daily.    [provider]  buPROPion (WELLBUTRIN XL) 150 MG 24 hr tablet Take 150 mg by mouth daily.    [provider]  celecoxib (CELEBREX) 200 MG capsule Take 200 mg by mouth daily.  10/01/16   [provider]  Clobetasol Prop Emollient Base 0.05 % emollient cream  08/22/18   [provider]  esomeprazole (NEXIUM) 40 MG capsule Take 40 mg by mouth daily before breakfast.     [provider]  furosemide (LASIX) 20 MG tablet Take 1 tablet (20 mg total) by mouth every other day. Patient taking differently: Take 20 mg by mouth daily.  04/01/13   Fay Records, MD  metFORMIN (GLUCOPHAGE) 500 MG tablet Take 500 mg by mouth 3 (three) times daily.    [provider]  montelukast (SINGULAIR) 10 MG tablet Take 10 mg by mouth at bedtime.    [provider]  ZORVOLEX 35  MG CAPS Take 35 mg by mouth 3 (three) times daily.    [provider]    Family History Family History  Problem Relation Age of Onset  . Heart failure Mother   . Cancer Maternal Grandmother   . Colon cancer Neg Hx   . Colon polyps Neg Hx     Social History Social History   Tobacco Use  . Smoking status: Current Every Day Smoker    Packs/day: 0.50    Years: 41.00    Pack years: 20.50    Types: Cigarettes  . Smokeless tobacco: Never Used  Substance Use Topics  . Alcohol use: Yes    Comment: wine  . Drug use: No     Allergies   Patient has no known allergies.   Review of Systems Review of Systems  Constitutional: Negative for fever.  HENT: Negative for congestion.  Respiratory: Positive for cough. Negative for shortness of breath.   Cardiovascular: Negative for chest pain and leg swelling (chronic).  Gastrointestinal: Negative for abdominal pain.  All other systems reviewed and are negative.    Physical Exam Updated Vital Signs BP (!) 164/108 (BP Location: Right Arm)   Pulse 90   Resp 18   LMP 07/03/2011   SpO2 96%   Physical Exam Vitals signs and nursing note reviewed.  Constitutional:      General: She is not in acute distress.    Appearance: She is well-developed. She is not ill-appearing.  HENT:     Head: Normocephalic and atraumatic.  Eyes:     Conjunctiva/sclera: Conjunctivae normal.  Cardiovascular:     Rate and Rhythm: Normal rate and regular rhythm.     Comments: 2+ pretibial edema b/l Pulmonary:     Effort: Pulmonary effort is normal. No respiratory distress.     Comments: Speaking in full sentences. O2 sat 91% on 3L.  Rales and ronchi present b/l R>L with faint wheezes. Abdominal:     General: Bowel sounds are normal.     Palpations: Abdomen is soft.     Tenderness: There is no abdominal tenderness.  Skin:    General: Skin is warm.  Neurological:     Mental Status: She is alert.  Psychiatric:        Behavior: Behavior  normal.      ED Treatments / Results  Labs (all labs ordered are listed, but only abnormal results are displayed) Labs Reviewed  CBC WITH DIFFERENTIAL/PLATELET  BASIC METABOLIC PANEL  BRAIN NATRIURETIC PEPTIDE    EKG None  Radiology No results found.  Procedures Procedures (including critical care time)  Medications Ordered in ED Medications  albuterol (VENTOLIN HFA) 108 (90 Base) MCG/ACT inhaler 2 puff (has no administration in time range)     Initial Impression / Assessment and Plan / ED Course  I have reviewed the triage vital signs and the nursing notes.  Pertinent labs & imaging results that were available during my care of the patient were reviewed by me and considered in my medical decision making (see chart for details).       Patient with history of CHF, COPD, presenting from PCP with hypoxia.  Patient is overall feeling well though does complain of a dry cough that began on Saturday.  She is not chronically require oxygen at home though is requiring 3 L here in the ED.  She was noted to be hypoxic in the 70s and low 80s at PCP and is now oxygenating at 91% on 3 L with normal work of breathing.  Abnormal lung sounds right worse than left.  We will proceed with labs and chest x-ray, albuterol inhaler for mild wheezing on exam.  Differential diagnosis includes pneumonia versus COPD exacerbation versus viral URI.  Care assumed at shift change by Dr. Sabra Heck to follow labs, chest x-ray, and determine appropriate disposition.  Final Clinical Impressions(s) / ED Diagnoses   Final diagnoses:  None    ED Discharge Orders    None       Brezlyn Manrique, [place] N, PA-C 12/15/18 1510    Lajean Saver, MD 12/15/18 1531

## 2018-12-15 NOTE — Progress Notes (Signed)
Notified bedside nurse of need to administer antibiotics.  

## 2018-12-15 NOTE — H&P (Signed)
Debbie Mullins [id number] DOB: Jul 10, 1955 DOA: 12/15/2018     PCP: Lucianne Lei, MD   Outpatient Specialists:      Oncology  [place]    Patient arrived to ER on 12/15/18 at 1434  Patient coming from: home Lives  With family    Chief Complaint:   Chief Complaint  Patient presents with  . hypoxia  . Cough    HPI: Debbie Mullins is a 63 y.o. female with medical history significant of iron deficiency anemia   Lung nodules, tobacco abuse, CHF, COPD, depression, diabetes, GERD  Presented with mild nonproductive cough was seen by her primary care provider who noted that her oxygen saturation running in the 70-80s% she was treated with albuterol. Denies any fevers or chills No associated chest pain She herself is unsure if she is actually having shortness of breath but visibly on exam was noted to appear to be short of breath Denies any exposure to individuals with COVID denies any fevers at home or anybody who lives states next to her.  Everybody been doing well.  Of note in August 2020 oxygen saturation was 93% on room air    Infectious risk factors:  Reports shortness of breath, dry cough,   In  ER RAPID COVID TEST NEGATIVE   Lab Results  Component Value Date   [place] NEGATIVE 12/15/2018    Regarding pertinent Chronic problems:       HTN on Norvasc   CHF diastolic  - last echo 0000000 unable to review records     CAD based on CT scan findings- On Aspirin  Last cardiac catheterization 2013  DM 2 -  Lab Results  Component Value Date   HGBA1C 6.2 01/19/2017   PO meds only,        COPD - not  followed by pulmonology continues to smoke     CKD stage III - baseline Cr 1.2 Lab Results  Component Value Date   CREATININE 1.02 (H) 12/15/2018   CREATININE 0.94 10/24/2018   CREATININE 1.23 (H) 02/19/2018       While in ER: Noted to be 91% room air while resting with some expiratory wheezes chest x-ray showing atelectasis CT scan showing  diffuse groundglass changes throughout the lungs and evidence of COPD He was obtained confirming hypoxia patient required up to 4 L of nasal cannula The following Work up has been ordered so far:  Orders Placed This Encounter  Procedures  . Critical Care  . Blood Culture (routine x 2)  . Urine culture  . SARS Coronavirus 2 [facility] order, Performed in [facility] hospital lab) Nasopharyngeal Nasopharyngeal Swab  . DG Chest Port 1 View  . CT Angio Chest PE W and/or Wo Contrast  . CBC with Differential  . Basic metabolic panel  . Brain natriuretic peptide  . APTT  . Protime-INR  . Urinalysis, Routine w reflex microscopic  . D-dimer, quantitative  . Procalcitonin  . Lactate dehydrogenase  . Ferritin  . Triglycerides  . Fibrinogen  . C-reactive protein  . Diet NPO time specified  . Check temperature  . Cardiac monitoring  . Refer to Sidebar Report: Sepsis Sidebar ED/IP  . Document vital signs within 1-hour of fluid bolus completion and notify provider of bolus completion  . Document Actual / Estimated Weight  . Insert peripheral IV x 2  . Initiate Carrier Fluid Protocol  . Place surgical mask on patient  . Patient to wear surgical mask during transportation  .  Assess patient for ability to self-prone. If able (can move self in bed, ambulate) and stable (SpO2 and oxygen requirement):  . RN/NT - Document specific oxygen requirements in CHL  . Notify EDP if new oxygen requirements escalates > 4L per minute [place]  . RN to draw the following extra tubes:  Marland Kitchen Initiate Code Sepsis ([facility] [phone]) Reason for Consult? tracking  . pharmacy consult  . Consult to hospitalist  ALL PATIENTS BEING ADMITTED/HAVING PROCEDURES NEED COVID-19 SCREENING  . Airborne and Contact precautions  . Pulse oximetry, continuous  . I-STAT 7, (LYTES, BLD GAS, ICA, H+H)  . ED EKG 12-Lead     Following Medications were ordered in ER: Medications  cefTRIAXone (ROCEPHIN) 2 g in sodium chloride 0.9 %  100 mL IVPB (0 g Intravenous Stopped 12/15/18 1725)  azithromycin (ZITHROMAX) 500 mg in sodium chloride 0.9 % 250 mL IVPB (0 mg Intravenous Stopped 12/15/18 1841)  furosemide (LASIX) injection 40 mg (has no administration in time range)  albuterol (VENTOLIN HFA) 108 (90 Base) MCG/ACT inhaler 2 puff (2 puffs Inhalation Given 12/15/18 1613)  iohexol (OMNIPAQUE) 350 MG/ML injection 75 mL (75 mLs Intravenous Contrast Given 12/15/18 2023)        Consult Orders  (From admission, onward)         Start     Ordered   12/15/18 2120  Consult to hospitalist  ALL PATIENTS BEING ADMITTED/HAVING PROCEDURES NEED COVID-19 SCREENING  Once    Comments: ALL PATIENTS BEING ADMITTED/HAVING PROCEDURES NEED COVID-19 SCREENING  Provider:  (Not yet assigned)  Question Answer Comment  Place call to: [facility]   Reason for Consult Admit      12/15/18 2119          Significant initial  Findings: Abnormal Labs Reviewed  CBC WITH DIFFERENTIAL/PLATELET - Abnormal; Notable for the following components:      Result Value   WBC 13.4 (*)    Neutro Abs 12.0 (*)    All other components within normal limits  BASIC METABOLIC PANEL - Abnormal; Notable for the following components:   Creatinine, Ser 1.02 (*)    GFR calc non Af Amer 59 (*)    All other components within normal limits  POCT I-STAT 7, (LYTES, BLD GAS, ICA,H+H) - Abnormal; Notable for the following components:   pO2, Arterial 35.0 (*)    All other components within normal limits    Otherwise labs showing:    Recent Labs  Lab 12/15/18 1453 12/15/18 1705  NA 136 136  K 4.8 4.8  CO2 24  --   GLUCOSE 99  --   BUN 17  --   CREATININE 1.02*  --   CALCIUM 8.9  --     Cr   stable,    Lab Results  Component Value Date   CREATININE 1.02 (H) 12/15/2018   CREATININE 0.94 10/24/2018   CREATININE 1.23 (H) 02/19/2018    No results for input(s): AST, ALT, ALKPHOS, BILITOT, PROT, ALBUMIN in the last 168 hours. Lab Results  Component Value  Date   CALCIUM 8.9 12/15/2018   PHOS 4.5 05/24/2016      WBC      Component Value Date/Time   WBC 13.4 (H) 12/15/2018 1453   ANC    Component Value Date/Time   NEUTROABS 12.0 (H) 12/15/2018 1453   ALC 1.0   Plt: Lab Results  Component Value Date   PLT 359 12/15/2018    Lactic Acid, Venous    Component Value Date/Time  LATICACIDVEN 1.1 12/15/2018 1615    Procalcitonin   Ordered   COVID-19 Labs  No results for input(s): DDIMER, FERRITIN, LDH, CRP in the last 72 hours.  Lab Results  Component Value Date   [place] NEGATIVE 12/15/2018     Arterial   Blood Gas result:    ABG    Component Value Date/Time   PHART 7.350 12/15/2018 1705   PCO2ART 44.3 12/15/2018 1705   PO2ART 35.0 (LL) 12/15/2018 1705   HCO3 24.5 12/15/2018 1705   TCO2 26 12/15/2018 1705   ACIDBASEDEF 1.0 12/15/2018 1705   O2SAT 64.0 12/15/2018 1705      HG/HCT   stable,      Component Value Date/Time   HGB 15.0 12/15/2018 1705   HCT 44.0 12/15/2018 1705     Troponin  ordered     ECG: Ordered   BNP (last 3 results) Recent Labs    12/15/18 1453  BNP 75.1     UA not ordered        CXR -  Atelectasis, COPD    CTA chest -    no PE, ground glass opassity     ED Triage Vitals  Enc Vitals Group     BP 12/15/18 1434 (!) 164/108     Pulse Rate 12/15/18 1434 90     Resp 12/15/18 1434 18     Temp 12/15/18 1503 98.5 F (36.9 C)     Temp Source 12/15/18 1503 Oral     SpO2 12/15/18 1434 96 %     Weight 12/15/18 1514 173 lb (78.5 kg)     Height 12/15/18 1514 5\' 2"  (1.575 m)     Head Circumference --      Peak Flow --      Pain Score 12/15/18 1512 0     Pain Loc --      Pain Edu? --      Excl. in [place]? --   TMAX(24)@       Latest  Blood pressure 135/80, pulse 88, temperature 98.5 F (36.9 C), temperature source Oral, resp. rate 18, height 5\' 2"  (1.575 m), weight 78.5 kg, last menstrual period 07/03/2011, SpO2 93 %.     Hospitalist was called for admission for  hypoxia   Review of Systems:    Pertinent positives include: mild shortnes of breath  dyspnea on exertion, Constitutional:  No weight loss, night sweats, Fevers, chills, fatigue, weight loss  HEENT:  No headaches, Difficulty swallowing,Tooth/dental problems,Sore throat,  No sneezing, itching, ear ache, nasal congestion, post nasal drip,  Cardio-vascular:  No chest pain, Orthopnea, PND, anasarca, dizziness, palpitations.no Bilateral lower extremity swelling  GI:  No heartburn, indigestion, abdominal pain, nausea, vomiting, diarrhea, change in bowel habits, loss of appetite, melena, blood in stool, hematemesis Resp:    No excess mucus, no productive cough, No non-productive cough, No coughing up of blood.No change in color of mucus.No wheezing. Skin:  no rash or lesions. No jaundice GU:  no dysuria, change in color of urine, no urgency or frequency. No straining to urinate.  No flank pain.  Musculoskeletal:  No joint pain or no joint swelling. No decreased range of motion. No back pain.  Psych:  No change in mood or affect. No depression or anxiety. No memory loss.  Neuro: no localizing neurological complaints, no tingling, no weakness, no double vision, no gait abnormality, no slurred speech, no confusion  All systems reviewed and apart from [place] all are negative  Past Medical History:  Past Medical History:  Diagnosis Date  . Anemia   . Asthma   . CHF (congestive heart failure) (HCC)    takes Furosemide daily   . COPD (chronic obstructive pulmonary disease) (HCC)    Albuterol inhaler prn;SIngulair at night  . Depression    takes Wellbutrin daily  . Diabetes mellitus    takes Metformin daily  . GERD (gastroesophageal reflux disease)    takes Nexium daily  . History of colon polyps    benign  . Hypertension    takes Lisinopril daily  . Iron deficiency anemia 08/01/2017  . Joint pain   . Joint swelling   . Leukocytosis 02/09/2011  . Neck pain    HNP  . Normocytic  anemia 02/09/2011  . Peripheral edema    takes Furosemide daily  . Pneumonia    many yrs ago  . Pulmonary nodules/lesions, multiple 08/09/2013  . Shortness of breath dyspnea    with exertion  . Thrombocytosis ([place]) 02/09/2011  . Urinary frequency   . Urinary urgency   . Weakness    numbness and tingling in both hands     Past Surgical History:  Procedure Laterality Date  . ANTERIOR CERVICAL DECOMP/DISCECTOMY FUSION N/A 11/17/2014   Procedure: Cervical four- cervical five Anterior Cervical Decompression with fusion and bonegraft;  Surgeon: Ashok Pall, MD;  Location: [facility]URO ORS;  Service: Neurosurgery;  Laterality: N/A;  C45 anterior cervical decompression with fusion plating and bonegraft  . BIOPSY  02/26/2018   Procedure: BIOPSY;  Surgeon: Danie Binder, MD;  [facility];  Service: Endoscopy;;  (colon) Gastric  duodenal  . COLONOSCOPY    . COLONOSCOPY N/A 02/26/2018   Procedure: COLONOSCOPY;  Surgeon: Danie Binder, MD;  [facility];  Service: Endoscopy;  Laterality: N/A;  9:30am  . ESOPHAGOGASTRODUODENOSCOPY N/A 02/26/2018   Procedure: ESOPHAGOGASTRODUODENOSCOPY (EGD);  Surgeon: Danie Binder, MD;  Location[facility];  Service: Endoscopy;  Laterality: N/A;  . FEMUR IM NAIL Right 05/23/2016  . FEMUR IM NAIL Right 05/23/2016   Procedure: INTRAMEDULLARY (IM) RETROGRADE FEMORAL NAILING;  Surgeon: [name], MD;  Location: [facility];  Service: Orthopedics;  Laterality: Right;  . KNEE ARTHROPLASTY Left 08/10/2013   Procedure:  TOTAL KNEE ARTHROPLASTY;  Surgeon: [name], MD;  Location: [facility];  Service: Orthopedics;  Laterality: Left;  Left Total Knee Arthroplasty Revision, Cemented, Semi-constrained,   . LEFT AND RIGHT HEART CATHETERIZATION WITH CORONARY ANGIOGRAM N/A 01/07/2012   Procedure: LEFT AND RIGHT HEART CATHETERIZATION WITH CORONARY ANGIOGRAM;  Surgeon: Thayer Headings, MD;  Location: [facility] CATH LAB;  Service: Cardiovascular;  Laterality: N/A;   . TONSILLECTOMY     as a child  . TOTAL KNEE ARTHROPLASTY Bilateral     Social History:  Ambulatory   independently       reports that she has been smoking cigarettes. She has a 20.50 pack-year smoking history. She has never used smokeless tobacco. She reports current alcohol use. She reports that she does not use drugs.     Family History:   Family History  Problem Relation Age of Onset  . Heart failure Mother   . Cancer Maternal Grandmother   . Colon cancer Neg Hx   . Colon polyps Neg Hx     Allergies: No Known Allergies   Prior to Admission medications   Medication Sig Start Date End Date Taking? Authorizing Provider  amLODipine (NORVASC) 5 MG tablet Take 5 mg by mouth every morning.  Yes [provider]  aspirin EC 325 MG tablet Take 325 mg by mouth every morning.    Yes [provider]  buPROPion (WELLBUTRIN XL) 150 MG 24 hr tablet Take 150 mg by mouth every morning.    Yes [provider]  celecoxib (CELEBREX) 200 MG capsule Take 200 mg by mouth every morning.  10/01/16  Yes [provider]  Clobetasol Prop Emollient Base 0.05 % emollient cream Apply 1 application topically 2 (two) times daily as needed (itching).  08/22/18  Yes [provider]  esomeprazole (NEXIUM) 40 MG capsule Take 40 mg by mouth daily before breakfast.    Yes [provider]  furosemide (LASIX) 20 MG tablet Take 1 tablet (20 mg total) by mouth every other day. 04/01/13  Yes Fay Records, MD  metFORMIN (GLUCOPHAGE) 500 MG tablet Take 500 mg by mouth 3 (three) times daily with meals.    Yes [provider]  montelukast (SINGULAIR) 10 MG tablet Take 10 mg by mouth at bedtime.   Yes [provider]   Physical Exam: Blood pressure 135/80, pulse 88, temperature 98.5 F (36.9 C), temperature source Oral, resp. rate 18, height 5\' 2"  (1.575 m), weight 78.5 kg, last menstrual period 07/03/2011, SpO2 93 %. 1. General:  in No   Acute  distress   Chronically ill  -appearing 2. Psychological: Alert and   Oriented 3. Head/ENT:    Dry Mucous Membranes                          Head Non traumatic, neck supple                            Poor Dentition 4. SKIN:  decreased Skin turgor,  Skin clean Dry and intact no rash 5. Heart: Regular rate and rhythm no Murmur, no Rub or gallop 6. Lungs:occasional  wheezes no crackles, distant    7. Abdomen: Soft,  non-tender, Non distended  bowel sounds present 8. Lower extremities: no clubbing, cyanosis, no edema 9. Neurologically Grossly intact, moving all 4 extremities equally   10. MSK: Normal range of motion   All other LABS:     Recent Labs  Lab 12/15/18 1453 12/15/18 1705  WBC 13.4*  --   NEUTROABS 12.0*  --   HGB 14.2 15.0  HCT 43.9 44.0  MCV 95.6  --   PLT 359  --      Recent Labs  Lab 12/15/18 1453 12/15/18 1705  NA 136 136  K 4.8 4.8  CL 101  --   CO2 24  --   GLUCOSE 99  --   BUN 17  --   CREATININE 1.02*  --   CALCIUM 8.9  --      No results for input(s): AST, ALT, ALKPHOS, BILITOT, PROT, ALBUMIN in the last 168 hours.     Cultures:    Component Value Date/Time   SDES URINE, RANDOM 01/03/2012 0008   SPECREQUEST CX ADDED AT 0029 01/03/2012 0008   CULT NO GROWTH 01/03/2012 0008   REPTSTATUS 01/04/2012 FINAL 01/03/2012 0008     Radiological Exams on Admission: Ct Angio Chest Pe W And/or Wo Contrast  Result Date: 12/15/2018 CLINICAL DATA:  Interstitial lung disease, shortness of breath EXAM: CT ANGIOGRAPHY CHEST WITH CONTRAST TECHNIQUE: Multidetector CT imaging of the chest was performed using the standard protocol during bolus administration of intravenous contrast. Multiplanar CT  image reconstructions and MIPs were obtained to evaluate the vascular anatomy. CONTRAST:  95mL OMNIPAQUE IOHEXOL 350 MG/ML SOLN COMPARISON:  October 24, 2018 FINDINGS: Cardiovascular: There is a optimal opacification of the pulmonary arteries. There is no central,segmental,  or subsegmental filling defects within the pulmonary arteries. There is mild cardiomegaly. No pericardial effusion thickening. No evidence right heart strain. There is normal three-vessel brachiocephalic anatomy without proximal stenosis. Scattered aortic atherosclerotic calcifications are seen without aneurysmal dilatation. There is coronary artery calcifications present. Mediastinum/Nodes: No hilar, mediastinal, or axillary adenopathy. Thyroid gland, trachea, and esophagus demonstrate no significant findings. Lungs/Pleura: There is areas of air trapping in seen within both lung apices, consistent with emphysematous changes. There is hazy ground-glass opacities seen throughout both lungs. Streaky areas of consolidation seen at both lung bases. Again noted is a tiny 5 mm pulmonary nodule seen within the right upper lung. No pleural effusion is seen. Upper Abdomen: No acute abnormalities present in the visualized portions of the upper abdomen. Musculoskeletal: No chest wall abnormality. No acute or significant osseous findings. Review of the MIP images confirms the above findings. IMPRESSION: 1. No central, segmental, or subsegmental pulmonary embolism. 2. Hazy ground-glass opacities seen throughout both lungs. This could be due to atypical viral infectious etiology, pulmonary edema, and/or inflammatory process. 3. Mild cardiomegaly 4. Findings of emphysematous changes. 5. Unchanged 5 mm pulmonary nodule in the right upper lung. 6. Aortic atherosclerosis 7. Coronary artery calcifications. Electronically Signed   By: Prudencio Pair M.D.   On: 12/15/2018 20:48   Dg Chest Port 1 View  Result Date: 12/15/2018 CLINICAL DATA:  Hypoxia. EXAM: PORTABLE CHEST 1 VIEW COMPARISON:  05/23/2016 chest x-ray and chest CT 10/24/2018 FINDINGS: The heart is within normal limits in size and stable. Stable slightly prominent pulmonary hila. Low lung volumes with vascular crowding and streaky atelectasis. Stable bronchitic type  interstitial lung changes and emphysematous changes. No definite infiltrates or effusions. The bony thorax is intact. Stable degenerative changes involving both shoulders. IMPRESSION: 1. Stable underlying emphysematous changes and bronchitic changes. 2. Low lung volumes with vascular crowding and streaky atelectasis. Electronically Signed   By: Marijo Sanes M.D.   On: 12/15/2018 15:51    Chart has been reviewed    Assessment/Plan  63 y.o. female with medical history significant of iron deficiency anemia , Lung nodules, tobacco abuse, CHF, COPD, depression, diabetes, GERD Admitted for acute respiratory failure with hypoxia most likely secondary to COPD exacerbation  Present on Admission: . COPD with acute exacerbation ([place]) -  -  - Will initiate: Steroid taper  -  Antibiotics azithromycin - Albuterol  PRN, - scheduled duoneb,  -  Breo or Dulera at discharge   -  Mucinex.  Titrate O2 to saturation >90%. Follow patients respiratory status.  ABG pO2 on 4L up to 76  Currently mentating well no evidence of symptomatic hypercarbia  . 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 HTN (hypertension) - chronic continue home medications  . Leukocytosis - chronic  . Tobacco abuse-  - Spoke about importance of quitting spent 5 minutes discussing options for treatment, prior attempts at quitting, and dangers of smoking  -At this point patient is    interested in quitting  - order nicotine patch   - nursing tobacco cessation protocol   . Acute respiratory failure with hypoxia (HCC) to be most  likely secondary to COPD exacerbation but given abnormal CT obtain respiratory panel. COVID testing initially negative but given very abnormal CT scan would repeat in a.m. Order respiratory panel Patient may require long-term oxygen at the discharge and or pulmonary consult if no significant improvement  Other plan as per  orders.  DVT prophylaxis:   Lovenox     Code Status:  FULL CODE as per patient   I had personally discussed CODE STATUS with patient    Family Communication:   Family not at  Bedside    Disposition Plan:    To home once workup is complete and patient is stable                  Would benefit from PT/OT eval prior to DC  Ordered                                        Consults called: none  Admission status:  ED Disposition    None       inpatient     Expect 2 midnight stay secondary to severity of patient's current illness including   hemodynamic instability despite optimal treatment ( hypoxia )  Severe lab/radiological/exam abnormalities including:   Groundglass opacities and extensive comorbidities including:  DM2   CHF   CAD  COPD/asthma    CKD   That are currently affecting medical management.   I expect  patient to be hospitalized for 2 midnights requiring inpatient medical care.  Patient is at high risk for adverse outcome (such as loss of life or disability) if not treated.  Indication for inpatient stay as follows:    Hemodynamic instability despite maximal medical therapy,    inability to maintain oral hydration      New or worsening hypoxia  Need for IV antibiotics,     Level of care     tele  For  24H       Precautions:  Airborne and Contact precautions  PPE: Used by the provider:   P100  eye Goggles,  Gloves      Usiel Astarita 12/15/2018, 11:19 PM     [facility]     after 2 AM please page floor coverage PA If 7AM-7PM, please contact the day team taking care of the patient using [email or web address]

## 2018-12-15 NOTE — ED Triage Notes (Signed)
Pt came from PCP from a well check up, her O2 was sating between 78-80% on RA. Upon arrival to ED pt has been given 3L O2 & was sating at 96%. Pt does not c/o any discomfort or pain, has Hx of COPD & CHF. NSR on monitor, afebrile, VSS.

## 2018-12-15 NOTE — ED Provider Notes (Signed)
Medical screening examination/treatment/procedure(s) were conducted as a shared visit with non-physician practitioner(s) and myself.  I personally evaluated the patient during the encounter.  Clinical Impression:   Final diagnoses:  Hypoxia  Acute pulmonary edema ([place])    This is a shared visit between myself and Ms. Martinique Robinson, physician assistant.  I personally saw this patient from the beginning of her visit through the end of the visit.  Please see the notes below regarding my patient encounter, treatment process and disposition.  I provided critical care to this patient.  Patient is a pleasant 63 year old female who has a history of congestive heart failure as well as COPD, she continues to smoke.  She presents with several days worth of a cough, states it is only occasional, she has had no fevers or chills and does not have any chest pain and does not feel short of breath.  Nevertheless she was sent here from the family doctor's office with a complaint of hypoxia.  Noted to be in the 70% range between 70 and 80%.  Supplemental oxygen brings her back to normal.  On my exam her oxygen is 91% on room air while in bed, she has some expiratory wheezing but no increased work of breathing accessory muscle use or distress.  She has some rales and rhonchi right greater than left.  Mild edema to the legs which she states is chronic and unchanged.  The patient is otherwise unremarkable.  X-ray, labs, the patient does not appear in distress.  I saw the patient in person and obtained history, examined the pateint and participated in guiding the work up.  I saw the patient with Martinique Robinson PA-C  This patient on room air has a PO2 of 35 and an oxygen saturation of 64%.  I placed the patient on 4 L by nasal cannula, her chest x-ray does not reveal obvious infiltrates though there is some streaky atelectasis.  She is afebrile but has a white blood cell count of over 13,000.  Denies history of prior  pulmonary embolism.  If COVID negative may need CT Angio of the chest.  CT scan does not reveal a definite answer as to the patient's hypoxia, there is still some concern for COVID.  Her test was negative however other labs have been ordered to corroborate this.  Discussed with the hospitalist will admit for persistent hypoxia.  The patient is requiring oxygen by nasal cannula at least 4 L, otherwise she drops below 90%  .Critical Care Performed by: Noemi Chapel, MD Authorized by: Noemi Chapel, MD   Critical care provider statement:    Critical care time (minutes):  35   Critical care was necessary to treat or prevent imminent or life-threatening deterioration of the following conditions:  Respiratory failure   Critical care was time spent personally by me on the following activities:  Discussions with consultants, evaluation of patient's response to treatment, examination of patient, ordering and performing treatments and interventions, ordering and review of laboratory studies, ordering and review of radiographic studies, pulse oximetry, re-evaluation of patient's condition, obtaining history from patient or surrogate and review of old charts     ED ECG REPORT  I personally interpreted this EKG   Date: 12/15/2018   Rate: 81  Rhythm: normal sinus rhythm  QRS Axis: left  Intervals: normal  ST/T Wave abnormalities: normal  Conduction Disutrbances:none  Narrative Interpretation:   Old EKG Reviewed: unchanged    Noemi Chapel, MD 12/15/18 2351

## 2018-12-15 NOTE — ED Notes (Signed)
Purewick put in place by RN Joanell Rising. And tech Benigno Check P.

## 2018-12-16 DIAGNOSIS — J441 Chronic obstructive pulmonary disease with (acute) exacerbation: Principal | ICD-10-CM

## 2018-12-16 DIAGNOSIS — J9601 Acute respiratory failure with hypoxia: Secondary | ICD-10-CM

## 2018-12-16 LAB — HIV ANTIBODY (ROUTINE TESTING W REFLEX): HIV Screen 4th Generation wRfx: NONREACTIVE

## 2018-12-16 LAB — CBC WITH DIFFERENTIAL/PLATELET
Abs Immature Granulocytes: 0.03 10*3/uL (ref 0.00–0.07)
Basophils Absolute: 0 10*3/uL (ref 0.0–0.1)
Basophils Relative: 0 %
Eosinophils Absolute: 0 10*3/uL (ref 0.0–0.5)
Eosinophils Relative: 0 %
HCT: 40.3 % (ref 36.0–46.0)
Hemoglobin: 13.1 g/dL (ref 12.0–15.0)
Immature Granulocytes: 1 %
Lymphocytes Relative: 15 %
Lymphs Abs: 0.8 10*3/uL (ref 0.7–4.0)
MCH: 30.8 pg (ref 26.0–34.0)
MCHC: 32.5 g/dL (ref 30.0–36.0)
MCV: 94.6 fL (ref 80.0–100.0)
Monocytes Absolute: 0 10*3/uL — ABNORMAL LOW (ref 0.1–1.0)
Monocytes Relative: 1 %
Neutro Abs: 4.6 10*3/uL (ref 1.7–7.7)
Neutrophils Relative %: 83 %
Platelets: 333 10*3/uL (ref 150–400)
RBC: 4.26 MIL/uL (ref 3.87–5.11)
RDW: 14.1 % (ref 11.5–15.5)
WBC: 5.5 10*3/uL (ref 4.0–10.5)
nRBC: 0 % (ref 0.0–0.2)

## 2018-12-16 LAB — CBG MONITORING, ED
Glucose-Capillary: 125 mg/dL — ABNORMAL HIGH (ref 70–99)
Glucose-Capillary: 108 mg/dL — ABNORMAL HIGH (ref 70–99)
Glucose-Capillary: 119 mg/dL — ABNORMAL HIGH (ref 70–99)

## 2018-12-16 LAB — PHOSPHORUS: Phosphorus: 4 mg/dL (ref 2.5–4.6)

## 2018-12-16 LAB — RESPIRATORY PANEL BY PCR

## 2018-12-16 LAB — URINE CULTURE

## 2018-12-16 LAB — TSH: TSH: 0.452 u[IU]/mL (ref 0.350–4.500)

## 2018-12-16 LAB — MAGNESIUM: Magnesium: 1.3 mg/dL — ABNORMAL LOW (ref 1.7–2.4)

## 2018-12-16 LAB — C-REACTIVE PROTEIN: CRP: <0.8 mg/dL (ref <1.0)

## 2018-12-16 LAB — COMPREHENSIVE METABOLIC PANEL
ALT: 13 U/L (ref 0–44)
AST: 13 U/L — ABNORMAL LOW (ref 15–41)
Albumin: 3.6 g/dL (ref 3.5–5.0)
Alkaline Phosphatase: 95 U/L (ref 38–126)
Anion gap: 12 (ref 5–15)
BUN: 19 mg/dL (ref 8–23)
CO2: 24 mmol/L (ref 22–32)
Calcium: 8.7 mg/dL — ABNORMAL LOW (ref 8.9–10.3)
Chloride: 99 mmol/L (ref 98–111)
Creatinine, Ser: 0.96 mg/dL (ref 0.44–1.00)
GFR calc Af Amer: >60 mL/min (ref >60)
GFR calc non Af Amer: >60 mL/min (ref >60)
Glucose, Bld: 147 mg/dL — ABNORMAL HIGH (ref 70–99)
Potassium: 4.7 mmol/L (ref 3.5–5.1)
Sodium: 135 mmol/L (ref 135–145)
Total Bilirubin: 0.8 mg/dL (ref 0.3–1.2)
Total Protein: 7.2 g/dL (ref 6.5–8.1)

## 2018-12-16 LAB — URINALYSIS, ROUTINE W REFLEX MICROSCOPIC
Bacteria, UA: NONE SEEN
Bilirubin Urine: NEGATIVE
Glucose, UA: NEGATIVE mg/dL
Ketones, ur: NEGATIVE mg/dL
Leukocytes,Ua: NEGATIVE
Nitrite: NEGATIVE
Protein, ur: NEGATIVE mg/dL
Specific Gravity, Urine: 1.009 (ref 1.005–1.030)
pH: 5 (ref 5.0–8.0)

## 2018-12-16 LAB — TROPONIN I (HIGH SENSITIVITY)
Troponin I (High Sensitivity): 7 ng/L (ref <18)
Troponin I (High Sensitivity): 8 ng/L (ref <18)

## 2018-12-16 LAB — PROCALCITONIN: Procalcitonin: <0.1 ng/mL

## 2018-12-16 LAB — TRIGLYCERIDES: Triglycerides: 44 mg/dL (ref <150)

## 2018-12-16 LAB — FERRITIN: Ferritin: 41 ng/mL (ref 11–307)

## 2018-12-16 LAB — GLUCOSE, CAPILLARY
Glucose-Capillary: 149 mg/dL — ABNORMAL HIGH (ref 70–99)
Glucose-Capillary: 109 mg/dL — ABNORMAL HIGH (ref 70–99)

## 2018-12-16 LAB — HEMOGLOBIN A1C
Hgb A1c MFr Bld: 5.7 % — ABNORMAL HIGH (ref 4.8–5.6)
Mean Plasma Glucose: 116.89 mg/dL

## 2018-12-16 LAB — SARS CORONAVIRUS 2 BY RT PCR (HOSPITAL ORDER, PERFORMED IN ~~LOC~~ HOSPITAL LAB): SARS Coronavirus 2: NEGATIVE

## 2018-12-16 MED ORDER — SODIUM CHLORIDE 0.9 % IV SOLN: 250.0000 mL | INTRAVENOUS | Status: DC | PRN

## 2018-12-16 MED ORDER — BUDESONIDE 0.5 MG/2ML IN SUSP
2.0000 mg | Freq: Two times a day (BID) | RESPIRATORY_TRACT | Status: DC
  Administered 2018-12-17 – 2018-12-18 (×3): 2 mg via RESPIRATORY_TRACT
  Filled 2018-12-16 (×4): qty 8

## 2018-12-16 MED ORDER — ASPIRIN EC 325 MG PO TBEC
325.0000 mg | DELAYED_RELEASE_TABLET | Freq: Every morning | ORAL | Status: DC
  Administered 2018-12-16 – 2018-12-18 (×3): 325 mg via ORAL
  Filled 2018-12-16 (×3): qty 1

## 2018-12-16 MED ORDER — FUROSEMIDE 20 MG PO TABS
20.0000 mg | ORAL_TABLET | ORAL | Status: DC
  Administered 2018-12-16 – 2018-12-18 (×2): 20 mg via ORAL
  Filled 2018-12-16 (×2): qty 1

## 2018-12-16 MED ORDER — ONDANSETRON HCL 4 MG PO TABS: 4.0000 mg | ORAL_TABLET | Freq: Four times a day (QID) | ORAL | Status: DC | PRN

## 2018-12-16 MED ORDER — ACETAMINOPHEN 325 MG PO TABS
650.0000 mg | ORAL_TABLET | Freq: Four times a day (QID) | ORAL | Status: DC | PRN
  Administered 2018-12-17 – 2018-12-18 (×2): 650 mg via ORAL
  Filled 2018-12-16 (×2): qty 2

## 2018-12-16 MED ORDER — PANTOPRAZOLE SODIUM 40 MG PO TBEC
40.0000 mg | DELAYED_RELEASE_TABLET | Freq: Every day | ORAL | Status: DC
  Administered 2018-12-16 – 2018-12-18 (×3): 40 mg via ORAL
  Filled 2018-12-16 (×3): qty 1

## 2018-12-16 MED ORDER — SODIUM CHLORIDE 0.9% FLUSH: 3.0000 mL | INTRAVENOUS | Status: DC | PRN

## 2018-12-16 MED ORDER — SODIUM CHLORIDE 0.9% FLUSH
3.0000 mL | Freq: Two times a day (BID) | INTRAVENOUS | Status: DC
  Administered 2018-12-16 – 2018-12-18 (×5): 3 mL via INTRAVENOUS

## 2018-12-16 MED ORDER — ONDANSETRON HCL 4 MG/2ML IJ SOLN: 4.0000 mg | Freq: Four times a day (QID) | INTRAMUSCULAR | Status: DC | PRN

## 2018-12-16 MED ORDER — ACETAMINOPHEN 650 MG RE SUPP: 650.0000 mg | Freq: Four times a day (QID) | RECTAL | Status: DC | PRN

## 2018-12-16 MED ORDER — BUPROPION HCL ER (XL) 150 MG PO TB24
150.0000 mg | ORAL_TABLET | Freq: Every morning | ORAL | Status: DC
  Administered 2018-12-16 – 2018-12-18 (×3): 150 mg via ORAL
  Filled 2018-12-16 (×3): qty 1

## 2018-12-16 MED ORDER — MAGNESIUM SULFATE 2 GM/50ML IV SOLN
2.0000 g | Freq: Once | INTRAVENOUS | Status: AC
  Administered 2018-12-16: 10:00:00 2 g via INTRAVENOUS
  Filled 2018-12-16: qty 50

## 2018-12-16 MED ORDER — AMLODIPINE BESYLATE 5 MG PO TABS
5.0000 mg | ORAL_TABLET | Freq: Every morning | ORAL | Status: DC
  Administered 2018-12-16 – 2018-12-18 (×3): 5 mg via ORAL
  Filled 2018-12-16 (×3): qty 1

## 2018-12-16 MED ORDER — HYDROCODONE-ACETAMINOPHEN 5-325 MG PO TABS: 1.0000 | ORAL_TABLET | ORAL | Status: DC | PRN

## 2018-12-16 MED ORDER — ENOXAPARIN SODIUM 40 MG/0.4ML ~~LOC~~ SOLN
40.0000 mg | SUBCUTANEOUS | Status: DC
  Administered 2018-12-16 – 2018-12-17 (×2): 40 mg via SUBCUTANEOUS
  Filled 2018-12-16 (×2): qty 0.4

## 2018-12-16 MED ORDER — MONTELUKAST SODIUM 10 MG PO TABS
10.0000 mg | ORAL_TABLET | Freq: Every day | ORAL | Status: DC
  Administered 2018-12-16 – 2018-12-17 (×2): 10 mg via ORAL
  Filled 2018-12-16 (×3): qty 1

## 2018-12-16 NOTE — ED Notes (Signed)
Tele ordered bfast 

## 2018-12-16 NOTE — ED Notes (Signed)
Dr. Cletus Gash at bedside

## 2018-12-16 NOTE — Progress Notes (Signed)
PROGRESS NOTE    Debbie Mullins  [id number] DOB: November 06, 1955 DOA: 12/15/2018 PCP: Lucianne Lei, MD   Brief Narrative:  Debbie Mullins is a 63 year old pleasant female with a past medical history of iron deficiency anemia, lung nodules, tobacco abuse, CHF, COPD, depression and type 2 diabetes mellitus and GERD who presented to ED on 12/15/2018 with a complaint of nonproductive cough.  Apparently, she went to see her PCP and her oxygen saturation was around 70 to 80% on room air.  She was treated with albuterol.  She did not have any fever, chills or chest pain but she did have some shortness of breath.  She denied any exposure to COVID-19 infected person.  She was sent to ED and upon arrival, she was hypoxic and she had some expiratory wheezes.  Chest x-ray showed some atelectasis and this was followed through with CT of the chest with angiogram and she was ruled out of PE but this showed hazy groundglass opacities throughout both lungs.  She was tested negative for COVID-19.  She was requiring 4 L of oxygen.  She was admitted under [facility] service for acute hypoxic respiratory failure secondary to COPD exacerbation.  She was started on steroids and azithromycin.  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  She feels much better.  She is down to only 2 L nasal oxygen.  Denies any fever.  No recent contact with COVID-19 and she was tested negative for COVID-19 here however due to having groundglass opacities on the CT scan, admitting physician had high suspicion so she was re-tested this morning and results are still pending.  We will continue airborne precautions in the meantime.   She does not seem to have pneumonia.  Her procalcitonin is unremarkable so we will discontinue Rocephin however I will continue azithromycin for anti-inflammatory effort and I will also continue prednisone 40 mg p.o. daily as well as PRN breathing treatments.  Type 2 diabetes mellitus: Controlled.  Continue SSI.  Essential hypertension:.  Continue home medications.  Tobacco abuse: Counseled regarding cessation.  She smokes 1 pack a day for several years.  Hypomagnesemia: 1.3 this morning.  Will replace with magnesium sulfate.  DVT prophylaxis: Lovenox Code Status: Full code Family Communication:  None present at bedside.  Plan of care discussed with patient in length and he verbalized understanding and agreed with it. Disposition Plan: Potential discharge tomorrow.  Consultants:   None  Procedures:   None  Antimicrobials:   IV azithromycin.  Discontinuing Rocephin today.   Subjective: Patient seen and examined.  She is still in the ER.  She states that she feels better.  She tells me that she only has shortness of breath but no cough, fever or any other complaint.  Objective: Vitals:   12/16/18 0915 12/16/18 0930 12/16/18 0945 12/16/18 1017  BP: (!) 137/96 (!) 144/71 (!) 131/103 (!) 141/65  Pulse: 77 64 66   Resp: 14 12 19    Temp:      TempSrc:      SpO2: 99% 100% 100%   Weight:      Height:       No intake or output data  in the 24 hours ending 12/16/18 1055 Filed Weights   12/15/18 1514  Weight: 78.5 kg    Examination:  General exam: Appears calm and comfortable  Respiratory system: Clear to auscultation. Respiratory effort normal. Cardiovascular system: S1 & S2 heard, RRR. No JVD, murmurs, rubs, gallops or clicks. No pedal edema. Gastrointestinal system: Abdomen is nondistended, soft and nontender. No organomegaly or masses felt. Normal bowel sounds heard. Central nervous system: Alert and oriented. No focal neurological deficits. Extremities: Symmetric 5 x  5 power. Skin: No rashes, lesions or ulcers Psychiatry: Judgement and insight appear normal. Mood & affect appropriate.    Data Reviewed: I have personally reviewed following labs and imaging studies  CBC: Recent Labs  Lab 12/15/18 1453 12/15/18 1705 12/15/18 2305 12/16/18 0225  WBC 13.4*  --   --  5.5  NEUTROABS 12.0*  --   --  4.6  HGB 14.2 15.0 14.6 13.1  HCT 43.9 44.0 43.0 40.3  MCV 95.6  --   --  94.6  PLT 359  --   --  0000000   Basic Metabolic Panel: Recent Labs  Lab 12/15/18 1453 12/15/18 1705 12/15/18 2121 12/15/18 2305 12/16/18 0225  NA 136 136  --  137 135  K 4.8 4.8  --  4.8 4.7  CL 101  --   --   --  99  CO2 24  --   --   --  24  GLUCOSE 99  --   --   --  147*  BUN 17  --   --   --  19  CREATININE 1.02*  --   --   --  0.96  CALCIUM 8.9  --   --   --  8.7*  MG  --   --  1.2*  --  1.3*  PHOS  --   --   --   --  4.0   GFR: Estimated Creatinine Clearance: 59.7 mL/min (by C-G formula based on SCr of 0.96 mg/dL). Liver Function Tests: Recent Labs  Lab 12/16/18 0225  AST 13*  ALT 13  ALKPHOS 95  BILITOT 0.8  PROT 7.2  ALBUMIN 3.6   No results for input(s): LIPASE, AMYLASE in the last 168 hours. No results for input(s): AMMONIA in the last 168 hours. Coagulation Profile: Recent Labs  Lab 12/15/18 1559  INR 1.1   Cardiac Enzymes: No results for input(s): CKTOTAL, CKMB, CKMBINDEX, TROPONINI in the last 168 hours. BNP (last 3 results) No results for input(s): PROBNP in the last 8760 hours. HbA1C: Recent Labs    12/16/18 0225  HGBA1C 5.7*   CBG: Recent Labs  Lab 12/15/18 2304 12/16/18 0731  GLUCAP 155* 125*   Lipid Profile: Recent Labs    12/15/18 2235  TRIG 44   Thyroid Function Tests: Recent Labs    12/16/18 0225  TSH 0.452   Anemia Panel: Recent Labs    12/15/18 2121  FERRITIN 41   Sepsis Labs: Recent Labs  Lab 12/15/18 1615 12/15/18 2121  PROCALCITON  --  <0.10  LATICACIDVEN 1.1  --     Recent Results (from the  past 240 hour(s))  SARS Coronavirus 2 [facility] order, Performed in [facility] hospital lab) Nasopharyngeal Nasopharyngeal Swab     Status: None   Collection Time: 12/15/18  4:35 PM   Specimen: Nasopharyngeal Swab  Result Value Ref Range Status   SARS Coronavirus 2 NEGATIVE NEGATIVE Final    Comment: (NOTE) If result is NEGATIVE SARS-CoV-2 target nucleic  acids are NOT DETECTED. The SARS-CoV-2 RNA is generally detectable in upper and lower  respiratory specimens during the acute phase of infection. The lowest  concentration of SARS-CoV-2 viral copies this assay can detect is 250  copies / mL. A negative result does not preclude SARS-CoV-2 infection  and should not be used as the sole basis for treatment or other  patient management decisions.  A negative result may occur with  improper specimen collection / handling, submission of specimen other  than nasopharyngeal swab, presence of viral mutation(s) within the  areas targeted by this assay, and inadequate number of viral copies  (<250 copies / mL). A negative result must be combined with clinical  observations, patient history, and epidemiological information. If result is POSITIVE SARS-CoV-2 target nucleic acids are DETECTED. The SARS-CoV-2 RNA is generally detectable in upper and lower  respiratory specimens dur ing the acute phase of infection.  Positive  results are indicative of active infection with SARS-CoV-2.  Clinical  correlation with patient history and other diagnostic information is  necessary to determine patient infection status.  Positive results do  not rule out bacterial infection or co-infection with other viruses. If result is PRESUMPTIVE POSTIVE SARS-CoV-2 nucleic acids MAY BE PRESENT.   A presumptive positive result was obtained on the submitted specimen  and confirmed on repeat testing.  While 2019 novel coronavirus  (SARS-CoV-2) nucleic acids may be present in the submitted sample  additional confirmatory  testing may be necessary for epidemiological  and / or clinical management purposes  to differentiate between  SARS-CoV-2 and other Sarbecovirus currently known to infect humans.  If clinically indicated additional testing with an alternate test  methodology [phone]) is advised. The SARS-CoV-2 RNA is generally  detectable in upper and lower respiratory sp ecimens during the acute  phase of infection. The expected result is Negative. Fact Sheet for Patients:  StrictlyIdeas.no Fact Sheet for Healthcare Providers: BankingDealers.co.za This test is not yet approved or cleared by the [place] FDA and has been authorized for detection and/or diagnosis of SARS-CoV-2 by FDA under an Emergency Use Authorization (EUA).  This EUA will remain in effect (meaning this test can be used) for the duration of the COVID-19 declaration under Section 564(b)(1) of the Act, 21 U.S.C. section 360bbb-3(b)(1), unless the authorization is terminated or revoked sooner. Performed at [facility], [place] [address] [place], [place] [address]   Respiratory Panel by PCR     Status: None   Collection Time: 12/16/18 12:53 AM   Specimen: Nasopharyngeal Swab; Respiratory  Result Value Ref Range Status   Adenovirus NOT DETECTED NOT DETECTED Final   Coronavirus 229E NOT DETECTED NOT DETECTED Final    Comment: (NOTE) The Coronavirus on the Respiratory Panel, DOES NOT test for the novel  Coronavirus (2019 nCoV)    Coronavirus HKU1 NOT DETECTED NOT DETECTED Final   Coronavirus NL63 NOT DETECTED NOT DETECTED Final   Coronavirus OC43 NOT DETECTED NOT DETECTED Final   Metapneumovirus NOT DETECTED NOT DETECTED Final   Rhinovirus / Enterovirus NOT DETECTED NOT DETECTED Final   Influenza A NOT DETECTED NOT DETECTED Final   Influenza B NOT DETECTED NOT DETECTED Final   Parainfluenza Virus 1 NOT DETECTED NOT DETECTED Final   Parainfluenza Virus 2 NOT DETECTED NOT  DETECTED Final   Parainfluenza Virus 3 NOT DETECTED NOT DETECTED Final   Parainfluenza Virus 4 NOT DETECTED NOT DETECTED Final   Respiratory Syncytial Virus NOT DETECTED NOT DETECTED Final   Bordetella pertussis  NOT DETECTED NOT DETECTED Final   Chlamydophila pneumoniae NOT DETECTED NOT DETECTED Final   Mycoplasma pneumoniae NOT DETECTED NOT DETECTED Final    Comment: Performed at [facility], [place] [address], [place], [place] [address]      Radiology Studies: Ct Angio Chest Pe W And/or Wo Contrast  Result Date: 12/15/2018 CLINICAL DATA:  Interstitial lung disease, shortness of breath EXAM: CT ANGIOGRAPHY CHEST WITH CONTRAST TECHNIQUE: Multidetector CT imaging of the chest was performed using the standard protocol during bolus administration of intravenous contrast. Multiplanar CT image reconstructions and MIPs were obtained to evaluate the vascular anatomy. CONTRAST:  12mL OMNIPAQUE IOHEXOL 350 MG/ML SOLN COMPARISON:  October 24, 2018 FINDINGS: Cardiovascular: There is a optimal opacification of the pulmonary arteries. There is no central,segmental, or subsegmental filling defects within the pulmonary arteries. There is mild cardiomegaly. No pericardial effusion thickening. No evidence right heart strain. There is normal three-vessel brachiocephalic anatomy without proximal stenosis. Scattered aortic atherosclerotic calcifications are seen without aneurysmal dilatation. There is coronary artery calcifications present. Mediastinum/Nodes: No hilar, mediastinal, or axillary adenopathy. Thyroid gland, trachea, and esophagus demonstrate no significant findings. Lungs/Pleura: There is areas of air trapping in seen within both lung apices, consistent with emphysematous changes. There is hazy ground-glass opacities seen throughout both lungs. Streaky areas of consolidation seen at both lung bases. Again noted is a tiny 5 mm pulmonary nodule seen within the right upper lung. No pleural effusion is seen.  Upper Abdomen: No acute abnormalities present in the visualized portions of the upper abdomen. Musculoskeletal: No chest wall abnormality. No acute or significant osseous findings. Review of the MIP images confirms the above findings. IMPRESSION: 1. No central, segmental, or subsegmental pulmonary embolism. 2. Hazy ground-glass opacities seen throughout both lungs. This could be due to atypical viral infectious etiology, pulmonary edema, and/or inflammatory process. 3. Mild cardiomegaly 4. Findings of emphysematous changes. 5. Unchanged 5 mm pulmonary nodule in the right upper lung. 6. Aortic atherosclerosis 7. Coronary artery calcifications. Electronically Signed   By: Prudencio Pair M.D.   On: 12/15/2018 20:48   Dg Chest Port 1 View  Result Date: 12/15/2018 CLINICAL DATA:  Hypoxia. EXAM: PORTABLE CHEST 1 VIEW COMPARISON:  05/23/2016 chest x-ray and chest CT 10/24/2018 FINDINGS: The heart is within normal limits in size and stable. Stable slightly prominent pulmonary hila. Low lung volumes with vascular crowding and streaky atelectasis. Stable bronchitic type interstitial lung changes and emphysematous changes. No definite infiltrates or effusions. The bony thorax is intact. Stable degenerative changes involving both shoulders. IMPRESSION: 1. Stable underlying emphysematous changes and bronchitic changes. 2. Low lung volumes with vascular crowding and streaky atelectasis. Electronically Signed   By: Marijo Sanes M.D.   On: 12/15/2018 15:51    Scheduled Meds: . amLODipine  5 mg Oral q morning - 10a  . aspirin EC  325 mg Oral q morning - 10a  . azithromycin  500 mg Oral Daily  . budesonide (PULMICORT) nebulizer solution  2 mg Nebulization Q6H  . buPROPion  150 mg Oral q morning - 10a  . enoxaparin (LOVENOX) injection  40 mg Subcutaneous Q24H  . furosemide  20 mg Oral QODAY  . insulin aspart  0-15 Units Subcutaneous TID WC  . insulin aspart  0-5 Units Subcutaneous QHS  . methylPREDNISolone  (SOLU-MEDROL) injection  60 mg Intravenous Q12H   Followed by  . [START ON 12/17/2018] predniSONE  40 mg Oral Q breakfast  . mometasone-formoterol  1 puff Inhalation BID  . montelukast  10 mg Oral QHS  . nicotine  21 mg Transdermal Daily  . pantoprazole  40 mg Oral Daily  . sodium chloride flush  3 mL Intravenous Q12H  . sodium chloride flush  3 mL Intravenous Q12H  . umeclidinium-vilanterol  1 puff Inhalation Daily   Continuous Infusions: . sodium chloride    . cefTRIAXone (ROCEPHIN)  IV Stopped (12/15/18 1725)  . magnesium sulfate bolus IVPB 2 g (12/16/18 1013)     LOS: 1 day   Time spent: 32 minutes   Darliss Cheney, MD [facility]  12/16/2018, 10:55 AM   To contact the attending provider between 7A-7P or the covering provider during after hours 7P-7A, please log into the web site [email or web address] and use password [email or web address].

## 2018-12-16 NOTE — ED Notes (Signed)
Pt requested something to drink and ok per RN Otila Kluver. Pt requested apple juice and given the same.

## 2018-12-16 NOTE — ED Notes (Signed)
ED TO INPATIENT HANDOFF REPORT  ED Nurse Name and Phone #: [facility] [phone]  S Name/Age/Gender Debbie Mullins 63 y.o. female Room/Bed: 039C/039C  Code Status   Code Status: Full Code  Home/SNF/Other Home Patient oriented to: self, place, time and situation Is this baseline? Yes   Triage Complete: Triage complete  Chief Complaint low 02 sats  Triage Note Pt came from PCP from a well check up, her O2 was sating between 78-80% on RA. Upon arrival to ED pt has been given 3L O2 & was sating at 96%. Pt does not c/o any discomfort or pain, has Hx of COPD & CHF. NSR on monitor, afebrile, VSS.     Allergies No Known Allergies  Level of Care/Admitting Diagnosis ED Disposition    ED Disposition Condition [facility] Area: [place] [[id number]]  Level of Care: Telemetry Medical [104]  Covid Evaluation: Person Under Investigation (PUI)  Diagnosis: Acute respiratory failure ([place]) [518.81.ICD-9-CM]  Admitting Physician: Toy Baker [[id number]]  Attending Physician: Toy Baker [[id number]]  Estimated length of stay: 3 - 4 days  Certification:: I certify this patient will need inpatient services for at least 2 midnights  PT Class (Do Not Modify): Inpatient [101]  PT Acc Code (Do Not Modify): Private [1]       B Medical/Surgery History Past Medical History:  Diagnosis Date  . Anemia   . Asthma   . CHF (congestive heart failure) (HCC)    takes Furosemide daily   . COPD (chronic obstructive pulmonary disease) (HCC)    Albuterol inhaler prn;SIngulair at night  . Depression    takes Wellbutrin daily  . Diabetes mellitus    takes Metformin daily  . GERD (gastroesophageal reflux disease)    takes Nexium daily  . History of colon polyps    benign  . Hypertension    takes Lisinopril daily  . Iron deficiency anemia 08/01/2017  . Joint pain   . Joint swelling   . Leukocytosis 02/09/2011  . Neck pain    HNP  . Normocytic anemia  02/09/2011  . Peripheral edema    takes Furosemide daily  . Pneumonia    many yrs ago  . Pulmonary nodules/lesions, multiple 08/09/2013  . Shortness of breath dyspnea    with exertion  . Thrombocytosis ([place]) 02/09/2011  . Urinary frequency   . Urinary urgency   . Weakness    numbness and tingling in both hands   Past Surgical History:  Procedure Laterality Date  . ANTERIOR CERVICAL DECOMP/DISCECTOMY FUSION N/A 11/17/2014   Procedure: Cervical four- cervical five Anterior Cervical Decompression with fusion and bonegraft;  Surgeon: Ashok Pall, MD;  Location: [facility];  Service: Neurosurgery;  Laterality: N/A;  C45 anterior cervical decompression with fusion plating and bonegraft  . BIOPSY  02/26/2018   Procedure: BIOPSY;  Surgeon: Danie Binder, MD;  [facility];  Service: Endoscopy;;  (colon) Gastric  duodenal  . COLONOSCOPY    . COLONOSCOPY N/A 02/26/2018   Procedure: COLONOSCOPY;  Surgeon: Danie Binder, MD;  [facility];  Service: Endoscopy;  Laterality: N/A;  9:30am  . ESOPHAGOGASTRODUODENOSCOPY N/A 02/26/2018   Procedure: ESOPHAGOGASTRODUODENOSCOPY (EGD);  Surgeon: Danie Binder, MD;  [facility];  Service: Endoscopy;  Laterality: N/A;  . FEMUR IM NAIL Right 05/23/2016  . FEMUR IM NAIL Right 05/23/2016   Procedure: INTRAMEDULLARY (IM) RETROGRADE FEMORAL NAILING;  Surgeon: [name], MD;  Location: [facility];  Service: Orthopedics;  Laterality: Right;  . KNEE ARTHROPLASTY Left 08/10/2013   Procedure:  TOTAL KNEE ARTHROPLASTY;  Surgeon: [name], MD;  Location: [facility];  Service: Orthopedics;  Laterality: Left;  Left Total Knee Arthroplasty Revision, Cemented, Semi-constrained,   . LEFT AND RIGHT HEART CATHETERIZATION WITH CORONARY ANGIOGRAM N/A 01/07/2012   Procedure: LEFT AND RIGHT HEART CATHETERIZATION WITH CORONARY ANGIOGRAM;  Surgeon: Thayer Headings, MD;  [facility];  Service: Cardiovascular;  Laterality: N/A;  .  TONSILLECTOMY     as a child  . TOTAL KNEE ARTHROPLASTY Bilateral      A IV Location/Drains/Wounds Patient Lines/Drains/Airways Status   Active Line/Drains/Airways    Name:   Placement date:   Placement time:   Site:   Days:   Peripheral IV 12/15/18 Right Hand   12/15/18    1652    Hand   1   Peripheral IV 12/15/18 Right Forearm   12/15/18    2005    Forearm   1   Closed System Drain   05/23/16    1941    -   937   Urethral Catheter Willene Hatchet, RN Latex;Straight-tip 16 Fr.   08/10/13    1311    Latex;Straight-tip   1954   Incision (Closed) 08/10/13 Knee Left   08/10/13    1332     1954   Incision (Closed) 11/17/14 Neck   11/17/14    1513     1490   Incision (Closed) 05/23/16 Knee Right   05/23/16    2032     937          Intake/Output Last 24 hours  Intake/Output Summary (Last 24 hours) at 12/16/2018 1301 Last data filed at 12/16/2018 1210 Gross per 24 hour  Intake 3 ml  Output -  Net 3 ml    Labs/Imaging Results for orders placed or performed during the hospital encounter of 12/15/18 (from the past 48 hour(s))  CBC with Differential     Status: Abnormal   Collection Time: 12/15/18  2:53 PM  Result Value Ref Range   WBC 13.4 (H) 4.0 - 10.5 K/uL   RBC 4.59 3.87 - 5.11 MIL/uL   Hemoglobin 14.2 12.0 - 15.0 g/dL   HCT 43.9 36.0 - 46.0 %   MCV 95.6 80.0 - 100.0 fL   MCH 30.9 26.0 - 34.0 pg   MCHC 32.3 30.0 - 36.0 g/dL   RDW 14.2 11.5 - 15.5 %   Platelets 359 150 - 400 K/uL   nRBC 0.0 0.0 - 0.2 %   Neutrophils Relative % 90 %   Neutro Abs 12.0 (H) 1.7 - 7.7 K/uL   Lymphocytes Relative 8 %   Lymphs Abs 1.0 0.7 - 4.0 K/uL   Monocytes Relative 1 %   Monocytes Absolute 0.1 0.1 - 1.0 K/uL   Eosinophils Relative 1 %   Eosinophils Absolute 0.1 0.0 - 0.5 K/uL   Basophils Relative 0 %   Basophils Absolute 0.0 0.0 - 0.1 K/uL   Immature Granulocytes 0 %   Abs Immature Granulocytes 0.03 0.00 - 0.07 K/uL    Comment: Performed at [facility], [address],  [place], [place] [id number]  Basic metabolic panel     Status: Abnormal   Collection Time: 12/15/18  2:53 PM  Result Value Ref Range   Sodium 136 135 - 145 mmol/L   Potassium 4.8 3.5 - 5.1 mmol/L   Chloride 101 98 - 111 mmol/L  CO2 24 22 - 32 mmol/L   Glucose, Bld 99 70 - 99 mg/dL   BUN 17 8 - 23 mg/dL   Creatinine, Ser 1.02 (H) 0.44 - 1.00 mg/dL   Calcium 8.9 8.9 - 10.3 mg/dL   GFR calc non Af Amer 59 (L) >60 mL/min   GFR calc Af Amer >60 >60 mL/min   Anion gap 11 5 - 15    Comment: Performed at [facility] [address], [place], [place] [address]  Brain natriuretic peptide     Status: None   Collection Time: 12/15/18  2:53 PM  Result Value Ref Range   B Natriuretic Peptide 75.1 0.0 - 100.0 pg/mL    Comment: Performed at [facility] [address], [place], [place] [address]  APTT     Status: None   Collection Time: 12/15/18  3:59 PM  Result Value Ref Range   aPTT 34 24 - 36 seconds    Comment: Performed at [facility] [address], [place], [place] [address]  Protime-INR     Status: None   Collection Time: 12/15/18  3:59 PM  Result Value Ref Range   Prothrombin Time 13.8 11.4 - 15.2 seconds   INR 1.1 0.8 - 1.2    Comment: (NOTE) INR goal varies based on device and disease states. Performed at [facility], [place] [address] [place], [place] [address]   Blood Culture (routine x 2)     Status: None (Preliminary result)   Collection Time: 12/15/18  4:00 PM   Specimen: BLOOD RIGHT ARM  Result Value Ref Range   Specimen Description BLOOD RIGHT ARM    Special Requests      BOTTLES DRAWN AEROBIC AND ANAEROBIC Blood Culture adequate volume   Culture      NO GROWTH < 24 HOURS Performed at [facility], [place] [address] [place], [place] [address]    Report Status PENDING   Lactic acid, plasma     Status: None   Collection Time: 12/15/18  4:15 PM  Result Value Ref Range   Lactic Acid, Venous 1.1 0.5 - 1.9 mmol/L    Comment: Performed at [facility] [address], [place], [place] [address]  SARS Coronavirus 2 [facility] order, Performed in [facility] hospital lab) Nasopharyngeal Nasopharyngeal Swab     Status: None   Collection Time: 12/15/18  4:35 PM   Specimen: Nasopharyngeal Swab  Result Value Ref Range   SARS Coronavirus 2 NEGATIVE NEGATIVE    Comment: (NOTE) If result is NEGATIVE SARS-CoV-2 target nucleic acids are NOT DETECTED. The SARS-CoV-2 RNA is generally detectable in upper and lower  respiratory specimens during the acute phase of infection. The lowest  concentration of SARS-CoV-2 viral copies this assay can detect is 250  copies / mL. A negative result does not preclude SARS-CoV-2 infection  and should not be used as the sole basis for treatment or other  patient management decisions.  A negative result may occur with  improper specimen collection / handling, submission of specimen other  than nasopharyngeal swab, presence of viral mutation(s) within the  areas targeted by this assay, and inadequate number of viral copies  (<250 copies / mL). A negative result must be combined with clinical  observations, patient history, and epidemiological information. If result is POSITIVE SARS-CoV-2 target nucleic acids are DETECTED. The SARS-CoV-2 RNA is generally detectable in upper and lower  respiratory specimens dur ing the  acute phase of infection.  Positive  results are indicative of active infection with SARS-CoV-2.  Clinical  correlation with patient history and other diagnostic information is  necessary to determine patient infection status.  Positive results do  not rule out bacterial infection or co-infection with other viruses. If result is PRESUMPTIVE POSTIVE SARS-CoV-2 nucleic acids MAY BE PRESENT.   A presumptive positive result was obtained on the submitted specimen  and confirmed on repeat testing.  While 2019 novel coronavirus  (SARS-CoV-2) nucleic acids may be present in the submitted sample   additional confirmatory testing may be necessary for epidemiological  and / or clinical management purposes  to differentiate between  SARS-CoV-2 and other Sarbecovirus currently known to infect humans.  If clinically indicated additional testing with an alternate test  methodology [id number]) is advised. The SARS-CoV-2 RNA is generally  detectable in upper and lower respiratory sp ecimens during the acute  phase of infection. The expected result is Negative. Fact Sheet for Patients:  StrictlyIdeas.no Fact Sheet for Healthcare Providers: BankingDealers.co.za This test is not yet approved or cleared by the [place] FDA and has been authorized for detection and/or diagnosis of SARS-CoV-2 by FDA under an Emergency Use Authorization (EUA).  This EUA will remain in effect (meaning this test can be used) for the duration of the COVID-19 declaration under Section 564(b)(1) of the Act, 21 U.S.C. section 360bbb-3(b)(1), unless the authorization is terminated or revoked sooner. Performed at [facility], [place] [address], [place], [place] [address]   I-STAT 7, (LYTES, BLD GAS, ICA, H+H)     Status: Abnormal   Collection Time: 12/15/18  5:05 PM  Result Value Ref Range   pH, Arterial 7.350 7.350 - 7.450   pCO2 arterial 44.3 32.0 - 48.0 mmHg   pO2, Arterial 35.0 (LL) 83.0 - 108.0 mmHg   Bicarbonate 24.5 20.0 - 28.0 mmol/L   TCO2 26 22 - 32 mmol/L   O2 Saturation 64.0 %   Acid-base deficit 1.0 0.0 - 2.0 mmol/L   Sodium 136 135 - 145 mmol/L   Potassium 4.8 3.5 - 5.1 mmol/L   Calcium, Ion 1.19 1.15 - 1.40 mmol/L   HCT 44.0 36.0 - 46.0 %   Hemoglobin 15.0 12.0 - 15.0 g/dL   Patient temperature 98.5 F    Collection site RADIAL, ALLEN'S TEST ACCEPTABLE    Drawn by RT    Sample type ARTERIAL    Comment NOTIFIED PHYSICIAN   Blood Culture (routine x 2)     Status: None (Preliminary result)   Collection Time: 12/15/18  5:11 PM   Specimen:  BLOOD RIGHT HAND  Result Value Ref Range   Specimen Description BLOOD RIGHT HAND    Special Requests      BOTTLES DRAWN AEROBIC AND ANAEROBIC Blood Culture adequate volume   Culture      NO GROWTH < 24 HOURS Performed at [facility], [address] [place], [place] [address]    Report Status PENDING   D-dimer, quantitative     Status: None   Collection Time: 12/15/18  9:21 PM  Result Value Ref Range   D-Dimer, Quant 0.40 0.00 - 0.50 ug/mL-FEU    Comment: (NOTE) At the manufacturer cut-off of 0.50 ug/mL FEU, this assay has been documented to exclude PE with a sensitivity and negative predictive value of 97 to 99%.  At this time, this assay has not been approved by the FDA to exclude DVT/VTE. Results should be correlated with clinical presentation.  Performed at [facility], [place] [address], [place], [place] [address]   Procalcitonin     Status: None   Collection Time: 12/15/18  9:21 PM  Result Value Ref Range   Procalcitonin <0.10 ng/mL    Comment:        Interpretation: PCT (Procalcitonin) <= 0.5 ng/mL: Systemic infection (sepsis) is not likely. Local bacterial infection is possible. (NOTE)       Sepsis PCT Algorithm           Lower Respiratory Tract                                      Infection PCT Algorithm    ----------------------------     ----------------------------         PCT < 0.25 ng/mL                PCT < 0.10 ng/mL         Strongly encourage             Strongly discourage   discontinuation of antibiotics    initiation of antibiotics    ----------------------------     -----------------------------       PCT 0.25 - 0.50 ng/mL            PCT 0.10 - 0.25 ng/mL               OR       >80% decrease in PCT            Discourage initiation of                                            antibiotics      Encourage discontinuation           of antibiotics    ----------------------------     -----------------------------         PCT >= 0.50 ng/mL               PCT 0.26 - 0.50 ng/mL               AND        <80% decrease in PCT             Encourage initiation of                                             antibiotics       Encourage continuation           of antibiotics    ----------------------------     -----------------------------        PCT >= 0.50 ng/mL                  PCT > 0.50 ng/mL               AND         increase in PCT                  Strongly encourage  initiation of antibiotics    Strongly encourage escalation           of antibiotics                                     -----------------------------                                           PCT <= 0.25 ng/mL                                                 OR                                        > 80% decrease in PCT                                     Discontinue / Do not initiate                                             antibiotics Performed at [facility], [address] [place], [place] [address]   Lactate dehydrogenase     Status: None   Collection Time: 12/15/18  9:21 PM  Result Value Ref Range   LDH 144 98 - 192 U/L    Comment: Performed at [facility], [place] [address], [place], [place] [address]  Ferritin     Status: None   Collection Time: 12/15/18  9:21 PM  Result Value Ref Range   Ferritin 41 11 - 307 ng/mL    Comment: Performed at [facility] [address], [place], [place] [address]  Fibrinogen     Status: None   Collection Time: 12/15/18  9:21 PM  Result Value Ref Range   Fibrinogen 394 210 - 475 mg/dL    Comment: Performed at [facility] [address] [place], [place] [address]  C-reactive protein     Status: None   Collection Time: 12/15/18  9:21 PM  Result Value Ref Range   CRP <0.8 <1.0 mg/dL    Comment: Performed at [facility], [place] [address], [place], [place] [address]  Magnesium     Status: Abnormal   Collection Time: 12/15/18  9:21 PM  Result Value Ref  Range   Magnesium 1.2 (L) 1.7 - 2.4 mg/dL    Comment: Performed at [facility] [address], [place], [place] [address]  Urinalysis, Routine w reflex microscopic     Status: Abnormal   Collection Time: 12/15/18 10:00 PM  Result Value Ref Range   Color, Urine STRAW (A) YELLOW   APPearance CLEAR CLEAR   Specific Gravity, Urine 1.009 1.005 - 1.030   pH 5.0 5.0 - 8.0   Glucose, UA NEGATIVE NEGATIVE mg/dL   Hgb urine dipstick SMALL (A) NEGATIVE   Bilirubin Urine  NEGATIVE NEGATIVE   Ketones, ur NEGATIVE NEGATIVE mg/dL   Protein, ur NEGATIVE NEGATIVE mg/dL   Nitrite NEGATIVE NEGATIVE   Leukocytes,Ua NEGATIVE NEGATIVE   RBC / HPF 0-5 0 - 5 RBC/hpf   WBC, UA 0-5 0 - 5 WBC/hpf   Bacteria, UA NONE SEEN NONE SEEN   Squamous Epithelial / LPF 0-5 0 - 5    Comment: Performed at [facility], [place] [address] [place], [place] [address]  Triglycerides     Status: None   Collection Time: 12/15/18 10:35 PM  Result Value Ref Range   Triglycerides 44 <150 mg/dL    Comment: Performed at [facility] [address], [place], [place] [address]  Troponin I (High Sensitivity)     Status: None   Collection Time: 12/15/18 10:35 PM  Result Value Ref Range   Troponin I (High Sensitivity) 7 <18 ng/L    Comment: (NOTE) Elevated high sensitivity troponin I (hsTnI) values and significant  changes across serial measurements may suggest ACS but many other  chronic and acute conditions are known to elevate hsTnI results.  Refer to the "Links" section for chest pain algorithms and additional  guidance. Performed at [facility], [place] [address], [place], [place] [address]   CBG monitoring, ED     Status: Abnormal   Collection Time: 12/15/18 11:04 PM  Result Value Ref Range   Glucose-Capillary 155 (H) 70 - 99 mg/dL  I-STAT 7, (LYTES, BLD GAS, ICA, H+H)     Status: Abnormal   Collection Time: 12/15/18 11:05 PM  Result Value Ref Range   pH, Arterial 7.322 (L) 7.350 - 7.450   pCO2  arterial 47.5 32.0 - 48.0 mmHg   pO2, Arterial 76.0 (L) 83.0 - 108.0 mmHg   Bicarbonate 24.6 20.0 - 28.0 mmol/L   TCO2 26 22 - 32 mmol/L   O2 Saturation 94.0 %   Acid-base deficit 2.0 0.0 - 2.0 mmol/L   Sodium 137 135 - 145 mmol/L   Potassium 4.8 3.5 - 5.1 mmol/L   Calcium, Ion 1.18 1.15 - 1.40 mmol/L   HCT 43.0 36.0 - 46.0 %   Hemoglobin 14.6 12.0 - 15.0 g/dL   Patient temperature 98.6 F    Collection site RADIAL, ALLEN'S TEST ACCEPTABLE    Drawn by RT    Sample type ARTERIAL   Troponin I (High Sensitivity)     Status: None   Collection Time: 12/16/18 12:19 AM  Result Value Ref Range   Troponin I (High Sensitivity) 8 <18 ng/L    Comment: (NOTE) Elevated high sensitivity troponin I (hsTnI) values and significant  changes across serial measurements may suggest ACS but many other  chronic and acute conditions are known to elevate hsTnI results.  Refer to the "Links" section for chest pain algorithms and additional  guidance. Performed at [facility], [place] [address] [place], [place] [address]   Respiratory Panel by PCR     Status: None   Collection Time: 12/16/18 12:53 AM   Specimen: Nasopharyngeal Swab; Respiratory  Result Value Ref Range   Adenovirus NOT DETECTED NOT DETECTED   Coronavirus 229E NOT DETECTED NOT DETECTED    Comment: (NOTE) The Coronavirus on the Respiratory Panel, DOES NOT test for the novel  Coronavirus (2019 nCoV)    Coronavirus HKU1 NOT DETECTED NOT DETECTED   Coronavirus NL63 NOT DETECTED NOT DETECTED   Coronavirus OC43 NOT DETECTED NOT DETECTED   Metapneumovirus NOT DETECTED NOT DETECTED  Rhinovirus / Enterovirus NOT DETECTED NOT DETECTED   Influenza A NOT DETECTED NOT DETECTED   Influenza B NOT DETECTED NOT DETECTED   Parainfluenza Virus 1 NOT DETECTED NOT DETECTED   Parainfluenza Virus 2 NOT DETECTED NOT DETECTED   Parainfluenza Virus 3 NOT DETECTED NOT DETECTED   Parainfluenza Virus 4 NOT DETECTED NOT DETECTED   Respiratory Syncytial  Virus NOT DETECTED NOT DETECTED   Bordetella pertussis NOT DETECTED NOT DETECTED   Chlamydophila pneumoniae NOT DETECTED NOT DETECTED   Mycoplasma pneumoniae NOT DETECTED NOT DETECTED    Comment: Performed at [facility], [place] [address], [place], [place] [address]  Hemoglobin A1c     Status: Abnormal   Collection Time: 12/16/18  2:25 AM  Result Value Ref Range   Hgb A1c MFr Bld 5.7 (H) 4.8 - 5.6 %    Comment: (NOTE) Pre diabetes:          5.7%-6.4% Diabetes:              >6.4% Glycemic control for   <7.0% adults with diabetes    Mean Plasma Glucose 116.89 mg/dL    Comment: Performed at [facility] [address] [place] [address], [place], [place] [address]  Comprehensive metabolic panel     Status: Abnormal   Collection Time: 12/16/18  2:25 AM  Result Value Ref Range   Sodium 135 135 - 145 mmol/L   Potassium 4.7 3.5 - 5.1 mmol/L   Chloride 99 98 - 111 mmol/L   CO2 24 22 - 32 mmol/L   Glucose, Bld 147 (H) 70 - 99 mg/dL   BUN 19 8 - 23 mg/dL   Creatinine, Ser 0.96 0.44 - 1.00 mg/dL   Calcium 8.7 (L) 8.9 - 10.3 mg/dL   Total Protein 7.2 6.5 - 8.1 g/dL   Albumin 3.6 3.5 - 5.0 g/dL   AST 13 (L) 15 - 41 U/L   ALT 13 0 - 44 U/L   Alkaline Phosphatase 95 38 - 126 U/L   Total Bilirubin 0.8 0.3 - 1.2 mg/dL   GFR calc non Af Amer >60 >60 mL/min   GFR calc Af Amer >60 >60 mL/min   Anion gap 12 5 - 15    Comment: Performed at [facility] [address], [place], [place] [address]  CBC WITH DIFFERENTIAL     Status: Abnormal   Collection Time: 12/16/18  2:25 AM  Result Value Ref Range   WBC 5.5 4.0 - 10.5 K/uL   RBC 4.26 3.87 - 5.11 MIL/uL   Hemoglobin 13.1 12.0 - 15.0 g/dL   HCT 40.3 36.0 - 46.0 %   MCV 94.6 80.0 - 100.0 fL   MCH 30.8 26.0 - 34.0 pg   MCHC 32.5 30.0 - 36.0 g/dL   RDW 14.1 11.5 - 15.5 %   Platelets 333 150 - 400 K/uL   nRBC 0.0 0.0 - 0.2 %   Neutrophils Relative % 83 %   Neutro Abs 4.6 1.7 - 7.7 K/uL   Lymphocytes Relative 15 %   Lymphs Abs 0.8 0.7 - 4.0  K/uL   Monocytes Relative 1 %   Monocytes Absolute 0.0 (L) 0.1 - 1.0 K/uL   Eosinophils Relative 0 %   Eosinophils Absolute 0.0 0.0 - 0.5 K/uL   Basophils Relative 0 %   Basophils Absolute 0.0 0.0 - 0.1 K/uL   Immature Granulocytes 1 %   Abs Immature Granulocytes 0.03 0.00 - 0.07 K/uL    Comment: Performed at  [facility], [place] [address], [place], [place] [address]  Magnesium     Status: Abnormal   Collection Time: 12/16/18  2:25 AM  Result Value Ref Range   Magnesium 1.3 (L) 1.7 - 2.4 mg/dL    Comment: Performed at [facility] [address], [place], [place] [address]  Phosphorus     Status: None   Collection Time: 12/16/18  2:25 AM  Result Value Ref Range   Phosphorus 4.0 2.5 - 4.6 mg/dL    Comment: Performed at [facility] [address] [place], [place] [address]  TSH     Status: None   Collection Time: 12/16/18  2:25 AM  Result Value Ref Range   TSH 0.452 0.350 - 4.500 uIU/mL    Comment: Performed by a 3rd Generation assay with a functional sensitivity of <=0.01 uIU/mL. Performed at [facility], [place] [address], [place], [place] [address]   CBG monitoring, ED     Status: Abnormal   Collection Time: 12/16/18  7:31 AM  Result Value Ref Range   Glucose-Capillary 125 (H) 70 - 99 mg/dL  CBG monitoring, ED     Status: Abnormal   Collection Time: 12/16/18 10:08 AM  Result Value Ref Range   Glucose-Capillary 108 (H) 70 - 99 mg/dL  SARS Coronavirus 2 [facility] order, Performed in [facility] hospital lab) Nasopharyngeal Nasopharyngeal Swab     Status: None   Collection Time: 12/16/18 10:25 AM   Specimen: Nasopharyngeal Swab  Result Value Ref Range   SARS Coronavirus 2 NEGATIVE NEGATIVE    Comment: (NOTE) If result is NEGATIVE SARS-CoV-2 target nucleic acids are NOT DETECTED. The SARS-CoV-2 RNA is generally detectable in upper and lower  respiratory specimens during the acute phase of infection. The lowest  concentration of SARS-CoV-2 viral copies  this assay can detect is 250  copies / mL. A negative result does not preclude SARS-CoV-2 infection  and should not be used as the sole basis for treatment or other  patient management decisions.  A negative result may occur with  improper specimen collection / handling, submission of specimen other  than nasopharyngeal swab, presence of viral mutation(s) within the  areas targeted by this assay, and inadequate number of viral copies  (<250 copies / mL). A negative result must be combined with clinical  observations, patient history, and epidemiological information. If result is POSITIVE SARS-CoV-2 target nucleic acids are DETECTED. The SARS-CoV-2 RNA is generally detectable in upper and lower  respiratory specimens dur ing the acute phase of infection.  Positive  results are indicative of active infection with SARS-CoV-2.  Clinical  correlation with patient history and other diagnostic information is  necessary to determine patient infection status.  Positive results do  not rule out bacterial infection or co-infection with other viruses. If result is PRESUMPTIVE POSTIVE SARS-CoV-2 nucleic acids MAY BE PRESENT.   A presumptive positive result was obtained on the submitted specimen  and confirmed on repeat testing.  While 2019 novel coronavirus  (SARS-CoV-2) nucleic acids may be present in the submitted sample  additional confirmatory testing may be necessary for epidemiological  and / or clinical management purposes  to differentiate between  SARS-CoV-2 and other Sarbecovirus currently known to infect humans.  If clinically indicated additional testing with an alternate test  methodology [id number]) is advised. The SARS-CoV-2 RNA is generally  detectable in upper and lower respiratory sp ecimens during the acute  phase of infection. The expected result  is Negative. Fact Sheet for Patients:  StrictlyIdeas.no Fact Sheet for Healthcare  Providers: BankingDealers.co.za This test is not yet approved or cleared by the [place] FDA and has been authorized for detection and/or diagnosis of SARS-CoV-2 by FDA under an Emergency Use Authorization (EUA).  This EUA will remain in effect (meaning this test can be used) for the duration of the COVID-19 declaration under Section 564(b)(1) of the Act, 21 U.S.C. section 360bbb-3(b)(1), unless the authorization is terminated or revoked sooner. Performed at [facility], [place] [address], [place], [place] [address]    Ct Angio Chest Pe W And/or Wo Contrast  Result Date: 12/15/2018 CLINICAL DATA:  Interstitial lung disease, shortness of breath EXAM: CT ANGIOGRAPHY CHEST WITH CONTRAST TECHNIQUE: Multidetector CT imaging of the chest was performed using the standard protocol during bolus administration of intravenous contrast. Multiplanar CT image reconstructions and MIPs were obtained to evaluate the vascular anatomy. CONTRAST:  75mL OMNIPAQUE IOHEXOL 350 MG/ML SOLN COMPARISON:  October 24, 2018 FINDINGS: Cardiovascular: There is a optimal opacification of the pulmonary arteries. There is no central,segmental, or subsegmental filling defects within the pulmonary arteries. There is mild cardiomegaly. No pericardial effusion thickening. No evidence right heart strain. There is normal three-vessel brachiocephalic anatomy without proximal stenosis. Scattered aortic atherosclerotic calcifications are seen without aneurysmal dilatation. There is coronary artery calcifications present. Mediastinum/Nodes: No hilar, mediastinal, or axillary adenopathy. Thyroid gland, trachea, and esophagus demonstrate no significant findings. Lungs/Pleura: There is areas of air trapping in seen within both lung apices, consistent with emphysematous changes. There is hazy ground-glass opacities seen throughout both lungs. Streaky areas of consolidation seen at both lung bases. Again noted is a tiny 5  mm pulmonary nodule seen within the right upper lung. No pleural effusion is seen. Upper Abdomen: No acute abnormalities present in the visualized portions of the upper abdomen. Musculoskeletal: No chest wall abnormality. No acute or significant osseous findings. Review of the MIP images confirms the above findings. IMPRESSION: 1. No central, segmental, or subsegmental pulmonary embolism. 2. Hazy ground-glass opacities seen throughout both lungs. This could be due to atypical viral infectious etiology, pulmonary edema, and/or inflammatory process. 3. Mild cardiomegaly 4. Findings of emphysematous changes. 5. Unchanged 5 mm pulmonary nodule in the right upper lung. 6. Aortic atherosclerosis 7. Coronary artery calcifications. Electronically Signed   By: Prudencio Pair M.D.   On: 12/15/2018 20:48   Dg Chest Port 1 View  Result Date: 12/15/2018 CLINICAL DATA:  Hypoxia. EXAM: PORTABLE CHEST 1 VIEW COMPARISON:  05/23/2016 chest x-ray and chest CT 10/24/2018 FINDINGS: The heart is within normal limits in size and stable. Stable slightly prominent pulmonary hila. Low lung volumes with vascular crowding and streaky atelectasis. Stable bronchitic type interstitial lung changes and emphysematous changes. No definite infiltrates or effusions. The bony thorax is intact. Stable degenerative changes involving both shoulders. IMPRESSION: 1. Stable underlying emphysematous changes and bronchitic changes. 2. Low lung volumes with vascular crowding and streaky atelectasis. Electronically Signed   By: Marijo Sanes M.D.   On: 12/15/2018 15:51    Pending Labs Unresulted Labs (From admission, onward)    Start     Ordered   12/16/18 0500  CBC WITH DIFFERENTIAL  Daily,   R     12/15/18 2234   12/15/18 2235  HIV Antibody (routine testing w rflx)  (HIV Antibody (Routine testing w reflex) panel)  Once,   STAT     12/15/18 2234   12/15/18 2235  HIV4GL Save Tube  (HIV Antibody (  Routine testing w reflex) panel)  Once,   STAT      12/15/18 2234   12/15/18 2235  Blood gas, arterial  ONCE - STAT,   STAT     12/15/18 2234   12/15/18 2235  Culture, sputum-assessment  Once,   R     12/15/18 2234   12/15/18 1559  Urine culture  ONCE - STAT,   STAT     12/15/18 1558          Vitals/Pain Today's Vitals   12/16/18 1211 12/16/18 1216 12/16/18 1230 12/16/18 1245  BP:  (!) 142/80 (!) 147/134 127/79  Pulse:   61 65  Resp:  18 19 17   Temp:      TempSrc:      SpO2:   96% 99%  Weight:      Height:      PainSc: 0-No pain       Isolation Precautions Airborne and Contact precautions  Medications Medications  insulin aspart (novoLOG) injection 0-5 Units (0 Units Subcutaneous Not Given 12/16/18 0021)  nicotine (NICODERM CQ - dosed in mg/24 hours) patch 21 mg (21 mg Transdermal Patch Applied 12/16/18 1015)  sodium chloride flush (NS) 0.9 % injection 3 mL (0 mLs Intravenous Not Given 12/16/18 1018)  mometasone-formoterol (DULERA) 200-5 MCG/ACT inhaler 1 puff (1 puff Inhalation Not Given 12/16/18 0715)  insulin aspart (novoLOG) injection 0-15 Units (0 Units Subcutaneous Not Given 12/16/18 1009)  budesonide (PULMICORT) nebulizer solution 2 mg (2 mg Nebulization Not Given 12/16/18 1138)  methylPREDNISolone sodium succinate (SOLU-MEDROL) 125 mg/2 mL injection 60 mg (60 mg Intravenous Given 12/16/18 1205)    Followed by  predniSONE (DELTASONE) tablet 40 mg (has no administration in time range)  umeclidinium-vilanterol (ANORO ELLIPTA) 62.5-25 MCG/INH 1 puff (1 puff Inhalation Given 12/16/18 1203)  azithromycin (ZITHROMAX) 500 mg in sodium chloride 0.9 % 250 mL IVPB (0 mg Intravenous Stopped 12/16/18 0318)    Followed by  azithromycin (ZITHROMAX) tablet 500 mg (has no administration in time range)  albuterol (VENTOLIN HFA) 108 (90 Base) MCG/ACT inhaler 1-2 puff (has no administration in time range)  aspirin EC tablet 325 mg (325 mg Oral Given 12/16/18 1017)  amLODipine (NORVASC) tablet 5 mg (5 mg Oral Given 12/16/18 1017)  furosemide  (LASIX) tablet 20 mg (20 mg Oral Given 12/16/18 1017)  buPROPion (WELLBUTRIN XL) 24 hr tablet 150 mg (150 mg Oral Given 12/16/18 1203)  pantoprazole (PROTONIX) EC tablet 40 mg (40 mg Oral Given 12/16/18 1017)  montelukast (SINGULAIR) tablet 10 mg (10 mg Oral Not Given 12/16/18 0131)  acetaminophen (TYLENOL) tablet 650 mg (has no administration in time range)    Or  acetaminophen (TYLENOL) suppository 650 mg (has no administration in time range)  HYDROcodone-acetaminophen (NORCO/VICODIN) 5-325 MG per tablet 1-2 tablet (has no administration in time range)  ondansetron (ZOFRAN) tablet 4 mg (has no administration in time range)    Or  ondansetron (ZOFRAN) injection 4 mg (has no administration in time range)  enoxaparin (LOVENOX) injection 40 mg (40 mg Subcutaneous Not Given 12/16/18 0131)  sodium chloride flush (NS) 0.9 % injection 3 mL (3 mLs Intravenous Given 12/16/18 1210)  sodium chloride flush (NS) 0.9 % injection 3 mL (has no administration in time range)  0.9 %  sodium chloride infusion (has no administration in time range)  albuterol (VENTOLIN HFA) 108 (90 Base) MCG/ACT inhaler 2 puff (2 puffs Inhalation Given 12/15/18 1613)  iohexol (OMNIPAQUE) 350 MG/ML injection 75 mL (75 mLs Intravenous Contrast Given 12/15/18 2023)  furosemide (LASIX) injection 40 mg (40 mg Intravenous Given 12/15/18 2242)  magnesium sulfate IVPB 2 g 50 mL (0 g Intravenous Stopped 12/16/18 1258)    Mobility walks with device Low fall risk   Focused Assessments Pulmonary Assessment Handoff:  Lung sounds: L Breath Sounds: Rhonchi, Expiratory wheezes R Breath Sounds: Rhonchi, Expiratory wheezes, Diminished O2 Device: Nasal Cannula O2 Flow Rate (L/min): 2 L/min      R Recommendations: See Admitting Provider Note  Report given to:   Additional Notes:

## 2018-12-16 NOTE — ED Notes (Signed)
Dietary contacted again for breakfast.

## 2018-12-17 LAB — CBC WITH DIFFERENTIAL/PLATELET
Abs Immature Granulocytes: 0.03 10*3/uL (ref 0.00–0.07)
Basophils Absolute: 0 10*3/uL (ref 0.0–0.1)
Basophils Relative: 0 %
Eosinophils Absolute: 0 10*3/uL (ref 0.0–0.5)
Eosinophils Relative: 0 %
HCT: 37.4 % (ref 36.0–46.0)
Hemoglobin: 12.5 g/dL (ref 12.0–15.0)
Immature Granulocytes: 0 %
Lymphocytes Relative: 19 %
Lymphs Abs: 1.6 10*3/uL (ref 0.7–4.0)
MCH: 30.8 pg (ref 26.0–34.0)
MCHC: 33.4 g/dL (ref 30.0–36.0)
MCV: 92.1 fL (ref 80.0–100.0)
Monocytes Absolute: 0.6 10*3/uL (ref 0.1–1.0)
Monocytes Relative: 7 %
Neutro Abs: 6.4 10*3/uL (ref 1.7–7.7)
Neutrophils Relative %: 74 %
Platelets: 343 10*3/uL (ref 150–400)
RBC: 4.06 MIL/uL (ref 3.87–5.11)
RDW: 13.7 % (ref 11.5–15.5)
WBC: 8.6 10*3/uL (ref 4.0–10.5)
nRBC: 0 % (ref 0.0–0.2)

## 2018-12-17 LAB — GLUCOSE, CAPILLARY
Glucose-Capillary: 74 mg/dL (ref 70–99)
Glucose-Capillary: 108 mg/dL — ABNORMAL HIGH (ref 70–99)
Glucose-Capillary: 127 mg/dL — ABNORMAL HIGH (ref 70–99)
Glucose-Capillary: 132 mg/dL — ABNORMAL HIGH (ref 70–99)

## 2018-12-17 NOTE — TOC Initial Note (Signed)
Transition of Care [facility]) - Initial/Assessment Note    Patient Details  Name: Debbie Mullins MRN: [id number] Date of Birth: [birth year]  [facility] CM/SW Contact:    Pollie Friar, RN Phone Number: 12/17/2018, 4:36 PM  Clinical Narrative:                 CM consulted for home oxygen. CM met with the patient and currently she is refusing home oxygen. She feels she is at her baseline for her breathing. MD updated.  Pt is agreeable to home PT. She doesn't have a choice of [place] agencies.  Pt states she has a new rolling walker at home.  TOC following.  Expected Discharge Plan: [place] Barriers to Discharge: Continued Medical Work up   Patient Goals and CMS Choice   CMS Medicare.gov Compare Post Acute Care list provided to:: Patient Choice offered to / list presented to : Patient  Expected Discharge Plan and Services Expected Discharge Plan: [place]   Discharge Planning Services: CM Consult Post Acute Care Choice: Home Health                                        Prior Living Arrangements/Services   Lives with:: Adult Children Patient language and need for interpreter reviewed:: Yes(no needs) Do you feel safe going back to the place where you live?: Yes      Need for Family Participation in Patient Care: Yes (Comment) Care giver support system in place?: Yes (comment) Current home services: DME(rolling walker) Criminal Activity/Legal Involvement Pertinent to Current Situation/Hospitalization: No - Comment as needed  Activities of Daily Living      Permission Sought/Granted                  Emotional Assessment Appearance:: Appears stated age Attitude/Demeanor/Rapport: Engaged Affect (typically observed): Accepting, Pleasant Orientation: : Oriented to Self, Oriented to Place, Oriented to  Time, Oriented to Situation   Psych Involvement: No (comment)  Admission diagnosis:  Acute pulmonary edema  (HCC) [J81.0] Hypoxia [R09.02] Patient Active Problem List   Diagnosis Date Noted  . Acute respiratory failure with hypoxia ([place]) 12/15/2018  . Acute respiratory failure ([place]) 12/15/2018  . Iron deficiency anemia 08/01/2017  . Displaced supracondylar fracture without intracondylar extension of lower end of right femur, initial encounter for closed fracture ([place]) 05/23/2016  . 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 Closed fracture of right distal femur ([place])   . Osteoarthritis of spine with myelopathy, cervical region 11/17/2014  . 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 Pulmonary nodules/lesions, multiple 08/09/2013  . HLD (hyperlipidemia) 01/22/2012  . Pulmonary hypertension ([place]) 01/08/2012  . Acute on chronic diastolic heart failure ([place]) 01/05/2012  . Pulmonary edema, acute ([place]) 01/03/2012  . 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 HTN (hypertension) 01/02/2012  . COPD with acute exacerbation ([place]) 01/02/2012  . Tobacco abuse 07/04/2011  . 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 Leukocytosis 02/09/2011  . Mild Thrombocytosis 02/09/2011  . Obesity 02/09/2011   PCP:  Lucianne Lei, MD Pharmacy:   [facility], [place] [address] AT [address] [place] [address] Phone: [phone] Fax: [place], [place]  [address] [place] [address] Phone: [phone] Fax: [phone]     Social Determinants of Health (SDOH) Interventions    Readmission Risk Interventions No flowsheet data found.

## 2018-12-17 NOTE — Progress Notes (Signed)
PROGRESS NOTE    Debbie Mullins  [id number] DOB: 10/05/1955 DOA: 12/15/2018 PCP: Lucianne Lei, MD   Brief Narrative:  Patient is a 63 year old female with history of iron deficiency anemia, lung nodules, tobacco abuse, seizure, COPD, depression, diabetes mellitus who presented with complaints of shortness of breath, nonproductive cough.  She was found to have hypoxia on presentation.  Chest x-ray showed some atelectasis, CT scan showed hazy groundglass opacities throughout both lungs.  COVID-19 test was negative.  She needed oxygen supplementation for maintenance of saturation.  She is not on oxygen at home.  She was admitted for acute hypoxic respiratory failure secondary to COPD exacerbation versus pneumonia.  Started on steroids and antibiotics.   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  Not on oxygen at home.  She feels much better today. Covid-19 was  negative.  CT scan done on admission showed bilateral groundglass opacities. Procalcitonin was unremarkable.  Started on azithromycin and steroid.  Continue PRN bronchodilators.  Will try to wean her off oxygen. She is not on inhalers at home.  She needs albuterol inhaler on discharge.  She needs to follow-up with pulmonology as an outpatient for PFT.  Diabetes type 2: Continue sliding scale insulin.  Hypertension: Currently blood stable.  Continue home medicines  Tobacco abuse: Smokes a pack a day.  Counseled for cessation.  Continue nicotine patch  Debility/deconditioning: Ambulates with the help of walker.  Has bilateral knee replacement history          DVT prophylaxis: Lovenox Code Status: Full Family  Communication: None present at the bed side Disposition Plan: Home likely today or tomorrow   Consultants: None  Procedures:None  Antimicrobials:  Anti-infectives (From admission, onward)   Start     Dose/Rate Route Frequency Ordered Stop   12/16/18 2245  azithromycin (ZITHROMAX) tablet 500 mg     500 mg Oral Daily 12/15/18 2234 12/20/18 0959   12/15/18 2234  azithromycin (ZITHROMAX) 500 mg in sodium chloride 0.9 % 250 mL IVPB     500 mg 250 mL/hr over 60 Minutes Intravenous Every 24 hours 12/15/18 2234 12/16/18 0318   12/15/18 1600  cefTRIAXone (ROCEPHIN) 2 g in sodium chloride 0.9 % 100 mL IVPB  Status:  Discontinued     2 g 200 mL/hr over 30 Minutes Intravenous Every 24 hours 12/15/18 1558 12/16/18 1111   12/15/18 1600  azithromycin (ZITHROMAX) 500 mg in sodium chloride 0.9 % 250 mL IVPB  Status:  Discontinued     500 mg 250 mL/hr over 60 Minutes Intravenous Every 24 hours 12/15/18 1558 12/15/18 2220      Subjective: Patient seen and examined the bedside this morning.  Hemodynamically stable.  Sitting in the chair.  Very comfortable.  Denies any shortness of breath or cough.  Lungs clear to auscultation.  Objective: Vitals:   12/16/18 1639 12/16/18 2324 12/17/18 0748 12/17/18 0753  BP: (!) 143/76 122/71  (!) 153/81  Pulse: 85 68  (!) 58  Resp: 18 16  18   Temp: 98.4 F (36.9 C) 98.3 F (36.8 C)  98 F (36.7 C)  TempSrc: Oral Oral  Oral  SpO2: 97% 97%  94% 99%  Weight:      Height:        Intake/Output Summary (Last 24 hours) at 12/17/2018 1210 Last data filed at 12/17/2018 0700 Gross per 24 hour  Intake 825.64 ml  Output 850 ml  Net -24.36 ml   Filed Weights   12/15/18 1514  Weight: 78.5 kg    Examination:  General exam: Appears calm and comfortable ,Not in distress,average built HEENT:PERRL,Oral mucosa moist, Ear/Nose normal on gross exam Respiratory system: Mildly bilateral decreased air entry but no wheezes or crackles. Cardiovascular system: S1 & S2  heard, RRR. No JVD, murmurs, rubs, gallops or clicks. No pedal edema. Gastrointestinal system: Abdomen is nondistended, soft and nontender. No organomegaly or masses felt. Normal bowel sounds heard. Central nervous system: Alert and oriented. No focal neurological deficits. Extremities: No edema, no clubbing ,no cyanosis, distal peripheral pulses palpable. Skin: No rashes, lesions or ulcers,no icterus ,no pallor     Data Reviewed: I have personally reviewed following labs and imaging studies  CBC: Recent Labs  Lab 12/15/18 1453 12/15/18 1705 12/15/18 2305 12/16/18 0225 12/17/18 0401  WBC 13.4*  --   --  5.5 8.6  NEUTROABS 12.0*  --   --  4.6 6.4  HGB 14.2 15.0 14.6 13.1 12.5  HCT 43.9 44.0 43.0 40.3 37.4  MCV 95.6  --   --  94.6 92.1  PLT 359  --   --  333 [id number]   Basic Metabolic Panel: Recent Labs  Lab 12/15/18 1453 12/15/18 1705 12/15/18 2121 12/15/18 2305 12/16/18 0225  NA 136 136  --  137 135  K 4.8 4.8  --  4.8 4.7  CL 101  --   --   --  99  CO2 24  --   --   --  24  GLUCOSE 99  --   --   --  147*  BUN 17  --   --   --  19  CREATININE 1.02*  --   --   --  0.96  CALCIUM 8.9  --   --   --  8.7*  MG  --   --  1.2*  --  1.3*  PHOS  --   --   --   --  4.0   GFR: Estimated Creatinine Clearance: 59.7 mL/min (by C-G formula based on SCr of 0.96 mg/dL). Liver Function Tests: Recent Labs  Lab 12/16/18 0225  AST 13*  ALT 13  ALKPHOS 95  BILITOT 0.8  PROT 7.2  ALBUMIN 3.6   No results for input(s): LIPASE, AMYLASE in the last 168 hours. No results for input(s): AMMONIA in the last 168 hours. Coagulation Profile: Recent Labs  Lab 12/15/18 1559  INR 1.1   Cardiac Enzymes: No results for input(s): CKTOTAL, CKMB, CKMBINDEX, TROPONINI in the last 168 hours. BNP (last 3 results) No results for input(s): PROBNP in the last 8760 hours. HbA1C: Recent Labs    12/16/18 0225  HGBA1C 5.7*   CBG: Recent Labs  Lab 12/16/18 1008 12/16/18 1333 12/16/18 1635  12/16/18 2213 12/17/18 0750  GLUCAP 108* 119* 149* 109* 74   Lipid Profile: Recent Labs    12/15/18 2235  TRIG 44   Thyroid Function Tests: Recent Labs    12/16/18 0225  TSH 0.452   Anemia Panel: Recent Labs    12/15/18 2121  FERRITIN 41   Sepsis Labs: Recent Labs  Lab 12/15/18 1615 12/15/18 2121  PROCALCITON  --  <0.10  LATICACIDVEN 1.1  --  Recent Results (from the past 240 hour(s))  Blood Culture (routine x 2)     Status: None (Preliminary result)   Collection Time: 12/15/18  4:00 PM   Specimen: BLOOD RIGHT ARM  Result Value Ref Range Status   Specimen Description BLOOD RIGHT ARM  Final   Special Requests   Final    BOTTLES DRAWN AEROBIC AND ANAEROBIC Blood Culture adequate volume   Culture   Final    NO GROWTH 2 DAYS Performed at [facility], [address] [place], [place] [address]    Report Status PENDING  Incomplete  SARS Coronavirus 2 [facility] order, Performed in [facility] hospital lab) Nasopharyngeal Nasopharyngeal Swab     Status: None   Collection Time: 12/15/18  4:35 PM   Specimen: Nasopharyngeal Swab  Result Value Ref Range Status   SARS Coronavirus 2 NEGATIVE NEGATIVE Final    Comment: (NOTE) If result is NEGATIVE SARS-CoV-2 target nucleic acids are NOT DETECTED. The SARS-CoV-2 RNA is generally detectable in upper and lower  respiratory specimens during the acute phase of infection. The lowest  concentration of SARS-CoV-2 viral copies this assay can detect is 250  copies / mL. A negative result does not preclude SARS-CoV-2 infection  and should not be used as the sole basis for treatment or other  patient management decisions.  A negative result may occur with  improper specimen collection / handling, submission of specimen other  than nasopharyngeal swab, presence of viral mutation(s) within the  areas targeted by this assay, and inadequate number of viral copies  (<250 copies / mL). A negative result must be combined with  clinical  observations, patient history, and epidemiological information. If result is POSITIVE SARS-CoV-2 target nucleic acids are DETECTED. The SARS-CoV-2 RNA is generally detectable in upper and lower  respiratory specimens dur ing the acute phase of infection.  Positive  results are indicative of active infection with SARS-CoV-2.  Clinical  correlation with patient history and other diagnostic information is  necessary to determine patient infection status.  Positive results do  not rule out bacterial infection or co-infection with other viruses. If result is PRESUMPTIVE POSTIVE SARS-CoV-2 nucleic acids MAY BE PRESENT.   A presumptive positive result was obtained on the submitted specimen  and confirmed on repeat testing.  While 2019 novel coronavirus  (SARS-CoV-2) nucleic acids may be present in the submitted sample  additional confirmatory testing may be necessary for epidemiological  and / or clinical management purposes  to differentiate between  SARS-CoV-2 and other Sarbecovirus currently known to infect humans.  If clinically indicated additional testing with an alternate test  methodology [phone]) is advised. The SARS-CoV-2 RNA is generally  detectable in upper and lower respiratory sp ecimens during the acute  phase of infection. The expected result is Negative. Fact Sheet for Patients:  StrictlyIdeas.no Fact Sheet for Healthcare Providers: BankingDealers.co.za This test is not yet approved or cleared by the [place] FDA and has been authorized for detection and/or diagnosis of SARS-CoV-2 by FDA under an Emergency Use Authorization (EUA).  This EUA will remain in effect (meaning this test can be used) for the duration of the COVID-19 declaration under Section 564(b)(1) of the Act, 21 U.S.C. section 360bbb-3(b)(1), unless the authorization is terminated or revoked sooner. Performed at [facility], [place]  [address] [place], [place] [address]   Blood Culture (routine x 2)     Status: None (Preliminary result)   Collection Time: 12/15/18  5:11 PM  Specimen: BLOOD RIGHT HAND  Result Value Ref Range Status   Specimen Description BLOOD RIGHT HAND  Final   Special Requests   Final    BOTTLES DRAWN AEROBIC AND ANAEROBIC Blood Culture adequate volume   Culture   Final    NO GROWTH 2 DAYS Performed at [facility], [address], [place], [place] [address]    Report Status PENDING  Incomplete  Urine culture     Status: Abnormal   Collection Time: 12/15/18 10:08 PM   Specimen: In/Out Cath Urine  Result Value Ref Range Status   Specimen Description IN/OUT CATH URINE  Final   Special Requests   Final    NONE Performed at [facility], [place] [address] [place], [place] [address]    Culture MULTIPLE SPECIES PRESENT, SUGGEST RECOLLECTION (A)  Final   Report Status 12/16/2018 FINAL  Final  Respiratory Panel by PCR     Status: None   Collection Time: 12/16/18 12:53 AM   Specimen: Nasopharyngeal Swab; Respiratory  Result Value Ref Range Status   Adenovirus NOT DETECTED NOT DETECTED Final   Coronavirus 229E NOT DETECTED NOT DETECTED Final    Comment: (NOTE) The Coronavirus on the Respiratory Panel, DOES NOT test for the novel  Coronavirus (2019 nCoV)    Coronavirus HKU1 NOT DETECTED NOT DETECTED Final   Coronavirus NL63 NOT DETECTED NOT DETECTED Final   Coronavirus OC43 NOT DETECTED NOT DETECTED Final   Metapneumovirus NOT DETECTED NOT DETECTED Final   Rhinovirus / Enterovirus NOT DETECTED NOT DETECTED Final   Influenza A NOT DETECTED NOT DETECTED Final   Influenza B NOT DETECTED NOT DETECTED Final   Parainfluenza Virus 1 NOT DETECTED NOT DETECTED Final   Parainfluenza Virus 2 NOT DETECTED NOT DETECTED Final   Parainfluenza Virus 3 NOT DETECTED NOT DETECTED Final   Parainfluenza Virus 4 NOT DETECTED NOT DETECTED Final   Respiratory Syncytial Virus NOT DETECTED NOT DETECTED Final    Bordetella pertussis NOT DETECTED NOT DETECTED Final   Chlamydophila pneumoniae NOT DETECTED NOT DETECTED Final   Mycoplasma pneumoniae NOT DETECTED NOT DETECTED Final    Comment: Performed at [facility], [place]. [address] [place], [place] [address]  SARS Coronavirus 2 [facility] order, Performed in [facility] hospital lab) Nasopharyngeal Nasopharyngeal Swab     Status: None   Collection Time: 12/16/18 10:25 AM   Specimen: Nasopharyngeal Swab  Result Value Ref Range Status   SARS Coronavirus 2 NEGATIVE NEGATIVE Final    Comment: (NOTE) If result is NEGATIVE SARS-CoV-2 target nucleic acids are NOT DETECTED. The SARS-CoV-2 RNA is generally detectable in upper and lower  respiratory specimens during the acute phase of infection. The lowest  concentration of SARS-CoV-2 viral copies this assay can detect is 250  copies / mL. A negative result does not preclude SARS-CoV-2 infection  and should not be used as the sole basis for treatment or other  patient management decisions.  A negative result may occur with  improper specimen collection / handling, submission of specimen other  than nasopharyngeal swab, presence of viral mutation(s) within the  areas targeted by this assay, and inadequate number of viral copies  (<250 copies / mL). A negative result must be combined with clinical  observations, patient history, and epidemiological information. If result is POSITIVE SARS-CoV-2 target nucleic acids are DETECTED. The SARS-CoV-2 RNA is generally detectable in upper and lower  respiratory specimens dur ing the acute phase of infection.  Positive  results are  indicative of active infection with SARS-CoV-2.  Clinical  correlation with patient history and other diagnostic information is  necessary to determine patient infection status.  Positive results do  not rule out bacterial infection or co-infection with other viruses. If result is PRESUMPTIVE POSTIVE SARS-CoV-2 nucleic acids MAY BE  PRESENT.   A presumptive positive result was obtained on the submitted specimen  and confirmed on repeat testing.  While 2019 novel coronavirus  (SARS-CoV-2) nucleic acids may be present in the submitted sample  additional confirmatory testing may be necessary for epidemiological  and / or clinical management purposes  to differentiate between  SARS-CoV-2 and other Sarbecovirus currently known to infect humans.  If clinically indicated additional testing with an alternate test  methodology [phone]) is advised. The SARS-CoV-2 RNA is generally  detectable in upper and lower respiratory sp ecimens during the acute  phase of infection. The expected result is Negative. Fact Sheet for Patients:  StrictlyIdeas.no Fact Sheet for Healthcare Providers: BankingDealers.co.za This test is not yet approved or cleared by the [place] FDA and has been authorized for detection and/or diagnosis of SARS-CoV-2 by FDA under an Emergency Use Authorization (EUA).  This EUA will remain in effect (meaning this test can be used) for the duration of the COVID-19 declaration under Section 564(b)(1) of the Act, 21 U.S.C. section 360bbb-3(b)(1), unless the authorization is terminated or revoked sooner. Performed at [facility], [place] [address], [place], [place] [address]          Radiology Studies: Ct Angio Chest Pe W And/or Wo Contrast  Result Date: 12/15/2018 CLINICAL DATA:  Interstitial lung disease, shortness of breath EXAM: CT ANGIOGRAPHY CHEST WITH CONTRAST TECHNIQUE: Multidetector CT imaging of the chest was performed using the standard protocol during bolus administration of intravenous contrast. Multiplanar CT image reconstructions and MIPs were obtained to evaluate the vascular anatomy. CONTRAST:  52mL OMNIPAQUE IOHEXOL 350 MG/ML SOLN COMPARISON:  October 24, 2018 FINDINGS: Cardiovascular: There is a optimal opacification of the pulmonary  arteries. There is no central,segmental, or subsegmental filling defects within the pulmonary arteries. There is mild cardiomegaly. No pericardial effusion thickening. No evidence right heart strain. There is normal three-vessel brachiocephalic anatomy without proximal stenosis. Scattered aortic atherosclerotic calcifications are seen without aneurysmal dilatation. There is coronary artery calcifications present. Mediastinum/Nodes: No hilar, mediastinal, or axillary adenopathy. Thyroid gland, trachea, and esophagus demonstrate no significant findings. Lungs/Pleura: There is areas of air trapping in seen within both lung apices, consistent with emphysematous changes. There is hazy ground-glass opacities seen throughout both lungs. Streaky areas of consolidation seen at both lung bases. Again noted is a tiny 5 mm pulmonary nodule seen within the right upper lung. No pleural effusion is seen. Upper Abdomen: No acute abnormalities present in the visualized portions of the upper abdomen. Musculoskeletal: No chest wall abnormality. No acute or significant osseous findings. Review of the MIP images confirms the above findings. IMPRESSION: 1. No central, segmental, or subsegmental pulmonary embolism. 2. Hazy ground-glass opacities seen throughout both lungs. This could be due to atypical viral infectious etiology, pulmonary edema, and/or inflammatory process. 3. Mild cardiomegaly 4. Findings of emphysematous changes. 5. Unchanged 5 mm pulmonary nodule in the right upper lung. 6. Aortic atherosclerosis 7. Coronary artery calcifications. Electronically Signed   By: Prudencio Pair M.D.   On: 12/15/2018 20:48   Dg Chest Port 1 View  Result Date: 12/15/2018 CLINICAL DATA:  Hypoxia. EXAM: PORTABLE CHEST 1 VIEW COMPARISON:  05/23/2016 chest x-ray and chest CT 10/24/2018  FINDINGS: The heart is within normal limits in size and stable. Stable slightly prominent pulmonary hila. Low lung volumes with vascular crowding and streaky  atelectasis. Stable bronchitic type interstitial lung changes and emphysematous changes. No definite infiltrates or effusions. The bony thorax is intact. Stable degenerative changes involving both shoulders. IMPRESSION: 1. Stable underlying emphysematous changes and bronchitic changes. 2. Low lung volumes with vascular crowding and streaky atelectasis. Electronically Signed   By: Marijo Sanes M.D.   On: 12/15/2018 15:51        Scheduled Meds: . amLODipine  5 mg Oral q morning - 10a  . aspirin EC  325 mg Oral q morning - 10a  . azithromycin  500 mg Oral Daily  . budesonide (PULMICORT) nebulizer solution  2 mg Nebulization BID  . buPROPion  150 mg Oral q morning - 10a  . enoxaparin (LOVENOX) injection  40 mg Subcutaneous Q24H  . furosemide  20 mg Oral QODAY  . insulin aspart  0-15 Units Subcutaneous TID WC  . insulin aspart  0-5 Units Subcutaneous QHS  . mometasone-formoterol  1 puff Inhalation BID  . montelukast  10 mg Oral QHS  . nicotine  21 mg Transdermal Daily  . pantoprazole  40 mg Oral Daily  . predniSONE  40 mg Oral Q breakfast  . sodium chloride flush  3 mL Intravenous Q12H  . umeclidinium-vilanterol  1 puff Inhalation Daily   Continuous Infusions: . sodium chloride       LOS: 2 days    Time spent: 35 mins.More than 50% of that time was spent in counseling and/or coordination of care.      Shelly Coss, MD [facility] Pager [phone]  If 7PM-7AM, please contact night-coverage [email or web address] Password [email or web address] 12/17/2018, 12:10 PM

## 2018-12-17 NOTE — Progress Notes (Signed)
SATURATION QUALIFICATIONS: (This note is used to comply with regulatory documentation for home oxygen)  Patient Saturations on Room Air at Rest = 94%  Patient Saturations on Room Air while Ambulating = 86%  Patient Saturations on 3 Liters of oxygen while Ambulating =95%  Please briefly explain why patient needs home oxygen:  Patient states does not require home O2 as is not symptomatic with sats of 86% on R/A.  States this is her norm.

## 2018-12-17 NOTE — Evaluation (Signed)
Occupational Therapy Evaluation Patient Details Name: Debbie Mullins MRN: [id number] DOB: [birth year] Today's Date: 12/17/2018    History of Present Illness Pt is a 63 yo female s/p O2 sats 78-80% with acute respiratory failure. Imaging revealing COPD. PMHx: CHF, DM, depression, PNA, SOB, asthma.   Clinical Impression   Pt PTA: living with cousin, but pt very motivated to perform own ADL and mobility. Pt currently performing ADL tasks with supervisionA level to modified independence with ADL. Pt with good energy conservation techniques. Pt requires O2 at this time, she desat to 78% on RA with exertion >100' with RW and requires  2L O2 to increase >90% O2. Pt ambulatory in room and hallway with [place] with RW. Pt appears to already perform own energy conservation techniques with ADL tasks. Pt does not require continued OT skilled services. OT signing off.    Follow Up Recommendations  No OT follow up    Equipment Recommendations  None recommended by OT    Recommendations for Other Services       Precautions / Restrictions Precautions Precautions: Fall;Other (comment) Precaution Comments: watch O2 Restrictions Weight Bearing Restrictions: No      Mobility Bed Mobility               General bed mobility comments: up in chair  Transfers Overall transfer level: Needs assistance Equipment used: 4-wheeled walker Transfers: Sit to/from Stand Sit to Stand: Min guard              Balance Overall balance assessment: History of Falls;Needs assistance Sitting-balance support: Feet supported Sitting balance-Leahy Scale: Fair     Standing balance support: Bilateral upper extremity supported Standing balance-Leahy Scale: Fair Standing balance comment: less than fair dynamically                           ADL either performed or assessed with clinical judgement   ADL Overall ADL's : Needs assistance/impaired Eating/Feeding: Modified  independent;Sitting   Grooming: Modified independent;Standing   Upper Body Bathing: Modified independent;Standing   Lower Body Bathing: Set up;Sitting/lateral leans   Upper Body Dressing : Set up;Sitting   Lower Body Dressing: Set up;Sitting/lateral leans;Sit to/from stand   Toilet Transfer: [place] and Hygiene: Supervision/safety       Functional mobility during ADLs: Supervision/safety;Rolling walker General ADL Comments: Pt supervisionA level to modified independence with ADL. Pt with good energy conservation techniques.     Vision Baseline Vision/History: No visual deficits Vision Assessment?: No apparent visual deficits     Perception     Praxis      Pertinent Vitals/Pain       Hand Dominance Right   Extremity/Trunk Assessment Upper Extremity Assessment Upper Extremity Assessment: Overall WFL for tasks assessed;RUE deficits/detail;LUE deficits/detail RUE Deficits / Details: shoulder flex AROM to 45* BUEs (frozen shoulders) RUE Coordination: decreased gross motor LUE Deficits / Details: shoulder flex AROM to 45* BUEs (frozen shoulders) LUE Coordination: decreased gross motor   Lower Extremity Assessment Lower Extremity Assessment: Defer to PT evaluation;Generalized weakness   Cervical / Trunk Assessment Cervical / Trunk Assessment: Normal   Communication Communication Communication: No difficulties   Cognition Arousal/Alertness: Awake/alert Behavior During Therapy: WFL for tasks assessed/performed Overall Cognitive Status: Within Functional Limits for tasks assessed  General Comments: pt is motivated to see if she can go home off O2   General Comments  x2 falls in the past year; Pt requires O2 at this time, she desat to 78% on RA with exertion >100' with RW and requires  2L O2 to increase  >90% O2.    Exercises     Shoulder Instructions      Home Living  Family/patient expects to be discharged to:: Private residence Living Arrangements: Other relatives(cousin) Available Help at Discharge: Family;Available PRN/intermittently Type of Home: House Home Access: Ramped entrance     Home Layout: One level     Bathroom Shower/Tub: Tub/shower unit;Walk-in shower   Bathroom Toilet: Handicapped height Bathroom Accessibility: Yes How Accessible: Accessible via walker Home Equipment: [place] - 4 wheels;Bedside commode;Grab bars - tub/shower;Shower seat          Prior Functioning/Environment Level of Independence: Independent with assistive device(s)        Comments: IADLs, grocery shopping- uses scooter        OT Problem List: Decreased activity tolerance      OT Treatment/Interventions:      OT Goals(Current goals can be found in the care plan section) Acute Rehab OT Goals Patient Stated Goal: go home OT Goal Formulation: With patient  OT Frequency:     Barriers to D/C:            Co-evaluation              AM-PAC OT "6 Clicks" Daily Activity     Outcome Measure Help from another person eating meals?: None Help from another person taking care of personal grooming?: None Help from another person toileting, which includes using toliet, bedpan, or urinal?: None Help from another person bathing (including washing, rinsing, drying)?: None Help from another person to put on and taking off regular upper body clothing?: None Help from another person to put on and taking off regular lower body clothing?: None 6 Click Score: 24   End of Session Equipment Utilized During Treatment: Gait belt;Rolling walker Nurse Communication: Mobility status  Activity Tolerance: Patient tolerated treatment well Patient left: in chair;with call bell/phone within reach  OT Visit Diagnosis: Muscle weakness (generalized) (M62.81)                Time: [id number] OT Time Calculation (min): 29 min Charges:  OT General Charges $OT Visit: 1  Visit OT Evaluation $OT Eval Moderate Complexity: 1 Mod OT Treatments $Self Care/Home Management : 8-22 mins   Ebony Hail Harold Hedge) Marsa Aris OTR/L Acute Rehabilitation Services Pager: [phone] Office: [place] 12/17/2018, 4:57 PM

## 2018-12-17 NOTE — Evaluation (Signed)
Physical Therapy Evaluation Patient Details Name: Debbie Mullins MRN: [id number] DOB: 06-24-1955 Today's Date: 12/17/2018   History of Present Illness  Pt is a 63 yo female s/p O2 sats 78-80% with acute respiratory failure. Imaging revealing COPD. PMHx: CHF, DM, depression, PNA, SOB, asthma.  Clinical Impression  Pt is up to walk with PT and note instability on RLE due to weakness and mult ortho surgeries on knee, as well as old fracture.  Her plan is to return home with family assist but talked with her about rollator being insufficient support for her R knee and leg.  Pt is in possession of RW from previous knee surgery and will just need validation that it is still in good repair.  First surgery was 5+ years ago on  Knees.  Follow acutely for strengthening and monitoring of vitals, ck to see that O2 is still needed for support of mobility.  Had 99% sat on 2L pre-gait and 93% with rapid recovery to 99% post gait on 2L.  Remained on O2 due to report of sat drop earlier with OT session.     Follow Up Recommendations Home health PT;Supervision for mobility/OOB    Equipment Recommendations  Rolling walker with 5" wheels(if hers is in disrepair)    Recommendations for Other Services       Precautions / Restrictions Precautions Precautions: Fall Precaution Comments: watch O2 sats, needs O2 currently Restrictions Weight Bearing Restrictions: No  Better to use RW rather than rollator due to R LE instabilty     Mobility  Bed Mobility               General bed mobility comments: up in chair when PT arrived  Transfers Overall transfer level: Needs assistance Equipment used: 4-wheeled walker;1 person hand held assist Transfers: Sit to/from Stand Sit to Stand: Min assist         General transfer comment: min assist as pt is offsetting the work on LLE  Ambulation/Gait Ambulation/Gait assistance: Counsellor (Feet): 100 Feet Assistive device: 4-wheeled walker;1  person hand held assist Gait Pattern/deviations: Step-through pattern;Decreased stride length;Wide base of support;Drifts right/left Gait velocity: reduced Gait velocity interpretation: <1.31 ft/sec, indicative of household ambulator General Gait Details: pt has a dropped gait on R hip due to weakness and use of rollator vs 2 wheeled walker  Stairs            Wheelchair Mobility    Modified Rankin (Stroke Patients Only)       Balance Overall balance assessment: History of Falls;Needs assistance Sitting-balance support: Feet supported Sitting balance-Leahy Scale: Fair     Standing balance support: Bilateral upper extremity supported;During functional activity Standing balance-Leahy Scale: Fair Standing balance comment: less than fair dynamically                             Pertinent Vitals/Pain Pain Assessment: No/denies pain    Home Living Family/patient expects to be discharged to:: Private residence Living Arrangements: Other relatives Available Help at Discharge: Family;Available PRN/intermittently Type of Home: House Home Access: Ramped entrance     Home Layout: One level Home Equipment: [place] - 4 wheels;Bedside commode;Grab bars - tub/shower;Shower seat;Walker - 2 wheels      Prior Function Level of Independence: Independent with assistive device(s)         Comments: has sustained falls per pt, and finds R knee to be less stable     Hand Dominance  Dominant Hand: Right    Extremity/Trunk Assessment   Upper Extremity Assessment Upper Extremity Assessment: Defer to OT evaluation RUE Deficits / Details: shoulder flex AROM to 45* BUEs (frozen shoulders) RUE Coordination: decreased gross motor LUE Deficits / Details: shoulder flex AROM to 45* BUEs (frozen shoulders) LUE Coordination: decreased gross motor    Lower Extremity Assessment Lower Extremity Assessment: Generalized weakness;RLE deficits/detail RLE Deficits / Details:  strength hip 3+, knee ext 4-, DF 3+ RLE Coordination: decreased gross motor       Communication   Communication: No difficulties  Cognition Arousal/Alertness: Awake/alert Behavior During Therapy: WFL for tasks assessed/performed Overall Cognitive Status: Within Functional Limits for tasks assessed                                 General Comments: pt is motivated to see if she can go home off O2      General Comments General comments (skin integrity, edema, etc.): 2 falls in the past year    Exercises     Assessment/Plan    PT Assessment Patient needs continued PT services  PT Problem List Decreased strength;Decreased range of motion;Decreased activity tolerance;Decreased balance;Decreased mobility;Decreased coordination;Decreased knowledge of use of DME;Decreased safety awareness;Cardiopulmonary status limiting activity;Obesity       PT Treatment Interventions DME instruction;Gait training;Functional mobility training;Therapeutic activities;Neuromuscular re-education;Balance training;Therapeutic exercise;Patient/family education    PT Goals (Current goals can be found in the Care Plan section)  Acute Rehab PT Goals Patient Stated Goal: to get up and move consistently and go home without O2 PT Goal Formulation: With patient Time For Goal Achievement: 12/31/18 Potential to Achieve Goals: Good    Frequency Min 4X/week   Barriers to discharge Inaccessible home environment;Decreased caregiver support home with PRN coverage, has ramped entrance    Co-evaluation               AM-PAC PT "6 Clicks" Mobility  Outcome Measure Help needed turning from your back to your side while in a flat bed without using bedrails?: None Help needed moving from lying on your back to sitting on the side of a flat bed without using bedrails?: A Little Help needed moving to and from a bed to a chair (including a wheelchair)?: A Little Help needed standing up from a chair using  your arms (e.g., wheelchair or bedside chair)?: A Little Help needed to walk in hospital room?: A Little Help needed climbing 3-5 steps with a railing? : A Lot 6 Click Score: 18    End of Session Equipment Utilized During Treatment: Gait belt;Oxygen Activity Tolerance: Treatment limited secondary to medical complications (Comment) Patient left: in chair;with call bell/phone within reach;with chair alarm set Nurse Communication: Mobility status PT Visit Diagnosis: Unsteadiness on feet (R26.81);Muscle weakness (generalized) (M62.81);Difficulty in walking, not elsewhere classified (R26.2)    Time: 1000-1021 PT Time Calculation (min) (ACUTE ONLY): 21 min   Charges:   PT Evaluation $PT Eval Moderate Complexity: 1 Mod         Ramond Dial 12/17/2018, 10:44 AM   Mee Hives, PT MS Acute Rehab Dept. Number: [place] [facility] [place]

## 2018-12-18 LAB — CBC WITH DIFFERENTIAL/PLATELET
Abs Immature Granulocytes: 0.02 10*3/uL (ref 0.00–0.07)
Basophils Absolute: 0 10*3/uL (ref 0.0–0.1)
Basophils Relative: 0 %
Eosinophils Absolute: 0 10*3/uL (ref 0.0–0.5)
Eosinophils Relative: 0 %
HCT: 40.2 % (ref 36.0–46.0)
Hemoglobin: 13.4 g/dL (ref 12.0–15.0)
Immature Granulocytes: 0 %
Lymphocytes Relative: 26 %
Lymphs Abs: 2.2 10*3/uL (ref 0.7–4.0)
MCH: 30.8 pg (ref 26.0–34.0)
MCHC: 33.3 g/dL (ref 30.0–36.0)
MCV: 92.4 fL (ref 80.0–100.0)
Monocytes Absolute: 0.6 10*3/uL (ref 0.1–1.0)
Monocytes Relative: 6 %
Neutro Abs: 5.8 10*3/uL (ref 1.7–7.7)
Neutrophils Relative %: 68 %
Platelets: 348 10*3/uL (ref 150–400)
RBC: 4.35 MIL/uL (ref 3.87–5.11)
RDW: 13.7 % (ref 11.5–15.5)
WBC: 8.6 10*3/uL (ref 4.0–10.5)
nRBC: 0 % (ref 0.0–0.2)

## 2018-12-18 LAB — GLUCOSE, CAPILLARY
Glucose-Capillary: 76 mg/dL (ref 70–99)
Glucose-Capillary: 147 mg/dL — ABNORMAL HIGH (ref 70–99)

## 2018-12-18 MED ORDER — MOMETASONE FURO-FORMOTEROL FUM 200-5 MCG/ACT IN AERO: 1.0000 | INHALATION_SPRAY | Freq: Two times a day (BID) | RESPIRATORY_TRACT | 1 refills | Status: DC

## 2018-12-18 MED ORDER — AZITHROMYCIN 500 MG PO TABS: 500.0000 mg | ORAL_TABLET | Freq: Every day | ORAL | 0 refills | Status: AC

## 2018-12-18 MED ORDER — ALBUTEROL SULFATE HFA 108 (90 BASE) MCG/ACT IN AERS: 1.0000 | INHALATION_SPRAY | RESPIRATORY_TRACT | 1 refills | Status: DC | PRN

## 2018-12-18 MED ORDER — PREDNISONE 20 MG PO TABS: 40.0000 mg | ORAL_TABLET | Freq: Every day | ORAL | 0 refills | Status: AC

## 2018-12-18 NOTE — Progress Notes (Signed)
Pt discharged via wheelchair with CNA, all IVs removed and all belongings sent with pt. Family to pick up pt.   Pt stated she really wanted to stop smoking and make life style changes and education was given on these topics. AVS given to pt.

## 2018-12-18 NOTE — TOC Transition Note (Signed)
Transition of Care [facility]) - CM/SW Discharge Note   Patient Details  Name: Debbie Mullins MRN: [id number] Date of Birth: December 25, 1955  [facility] of [facility] CM/SW Contact:  Maryclare Labrador, RN Phone Number: 12/18/2018, 9:33 AM   Clinical Narrative:   PTA independent from home with her cousin.  Pts cousin with transport pt home via private vehicle.  Barriers exist for HH to be arrange secondary to pts insurance.  Pt remains in agreement for [facility] accepts pts insurance, pt given medicare.gov HH quality measures for agency and pt is in agreement with [facility]  [facility] accepts pt for HHPT and OT - CM requested orders from attending.   Pt declined needing HHRN.  Pt continues to decline home oxygen - CM KW informed attending 12/17/18.  NO other CM Needs identified - CM signing off    Final next level of care: Home w Home Health Services Barriers to Discharge: Barriers Resolved   Patient Goals and CMS Choice   CMS Medicare.gov Compare Post Acute Care list provided to:: Patient Choice offered to / list presented to : Patient  Discharge Placement                       Discharge Plan and Services   Discharge Planning Services: CM Consult Post Acute Care Choice: Home Health                    HH Arranged: PT, OT [facility] Agency: [place] ([facility]) Date [facility] Agency Contacted: 12/18/18 Time [place]: 0930 Representative spoke with at [place]: [place] ([place]) Interventions     Readmission Risk Interventions No flowsheet data found.

## 2018-12-18 NOTE — Discharge Summary (Signed)
Physician Discharge Summary  Debbie Mullins [id number] DOB: 11/11/1955 DOA: 12/15/2018  PCP: Lucianne Lei, MD  Admit date: 12/15/2018 Discharge date: 12/18/2018  Admitted From: Home Disposition:  Home  Discharge Condition:Stable CODE STATUS:FULL Diet recommendation: Heart Healthy   Brief/Interim Summary: Patient is a 63 year old female with history of iron deficiency anemia, lung nodules, tobacco abuse, seizure, COPD, depression, diabetes mellitus who presented with complaints of shortness of breath, nonproductive cough.  She was found to have hypoxia on presentation.  Chest x-ray showed some atelectasis, CT scan showed hazy groundglass opacities throughout both lungs.  COVID-19 test was negative.  She needed oxygen supplementation for maintenance of saturation.  She is not on oxygen at home.  She was admitted for acute hypoxic respiratory failure secondary to COPD exacerbation versus pneumonia.  Started on steroids and antibiotics.  Currently respiratory status is stable.  Saturating fine on room air but desaturated on ambulation.  Patient doesnot want home oxygen.  PT recommended home health PT.  She is hemodynamically stable for discharge home today.  Following problems were addressed during her hospitalization:  Acute hypoxic respiratory failure secondary to COPD exacerbation: Saturating fine on room air today..  Not on oxygen at home.  She feels much better today. Covid-19 was  negative.  CT scan done on admission showed bilateral groundglass opacities. Procalcitonin was unremarkable.  Started on azithromycin and steroid.  She is not on inhalers at home.  We have prescribed albuterol and Dulera.  She needs short course of prednisone. She needs to follow-up with pulmonology as an outpatient for PFT.  Diabetes type 2: Continue home regimen.  Hypertension: Currently blood stable.  Continue home medicines  Tobacco abuse: Smokes a pack a day.  Counseled for cessation.  Continue  nicotine patch  Debility/deconditioning: Ambulates with the help of walker.  Has bilateral knee replacement history.PT recommended home health.  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 2)Please stop smoking 3)Follow up with pulmonology as an outpatient. 4)Take prescribed medications as instructed.   Increase activity slowly   Complete by: As directed      Allergies as of 12/18/2018   No Known Allergies     Medication List    TAKE these medications   albuterol 108 (90 Base) MCG/ACT inhaler Commonly known as: VENTOLIN HFA Inhale 1-2 puffs into the lungs every 4 (four) hours as needed for wheezing or shortness of breath.   amLODipine 5 MG tablet Commonly known as: NORVASC Take 5 mg by mouth every morning.   aspirin EC 325 MG tablet Take 325 mg by mouth every morning.   azithromycin 500 MG tablet Commonly known as: ZITHROMAX Take 1 tablet (500 mg total) by mouth daily for 2 days.   buPROPion 150 MG 24 hr tablet Commonly known as: WELLBUTRIN XL Take 150 mg by mouth every morning.   celecoxib 200 MG capsule Commonly known as: CELEBREX Take 200 mg by mouth every morning.   Clobetasol Prop Emollient Base 0.05 % emollient cream Apply 1 application topically  2 (two) times daily as needed (itching).   esomeprazole 40 MG capsule Commonly known as: NEXIUM Take 40 mg by mouth daily before breakfast.   furosemide 20 MG tablet Commonly known as: LASIX Take 1 tablet (20 mg total) by mouth every other day.   metFORMIN 500 MG tablet Commonly known as:  GLUCOPHAGE Take 500 mg by mouth 3 (three) times daily with meals.   mometasone-formoterol 200-5 MCG/ACT Aero Commonly known as: DULERA Inhale 1 puff into the lungs 2 (two) times daily.   montelukast 10 MG tablet Commonly known as: SINGULAIR Take 10 mg by mouth at bedtime.   predniSONE 20 MG tablet Commonly known as: DELTASONE Take 2 tablets (40 mg total) by mouth daily with breakfast for 2 days. Start taking on: December 19, 2018            Durable Medical Equipment  (From admission, onward)         Start     Ordered   12/17/18 1500  For home use only DME oxygen  Once    Question Answer Comment  Length of Need Lifetime   Mode or (Route) Nasal cannula   Liters per Minute 2   Frequency Continuous (stationary and portable oxygen unit needed)   Oxygen delivery system Gas      12/17/18 1459         Follow-up Information    Lucianne Lei, MD. Schedule an appointment as soon as possible for a visit in 1 week(s).   Specialty: Family Medicine Contact information: [address] [place] [address] [phone]          No Known Allergies  Consultations:  None   Procedures/Studies: Ct Angio Chest Pe W And/or Wo Contrast  Result Date: 12/15/2018 CLINICAL DATA:  Interstitial lung disease, shortness of breath EXAM: CT ANGIOGRAPHY CHEST WITH CONTRAST TECHNIQUE: Multidetector CT imaging of the chest was performed using the standard protocol during bolus administration of intravenous contrast. Multiplanar CT image reconstructions and MIPs were obtained to evaluate the vascular anatomy. CONTRAST:  36mL OMNIPAQUE IOHEXOL 350 MG/ML SOLN COMPARISON:  October 24, 2018 FINDINGS: Cardiovascular: There is a optimal opacification of the pulmonary arteries. There is no central,segmental, or subsegmental filling defects within the pulmonary arteries. There is mild cardiomegaly. No pericardial effusion thickening. No evidence right heart strain. There is normal three-vessel  brachiocephalic anatomy without proximal stenosis. Scattered aortic atherosclerotic calcifications are seen without aneurysmal dilatation. There is coronary artery calcifications present. Mediastinum/Nodes: No hilar, mediastinal, or axillary adenopathy. Thyroid gland, trachea, and esophagus demonstrate no significant findings. Lungs/Pleura: There is areas of air trapping in seen within both lung apices, consistent with emphysematous changes. There is hazy ground-glass opacities seen throughout both lungs. Streaky areas of consolidation seen at both lung bases. Again noted is a tiny 5 mm pulmonary nodule seen within the right upper lung. No pleural effusion is seen. Upper Abdomen: No acute abnormalities present in the visualized portions of the upper abdomen. Musculoskeletal: No chest wall abnormality. No acute or significant osseous findings. Review of the MIP images confirms the above findings. IMPRESSION: 1. No central, segmental, or subsegmental pulmonary embolism. 2. Hazy ground-glass opacities seen throughout both lungs. This could be due to atypical viral infectious etiology, pulmonary edema, and/or inflammatory process. 3. Mild cardiomegaly 4. Findings of emphysematous changes. 5. Unchanged 5 mm pulmonary nodule in the right upper lung. 6. Aortic atherosclerosis 7. Coronary artery calcifications. Electronically Signed   By: Prudencio Pair M.D.   On: 12/15/2018  20:48   Dg Chest Port 1 View  Result Date: 12/15/2018 CLINICAL DATA:  Hypoxia. EXAM: PORTABLE CHEST 1 VIEW COMPARISON:  05/23/2016 chest x-ray and chest CT 10/24/2018 FINDINGS: The heart is within normal limits in size and stable. Stable slightly prominent pulmonary hila. Low lung volumes with vascular crowding and streaky atelectasis. Stable bronchitic type interstitial lung changes and emphysematous changes. No definite infiltrates or effusions. The bony thorax is intact. Stable degenerative changes involving both shoulders. IMPRESSION: 1. Stable  underlying emphysematous changes and bronchitic changes. 2. Low lung volumes with vascular crowding and streaky atelectasis. Electronically Signed   By: Marijo Sanes M.D.   On: 12/15/2018 15:51       Subjective: Patient seen and examined the bedside this morning.  Hemodynamically stable for discharge.  Discharge Exam: Vitals:   12/17/18 2301 12/18/18 0722  BP: (!) 145/78 (!) 154/98  Pulse:  78  Resp: 16   Temp: 98.5 F (36.9 C) 97.8 F (36.6 C)  SpO2: 96% 95%   Vitals:   12/17/18 1642 12/17/18 2011 12/17/18 2301 12/18/18 0722  BP:   (!) 145/78 (!) 154/98  Pulse: 84 80  78  Resp:  18 16   Temp:   98.5 F (36.9 C) 97.8 F (36.6 C)  TempSrc:   Oral Oral  SpO2: 93% 95% 96% 95%  Weight:      Height:        General: Pt is alert, awake, not in acute distress Cardiovascular: RRR, S1/S2 +, no rubs, no gallops Respiratory: CTA bilaterally, no wheezing, no rhonchi Abdominal: Soft, NT, ND, bowel sounds + Extremities: no edema, no cyanosis    The results of significant diagnostics from this hospitalization (including imaging, microbiology, ancillary and laboratory) are listed below for reference.     Microbiology: Recent Results (from the past 240 hour(s))  Blood Culture (routine x 2)     Status: None (Preliminary result)   Collection Time: 12/15/18  4:00 PM   Specimen: BLOOD RIGHT ARM  Result Value Ref Range Status   Specimen Description BLOOD RIGHT ARM  Final   Special Requests   Final    BOTTLES DRAWN AEROBIC AND ANAEROBIC Blood Culture adequate volume   Culture   Final    NO GROWTH 2 DAYS Performed at [facility], [address] [place], [place] [address]    Report Status PENDING  Incomplete  SARS Coronavirus 2 [facility] order, Performed in [facility] hospital lab) Nasopharyngeal Nasopharyngeal Swab     Status: None   Collection Time: 12/15/18  4:35 PM   Specimen: Nasopharyngeal Swab  Result Value Ref Range Status   SARS Coronavirus 2 NEGATIVE NEGATIVE  Final    Comment: (NOTE) If result is NEGATIVE SARS-CoV-2 target nucleic acids are NOT DETECTED. The SARS-CoV-2 RNA is generally detectable in upper and lower  respiratory specimens during the acute phase of infection. The lowest  concentration of SARS-CoV-2 viral copies this assay can detect is 250  copies / mL. A negative result does not preclude SARS-CoV-2 infection  and should not be used as the sole basis for treatment or other  patient management decisions.  A negative result may occur with  improper specimen collection / handling, submission of specimen other  than nasopharyngeal swab, presence of viral mutation(s) within the  areas targeted by this assay, and inadequate number of viral copies  (<250 copies / mL). A negative result must be combined with clinical  observations, patient history, and epidemiological information. If result is  POSITIVE SARS-CoV-2 target nucleic acids are DETECTED. The SARS-CoV-2 RNA is generally detectable in upper and lower  respiratory specimens dur ing the acute phase of infection.  Positive  results are indicative of active infection with SARS-CoV-2.  Clinical  correlation with patient history and other diagnostic information is  necessary to determine patient infection status.  Positive results do  not rule out bacterial infection or co-infection with other viruses. If result is PRESUMPTIVE POSTIVE SARS-CoV-2 nucleic acids MAY BE PRESENT.   A presumptive positive result was obtained on the submitted specimen  and confirmed on repeat testing.  While 2019 novel coronavirus  (SARS-CoV-2) nucleic acids may be present in the submitted sample  additional confirmatory testing may be necessary for epidemiological  and / or clinical management purposes  to differentiate between  SARS-CoV-2 and other Sarbecovirus currently known to infect humans.  If clinically indicated additional testing with an alternate test  methodology [phone]) is advised. The  SARS-CoV-2 RNA is generally  detectable in upper and lower respiratory sp ecimens during the acute  phase of infection. The expected result is Negative. Fact Sheet for Patients:  StrictlyIdeas.no Fact Sheet for Healthcare Providers: BankingDealers.co.za This test is not yet approved or cleared by the [place] FDA and has been authorized for detection and/or diagnosis of SARS-CoV-2 by FDA under an Emergency Use Authorization (EUA).  This EUA will remain in effect (meaning this test can be used) for the duration of the COVID-19 declaration under Section 564(b)(1) of the Act, 21 U.S.C. section 360bbb-3(b)(1), unless the authorization is terminated or revoked sooner. Performed at [facility], [place] [address], [place], [place] [address]   Blood Culture (routine x 2)     Status: None (Preliminary result)   Collection Time: 12/15/18  5:11 PM   Specimen: BLOOD RIGHT HAND  Result Value Ref Range Status   Specimen Description BLOOD RIGHT HAND  Final   Special Requests   Final    BOTTLES DRAWN AEROBIC AND ANAEROBIC Blood Culture adequate volume   Culture   Final    NO GROWTH 2 DAYS Performed at [facility], [place] [address], [place], [place] [address]    Report Status PENDING  Incomplete  Urine culture     Status: Abnormal   Collection Time: 12/15/18 10:08 PM   Specimen: In/Out Cath Urine  Result Value Ref Range Status   Specimen Description IN/OUT CATH URINE  Final   Special Requests   Final    NONE Performed at [facility], [place] [address] [place], [place] [address]    Culture MULTIPLE SPECIES PRESENT, SUGGEST RECOLLECTION (A)  Final   Report Status 12/16/2018 FINAL  Final  Respiratory Panel by PCR     Status: None   Collection Time: 12/16/18 12:53 AM   Specimen: Nasopharyngeal Swab; Respiratory  Result Value Ref Range Status   Adenovirus NOT DETECTED NOT DETECTED Final   Coronavirus 229E NOT DETECTED NOT  DETECTED Final    Comment: (NOTE) The Coronavirus on the Respiratory Panel, DOES NOT test for the novel  Coronavirus (2019 nCoV)    Coronavirus HKU1 NOT DETECTED NOT DETECTED Final   Coronavirus NL63 NOT DETECTED NOT DETECTED Final   Coronavirus OC43 NOT DETECTED NOT DETECTED Final   Metapneumovirus NOT DETECTED NOT DETECTED Final   Rhinovirus / Enterovirus NOT DETECTED NOT DETECTED Final   Influenza A NOT DETECTED NOT DETECTED Final   Influenza B NOT DETECTED NOT DETECTED Final   Parainfluenza Virus 1 NOT  DETECTED NOT DETECTED Final   Parainfluenza Virus 2 NOT DETECTED NOT DETECTED Final   Parainfluenza Virus 3 NOT DETECTED NOT DETECTED Final   Parainfluenza Virus 4 NOT DETECTED NOT DETECTED Final   Respiratory Syncytial Virus NOT DETECTED NOT DETECTED Final   Bordetella pertussis NOT DETECTED NOT DETECTED Final   Chlamydophila pneumoniae NOT DETECTED NOT DETECTED Final   Mycoplasma pneumoniae NOT DETECTED NOT DETECTED Final    Comment: Performed at [facility], [place] [address] [place], [place] [address]  SARS Coronavirus 2 [facility] order, Performed in [facility] hospital lab) Nasopharyngeal Nasopharyngeal Swab     Status: None   Collection Time: 12/16/18 10:25 AM   Specimen: Nasopharyngeal Swab  Result Value Ref Range Status   SARS Coronavirus 2 NEGATIVE NEGATIVE Final    Comment: (NOTE) If result is NEGATIVE SARS-CoV-2 target nucleic acids are NOT DETECTED. The SARS-CoV-2 RNA is generally detectable in upper and lower  respiratory specimens during the acute phase of infection. The lowest  concentration of SARS-CoV-2 viral copies this assay can detect is 250  copies / mL. A negative result does not preclude SARS-CoV-2 infection  and should not be used as the sole basis for treatment or other  patient management decisions.  A negative result may occur with  improper specimen collection / handling, submission of specimen other  than nasopharyngeal swab, presence of viral  mutation(s) within the  areas targeted by this assay, and inadequate number of viral copies  (<250 copies / mL). A negative result must be combined with clinical  observations, patient history, and epidemiological information. If result is POSITIVE SARS-CoV-2 target nucleic acids are DETECTED. The SARS-CoV-2 RNA is generally detectable in upper and lower  respiratory specimens dur ing the acute phase of infection.  Positive  results are indicative of active infection with SARS-CoV-2.  Clinical  correlation with patient history and other diagnostic information is  necessary to determine patient infection status.  Positive results do  not rule out bacterial infection or co-infection with other viruses. If result is PRESUMPTIVE POSTIVE SARS-CoV-2 nucleic acids MAY BE PRESENT.   A presumptive positive result was obtained on the submitted specimen  and confirmed on repeat testing.  While 2019 novel coronavirus  (SARS-CoV-2) nucleic acids may be present in the submitted sample  additional confirmatory testing may be necessary for epidemiological  and / or clinical management purposes  to differentiate between  SARS-CoV-2 and other Sarbecovirus currently known to infect humans.  If clinically indicated additional testing with an alternate test  methodology [phone]) is advised. The SARS-CoV-2 RNA is generally  detectable in upper and lower respiratory sp ecimens during the acute  phase of infection. The expected result is Negative. Fact Sheet for Patients:  StrictlyIdeas.no Fact Sheet for Healthcare Providers: BankingDealers.co.za This test is not yet approved or cleared by the [place] FDA and has been authorized for detection and/or diagnosis of SARS-CoV-2 by FDA under an Emergency Use Authorization (EUA).  This EUA will remain in effect (meaning this test can be used) for the duration of the COVID-19 declaration under Section 564(b)(1)  of the Act, 21 U.S.C. section 360bbb-3(b)(1), unless the authorization is terminated or revoked sooner. Performed at [facility], [place] [address] [place],  [address]      Labs: BNP (last 3 results) Recent Labs    12/15/18 1453  BNP [id number]   Basic Metabolic Panel: Recent Labs  Lab 12/15/18 1453 12/15/18 1705 12/15/18 2121 12/15/18 2305 12/16/18 0225  NA 136 136  --  137 135  K 4.8 4.8  --  4.8 4.7  CL 101  --   --   --  99  CO2 24  --   --   --  24  GLUCOSE 99  --   --   --  147*  BUN 17  --   --   --  19  CREATININE 1.02*  --   --   --  0.96  CALCIUM 8.9  --   --   --  8.7*  MG  --   --  1.2*  --  1.3*  PHOS  --   --   --   --  4.0   Liver Function Tests: Recent Labs  Lab 12/16/18 0225  AST 13*  ALT 13  ALKPHOS 95  BILITOT 0.8  PROT 7.2  ALBUMIN 3.6   No results for input(s): LIPASE, AMYLASE in the last 168 hours. No results for input(s): AMMONIA in the last 168 hours. CBC: Recent Labs  Lab 12/15/18 1453 12/15/18 1705 12/15/18 2305 12/16/18 0225 12/17/18 0401 12/18/18 0354  WBC 13.4*  --   --  5.5 8.6 8.6  NEUTROABS 12.0*  --   --  4.6 6.4 5.8  HGB 14.2 15.0 14.6 13.1 12.5 13.4  HCT 43.9 44.0 43.0 40.3 37.4 40.2  MCV 95.6  --   --  94.6 92.1 92.4  PLT 359  --   --  333 343 348   Cardiac Enzymes: No results for input(s): CKTOTAL, CKMB, CKMBINDEX, TROPONINI in the last 168 hours. BNP: Invalid input(s): POCBNP CBG: Recent Labs  Lab 12/17/18 0750 12/17/18 1242 12/17/18 1640 12/17/18 2105 12/18/18 0721  GLUCAP 74 108* 127* 132* 76   D-Dimer Recent Labs    12/15/18 2121  DDIMER 0.40   Hgb A1c Recent Labs    12/16/18 0225  HGBA1C 5.7*   Lipid Profile Recent Labs    12/15/18 2235  TRIG 44   Thyroid function studies Recent Labs    12/16/18 0225  TSH 0.452   Anemia work up Recent Labs    12/15/18 2121  FERRITIN 41   Urinalysis    Component Value Date/Time   COLORURINE STRAW (A) 12/15/2018 2200    APPEARANCEUR CLEAR 12/15/2018 2200   LABSPEC 1.009 12/15/2018 2200   PHURINE 5.0 12/15/2018 2200   GLUCOSEU NEGATIVE 12/15/2018 2200   HGBUR SMALL (A) 12/15/2018 2200   BILIRUBINUR NEGATIVE 12/15/2018 2200   KETONESUR NEGATIVE 12/15/2018 2200   PROTEINUR NEGATIVE 12/15/2018 2200   UROBILINOGEN 1.0 08/05/2013 1018   NITRITE NEGATIVE 12/15/2018 2200   LEUKOCYTESUR NEGATIVE 12/15/2018 2200   Sepsis Labs Invalid input(s): PROCALCITONIN,  WBC,  LACTICIDVEN Microbiology Recent Results (from the past 240 hour(s))  Blood Culture (routine x 2)     Status: None (Preliminary result)   Collection Time: 12/15/18  4:00 PM   Specimen: BLOOD RIGHT ARM  Result Value Ref Range Status   Specimen Description BLOOD RIGHT ARM  Final   Special Requests   Final    BOTTLES DRAWN AEROBIC AND ANAEROBIC Blood Culture adequate volume   Culture   Final    NO GROWTH 2 DAYS Performed at [facility], [address] [place], [place] [address]    Report Status PENDING  Incomplete  SARS Coronavirus 2 [facility] order, Performed in [facility] hospital lab) Nasopharyngeal Nasopharyngeal Swab     Status: None   Collection Time: 12/15/18  4:35 PM  Specimen: Nasopharyngeal Swab  Result Value Ref Range Status   SARS Coronavirus 2 NEGATIVE NEGATIVE Final    Comment: (NOTE) If result is NEGATIVE SARS-CoV-2 target nucleic acids are NOT DETECTED. The SARS-CoV-2 RNA is generally detectable in upper and lower  respiratory specimens during the acute phase of infection. The lowest  concentration of SARS-CoV-2 viral copies this assay can detect is 250  copies / mL. A negative result does not preclude SARS-CoV-2 infection  and should not be used as the sole basis for treatment or other  patient management decisions.  A negative result may occur with  improper specimen collection / handling, submission of specimen other  than nasopharyngeal swab, presence of viral mutation(s) within the  areas targeted by this  assay, and inadequate number of viral copies  (<250 copies / mL). A negative result must be combined with clinical  observations, patient history, and epidemiological information. If result is POSITIVE SARS-CoV-2 target nucleic acids are DETECTED. The SARS-CoV-2 RNA is generally detectable in upper and lower  respiratory specimens dur ing the acute phase of infection.  Positive  results are indicative of active infection with SARS-CoV-2.  Clinical  correlation with patient history and other diagnostic information is  necessary to determine patient infection status.  Positive results do  not rule out bacterial infection or co-infection with other viruses. If result is PRESUMPTIVE POSTIVE SARS-CoV-2 nucleic acids MAY BE PRESENT.   A presumptive positive result was obtained on the submitted specimen  and confirmed on repeat testing.  While 2019 novel coronavirus  (SARS-CoV-2) nucleic acids may be present in the submitted sample  additional confirmatory testing may be necessary for epidemiological  and / or clinical management purposes  to differentiate between  SARS-CoV-2 and other Sarbecovirus currently known to infect humans.  If clinically indicated additional testing with an alternate test  methodology [phone]) is advised. The SARS-CoV-2 RNA is generally  detectable in upper and lower respiratory sp ecimens during the acute  phase of infection. The expected result is Negative. Fact Sheet for Patients:  StrictlyIdeas.no Fact Sheet for Healthcare Providers: BankingDealers.co.za This test is not yet approved or cleared by the [place] FDA and has been authorized for detection and/or diagnosis of SARS-CoV-2 by FDA under an Emergency Use Authorization (EUA).  This EUA will remain in effect (meaning this test can be used) for the duration of the COVID-19 declaration under Section 564(b)(1) of the Act, 21 U.S.C. section 360bbb-3(b)(1),  unless the authorization is terminated or revoked sooner. Performed at [facility], [place] [address] [place], [place] [address]   Blood Culture (routine x 2)     Status: None (Preliminary result)   Collection Time: 12/15/18  5:11 PM   Specimen: BLOOD RIGHT HAND  Result Value Ref Range Status   Specimen Description BLOOD RIGHT HAND  Final   Special Requests   Final    BOTTLES DRAWN AEROBIC AND ANAEROBIC Blood Culture adequate volume   Culture   Final    NO GROWTH 2 DAYS Performed at [facility], [place] [address] [place], [place] [address]    Report Status PENDING  Incomplete  Urine culture     Status: Abnormal   Collection Time: 12/15/18 10:08 PM   Specimen: In/Out Cath Urine  Result Value Ref Range Status   Specimen Description IN/OUT CATH URINE  Final   Special Requests   Final    NONE Performed at [facility], [place] [address] [place],  [address]  Culture MULTIPLE SPECIES PRESENT, SUGGEST RECOLLECTION (A)  Final   Report Status 12/16/2018 FINAL  Final  Respiratory Panel by PCR     Status: None   Collection Time: 12/16/18 12:53 AM   Specimen: Nasopharyngeal Swab; Respiratory  Result Value Ref Range Status   Adenovirus NOT DETECTED NOT DETECTED Final   Coronavirus 229E NOT DETECTED NOT DETECTED Final    Comment: (NOTE) The Coronavirus on the Respiratory Panel, DOES NOT test for the novel  Coronavirus (2019 nCoV)    Coronavirus HKU1 NOT DETECTED NOT DETECTED Final   Coronavirus NL63 NOT DETECTED NOT DETECTED Final   Coronavirus OC43 NOT DETECTED NOT DETECTED Final   Metapneumovirus NOT DETECTED NOT DETECTED Final   Rhinovirus / Enterovirus NOT DETECTED NOT DETECTED Final   Influenza A NOT DETECTED NOT DETECTED Final   Influenza B NOT DETECTED NOT DETECTED Final   Parainfluenza Virus 1 NOT DETECTED NOT DETECTED Final   Parainfluenza Virus 2 NOT DETECTED NOT DETECTED Final   Parainfluenza Virus 3 NOT DETECTED NOT DETECTED Final   Parainfluenza  Virus 4 NOT DETECTED NOT DETECTED Final   Respiratory Syncytial Virus NOT DETECTED NOT DETECTED Final   Bordetella pertussis NOT DETECTED NOT DETECTED Final   Chlamydophila pneumoniae NOT DETECTED NOT DETECTED Final   Mycoplasma pneumoniae NOT DETECTED NOT DETECTED Final    Comment: Performed at [facility], [place]. [address] [place],  [address]  SARS Coronavirus 2 [facility] order, Performed in [facility] hospital lab) Nasopharyngeal Nasopharyngeal Swab     Status: None   Collection Time: 12/16/18 10:25 AM   Specimen: Nasopharyngeal Swab  Result Value Ref Range Status   SARS Coronavirus 2 NEGATIVE NEGATIVE Final    Comment: (NOTE) If result is NEGATIVE SARS-CoV-2 target nucleic acids are NOT DETECTED. The SARS-CoV-2 RNA is generally detectable in upper and lower  respiratory specimens during the acute phase of infection. The lowest  concentration of SARS-CoV-2 viral copies this assay can detect is 250  copies / mL. A negative result does not preclude SARS-CoV-2 infection  and should not be used as the sole basis for treatment or other  patient management decisions.  A negative result may occur with  improper specimen collection / handling, submission of specimen other  than nasopharyngeal swab, presence of viral mutation(s) within the  areas targeted by this assay, and inadequate number of viral copies  (<250 copies / mL). A negative result must be combined with clinical  observations, patient history, and epidemiological information. If result is POSITIVE SARS-CoV-2 target nucleic acids are DETECTED. The SARS-CoV-2 RNA is generally detectable in upper and lower  respiratory specimens dur ing the acute phase of infection.  Positive  results are indicative of active infection with SARS-CoV-2.  Clinical  correlation with patient history and other diagnostic information is  necessary to determine patient infection status.  Positive results do  not rule out bacterial  infection or co-infection with other viruses. If result is PRESUMPTIVE POSTIVE SARS-CoV-2 nucleic acids MAY BE PRESENT.   A presumptive positive result was obtained on the submitted specimen  and confirmed on repeat testing.  While 2019 novel coronavirus  (SARS-CoV-2) nucleic acids may be present in the submitted sample  additional confirmatory testing may be necessary for epidemiological  and / or clinical management purposes  to differentiate between  SARS-CoV-2 and other Sarbecovirus currently known to infect humans.  If clinically indicated additional testing with an alternate test  methodology [phone]) is advised. The SARS-CoV-2 RNA is generally  detectable in upper and lower respiratory sp ecimens during the acute  phase of infection. The expected result is Negative. Fact Sheet for Patients:  StrictlyIdeas.no Fact Sheet for Healthcare Providers: BankingDealers.co.za This test is not yet approved or cleared by the [place] FDA and has been authorized for detection and/or diagnosis of SARS-CoV-2 by FDA under an Emergency Use Authorization (EUA).  This EUA will remain in effect (meaning this test can be used) for the duration of the COVID-19 declaration under Section 564(b)(1) of the Act, 21 U.S.C. section 360bbb-3(b)(1), unless the authorization is terminated or revoked sooner. Performed at [facility], [place] [address] [place], [place] [address]     Please note: You were cared for by a hospitalist during your hospital stay. Once you are discharged, your primary care physician will handle any further medical issues. Please note that NO REFILLS for any discharge medications will be authorized once you are discharged, as it is imperative that you return to your primary care physician (or establish a relationship with a primary care physician if you do not have one) for your post hospital discharge needs so that they can  reassess your need for medications and monitor your lab values.    Time coordinating discharge: 40 minutes  SIGNED:   Shelly Coss, MD  [facility] 12/18/2018, 8:28 AM Pager [phone]  If 7PM-7AM, please contact night-coverage [email or web address] Password [email or web address]

## 2018-12-19 DIAGNOSIS — R350 Frequency of micturition: Secondary | ICD-10-CM | POA: Diagnosis not present

## 2018-12-19 DIAGNOSIS — E1122 Type 2 diabetes mellitus with diabetic chronic kidney disease: Secondary | ICD-10-CM | POA: Diagnosis not present

## 2018-12-19 DIAGNOSIS — D72829 Elevated white blood cell count, unspecified: Secondary | ICD-10-CM | POA: Diagnosis not present

## 2018-12-19 DIAGNOSIS — I10 Essential (primary) hypertension: Secondary | ICD-10-CM | POA: Diagnosis not present

## 2018-12-19 DIAGNOSIS — M1990 Unspecified osteoarthritis, unspecified site: Secondary | ICD-10-CM | POA: Diagnosis not present

## 2018-12-19 DIAGNOSIS — D509 Iron deficiency anemia, unspecified: Secondary | ICD-10-CM | POA: Diagnosis not present

## 2018-12-19 DIAGNOSIS — J441 Chronic obstructive pulmonary disease with (acute) exacerbation: Secondary | ICD-10-CM | POA: Diagnosis not present

## 2018-12-19 DIAGNOSIS — N182 Chronic kidney disease, stage 2 (mild): Secondary | ICD-10-CM | POA: Diagnosis not present

## 2018-12-19 DIAGNOSIS — J9601 Acute respiratory failure with hypoxia: Secondary | ICD-10-CM | POA: Diagnosis not present

## 2018-12-19 DIAGNOSIS — R569 Unspecified convulsions: Secondary | ICD-10-CM | POA: Diagnosis not present

## 2018-12-19 DIAGNOSIS — R918 Other nonspecific abnormal finding of lung field: Secondary | ICD-10-CM | POA: Diagnosis not present

## 2018-12-19 DIAGNOSIS — K219 Gastro-esophageal reflux disease without esophagitis: Secondary | ICD-10-CM | POA: Diagnosis not present

## 2018-12-20 LAB — CULTURE, BLOOD (ROUTINE X 2)
Culture: NO GROWTH
Culture: NO GROWTH
Special Requests: ADEQUATE
Special Requests: ADEQUATE

## 2018-12-22 DIAGNOSIS — E1169 Type 2 diabetes mellitus with other specified complication: Secondary | ICD-10-CM | POA: Diagnosis not present

## 2018-12-22 DIAGNOSIS — I10 Essential (primary) hypertension: Secondary | ICD-10-CM | POA: Diagnosis not present

## 2018-12-22 DIAGNOSIS — J441 Chronic obstructive pulmonary disease with (acute) exacerbation: Secondary | ICD-10-CM | POA: Diagnosis not present

## 2018-12-23 DIAGNOSIS — E1122 Type 2 diabetes mellitus with diabetic chronic kidney disease: Secondary | ICD-10-CM | POA: Diagnosis not present

## 2018-12-23 DIAGNOSIS — D509 Iron deficiency anemia, unspecified: Secondary | ICD-10-CM | POA: Diagnosis not present

## 2018-12-23 DIAGNOSIS — R918 Other nonspecific abnormal finding of lung field: Secondary | ICD-10-CM | POA: Diagnosis not present

## 2018-12-23 DIAGNOSIS — J9601 Acute respiratory failure with hypoxia: Secondary | ICD-10-CM | POA: Diagnosis not present

## 2018-12-23 DIAGNOSIS — R350 Frequency of micturition: Secondary | ICD-10-CM | POA: Diagnosis not present

## 2018-12-23 DIAGNOSIS — J441 Chronic obstructive pulmonary disease with (acute) exacerbation: Secondary | ICD-10-CM | POA: Diagnosis not present

## 2018-12-23 DIAGNOSIS — R569 Unspecified convulsions: Secondary | ICD-10-CM | POA: Diagnosis not present

## 2018-12-23 DIAGNOSIS — M1990 Unspecified osteoarthritis, unspecified site: Secondary | ICD-10-CM | POA: Diagnosis not present

## 2018-12-23 DIAGNOSIS — I10 Essential (primary) hypertension: Secondary | ICD-10-CM | POA: Diagnosis not present

## 2018-12-23 DIAGNOSIS — K219 Gastro-esophageal reflux disease without esophagitis: Secondary | ICD-10-CM | POA: Diagnosis not present

## 2018-12-23 DIAGNOSIS — N182 Chronic kidney disease, stage 2 (mild): Secondary | ICD-10-CM | POA: Diagnosis not present

## 2018-12-23 DIAGNOSIS — D72829 Elevated white blood cell count, unspecified: Secondary | ICD-10-CM | POA: Diagnosis not present

## 2018-12-25 DIAGNOSIS — J9601 Acute respiratory failure with hypoxia: Secondary | ICD-10-CM | POA: Diagnosis not present

## 2018-12-25 DIAGNOSIS — J441 Chronic obstructive pulmonary disease with (acute) exacerbation: Secondary | ICD-10-CM | POA: Diagnosis not present

## 2018-12-25 DIAGNOSIS — R350 Frequency of micturition: Secondary | ICD-10-CM | POA: Diagnosis not present

## 2018-12-25 DIAGNOSIS — I10 Essential (primary) hypertension: Secondary | ICD-10-CM | POA: Diagnosis not present

## 2018-12-25 DIAGNOSIS — N182 Chronic kidney disease, stage 2 (mild): Secondary | ICD-10-CM | POA: Diagnosis not present

## 2018-12-25 DIAGNOSIS — R918 Other nonspecific abnormal finding of lung field: Secondary | ICD-10-CM | POA: Diagnosis not present

## 2018-12-25 DIAGNOSIS — R569 Unspecified convulsions: Secondary | ICD-10-CM | POA: Diagnosis not present

## 2018-12-25 DIAGNOSIS — K219 Gastro-esophageal reflux disease without esophagitis: Secondary | ICD-10-CM | POA: Diagnosis not present

## 2018-12-25 DIAGNOSIS — M1990 Unspecified osteoarthritis, unspecified site: Secondary | ICD-10-CM | POA: Diagnosis not present

## 2018-12-25 DIAGNOSIS — D72829 Elevated white blood cell count, unspecified: Secondary | ICD-10-CM | POA: Diagnosis not present

## 2018-12-25 DIAGNOSIS — D509 Iron deficiency anemia, unspecified: Secondary | ICD-10-CM | POA: Diagnosis not present

## 2018-12-25 DIAGNOSIS — E1122 Type 2 diabetes mellitus with diabetic chronic kidney disease: Secondary | ICD-10-CM | POA: Diagnosis not present

## 2018-12-29 DIAGNOSIS — R569 Unspecified convulsions: Secondary | ICD-10-CM | POA: Diagnosis not present

## 2018-12-29 DIAGNOSIS — R350 Frequency of micturition: Secondary | ICD-10-CM | POA: Diagnosis not present

## 2018-12-29 DIAGNOSIS — J9601 Acute respiratory failure with hypoxia: Secondary | ICD-10-CM | POA: Diagnosis not present

## 2018-12-29 DIAGNOSIS — E1122 Type 2 diabetes mellitus with diabetic chronic kidney disease: Secondary | ICD-10-CM | POA: Diagnosis not present

## 2018-12-29 DIAGNOSIS — R918 Other nonspecific abnormal finding of lung field: Secondary | ICD-10-CM | POA: Diagnosis not present

## 2018-12-29 DIAGNOSIS — N182 Chronic kidney disease, stage 2 (mild): Secondary | ICD-10-CM | POA: Diagnosis not present

## 2018-12-29 DIAGNOSIS — M1990 Unspecified osteoarthritis, unspecified site: Secondary | ICD-10-CM | POA: Diagnosis not present

## 2018-12-29 DIAGNOSIS — K219 Gastro-esophageal reflux disease without esophagitis: Secondary | ICD-10-CM | POA: Diagnosis not present

## 2018-12-29 DIAGNOSIS — J441 Chronic obstructive pulmonary disease with (acute) exacerbation: Secondary | ICD-10-CM | POA: Diagnosis not present

## 2018-12-29 DIAGNOSIS — D72829 Elevated white blood cell count, unspecified: Secondary | ICD-10-CM | POA: Diagnosis not present

## 2018-12-29 DIAGNOSIS — D509 Iron deficiency anemia, unspecified: Secondary | ICD-10-CM | POA: Diagnosis not present

## 2018-12-29 DIAGNOSIS — I10 Essential (primary) hypertension: Secondary | ICD-10-CM | POA: Diagnosis not present

## 2018-12-30 DIAGNOSIS — R918 Other nonspecific abnormal finding of lung field: Secondary | ICD-10-CM | POA: Diagnosis not present

## 2018-12-30 DIAGNOSIS — N182 Chronic kidney disease, stage 2 (mild): Secondary | ICD-10-CM | POA: Diagnosis not present

## 2018-12-30 DIAGNOSIS — J441 Chronic obstructive pulmonary disease with (acute) exacerbation: Secondary | ICD-10-CM | POA: Diagnosis not present

## 2018-12-30 DIAGNOSIS — D509 Iron deficiency anemia, unspecified: Secondary | ICD-10-CM | POA: Diagnosis not present

## 2018-12-30 DIAGNOSIS — M1990 Unspecified osteoarthritis, unspecified site: Secondary | ICD-10-CM | POA: Diagnosis not present

## 2018-12-30 DIAGNOSIS — R569 Unspecified convulsions: Secondary | ICD-10-CM | POA: Diagnosis not present

## 2018-12-30 DIAGNOSIS — R350 Frequency of micturition: Secondary | ICD-10-CM | POA: Diagnosis not present

## 2018-12-30 DIAGNOSIS — I10 Essential (primary) hypertension: Secondary | ICD-10-CM | POA: Diagnosis not present

## 2018-12-30 DIAGNOSIS — E1122 Type 2 diabetes mellitus with diabetic chronic kidney disease: Secondary | ICD-10-CM | POA: Diagnosis not present

## 2018-12-30 DIAGNOSIS — J9601 Acute respiratory failure with hypoxia: Secondary | ICD-10-CM | POA: Diagnosis not present

## 2018-12-30 DIAGNOSIS — D72829 Elevated white blood cell count, unspecified: Secondary | ICD-10-CM | POA: Diagnosis not present

## 2018-12-30 DIAGNOSIS — K219 Gastro-esophageal reflux disease without esophagitis: Secondary | ICD-10-CM | POA: Diagnosis not present

## 2018-12-31 DIAGNOSIS — K219 Gastro-esophageal reflux disease without esophagitis: Secondary | ICD-10-CM | POA: Diagnosis not present

## 2018-12-31 DIAGNOSIS — R918 Other nonspecific abnormal finding of lung field: Secondary | ICD-10-CM | POA: Diagnosis not present

## 2018-12-31 DIAGNOSIS — J441 Chronic obstructive pulmonary disease with (acute) exacerbation: Secondary | ICD-10-CM | POA: Diagnosis not present

## 2018-12-31 DIAGNOSIS — E1122 Type 2 diabetes mellitus with diabetic chronic kidney disease: Secondary | ICD-10-CM | POA: Diagnosis not present

## 2018-12-31 DIAGNOSIS — M1990 Unspecified osteoarthritis, unspecified site: Secondary | ICD-10-CM | POA: Diagnosis not present

## 2018-12-31 DIAGNOSIS — R569 Unspecified convulsions: Secondary | ICD-10-CM | POA: Diagnosis not present

## 2018-12-31 DIAGNOSIS — N182 Chronic kidney disease, stage 2 (mild): Secondary | ICD-10-CM | POA: Diagnosis not present

## 2018-12-31 DIAGNOSIS — D509 Iron deficiency anemia, unspecified: Secondary | ICD-10-CM | POA: Diagnosis not present

## 2018-12-31 DIAGNOSIS — J9601 Acute respiratory failure with hypoxia: Secondary | ICD-10-CM | POA: Diagnosis not present

## 2018-12-31 DIAGNOSIS — R350 Frequency of micturition: Secondary | ICD-10-CM | POA: Diagnosis not present

## 2018-12-31 DIAGNOSIS — I10 Essential (primary) hypertension: Secondary | ICD-10-CM | POA: Diagnosis not present

## 2018-12-31 DIAGNOSIS — D72829 Elevated white blood cell count, unspecified: Secondary | ICD-10-CM | POA: Diagnosis not present

## 2019-01-01 DIAGNOSIS — R918 Other nonspecific abnormal finding of lung field: Secondary | ICD-10-CM | POA: Diagnosis not present

## 2019-01-01 DIAGNOSIS — E1122 Type 2 diabetes mellitus with diabetic chronic kidney disease: Secondary | ICD-10-CM | POA: Diagnosis not present

## 2019-01-01 DIAGNOSIS — N182 Chronic kidney disease, stage 2 (mild): Secondary | ICD-10-CM | POA: Diagnosis not present

## 2019-01-01 DIAGNOSIS — J9601 Acute respiratory failure with hypoxia: Secondary | ICD-10-CM | POA: Diagnosis not present

## 2019-01-01 DIAGNOSIS — R569 Unspecified convulsions: Secondary | ICD-10-CM | POA: Diagnosis not present

## 2019-01-01 DIAGNOSIS — K219 Gastro-esophageal reflux disease without esophagitis: Secondary | ICD-10-CM | POA: Diagnosis not present

## 2019-01-01 DIAGNOSIS — D509 Iron deficiency anemia, unspecified: Secondary | ICD-10-CM | POA: Diagnosis not present

## 2019-01-01 DIAGNOSIS — J441 Chronic obstructive pulmonary disease with (acute) exacerbation: Secondary | ICD-10-CM | POA: Diagnosis not present

## 2019-01-01 DIAGNOSIS — D72829 Elevated white blood cell count, unspecified: Secondary | ICD-10-CM | POA: Diagnosis not present

## 2019-01-01 DIAGNOSIS — R350 Frequency of micturition: Secondary | ICD-10-CM | POA: Diagnosis not present

## 2019-01-01 DIAGNOSIS — I10 Essential (primary) hypertension: Secondary | ICD-10-CM | POA: Diagnosis not present

## 2019-01-01 DIAGNOSIS — M1990 Unspecified osteoarthritis, unspecified site: Secondary | ICD-10-CM | POA: Diagnosis not present

## 2019-01-05 DIAGNOSIS — I10 Essential (primary) hypertension: Secondary | ICD-10-CM | POA: Diagnosis not present

## 2019-01-05 DIAGNOSIS — J441 Chronic obstructive pulmonary disease with (acute) exacerbation: Secondary | ICD-10-CM | POA: Diagnosis not present

## 2019-01-06 DIAGNOSIS — K219 Gastro-esophageal reflux disease without esophagitis: Secondary | ICD-10-CM | POA: Diagnosis not present

## 2019-01-06 DIAGNOSIS — I10 Essential (primary) hypertension: Secondary | ICD-10-CM | POA: Diagnosis not present

## 2019-01-06 DIAGNOSIS — M1990 Unspecified osteoarthritis, unspecified site: Secondary | ICD-10-CM | POA: Diagnosis not present

## 2019-01-06 DIAGNOSIS — J441 Chronic obstructive pulmonary disease with (acute) exacerbation: Secondary | ICD-10-CM | POA: Diagnosis not present

## 2019-01-06 DIAGNOSIS — E1122 Type 2 diabetes mellitus with diabetic chronic kidney disease: Secondary | ICD-10-CM | POA: Diagnosis not present

## 2019-01-06 DIAGNOSIS — N182 Chronic kidney disease, stage 2 (mild): Secondary | ICD-10-CM | POA: Diagnosis not present

## 2019-01-06 DIAGNOSIS — R569 Unspecified convulsions: Secondary | ICD-10-CM | POA: Diagnosis not present

## 2019-01-06 DIAGNOSIS — D72829 Elevated white blood cell count, unspecified: Secondary | ICD-10-CM | POA: Diagnosis not present

## 2019-01-06 DIAGNOSIS — J9601 Acute respiratory failure with hypoxia: Secondary | ICD-10-CM | POA: Diagnosis not present

## 2019-01-06 DIAGNOSIS — R918 Other nonspecific abnormal finding of lung field: Secondary | ICD-10-CM | POA: Diagnosis not present

## 2019-01-06 DIAGNOSIS — D509 Iron deficiency anemia, unspecified: Secondary | ICD-10-CM | POA: Diagnosis not present

## 2019-01-06 DIAGNOSIS — R350 Frequency of micturition: Secondary | ICD-10-CM | POA: Diagnosis not present

## 2019-01-08 DIAGNOSIS — E1122 Type 2 diabetes mellitus with diabetic chronic kidney disease: Secondary | ICD-10-CM | POA: Diagnosis not present

## 2019-01-08 DIAGNOSIS — N182 Chronic kidney disease, stage 2 (mild): Secondary | ICD-10-CM | POA: Diagnosis not present

## 2019-01-08 DIAGNOSIS — R569 Unspecified convulsions: Secondary | ICD-10-CM | POA: Diagnosis not present

## 2019-01-08 DIAGNOSIS — R350 Frequency of micturition: Secondary | ICD-10-CM | POA: Diagnosis not present

## 2019-01-08 DIAGNOSIS — K219 Gastro-esophageal reflux disease without esophagitis: Secondary | ICD-10-CM | POA: Diagnosis not present

## 2019-01-08 DIAGNOSIS — J441 Chronic obstructive pulmonary disease with (acute) exacerbation: Secondary | ICD-10-CM | POA: Diagnosis not present

## 2019-01-08 DIAGNOSIS — D72829 Elevated white blood cell count, unspecified: Secondary | ICD-10-CM | POA: Diagnosis not present

## 2019-01-08 DIAGNOSIS — J9601 Acute respiratory failure with hypoxia: Secondary | ICD-10-CM | POA: Diagnosis not present

## 2019-01-08 DIAGNOSIS — R918 Other nonspecific abnormal finding of lung field: Secondary | ICD-10-CM | POA: Diagnosis not present

## 2019-01-08 DIAGNOSIS — I10 Essential (primary) hypertension: Secondary | ICD-10-CM | POA: Diagnosis not present

## 2019-01-08 DIAGNOSIS — D509 Iron deficiency anemia, unspecified: Secondary | ICD-10-CM | POA: Diagnosis not present

## 2019-01-08 DIAGNOSIS — M1990 Unspecified osteoarthritis, unspecified site: Secondary | ICD-10-CM | POA: Diagnosis not present

## 2019-01-12 DIAGNOSIS — D509 Iron deficiency anemia, unspecified: Secondary | ICD-10-CM | POA: Diagnosis not present

## 2019-01-12 DIAGNOSIS — I10 Essential (primary) hypertension: Secondary | ICD-10-CM | POA: Diagnosis not present

## 2019-01-12 DIAGNOSIS — R350 Frequency of micturition: Secondary | ICD-10-CM | POA: Diagnosis not present

## 2019-01-12 DIAGNOSIS — E1122 Type 2 diabetes mellitus with diabetic chronic kidney disease: Secondary | ICD-10-CM | POA: Diagnosis not present

## 2019-01-12 DIAGNOSIS — J9601 Acute respiratory failure with hypoxia: Secondary | ICD-10-CM | POA: Diagnosis not present

## 2019-01-12 DIAGNOSIS — M1990 Unspecified osteoarthritis, unspecified site: Secondary | ICD-10-CM | POA: Diagnosis not present

## 2019-01-12 DIAGNOSIS — R918 Other nonspecific abnormal finding of lung field: Secondary | ICD-10-CM | POA: Diagnosis not present

## 2019-01-12 DIAGNOSIS — N182 Chronic kidney disease, stage 2 (mild): Secondary | ICD-10-CM | POA: Diagnosis not present

## 2019-01-12 DIAGNOSIS — K219 Gastro-esophageal reflux disease without esophagitis: Secondary | ICD-10-CM | POA: Diagnosis not present

## 2019-01-12 DIAGNOSIS — D72829 Elevated white blood cell count, unspecified: Secondary | ICD-10-CM | POA: Diagnosis not present

## 2019-01-12 DIAGNOSIS — R569 Unspecified convulsions: Secondary | ICD-10-CM | POA: Diagnosis not present

## 2019-01-12 DIAGNOSIS — J441 Chronic obstructive pulmonary disease with (acute) exacerbation: Secondary | ICD-10-CM | POA: Diagnosis not present

## 2019-01-13 DIAGNOSIS — R350 Frequency of micturition: Secondary | ICD-10-CM | POA: Diagnosis not present

## 2019-01-13 DIAGNOSIS — E1122 Type 2 diabetes mellitus with diabetic chronic kidney disease: Secondary | ICD-10-CM | POA: Diagnosis not present

## 2019-01-13 DIAGNOSIS — R569 Unspecified convulsions: Secondary | ICD-10-CM | POA: Diagnosis not present

## 2019-01-13 DIAGNOSIS — R918 Other nonspecific abnormal finding of lung field: Secondary | ICD-10-CM | POA: Diagnosis not present

## 2019-01-13 DIAGNOSIS — J9601 Acute respiratory failure with hypoxia: Secondary | ICD-10-CM | POA: Diagnosis not present

## 2019-01-13 DIAGNOSIS — M1990 Unspecified osteoarthritis, unspecified site: Secondary | ICD-10-CM | POA: Diagnosis not present

## 2019-01-13 DIAGNOSIS — D72829 Elevated white blood cell count, unspecified: Secondary | ICD-10-CM | POA: Diagnosis not present

## 2019-01-13 DIAGNOSIS — D509 Iron deficiency anemia, unspecified: Secondary | ICD-10-CM | POA: Diagnosis not present

## 2019-01-13 DIAGNOSIS — N182 Chronic kidney disease, stage 2 (mild): Secondary | ICD-10-CM | POA: Diagnosis not present

## 2019-01-13 DIAGNOSIS — I10 Essential (primary) hypertension: Secondary | ICD-10-CM | POA: Diagnosis not present

## 2019-01-13 DIAGNOSIS — K219 Gastro-esophageal reflux disease without esophagitis: Secondary | ICD-10-CM | POA: Diagnosis not present

## 2019-01-13 DIAGNOSIS — J441 Chronic obstructive pulmonary disease with (acute) exacerbation: Secondary | ICD-10-CM | POA: Diagnosis not present

## 2019-01-16 DIAGNOSIS — D509 Iron deficiency anemia, unspecified: Secondary | ICD-10-CM | POA: Diagnosis not present

## 2019-01-16 DIAGNOSIS — J441 Chronic obstructive pulmonary disease with (acute) exacerbation: Secondary | ICD-10-CM | POA: Diagnosis not present

## 2019-01-16 DIAGNOSIS — K219 Gastro-esophageal reflux disease without esophagitis: Secondary | ICD-10-CM | POA: Diagnosis not present

## 2019-01-16 DIAGNOSIS — J9601 Acute respiratory failure with hypoxia: Secondary | ICD-10-CM | POA: Diagnosis not present

## 2019-01-16 DIAGNOSIS — N182 Chronic kidney disease, stage 2 (mild): Secondary | ICD-10-CM | POA: Diagnosis not present

## 2019-01-16 DIAGNOSIS — E1122 Type 2 diabetes mellitus with diabetic chronic kidney disease: Secondary | ICD-10-CM | POA: Diagnosis not present

## 2019-01-16 DIAGNOSIS — R350 Frequency of micturition: Secondary | ICD-10-CM | POA: Diagnosis not present

## 2019-01-16 DIAGNOSIS — R569 Unspecified convulsions: Secondary | ICD-10-CM | POA: Diagnosis not present

## 2019-01-16 DIAGNOSIS — D72829 Elevated white blood cell count, unspecified: Secondary | ICD-10-CM | POA: Diagnosis not present

## 2019-01-16 DIAGNOSIS — M1990 Unspecified osteoarthritis, unspecified site: Secondary | ICD-10-CM | POA: Diagnosis not present

## 2019-01-16 DIAGNOSIS — R918 Other nonspecific abnormal finding of lung field: Secondary | ICD-10-CM | POA: Diagnosis not present

## 2019-01-16 DIAGNOSIS — I10 Essential (primary) hypertension: Secondary | ICD-10-CM | POA: Diagnosis not present

## 2019-01-20 ENCOUNTER — Encounter (HOSPITAL_COMMUNITY): Payer: Self-pay

## 2019-01-20 ENCOUNTER — Inpatient Hospital Stay (HOSPITAL_COMMUNITY)
Admission: EM | Admit: 2019-01-20 | Discharge: 2019-01-22 | DRG: 189 | Disposition: A | Discharge status: Home / Self Care | Payer: Medicare Other | Attending: Internal Medicine | Admitting: Internal Medicine

## 2019-01-20 ENCOUNTER — Emergency Department (HOSPITAL_COMMUNITY): Payer: Medicare Other

## 2019-01-20 ENCOUNTER — Other Ambulatory Visit: Payer: Self-pay

## 2019-01-20 DIAGNOSIS — Z96653 Presence of artificial knee joint, bilateral: Secondary | ICD-10-CM | POA: Diagnosis not present

## 2019-01-20 DIAGNOSIS — Z7982 Long term (current) use of aspirin: Secondary | ICD-10-CM

## 2019-01-20 DIAGNOSIS — J9601 Acute respiratory failure with hypoxia: Secondary | ICD-10-CM | POA: Diagnosis not present

## 2019-01-20 DIAGNOSIS — F329 Major depressive disorder, single episode, unspecified: Secondary | ICD-10-CM | POA: Diagnosis present

## 2019-01-20 DIAGNOSIS — R000 Tachycardia, unspecified: Secondary | ICD-10-CM | POA: Diagnosis not present

## 2019-01-20 DIAGNOSIS — J449 Chronic obstructive pulmonary disease, unspecified: Secondary | ICD-10-CM | POA: Diagnosis present

## 2019-01-20 DIAGNOSIS — E1122 Type 2 diabetes mellitus with diabetic chronic kidney disease: Secondary | ICD-10-CM | POA: Diagnosis present

## 2019-01-20 DIAGNOSIS — R569 Unspecified convulsions: Secondary | ICD-10-CM | POA: Diagnosis not present

## 2019-01-20 DIAGNOSIS — Z20828 Contact with and (suspected) exposure to other viral communicable diseases: Secondary | ICD-10-CM | POA: Diagnosis present

## 2019-01-20 DIAGNOSIS — Z79899 Other long term (current) drug therapy: Secondary | ICD-10-CM

## 2019-01-20 DIAGNOSIS — I509 Heart failure, unspecified: Secondary | ICD-10-CM | POA: Diagnosis not present

## 2019-01-20 DIAGNOSIS — K219 Gastro-esophageal reflux disease without esophagitis: Secondary | ICD-10-CM | POA: Diagnosis present

## 2019-01-20 DIAGNOSIS — D72829 Elevated white blood cell count, unspecified: Secondary | ICD-10-CM | POA: Diagnosis not present

## 2019-01-20 DIAGNOSIS — J9621 Acute and chronic respiratory failure with hypoxia: Secondary | ICD-10-CM | POA: Diagnosis not present

## 2019-01-20 DIAGNOSIS — N182 Chronic kidney disease, stage 2 (mild): Secondary | ICD-10-CM | POA: Diagnosis not present

## 2019-01-20 DIAGNOSIS — R918 Other nonspecific abnormal finding of lung field: Secondary | ICD-10-CM | POA: Diagnosis not present

## 2019-01-20 DIAGNOSIS — F419 Anxiety disorder, unspecified: Secondary | ICD-10-CM | POA: Diagnosis present

## 2019-01-20 DIAGNOSIS — Z7984 Long term (current) use of oral hypoglycemic drugs: Secondary | ICD-10-CM | POA: Diagnosis not present

## 2019-01-20 DIAGNOSIS — J441 Chronic obstructive pulmonary disease with (acute) exacerbation: Secondary | ICD-10-CM | POA: Diagnosis not present

## 2019-01-20 DIAGNOSIS — D509 Iron deficiency anemia, unspecified: Secondary | ICD-10-CM | POA: Diagnosis not present

## 2019-01-20 DIAGNOSIS — I10 Essential (primary) hypertension: Secondary | ICD-10-CM | POA: Diagnosis not present

## 2019-01-20 DIAGNOSIS — R350 Frequency of micturition: Secondary | ICD-10-CM | POA: Diagnosis not present

## 2019-01-20 DIAGNOSIS — F17210 Nicotine dependence, cigarettes, uncomplicated: Secondary | ICD-10-CM | POA: Diagnosis present

## 2019-01-20 DIAGNOSIS — R0902 Hypoxemia: Secondary | ICD-10-CM | POA: Diagnosis not present

## 2019-01-20 DIAGNOSIS — R0602 Shortness of breath: Secondary | ICD-10-CM | POA: Diagnosis not present

## 2019-01-20 DIAGNOSIS — I130 Hypertensive heart and chronic kidney disease with heart failure and stage 1 through stage 4 chronic kidney disease, or unspecified chronic kidney disease: Secondary | ICD-10-CM | POA: Diagnosis present

## 2019-01-20 DIAGNOSIS — N181 Chronic kidney disease, stage 1: Secondary | ICD-10-CM | POA: Diagnosis present

## 2019-01-20 DIAGNOSIS — M1990 Unspecified osteoarthritis, unspecified site: Secondary | ICD-10-CM | POA: Diagnosis not present

## 2019-01-20 LAB — COMPREHENSIVE METABOLIC PANEL
ALT: 13 U/L (ref 0–44)
AST: 15 U/L (ref 15–41)
Albumin: 4 g/dL (ref 3.5–5.0)
Alkaline Phosphatase: 125 U/L (ref 38–126)
Anion gap: 12 (ref 5–15)
BUN: 15 mg/dL (ref 8–23)
CO2: 24 mmol/L (ref 22–32)
Calcium: 8.1 mg/dL — ABNORMAL LOW (ref 8.9–10.3)
Chloride: 98 mmol/L (ref 98–111)
Creatinine, Ser: 0.83 mg/dL (ref 0.44–1.00)
GFR calc Af Amer: >60 mL/min (ref >60)
GFR calc non Af Amer: >60 mL/min (ref >60)
Glucose, Bld: 107 mg/dL — ABNORMAL HIGH (ref 70–99)
Potassium: 4.2 mmol/L (ref 3.5–5.1)
Sodium: 134 mmol/L — ABNORMAL LOW (ref 135–145)
Total Bilirubin: 0.6 mg/dL (ref 0.3–1.2)
Total Protein: 8 g/dL (ref 6.5–8.1)

## 2019-01-20 LAB — CBC WITH DIFFERENTIAL/PLATELET
Abs Immature Granulocytes: 0.05 10*3/uL (ref 0.00–0.07)
Basophils Absolute: 0.1 10*3/uL (ref 0.0–0.1)
Basophils Relative: 1 %
Eosinophils Absolute: 0.1 10*3/uL (ref 0.0–0.5)
Eosinophils Relative: 1 %
HCT: 37.4 % (ref 36.0–46.0)
Hemoglobin: 11.9 g/dL — ABNORMAL LOW (ref 12.0–15.0)
Immature Granulocytes: 0 %
Lymphocytes Relative: 12 %
Lymphs Abs: 1.4 10*3/uL (ref 0.7–4.0)
MCH: 30.1 pg (ref 26.0–34.0)
MCHC: 31.8 g/dL (ref 30.0–36.0)
MCV: 94.4 fL (ref 80.0–100.0)
Monocytes Absolute: 0.6 10*3/uL (ref 0.1–1.0)
Monocytes Relative: 5 %
Neutro Abs: 10 10*3/uL — ABNORMAL HIGH (ref 1.7–7.7)
Neutrophils Relative %: 81 %
Platelets: 502 10*3/uL — ABNORMAL HIGH (ref 150–400)
RBC: 3.96 MIL/uL (ref 3.87–5.11)
RDW: 14.6 % (ref 11.5–15.5)
WBC: 12.2 10*3/uL — ABNORMAL HIGH (ref 4.0–10.5)
nRBC: 0 % (ref 0.0–0.2)

## 2019-01-20 LAB — LACTATE DEHYDROGENASE: LDH: 167 U/L (ref 98–192)

## 2019-01-20 LAB — C-REACTIVE PROTEIN: CRP: 5.3 mg/dL — ABNORMAL HIGH (ref <1.0)

## 2019-01-20 LAB — FERRITIN: Ferritin: 56 ng/mL (ref 11–307)

## 2019-01-20 LAB — D-DIMER, QUANTITATIVE: D-Dimer, Quant: 0.98 ug/mL-FEU — ABNORMAL HIGH (ref 0.00–0.50)

## 2019-01-20 LAB — TRIGLYCERIDES: Triglycerides: 69 mg/dL (ref <150)

## 2019-01-20 LAB — TROPONIN I (HIGH SENSITIVITY): Troponin I (High Sensitivity): 7 ng/L (ref <18)

## 2019-01-20 LAB — BRAIN NATRIURETIC PEPTIDE: B Natriuretic Peptide: 63 pg/mL (ref 0.0–100.0)

## 2019-01-20 LAB — FIBRINOGEN: Fibrinogen: 541 mg/dL — ABNORMAL HIGH (ref 210–475)

## 2019-01-20 LAB — PROCALCITONIN: Procalcitonin: <0.1 ng/mL

## 2019-01-20 LAB — LACTIC ACID, PLASMA: Lactic Acid, Venous: 1.1 mmol/L (ref 0.5–1.9)

## 2019-01-20 MED ORDER — FUROSEMIDE 10 MG/ML IJ SOLN
40.0000 mg | INTRAMUSCULAR | Status: AC
  Administered 2019-01-20: 16:00:00 40 mg via INTRAVENOUS
  Filled 2019-01-20: qty 4

## 2019-01-20 MED ORDER — AMLODIPINE BESYLATE 5 MG PO TABS
5.0000 mg | ORAL_TABLET | Freq: Every morning | ORAL | Status: DC
  Administered 2019-01-21 – 2019-01-22 (×2): 5 mg via ORAL
  Filled 2019-01-20 (×2): qty 1

## 2019-01-20 MED ORDER — ENOXAPARIN SODIUM 40 MG/0.4ML ~~LOC~~ SOLN
40.0000 mg | SUBCUTANEOUS | Status: DC
  Administered 2019-01-21: 40 mg via SUBCUTANEOUS
  Filled 2019-01-20 (×2): qty 0.4

## 2019-01-20 MED ORDER — CLOBETASOL PROPIONATE 0.05 % EX CREA
1.0000 "application " | TOPICAL_CREAM | Freq: Two times a day (BID) | CUTANEOUS | Status: DC | PRN
  Filled 2019-01-20: qty 15

## 2019-01-20 MED ORDER — MONTELUKAST SODIUM 10 MG PO TABS
10.0000 mg | ORAL_TABLET | Freq: Every day | ORAL | Status: DC
  Administered 2019-01-21: 10 mg via ORAL
  Filled 2019-01-20 (×2): qty 1

## 2019-01-20 MED ORDER — MOMETASONE FURO-FORMOTEROL FUM 200-5 MCG/ACT IN AERO
1.0000 | INHALATION_SPRAY | Freq: Two times a day (BID) | RESPIRATORY_TRACT | Status: DC
  Administered 2019-01-21: 1 via RESPIRATORY_TRACT
  Filled 2019-01-20: qty 8.8

## 2019-01-20 MED ORDER — AZITHROMYCIN 250 MG PO TABS
500.0000 mg | ORAL_TABLET | Freq: Once | ORAL | Status: AC
  Administered 2019-01-20: 500 mg via ORAL
  Filled 2019-01-20: qty 2

## 2019-01-20 MED ORDER — IPRATROPIUM-ALBUTEROL 0.5-2.5 (3) MG/3ML IN SOLN
3.0000 mL | Freq: Four times a day (QID) | RESPIRATORY_TRACT | Status: DC
  Administered 2019-01-21: 3 mL via RESPIRATORY_TRACT

## 2019-01-20 MED ORDER — SODIUM CHLORIDE 0.9 % IV SOLN
500.0000 mg | INTRAVENOUS | Status: DC
  Administered 2019-01-21: 500 mg via INTRAVENOUS
  Filled 2019-01-20: qty 500

## 2019-01-20 MED ORDER — ASPIRIN EC 325 MG PO TBEC
325.0000 mg | DELAYED_RELEASE_TABLET | Freq: Every morning | ORAL | Status: DC
  Administered 2019-01-21 – 2019-01-22 (×2): 325 mg via ORAL
  Filled 2019-01-20 (×2): qty 1

## 2019-01-20 MED ORDER — TRAMADOL HCL 50 MG PO TABS
50.0000 mg | ORAL_TABLET | Freq: Four times a day (QID) | ORAL | Status: DC | PRN
  Administered 2019-01-21 – 2019-01-22 (×2): 50 mg via ORAL
  Filled 2019-01-20 (×2): qty 1

## 2019-01-20 MED ORDER — SODIUM CHLORIDE 0.9 % IV SOLN
1.0000 g | Freq: Once | INTRAVENOUS | Status: AC
  Administered 2019-01-20: 18:00:00 1 g via INTRAVENOUS
  Filled 2019-01-20: qty 10

## 2019-01-20 MED ORDER — CELECOXIB 100 MG PO CAPS
200.0000 mg | ORAL_CAPSULE | Freq: Every morning | ORAL | Status: DC
  Administered 2019-01-21 – 2019-01-22 (×2): 200 mg via ORAL
  Filled 2019-01-20: qty 2
  Filled 2019-01-20 (×3): qty 1

## 2019-01-20 MED ORDER — PANTOPRAZOLE SODIUM 40 MG PO TBEC
40.0000 mg | DELAYED_RELEASE_TABLET | Freq: Every day | ORAL | Status: DC
  Administered 2019-01-21 – 2019-01-22 (×2): 40 mg via ORAL
  Filled 2019-01-20 (×2): qty 1

## 2019-01-20 MED ORDER — BUPROPION HCL ER (XL) 150 MG PO TB24
150.0000 mg | ORAL_TABLET | Freq: Every morning | ORAL | Status: DC
  Administered 2019-01-21 – 2019-01-22 (×2): 150 mg via ORAL
  Filled 2019-01-20 (×4): qty 1

## 2019-01-20 MED ORDER — SODIUM CHLORIDE 0.9 % IV SOLN
1.0000 g | INTRAVENOUS | Status: DC
  Administered 2019-01-20: 1 g via INTRAVENOUS
  Filled 2019-01-20: qty 10

## 2019-01-20 MED ORDER — INSULIN ASPART 100 UNIT/ML ~~LOC~~ SOLN
0.0000 [IU] | Freq: Three times a day (TID) | SUBCUTANEOUS | Status: DC
  Administered 2019-01-21: 1 [IU] via SUBCUTANEOUS
  Administered 2019-01-22: 12:00:00 2 [IU] via SUBCUTANEOUS

## 2019-01-20 MED ORDER — FUROSEMIDE 20 MG PO TABS
20.0000 mg | ORAL_TABLET | ORAL | Status: DC
  Administered 2019-01-21: 20 mg via ORAL
  Filled 2019-01-20: qty 1

## 2019-01-20 NOTE — H&P (Signed)
History and Physical:    Debbie Mullins   [id number] DOB: Mar 02, 1956 DOA: 01/20/2019  Referring MD/provider: Dr. Sabra Heck PCP: Lucianne Lei, MD   Patient coming from: Home  Chief Complaint: "My oxygen level was really low but I feel fine".  History of Present Illness:   Debbie Mullins is an 63 y.o. female with past medical history significant for COPD, small lung nodules, ongoing tobacco use, DM 2, HTN who was recently discharged from [facility] 1 month ago after treatment for shortness of breath and hypoxia.  CT scan showed hazy groundglass opacities throughout both lungs and her Covid test was 19 so she was treated with azithromycin and steroids for COPD exacerbation and community-acquired pneumonia.  Patient apparently felt better on discharge however continued to be hypoxic with ambulation.  Patient apparently declined home oxygen.  Patient now comes in because her oxygen level was noted to be low by home PT.  Patient states that she feels fine.  She states she feels no different than she normally does.  Patient denies any fevers or chills.  No change in rare cough.  No nausea vomiting or diarrhea.  No loss of smell or taste.  Patient does admit to anorexia however notes that is been going on for several years and that she has lost a significant amount of weight over the past 3 to 4 years.   ED Course:  The patient was noted to be afebrile and appear quite well.  Initial O2 saturation however was 76% on room air.  This corrected to 93% on 2 L.  Chest x-ray was noted to have patchy infiltrates on chest x-ray concerning for interstitial pneumonia.  Laboratory data were notable for inflammatory markers which were not markedly elevated and a normal BNP.   ROS:   ROS   Review of Systems: General: No fever, chills, weight changes Skin: No rashes, lesions, wounds Eyes: no discharge, redness, pain HENT: no ear pain, hearing loss, drainage, tinnitus Endocrine: no  heat/cold intolerance, no polyuria Cardiovascular: No palpitations, chest pain GI: No nausea, vomiting, diarrhea, constipation GU: No dysuria, increased frequency CNS: No numbness, dizziness, headache   Past Medical History:   Past Medical History:  Diagnosis Date  . Anemia   . Asthma   . CHF (congestive heart failure) (HCC)    takes Furosemide daily   . COPD (chronic obstructive pulmonary disease) (HCC)    Albuterol inhaler prn;SIngulair at night  . Depression    takes Wellbutrin daily  . Diabetes mellitus    takes Metformin daily  . GERD (gastroesophageal reflux disease)    takes Nexium daily  . History of colon polyps    benign  . Hypertension    takes Lisinopril daily  . Iron deficiency anemia 08/01/2017  . Joint pain   . Joint swelling   . Leukocytosis 02/09/2011  . Neck pain    HNP  . Normocytic anemia 02/09/2011  . Peripheral edema    takes Furosemide daily  . Pneumonia    many yrs ago  . Pulmonary nodules/lesions, multiple 08/09/2013  . Shortness of breath dyspnea    with exertion  . Thrombocytosis ([place]) 02/09/2011  . Urinary frequency   . Urinary urgency   . Weakness    numbness and tingling in both hands    Past Surgical History:   Past Surgical History:  Procedure Laterality Date  . ANTERIOR CERVICAL DECOMP/DISCECTOMY FUSION N/A 11/17/2014   Procedure: Cervical four- cervical five Anterior Cervical Decompression  with fusion and bonegraft;  Surgeon: Ashok Pall, MD;  Location: [facility];  Service: Neurosurgery;  Laterality: N/A;  C45 anterior cervical decompression with fusion plating and bonegraft  . BIOPSY  02/26/2018   Procedure: BIOPSY;  Surgeon: Danie Binder, MD;  [facility];  Service: Endoscopy;;  (colon) Gastric  duodenal  . COLONOSCOPY    . COLONOSCOPY N/A 02/26/2018   Procedure: COLONOSCOPY;  Surgeon: Danie Binder, MD;  [facility];  Service: Endoscopy;  Laterality: N/A;  9:30am  .  ESOPHAGOGASTRODUODENOSCOPY N/A 02/26/2018   Procedure: ESOPHAGOGASTRODUODENOSCOPY (EGD);  Surgeon: Danie Binder, MD;  [facility];  Service: Endoscopy;  Laterality: N/A;  . FEMUR IM NAIL Right 05/23/2016  . FEMUR IM NAIL Right 05/23/2016   Procedure: INTRAMEDULLARY (IM) RETROGRADE FEMORAL NAILING;  Surgeon: [name], MD;  Location: [facility];  Service: Orthopedics;  Laterality: Right;  . KNEE ARTHROPLASTY Left 08/10/2013   Procedure:  TOTAL KNEE ARTHROPLASTY;  Surgeon: [name], MD;  Location: [facility];  Service: Orthopedics;  Laterality: Left;  Left Total Knee Arthroplasty Revision, Cemented, Semi-constrained,   . LEFT AND RIGHT HEART CATHETERIZATION WITH CORONARY ANGIOGRAM N/A 01/07/2012   Procedure: LEFT AND RIGHT HEART CATHETERIZATION WITH CORONARY ANGIOGRAM;  Surgeon: Thayer Headings, MD;  Location: [facility] C[facility] LAB;  Service: Cardiovascular;  Laterality: N/A;  . TONSILLECTOMY     as a child  . TOTAL KNEE ARTHROPLASTY Bilateral     Social History:   Social History   Socioeconomic History  . Marital status: Single    Spouse name: Not on file  . Number of children: Not on file  . Years of education: Not on file  . Highest education level: Not on file  Occupational History  . Not on file  Social Needs  . Financial resource strain: Not on file  . Food insecurity    Worry: Not on file    Inability: Not on file  . Transportation needs    Medical: Not on file    Non-medical: Not on file  Tobacco Use  . Smoking status: Current Every Day Smoker    Packs/day: 0.50    Years: 41.00    Pack years: 20.50    Types: Cigarettes  . Smokeless tobacco: Never Used  Substance and Sexual Activity  . Alcohol use: Yes    Comment: wine  . Drug use: No  . Sexual activity: Never  Lifestyle  . Physical activity    Days per week: Not on file    Minutes per session: Not on file  . Stress: Not on file  Relationships  . Social Herbalist on phone: Not on file    Gets  together: Not on file    Attends religious service: Not on file    Active member of club or organization: Not on file    Attends meetings of clubs or organizations: Not on file    Relationship status: Not on file  . Intimate partner violence    Fear of current or ex partner: Not on file    Emotionally abused: Not on file    Physically abused: Not on file    Forced sexual activity: Not on file  Other Topics Concern  . Not on file  Social History Narrative   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 COUSINS TRANSPORTS HER.    Allergies   Patient has no known allergies.  Family  history:   Family History  Problem Relation Age of Onset  . Heart failure Mother   . Cancer Maternal Grandmother   . Colon cancer Neg Hx   . Colon polyps Neg Hx     Current Medications:   Prior to Admission medications   Medication Sig Start Date End Date Taking? Authorizing Provider  albuterol (VENTOLIN HFA) 108 (90 Base) MCG/ACT inhaler Inhale 1-2 puffs into the lungs every 4 (four) hours as needed for wheezing or shortness of breath. 12/18/18  Yes Shelly Coss, MD  amLODipine (NORVASC) 5 MG tablet Take 5 mg by mouth every morning.    Yes [provider]  aspirin EC 325 MG tablet Take 325 mg by mouth every morning.    Yes [provider]  buPROPion (WELLBUTRIN XL) 150 MG 24 hr tablet Take 150 mg by mouth every morning.    Yes [provider]  celecoxib (CELEBREX) 200 MG capsule Take 200 mg by mouth every morning.  10/01/16  Yes [provider]  Clobetasol Prop Emollient Base 0.05 % emollient cream Apply 1 application topically 2 (two) times daily as needed (itching).  08/22/18  Yes [provider]  esomeprazole (NEXIUM) 20 MG capsule Take 20 mg by mouth daily. 12/18/18  Yes [provider]  furosemide (LASIX) 20 MG tablet Take 1 tablet (20 mg total) by mouth every other day. 04/01/13  Yes Fay Records, MD  metFORMIN (GLUCOPHAGE)  500 MG tablet Take 500 mg by mouth 3 (three) times daily with meals.    Yes [provider]  montelukast (SINGULAIR) 10 MG tablet Take 10 mg by mouth at bedtime.   Yes [provider]  ZORVOLEX 35 MG CAPS Take 1 capsule by mouth 3 (three) times daily. 12/26/18  Yes [provider]  mometasone-formoterol (DULERA) 200-5 MCG/ACT AERO Inhale 1 puff into the lungs 2 (two) times daily. Patient not taking: Reported on 01/20/2019 12/18/18   Shelly Coss, MD    Physical Exam:   Vitals:   01/20/19 1500 01/20/19 1530 01/20/19 1730 01/20/19 1830  BP: (!) 183/91 140/72 131/82 131/69  Pulse: (!) 104  72 83  Resp: (!) 23 (!) 22 (!) 21 (!) 22  Temp:      TempSrc:      SpO2: (!) 87%  93% 93%  Weight:      Height:         Physical Exam: Blood pressure 131/69, pulse 83, temperature 98.8 F (37.1 C), temperature source Oral, resp. rate (!) 22, height 5\' 3"  (1.6 m), weight 78 kg, last menstrual period 07/03/2011, SpO2 93 %. Gen: Spry relatively well-appearing female looking older than stated age sitting up in stretcher in good spirits in no acute respiratory distress.  She was able to speak in full sentences without difficulty. Eyes: Sclerae anicteric. Conjunctiva mildly injected. Neck: Supple, no jugular venous distention. Chest: Decreased air entry bilaterally with some coarse breath sounds throughout.   CV: Distant, regular, no audible murmurs. Abdomen: NABS, soft, nondistended, nontender. No tenderness to light or deep palpation. No rebound, no guarding. Extremities: No edema.  Skin: Warm and dry. No rashes, lesions or wounds. Neuro: Alert and oriented times 3; grossly nonfocal. Psych: Patient is cooperative, logical and coherent with appropriate mood and affect.  Data Review:    Labs: Basic Metabolic Panel: Recent Labs  Lab 01/20/19 1506  NA 134*  K 4.2  CL 98  CO2 24  GLUCOSE 107*  BUN 15  CREATININE 0.83  CALCIUM 8.1*  Liver Function Tests: Recent  Labs  Lab 01/20/19 1506  AST 15  ALT 13  ALKPHOS 125  BILITOT 0.6  PROT 8.0  ALBUMIN 4.0   No results for input(s): LIPASE, AMYLASE in the last 168 hours. No results for input(s): AMMONIA in the last 168 hours. CBC: Recent Labs  Lab 01/20/19 1506  WBC 12.2*  NEUTROABS 10.0*  HGB 11.9*  HCT 37.4  MCV 94.4  PLT 502*   Cardiac Enzymes: No results for input(s): CKTOTAL, CKMB, CKMBINDEX, TROPONINI in the last 168 hours.  BNP (last 3 results) No results for input(s): PROBNP in the last 8760 hours. CBG: No results for input(s): GLUCAP in the last 168 hours.  Urinalysis    Component Value Date/Time   COLORURINE STRAW (A) 12/15/2018 2200   APPEARANCEUR CLEAR 12/15/2018 2200   LABSPEC 1.009 12/15/2018 2200   PHURINE 5.0 12/15/2018 2200   GLUCOSEU NEGATIVE 12/15/2018 2200   HGBUR SMALL (A) 12/15/2018 2200   BILIRUBINUR NEGATIVE 12/15/2018 2200   KETONESUR NEGATIVE 12/15/2018 2200   PROTEINUR NEGATIVE 12/15/2018 2200   UROBILINOGEN 1.0 08/05/2013 1018   NITRITE NEGATIVE 12/15/2018 2200   LEUKOCYTESUR NEGATIVE 12/15/2018 2200      Radiographic Studies: Dg Chest Port 1 View  Result Date: 01/20/2019 CLINICAL DATA:  Shortness of breath EXAM: PORTABLE CHEST 1 VIEW COMPARISON:  12/15/2018 FINDINGS: Bilateral patchy interstitial and alveolar airspace opacities. No pleural effusion or pneumothorax. Stable cardiomediastinal silhouette. No aggressive osseous lesion. IMPRESSION: Bilateral patchy interstitial and alveolar airspace opacities concerning for mild pulmonary edema versus multilobar pneumonia including atypical viral pneumonia. Electronically Signed   By: Kathreen Devoid   On: 01/20/2019 16:26    EKG: Independently reviewed.  Sinus rhythm at 100 with first-degree heart block.  Left axis deviation at -90.  No acute ST-T wave changes.   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  Patient feels well but chest x-ray does show patchy infiltrates.  Inflammatory markers are not particularly elevated however her Covid test is still pending.  HYPOXIA It is unclear to me if patient has resting hypoxia at baseline or whether she has a new pulmonary process.  Clinically patient feels well and notes no change in her symptoms at all.  Other than her O2 saturation of 76% on rest, her other laboratory data are nonrevealing.  Inflammatory markers are not particularly elevated and her BNP is within normal limits.  Her chest x-ray does show patchy infiltrates although I am not sure whether these may be a continuation of previously treated pneumonia versus fibrosis/interstitial lung disease. I have not ordered a chest CT as it may make sense to order high resolution chest CT to evaluate for interstitial lung disease--inpatient team with or without pulmonary consultation can order further work-up as warranted. Continue oxygen We will continue empiric ceftriaxone and azithromycin as started in the ED although procalcitonin is 0.10 Patient remains under airborne isolation pending Covid test.  She is being treated as a PUI. I have not started steroids on the patient given relatively low inflammatory markers and a non-negative procalcitonin.  COPD I do not hear any wheezing and patient is clinically no different from baseline. I am not starting patient on steroids because I do not believe she is in acute flare. Patient apparently does not like to take her Dulera at home however this would be beneficial to her.   I will reorder it here in hopes  that she will get used to it. Continue as needed albuterol inhaler and continue montelukast  HTN Continue amlodipine   CHF  Continue Lasix  DM2 Hold Metformin Carb controlled diet with SSI AC at bedtime Continue aspirin.  GERD Continue PPI  ANXIETY AND DEPRESSION Continue Wellbutrin  DJD Patient appears to be on both Celebrex as well as diclofenac. We will continue Celebrex, discontinue diclofenac.   Other information:   DVT prophylaxis: Lovenox ordered. Code Status: Full code. Family Communication: Patient states her family knows she is here Disposition Plan: Home Consults called: None Admission status: Inpatient  The medical decision making on this patient was of high complexity and the patient is at high risk for clinical deterioration, therefore this is a level 3 visit.   Dewaine Oats Tublu Natayla Cadenhead [facility]  If 7PM-7AM, please contact night-coverage [email or web address] Password [email or web address] 01/20/2019, 9:01 PM

## 2019-01-20 NOTE — ED Provider Notes (Signed)
[facility] EMERGENCY DEPARTMENT Provider Note   CSN: [id number] Arrival date & time: 01/20/19  1347     History   Chief Complaint Chief Complaint  Patient presents with  . Shortness of Breath    HPI Debbie Mullins is a 63 y.o. female.     HPI  This patient is a 63 year old female, she has a known history of congestive heart failure on furosemide, she is a diabetic taking Metformin and has COPD though she states she stopped smoking 2 weeks ago.  She presents to the hospital today with increasing shortness of breath.  She reports that she did only noticed this today after the physical therapist came to her house and found her oxygen to be so low that it was around 70%.  The patient was feeling more short of breath, she states that she has chronic swelling of her legs but that got slightly worse in the last couple of days as well.  Recently she was told by her family doctor that she needed to take her diuretic every other day instead of every day and has noted since that time some slight increase swelling.  Her last echocardiogram was in 2013.  According to the medical record her recent admission to the hospital from October 5 through October 8 was for COPD exacerbation.  She was given Zithromax, steroids, CT scan showed some bilateral groundglass opacities.  The patient did not follow-up with pulmonology, she does not have a cardiologist, her family doctor provides all of her care, Dr. Criss Rosales.  Paramedics report that the patient was in fact hypoxic requiring oxygen. Past Medical History:  Diagnosis Date  . Anemia   . Asthma   . CHF (congestive heart failure) (HCC)    takes Furosemide daily   . COPD (chronic obstructive pulmonary disease) (HCC)    Albuterol inhaler prn;SIngulair at night  . Depression    takes Wellbutrin daily  . Diabetes mellitus    takes Metformin daily  . GERD (gastroesophageal reflux disease)    takes Nexium daily  . History of colon polyps    benign   . Hypertension    takes Lisinopril daily  . Iron deficiency anemia 08/01/2017  . Joint pain   . Joint swelling   . Leukocytosis 02/09/2011  . Neck pain    HNP  . Normocytic anemia 02/09/2011  . Peripheral edema    takes Furosemide daily  . Pneumonia    many yrs ago  . Pulmonary nodules/lesions, multiple 08/09/2013  . Shortness of breath dyspnea    with exertion  . Thrombocytosis ([place]) 02/09/2011  . Urinary frequency   . Urinary urgency   . Weakness    numbness and tingling in both hands    Patient Active Problem List   Diagnosis Date Noted  . Acute respiratory failure with hypoxia ([place]) 12/15/2018  . Acute respiratory failure ([place]) 12/15/2018  . Iron deficiency anemia 08/01/2017  . Displaced supracondylar fracture without intracondylar extension of lower end of right femur, initial encounter for closed fracture ([place]) 05/23/2016  . 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 Closed fracture of right distal femur ([place])   . Osteoarthritis of spine with myelopathy, cervical region 11/17/2014  . 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 Pulmonary nodules/lesions, multiple 08/09/2013  . HLD (hyperlipidemia) 01/22/2012  . Pulmonary hypertension ([place]) 01/08/2012  . Acute on chronic diastolic heart failure ([place]) 01/05/2012  . Pulmonary edema, acute ([place]) 01/03/2012  . 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 HTN (hypertension) 01/02/2012  . COPD with acute exacerbation ([place]) 01/02/2012  . Tobacco abuse 07/04/2011  . 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 Leukocytosis 02/09/2011  . Mild Thrombocytosis 02/09/2011  . Obesity 02/09/2011    Past Surgical History:  Procedure Laterality Date  . ANTERIOR CERVICAL DECOMP/DISCECTOMY FUSION N/A 11/17/2014   Procedure: Cervical four- cervical five Anterior Cervical Decompression with fusion and bonegraft;  Surgeon: Ashok Pall, MD;  Location: [facility];  Service: Neurosurgery;   Laterality: N/A;  C45 anterior cervical decompression with fusion plating and bonegraft  . BIOPSY  02/26/2018   Procedure: BIOPSY;  Surgeon: Danie Binder, MD;  [facility];  Service: Endoscopy;;  (colon) Gastric  duodenal  . COLONOSCOPY    . COLONOSCOPY N/A 02/26/2018   Procedure: COLONOSCOPY;  Surgeon: Danie Binder, MD;  [facility]: [facility];  Service: Endoscopy;  Laterality: N/A;  9:30am  . ESOPHAGOGASTRODUODENOSCOPY N/A 02/26/2018   Procedure: ESOPHAGOGASTRODUODENOSCOPY (EGD);  Surgeon: Danie Binder, MD;  Location[facility];  Service: Endoscopy;  Laterality: N/A;  . FEMUR IM NAIL Right 05/23/2016  . FEMUR IM NAIL Right 05/23/2016   Procedure: INTRAMEDULLARY (IM) RETROGRADE FEMORAL NAILING;  Surgeon: [name], MD;  Location: [facility];  Service: Orthopedics;  Laterality: Right;  . KNEE ARTHROPLASTY Left 08/10/2013   Procedure:  TOTAL KNEE ARTHROPLASTY;  Surgeon: [name], MD;  Location: [facility];  Service: Orthopedics;  Laterality: Left;  Left Total Knee Arthroplasty Revision, Cemented, Semi-constrained,   . LEFT AND RIGHT HEART CATHETERIZATION WITH CORONARY ANGIOGRAM N/A 01/07/2012   Procedure: LEFT AND RIGHT HEART CATHETERIZATION WITH CORONARY ANGIOGRAM;  Surgeon: Thayer Headings, MD;  Location: [facility] CATH LAB;  Service: Cardiovascular;  Laterality: N/A;  . TONSILLECTOMY     as a child  . TOTAL KNEE ARTHROPLASTY Bilateral      OB History   No obstetric history on file.      Home Medications    Prior to Admission medications   Medication Sig Start Date End Date Taking? Authorizing Provider  albuterol (VENTOLIN HFA) 108 (90 Base) MCG/ACT inhaler Inhale 1-2 puffs into the lungs every 4 (four) hours as needed for wheezing or shortness of breath. 12/18/18   Shelly Coss, MD  amLODipine (NORVASC) 5 MG tablet Take 5 mg by mouth every morning.     [provider]  aspirin EC 325 MG tablet Take 325 mg by mouth every morning.     [provider]   buPROPion (WELLBUTRIN XL) 150 MG 24 hr tablet Take 150 mg by mouth every morning.     [provider]  celecoxib (CELEBREX) 200 MG capsule Take 200 mg by mouth every morning.  10/01/16   [provider]  Clobetasol Prop Emollient Base 0.05 % emollient cream Apply 1 application topically 2 (two) times daily as needed (itching).  08/22/18   [provider]  esomeprazole (NEXIUM) 40 MG capsule Take 40 mg by mouth daily before breakfast.     [provider]  furosemide (LASIX) 20 MG tablet Take 1 tablet (20 mg total) by mouth every other day. 04/01/13   Fay Records, MD  metFORMIN (GLUCOPHAGE) 500 MG tablet Take 500 mg by mouth 3 (three) times daily with meals.     [provider]  mometasone-formoterol (DULERA) 200-5 MCG/ACT AERO Inhale 1 puff into the lungs 2 (two) times daily. 12/18/18   Shelly Coss, MD  montelukast (SINGULAIR)  10 MG tablet Take 10 mg by mouth at bedtime.    [provider]    Family History Family History  Problem Relation Age of Onset  . Heart failure Mother   . Cancer Maternal Grandmother   . Colon cancer Neg Hx   . Colon polyps Neg Hx     Social History Social History   Tobacco Use  . Smoking status: Current Every Day Smoker    Packs/day: 0.50    Years: 41.00    Pack years: 20.50    Types: Cigarettes  . Smokeless tobacco: Never Used  Substance Use Topics  . Alcohol use: Yes    Comment: wine  . Drug use: No     Allergies   Patient has no known allergies.   Review of Systems Review of Systems  All other systems reviewed and are negative.    Physical Exam Updated Vital Signs BP (!) 180/109 (BP Location: Right Arm)   Pulse (!) 109   Temp 98.8 F (37.1 C) (Oral)   Ht 1.6 m (5\' 3" )   Wt 78 kg   LMP 07/03/2011   SpO2 90%   BMI 30.47 kg/m   Physical Exam Vitals signs and nursing note reviewed.  Constitutional:      General: She is not in acute distress.    Appearance: She is  well-developed.  HENT:     Head: Normocephalic and atraumatic.     Mouth/Throat:     Pharynx: No oropharyngeal exudate.  Eyes:     General: No scleral icterus.       Right eye: No discharge.        Left eye: No discharge.     Conjunctiva/sclera: Conjunctivae normal.     Pupils: Pupils are equal, round, and reactive to light.  Neck:     Musculoskeletal: Normal range of motion and neck supple.     Thyroid: No thyromegaly.     Vascular: No JVD.  Cardiovascular:     Rate and Rhythm: Normal rate and regular rhythm.     Heart sounds: Normal heart sounds. No murmur. No friction rub. No gallop.   Pulmonary:     Effort: Respiratory distress present.     Breath sounds: Rales present. No wheezing.  Abdominal:     General: Bowel sounds are normal. There is no distension.     Palpations: Abdomen is soft. There is no mass.     Tenderness: There is no abdominal tenderness.  Musculoskeletal: Normal range of motion.        General: No tenderness.     Right lower leg: Edema present.     Left lower leg: Edema present.  Lymphadenopathy:     Cervical: No cervical adenopathy.  Skin:    General: Skin is warm and dry.     Findings: No erythema or rash.  Neurological:     Mental Status: She is alert.     Coordination: Coordination normal.  Psychiatric:        Behavior: Behavior normal.      ED Treatments / Results  Labs (all labs ordered are listed, but only abnormal results are displayed) Labs Reviewed  CBC WITH DIFFERENTIAL/PLATELET - Abnormal; Notable for the following components:      Result Value   WBC 12.2 (*)    Hemoglobin 11.9 (*)    Platelets 502 (*)    Neutro Abs 10.0 (*)    All other components within normal limits  COMPREHENSIVE METABOLIC PANEL - Abnormal; Notable for  the following components:   Sodium 134 (*)    Glucose, Bld 107 (*)    Calcium 8.1 (*)    All other components within normal limits  CULTURE, BLOOD (ROUTINE X 2)  SARS CORONAVIRUS 2 (TAT 6-24 HRS)   CULTURE, BLOOD (ROUTINE X 2)  BRAIN NATRIURETIC PEPTIDE  LACTATE DEHYDROGENASE  LACTIC ACID, PLASMA  LACTIC ACID, PLASMA  D-DIMER, QUANTITATIVE (NOT AT [facility])  PROCALCITONIN  FERRITIN  TRIGLYCERIDES  FIBRINOGEN  C-REACTIVE PROTEIN  TROPONIN I (HIGH SENSITIVITY)    EKG EKG Interpretation  Date/Time:  Tuesday January 20 2019 14:35:52 EST Ventricular Rate:  101 PR Interval:  146 QRS Duration: 74 QT Interval:  350 QTC Calculation: 453 R Axis:   -86 Text Interpretation: Sinus tachycardia Left axis deviation Pulmonary disease pattern Abnormal ECG since last tracing no significant change Confirmed by Noemi Chapel [phone]) on 01/20/2019 3:47:11 PM   Radiology Dg Chest Port 1 View  Result Date: 01/20/2019 CLINICAL DATA:  Shortness of breath EXAM: PORTABLE CHEST 1 VIEW COMPARISON:  12/15/2018 FINDINGS: Bilateral patchy interstitial and alveolar airspace opacities. No pleural effusion or pneumothorax. Stable cardiomediastinal silhouette. No aggressive osseous lesion. IMPRESSION: Bilateral patchy interstitial and alveolar airspace opacities concerning for mild pulmonary edema versus multilobar pneumonia including atypical viral pneumonia. Electronically Signed   By: Kathreen Devoid   On: 01/20/2019 16:26    Procedures .Critical Care Performed by: Noemi Chapel, MD Authorized by: Noemi Chapel, MD   Critical care provider statement:    Critical care time (minutes):  35   Critical care time was exclusive of:  Separately billable procedures and treating other patients and teaching time   Critical care was necessary to treat or prevent imminent or life-threatening deterioration of the following conditions:  Respiratory failure   Critical care was time spent personally by me on the following activities:  Blood draw for specimens, development of treatment plan with patient or surrogate, discussions with consultants, evaluation of patient's response to treatment, examination of patient,  obtaining history from patient or surrogate, ordering and performing treatments and interventions, ordering and review of laboratory studies, ordering and review of radiographic studies, pulse oximetry, re-evaluation of patient's condition and review of old charts   (including critical care time)  Medications Ordered in ED Medications  cefTRIAXone (ROCEPHIN) 1 g in sodium chloride 0.9 % 100 mL IVPB (has no administration in time range)  azithromycin (ZITHROMAX) tablet 500 mg (has no administration in time range)  furosemide (LASIX) injection 40 mg (40 mg Intravenous Given 01/20/19 1610)     Initial Impression / Assessment and Plan / ED Course  I have reviewed the triage vital signs and the nursing notes.  Pertinent labs & imaging results that were available during my care of the patient were reviewed by me and considered in my medical decision making (see chart for details).        The patient's respiratory status is guarded, she is tachypneic, she has hypoxic to 82% on room air, she has rales in all lung fields both anterior and posterior and some peripheral edema.  She is not wheezing, she is still able to speak in full sentences, she has no chest pain or fever and has not been coughing.  I suspect that the patient has congestive heart failure.  Labs will be ordered chest x-ray and diuresis.  Patient is agreeable to the plan.  She is requiring supplemental oxygen for her hypoxia.  She has been tested negative for Covid in the past, she has  not been around anybody who has been sick with this.  The patient's chest x-ray shows multilobar pulmonary infiltrates, she has a white blood cell count of 12,200, normal troponin, normal BNP and a metabolic panel which shows no significant abnormalities.  We will obtain Covid swab, Covid labs, the patient will need to be admitted to the hospital for hypoxia.  Discussed with hospitalist will admit, appreciate Dr. Maxine Glenn  was evaluated in Emergency Department on 01/20/2019 for the symptoms described in the history of present illness. She was evaluated in the context of the global COVID-19 pandemic, which necessitated consideration that the patient might be at risk for infection with the SARS-CoV-2 virus that causes COVID-19. Institutional protocols and algorithms that pertain to the evaluation of patients at risk for COVID-19 are in a state of rapid change based on information released by regulatory bodies including the CDC and federal and state organizations. These policies and algorithms were followed during the patient's care in the ED.   Final Clinical Impressions(s) / ED Diagnoses   Final diagnoses:  Acute respiratory failure with hypoxia ([place])      Noemi Chapel, MD 01/20/19 1800

## 2019-01-20 NOTE — ED Notes (Signed)
Pt given cup of water 

## 2019-01-20 NOTE — ED Notes (Signed)
Contact numbers left by Particia Nearing [phone] or home [phone].

## 2019-01-20 NOTE — ED Triage Notes (Signed)
Pt presents to ED with complaints of SOB started today. Pt states her PT was seen at her home and seen her and states O2 sats dropped with ambulation. Home O2 had been ordered for the patient but never came. O2 per PT at rest 70-80% and with ambulation 65%. Pt denies cough and fever.

## 2019-01-20 NOTE — ED Notes (Signed)
Pt sleeping. 

## 2019-01-21 ENCOUNTER — Inpatient Hospital Stay (HOSPITAL_COMMUNITY): Payer: Medicare Other

## 2019-01-21 DIAGNOSIS — K219 Gastro-esophageal reflux disease without esophagitis: Secondary | ICD-10-CM

## 2019-01-21 LAB — BASIC METABOLIC PANEL
Anion gap: 10 (ref 5–15)
BUN: 15 mg/dL (ref 8–23)
CO2: 29 mmol/L (ref 22–32)
Calcium: 8 mg/dL — ABNORMAL LOW (ref 8.9–10.3)
Chloride: 100 mmol/L (ref 98–111)
Creatinine, Ser: 0.84 mg/dL (ref 0.44–1.00)
GFR calc Af Amer: >60 mL/min (ref >60)
GFR calc non Af Amer: >60 mL/min (ref >60)
Glucose, Bld: 107 mg/dL — ABNORMAL HIGH (ref 70–99)
Potassium: 4.1 mmol/L (ref 3.5–5.1)
Sodium: 139 mmol/L (ref 135–145)

## 2019-01-21 LAB — CBC
HCT: 36.7 % (ref 36.0–46.0)
Hemoglobin: 11.5 g/dL — ABNORMAL LOW (ref 12.0–15.0)
MCH: 29.9 pg (ref 26.0–34.0)
MCHC: 31.3 g/dL (ref 30.0–36.0)
MCV: 95.3 fL (ref 80.0–100.0)
Platelets: 450 10*3/uL — ABNORMAL HIGH (ref 150–400)
RBC: 3.85 MIL/uL — ABNORMAL LOW (ref 3.87–5.11)
RDW: 14.6 % (ref 11.5–15.5)
WBC: 8 10*3/uL (ref 4.0–10.5)
nRBC: 0 % (ref 0.0–0.2)

## 2019-01-21 LAB — CBG MONITORING, ED
Glucose-Capillary: 83 mg/dL (ref 70–99)
Glucose-Capillary: 132 mg/dL — ABNORMAL HIGH (ref 70–99)

## 2019-01-21 LAB — GLUCOSE, CAPILLARY
Glucose-Capillary: 141 mg/dL — ABNORMAL HIGH (ref 70–99)
Glucose-Capillary: 189 mg/dL — ABNORMAL HIGH (ref 70–99)

## 2019-01-21 LAB — SARS CORONAVIRUS 2 (TAT 6-24 HRS): SARS Coronavirus 2: NEGATIVE

## 2019-01-21 MED ORDER — DOXYCYCLINE HYCLATE 100 MG PO TABS
100.0000 mg | ORAL_TABLET | Freq: Two times a day (BID) | ORAL | Status: DC
  Administered 2019-01-21 – 2019-01-22 (×2): 100 mg via ORAL
  Filled 2019-01-21 (×2): qty 1

## 2019-01-21 MED ORDER — IPRATROPIUM-ALBUTEROL 0.5-2.5 (3) MG/3ML IN SOLN
3.0000 mL | Freq: Three times a day (TID) | RESPIRATORY_TRACT | Status: DC
  Administered 2019-01-22 (×2): 3 mL via RESPIRATORY_TRACT
  Filled 2019-01-21 (×2): qty 3

## 2019-01-21 MED ORDER — PREDNISONE 20 MG PO TABS
40.0000 mg | ORAL_TABLET | Freq: Every day | ORAL | Status: DC
  Administered 2019-01-22: 40 mg via ORAL
  Filled 2019-01-21: qty 2

## 2019-01-21 MED ORDER — MOMETASONE FURO-FORMOTEROL FUM 200-5 MCG/ACT IN AERO
1.0000 | INHALATION_SPRAY | Freq: Two times a day (BID) | RESPIRATORY_TRACT | Status: DC
  Administered 2019-01-22: 09:00:00 1 via RESPIRATORY_TRACT
  Filled 2019-01-21: qty 8.8

## 2019-01-21 MED ORDER — HYDROCORTISONE 1 % EX CREA
TOPICAL_CREAM | Freq: Two times a day (BID) | CUTANEOUS | Status: DC | PRN
  Filled 2019-01-21: qty 28

## 2019-01-21 NOTE — ED Notes (Signed)
Given breakfast tray. 

## 2019-01-21 NOTE — ED Notes (Signed)
Pt called out and said her bed was damp; went in to check on pt, pt asleep and bed dry; pt ate graham crackers and jello

## 2019-01-21 NOTE — Progress Notes (Signed)
PROGRESS NOTE    Debbie Mullins  [id number] DOB: Sep 19, 1955 DOA: 01/20/2019 PCP: Lucianne Lei, MD     Brief Narrative:  63 y.o. female with past medical history significant for COPD, small lung nodules, ongoing tobacco use, DM 2, HTN who was recently discharged from [facility] 1 month ago after treatment for shortness of breath and hypoxia.  CT scan showed hazy groundglass opacities throughout both lungs and her Covid test was 19 so she was treated with azithromycin and steroids for COPD exacerbation and community-acquired pneumonia.  Patient apparently felt better on discharge however continued to be hypoxic with ambulation.  Patient apparently declined home oxygen.  Patient now comes in because her oxygen level was noted to be low by home PT.  Patient states that she feels fine.  She states she feels no different than she normally does.  Patient denies any fevers or chills.  No change in rare cough.  No nausea vomiting or diarrhea.  No loss of smell or taste.  Patient does admit to anorexia however notes that is been going on for several years and that she has lost a significant amount of weight over the past 3 to 4 years.   ED Course:  The patient was noted to be afebrile and appear quite well.  Initial O2 saturation however was 76% on room air.  This corrected to 93% on 2 L.  Chest x-ray was noted to have patchy infiltrates on chest x-ray concerning for interstitial pneumonia.  Laboratory data were notable for inflammatory markers which were not markedly elevated and a normal BNP.   Assessment & Plan: 1-acute on chronic respiratory failure with hypoxia ([place]) -Patient declined oxygen use in the past; but will certainly qualify by requirement. -Currently stable while receiving oxygen supplementation. -No having any fevers or active wheezing -CT scan high-resolution demonstrating concern for chronic hypersensitivity pneumonitis. -Patient will be started on steroids,  antibiotics transition to oral regimen using doxycycline to cover any potential underlying bronchitis. -Pulmonology service has been consulted and will follow any further recommendations. -Continue oxygen supplementation.  2-COPD (chronic obstructive pulmonary disease) (HCC) -No active wheezing -Continue home bronchodilator regimen.  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 -Follow blood sugar level.  4-HTN (hypertension) -Continue current antihypertensive regimen.  5-gastroesophageal flux disease) -continue PPI.   DVT prophylaxis: Lovenox Code Status: Full code Family Communication: No family at bedside. Disposition Plan: Remains inpatient,started on oral prednisone following results of high-resolution CT scan of her chest; transition to oral antibiotics and follow recommendation for pulmonology service.  Consultants:   Pulmonologist.  Procedures:   See below for x-ray reports  Antimicrobials:  Anti-infectives (From admission, onward)   Start     Dose/Rate Route Frequency Ordered Stop   01/21/19 2200  doxycycline (VIBRA-TABS) tablet 100 mg     100 mg Oral Every 12 hours 01/21/19 1822     01/20/19 2300  cefTRIAXone (ROCEPHIN) 1 g in sodium chloride 0.9 % 100 mL IVPB  Status:  Discontinued     1 g 200 mL/hr over 30 Minutes Intravenous Every 24 hours 01/20/19 2249 01/21/19 1822   01/20/19 2300  azithromycin (ZITHROMAX) 500 mg in sodium chloride 0.9 % 250 mL IVPB  Status:  Discontinued     500 mg 250 mL/hr over 60 Minutes Intravenous Every 24 hours 01/20/19 2249 01/21/19 1822   01/20/19 1715  cefTRIAXone (ROCEPHIN) 1 g in sodium  chloride 0.9 % 100 mL IVPB     1 g 200 mL/hr over 30 Minutes Intravenous  Once 01/20/19 1706 01/20/19 2209   01/20/19 1715  azithromycin (ZITHROMAX) tablet 500 mg     500 mg Oral  Once 01/20/19 1706 01/20/19 1813       Subjective: No fever, no chest  pain, no nausea, no vomiting.  Still short of breath on exertion and requiring oxygen supplementation.  Objective: Vitals:   01/21/19 1330 01/21/19 1345 01/21/19 1400 01/21/19 1508  BP: 114/67   (!) 143/79  Pulse: 71  61   Resp: 18 20 18 18   Temp:      TempSrc:      SpO2: 98%  100% 99%  Weight:      Height:        Intake/Output Summary (Last 24 hours) at 01/21/2019 1822 Last data filed at 01/21/2019 0358 Gross per 24 hour  Intake 450 ml  Output 1900 ml  Net -1450 ml   Filed Weights   01/20/19 1430  Weight: 78 kg    Examination: General exam: Alert, awake, oriented x 3; currently afebrile, denies chest pain, no nausea, no vomiting.  Still short of breath mainly on exertion and with active desaturation/hypoxia without supplementation. Respiratory system: Fair air movement bilaterally, no active wheezing caudally; diffuse rhonchi are appreciated bilaterally. Cardiovascular system:RRR. No rubs or gallops.  No JVD. Gastrointestinal system: Abdomen is nondistended, soft and nontender. No organomegaly or masses felt. Normal bowel sounds heard. Central nervous system: Alert and oriented. No focal neurological deficits. Extremities: No cyanosis or clubbing. Skin: No rashes, no petechiae. Psychiatry: Judgement and insight appear normal. Mood & affect appropriate.     Data Reviewed: I have personally reviewed following labs and imaging studies  CBC: Recent Labs  Lab 01/20/19 1506 01/21/19 0416  WBC 12.2* 8.0  NEUTROABS 10.0*  --   HGB 11.9* 11.5*  HCT 37.4 36.7  MCV 94.4 95.3  PLT 502* [id number]*   Basic Metabolic Panel: Recent Labs  Lab 01/20/19 1506 01/21/19 0416  NA 134* 139  K 4.2 4.1  CL 98 100  CO2 24 29  GLUCOSE 107* 107*  BUN 15 15  CREATININE 0.83 0.84  CALCIUM 8.1* 8.0*   GFR: Estimated Creatinine Clearance: 69.5 mL/min (by C-G formula based on SCr of 0.84 mg/dL).   Liver Function Tests: Recent Labs  Lab 01/20/19 1506  AST 15  ALT 13  ALKPHOS 125   BILITOT 0.6  PROT 8.0  ALBUMIN 4.0   CBG: Recent Labs  Lab 01/21/19 0747 01/21/19 1237 01/21/19 1537  GLUCAP 83 132* 141*   Lipid Profile: Recent Labs    01/20/19 1643  TRIG 69   Anemia Panel: Recent Labs    01/20/19 1643  FERRITIN 56   Urine analysis:    Component Value Date/Time   COLORURINE STRAW (A) 12/15/2018 2200   APPEARANCEUR CLEAR 12/15/2018 2200   LABSPEC 1.009 12/15/2018 2200   PHURINE 5.0 12/15/2018 2200   GLUCOSEU NEGATIVE 12/15/2018 2200   HGBUR SMALL (A) 12/15/2018 2200   BILIRUBINUR NEGATIVE 12/15/2018 2200   KETONESUR NEGATIVE 12/15/2018 2200   PROTEINUR NEGATIVE 12/15/2018 2200   UROBILINOGEN 1.0 08/05/2013 1018   NITRITE NEGATIVE 12/15/2018 2200   LEUKOCYTESUR NEGATIVE 12/15/2018 2200    Recent Results (from the past 240 hour(s))  SARS CORONAVIRUS 2 (TAT 6-24 HRS) Nasopharyngeal Nasopharyngeal Swab     Status: None   Collection Time: 01/20/19  4:37 PM   Specimen: Nasopharyngeal Swab  Result Value Ref Range Status   SARS Coronavirus 2 NEGATIVE NEGATIVE Final    Comment: (NOTE) SARS-CoV-2 target nucleic acids are NOT DETECTED. The SARS-CoV-2 RNA is generally detectable in upper and lower respiratory specimens during the acute phase of infection. Negative results do not preclude SARS-CoV-2 infection, do not rule out co-infections with other pathogens, and should not be used as the sole basis for treatment or other patient management decisions. Negative results must be combined with clinical observations, patient history, and epidemiological information. The expected result is Negative. Fact Sheet for Patients: SugarRoll.be Fact Sheet for Healthcare Providers: https://www.woods-mathews.com/ This test is not yet approved or cleared by the [place] FDA and  has been authorized for detection and/or diagnosis of SARS-CoV-2 by FDA under an Emergency Use Authorization (EUA). This EUA will remain   in effect (meaning this test can be used) for the duration of the COVID-19 declaration under Section 56 4(b)(1) of the Act, 21 U.S.C. section 360bbb-3(b)(1), unless the authorization is terminated or revoked sooner. Performed at [facility], [place] [address], [place], [place] [address]   Blood Culture (routine x 2)     Status: None (Preliminary result)   Collection Time: 01/20/19  4:53 PM   Specimen: Right Antecubital; Blood  Result Value Ref Range Status   Specimen Description RIGHT ANTECUBITAL  Final   Special Requests   Final    BOTTLES DRAWN AEROBIC AND ANAEROBIC Blood Culture adequate volume   Culture   Final    NO GROWTH < 24 HOURS Performed at [facility], [address], [place], [place] [address]    Report Status PENDING  Incomplete  Blood Culture (routine x 2)     Status: None (Preliminary result)   Collection Time: 01/20/19  4:54 PM   Specimen: BLOOD RIGHT HAND  Result Value Ref Range Status   Specimen Description BLOOD RIGHT HAND  Final   Special Requests   Final    BOTTLES DRAWN AEROBIC AND ANAEROBIC Blood Culture adequate volume   Culture   Final    NO GROWTH < 24 HOURS Performed at [facility], [address], [place], [place] [address]    Report Status PENDING  Incomplete     Radiology Studies: Ct Chest High Resolution  Result Date: 01/21/2019 CLINICAL DATA:  Increased shortness of breath for 2 weeks.  Smoker. EXAM: CT CHEST WITHOUT CONTRAST TECHNIQUE: Multidetector CT imaging of the chest was performed following the standard protocol without intravenous contrast. High resolution imaging of the lungs, as well as inspiratory and expiratory imaging, was performed. COMPARISON:  10/24/2018, 10/23/2017 and 07/09/2012. FINDINGS: Cardiovascular: Atherosclerotic calcification of the aorta, aortic valve and coronary arteries. Pulmonic trunk is enlarged. Heart is at the upper limits of normal in size. No pericardial effusion. Mediastinum/Nodes: Mediastinal lymph  nodes are not enlarged by CT size criteria. Hilar regions are difficult to evaluate without IV contrast. No axillary adenopathy. Esophagus is grossly unremarkable. Lungs/Pleura: Mosaic pulmonary parenchymal attenuation with upper and midlung zone predominant reticulonodularity. Nodularity is seen along the fissures. Findings appear chronic when compared with exams dating as far back as 07/09/2012. Mild associated bronchiectasis. Pulmonary nodules measure up to 5 mm in the right middle lobe, are unchanged from 07/09/2012 and considered benign. Volume loss in the medial left lower lobe has increased from 12/15/2018. There is air trapping. No pleural fluid. Airway is otherwise unremarkable. Upper Abdomen: Visualized portions of the liver, spleen, stomach and bowel are grossly unremarkable. Musculoskeletal: Degenerative changes in the spine and  right shoulder. Posterior osteophytosis in the mid and lower thoracic spine extends into the spinal canal. No worrisome lytic or sclerotic lesions. IMPRESSION: 1. Pulmonary parenchymal pattern of reticulonodularity and air trapping appears similar to prior exams. Differential diagnosis includes chronic hypersensitivity pneumonitis and sarcoid. Findings are suggestive of an alternative diagnosis (not UIP) per consensus guidelines: Diagnosis of Idiopathic Pulmonary Fibrosis: An Official ATS/ERS/JRS/ALAT Clinical Practice Guideline. [place], Iss 5, [phone], Nov 10 2016. 2. Increasing volume loss in the infrahilar and medial left lower lobe, difficult to exclude a component of pneumonia. 3. Aortic atherosclerosis (ICD10-170.0). Coronary artery calcification. 4. Enlarged pulmonic trunk, indicative of pulmonary arterial hypertension. Electronically Signed   By: [name] M.D.   On: 01/21/2019 12:22   Dg Chest Port 1 View  Result Date: 01/20/2019 CLINICAL DATA:  Shortness of breath EXAM: PORTABLE CHEST 1 VIEW COMPARISON:  12/15/2018 FINDINGS:  Bilateral patchy interstitial and alveolar airspace opacities. No pleural effusion or pneumothorax. Stable cardiomediastinal silhouette. No aggressive osseous lesion. IMPRESSION: Bilateral patchy interstitial and alveolar airspace opacities concerning for mild pulmonary edema versus multilobar pneumonia including atypical viral pneumonia. Electronically Signed   By: Kathreen Devoid   On: 01/20/2019 16:26    Scheduled Meds: . amLODipine  5 mg Oral q morning - 10a  . aspirin EC  325 mg Oral q morning - 10a  . buPROPion  150 mg Oral q morning - 10a  . celecoxib  200 mg Oral q morning - 10a  . doxycycline  100 mg Oral Q12H  . enoxaparin (LOVENOX) injection  40 mg Subcutaneous Q24H  . furosemide  20 mg Oral QODAY  . insulin aspart  0-9 Units Subcutaneous TID WC  . ipratropium-albuterol  3 mL Nebulization Q6H  . mometasone-formoterol  1 puff Inhalation BID  . montelukast  10 mg Oral QHS  . pantoprazole  40 mg Oral Daily   Continuous Infusions:   LOS: 1 day    Time spent: 30 minutes.    Barton Dubois, MD [facility] Pager [phone]   01/21/2019, 6:22 PM

## 2019-01-21 NOTE — ED Notes (Signed)
SATURATION QUALIFICATIONS: (This note is used to comply with regulatory documentation for home oxygen)  Patient Saturations on Room Air at Rest = 80%  Patient Saturations on 6 Liters of oxygen while Ambulating = 90%

## 2019-01-21 NOTE — ED Notes (Signed)
Pt given cranberry juice and warm blanket

## 2019-01-22 DIAGNOSIS — R918 Other nonspecific abnormal finding of lung field: Secondary | ICD-10-CM

## 2019-01-22 LAB — GLUCOSE, CAPILLARY
Glucose-Capillary: 87 mg/dL (ref 70–99)
Glucose-Capillary: 161 mg/dL — ABNORMAL HIGH (ref 70–99)

## 2019-01-22 MED ORDER — DOXYCYCLINE HYCLATE 100 MG PO TABS: 100.0000 mg | ORAL_TABLET | Freq: Two times a day (BID) | ORAL | 0 refills | Status: AC

## 2019-01-22 MED ORDER — METFORMIN HCL 500 MG PO TABS: 750.0000 mg | ORAL_TABLET | Freq: Two times a day (BID) | ORAL | Status: DC

## 2019-01-22 MED ORDER — PREDNISONE 10 MG PO TABS: ORAL_TABLET | ORAL | 0 refills | Status: DC

## 2019-01-22 NOTE — Consult Note (Signed)
Consult requested by: [facility] hospitalist, Dr. Dyann Kief Consult requested for: Respiratory failure, abnormal chest CT  HPI: This is a 63 year old has past medical history of COPD ongoing tobacco use diabetes hypertension and who had been discharged from [facility] about a month ago after treatment for shortness of breath and hypoxia.  She was treated with azithromycin and steroids and improved.  She was felt to be a candidate for oxygen but declined.  She came back to the hospital with increasing shortness of breath hypoxia.  She says that she does cough some.  She says she feels well.  High-resolution CT shows groundglass opacities and is suggestive that she could have chronic hypersensitivity pneumonitis.  She does not have any of the typical exposures.  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  Of note there does not seem to be much change in the CT since 2014.  I have personally reviewed the scans.  She says she feels well and wants to know about going home.  She is not having any chest pain.  Not producing any sputum.  No loss of smell or taste.  No nausea or vomiting.  She says she has stopped smoking for 2 weeks  Past Medical History:  Diagnosis Date  . Anemia   . Asthma   . CHF (congestive heart failure) (HCC)    takes Furosemide daily   . COPD (chronic obstructive pulmonary disease) (HCC)    Albuterol inhaler prn;SIngulair at night  . Depression    takes Wellbutrin daily  . Diabetes mellitus    takes Metformin daily  . GERD (gastroesophageal reflux disease)    takes Nexium daily  . History of colon polyps    benign  . Hypertension    takes Lisinopril daily  . Iron deficiency anemia 08/01/2017  . Joint pain   . Joint swelling   . Leukocytosis 02/09/2011  . Neck pain    HNP  . Normocytic anemia 02/09/2011  . Peripheral edema    takes Furosemide daily  . Pneumonia    many yrs ago  . Pulmonary nodules/lesions, multiple 08/09/2013  . Shortness of  breath dyspnea    with exertion  . Thrombocytosis ([place]) 02/09/2011  . Urinary frequency   . Urinary urgency   . Weakness    numbness and tingling in both hands     Family History  Problem Relation Age of Onset  . Heart failure Mother   . Cancer Maternal Grandmother   . Colon cancer Neg Hx   . Colon polyps Neg Hx      Social History   Socioeconomic History  . Marital status: Single    Spouse name: Not on file  . Number of children: Not on file  . Years of education: Not on file  . Highest education level: Not on file  Occupational History  . Not on file  Social Needs  . Financial resource strain: Not on file  . Food insecurity    Worry: Not on file    Inability: Not on file  . Transportation needs    Medical: Not on file    Non-medical: Not on file  Tobacco Use  . Smoking status: Current Every Day Smoker    Packs/day: 0.50    Years: 41.00    Pack years: 20.50    Types: Cigarettes  . Smokeless tobacco: Never Used  Substance and Sexual Activity  . Alcohol use: Yes    Comment: wine  .  Drug use: No  . Sexual activity: Never  Lifestyle  . Physical activity    Days per week: Not on file    Minutes per session: Not on file  . Stress: Not on file  Relationships  . Social Herbalist on phone: Not on file    Gets together: Not on file    Attends religious service: Not on file    Active member of club or organization: Not on file    Attends meetings of clubs or organizations: Not on file    Relationship status: Not on file  Other Topics Concern  . Not on file  Social History Narrative   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 COUSINS TRANSPORTS HER.     ROS: Except as mentioned 10 point review of systems is negative    Objective: Vital signs in last 24 hours: Temp:  [97.7 F (36.5 C)-97.9 F (36.6 C)] 97.9 F (36.6 C) (11/12 0540) Pulse Rate:  [59-85] 68 (11/12 0540) Resp:  [14-27] 16 (11/12 0540) BP:  (114-146)/(62-81) 140/62 (11/12 0540) SpO2:  [91 %-100 %] 100 % (11/12 0540) Weight:  [75.6 kg] 75.6 kg (11/11 2235) Weight change: -2.449 kg Last BM Date: 01/21/19  Intake/Output from previous day: 11/11 0701 - 11/12 0700 In: 360 [P.O.:360] Out: 650 [Urine:650]  PHYSICAL EXAM Constitutional: She is awake and alert no acute distress.  Cardiovascular: Her heart is regular with normal heart sounds.  Respiratory: She is wearing nasal oxygen.  Respiratory effort is normal.  Her lungs show some rales.  Gastrointestinal: Her abdomen soft with no masses.  Musculoskeletal: Grossly normal strength bilaterally.  Neurological: No focal abnormalities.  Psychiatric: She is a little bit anxious.  Eyes: Pupils react ears nose mouth and throat: Mucous membranes are moist and her hearing is grossly normal  Lab Results: Basic Metabolic Panel: Recent Labs    01/20/19 1506 01/21/19 0416  NA 134* 139  K 4.2 4.1  CL 98 100  CO2 24 29  GLUCOSE 107* 107*  BUN 15 15  CREATININE 0.83 0.84  CALCIUM 8.1* 8.0*   Liver Function Tests: Recent Labs    01/20/19 1506  AST 15  ALT 13  ALKPHOS 125  BILITOT 0.6  PROT 8.0  ALBUMIN 4.0   No results for input(s): LIPASE, AMYLASE in the last 72 hours. No results for input(s): AMMONIA in the last 72 hours. CBC: Recent Labs    01/20/19 1506 01/21/19 0416  WBC 12.2* 8.0  NEUTROABS 10.0*  --   HGB 11.9* 11.5*  HCT 37.4 36.7  MCV 94.4 95.3  PLT 502* 450*   Cardiac Enzymes: No results for input(s): CKTOTAL, CKMB, CKMBINDEX, TROPONINI in the last 72 hours. BNP: No results for input(s): PROBNP in the last 72 hours. D-Dimer: Recent Labs    01/20/19 1643  DDIMER 0.98*   CBG: Recent Labs    01/21/19 0747 01/21/19 1237 01/21/19 1537 01/21/19 2035 01/22/19 0735  GLUCAP 83 132* 141* 189* 87   Hemoglobin A1C: No results for input(s): HGBA1C in the last 72 hours. Fasting Lipid Panel: Recent Labs    01/20/19 1643  TRIG 69   Thyroid Function  Tests: No results for input(s): TSH, T4TOTAL, FREET4, T3FREE, THYROIDAB in the last 72 hours. Anemia Panel: Recent Labs    01/20/19 1643  FERRITIN 56   Coagulation: No results for input(s): LABPROT, INR in the last 72 hours. Urine Drug Screen: Drugs of Abuse  No results  found for: LABOPIA, COCAINSCRNUR, LABBENZ, AMPHETMU, THCU, LABBARB  Alcohol Level: No results for input(s): ETH in the last 72 hours. Urinalysis: No results for input(s): COLORURINE, LABSPEC, PHURINE, GLUCOSEU, HGBUR, BILIRUBINUR, KETONESUR, PROTEINUR, UROBILINOGEN, NITRITE, LEUKOCYTESUR in the last 72 hours.  Invalid input(s): APPERANCEUR Misc. Labs:   ABGS: No results for input(s): PHART, PO2ART, TCO2, HCO3 in the last 72 hours.  Invalid input(s): PCO2   MICROBIOLOGY: Recent Results (from the past 240 hour(s))  SARS CORONAVIRUS 2 (TAT 6-24 HRS) Nasopharyngeal Nasopharyngeal Swab     Status: None   Collection Time: 01/20/19  4:37 PM   Specimen: Nasopharyngeal Swab  Result Value Ref Range Status   SARS Coronavirus 2 NEGATIVE NEGATIVE Final    Comment: (NOTE) SARS-CoV-2 target nucleic acids are NOT DETECTED. The SARS-CoV-2 RNA is generally detectable in upper and lower respiratory specimens during the acute phase of infection. Negative results do not preclude SARS-CoV-2 infection, do not rule out co-infections with other pathogens, and should not be used as the sole basis for treatment or other patient management decisions. Negative results must be combined with clinical observations, patient history, and epidemiological information. The expected result is Negative. Fact Sheet for Patients: SugarRoll.be Fact Sheet for Healthcare Providers: https://www.woods-mathews.com/ This test is not yet approved or cleared by the [place] FDA and  has been authorized for detection and/or diagnosis of SARS-CoV-2 by FDA under an Emergency Use Authorization (EUA). This EUA  will remain  in effect (meaning this test can be used) for the duration of the COVID-19 declaration under Section 56 4(b)(1) of the Act, 21 U.S.C. section 360bbb-3(b)(1), unless the authorization is terminated or revoked sooner. Performed at [facility], [place] [address], [place], [place] [address]   Blood Culture (routine x 2)     Status: None (Preliminary result)   Collection Time: 01/20/19  4:53 PM   Specimen: Right Antecubital; Blood  Result Value Ref Range Status   Specimen Description RIGHT ANTECUBITAL  Final   Special Requests   Final    BOTTLES DRAWN AEROBIC AND ANAEROBIC Blood Culture adequate volume   Culture   Final    NO GROWTH 2 DAYS Performed at [facility], [address] [place], [place] [address]    Report Status PENDING  Incomplete  Blood Culture (routine x 2)     Status: None (Preliminary result)   Collection Time: 01/20/19  4:54 PM   Specimen: BLOOD RIGHT HAND  Result Value Ref Range Status   Specimen Description BLOOD RIGHT HAND  Final   Special Requests   Final    BOTTLES DRAWN AEROBIC AND ANAEROBIC Blood Culture adequate volume   Culture   Final    NO GROWTH 2 DAYS Performed at [facility], [address], [place], [place] [address]    Report Status PENDING  Incomplete    Studies/Results: Ct Chest High Resolution  Result Date: 01/21/2019 CLINICAL DATA:  Increased shortness of breath for 2 weeks.  Smoker. EXAM: CT CHEST WITHOUT CONTRAST TECHNIQUE: Multidetector CT imaging of the chest was performed following the standard protocol without intravenous contrast. High resolution imaging of the lungs, as well as inspiratory and expiratory imaging, was performed. COMPARISON:  10/24/2018, 10/23/2017 and 07/09/2012. FINDINGS: Cardiovascular: Atherosclerotic calcification of the aorta, aortic valve and coronary arteries. Pulmonic trunk is enlarged. Heart is at the upper limits of normal in size. No pericardial effusion. Mediastinum/Nodes: Mediastinal lymph  nodes are not enlarged by CT size criteria. Hilar regions are difficult to evaluate without IV contrast.  No axillary adenopathy. Esophagus is grossly unremarkable. Lungs/Pleura: Mosaic pulmonary parenchymal attenuation with upper and midlung zone predominant reticulonodularity. Nodularity is seen along the fissures. Findings appear chronic when compared with exams dating as far back as 07/09/2012. Mild associated bronchiectasis. Pulmonary nodules measure up to 5 mm in the right middle lobe, are unchanged from 07/09/2012 and considered benign. Volume loss in the medial left lower lobe has increased from 12/15/2018. There is air trapping. No pleural fluid. Airway is otherwise unremarkable. Upper Abdomen: Visualized portions of the liver, spleen, stomach and bowel are grossly unremarkable. Musculoskeletal: Degenerative changes in the spine and right shoulder. Posterior osteophytosis in the mid and lower thoracic spine extends into the spinal canal. No worrisome lytic or sclerotic lesions. IMPRESSION: 1. Pulmonary parenchymal pattern of reticulonodularity and air trapping appears similar to prior exams. Differential diagnosis includes chronic hypersensitivity pneumonitis and sarcoid. Findings are suggestive of an alternative diagnosis (not UIP) per consensus guidelines: Diagnosis of Idiopathic Pulmonary Fibrosis: An Official ATS/ERS/JRS/ALAT Clinical Practice Guideline. [place], Iss 5, [phone], Nov 10 2016. 2. Increasing volume loss in the infrahilar and medial left lower lobe, difficult to exclude a component of pneumonia. 3. Aortic atherosclerosis (ICD10-170.0). Coronary artery calcification. 4. Enlarged pulmonic trunk, indicative of pulmonary arterial hypertension. Electronically Signed   By: [name] M.D.   On: 01/21/2019 12:22   Dg Chest Port 1 View  Result Date: 01/20/2019 CLINICAL DATA:  Shortness of breath EXAM: PORTABLE CHEST 1 VIEW COMPARISON:  12/15/2018 FINDINGS:  Bilateral patchy interstitial and alveolar airspace opacities. No pleural effusion or pneumothorax. Stable cardiomediastinal silhouette. No aggressive osseous lesion. IMPRESSION: Bilateral patchy interstitial and alveolar airspace opacities concerning for mild pulmonary edema versus multilobar pneumonia including atypical viral pneumonia. Electronically Signed   By: Kathreen Devoid   On: 01/20/2019 16:26    Medications:  Prior to Admission:  Medications Prior to Admission  Medication Sig Dispense Refill Last Dose  . albuterol (VENTOLIN HFA) 108 (90 Base) MCG/ACT inhaler Inhale 1-2 puffs into the lungs every 4 (four) hours as needed for wheezing or shortness of breath. 8 g 1 Past Week at Unknown time  . amLODipine (NORVASC) 5 MG tablet Take 5 mg by mouth every morning.    01/20/2019 at Unknown time  . aspirin EC 325 MG tablet Take 325 mg by mouth every morning.    01/20/2019 at Unknown time  . buPROPion (WELLBUTRIN XL) 150 MG 24 hr tablet Take 150 mg by mouth every morning.    Past Month at Unknown time  . celecoxib (CELEBREX) 200 MG capsule Take 200 mg by mouth every morning.    01/20/2019 at Unknown time  . Clobetasol Prop Emollient Base 0.05 % emollient cream Apply 1 application topically 2 (two) times daily as needed (itching).    Past Week at Unknown time  . esomeprazole (NEXIUM) 20 MG capsule Take 20 mg by mouth daily.   01/20/2019 at Unknown time  . furosemide (LASIX) 20 MG tablet Take 1 tablet (20 mg total) by mouth every other day. 30 tablet 11 01/18/2019 at Unknown time  . metFORMIN (GLUCOPHAGE) 500 MG tablet Take 500 mg by mouth 3 (three) times daily with meals.    01/20/2019 at Unknown time  . montelukast (SINGULAIR) 10 MG tablet Take 10 mg by mouth at bedtime.   01/19/2019 at Unknown time  . ZORVOLEX 35 MG CAPS Take 1 capsule by mouth 3 (three) times daily.   01/20/2019 at Unknown time  . mometasone-formoterol (  DULERA) 200-5 MCG/ACT AERO Inhale 1 puff into the lungs 2 (two) times daily.  (Patient not taking: Reported on 01/20/2019) 13 g 1 Not Taking at Unknown time   Scheduled: . amLODipine  5 mg Oral q morning - 10a  . aspirin EC  325 mg Oral q morning - 10a  . buPROPion  150 mg Oral q morning - 10a  . celecoxib  200 mg Oral q morning - 10a  . doxycycline  100 mg Oral Q12H  . enoxaparin (LOVENOX) injection  40 mg Subcutaneous Q24H  . furosemide  20 mg Oral QODAY  . insulin aspart  0-9 Units Subcutaneous TID WC  . ipratropium-albuterol  3 mL Nebulization TID  . mometasone-formoterol  1 puff Inhalation BID  . montelukast  10 mg Oral QHS  . pantoprazole  40 mg Oral Daily  . predniSONE  40 mg Oral Q breakfast   Continuous:  [id number] cream, traMADol  Assesment: She has abnormal chest CT.  This could very well be hypersensitivity pneumonitis but she does not have any of the typical precipitating factors.  She could have tobacco induced interstitial lung disease.  Her CT is pretty stable for the last 6 years.  Sarcoidosis is also in the differential.  She has COPD as well.  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  Continue smoking cessation.  Outpatient pulmonary function test. it may be helpful to have a outpatient bronchoscopy with potential biopsy    LOS: 2 days   Alonza Bogus 01/22/2019, 8:49 AM

## 2019-01-22 NOTE — Clinical Social Work Note (Signed)
Patient's oxygen ordered through [employer] and delivered to patient's room. DME choices provided from [facility] website.      Kerston Landeck, Clydene Pugh, LCSW

## 2019-01-22 NOTE — Progress Notes (Signed)
IV removed from left forearm, 2x2 gauze and paper tape applied to site, patient tolerated well. Reviewed AVS with patient and patient verbalized understanding.  Patient to be taken to lobby via wheelchair and transported home by her cousin, Jimmye Norman.

## 2019-01-22 NOTE — Discharge Summary (Signed)
Physician Discharge Summary  Debbie Mullins [id number] DOB: 1955-03-17 DOA: 01/20/2019  PCP: Lucianne Lei, MD  Admit date: 01/20/2019 Discharge date: 01/22/2019  Time spent: 35 minutes  Recommendations for Outpatient Follow-up:  1. Repeat BMEt to follow electrolytes and renal function 2. Reassess CBG and adjust hypoglycemic regimen 3. Reassess BP and further adjust antihypertensive regimen  4. Outpatient follow up with pulmonology service for further evaluation and management, needs PFT's.   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 Discharge home with instructions to follow up with PCP and pulmonology service at discharge.   Diet recommendation: heart healthy and modified carbohydrates diet   Filed Weights   01/20/19 1430 01/21/19 2235  Weight: 78 kg 75.6 kg    History of present illness:  63 y.o.femalewith past medical history significant for COPD, small lung nodules, ongoing tobacco use, DM 2, HTN who was recently discharged from [facility] month ago after treatment for shortness of breath and hypoxia. CT scan showed hazy groundglass opacities throughout both lungs and her Covid test was 19 so she was treated with azithromycin and steroids for COPD exacerbation and community-acquired pneumonia. Patient apparently felt better on discharge however continued to be hypoxic with ambulation. Patient apparently declined home oxygen.  Patient now comes in because her oxygen level was noted to be low by home PT. Patient states that she feels fine. She states she feels no different than she normally does. Patient denies any fevers or chills. No change in rare cough. No nausea  vomiting or diarrhea. No loss of smell or taste. Patient does admit to anorexia however notes that is been going on for several years and that she has lost a significant amount of weight over the past 3 to 4 years.   ED Course:The patient was noted to be afebrile and appear quite well. Initial O2 saturation however was 76% on room air. This corrected to 93% on 2 L. Chest x-ray was noted to have patchy infiltrates on chest x-ray concerning for interstitial pneumonia. Laboratory data were notable for inflammatory markers which were not markedly elevated and a normal BNP.  Hospital Course:  1-acute on chronic respiratory failure with hypoxia ([place]) -Patient declined oxygen use in the past; but will certainly qualify by requirement and in agreement to use it this time.  -Currently stable while receiving oxygen supplementation. -No having any fevers or active wheezing -CT scan high-resolution demonstrating concern for chronic hypersensitivity pneumonitis and possible ILD.. -Patient will be started on steroids, with anticipated slow tapering; oral antibiotics  using doxycycline to cover any potential underlying bronchitis. -Pulmonology service has been consulted and recommendations appreciated. Will need outpatient PFT's and follow up with pulmonologist with potential need for transbronchial biopsy and further treatment.    2-COPD (chronic obstructive pulmonary disease) (HCC) -No active wheezing -Continue home bronchodilator regimen. -outpatient follow with pulmonology service for PFT's and further management.   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 -Follow CBG's and adjust hypoglycemic regimen as needed. -modified carb diet encouraged.   4-HTN (hypertension) -Continue current antihypertensive regimen. -heart healthy diet encouraged.   5-gastroesophageal flux disease) -continue  PPI.  6-depression/anxiety -continue bupropion.   7-CKD stage 1 -associated with DM -continue to maintain adequate hydration and follow renal function trend  8-lung nodules -continue outpatient follow up -pulmonology referral emphasized   Procedures:  See below for x-ray reports.   Consultations:  Pulmonology service.   Discharge Exam: Vitals:   01/22/19 0907 01/22/19 1410  BP:  121/74  Pulse:  81  Resp:  20  Temp:  98.3 F (36.8 C)  SpO2: 94% 93%    General: afebrile, in no acute distress; reporting feeling fine and with good O2 sat on oxygen supplementation. Cardiovascular: RRR, no JVD, no rubs, no gallops.  Respiratory: improved air movement bilaterally, no using accessory muscles, positive rhonchi bilaterally. Abdomen: soft, NT, ND, positive BS. Extremities: no cyanosis or clubbing.   Discharge Instructions   Discharge Instructions    Diet - low sodium heart healthy   Complete by: As directed    Diet Carb Modified   Complete by: As directed    Discharge instructions   Complete by: As directed    Take medications as prescribed Use oxygen supplementation as instructed Follow up with PCP in 10 days Follow up with pulmonologist as an outpatient.     Allergies as of 01/22/2019   No Known Allergies     Medication List    STOP taking these medications   Zorvolex 35 MG Caps Generic drug: Diclofenac     TAKE these medications   albuterol 108 (90 Base) MCG/ACT inhaler Commonly known as: VENTOLIN HFA Inhale 1-2 puffs into the lungs every 4 (four) hours as needed for wheezing or shortness of breath.   amLODipine 5 MG tablet Commonly known as: NORVASC Take 5 mg by mouth every morning.   aspirin EC 325 MG tablet Take 325 mg by mouth every morning.   buPROPion 150 MG 24 hr tablet Commonly known as: WELLBUTRIN XL Take 150 mg by mouth every morning.   celecoxib 200 MG capsule Commonly known as: CELEBREX Take 200 mg by mouth every morning.    Clobetasol Prop Emollient Base 0.05 % emollient cream Apply 1 application topically 2 (two) times daily as needed (itching).   doxycycline 100 MG tablet Commonly known as: VIBRA-TABS Take 1 tablet (100 mg total) by mouth every 12 (twelve) hours for 5 days.   esomeprazole 20 MG capsule Commonly known as: NEXIUM Take 20 mg by mouth daily.   furosemide 20 MG tablet Commonly known as: LASIX Take 1 tablet (20 mg total) by mouth every other day.   metFORMIN 500 MG tablet Commonly known as: GLUCOPHAGE Take 1.5 tablets (750 mg total) by mouth 2 (two) times daily with a meal. What changed:   how much to take  when to take this   mometasone-formoterol 200-5 MCG/ACT Aero Commonly known as: DULERA Inhale 1 puff into the lungs 2 (two) times daily.   montelukast 10 MG tablet Commonly known as: SINGULAIR Take 10 mg by mouth at bedtime.   predniSONE 10 MG tablet Commonly known as: DELTASONE Take 4 tablets by mouth daily X 3 days; and then decrease by 10mg  every 3 days until tapering off completed.            Durable Medical Equipment  (From admission, onward)         Start     Ordered   01/21/19 1831  For home use only DME oxygen  Once    Question Answer Comment  Length of Need Lifetime   Mode or (Route) Nasal cannula  Liters per Minute 3   Frequency Continuous (stationary and portable oxygen unit needed)   Oxygen conserving device Yes   Oxygen delivery system Gas      01/21/19 1834         No Known Allergies Follow-up Information    Lucianne Lei, MD. Schedule an appointment as soon as possible for a visit in 10 day(s).   Specialty: Family Medicine Contact information: [address] [place] [address] [phone]        Brand Males, MD. Schedule an appointment as soon as possible for a visit in 2 week(s).   Specialty: Pulmonary Disease Contact information: [place] [address] [phone]           The  results of significant diagnostics from this hospitalization (including imaging, microbiology, ancillary and laboratory) are listed below for reference.    Significant Diagnostic Studies: Ct Chest High Resolution  Result Date: 01/21/2019 CLINICAL DATA:  Increased shortness of breath for 2 weeks.  Smoker. EXAM: CT CHEST WITHOUT CONTRAST TECHNIQUE: Multidetector CT imaging of the chest was performed following the standard protocol without intravenous contrast. High resolution imaging of the lungs, as well as inspiratory and expiratory imaging, was performed. COMPARISON:  10/24/2018, 10/23/2017 and 07/09/2012. FINDINGS: Cardiovascular: Atherosclerotic calcification of the aorta, aortic valve and coronary arteries. Pulmonic trunk is enlarged. Heart is at the upper limits of normal in size. No pericardial effusion. Mediastinum/Nodes: Mediastinal lymph nodes are not enlarged by CT size criteria. Hilar regions are difficult to evaluate without IV contrast. No axillary adenopathy. Esophagus is grossly unremarkable. Lungs/Pleura: Mosaic pulmonary parenchymal attenuation with upper and midlung zone predominant reticulonodularity. Nodularity is seen along the fissures. Findings appear chronic when compared with exams dating as far back as 07/09/2012. Mild associated bronchiectasis. Pulmonary nodules measure up to 5 mm in the right middle lobe, are unchanged from 07/09/2012 and considered benign. Volume loss in the medial left lower lobe has increased from 12/15/2018. There is air trapping. No pleural fluid. Airway is otherwise unremarkable. Upper Abdomen: Visualized portions of the liver, spleen, stomach and bowel are grossly unremarkable. Musculoskeletal: Degenerative changes in the spine and right shoulder. Posterior osteophytosis in the mid and lower thoracic spine extends into the spinal canal. No worrisome lytic or sclerotic lesions. IMPRESSION: 1. Pulmonary parenchymal pattern of reticulonodularity and air trapping  appears similar to prior exams. Differential diagnosis includes chronic hypersensitivity pneumonitis and sarcoid. Findings are suggestive of an alternative diagnosis (not UIP) per consensus guidelines: Diagnosis of Idiopathic Pulmonary Fibrosis: An Official ATS/ERS/JRS/ALAT Clinical Practice Guideline. [place], Iss 5, [phone], Nov 10 2016. 2. Increasing volume loss in the infrahilar and medial left lower lobe, difficult to exclude a component of pneumonia. 3. Aortic atherosclerosis (ICD10-170.0). Coronary artery calcification. 4. Enlarged pulmonic trunk, indicative of pulmonary arterial hypertension. Electronically Signed   By: [name] M.D.   On: 01/21/2019 12:22   Dg Chest Port 1 View  Result Date: 01/20/2019 CLINICAL DATA:  Shortness of breath EXAM: PORTABLE CHEST 1 VIEW COMPARISON:  12/15/2018 FINDINGS: Bilateral patchy interstitial and alveolar airspace opacities. No pleural effusion or pneumothorax. Stable cardiomediastinal silhouette. No aggressive osseous lesion. IMPRESSION: Bilateral patchy interstitial and alveolar airspace opacities concerning for mild pulmonary edema versus multilobar pneumonia including atypical viral pneumonia. Electronically Signed   By: Kathreen Devoid   On: 01/20/2019 16:26    Microbiology: Recent Results (from the past 240 hour(s))  SARS CORONAVIRUS 2 (TAT 6-24  HRS) Nasopharyngeal Nasopharyngeal Swab     Status: None   Collection Time: 01/20/19  4:37 PM   Specimen: Nasopharyngeal Swab  Result Value Ref Range Status   SARS Coronavirus 2 NEGATIVE NEGATIVE Final    Comment: (NOTE) SARS-CoV-2 target nucleic acids are NOT DETECTED. The SARS-CoV-2 RNA is generally detectable in upper and lower respiratory specimens during the acute phase of infection. Negative results do not preclude SARS-CoV-2 infection, do not rule out co-infections with other pathogens, and should not be used as the sole basis for treatment or other patient  management decisions. Negative results must be combined with clinical observations, patient history, and epidemiological information. The expected result is Negative. Fact Sheet for Patients: SugarRoll.be Fact Sheet for Healthcare Providers: https://www.woods-mathews.com/ This test is not yet approved or cleared by the [place] FDA and  has been authorized for detection and/or diagnosis of SARS-CoV-2 by FDA under an Emergency Use Authorization (EUA). This EUA will remain  in effect (meaning this test can be used) for the duration of the COVID-19 declaration under Section 56 4(b)(1) of the Act, 21 U.S.C. section 360bbb-3(b)(1), unless the authorization is terminated or revoked sooner. Performed at [facility], [place] [address] [place], [place] [address]   Blood Culture (routine x 2)     Status: None (Preliminary result)   Collection Time: 01/20/19  4:53 PM   Specimen: Right Antecubital; Blood  Result Value Ref Range Status   Specimen Description RIGHT ANTECUBITAL  Final   Special Requests   Final    BOTTLES DRAWN AEROBIC AND ANAEROBIC Blood Culture adequate volume   Culture   Final    NO GROWTH 2 DAYS Performed at [facility], [address] [place], [place] [address]    Report Status PENDING  Incomplete  Blood Culture (routine x 2)     Status: None (Preliminary result)   Collection Time: 01/20/19  4:54 PM   Specimen: BLOOD RIGHT HAND  Result Value Ref Range Status   Specimen Description BLOOD RIGHT HAND  Final   Special Requests   Final    BOTTLES DRAWN AEROBIC AND ANAEROBIC Blood Culture adequate volume   Culture   Final    NO GROWTH 2 DAYS Performed at [facility], [address] [place], [place] [address]    Report Status PENDING  Incomplete     Labs: Basic Metabolic Panel: Recent Labs  Lab 01/20/19 1506 01/21/19 0416  NA 134* 139  K 4.2 4.1  CL 98 100  CO2 24 29  GLUCOSE 107* 107*  BUN 15 15  CREATININE  0.83 0.84  CALCIUM 8.1* 8.0*   Liver Function Tests: Recent Labs  Lab 01/20/19 1506  AST 15  ALT 13  ALKPHOS 125  BILITOT 0.6  PROT 8.0  ALBUMIN 4.0   CBC: Recent Labs  Lab 01/20/19 1506 01/21/19 0416  WBC 12.2* 8.0  NEUTROABS 10.0*  --   HGB 11.9* 11.5*  HCT 37.4 36.7  MCV 94.4 95.3  PLT 502* 450*   BNP (last 3 results) Recent Labs    12/15/18 1453 01/20/19 1506  BNP 75.1 63.0    CBG: Recent Labs  Lab 01/21/19 1237 01/21/19 1537 01/21/19 2035 01/22/19 0735 01/22/19 1115  GLUCAP 132* 141* 189* 87 161*    Signed:  Barton Dubois MD.  [facility] 01/22/2019, 2:31 PM

## 2019-01-24 DIAGNOSIS — K219 Gastro-esophageal reflux disease without esophagitis: Secondary | ICD-10-CM | POA: Diagnosis not present

## 2019-01-24 DIAGNOSIS — M1990 Unspecified osteoarthritis, unspecified site: Secondary | ICD-10-CM | POA: Diagnosis not present

## 2019-01-24 DIAGNOSIS — E1122 Type 2 diabetes mellitus with diabetic chronic kidney disease: Secondary | ICD-10-CM | POA: Diagnosis not present

## 2019-01-24 DIAGNOSIS — D72829 Elevated white blood cell count, unspecified: Secondary | ICD-10-CM | POA: Diagnosis not present

## 2019-01-24 DIAGNOSIS — R918 Other nonspecific abnormal finding of lung field: Secondary | ICD-10-CM | POA: Diagnosis not present

## 2019-01-24 DIAGNOSIS — D509 Iron deficiency anemia, unspecified: Secondary | ICD-10-CM | POA: Diagnosis not present

## 2019-01-24 DIAGNOSIS — R350 Frequency of micturition: Secondary | ICD-10-CM | POA: Diagnosis not present

## 2019-01-24 DIAGNOSIS — J9601 Acute respiratory failure with hypoxia: Secondary | ICD-10-CM | POA: Diagnosis not present

## 2019-01-24 DIAGNOSIS — I10 Essential (primary) hypertension: Secondary | ICD-10-CM | POA: Diagnosis not present

## 2019-01-24 DIAGNOSIS — N182 Chronic kidney disease, stage 2 (mild): Secondary | ICD-10-CM | POA: Diagnosis not present

## 2019-01-24 DIAGNOSIS — J441 Chronic obstructive pulmonary disease with (acute) exacerbation: Secondary | ICD-10-CM | POA: Diagnosis not present

## 2019-01-24 DIAGNOSIS — R569 Unspecified convulsions: Secondary | ICD-10-CM | POA: Diagnosis not present

## 2019-01-25 LAB — CULTURE, BLOOD (ROUTINE X 2)
Culture: NO GROWTH
Culture: NO GROWTH
Special Requests: ADEQUATE
Special Requests: ADEQUATE

## 2019-01-26 DIAGNOSIS — J449 Chronic obstructive pulmonary disease, unspecified: Secondary | ICD-10-CM | POA: Diagnosis not present

## 2019-01-27 DIAGNOSIS — D72829 Elevated white blood cell count, unspecified: Secondary | ICD-10-CM | POA: Diagnosis not present

## 2019-01-27 DIAGNOSIS — E1122 Type 2 diabetes mellitus with diabetic chronic kidney disease: Secondary | ICD-10-CM | POA: Diagnosis not present

## 2019-01-27 DIAGNOSIS — R350 Frequency of micturition: Secondary | ICD-10-CM | POA: Diagnosis not present

## 2019-01-27 DIAGNOSIS — R918 Other nonspecific abnormal finding of lung field: Secondary | ICD-10-CM | POA: Diagnosis not present

## 2019-01-27 DIAGNOSIS — D509 Iron deficiency anemia, unspecified: Secondary | ICD-10-CM | POA: Diagnosis not present

## 2019-01-27 DIAGNOSIS — K219 Gastro-esophageal reflux disease without esophagitis: Secondary | ICD-10-CM | POA: Diagnosis not present

## 2019-01-27 DIAGNOSIS — I10 Essential (primary) hypertension: Secondary | ICD-10-CM | POA: Diagnosis not present

## 2019-01-27 DIAGNOSIS — M1990 Unspecified osteoarthritis, unspecified site: Secondary | ICD-10-CM | POA: Diagnosis not present

## 2019-01-27 DIAGNOSIS — J9601 Acute respiratory failure with hypoxia: Secondary | ICD-10-CM | POA: Diagnosis not present

## 2019-01-27 DIAGNOSIS — J441 Chronic obstructive pulmonary disease with (acute) exacerbation: Secondary | ICD-10-CM | POA: Diagnosis not present

## 2019-01-27 DIAGNOSIS — N182 Chronic kidney disease, stage 2 (mild): Secondary | ICD-10-CM | POA: Diagnosis not present

## 2019-01-27 DIAGNOSIS — R569 Unspecified convulsions: Secondary | ICD-10-CM | POA: Diagnosis not present

## 2019-01-28 DIAGNOSIS — I10 Essential (primary) hypertension: Secondary | ICD-10-CM | POA: Diagnosis not present

## 2019-01-28 DIAGNOSIS — J449 Chronic obstructive pulmonary disease, unspecified: Secondary | ICD-10-CM | POA: Diagnosis not present

## 2019-01-28 DIAGNOSIS — J9621 Acute and chronic respiratory failure with hypoxia: Secondary | ICD-10-CM | POA: Diagnosis not present

## 2019-01-29 DIAGNOSIS — R918 Other nonspecific abnormal finding of lung field: Secondary | ICD-10-CM | POA: Diagnosis not present

## 2019-01-29 DIAGNOSIS — M1990 Unspecified osteoarthritis, unspecified site: Secondary | ICD-10-CM | POA: Diagnosis not present

## 2019-01-29 DIAGNOSIS — N182 Chronic kidney disease, stage 2 (mild): Secondary | ICD-10-CM | POA: Diagnosis not present

## 2019-01-29 DIAGNOSIS — I10 Essential (primary) hypertension: Secondary | ICD-10-CM | POA: Diagnosis not present

## 2019-01-29 DIAGNOSIS — E1122 Type 2 diabetes mellitus with diabetic chronic kidney disease: Secondary | ICD-10-CM | POA: Diagnosis not present

## 2019-01-29 DIAGNOSIS — D72829 Elevated white blood cell count, unspecified: Secondary | ICD-10-CM | POA: Diagnosis not present

## 2019-01-29 DIAGNOSIS — J9601 Acute respiratory failure with hypoxia: Secondary | ICD-10-CM | POA: Diagnosis not present

## 2019-01-29 DIAGNOSIS — J441 Chronic obstructive pulmonary disease with (acute) exacerbation: Secondary | ICD-10-CM | POA: Diagnosis not present

## 2019-01-29 DIAGNOSIS — K219 Gastro-esophageal reflux disease without esophagitis: Secondary | ICD-10-CM | POA: Diagnosis not present

## 2019-01-29 DIAGNOSIS — R569 Unspecified convulsions: Secondary | ICD-10-CM | POA: Diagnosis not present

## 2019-01-29 DIAGNOSIS — D509 Iron deficiency anemia, unspecified: Secondary | ICD-10-CM | POA: Diagnosis not present

## 2019-01-29 DIAGNOSIS — R350 Frequency of micturition: Secondary | ICD-10-CM | POA: Diagnosis not present

## 2019-02-03 ENCOUNTER — Institutional Professional Consult (permissible substitution): Payer: Medicare Other | Admitting: Pulmonary Disease

## 2019-02-03 DIAGNOSIS — R350 Frequency of micturition: Secondary | ICD-10-CM | POA: Diagnosis not present

## 2019-02-03 DIAGNOSIS — J441 Chronic obstructive pulmonary disease with (acute) exacerbation: Secondary | ICD-10-CM | POA: Diagnosis not present

## 2019-02-03 DIAGNOSIS — N182 Chronic kidney disease, stage 2 (mild): Secondary | ICD-10-CM | POA: Diagnosis not present

## 2019-02-03 DIAGNOSIS — D72829 Elevated white blood cell count, unspecified: Secondary | ICD-10-CM | POA: Diagnosis not present

## 2019-02-03 DIAGNOSIS — J9601 Acute respiratory failure with hypoxia: Secondary | ICD-10-CM | POA: Diagnosis not present

## 2019-02-03 DIAGNOSIS — K219 Gastro-esophageal reflux disease without esophagitis: Secondary | ICD-10-CM | POA: Diagnosis not present

## 2019-02-03 DIAGNOSIS — I10 Essential (primary) hypertension: Secondary | ICD-10-CM | POA: Diagnosis not present

## 2019-02-03 DIAGNOSIS — M1990 Unspecified osteoarthritis, unspecified site: Secondary | ICD-10-CM | POA: Diagnosis not present

## 2019-02-03 DIAGNOSIS — D509 Iron deficiency anemia, unspecified: Secondary | ICD-10-CM | POA: Diagnosis not present

## 2019-02-03 DIAGNOSIS — R918 Other nonspecific abnormal finding of lung field: Secondary | ICD-10-CM | POA: Diagnosis not present

## 2019-02-03 DIAGNOSIS — E1122 Type 2 diabetes mellitus with diabetic chronic kidney disease: Secondary | ICD-10-CM | POA: Diagnosis not present

## 2019-02-03 DIAGNOSIS — R569 Unspecified convulsions: Secondary | ICD-10-CM | POA: Diagnosis not present

## 2019-02-04 DIAGNOSIS — D72829 Elevated white blood cell count, unspecified: Secondary | ICD-10-CM | POA: Diagnosis not present

## 2019-02-04 DIAGNOSIS — E1122 Type 2 diabetes mellitus with diabetic chronic kidney disease: Secondary | ICD-10-CM | POA: Diagnosis not present

## 2019-02-04 DIAGNOSIS — M1990 Unspecified osteoarthritis, unspecified site: Secondary | ICD-10-CM | POA: Diagnosis not present

## 2019-02-04 DIAGNOSIS — J9601 Acute respiratory failure with hypoxia: Secondary | ICD-10-CM | POA: Diagnosis not present

## 2019-02-04 DIAGNOSIS — R918 Other nonspecific abnormal finding of lung field: Secondary | ICD-10-CM | POA: Diagnosis not present

## 2019-02-04 DIAGNOSIS — D509 Iron deficiency anemia, unspecified: Secondary | ICD-10-CM | POA: Diagnosis not present

## 2019-02-04 DIAGNOSIS — J441 Chronic obstructive pulmonary disease with (acute) exacerbation: Secondary | ICD-10-CM | POA: Diagnosis not present

## 2019-02-04 DIAGNOSIS — K219 Gastro-esophageal reflux disease without esophagitis: Secondary | ICD-10-CM | POA: Diagnosis not present

## 2019-02-04 DIAGNOSIS — N182 Chronic kidney disease, stage 2 (mild): Secondary | ICD-10-CM | POA: Diagnosis not present

## 2019-02-04 DIAGNOSIS — I10 Essential (primary) hypertension: Secondary | ICD-10-CM | POA: Diagnosis not present

## 2019-02-04 DIAGNOSIS — R350 Frequency of micturition: Secondary | ICD-10-CM | POA: Diagnosis not present

## 2019-02-04 DIAGNOSIS — R569 Unspecified convulsions: Secondary | ICD-10-CM | POA: Diagnosis not present

## 2019-02-06 DIAGNOSIS — J441 Chronic obstructive pulmonary disease with (acute) exacerbation: Secondary | ICD-10-CM | POA: Diagnosis not present

## 2019-02-06 DIAGNOSIS — E1122 Type 2 diabetes mellitus with diabetic chronic kidney disease: Secondary | ICD-10-CM | POA: Diagnosis not present

## 2019-02-06 DIAGNOSIS — R350 Frequency of micturition: Secondary | ICD-10-CM | POA: Diagnosis not present

## 2019-02-06 DIAGNOSIS — J9601 Acute respiratory failure with hypoxia: Secondary | ICD-10-CM | POA: Diagnosis not present

## 2019-02-06 DIAGNOSIS — D509 Iron deficiency anemia, unspecified: Secondary | ICD-10-CM | POA: Diagnosis not present

## 2019-02-06 DIAGNOSIS — R918 Other nonspecific abnormal finding of lung field: Secondary | ICD-10-CM | POA: Diagnosis not present

## 2019-02-06 DIAGNOSIS — I10 Essential (primary) hypertension: Secondary | ICD-10-CM | POA: Diagnosis not present

## 2019-02-06 DIAGNOSIS — M1990 Unspecified osteoarthritis, unspecified site: Secondary | ICD-10-CM | POA: Diagnosis not present

## 2019-02-06 DIAGNOSIS — N182 Chronic kidney disease, stage 2 (mild): Secondary | ICD-10-CM | POA: Diagnosis not present

## 2019-02-06 DIAGNOSIS — D72829 Elevated white blood cell count, unspecified: Secondary | ICD-10-CM | POA: Diagnosis not present

## 2019-02-06 DIAGNOSIS — K219 Gastro-esophageal reflux disease without esophagitis: Secondary | ICD-10-CM | POA: Diagnosis not present

## 2019-02-06 DIAGNOSIS — R569 Unspecified convulsions: Secondary | ICD-10-CM | POA: Diagnosis not present

## 2019-02-10 DIAGNOSIS — J441 Chronic obstructive pulmonary disease with (acute) exacerbation: Secondary | ICD-10-CM | POA: Diagnosis not present

## 2019-02-10 DIAGNOSIS — D509 Iron deficiency anemia, unspecified: Secondary | ICD-10-CM | POA: Diagnosis not present

## 2019-02-10 DIAGNOSIS — E1122 Type 2 diabetes mellitus with diabetic chronic kidney disease: Secondary | ICD-10-CM | POA: Diagnosis not present

## 2019-02-10 DIAGNOSIS — R569 Unspecified convulsions: Secondary | ICD-10-CM | POA: Diagnosis not present

## 2019-02-10 DIAGNOSIS — R350 Frequency of micturition: Secondary | ICD-10-CM | POA: Diagnosis not present

## 2019-02-10 DIAGNOSIS — R918 Other nonspecific abnormal finding of lung field: Secondary | ICD-10-CM | POA: Diagnosis not present

## 2019-02-10 DIAGNOSIS — I10 Essential (primary) hypertension: Secondary | ICD-10-CM | POA: Diagnosis not present

## 2019-02-10 DIAGNOSIS — M1990 Unspecified osteoarthritis, unspecified site: Secondary | ICD-10-CM | POA: Diagnosis not present

## 2019-02-10 DIAGNOSIS — D72829 Elevated white blood cell count, unspecified: Secondary | ICD-10-CM | POA: Diagnosis not present

## 2019-02-10 DIAGNOSIS — J9601 Acute respiratory failure with hypoxia: Secondary | ICD-10-CM | POA: Diagnosis not present

## 2019-02-10 DIAGNOSIS — K219 Gastro-esophageal reflux disease without esophagitis: Secondary | ICD-10-CM | POA: Diagnosis not present

## 2019-02-10 DIAGNOSIS — N182 Chronic kidney disease, stage 2 (mild): Secondary | ICD-10-CM | POA: Diagnosis not present

## 2019-02-12 DIAGNOSIS — N182 Chronic kidney disease, stage 2 (mild): Secondary | ICD-10-CM | POA: Diagnosis not present

## 2019-02-12 DIAGNOSIS — M1990 Unspecified osteoarthritis, unspecified site: Secondary | ICD-10-CM | POA: Diagnosis not present

## 2019-02-12 DIAGNOSIS — J9601 Acute respiratory failure with hypoxia: Secondary | ICD-10-CM | POA: Diagnosis not present

## 2019-02-12 DIAGNOSIS — I10 Essential (primary) hypertension: Secondary | ICD-10-CM | POA: Diagnosis not present

## 2019-02-12 DIAGNOSIS — J441 Chronic obstructive pulmonary disease with (acute) exacerbation: Secondary | ICD-10-CM | POA: Diagnosis not present

## 2019-02-12 DIAGNOSIS — D509 Iron deficiency anemia, unspecified: Secondary | ICD-10-CM | POA: Diagnosis not present

## 2019-02-12 DIAGNOSIS — R569 Unspecified convulsions: Secondary | ICD-10-CM | POA: Diagnosis not present

## 2019-02-12 DIAGNOSIS — R350 Frequency of micturition: Secondary | ICD-10-CM | POA: Diagnosis not present

## 2019-02-12 DIAGNOSIS — R918 Other nonspecific abnormal finding of lung field: Secondary | ICD-10-CM | POA: Diagnosis not present

## 2019-02-12 DIAGNOSIS — K219 Gastro-esophageal reflux disease without esophagitis: Secondary | ICD-10-CM | POA: Diagnosis not present

## 2019-02-12 DIAGNOSIS — E1122 Type 2 diabetes mellitus with diabetic chronic kidney disease: Secondary | ICD-10-CM | POA: Diagnosis not present

## 2019-02-12 DIAGNOSIS — D72829 Elevated white blood cell count, unspecified: Secondary | ICD-10-CM | POA: Diagnosis not present

## 2019-02-17 ENCOUNTER — Institutional Professional Consult (permissible substitution): Payer: Medicare Other | Admitting: Pulmonary Disease

## 2019-02-20 ENCOUNTER — Other Ambulatory Visit: Payer: Self-pay

## 2019-02-20 NOTE — Patient Outreach (Signed)
[facility]) Care Management  02/20/2019  Debbie Mullins [birth year] [id number]   Medication Adherence call to Debbie Mullins [facility] [employer] Verify spoke with patient she is past due on metformin 500 mg by 75 days,patient said she has medication until she received her medications from [facility].when calling [facility] they said patient had transfer to [facility] patient back she said she had not transfer and will be calling her self to order her medications,patient was a bit  Upset she claim she has never transfer her prescriptions and has always receiving it from [facility]. Debbie Mullins is showing past due under [place].   [place] Management Direct Dial [phone]  Fax [phone] [email or web address]

## 2019-02-23 ENCOUNTER — Inpatient Hospital Stay (HOSPITAL_COMMUNITY): Payer: Medicare Other

## 2019-02-24 ENCOUNTER — Institutional Professional Consult (permissible substitution): Payer: Medicare Other | Admitting: Pulmonary Disease

## 2019-02-25 DIAGNOSIS — J449 Chronic obstructive pulmonary disease, unspecified: Secondary | ICD-10-CM | POA: Diagnosis not present

## 2019-02-27 DIAGNOSIS — J441 Chronic obstructive pulmonary disease with (acute) exacerbation: Secondary | ICD-10-CM | POA: Diagnosis not present

## 2019-02-27 DIAGNOSIS — Z9981 Dependence on supplemental oxygen: Secondary | ICD-10-CM | POA: Diagnosis not present

## 2019-02-27 DIAGNOSIS — I10 Essential (primary) hypertension: Secondary | ICD-10-CM | POA: Diagnosis not present

## 2019-02-27 DIAGNOSIS — E1169 Type 2 diabetes mellitus with other specified complication: Secondary | ICD-10-CM | POA: Diagnosis not present

## 2019-03-02 ENCOUNTER — Inpatient Hospital Stay (HOSPITAL_COMMUNITY): Payer: Medicare Other | Attending: Hematology

## 2019-03-02 ENCOUNTER — Encounter: Payer: Self-pay | Admitting: Pulmonary Disease

## 2019-03-02 ENCOUNTER — Ambulatory Visit (HOSPITAL_COMMUNITY): Payer: Medicare Other | Admitting: Hematology

## 2019-03-02 ENCOUNTER — Ambulatory Visit: Payer: Medicare Other | Admitting: Pulmonary Disease

## 2019-03-02 ENCOUNTER — Other Ambulatory Visit: Payer: Self-pay

## 2019-03-02 VITALS — BP 128/74 | HR 88 | Temp 98.0°F | Ht 63.0 in | Wt 172.8 lb

## 2019-03-02 DIAGNOSIS — J9611 Chronic respiratory failure with hypoxia: Secondary | ICD-10-CM

## 2019-03-02 DIAGNOSIS — J449 Chronic obstructive pulmonary disease, unspecified: Secondary | ICD-10-CM | POA: Diagnosis not present

## 2019-03-02 HISTORY — DX: Chronic respiratory failure with hypoxia: J96.11

## 2019-03-02 MED ORDER — ANORO ELLIPTA 62.5-25 MCG/INH IN AEPB: 1.0000 | INHALATION_SPRAY | Freq: Every day | RESPIRATORY_TRACT | 1 refills | Status: DC

## 2019-03-02 MED ORDER — ANORO ELLIPTA 62.5-25 MCG/INH IN AEPB: 1.0000 | INHALATION_SPRAY | Freq: Every day | RESPIRATORY_TRACT | 0 refills | Status: DC

## 2019-03-02 NOTE — Progress Notes (Signed)
Subjective:   PATIENT ID: Debbie Mullins GENDER: female DOB: [birth year], MRN: [id number]   HPI  Chief Complaint  Patient presents with  . Consult    Referred from hospital for PNA, COPD.     Reason for Visit: New consult for COPD, hospital follow-up  [name] is a 63 year old female active smoker who presents for COPD follow-up.  She has had multiple hospitalizations in October and November and recently discharged for COPD exacerbation. She was seen by Pulmonary consult team for abnormal CT Chest possibly concerning for hypersensitivity pneumonitis however no risk factors. She was discharged with oxygen and steroids. She is not currently on any inhalers and overall feels she is doing much better since being discharged. She still has shortness of breath on exertion which is improved with oxygen use. Prior to hospitalization she had wheezing, coughing and dyspnea on exertion requiring her to stop every few steps while doing chores. Now with oxygen, she is able to clean the house and ambulate from room to room without issues. She quit smoking at the end of November and has been using patches and gum with success.  Social History: 41 pack-years. Quit 01/2019.  I have personally reviewed patient's past medical/family/social history, allergies, current medications.  Past Medical History:  Diagnosis Date  . Anemia   . Asthma   . CHF (congestive heart failure) (HCC)    takes Furosemide daily   . COPD (chronic obstructive pulmonary disease) (HCC)    Albuterol inhaler prn;SIngulair at night  . Depression    takes Wellbutrin daily  . Diabetes mellitus    takes Metformin daily  . GERD (gastroesophageal reflux disease)    takes Nexium daily  . History of colon polyps    benign  . Hypertension    takes Lisinopril daily  . Iron deficiency anemia 08/01/2017  . Joint pain   . Joint swelling   . Leukocytosis 02/09/2011  . Neck pain    HNP  . Normocytic anemia  02/09/2011  . Peripheral edema    takes Furosemide daily  . Pneumonia    many yrs ago  . Pulmonary nodules/lesions, multiple 08/09/2013  . Shortness of breath dyspnea    with exertion  . Thrombocytosis ([place]) 02/09/2011  . Urinary frequency   . Urinary urgency   . Weakness    numbness and tingling in both hands     Family History  Problem Relation Age of Onset  . Heart failure Mother   . Cancer Maternal Grandmother   . Colon cancer Neg Hx   . Colon polyps Neg Hx      Social History   Occupational History  . Not on file  Tobacco Use  . Smoking status: Current Every Day Smoker    Packs/day: 0.50    Years: 41.00    Pack years: 20.50    Types: Cigarettes  . Smokeless tobacco: Never Used  Substance and Sexual Activity  . Alcohol use: Yes    Comment: wine  . Drug use: No  . Sexual activity: Never    No Known Allergies   Outpatient Medications Prior to Visit  Medication Sig Dispense Refill  . albuterol (VENTOLIN HFA) 108 (90 Base) MCG/ACT inhaler Inhale 1-2 puffs into the lungs every 4 (four) hours as needed for wheezing or shortness of breath. 8 g 1  . amLODipine (NORVASC) 5 MG tablet Take 5 mg by mouth every morning.     Marland Kitchen aspirin EC 325 MG tablet Take  325 mg by mouth every morning.     Marland Kitchen buPROPion (WELLBUTRIN XL) 150 MG 24 hr tablet Take 150 mg by mouth every morning.     . celecoxib (CELEBREX) 200 MG capsule Take 200 mg by mouth every morning.     . Clobetasol Prop Emollient Base 0.05 % emollient cream Apply 1 application topically 2 (two) times daily as needed (itching).     Marland Kitchen esomeprazole (NEXIUM) 20 MG capsule Take 20 mg by mouth daily.    . furosemide (LASIX) 20 MG tablet Take 1 tablet (20 mg total) by mouth every other day. 30 tablet 11  . metFORMIN (GLUCOPHAGE) 500 MG tablet Take 1.5 tablets (750 mg total) by mouth 2 (two) times daily with a meal.    . mometasone-formoterol (DULERA) 200-5 MCG/ACT AERO Inhale 1 puff into the lungs 2 (two) times daily. (Patient  not taking: Reported on 01/20/2019) 13 g 1  . montelukast (SINGULAIR) 10 MG tablet Take 10 mg by mouth at bedtime.    . predniSONE (DELTASONE) 10 MG tablet Take 4 tablets by mouth daily X 3 days; and then decrease by 10mg  every 3 days until tapering off completed. 30 tablet 0   No facility-administered medications prior to visit.    Review of Systems  Constitutional: Negative for chills, diaphoresis, fever, malaise/fatigue and weight loss.  HENT: Negative for congestion, ear pain and sore throat.   Respiratory: Positive for shortness of breath. Negative for cough, hemoptysis, sputum production and wheezing.   Cardiovascular: Positive for palpitations and leg swelling. Negative for chest pain.  Gastrointestinal: Negative for abdominal pain, heartburn and nausea.  Genitourinary: Negative for frequency.  Musculoskeletal: Negative for joint pain and myalgias.  Skin: Negative for itching and rash.  Neurological: Negative for dizziness, weakness and headaches.  Endo/Heme/Allergies: Does not bruise/bleed easily.  Psychiatric/Behavioral: Negative for depression. The patient is not nervous/anxious.      Objective:   Vitals:   03/02/19 1421  BP: 128/74  Pulse: 88  Temp: 98 F (36.7 C)  TempSrc: Temporal  SpO2: 96%  Weight: 172 lb 12.8 oz (78.4 kg)  Height: 5\' 3"  (1.6 m)     Physical Exam: General: Well-appearing, no acute distress HENT: [place], AT Eyes: EOMI, no scleral icterus Respiratory: Diminished breath sounds bilaterally. No crackles, wheezing or rales Cardiovascular: RRR, -M/R/G, no JVD GI: BS+, soft, nontender Extremities:Non-pitting lower extremity edema,-tenderness Neuro: AAO x4, CNII-XII grossly intact Skin: Intact, no rashes or bruising Psych: Normal mood, normal affect  Data Reviewed:  Imaging: CT Chest 01/21/19 - Parenchyma with mosaic attenuation and reticulonodular pattern bilaterally. Stable benign pulmonary nodules   PFT: None on file  Labs: CBC      Component Value Date/Time   WBC 8.0 01/21/2019 0416   RBC 3.85 (L) 01/21/2019 0416   HGB 11.5 (L) 01/21/2019 0416   HCT 36.7 01/21/2019 0416   PLT 450 (H) 01/21/2019 0416   MCV 95.3 01/21/2019 0416   MCH 29.9 01/21/2019 0416   MCHC 31.3 01/21/2019 0416   RDW 14.6 01/21/2019 0416   LYMPHSABS 1.4 01/20/2019 1506   MONOABS 0.6 01/20/2019 1506   EOSABS 0.1 01/20/2019 1506   BASOSABS 0.1 01/20/2019 1506   BMET    Component Value Date/Time   NA 139 01/21/2019 0416   K 4.1 01/21/2019 0416   CL 100 01/21/2019 0416   CO2 29 01/21/2019 0416   GLUCOSE 107 (H) 01/21/2019 0416   BUN 15 01/21/2019 0416   CREATININE 0.84 01/21/2019 0416   CALCIUM 8.0 (  L) 01/21/2019 0416   GFRNONAA >60 01/21/2019 0416   GFRAA >60 01/21/2019 0416   Ambulatory O2 03/02/19 DME: [employer] Patient Saturations on Room Air at Rest = 84% Patient Saturations on 3 Liters of oxygen while Ambulating = 93% CONCLUSION: --Recommend wearing 2L at rest --Recommend wearing 3L with activity and sleep  Imaging, labs and tests noted above have been reviewed independently by me.    Assessment & Plan:   Discussion:  63 year old female active smoker who presents for COPD follow-up. Recently had two hospitalizations in 2020 for COPD exacerbation and discharged on home O2. She continues to have dyspnea and not currently on bronchodilators. Compliant with her home oxygen.  COPD, unknown severity - uncontrolled GOLD Class C/D --START Anoro ONE puff ONCE a day. This is your EVERYDAY inhaler. --START Albuterol as needed. This is your RESCUE inhaler to use as needed for shortness of breath or wheezing  Chronic hypoxemic respiratory failure --Recommend wearing 2L at rest --Recommend wearing 3L with activity and sleep  We will arrange for pulmonary function tests in the future.   Health Maintenance Immunization History  Administered Date(s) Administered  . Influenza Split 01/22/2016  . Influenza,inj,Quad PF,6+ Mos  11/18/2014, 01/26/2018, 12/27/2018  . Influenza,trivalent, recombinat, inj, PF 01/06/2017  . Influenza-Unspecified 03/12/2014  . Pneumococcal Polysaccharide-23 08/12/2013   CT Lung Screen - not due until 01/2020  Orders Placed This Encounter  Procedures  . Pulmonary function test    Standing Status:   Future    Standing Expiration Date:   03/01/2020    Order Specific Question:   Where should this test be performed?    Answer:   [facility]    Order Specific Question:   Full PFT: includes the following: basic spirometry, spirometry pre & post bronchodilator, diffusion capacity (DLCO), lung volumes    Answer:   Full PFT    Order Specific Question:   MIP/MEP    Answer:   No    Order Specific Question:   6 minute walk    Answer:   No    Order Specific Question:   ABG    Answer:   No    Order Specific Question:   Diffusion capacity (DLCO)    Answer:   Yes    Order Specific Question:   Lung volumes    Answer:   Yes    Order Specific Question:   Methacholine challenge    Answer:   No   Meds ordered this encounter  Medications  . umeclidinium-vilanterol (ANORO ELLIPTA) 62.5-25 MCG/INH AEPB    Sig: Inhale 1 puff into the lungs daily.    Dispense:  4 each    Refill:  0    Order Specific Question:   Lot Number?    Answer:   [id number]    Order Specific Question:   Expiration Date?    Answer:   07/10/2020    Order Specific Question:   Manufacturer?    Answer:   [employer] [[id number]]    Order Specific Question:   Quantity    Answer:   2  . umeclidinium-vilanterol (ANORO ELLIPTA) 62.5-25 MCG/INH AEPB    Sig: Inhale 1 puff into the lungs daily.    Dispense:  180 each    Refill:  1   Return in about 3 months (around 05/31/2019).  [place], MD [facility] 03/02/2019 12:18 PM  Office Number [phone]

## 2019-03-02 NOTE — Patient Instructions (Addendum)
COPD, unknown severity GOLD Class C/D --START Anoro ONE puff ONCE a day. This is your EVERYDAY inhaler. --START Albuterol as needed. This is your RESCUE inhaler to use as needed for shortness of breath or wheezing  Chronic hypoxemic respiratory failure --Recommend wearing 2L at rest --Recommend wearing 3L with activity and sleep  We will arrange for pulmonary function tests in the future.  Follow-up with me in 3 months

## 2019-03-09 ENCOUNTER — Inpatient Hospital Stay (HOSPITAL_COMMUNITY): Payer: Medicare Other | Attending: Hematology

## 2019-03-09 ENCOUNTER — Ambulatory Visit (HOSPITAL_COMMUNITY): Payer: Medicare Other | Admitting: Hematology

## 2019-03-09 ENCOUNTER — Other Ambulatory Visit: Payer: Self-pay

## 2019-03-09 DIAGNOSIS — D509 Iron deficiency anemia, unspecified: Secondary | ICD-10-CM | POA: Diagnosis not present

## 2019-03-09 LAB — COMPREHENSIVE METABOLIC PANEL
ALT: 17 U/L (ref 0–44)
AST: 17 U/L (ref 15–41)
Albumin: 3.6 g/dL (ref 3.5–5.0)
Alkaline Phosphatase: 128 U/L — ABNORMAL HIGH (ref 38–126)
Anion gap: 11 (ref 5–15)
BUN: 27 mg/dL — ABNORMAL HIGH (ref 8–23)
CO2: 23 mmol/L (ref 22–32)
Calcium: 8.6 mg/dL — ABNORMAL LOW (ref 8.9–10.3)
Chloride: 103 mmol/L (ref 98–111)
Creatinine, Ser: 0.98 mg/dL (ref 0.44–1.00)
GFR calc Af Amer: >60 mL/min (ref >60)
GFR calc non Af Amer: >60 mL/min (ref >60)
Glucose, Bld: 96 mg/dL (ref 70–99)
Potassium: 4.5 mmol/L (ref 3.5–5.1)
Sodium: 137 mmol/L (ref 135–145)
Total Bilirubin: 0.5 mg/dL (ref 0.3–1.2)
Total Protein: 7.8 g/dL (ref 6.5–8.1)

## 2019-03-09 LAB — CBC WITH DIFFERENTIAL/PLATELET
Abs Immature Granulocytes: 0.03 10*3/uL (ref 0.00–0.07)
Basophils Absolute: 0.1 10*3/uL (ref 0.0–0.1)
Basophils Relative: 1 %
Eosinophils Absolute: 0.1 10*3/uL (ref 0.0–0.5)
Eosinophils Relative: 2 %
HCT: 40.4 % (ref 36.0–46.0)
Hemoglobin: 12.7 g/dL (ref 12.0–15.0)
Immature Granulocytes: 0 %
Lymphocytes Relative: 20 %
Lymphs Abs: 1.8 10*3/uL (ref 0.7–4.0)
MCH: 30.4 pg (ref 26.0–34.0)
MCHC: 31.4 g/dL (ref 30.0–36.0)
MCV: 96.7 fL (ref 80.0–100.0)
Monocytes Absolute: 0.5 10*3/uL (ref 0.1–1.0)
Monocytes Relative: 5 %
Neutro Abs: 6.4 10*3/uL (ref 1.7–7.7)
Neutrophils Relative %: 72 %
Platelets: 445 10*3/uL — ABNORMAL HIGH (ref 150–400)
RBC: 4.18 MIL/uL (ref 3.87–5.11)
RDW: 15.9 % — ABNORMAL HIGH (ref 11.5–15.5)
WBC: 8.8 10*3/uL (ref 4.0–10.5)
nRBC: 0 % (ref 0.0–0.2)

## 2019-03-09 LAB — IRON AND TIBC
Iron: 32 ug/dL (ref 28–170)
Saturation Ratios: 8 % — ABNORMAL LOW (ref 10.4–31.8)
TIBC: 407 ug/dL (ref 250–450)
UIBC: 375 ug/dL

## 2019-03-09 LAB — VITAMIN B12: Vitamin B-12: 225 pg/mL (ref 180–914)

## 2019-03-09 LAB — FERRITIN: Ferritin: 23 ng/mL (ref 11–307)

## 2019-03-09 LAB — FOLATE: Folate: 6.5 ng/mL (ref >5.9)

## 2019-03-17 ENCOUNTER — Encounter (HOSPITAL_COMMUNITY): Payer: Self-pay | Admitting: Hematology

## 2019-03-17 ENCOUNTER — Other Ambulatory Visit: Payer: Self-pay

## 2019-03-17 ENCOUNTER — Inpatient Hospital Stay (HOSPITAL_COMMUNITY): Payer: Medicare Other | Attending: Hematology | Admitting: Hematology

## 2019-03-17 DIAGNOSIS — Z809 Family history of malignant neoplasm, unspecified: Secondary | ICD-10-CM | POA: Diagnosis not present

## 2019-03-17 DIAGNOSIS — Z7984 Long term (current) use of oral hypoglycemic drugs: Secondary | ICD-10-CM | POA: Insufficient documentation

## 2019-03-17 DIAGNOSIS — Z96653 Presence of artificial knee joint, bilateral: Secondary | ICD-10-CM | POA: Insufficient documentation

## 2019-03-17 DIAGNOSIS — F17210 Nicotine dependence, cigarettes, uncomplicated: Secondary | ICD-10-CM | POA: Insufficient documentation

## 2019-03-17 DIAGNOSIS — E119 Type 2 diabetes mellitus without complications: Secondary | ICD-10-CM | POA: Insufficient documentation

## 2019-03-17 DIAGNOSIS — K219 Gastro-esophageal reflux disease without esophagitis: Secondary | ICD-10-CM | POA: Diagnosis not present

## 2019-03-17 DIAGNOSIS — R05 Cough: Secondary | ICD-10-CM | POA: Insufficient documentation

## 2019-03-17 DIAGNOSIS — Z86010 Personal history of colonic polyps: Secondary | ICD-10-CM | POA: Diagnosis not present

## 2019-03-17 DIAGNOSIS — Z7982 Long term (current) use of aspirin: Secondary | ICD-10-CM | POA: Diagnosis not present

## 2019-03-17 DIAGNOSIS — R918 Other nonspecific abnormal finding of lung field: Secondary | ICD-10-CM | POA: Diagnosis not present

## 2019-03-17 DIAGNOSIS — F329 Major depressive disorder, single episode, unspecified: Secondary | ICD-10-CM | POA: Diagnosis not present

## 2019-03-17 DIAGNOSIS — J449 Chronic obstructive pulmonary disease, unspecified: Secondary | ICD-10-CM | POA: Insufficient documentation

## 2019-03-17 DIAGNOSIS — D509 Iron deficiency anemia, unspecified: Secondary | ICD-10-CM | POA: Diagnosis not present

## 2019-03-17 NOTE — Progress Notes (Signed)
[facility] [place], [place] [id number]   CLINIC:  Medical Oncology/Hematology  PCP:  Lucianne Lei, MD [address] [place] [address] [phone]   REASON FOR VISIT:  Follow-up for iron deficiency anemia   CURRENT THERAPY: Clinical surveillance     INTERVAL HISTORY:  Debbie Mullins 64 y.o. female presents today for follow-up.  Reports overall doing well.  He has had several ER visits since her last office visit due to COPD exasperation.  She is being followed closely by pulmonology.  She was recently started on nasal cannula oxygen.  She reports her fatigue level is stable.  She denies any obvious signs of bleeding.  She continues with reports of chronic cough with sputum production at times.  She is here for repeat labs and follow-up.   REVIEW OF SYSTEMS:  Review of Systems  Constitutional: Positive for fatigue.  HENT:  Negative.   Eyes: Negative.   Respiratory: Positive for cough and shortness of breath.   Cardiovascular: Negative.   Gastrointestinal: Negative.   Endocrine: Negative.   Genitourinary: Negative.    Musculoskeletal: Positive for arthralgias, back pain, gait problem and myalgias.  Skin: Negative.   Neurological: Positive for extremity weakness and gait problem.  Hematological: Negative.   Psychiatric/Behavioral: Negative.      PAST MEDICAL/SURGICAL HISTORY:  Past Medical History:  Diagnosis Date  . Anemia   . Asthma   . CHF (congestive heart failure) (HCC)    takes Furosemide daily   . COPD (chronic obstructive pulmonary disease) (HCC)    Albuterol inhaler prn;SIngulair at night  . Depression    takes Wellbutrin daily  . Diabetes mellitus    takes Metformin daily  . GERD (gastroesophageal reflux disease)    takes Nexium daily  . History of colon polyps    benign  . Hypertension    takes Lisinopril daily  . Iron deficiency anemia 08/01/2017  . Joint pain   . Joint swelling   . Leukocytosis 02/09/2011   . Neck pain    HNP  . Normocytic anemia 02/09/2011  . Peripheral edema    takes Furosemide daily  . Pneumonia    many yrs ago  . Pulmonary nodules/lesions, multiple 08/09/2013  . Shortness of breath dyspnea    with exertion  . Thrombocytosis ([place]) 02/09/2011  . Urinary frequency   . Urinary urgency   . Weakness    numbness and tingling in both hands   Past Surgical History:  Procedure Laterality Date  . ANTERIOR CERVICAL DECOMP/DISCECTOMY FUSION N/A 11/17/2014   Procedure: Cervical four- cervical five Anterior Cervical Decompression with fusion and bonegraft;  Surgeon: Ashok Pall, MD;  Location: [facility];  Service: Neurosurgery;  Laterality: N/A;  C45 anterior cervical decompression with fusion plating and bonegraft  . BIOPSY  02/26/2018   Procedure: BIOPSY;  Surgeon: Danie Binder, MD;  [facility];  Service: Endoscopy;;  (colon) Gastric  duodenal  . COLONOSCOPY    . COLONOSCOPY N/A 02/26/2018   Procedure: COLONOSCOPY;  Surgeon: Danie Binder, MD;  [facility]: [facility];  Service: Endoscopy;  Laterality: N/A;  9:30am  . ESOPHAGOGASTRODUODENOSCOPY N/A 02/26/2018   Procedure: ESOPHAGOGASTRODUODENOSCOPY (EGD);  Surgeon: Danie Binder, MD;  Location: [facility];  Service: Endoscopy;  Laterality: N/A;  . FEMUR IM NAIL Right 05/23/2016  . FEMUR IM NAIL Right 05/23/2016   Procedure: INTRAMEDULLARY (IM) RETROGRADE FEMORAL NAILING;  Surgeon: [name], MD;  Location[facility]  OR;  Service: Orthopedics;  Laterality: Right;  . KNEE ARTHROPLASTY Left 08/10/2013   Procedure:  TOTAL KNEE ARTHROPLASTY;  Surgeon: [name], MD;  Location: [facility];  Service: Orthopedics;  Laterality: Left;  Left Total Knee Arthroplasty Revision, Cemented, Semi-constrained,   . LEFT AND RIGHT HEART CATHETERIZATION WITH CORONARY ANGIOGRAM N/A 01/07/2012   Procedure: LEFT AND RIGHT HEART CATHETERIZATION WITH CORONARY ANGIOGRAM;  Surgeon: Thayer Headings, MD;  Location: [facility] CATH LAB;  Service:  Cardiovascular;  Laterality: N/A;  . TONSILLECTOMY     as a child  . TOTAL KNEE ARTHROPLASTY Bilateral      SOCIAL HISTORY:  Social History   Socioeconomic History  . Marital status: Single    Spouse name: Not on file  . Number of children: Not on file  . Years of education: Not on file  . Highest education level: Not on file  Occupational History  . Not on file  Tobacco Use  . Smoking status: Current Every Day Smoker    Packs/day: 1.00    Years: 41.00    Pack years: 41.00    Types: Cigarettes  . Smokeless tobacco: Never Used  . Tobacco comment: pt currently trying to quit with gum/losenges  Substance and Sexual Activity  . Alcohol use: Yes    Comment: wine  . Drug use: No  . Sexual activity: Never  Other Topics Concern  . Not on file  Social History Narrative   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 COUSINS TRANSPORTS HER.   Social Determinants of Health   Financial Resource Strain:   . Difficulty of Paying Living Expenses: Not on file  Food Insecurity:   . Worried About Charity fundraiser in the Last Year: Not on file  . Ran Out of Food in the Last Year: Not on file  Transportation Needs:   . Lack of Transportation (Medical): Not on file  . Lack of Transportation (Non-Medical): Not on file  Physical Activity:   . Days of Exercise per Week: Not on file  . Minutes of Exercise per Session: Not on file  Stress:   . Feeling of Stress : Not on file  Social Connections:   . Frequency of Communication with Friends and Family: Not on file  . Frequency of Social Gatherings with Friends and Family: Not on file  . Attends Religious Services: Not on file  . Active Member of Clubs or Organizations: Not on file  . Attends Archivist Meetings: Not on file  . Marital Status: Not on file  Intimate Partner Violence:   . Fear of Current or Ex-Partner: Not on file  . Emotionally Abused: Not on file  . Physically Abused: Not on file  .  Sexually Abused: Not on file    FAMILY HISTORY:  Family History  Problem Relation Age of Onset  . Heart failure Mother   . Cancer Maternal Grandmother   . Colon cancer Neg Hx   . Colon polyps Neg Hx     CURRENT MEDICATIONS:  Outpatient Encounter Medications as of 03/17/2019  Medication Sig Note  . amLODipine (NORVASC) 5 MG tablet Take 5 mg by mouth every morning.    Marland Kitchen aspirin EC 325 MG tablet Take 325 mg by mouth every morning.    . celecoxib (CELEBREX) 200 MG capsule Take 200 mg by mouth every morning.    Marland Kitchen esomeprazole (NEXIUM) 20 MG capsule Take 20 mg by mouth daily.   . furosemide (  LASIX) 20 MG tablet Take 1 tablet (20 mg total) by mouth every other day.   . metFORMIN (GLUCOPHAGE) 500 MG tablet Take 1.5 tablets (750 mg total) by mouth 2 (two) times daily with a meal. 03/02/2019: Pt takes 1 tab TID with meals  . montelukast (SINGULAIR) 10 MG tablet Take 10 mg by mouth at bedtime.   Marland Kitchen umeclidinium-vilanterol (ANORO ELLIPTA) 62.5-25 MCG/INH AEPB Inhale 1 puff into the lungs daily.   Marland Kitchen ZORVOLEX 35 MG CAPS Take 1 capsule by mouth 3 (three) times daily.   Marland Kitchen albuterol (VENTOLIN HFA) 108 (90 Base) MCG/ACT inhaler Inhale 1-2 puffs into the lungs every 4 (four) hours as needed for wheezing or shortness of breath. (Patient not taking: Reported on 03/02/2019)   . Clobetasol Prop Emollient Base 0.05 % emollient cream Apply 1 application topically 2 (two) times daily as needed (itching).    . [DISCONTINUED] umeclidinium-vilanterol (ANORO ELLIPTA) 62.5-25 MCG/INH AEPB Inhale 1 puff into the lungs daily.    No facility-administered encounter medications on file as of 03/17/2019.    ALLERGIES:  No Known Allergies   PHYSICAL EXAM:  ECOG Performance status: 1  Vitals:   03/17/19 1215  BP: (!) 109/48  Pulse: (!) 102  Resp: 20  Temp: (!) 97.3 F (36.3 C)  SpO2: 94%   Filed Weights   03/17/19 1215  Weight: 177 lb 12.8 oz (80.6 kg)    Physical Exam Constitutional:      Appearance:  Normal appearance.  HENT:     Head: Normocephalic.     Right Ear: External ear normal.     Left Ear: External ear normal.     Nose: Nose normal.     Mouth/Throat:     Pharynx: Oropharynx is clear.  Eyes:     Conjunctiva/sclera: Conjunctivae normal.  Cardiovascular:     Rate and Rhythm: Regular rhythm. Tachycardia present.     Pulses: Normal pulses.     Heart sounds: Normal heart sounds.  Pulmonary:     Comments: Diminished breath sounds all 4 lobes Abdominal:     General: Abdomen is flat.     Palpations: Abdomen is soft.  Musculoskeletal:     Cervical back: Normal range of motion.     Comments: Decreased range of motion  Skin:    General: Skin is warm.  Neurological:     General: No focal deficit present.     Mental Status: She is alert and oriented to person, place, and time.  Psychiatric:        Mood and Affect: Mood normal.        Behavior: Behavior normal.      LABORATORY DATA:  I have reviewed the labs as listed.  CBC    Component Value Date/Time   WBC 8.8 03/09/2019 1311   RBC 4.18 03/09/2019 1311   HGB 12.7 03/09/2019 1311   HCT 40.4 03/09/2019 1311   PLT 445 (H) 03/09/2019 1311   MCV 96.7 03/09/2019 1311   MCH 30.4 03/09/2019 1311   MCHC 31.4 03/09/2019 1311   RDW 15.9 (H) 03/09/2019 1311   LYMPHSABS 1.8 03/09/2019 1311   MONOABS 0.5 03/09/2019 1311   EOSABS 0.1 03/09/2019 1311   BASOSABS 0.1 03/09/2019 1311   CMP Latest Ref Rng & Units 03/09/2019 01/21/2019 01/20/2019  Glucose 70 - 99 mg/dL 96 107(H) 107(H)  BUN 8 - 23 mg/dL 27(H) 15 15  Creatinine 0.44 - 1.00 mg/dL 0.98 0.84 0.83  Sodium 135 - 145 mmol/L 137 139 134(L)  Potassium 3.5 - 5.1 mmol/L 4.5 4.1 4.2  Chloride 98 - 111 mmol/L 103 100 98  CO2 22 - 32 mmol/L 23 29 24   Calcium 8.9 - 10.3 mg/dL 8.6(L) 8.0(L) 8.1(L)  Total Protein 6.5 - 8.1 g/dL 7.8 - 8.0  Total Bilirubin 0.3 - 1.2 mg/dL 0.5 - 0.6  Alkaline Phos 38 - 126 U/L 128(H) - 125  AST 15 - 41 U/L 17 - 15  ALT 0 - 44 U/L 17 - 13           ASSESSMENT & PLAN:   Iron deficiency anemia 1.  Iron deficiency anemia: - Colonoscopy on 02/26/2017 showed normal ileum, normal colon, internal hemorrhoids. -EGD on 02/26/2018 shows gastritis, large hiatal hernia. -Injectafer on 08/08/2017 and 08/15/2016. -We reviewed blood work from 10/24/2018.  Hemoglobin is 13.1 and MCV is 94.9. -Ferritin is 42, down from 116 in December 2019. -Patient states she is not currently taking iron due to constipation. -Her hemoglobin is stable at 12.7, iron within normal at 32, TIBC 407 saturation slightly low at 8%, ferritin level is 23.  No indication for parenteral iron at this time.  We will continue to monitor. -Return to clinic in 3 months  2.  Lung nodules: -Current active smoker, reports trying to quit and smoking half pack per day.  She started smoking around age 26 and had smoked 1 pack/day most part of her life.  I have recommended smoking cessation. - CT lung cancer screen on 10/24/2018 shows multiple small pulmonary nodules noted in the lungs bilaterally.  Lung RADS 2.  -CT chest on 01/21/2019 reports parenchymal pattern of reticulonodular along with air-trapping.  Differential diagnosis included pneumonitis versus sarcoid.  Also present, enlarged pulmonary trunk concerning for pulmonary arterial hypertension.  She is followed by pulmonology.       [place] [phone]

## 2019-03-17 NOTE — Assessment & Plan Note (Signed)
1.  Iron deficiency anemia: - Colonoscopy on 02/26/2017 showed normal ileum, normal colon, internal hemorrhoids. -EGD on 02/26/2018 shows gastritis, large hiatal hernia. -Injectafer on 08/08/2017 and 08/15/2016. -We reviewed blood work from 10/24/2018.  Hemoglobin is 13.1 and MCV is 94.9. -Ferritin is 42, down from 116 in December 2019. -Patient states she is not currently taking iron due to constipation. -Her hemoglobin is stable at 12.7, iron within normal at 32, TIBC 407 saturation slightly low at 8%, ferritin level is 23.  No indication for parenteral iron at this time.  We will continue to monitor. -Return to clinic in 3 months  2.  Lung nodules: -Current active smoker, reports trying to quit and smoking half pack per day.  She started smoking around age 66 and had smoked 1 pack/day most part of her life.  I have recommended smoking cessation. - CT lung cancer screen on 10/24/2018 shows multiple small pulmonary nodules noted in the lungs bilaterally.  Lung RADS 2.  -CT chest on 01/21/2019 reports parenchymal pattern of reticulonodular along with air-trapping.  Differential diagnosis included pneumonitis versus sarcoid.  Also present, enlarged pulmonary trunk concerning for pulmonary arterial hypertension.  She is followed by pulmonology.

## 2019-03-28 DIAGNOSIS — J449 Chronic obstructive pulmonary disease, unspecified: Secondary | ICD-10-CM | POA: Diagnosis not present

## 2019-04-10 DIAGNOSIS — J441 Chronic obstructive pulmonary disease with (acute) exacerbation: Secondary | ICD-10-CM | POA: Diagnosis not present

## 2019-04-10 DIAGNOSIS — I10 Essential (primary) hypertension: Secondary | ICD-10-CM | POA: Diagnosis not present

## 2019-04-15 ENCOUNTER — Encounter: Payer: Self-pay | Admitting: Pulmonary Disease

## 2019-04-15 ENCOUNTER — Other Ambulatory Visit: Payer: Self-pay

## 2019-04-15 ENCOUNTER — Ambulatory Visit (INDEPENDENT_AMBULATORY_CARE_PROVIDER_SITE_OTHER): Payer: Medicare Other | Admitting: Pulmonary Disease

## 2019-04-15 DIAGNOSIS — J9611 Chronic respiratory failure with hypoxia: Secondary | ICD-10-CM

## 2019-04-15 DIAGNOSIS — J449 Chronic obstructive pulmonary disease, unspecified: Secondary | ICD-10-CM | POA: Diagnosis not present

## 2019-04-15 NOTE — Patient Instructions (Addendum)
COPD, unknown severity - uncontrolled GOLD Class C/D --CONTINUE Anoro ONE puff ONCE a day. This is your EVERYDAY inhaler. --CONTINUE Albuterol as needed. This is your RESCUE inhaler to use as needed for shortness of breath or wheezing  Chronic hypoxemic respiratory failure --Recommend wearing 2L at rest --Recommend wearing 3L with activity and sleep  Return in about 2 months (around 06/13/2019) for follow-up with me.

## 2019-04-15 NOTE — Progress Notes (Signed)
Virtual Visit via Telephone Note  I connected with Debbie Mullins on 04/15/19 at 10:15 AM EST by telephone and verified that I am speaking with the correct person using two identifiers.  Location: Patient: Home Provider: [facility]   I discussed the limitations, risks, security and privacy concerns of performing an evaluation and management service by telephone and the availability of in person appointments. I also discussed with the patient that there may be a patient responsible charge related to this service. The patient expressed understanding and agreed to proceed.   I discussed the assessment and treatment plan with the patient. The patient was provided an opportunity to ask questions and all were answered. The patient agreed with the plan and demonstrated an understanding of the instructions.   The patient was advised to call back or seek an in-person evaluation if the symptoms worsen or if the condition fails to improve as anticipated.  I provided 22 minutes of non-face-to-face time during this encounter.   Na Debbie Rodman Pickle, MD   Subjective:   PATIENT ID: Debbie Mullins GENDER: female DOB: [birth year], MRN: [id number]   HPI  Chief Complaint  Patient presents with  . Follow-up    hasn't been on ANORO due to insurance, being mailed out 04/15/19- call EMS1/31/21 better since treatment    Reason for Visit: Telephone follow-up  Ms. Sheral Spoelstra is a 64 year old female active smoker who presents for COPD follow-up.  Synopsis: She has had multiple hospitalizations in October and November and recently discharged for COPD exacerbation. She was seen by Pulmonary consult team for abnormal CT Chest possibly concerning for hypersensitivity pneumonitis however no risk factors. She was discharged with oxygen and steroids.   On her last appointment, she was started Anoro but did not ask for any refills after completing the samples. She felt relief with the  inhaler with improved activity and less SOB, cough and wheezing but noticed she had worsening shortness of breath off the inhaler. Three days ago, she called EMS for her symptoms when her nebulizer broke down. EMS came out and gave her a breathing treatment and fixed her nebulizer. Denies any active issues. She is getting Anoro today. Usually uses her albuterol once a day. Has been compliant with her home oxygen.   Social History: 41 pack-years. Quit 01/2019.  I have personally reviewed patient's past medical/family/social history/allergies/current medications.   Past Medical History:  Diagnosis Date  . Anemia   . Asthma   . CHF (congestive heart failure) (HCC)    takes Furosemide daily   . COPD (chronic obstructive pulmonary disease) (HCC)    Albuterol inhaler prn;SIngulair at night  . Depression    takes Wellbutrin daily  . Diabetes mellitus    takes Metformin daily  . GERD (gastroesophageal reflux disease)    takes Nexium daily  . History of colon polyps    benign  . Hypertension    takes Lisinopril daily  . Iron deficiency anemia 08/01/2017  . Joint pain   . Joint swelling   . Leukocytosis 02/09/2011  . Neck pain    HNP  . Normocytic anemia 02/09/2011  . Peripheral edema    takes Furosemide daily  . Pneumonia    many yrs ago  . Pulmonary nodules/lesions, multiple 08/09/2013  . Shortness of breath dyspnea    with exertion  . Thrombocytosis ([place]) 02/09/2011  . Urinary frequency   . Urinary urgency   . Weakness    numbness and tingling in  both hands     No Known Allergies   Outpatient Medications Prior to Visit  Medication Sig Dispense Refill  . albuterol (VENTOLIN HFA) 108 (90 Base) MCG/ACT inhaler Inhale 1-2 puffs into the lungs every 4 (four) hours as needed for wheezing or shortness of breath. (Patient not taking: Reported on 03/02/2019) 8 g 1  . amLODipine (NORVASC) 5 MG tablet Take 5 mg by mouth every morning.     Marland Kitchen aspirin EC 325 MG tablet Take 325 mg by  mouth every morning.     . celecoxib (CELEBREX) 200 MG capsule Take 200 mg by mouth every morning.     . Clobetasol Prop Emollient Base 0.05 % emollient cream Apply 1 application topically 2 (two) times daily as needed (itching).     Marland Kitchen esomeprazole (NEXIUM) 20 MG capsule Take 20 mg by mouth daily.    . furosemide (LASIX) 20 MG tablet Take 1 tablet (20 mg total) by mouth every other day. 30 tablet 11  . metFORMIN (GLUCOPHAGE) 500 MG tablet Take 1.5 tablets (750 mg total) by mouth 2 (two) times daily with a meal.    . montelukast (SINGULAIR) 10 MG tablet Take 10 mg by mouth at bedtime.    Marland Kitchen umeclidinium-vilanterol (ANORO ELLIPTA) 62.5-25 MCG/INH AEPB Inhale 1 puff into the lungs daily. 4 each 0  . ZORVOLEX 35 MG CAPS Take 1 capsule by mouth 3 (three) times daily.     No facility-administered medications prior to visit.    Review of Systems  Constitutional: Negative for chills, diaphoresis, fever, malaise/fatigue and weight loss.  HENT: Negative for congestion.   Respiratory: Positive for shortness of breath. Negative for cough, hemoptysis, sputum production and wheezing.   Cardiovascular: Negative for chest pain, palpitations and leg swelling.     Objective:   There were no vitals filed for this visit.   Physical Exam: No audible distress No wheezing  Data Reviewed:  Imaging: CT Chest 01/21/19 - Parenchyma with mosaic attenuation and reticulonodular pattern bilaterally. Stable benign pulmonary nodules   PFT: None on file  Ambulatory O2 03/02/19 DME: [employer] Patient Saturations on Room Air at Rest = 84% Patient Saturations on 3 Liters of oxygen while Ambulating = 93% CONCLUSION: --Recommend wearing 2L at rest --Recommend wearing 3L with activity and sleep  Imaging, labs and tests noted above have been reviewed independently by me.    Assessment & Plan:   Discussion:  64 year old female active smoker who presents for telephone follow-up. Two hospitalizations in 2020 for  COPD exacerbation and EMS home visit in Jan 2021 for COPD. Symptoms improved on LAMA/LABA and worsen off maintenance inhalers. Strongly encourage patient to contact us to prevent gap in treatment.  COPD, unknown severity - uncontrolled GOLD Class C/D --CONTINUE Anoro ONE puff ONCE a day. This is your EVERYDAY inhaler. --CONTINUE Albuterol as needed. This is your RESCUE inhaler to use as needed for shortness of breath or wheezing  Chronic hypoxemic respiratory failure --Recommend wearing 2L at rest --Recommend wearing 3L with activity and sleep  We will arrange for pulmonary function tests in the future.   Health Maintenance Immunization History  Administered Date(s) Administered  . Influenza Split 01/22/2016  . Influenza,inj,Quad PF,6+ Mos 11/18/2014, 01/26/2018, 12/27/2018  . Influenza,trivalent, recombinat, inj, PF 01/06/2017  . Influenza-Unspecified 03/12/2014  . Pneumococcal Polysaccharide-23 08/12/2013   CT Lung Screen - not due until 01/2020  No orders of the defined types were placed in this encounter.  No orders of the defined types were  placed in this encounter.  Return in about 2 months (around 06/13/2019) for follow-up with me.  [place], MD [facility] 04/15/2019 9:05 AM  Office Number [phone]

## 2019-04-25 ENCOUNTER — Ambulatory Visit: Payer: Medicare Other | Attending: Internal Medicine

## 2019-04-25 ENCOUNTER — Ambulatory Visit: Payer: Medicare Other

## 2019-04-25 DIAGNOSIS — Z23 Encounter for immunization: Secondary | ICD-10-CM | POA: Insufficient documentation

## 2019-04-25 NOTE — Progress Notes (Signed)
   Covid-19 Vaccination Clinic  Name:  JANAYE GYAMFI    MRN: [id number] DOB: [birth year]  04/25/2019  Ms. Merante was observed post Covid-19 immunization for 15 minutes without incidence. She was provided with Vaccine Information Sheet and instruction to access the V-Safe system.   Ms. Kempa was instructed to call 911 with any severe reactions post vaccine: Marland Kitchen Difficulty breathing  . Swelling of your face and throat  . A fast heartbeat  . A bad rash all over your body  . Dizziness and weakness    Immunizations Administered    Name Date Dose VIS Date Route   Moderna COVID-19 Vaccine 04/25/2019 11:45 AM 0.5 mL 02/10/2019 Intramuscular   Manufacturer: Moderna   Lot: [id number]   [place]:[id number]

## 2019-04-28 DIAGNOSIS — J449 Chronic obstructive pulmonary disease, unspecified: Secondary | ICD-10-CM | POA: Diagnosis not present

## 2019-05-10 ENCOUNTER — Other Ambulatory Visit: Payer: Self-pay

## 2019-05-10 ENCOUNTER — Ambulatory Visit: Payer: Medicare Other | Attending: Internal Medicine

## 2019-05-10 DIAGNOSIS — Z23 Encounter for immunization: Secondary | ICD-10-CM | POA: Insufficient documentation

## 2019-05-10 NOTE — Progress Notes (Signed)
   Covid-19 Vaccination Clinic  Name:  Debbie Mullins    MRN: [id number] DOB: [birth year]  05/10/2019  Debbie Mullins was observed post Covid-19 immunization for 15 minutes without incidence. She was provided with Vaccine Information Sheet and instruction to access the V-Safe system.   Debbie Mullins was instructed to call 911 with any severe reactions post vaccine: Marland Kitchen Difficulty breathing  . Swelling of your face and throat  . A fast heartbeat  . A bad rash all over your body  . Dizziness and weakness    Immunizations Administered    Name Date Dose VIS Date Route   Moderna COVID-19 Vaccine 05/10/2019 12:53 PM 0.5 mL 02/10/2019 Intramuscular   Manufacturer: Moderna   Lot: [id number]   [place]:[id number]

## 2019-05-23 ENCOUNTER — Ambulatory Visit: Payer: Medicare Other

## 2019-05-25 DIAGNOSIS — J441 Chronic obstructive pulmonary disease with (acute) exacerbation: Secondary | ICD-10-CM | POA: Diagnosis not present

## 2019-05-25 DIAGNOSIS — R609 Edema, unspecified: Secondary | ICD-10-CM | POA: Diagnosis not present

## 2019-05-25 DIAGNOSIS — I10 Essential (primary) hypertension: Secondary | ICD-10-CM | POA: Diagnosis not present

## 2019-05-25 DIAGNOSIS — I110 Hypertensive heart disease with heart failure: Secondary | ICD-10-CM | POA: Diagnosis not present

## 2019-05-26 DIAGNOSIS — J449 Chronic obstructive pulmonary disease, unspecified: Secondary | ICD-10-CM | POA: Diagnosis not present

## 2019-05-27 DIAGNOSIS — E1169 Type 2 diabetes mellitus with other specified complication: Secondary | ICD-10-CM | POA: Diagnosis not present

## 2019-05-27 DIAGNOSIS — E875 Hyperkalemia: Secondary | ICD-10-CM | POA: Diagnosis not present

## 2019-05-27 DIAGNOSIS — I10 Essential (primary) hypertension: Secondary | ICD-10-CM | POA: Diagnosis not present

## 2019-05-27 DIAGNOSIS — R609 Edema, unspecified: Secondary | ICD-10-CM | POA: Diagnosis not present

## 2019-06-03 DIAGNOSIS — I10 Essential (primary) hypertension: Secondary | ICD-10-CM | POA: Diagnosis not present

## 2019-06-03 DIAGNOSIS — I110 Hypertensive heart disease with heart failure: Secondary | ICD-10-CM | POA: Diagnosis not present

## 2019-06-03 DIAGNOSIS — R609 Edema, unspecified: Secondary | ICD-10-CM | POA: Diagnosis not present

## 2019-06-03 DIAGNOSIS — E785 Hyperlipidemia, unspecified: Secondary | ICD-10-CM | POA: Diagnosis not present

## 2019-06-04 DIAGNOSIS — I10 Essential (primary) hypertension: Secondary | ICD-10-CM | POA: Diagnosis not present

## 2019-06-04 DIAGNOSIS — J449 Chronic obstructive pulmonary disease, unspecified: Secondary | ICD-10-CM | POA: Diagnosis not present

## 2019-06-04 DIAGNOSIS — M1250 Traumatic arthropathy, unspecified site: Secondary | ICD-10-CM | POA: Diagnosis not present

## 2019-06-04 DIAGNOSIS — E119 Type 2 diabetes mellitus without complications: Secondary | ICD-10-CM | POA: Diagnosis not present

## 2019-06-08 ENCOUNTER — Telehealth: Payer: Self-pay | Admitting: Pulmonary Disease

## 2019-06-08 MED ORDER — ANORO ELLIPTA 62.5-25 MCG/INH IN AEPB: 1.0000 | INHALATION_SPRAY | Freq: Every day | RESPIRATORY_TRACT | 5 refills | Status: DC

## 2019-06-08 NOTE — Telephone Encounter (Signed)
Called and spoke with pt stating to her that we needed to send rx to pharmacy for her of anoro since one has never been sent in and she verbalized understanding. Verified pt's preferred pharmacy and sent rx to pharmacy for pt. Nothing further needed.

## 2019-06-09 ENCOUNTER — Telehealth: Payer: Self-pay | Admitting: Pulmonary Disease

## 2019-06-09 NOTE — Telephone Encounter (Signed)
Called pt's pharmacy to see why pt cannot get her anoro rx until 5/31 and they said it was due to insurance reasons. They asked if pt might have gotten rx filled with mail order pharmacy [facility] and I stated that I was unsure.  Called [facility] and spoke with Rica Mote to see if pt recently had rx for anoro filled with them and he stated that rx was filled 3/24 and was delivered to pt's address on 3/27 for a 90-day supply. This is why pt is unable to get rx filled at local pharmacy.  Called and spoke with pt letting her know this info. Pt stated that she has not received her anoro from [facility]. I provided pt with the phone number for [facility] so she can contact to check on status. Nothing further needed.

## 2019-06-11 ENCOUNTER — Other Ambulatory Visit (HOSPITAL_COMMUNITY): Payer: Self-pay | Admitting: *Deleted

## 2019-06-11 DIAGNOSIS — E538 Deficiency of other specified B group vitamins: Secondary | ICD-10-CM

## 2019-06-11 DIAGNOSIS — R918 Other nonspecific abnormal finding of lung field: Secondary | ICD-10-CM

## 2019-06-11 DIAGNOSIS — D509 Iron deficiency anemia, unspecified: Secondary | ICD-10-CM

## 2019-06-13 ENCOUNTER — Ambulatory Visit: Payer: Medicare Other | Attending: Internal Medicine

## 2019-06-13 DIAGNOSIS — Z23 Encounter for immunization: Secondary | ICD-10-CM

## 2019-06-13 NOTE — Progress Notes (Signed)
   Covid-19 Vaccination Clinic  Name:  Debbie Mullins    MRN: [id number] DOB: 1955/05/23  06/13/2019  [name] was observed post Covid-19 immunization for 15 minutes without incident. She was provided with Vaccine Information Sheet and instruction to access the V-Safe system.   [name] was instructed to call 911 with any severe reactions post vaccine: Marland Kitchen Difficulty breathing  . Swelling of face and throat  . A fast heartbeat  . A bad rash all over body  . Dizziness and weakness   Immunizations Administered    Name Date Dose VIS Date Route   Moderna COVID-19 Vaccine 06/13/2019 12:47 PM 0.5 mL 02/10/2019 Intramuscular   Manufacturer: Moderna   Lot: [id number]   [id number]

## 2019-06-15 ENCOUNTER — Inpatient Hospital Stay (HOSPITAL_COMMUNITY): Payer: Medicare Other | Attending: Hematology

## 2019-06-17 DIAGNOSIS — E875 Hyperkalemia: Secondary | ICD-10-CM | POA: Diagnosis not present

## 2019-06-17 DIAGNOSIS — E119 Type 2 diabetes mellitus without complications: Secondary | ICD-10-CM | POA: Diagnosis not present

## 2019-06-17 DIAGNOSIS — I10 Essential (primary) hypertension: Secondary | ICD-10-CM | POA: Diagnosis not present

## 2019-06-17 DIAGNOSIS — J441 Chronic obstructive pulmonary disease with (acute) exacerbation: Secondary | ICD-10-CM | POA: Diagnosis not present

## 2019-06-17 DIAGNOSIS — I110 Hypertensive heart disease with heart failure: Secondary | ICD-10-CM | POA: Diagnosis not present

## 2019-06-17 DIAGNOSIS — R0902 Hypoxemia: Secondary | ICD-10-CM | POA: Diagnosis not present

## 2019-06-19 ENCOUNTER — Ambulatory Visit (HOSPITAL_COMMUNITY): Payer: Medicare Other | Admitting: Nurse Practitioner

## 2019-06-26 DIAGNOSIS — J449 Chronic obstructive pulmonary disease, unspecified: Secondary | ICD-10-CM | POA: Diagnosis not present

## 2019-07-22 DIAGNOSIS — R69 Illness, unspecified: Secondary | ICD-10-CM | POA: Diagnosis not present

## 2019-07-22 DIAGNOSIS — W19XXXA Unspecified fall, initial encounter: Secondary | ICD-10-CM | POA: Diagnosis not present

## 2019-07-24 DIAGNOSIS — R69 Illness, unspecified: Secondary | ICD-10-CM | POA: Diagnosis not present

## 2019-07-24 DIAGNOSIS — R5381 Other malaise: Secondary | ICD-10-CM | POA: Diagnosis not present

## 2019-07-25 ENCOUNTER — Encounter (HOSPITAL_COMMUNITY): Payer: Self-pay

## 2019-07-25 ENCOUNTER — Inpatient Hospital Stay (HOSPITAL_COMMUNITY)
Admission: EM | Admit: 2019-07-25 | Discharge: 2019-07-29 | DRG: 291 | Disposition: A | Discharge status: Skilled Nursing Facility | Payer: Medicare Other | Attending: Internal Medicine | Admitting: Internal Medicine

## 2019-07-25 ENCOUNTER — Other Ambulatory Visit: Payer: Self-pay

## 2019-07-25 ENCOUNTER — Emergency Department (HOSPITAL_COMMUNITY): Payer: Medicare Other

## 2019-07-25 DIAGNOSIS — E1122 Type 2 diabetes mellitus with diabetic chronic kidney disease: Secondary | ICD-10-CM | POA: Diagnosis not present

## 2019-07-25 DIAGNOSIS — L89322 Pressure ulcer of left buttock, stage 2: Secondary | ICD-10-CM | POA: Diagnosis present

## 2019-07-25 DIAGNOSIS — Z791 Long term (current) use of non-steroidal anti-inflammatories (NSAID): Secondary | ICD-10-CM

## 2019-07-25 DIAGNOSIS — E669 Obesity, unspecified: Secondary | ICD-10-CM | POA: Diagnosis present

## 2019-07-25 DIAGNOSIS — Z9981 Dependence on supplemental oxygen: Secondary | ICD-10-CM

## 2019-07-25 DIAGNOSIS — F329 Major depressive disorder, single episode, unspecified: Secondary | ICD-10-CM | POA: Diagnosis present

## 2019-07-25 DIAGNOSIS — N1832 Chronic kidney disease, stage 3b: Secondary | ICD-10-CM | POA: Diagnosis not present

## 2019-07-25 DIAGNOSIS — Z7982 Long term (current) use of aspirin: Secondary | ICD-10-CM

## 2019-07-25 DIAGNOSIS — F17210 Nicotine dependence, cigarettes, uncomplicated: Secondary | ICD-10-CM | POA: Diagnosis present

## 2019-07-25 DIAGNOSIS — E118 Type 2 diabetes mellitus with unspecified complications: Secondary | ICD-10-CM | POA: Diagnosis not present

## 2019-07-25 DIAGNOSIS — I509 Heart failure, unspecified: Secondary | ICD-10-CM

## 2019-07-25 DIAGNOSIS — J449 Chronic obstructive pulmonary disease, unspecified: Secondary | ICD-10-CM | POA: Diagnosis not present

## 2019-07-25 DIAGNOSIS — R0602 Shortness of breath: Secondary | ICD-10-CM

## 2019-07-25 DIAGNOSIS — E1121 Type 2 diabetes mellitus with diabetic nephropathy: Secondary | ICD-10-CM | POA: Diagnosis not present

## 2019-07-25 DIAGNOSIS — Z7984 Long term (current) use of oral hypoglycemic drugs: Secondary | ICD-10-CM

## 2019-07-25 DIAGNOSIS — E785 Hyperlipidemia, unspecified: Secondary | ICD-10-CM | POA: Diagnosis not present

## 2019-07-25 DIAGNOSIS — I110 Hypertensive heart disease with heart failure: Secondary | ICD-10-CM | POA: Diagnosis not present

## 2019-07-25 DIAGNOSIS — I130 Hypertensive heart and chronic kidney disease with heart failure and stage 1 through stage 4 chronic kidney disease, or unspecified chronic kidney disease: Secondary | ICD-10-CM | POA: Diagnosis not present

## 2019-07-25 DIAGNOSIS — Z743 Need for continuous supervision: Secondary | ICD-10-CM | POA: Diagnosis not present

## 2019-07-25 DIAGNOSIS — Z20822 Contact with and (suspected) exposure to covid-19: Secondary | ICD-10-CM | POA: Diagnosis present

## 2019-07-25 DIAGNOSIS — N1831 Chronic kidney disease, stage 3a: Secondary | ICD-10-CM | POA: Diagnosis not present

## 2019-07-25 DIAGNOSIS — Z91138 Patient's unintentional underdosing of medication regimen for other reason: Secondary | ICD-10-CM | POA: Diagnosis not present

## 2019-07-25 DIAGNOSIS — J441 Chronic obstructive pulmonary disease with (acute) exacerbation: Secondary | ICD-10-CM | POA: Diagnosis present

## 2019-07-25 DIAGNOSIS — Z79899 Other long term (current) drug therapy: Secondary | ICD-10-CM

## 2019-07-25 DIAGNOSIS — M6281 Muscle weakness (generalized): Secondary | ICD-10-CM | POA: Diagnosis not present

## 2019-07-25 DIAGNOSIS — N181 Chronic kidney disease, stage 1: Secondary | ICD-10-CM

## 2019-07-25 DIAGNOSIS — I272 Pulmonary hypertension, unspecified: Secondary | ICD-10-CM | POA: Diagnosis not present

## 2019-07-25 DIAGNOSIS — Z6839 Body mass index (BMI) 39.0-39.9, adult: Secondary | ICD-10-CM

## 2019-07-25 DIAGNOSIS — Z720 Tobacco use: Secondary | ICD-10-CM

## 2019-07-25 DIAGNOSIS — I10 Essential (primary) hypertension: Secondary | ICD-10-CM

## 2019-07-25 DIAGNOSIS — Z86010 Personal history of colonic polyps: Secondary | ICD-10-CM | POA: Diagnosis not present

## 2019-07-25 DIAGNOSIS — I5033 Acute on chronic diastolic (congestive) heart failure: Secondary | ICD-10-CM | POA: Diagnosis not present

## 2019-07-25 DIAGNOSIS — R7989 Other specified abnormal findings of blood chemistry: Secondary | ICD-10-CM | POA: Diagnosis not present

## 2019-07-25 DIAGNOSIS — R5381 Other malaise: Secondary | ICD-10-CM | POA: Diagnosis not present

## 2019-07-25 DIAGNOSIS — R531 Weakness: Secondary | ICD-10-CM | POA: Diagnosis not present

## 2019-07-25 DIAGNOSIS — I5032 Chronic diastolic (congestive) heart failure: Secondary | ICD-10-CM | POA: Diagnosis not present

## 2019-07-25 DIAGNOSIS — K219 Gastro-esophageal reflux disease without esophagitis: Secondary | ICD-10-CM | POA: Diagnosis not present

## 2019-07-25 DIAGNOSIS — Z8249 Family history of ischemic heart disease and other diseases of the circulatory system: Secondary | ICD-10-CM | POA: Diagnosis not present

## 2019-07-25 DIAGNOSIS — I071 Rheumatic tricuspid insufficiency: Secondary | ICD-10-CM | POA: Diagnosis present

## 2019-07-25 DIAGNOSIS — Z981 Arthrodesis status: Secondary | ICD-10-CM

## 2019-07-25 DIAGNOSIS — R0689 Other abnormalities of breathing: Secondary | ICD-10-CM | POA: Diagnosis not present

## 2019-07-25 DIAGNOSIS — R2689 Other abnormalities of gait and mobility: Secondary | ICD-10-CM | POA: Diagnosis not present

## 2019-07-25 DIAGNOSIS — E6609 Other obesity due to excess calories: Secondary | ICD-10-CM | POA: Diagnosis not present

## 2019-07-25 DIAGNOSIS — T501X6A Underdosing of loop [high-ceiling] diuretics, initial encounter: Secondary | ICD-10-CM | POA: Diagnosis not present

## 2019-07-25 DIAGNOSIS — L8990 Pressure ulcer of unspecified site, unspecified stage: Secondary | ICD-10-CM | POA: Insufficient documentation

## 2019-07-25 DIAGNOSIS — J9621 Acute and chronic respiratory failure with hypoxia: Secondary | ICD-10-CM | POA: Diagnosis not present

## 2019-07-25 DIAGNOSIS — R069 Unspecified abnormalities of breathing: Secondary | ICD-10-CM | POA: Diagnosis not present

## 2019-07-25 DIAGNOSIS — Z7401 Bed confinement status: Secondary | ICD-10-CM | POA: Diagnosis not present

## 2019-07-25 LAB — COMPREHENSIVE METABOLIC PANEL
ALT: 16 U/L (ref 0–44)
AST: 15 U/L (ref 15–41)
Albumin: 3.5 g/dL (ref 3.5–5.0)
Alkaline Phosphatase: 187 U/L — ABNORMAL HIGH (ref 38–126)
Anion gap: 13 (ref 5–15)
BUN: 24 mg/dL — ABNORMAL HIGH (ref 8–23)
CO2: 26 mmol/L (ref 22–32)
Calcium: 8.3 mg/dL — ABNORMAL LOW (ref 8.9–10.3)
Chloride: 93 mmol/L — ABNORMAL LOW (ref 98–111)
Creatinine, Ser: 1.17 mg/dL — ABNORMAL HIGH (ref 0.44–1.00)
GFR calc Af Amer: 58 mL/min — ABNORMAL LOW (ref >60)
GFR calc non Af Amer: 50 mL/min — ABNORMAL LOW (ref >60)
Glucose, Bld: 97 mg/dL (ref 70–99)
Potassium: 4.9 mmol/L (ref 3.5–5.1)
Sodium: 132 mmol/L — ABNORMAL LOW (ref 135–145)
Total Bilirubin: 0.9 mg/dL (ref 0.3–1.2)
Total Protein: 7.8 g/dL (ref 6.5–8.1)

## 2019-07-25 LAB — GLUCOSE, CAPILLARY
Glucose-Capillary: 81 mg/dL (ref 70–99)
Glucose-Capillary: 104 mg/dL — ABNORMAL HIGH (ref 70–99)

## 2019-07-25 LAB — MAGNESIUM: Magnesium: 1.3 mg/dL — ABNORMAL LOW (ref 1.7–2.4)

## 2019-07-25 LAB — CBC WITH DIFFERENTIAL/PLATELET
Abs Immature Granulocytes: 0.03 10*3/uL (ref 0.00–0.07)
Basophils Absolute: 0 10*3/uL (ref 0.0–0.1)
Basophils Relative: 1 %
Eosinophils Absolute: 0 10*3/uL (ref 0.0–0.5)
Eosinophils Relative: 0 %
HCT: 36.8 % (ref 36.0–46.0)
Hemoglobin: 10.8 g/dL — ABNORMAL LOW (ref 12.0–15.0)
Immature Granulocytes: 1 %
Lymphocytes Relative: 15 %
Lymphs Abs: 0.9 10*3/uL (ref 0.7–4.0)
MCH: 24.8 pg — ABNORMAL LOW (ref 26.0–34.0)
MCHC: 29.3 g/dL — ABNORMAL LOW (ref 30.0–36.0)
MCV: 84.6 fL (ref 80.0–100.0)
Monocytes Absolute: 0.3 10*3/uL (ref 0.1–1.0)
Monocytes Relative: 5 %
Neutro Abs: 5 10*3/uL (ref 1.7–7.7)
Neutrophils Relative %: 78 %
Platelets: 274 10*3/uL (ref 150–400)
RBC: 4.35 MIL/uL (ref 3.87–5.11)
RDW: 20.4 % — ABNORMAL HIGH (ref 11.5–15.5)
WBC: 6.3 10*3/uL (ref 4.0–10.5)
nRBC: 0 % (ref 0.0–0.2)

## 2019-07-25 LAB — TSH: TSH: 1.059 u[IU]/mL (ref 0.350–4.500)

## 2019-07-25 LAB — BRAIN NATRIURETIC PEPTIDE: B Natriuretic Peptide: 805 pg/mL — ABNORMAL HIGH (ref 0.0–100.0)

## 2019-07-25 LAB — SARS CORONAVIRUS 2 BY RT PCR (HOSPITAL ORDER, PERFORMED IN ~~LOC~~ HOSPITAL LAB): SARS Coronavirus 2: NEGATIVE

## 2019-07-25 MED ORDER — IOHEXOL 350 MG/ML SOLN
100.0000 mL | Freq: Once | INTRAVENOUS | Status: AC | PRN
  Administered 2019-07-25: 100 mL via INTRAVENOUS

## 2019-07-25 MED ORDER — INSULIN ASPART 100 UNIT/ML ~~LOC~~ SOLN
0.0000 [IU] | Freq: Three times a day (TID) | SUBCUTANEOUS | Status: DC
  Administered 2019-07-26: 2 [IU] via SUBCUTANEOUS
  Administered 2019-07-27: 1 [IU] via SUBCUTANEOUS
  Administered 2019-07-27: 3 [IU] via SUBCUTANEOUS
  Administered 2019-07-28 (×2): 2 [IU] via SUBCUTANEOUS
  Administered 2019-07-29: 1 [IU] via SUBCUTANEOUS

## 2019-07-25 MED ORDER — ARFORMOTEROL TARTRATE 15 MCG/2ML IN NEBU
15.0000 ug | INHALATION_SOLUTION | Freq: Two times a day (BID) | RESPIRATORY_TRACT | Status: DC
  Administered 2019-07-25 – 2019-07-29 (×8): 15 ug via RESPIRATORY_TRACT
  Filled 2019-07-25 (×8): qty 2

## 2019-07-25 MED ORDER — PREDNISONE 20 MG PO TABS
40.0000 mg | ORAL_TABLET | Freq: Every day | ORAL | Status: DC
  Administered 2019-07-26 – 2019-07-29 (×4): 40 mg via ORAL
  Filled 2019-07-25 (×5): qty 2

## 2019-07-25 MED ORDER — SODIUM CHLORIDE 0.9 % IV SOLN: 250.0000 mL | INTRAVENOUS | Status: DC | PRN

## 2019-07-25 MED ORDER — ONDANSETRON HCL 4 MG/2ML IJ SOLN: 4.0000 mg | Freq: Four times a day (QID) | INTRAMUSCULAR | Status: DC | PRN

## 2019-07-25 MED ORDER — PANTOPRAZOLE SODIUM 40 MG PO TBEC
40.0000 mg | DELAYED_RELEASE_TABLET | Freq: Two times a day (BID) | ORAL | Status: DC
  Administered 2019-07-25 – 2019-07-29 (×8): 40 mg via ORAL
  Filled 2019-07-25 (×9): qty 1

## 2019-07-25 MED ORDER — IPRATROPIUM-ALBUTEROL 0.5-2.5 (3) MG/3ML IN SOLN
3.0000 mL | Freq: Four times a day (QID) | RESPIRATORY_TRACT | Status: DC
  Administered 2019-07-25 (×2): 3 mL via RESPIRATORY_TRACT
  Filled 2019-07-25 (×2): qty 3

## 2019-07-25 MED ORDER — BUDESONIDE 0.5 MG/2ML IN SUSP
0.5000 mg | Freq: Two times a day (BID) | RESPIRATORY_TRACT | Status: DC
  Administered 2019-07-25 – 2019-07-29 (×8): 0.5 mg via RESPIRATORY_TRACT
  Filled 2019-07-25 (×8): qty 2

## 2019-07-25 MED ORDER — SODIUM CHLORIDE 0.9% FLUSH
3.0000 mL | Freq: Two times a day (BID) | INTRAVENOUS | Status: DC
  Administered 2019-07-25 – 2019-07-29 (×8): 3 mL via INTRAVENOUS

## 2019-07-25 MED ORDER — FUROSEMIDE 10 MG/ML IJ SOLN
80.0000 mg | Freq: Once | INTRAMUSCULAR | Status: AC
  Administered 2019-07-25: 80 mg via INTRAVENOUS
  Filled 2019-07-25: qty 8

## 2019-07-25 MED ORDER — ALBUTEROL SULFATE HFA 108 (90 BASE) MCG/ACT IN AERS
2.0000 | INHALATION_SPRAY | Freq: Once | RESPIRATORY_TRACT | Status: AC
  Administered 2019-07-25: 2 via RESPIRATORY_TRACT
  Filled 2019-07-25: qty 6.7

## 2019-07-25 MED ORDER — HEPARIN SODIUM (PORCINE) 5000 UNIT/ML IJ SOLN
5000.0000 [IU] | Freq: Three times a day (TID) | INTRAMUSCULAR | Status: DC
  Administered 2019-07-25 – 2019-07-29 (×13): 5000 [IU] via SUBCUTANEOUS
  Filled 2019-07-25 (×13): qty 1

## 2019-07-25 MED ORDER — IPRATROPIUM-ALBUTEROL 0.5-2.5 (3) MG/3ML IN SOLN
3.0000 mL | Freq: Two times a day (BID) | RESPIRATORY_TRACT | Status: DC
  Administered 2019-07-26 – 2019-07-29 (×7): 3 mL via RESPIRATORY_TRACT
  Filled 2019-07-25 (×7): qty 3

## 2019-07-25 MED ORDER — ASPIRIN 325 MG PO TABS
325.0000 mg | ORAL_TABLET | Freq: Every day | ORAL | Status: DC
  Administered 2019-07-26 – 2019-07-29 (×4): 325 mg via ORAL
  Filled 2019-07-25 (×5): qty 1

## 2019-07-25 MED ORDER — SODIUM CHLORIDE 0.9% FLUSH
3.0000 mL | INTRAVENOUS | Status: DC | PRN
  Administered 2019-07-25: 3 mL via INTRAVENOUS

## 2019-07-25 MED ORDER — FUROSEMIDE 10 MG/ML IJ SOLN
40.0000 mg | Freq: Two times a day (BID) | INTRAMUSCULAR | Status: DC
  Administered 2019-07-25 – 2019-07-29 (×8): 40 mg via INTRAVENOUS
  Filled 2019-07-25 (×8): qty 4

## 2019-07-25 MED ORDER — ACETAMINOPHEN 325 MG PO TABS
650.0000 mg | ORAL_TABLET | ORAL | Status: DC | PRN
  Administered 2019-07-26 – 2019-07-29 (×8): 650 mg via ORAL
  Filled 2019-07-25 (×8): qty 2

## 2019-07-25 MED ORDER — DOXYCYCLINE HYCLATE 100 MG PO TABS
100.0000 mg | ORAL_TABLET | Freq: Two times a day (BID) | ORAL | Status: DC
  Administered 2019-07-25 – 2019-07-29 (×8): 100 mg via ORAL
  Filled 2019-07-25 (×9): qty 1

## 2019-07-25 NOTE — ED Provider Notes (Signed)
[facility] EMERGENCY DEPARTMENT Provider Note   CSN: [id number] Arrival date & time: 07/25/19  1030     History Chief Complaint  Patient presents with  . Shortness of Breath    Debbie Mullins is a 64 y.o. female.  Patient brought in by EMS for shortness of breath and hypoxia.  Patient normally on 3 L of oxygen at all times has a history of COPD and somewhat of a remote history of CHF.  Her oxygen saturation on 3 L nasal cannula was 60%.  The started on nonrebreather.  Oxygen sats increased up to 98%.  Patient supposed to be on Lasix but has run out of that.  She states that her doctor had not called it in.  She does have increased leg swelling.  And the left leg has some weeping.  She is always had swollen legs but it is worse.  Patient has had the Covid vaccines the second 1 was about 2-3 weeks ago.  No chest pain.  Patient does not feel like she is wheezing.        Past Medical History:  Diagnosis Date  . Anemia   . Asthma   . CHF (congestive heart failure) (HCC)    takes Furosemide daily   . COPD (chronic obstructive pulmonary disease) (HCC)    Albuterol inhaler prn;SIngulair at night  . Depression    takes Wellbutrin daily  . Diabetes mellitus    takes Metformin daily  . GERD (gastroesophageal reflux disease)    takes Nexium daily  . History of colon polyps    benign  . Hypertension    takes Lisinopril daily  . Iron deficiency anemia 08/01/2017  . Joint pain   . Joint swelling   . Leukocytosis 02/09/2011  . Neck pain    HNP  . Normocytic anemia 02/09/2011  . Peripheral edema    takes Furosemide daily  . Pneumonia    many yrs ago  . Pulmonary nodules/lesions, multiple 08/09/2013  . Shortness of breath dyspnea    with exertion  . Thrombocytosis ([place]) 02/09/2011  . Urinary frequency   . Urinary urgency   . Weakness    numbness and tingling in both hands    Patient Active Problem List   Diagnosis Date Noted  . Acute on chronic respiratory failure  with hypoxia ([place]) 07/25/2019  . Chronic respiratory failure with hypoxia ([place]) 03/02/2019  . Iron deficiency anemia 08/01/2017  . Displaced supracondylar fracture without intracondylar extension of lower end of right femur, initial encounter for closed fracture ([place]) 05/23/2016  . 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 Closed fracture of right distal femur ([place])   . Osteoarthritis of spine with myelopathy, cervical region 11/17/2014  . 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 Pulmonary nodules/lesions, multiple 08/09/2013  . HLD (hyperlipidemia) 01/22/2012  . Pulmonary hypertension ([place]) 01/08/2012  . Chronic diastolic (congestive) heart failure ([place]) 01/05/2012  . 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 HTN (hypertension) 01/02/2012  . Tobacco abuse 07/04/2011  . 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 Leukocytosis 02/09/2011  . Mild Thrombocytosis 02/09/2011  . COPD (chronic obstructive pulmonary disease) ([place]) 02/09/2011  . Obesity 02/09/2011    Past Surgical History:  Procedure Laterality Date  . ANTERIOR CERVICAL DECOMP/DISCECTOMY FUSION N/A 11/17/2014   Procedure: Cervical four- cervical five Anterior Cervical Decompression with fusion and bonegraft;  Surgeon: Ashok Pall, MD;  Location: [facility];  Service: Neurosurgery;  Laterality:  N/A;  C45 anterior cervical decompression with fusion plating and bonegraft  . BIOPSY  02/26/2018   Procedure: BIOPSY;  Surgeon: Danie Binder, MD;  [facility];  Service: Endoscopy;;  (colon) Gastric  duodenal  . COLONOSCOPY    . COLONOSCOPY N/A 02/26/2018   Procedure: COLONOSCOPY;  Surgeon: Danie Binder, MD;  [facility];  Service: Endoscopy;  Laterality: N/A;  9:30am  . ESOPHAGOGASTRODUODENOSCOPY N/A 02/26/2018   Procedure: ESOPHAGOGASTRODUODENOSCOPY (EGD);  Surgeon: Danie Binder, MD;  Location[facility];  Service: Endoscopy;  Laterality: N/A;  .  FEMUR IM NAIL Right 05/23/2016  . FEMUR IM NAIL Right 05/23/2016   Procedure: INTRAMEDULLARY (IM) RETROGRADE FEMORAL NAILING;  Surgeon: [name], MD;  Location: [facility];  Service: Orthopedics;  Laterality: Right;  . KNEE ARTHROPLASTY Left 08/10/2013   Procedure:  TOTAL KNEE ARTHROPLASTY;  Surgeon: [name], MD;  Location: [facility];  Service: Orthopedics;  Laterality: Left;  Left Total Knee Arthroplasty Revision, Cemented, Semi-constrained,   . LEFT AND RIGHT HEART CATHETERIZATION WITH CORONARY ANGIOGRAM N/A 01/07/2012   Procedure: LEFT AND RIGHT HEART CATHETERIZATION WITH CORONARY ANGIOGRAM;  Surgeon: Thayer Headings, MD;  Location: [facility] CATH LAB;  Service: Cardiovascular;  Laterality: N/A;  . TONSILLECTOMY     as a child  . TOTAL KNEE ARTHROPLASTY Bilateral      OB History   No obstetric history on file.     Family History  Problem Relation Age of Onset  . Heart failure Mother   . Cancer Maternal Grandmother   . Colon cancer Neg Hx   . Colon polyps Neg Hx     Social History   Tobacco Use  . Smoking status: Current Every Day Smoker    Packs/day: 0.50    Years: 41.00    Pack years: 20.50    Types: Cigarettes  . Smokeless tobacco: Never Used  . Tobacco comment: pt currently trying to quit with gum/losenges  Substance Use Topics  . Alcohol use: Yes    Comment: wine  . Drug use: No    Home Medications Prior to Admission medications   Medication Sig Start Date End Date Taking? Authorizing Provider  albuterol (VENTOLIN HFA) 108 (90 Base) MCG/ACT inhaler Inhale 1-2 puffs into the lungs every 4 (four) hours as needed for wheezing or shortness of breath. Patient not taking: Reported on 03/02/2019 12/18/18   Shelly Coss, MD  amLODipine (NORVASC) 5 MG tablet Take 5 mg by mouth every morning.     [provider]  aspirin EC 325 MG tablet Take 325 mg by mouth every morning.     [provider]  celecoxib (CELEBREX) 200 MG capsule Take 200 mg by mouth every  morning.  10/01/16   [provider]  Clobetasol Prop Emollient Base 0.05 % emollient cream Apply 1 application topically 2 (two) times daily as needed (itching).  08/22/18   [provider]  esomeprazole (NEXIUM) 20 MG capsule Take 20 mg by mouth daily. 12/18/18   [provider]  furosemide (LASIX) 20 MG tablet Take 1 tablet (20 mg total) by mouth every other day. 04/01/13   Fay Records, MD  metFORMIN (GLUCOPHAGE) 500 MG tablet Take 1.5 tablets (750 mg total) by mouth 2 (two) times daily with a meal. 01/22/19   Barton Dubois, MD  montelukast (SINGULAIR) 10 MG tablet Take 10 mg by mouth at bedtime.    [provider]  umeclidinium-vilanterol (ANORO ELLIPTA) 62.5-25 MCG/INH AEPB  Inhale 1 puff into the lungs daily. 06/08/19   Margaretha Seeds, MD  ZORVOLEX 35 MG CAPS Take 1 capsule by mouth 3 (three) times daily. 03/09/19   [provider]    Allergies    Patient has no known allergies.  Review of Systems   Review of Systems  Constitutional: Negative for chills and fever.  HENT: Negative for congestion, rhinorrhea and sore throat.   Eyes: Negative for visual disturbance.  Respiratory: Positive for shortness of breath. Negative for cough.   Cardiovascular: Positive for leg swelling. Negative for chest pain.  Gastrointestinal: Negative for abdominal pain, diarrhea, nausea and vomiting.  Genitourinary: Negative for dysuria.  Musculoskeletal: Negative for back pain and neck pain.  Skin: Negative for rash.  Neurological: Negative for dizziness, light-headedness and headaches.  Hematological: Does not bruise/bleed easily.  Psychiatric/Behavioral: Negative for confusion.    Physical Exam Updated Vital Signs BP 140/63   Pulse 80   Temp 98.2 F (36.8 C)   Resp 19   Ht 1.6 m (5\' 3" )   Wt 96.6 kg   LMP 07/03/2011   SpO2 94%   BMI 37.73 kg/m   Physical Exam Vitals and nursing note reviewed.  Constitutional:      General: She is not in acute  distress.    Appearance: Normal appearance. She is well-developed.  HENT:     Head: Normocephalic and atraumatic.  Eyes:     Extraocular Movements: Extraocular movements intact.     Conjunctiva/sclera: Conjunctivae normal.     Pupils: Pupils are equal, round, and reactive to light.  Cardiovascular:     Rate and Rhythm: Normal rate and regular rhythm.     Heart sounds: No murmur.  Pulmonary:     Effort: Pulmonary effort is normal. No respiratory distress.     Breath sounds: Normal breath sounds. No wheezing.  Abdominal:     Palpations: Abdomen is soft.     Tenderness: There is no abdominal tenderness.  Musculoskeletal:     Cervical back: Normal range of motion and neck supple.     Right lower leg: Edema present.     Left lower leg: Edema present.     Comments: Marked bilateral lower extremity edema.  With a little bit open skin area on the right lateral leg area.  Just clear fluid.  Some erythema suggestive of chronic edema.  Good cap refills distally.  Skin:    General: Skin is warm and dry.     Capillary Refill: Capillary refill takes less than 2 seconds.  Neurological:     General: No focal deficit present.     Mental Status: She is alert and oriented to person, place, and time.     Cranial Nerves: No cranial nerve deficit.     Motor: No weakness.     ED Results / Procedures / Treatments   Labs (all labs ordered are listed, but only abnormal results are displayed) Labs Reviewed  CBC WITH DIFFERENTIAL/PLATELET - Abnormal; Notable for the following components:      Result Value   Hemoglobin 10.8 (*)    MCH 24.8 (*)    MCHC 29.3 (*)    RDW 20.4 (*)    All other components within normal limits  COMPREHENSIVE METABOLIC PANEL - Abnormal; Notable for the following components:   Sodium 132 (*)    Chloride 93 (*)    BUN 24 (*)    Creatinine, Ser 1.17 (*)    Calcium 8.3 (*)    Alkaline  Phosphatase 187 (*)    GFR calc non Af Amer 50 (*)    GFR calc Af Amer 58 (*)    All  other components within normal limits  BRAIN NATRIURETIC PEPTIDE - Abnormal; Notable for the following components:   B Natriuretic Peptide 805.0 (*)    All other components within normal limits  SARS CORONAVIRUS 2 BY RT PCR [facility] ORDER, [place] LAB)    EKG EKG Interpretation  Date/Time:  Saturday Jul 25 2019 10:43:59 EDT Ventricular Rate:  78 PR Interval:    QRS Duration: 100 QT Interval:  378 QTC Calculation: 431 R Axis:   -177 Text Interpretation: Sinus rhythm Probable RVH w/ secondary repol abnormality Confirmed by Fredia Sorrow [phone]) on 07/25/2019 10:49:19 AM   Radiology DG Chest 2 View  Result Date: 07/25/2019 CLINICAL DATA:  Shortness of breath EXAM: CHEST - 2 VIEW COMPARISON:  01/20/2019 FINDINGS: Heart size appears mildly enlarged, unchanged. Bilateral interstitial prominence without focal airspace consolidation. Low lung volumes. No large pleural fluid collection. No pneumothorax. Advanced degenerative changes of the bilateral shoulders. IMPRESSION: Bilateral interstitial prominence without focal airspace consolidation. Findings are likely accentuated by low lung volumes, however may reflect mild edema versus atypical/viral infection in the appropriate clinical setting. Electronically Signed   By: Davina Poke D.O.   On: 07/25/2019 11:38    Procedures Procedures (including critical care time)  CRITICAL CARE Performed by: Fredia Sorrow Total critical care time: 35 minutes Critical care time was exclusive of separately billable procedures and treating other patients. Critical care was necessary to treat or prevent imminent or life-threatening deterioration. Critical care was time spent personally by me on the following activities: development of treatment plan with patient and/or surrogate as well as nursing, discussions with consultants, evaluation of patient's response to treatment, examination of patient, obtaining history from  patient or surrogate, ordering and performing treatments and interventions, ordering and review of laboratory studies, ordering and review of radiographic studies, pulse oximetry and re-evaluation of patient's condition.   Medications Ordered in ED Medications  albuterol (VENTOLIN HFA) 108 (90 Base) MCG/ACT inhaler 2 puff (2 puffs Inhalation Given 07/25/19 1141)  furosemide (LASIX) injection 80 mg (80 mg Intravenous Given 07/25/19 1338)    ED Course  I have reviewed the triage vital signs and the nursing notes.  Pertinent labs & imaging results that were available during my care of the patient were reviewed by me and considered in my medical decision making (see chart for details).    MDM Rules/Calculators/A&P                      Patient work-up definitely has degree of hypoxia but she is alert.  On 6 L high flow nasal cannula patient's oxygen levels in the around 93%.  And patient comfortable.  Patient given 80 mg of Lasix IV.  Covid testing was done and was negative as because chest x-ray raise some concern about atypical stuff.  But also was in florid pulmonary edema.  But clinically that seems to be what is ongoing.  No significant wheezing.  No leukocytosis.  So do not feel that there is a degree of pneumonia.  Renal function is basically baseline.  BNP markedly elevated in the 800 range.  Patient is going to get CT angio chest just to rule out PE.  Discussed with hospitalist they will admit.  Final Clinical Impression(s) / ED Diagnoses Final diagnoses:  Congestive heart failure, unspecified HF chronicity, unspecified  heart failure type [facility])  Chronic obstructive pulmonary disease, unspecified COPD type [facility])    Rx / DC Orders ED Discharge Orders    None       Fredia Sorrow, MD 07/25/19 1453

## 2019-07-25 NOTE — ED Triage Notes (Signed)
Pt brought in by EMS due to SOB and hypoxia. Initial sats with 3 Lvia [place] 60%. NRB at 15 L and increased to  98%. Once NRB off decrease to 80% on 6 L via [place]. Pt has not had called in lasix pt says dr has not called in and they don't answer phone. Denies pain. Legs weeping

## 2019-07-25 NOTE — ED Notes (Addendum)
Pt placed on 3L/min [place] (as ordered from at home), pt sats 79%, increased to 5L, sats 80%, respiratory called for high-flow O2, EDP aware

## 2019-07-25 NOTE — ED Notes (Signed)
ED TO INPATIENT HANDOFF REPORT  ED Nurse Name and Phone #:  [phone]  S Name/Age/Gender Debbie Mullins 64 y.o. female Room/Bed: APA02/APA02  Code Status   Code Status: Prior  Home/SNF/Other Home Patient oriented to: self, place, time and situation Is this baseline?   Triage Complete: Triage complete  Chief Complaint EMS  Triage Note Pt brought in by EMS due to SOB and hypoxia. Initial sats with 3 Lvia [place] 60%. NRB at 15 L and increased to  98%. Once NRB off decrease to 80% on 6 L via [place]. Pt has not had called in lasix pt says dr has not called in and they don't answer phone. Denies pain. Legs weeping    Allergies No Known Allergies  Level of Care/Admitting Diagnosis ED Disposition    ED Disposition Condition Comment   Admit  The patient appears reasonably stabilized for admission considering the current resources, flow, and capabilities available in the ED at this time, and I doubt any other [facility] requiring further screening and/or treatment in the ED prior to admission is  present.       B Medical/Surgery History Past Medical History:  Diagnosis Date  . Anemia   . Asthma   . CHF (congestive heart failure) (HCC)    takes Furosemide daily   . COPD (chronic obstructive pulmonary disease) (HCC)    Albuterol inhaler prn;SIngulair at night  . Depression    takes Wellbutrin daily  . Diabetes mellitus    takes Metformin daily  . GERD (gastroesophageal reflux disease)    takes Nexium daily  . History of colon polyps    benign  . Hypertension    takes Lisinopril daily  . Iron deficiency anemia 08/01/2017  . Joint pain   . Joint swelling   . Leukocytosis 02/09/2011  . Neck pain    HNP  . Normocytic anemia 02/09/2011  . Peripheral edema    takes Furosemide daily  . Pneumonia    many yrs ago  . Pulmonary nodules/lesions, multiple 08/09/2013  . Shortness of breath dyspnea    with exertion  . Thrombocytosis ([place]) 02/09/2011  . Urinary frequency   . Urinary  urgency   . Weakness    numbness and tingling in both hands   Past Surgical History:  Procedure Laterality Date  . ANTERIOR CERVICAL DECOMP/DISCECTOMY FUSION N/A 11/17/2014   Procedure: Cervical four- cervical five Anterior Cervical Decompression with fusion and bonegraft;  Surgeon: Ashok Pall, MD;  Location: [facility];  Service: Neurosurgery;  Laterality: N/A;  C45 anterior cervical decompression with fusion plating and bonegraft  . BIOPSY  02/26/2018   Procedure: BIOPSY;  Surgeon: Danie Binder, MD;  [facility];  Service: Endoscopy;;  (colon) Gastric  duodenal  . COLONOSCOPY    . COLONOSCOPY N/A 02/26/2018   Procedure: COLONOSCOPY;  Surgeon: Danie Binder, MD;  [facility];  Service: Endoscopy;  Laterality: N/A;  9:30am  . ESOPHAGOGASTRODUODENOSCOPY N/A 02/26/2018   Procedure: ESOPHAGOGASTRODUODENOSCOPY (EGD);  Surgeon: Danie Binder, MD;  [facility];  Service: Endoscopy;  Laterality: N/A;  . FEMUR IM NAIL Right 05/23/2016  . FEMUR IM NAIL Right 05/23/2016   Procedure: INTRAMEDULLARY (IM) RETROGRADE FEMORAL NAILING;  Surgeon: [name], MD;  Location: [facility];  Service: Orthopedics;  Laterality: Right;  . KNEE ARTHROPLASTY Left 08/10/2013   Procedure:  TOTAL KNEE ARTHROPLASTY;  Surgeon: [name], MD;  Location: [facility];  Service: Orthopedics;  Laterality: Left;  Left  Total Knee Arthroplasty Revision, Cemented, Semi-constrained,   . LEFT AND RIGHT HEART CATHETERIZATION WITH CORONARY ANGIOGRAM N/A 01/07/2012   Procedure: LEFT AND RIGHT HEART CATHETERIZATION WITH CORONARY ANGIOGRAM;  Surgeon: Thayer Headings, MD;  Location: [facility] CATH LAB;  Service: Cardiovascular;  Laterality: N/A;  . TONSILLECTOMY     as a child  . TOTAL KNEE ARTHROPLASTY Bilateral      A IV Location/Drains/Wounds Patient Lines/Drains/Airways Status   Active Line/Drains/Airways    Name:   Placement date:   Placement time:   Site:   Days:   Peripheral IV 07/25/19 Left    07/25/19    1044    --   less than 1   Peripheral IV 07/25/19 Right Antecubital   07/25/19    1403    Antecubital   less than 1   Closed System Drain   05/23/16    1941    --   1158   External Urinary Catheter   01/21/19    1537    --   185   Incision (Closed) 08/10/13 Knee Left   08/10/13    1332     2175   Incision (Closed) 11/17/14 Neck   11/17/14    1513     1711   Incision (Closed) 05/23/16 Knee Right   05/23/16    2032     1158          Intake/Output Last 24 hours No intake or output data in the 24 hours ending 07/25/19 1420  Labs/Imaging Results for orders placed or performed during the hospital encounter of 07/25/19 (from the past 48 hour(s))  SARS Coronavirus 2 by RT PCR (hospital order, performed in [place] hospital lab) Nasopharyngeal Nasopharyngeal Swab     Status: None   Collection Time: 07/25/19 12:05 PM   Specimen: Nasopharyngeal Swab  Result Value Ref Range   SARS Coronavirus 2 NEGATIVE NEGATIVE    Comment: (NOTE) SARS-CoV-2 target nucleic acids are NOT DETECTED. The SARS-CoV-2 RNA is generally detectable in upper and lower respiratory specimens during the acute phase of infection. The lowest concentration of SARS-CoV-2 viral copies this assay can detect is 250 copies / mL. A negative result does not preclude SARS-CoV-2 infection and should not be used as the sole basis for treatment or other patient management decisions.  A negative result may occur with improper specimen collection / handling, submission of specimen other than nasopharyngeal swab, presence of viral mutation(s) within the areas targeted by this assay, and inadequate number of viral copies (<250 copies / mL). A negative result must be combined with clinical observations, patient history, and epidemiological information. Fact Sheet for Patients:   StrictlyIdeas.no Fact Sheet for Healthcare Providers: BankingDealers.co.za This test is not yet  approved or cleared  by the [place] FDA and has been authorized for detection and/or diagnosis of SARS-CoV-2 by FDA under an Emergency Use Authorization (EUA).  This EUA will remain in effect (meaning this test can be used) for the duration of the COVID-19 declaration under Section 564(b)(1) of the Act, 21 U.S.C. section 360bbb-3(b)(1), unless the authorization is terminated or revoked sooner. Performed at [facility], [address], [place], [place] [address]   CBC with Differential/Platelet     Status: Abnormal   Collection Time: 07/25/19 12:08 PM  Result Value Ref Range   WBC 6.3 4.0 - 10.5 K/uL   RBC 4.35 3.87 - 5.11 MIL/uL   Hemoglobin 10.8 (L) 12.0 - 15.0 g/dL   HCT 36.8 36.0 -  46.0 %   MCV 84.6 80.0 - 100.0 fL   MCH 24.8 (L) 26.0 - 34.0 pg   MCHC 29.3 (L) 30.0 - 36.0 g/dL   RDW 20.4 (H) 11.5 - 15.5 %   Platelets 274 150 - 400 K/uL   nRBC 0.0 0.0 - 0.2 %   Neutrophils Relative % 78 %   Neutro Abs 5.0 1.7 - 7.7 K/uL   Lymphocytes Relative 15 %   Lymphs Abs 0.9 0.7 - 4.0 K/uL   Monocytes Relative 5 %   Monocytes Absolute 0.3 0.1 - 1.0 K/uL   Eosinophils Relative 0 %   Eosinophils Absolute 0.0 0.0 - 0.5 K/uL   Basophils Relative 1 %   Basophils Absolute 0.0 0.0 - 0.1 K/uL   Immature Granulocytes 1 %   Abs Immature Granulocytes 0.03 0.00 - 0.07 K/uL    Comment: Performed at [facility], [address] [place], [place] [address]  Comprehensive metabolic panel     Status: Abnormal   Collection Time: 07/25/19 12:08 PM  Result Value Ref Range   Sodium 132 (L) 135 - 145 mmol/L   Potassium 4.9 3.5 - 5.1 mmol/L   Chloride 93 (L) 98 - 111 mmol/L   CO2 26 22 - 32 mmol/L   Glucose, Bld 97 70 - 99 mg/dL    Comment: Glucose reference range applies only to samples taken after fasting for at least 8 hours.   BUN 24 (H) 8 - 23 mg/dL   Creatinine, Ser 1.17 (H) 0.44 - 1.00 mg/dL   Calcium 8.3 (L) 8.9 - 10.3 mg/dL   Total Protein 7.8 6.5 - 8.1 g/dL   Albumin 3.5 3.5 - 5.0 g/dL    AST 15 15 - 41 U/L   ALT 16 0 - 44 U/L   Alkaline Phosphatase 187 (H) 38 - 126 U/L   Total Bilirubin 0.9 0.3 - 1.2 mg/dL   GFR calc non Af Amer 50 (L) >60 mL/min   GFR calc Af Amer 58 (L) >60 mL/min   Anion gap 13 5 - 15    Comment: Performed at [facility], [address] [place], [place] [address]  Brain natriuretic peptide     Status: Abnormal   Collection Time: 07/25/19 12:08 PM  Result Value Ref Range   B Natriuretic Peptide 805.0 (H) 0.0 - 100.0 pg/mL    Comment: Performed at [facility], [address] [place], [place] [address]   DG Chest 2 View  Result Date: 07/25/2019 CLINICAL DATA:  Shortness of breath EXAM: CHEST - 2 VIEW COMPARISON:  01/20/2019 FINDINGS: Heart size appears mildly enlarged, unchanged. Bilateral interstitial prominence without focal airspace consolidation. Low lung volumes. No large pleural fluid collection. No pneumothorax. Advanced degenerative changes of the bilateral shoulders. IMPRESSION: Bilateral interstitial prominence without focal airspace consolidation. Findings are likely accentuated by low lung volumes, however may reflect mild edema versus atypical/viral infection in the appropriate clinical setting. Electronically Signed   By: Davina Poke D.O.   On: 07/25/2019 11:38    Pending Labs Unresulted Labs (From admission, onward)   None      Vitals/Pain Today's Vitals   07/25/19 1130 07/25/19 1338 07/25/19 1418 07/25/19 1419  BP: 112/83 130/87 140/63   Pulse:  78 80 80  Resp: (!) 23 20 (!) 23 19  Temp:      SpO2:  92% 93% 94%  Weight:      Height:      PainSc:        Isolation  Precautions No active isolations  Medications Medications  albuterol (VENTOLIN HFA) 108 (90 Base) MCG/ACT inhaler 2 puff (2 puffs Inhalation Given 07/25/19 1141)  furosemide (LASIX) injection 80 mg (80 mg Intravenous Given 07/25/19 1338)    Mobility walks with device High fall risk   Focused Assessments    R Recommendations: See Admitting  Provider Note  Report given to:   Additional Notes:

## 2019-07-25 NOTE — H&P (Signed)
History and Physical    DELL DEBS [id number] DOB: 03/26/1955 DOA: 07/25/2019  PCP: Lucianne Lei, MD   Patient coming from: Home  I have personally briefly reviewed patient's old medical records in [place]  Chief Complaint: Shortness of breath and increased swelling.  HPI: Debbie Mullins is a 64 y.o. female with medical history significant of chronic respiratory failure with hypoxia due to COPD, type 2 diabetes mellitus with nephropathy, chronic kidney disease a stage IIIa, gastroesophageal flux disease, hypertension, diastolic heart failure and class II obesity; who presented to the hospital secondary to worsening shortness of breath.  Patient expressed symptoms started approximately 4 days prior to admission has steadily continued worsening.  She expressed no fever, no chills, no chest pain, no nausea or vomiting.  No dysuria, no hematuria, no melena, no hematochezia.  Positive complaint for shortness of breath on exertion, limiting her activity capacity and associated orthopnea.  Also has noted increase in her lower extremity swelling and associated weeping process.  On arrival to the ED patient found significantly hypoxic and requiring nonrebreather supplementation. After IV lasix given and bronchodilator management stabilized to use high flow [place].  Of note, patient uses 2.5-3 L nasal cannula supplementation chronically at home.  When attempted to place back on this amount of oxygen her O2 sat dropped into the low 80s.  ED Course: Elevated BNP, chest x-ray demonstrating vascular congestion, positive hypoxia.  Signs of fluid overload bilaterally.  Electrolytes and renal function essentially at baseline.  No elevated WBCs.  Patient received IV Lasix and [facility] contacted to admit patient for further evaluation and management of acute on chronic respiratory failure with hypoxia due to COPD and CHF exacerbation.  Review of Systems: As per HPI otherwise all other systems reviewed  and are negative.   Past Medical History:  Diagnosis Date  . Anemia   . Asthma   . CHF (congestive heart failure) (HCC)    takes Furosemide daily   . COPD (chronic obstructive pulmonary disease) (HCC)    Albuterol inhaler prn;SIngulair at night  . Depression    takes Wellbutrin daily  . Diabetes mellitus    takes Metformin daily  . GERD (gastroesophageal reflux disease)    takes Nexium daily  . History of colon polyps    benign  . Hypertension    takes Lisinopril daily  . Iron deficiency anemia 08/01/2017  . Joint pain   . Joint swelling   . Leukocytosis 02/09/2011  . Neck pain    HNP  . Normocytic anemia 02/09/2011  . Peripheral edema    takes Furosemide daily  . Pneumonia    many yrs ago  . Pulmonary nodules/lesions, multiple 08/09/2013  . Shortness of breath dyspnea    with exertion  . Thrombocytosis ([place]) 02/09/2011  . Urinary frequency   . Urinary urgency   . Weakness    numbness and tingling in both hands    Past Surgical History:  Procedure Laterality Date  . ANTERIOR CERVICAL DECOMP/DISCECTOMY FUSION N/A 11/17/2014   Procedure: Cervical four- cervical five Anterior Cervical Decompression with fusion and bonegraft;  Surgeon: Ashok Pall, MD;  Location: [facility];  Service: Neurosurgery;  Laterality: N/A;  C45 anterior cervical decompression with fusion plating and bonegraft  . BIOPSY  02/26/2018   Procedure: BIOPSY;  Surgeon: Danie Binder, MD;  [facility];  Service: Endoscopy;;  (colon) Gastric  duodenal  . COLONOSCOPY    . COLONOSCOPY N/A 02/26/2018   Procedure: COLONOSCOPY;  Surgeon: Danie Binder, MD;  [facility]: [facility];  Service: Endoscopy;  Laterality: N/A;  9:30am  . ESOPHAGOGASTRODUODENOSCOPY N/A 02/26/2018   Procedure: ESOPHAGOGASTRODUODENOSCOPY (EGD);  Surgeon: Danie Binder, MD;  [facility];  Service: Endoscopy;  Laterality: N/A;  . FEMUR IM NAIL Right 05/23/2016  . FEMUR IM NAIL Right 05/23/2016    Procedure: INTRAMEDULLARY (IM) RETROGRADE FEMORAL NAILING;  Surgeon: [name], MD;  Location: [facility];  Service: Orthopedics;  Laterality: Right;  . KNEE ARTHROPLASTY Left 08/10/2013   Procedure:  TOTAL KNEE ARTHROPLASTY;  Surgeon: [name], MD;  Location: [facility];  Service: Orthopedics;  Laterality: Left;  Left Total Knee Arthroplasty Revision, Cemented, Semi-constrained,   . LEFT AND RIGHT HEART CATHETERIZATION WITH CORONARY ANGIOGRAM N/A 01/07/2012   Procedure: LEFT AND RIGHT HEART CATHETERIZATION WITH CORONARY ANGIOGRAM;  Surgeon: Thayer Headings, MD;  Location[facility];  Service: Cardiovascular;  Laterality: N/A;  . TONSILLECTOMY     as a child  . TOTAL KNEE ARTHROPLASTY Bilateral     Social History  reports that she has been smoking cigarettes. She has a 20.50 pack-year smoking history. She has never used smokeless tobacco. She reports current alcohol use. She reports that she does not use drugs.  No Known Allergies  Family History  Problem Relation Age of Onset  . Heart failure Mother   . Cancer Maternal Grandmother   . Colon cancer Neg Hx   . Colon polyps Neg Hx     Prior to Admission medications   Medication Sig Start Date End Date Taking? Authorizing Provider  albuterol (VENTOLIN HFA) 108 (90 Base) MCG/ACT inhaler Inhale 1-2 puffs into the lungs every 4 (four) hours as needed for wheezing or shortness of breath. 12/18/18  Yes Shelly Coss, MD  amLODipine (NORVASC) 5 MG tablet Take 5 mg by mouth every morning.    Yes [provider]  aspirin EC 325 MG tablet Take 325 mg by mouth every morning.    Yes [provider]  celecoxib (CELEBREX) 200 MG capsule Take 200 mg by mouth every morning.  10/01/16  Yes [provider]  Clobetasol Prop Emollient Base 0.05 % emollient cream Apply 1 application topically 2 (two) times daily as needed (itching).  08/22/18  Yes [provider]  esomeprazole (NEXIUM) 20 MG capsule Take 20 mg by mouth daily.  12/18/18  Yes [provider]  furosemide (LASIX) 20 MG tablet Take 1 tablet (20 mg total) by mouth every other day. 04/01/13  Yes Fay Records, MD  metFORMIN (GLUCOPHAGE) 500 MG tablet Take 1.5 tablets (750 mg total) by mouth 2 (two) times daily with a meal. 01/22/19  Yes Barton Dubois, MD  montelukast (SINGULAIR) 10 MG tablet Take 10 mg by mouth at bedtime.   Yes [provider]  umeclidinium-vilanterol (ANORO ELLIPTA) 62.5-25 MCG/INH AEPB Inhale 1 puff into the lungs daily. 06/08/19  Yes Margaretha Seeds, MD  ZORVOLEX 35 MG CAPS Take 1 capsule by mouth 3 (three) times daily. 03/09/19  Yes [provider]    Physical Exam: Vitals:   07/25/19 1338 07/25/19 1418 07/25/19 1419 07/25/19 1600  BP: 130/87 140/63  (!) 119/98  Pulse: 78 80 80 77  Resp: 20 (!) 23 19 18   Temp:    98.6 F (37 C)  TempSrc:    Oral  SpO2: 92% 93% 94% 99%  Weight:      Height:        Constitutional: Reporting shortness  of breath with activity and orthopnea.  Signs of fluid overload appreciated on exam.  No chest pain Vitals:   07/25/19 1338 07/25/19 1418 07/25/19 1419 07/25/19 1600  BP: 130/87 140/63  (!) 119/98  Pulse: 78 80 80 77  Resp: 20 (!) 23 19 18   Temp:    98.6 F (37 C)  TempSrc:    Oral  SpO2: 92% 93% 94% 99%  Weight:      Height:       Eyes: PERRL, lids and conjunctivae normal; no icterus. ENMT: Mucous membranes are moist. Posterior pharynx clear of any exudate or lesions. Neck: normal, supple, no masses, no thyromegaly. Respiratory: Decreased air movement bilaterally; positive expiratory wheezing and fine crackles on examination.  No using accessory muscle. Cardiovascular: Regular rate and rhythm, no rubs or gallops; positive JVD. Abdomen: no tenderness, no masses palpated. No hepatosplenomegaly. Bowel sounds positive.  Musculoskeletal: no clubbing / cyanosis. Moving four limbs spontaneously  Skin: Chronic lower extremity stasis dermatitis and weeping from  swelling. No signs of superimposed infection. Neurologic: CN 2-12 grossly intact. Sensation intact, DTR normal. Strength 5/5 in all 4.  Psychiatric: Normal judgment and insight. Alert and oriented x 3. Normal mood.    Labs on Admission: I have personally reviewed following labs and imaging studies  CBC: Recent Labs  Lab 07/25/19 1208  WBC 6.3  NEUTROABS 5.0  HGB 10.8*  HCT 36.8  MCV 84.6  PLT 123456    Basic Metabolic Panel: Recent Labs  Lab 07/25/19 1208  NA 132*  K 4.9  CL 93*  CO2 26  GLUCOSE 97  BUN 24*  CREATININE 1.17*  CALCIUM 8.3*  MG 1.3*    GFR: Estimated Creatinine Clearance: 55.2 mL/min (A) (by C-G formula based on SCr of 1.17 mg/dL (H)).  Liver Function Tests: Recent Labs  Lab 07/25/19 1208  AST 15  ALT 16  ALKPHOS 187*  BILITOT 0.9  PROT 7.8  ALBUMIN 3.5    Urine analysis:    Component Value Date/Time   COLORURINE STRAW (A) 12/15/2018 2200   APPEARANCEUR CLEAR 12/15/2018 2200   LABSPEC 1.009 12/15/2018 2200   PHURINE 5.0 12/15/2018 2200   GLUCOSEU NEGATIVE 12/15/2018 2200   HGBUR SMALL (A) 12/15/2018 2200   BILIRUBINUR NEGATIVE 12/15/2018 2200   KETONESUR NEGATIVE 12/15/2018 2200   PROTEINUR NEGATIVE 12/15/2018 2200   UROBILINOGEN 1.0 08/05/2013 1018   NITRITE NEGATIVE 12/15/2018 2200   LEUKOCYTESUR NEGATIVE 12/15/2018 2200    Radiological Exams on Admission: DG Chest 2 View  Result Date: 07/25/2019 CLINICAL DATA:  Shortness of breath EXAM: CHEST - 2 VIEW COMPARISON:  01/20/2019 FINDINGS: Heart size appears mildly enlarged, unchanged. Bilateral interstitial prominence without focal airspace consolidation. Low lung volumes. No large pleural fluid collection. No pneumothorax. Advanced degenerative changes of the bilateral shoulders. IMPRESSION: Bilateral interstitial prominence without focal airspace consolidation. Findings are likely accentuated by low lung volumes, however may reflect mild edema versus atypical/viral infection in the  appropriate clinical setting. Electronically Signed   By: Davina Poke D.O.   On: 07/25/2019 11:38   CT Angio Chest PE W/Cm &/Or Wo Cm  Result Date: 07/25/2019 CLINICAL DATA:  Shortness of breath and hypoxia, not taking Lasix EXAM: CT ANGIOGRAPHY CHEST WITH CONTRAST TECHNIQUE: Multidetector CT imaging of the chest was performed using the standard protocol during bolus administration of intravenous contrast. Multiplanar CT image reconstructions and MIPs were obtained to evaluate the vascular anatomy. CONTRAST:  142mL OMNIPAQUE IOHEXOL 350 MG/ML SOLN COMPARISON:  Radiograph 07/25/2019, CT 01/21/2019,  CTa 12/15/2018 FINDINGS: Cardiovascular: Satisfactory opacification the pulmonary arteries to the segmental level. No pulmonary artery filling defects are identified. Central pulmonary arterial enlargement is similar to comparison studies and likely reflect some chronic pulmonary artery hypertension. Reflux of contrast into the IVC and hepatic veins is noted. Cardiac size is borderline enlarged. Three-vessel coronary artery disease is noted. No pericardial effusion. Atherosclerotic plaque within the normal caliber aorta. Additional calcifications seen in the normally branching great vessels. Slight tortuosity of the right brachiocephalic vasculature is noted. Mediastinum/Nodes: Mediastinal edematous changes. No mediastinal fluid or gas. Normal thyroid gland and thoracic inlet. No acute abnormality of the esophagus. Few secretions noted in the otherwise unremarkable trachea. No worrisome mediastinal, hilar or axillary adenopathy. Lungs/Pleura: Redemonstration of the mid to upper lung mosaic attenuation likely accentuated by low lung volumes secondary to imaging during exhalation. Some mild interlobular septal thickening and developing ground-glass opacity throughout the lungs is noted with extensive basilar volume loss and some areas of more dense peribronchovascular opacity in the lung bases. Trace effusions are  present. No pneumothorax. Upper Abdomen: Edematous mesenteric changes in the upper abdomen. No other acute abnormality. Musculoskeletal: Diffuse circumferential body wall edema The osseous structures appear diffusely demineralized which may limit detection of small or nondisplaced fractures. No acute osseous abnormality or suspicious osseous lesion. Prior cervical spine fusion. Multilevel degenerative changes are present in the imaged portions of the spine and shoulders. Review of the MIP images confirms the above findings. IMPRESSION: 1. No acute pulmonary artery filling defects to suggest acute pulmonary embolism. 2. Central pulmonary arterial enlargement is similar to comparison studies and likely reflect some chronic pulmonary artery hypertension. 3. Reflux of contrast into the IVC and hepatic veins may suggest elevated right heart pressures/right heart failure. 4. Diffuse body wall edema, mesenteric edema and trace effusions further support a diagnosis of heart failure/volume overload. 5. Mosaic attenuation of the lungs is a more chronic finding likely accentuated by low lung volumes. Prior HRCT speculated potential chronic hypersensitivity pneumonitis or sarcoid. Correlate with history. 6. More dense peribronchovascular opacity in the lung bases may reflect atelectasis though superimposed infection is not fully excluded. 7. Aortic Atherosclerosis (ICD10-I70.0). Electronically Signed   By: Lovena Le M.D.   On: 07/25/2019 15:37    EKG: Independently reviewed.  Sinus rhythm; no acute ischemic changes.  Assessment/Plan 1-Acute on chronic respiratory failure with hypoxia (HCC) -In the setting of COPD exacerbation and CHF exacerbation -Patient admitted to telemetry bed -Started on nebulizer management, Pulmicort, Brovana, prednisone -Flutter valve on doxycycline also added. -For CHF will follow daily weights, strict I's and O's, IV Lasix and repeat 2D echo. -Continue oxygen supplementation and titrate  down as tolerated to baseline (patient uses 2.5-3 L chronically). -No chest pain. -Covid test negative -CT angiogram of the chest demonstrated no pulmonary embolism.  Positive vascular congestion and concern for component of pulmonary hypertension with right heart failure.  2-class 2 Obesity -Body mass index is 39.79 kg/m. -Low calorie diet, portion control and increase physical activity discussed with patient.  3-Type 2 diabetes mellitus with renal complication (HCC) -Chronic a stage IIIa at baseline -Hold oral hypoglycemic agents -Started on sliding scale insulin -Anticipate some increasing her CBGs with the use of a steroids; will follow blood sugar and if needed add long-acting insulin. -will check A1C  4-HTN (hypertension) -Stable and well controlled. -continue heart healthy diet -holding norvasc initially -continue IV diuresis (which is controlling BP as well) -might benefit of selective b-blocker due to CHf component and underlying  hx of COPD.  5-CKD stage 3a -mild increase in her Cr appreciated, but GFR at baseline -follow closely with acute diuresis -Minimize the use of nephrotoxic agents  6-GERD -continue PPI    DVT prophylaxis: Heparin Code Status:   Full code Family Communication:  No family at bedside. Disposition Plan:   Patient is from:  home  Anticipated DC to:  Hopefully home; will have physical therapy evaluate inpatient when appropriate to assess safe discharge plans.  Anticipated DC date:  To be determined; most likely 07/28/2019; if breathing/volume status is stabilized by 10.  Anticipated DC barriers: Acute on chronic respiratory failure with hypoxia; requiring high flow oxygen supplementation and the need of IV diuresis for vascular congestion in the setting of CHF exacerbation.  Also with component of  COPD exacerbation. Consults called:  None Admission status:  Telemetry bed, length of stay more than 2 midnights; inpatient status.  Severity of  Illness: Moderate severity; inpatient status, length of stay anticipated for more than 2 midnights.  Patient with acute on chronic respiratory failure with hypoxia requiring high flow nasal cannula supplementation up to 6 L to maintain O2 sat above 90%.  Culprit causes appears to be exacerbation of COPD along with CHF exacerbation.    Barton Dubois MD [facility]  How to contact the [facility] Attending or Consulting provider [place] or covering provider during after hours [place], for this patient?   1. Check the care team in [facility] and look for a) attending/consulting [facility] provider listed and b) the [facility] team listed 2. Log into [email or web address] and use [place]'s universal password to access. If you do not have the password, please contact the hospital operator. 3. Locate the [facility] provider you are looking for under [facility] and page to a number that you can be directly reached. 4. If you still have difficulty reaching the provider, please page the [facility] (Director on Call) for the Hospitalists listed on amion for assistance.  07/25/2019, 4:23 PM

## 2019-07-25 NOTE — ED Notes (Signed)
Pt to CT at this time.

## 2019-07-26 ENCOUNTER — Inpatient Hospital Stay (HOSPITAL_COMMUNITY): Payer: Medicare Other

## 2019-07-26 DIAGNOSIS — I5032 Chronic diastolic (congestive) heart failure: Secondary | ICD-10-CM

## 2019-07-26 DIAGNOSIS — E6609 Other obesity due to excess calories: Secondary | ICD-10-CM

## 2019-07-26 DIAGNOSIS — L8990 Pressure ulcer of unspecified site, unspecified stage: Secondary | ICD-10-CM | POA: Insufficient documentation

## 2019-07-26 DIAGNOSIS — L89322 Pressure ulcer of left buttock, stage 2: Secondary | ICD-10-CM

## 2019-07-26 DIAGNOSIS — Z6839 Body mass index (BMI) 39.0-39.9, adult: Secondary | ICD-10-CM

## 2019-07-26 LAB — BASIC METABOLIC PANEL
Anion gap: 9 (ref 5–15)
BUN: 23 mg/dL (ref 8–23)
CO2: 28 mmol/L (ref 22–32)
Calcium: 8 mg/dL — ABNORMAL LOW (ref 8.9–10.3)
Chloride: 92 mmol/L — ABNORMAL LOW (ref 98–111)
Creatinine, Ser: 1.18 mg/dL — ABNORMAL HIGH (ref 0.44–1.00)
GFR calc Af Amer: 57 mL/min — ABNORMAL LOW (ref >60)
GFR calc non Af Amer: 49 mL/min — ABNORMAL LOW (ref >60)
Glucose, Bld: 101 mg/dL — ABNORMAL HIGH (ref 70–99)
Potassium: 4.5 mmol/L (ref 3.5–5.1)
Sodium: 129 mmol/L — ABNORMAL LOW (ref 135–145)

## 2019-07-26 LAB — GLUCOSE, CAPILLARY
Glucose-Capillary: 90 mg/dL (ref 70–99)
Glucose-Capillary: 116 mg/dL — ABNORMAL HIGH (ref 70–99)
Glucose-Capillary: 176 mg/dL — ABNORMAL HIGH (ref 70–99)
Glucose-Capillary: 147 mg/dL — ABNORMAL HIGH (ref 70–99)

## 2019-07-26 LAB — HEMOGLOBIN A1C
Hgb A1c MFr Bld: 6.3 % — ABNORMAL HIGH (ref 4.8–5.6)
Mean Plasma Glucose: 134.11 mg/dL

## 2019-07-26 LAB — ECHOCARDIOGRAM COMPLETE
Height: 63 in
Weight: 3594.38 oz

## 2019-07-26 LAB — TSH: TSH: <37.5 u[IU]/mL — ABNORMAL HIGH (ref 0.350–4.500)

## 2019-07-26 NOTE — Plan of Care (Signed)

## 2019-07-26 NOTE — Progress Notes (Signed)
PROGRESS NOTE    Debbie Mullins  [id number] DOB: 12-10-1955 DOA: 07/25/2019 PCP: Lucianne Lei, MD  Chief Complaint  Patient presents with  . Shortness of Breath    Brief Narrative:  64 y.o. female with medical history significant of chronic respiratory failure with hypoxia due to COPD, type 2 diabetes mellitus with nephropathy, chronic kidney disease a stage IIIa, gastroesophageal flux disease, hypertension, diastolic heart failure and class II obesity; who presented to the hospital secondary to worsening shortness of breath.  Patient expressed symptoms started approximately 4 days prior to admission has steadily continued worsening.  She expressed no fever, no chills, no chest pain, no nausea or vomiting.  No dysuria, no hematuria, no melena, no hematochezia.  Positive complaint for shortness of breath on exertion, limiting her activity capacity and associated orthopnea.  Also has noted increase in her lower extremity swelling and associated weeping process.  On arrival to the ED patient found significantly hypoxic and requiring nonrebreather supplementation. After IV lasix given and bronchodilator management stabilized to use high flow [place].  Of note, patient uses 2.5-3 L nasal cannula supplementation chronically at home.  When attempted to place back on this amount of oxygen her O2 sat dropped into the low 80s.   Assessment & Plan: 1-acute on chronic respiratory failure with hypoxia ([place]): Due to COPD exacerbation and CHF exacerbation. -Continue telemetry monitoring -Continue IV diuresis -Continue to follow electrolytes and renal function on a daily basis with repletion as needed. -Continue to follow daily weights and strict I's and O's; Place TED hoses. -Follow 2D echo results. -Patient expressed breathing is improving; currently with very little wheezing.  -Continue oral prednisone, nebulizer management and oral doxycycline. -Continue to wean oxygen supplementation down as  tolerated to baseline.  2-class II obesity Body mass index is 39.79 kg/m. -Low calorie diet and portion control has been discussed with patient.  3-Type 2 diabetes mellitus with renal complication (HCC) -Continue holding oral hypoglycemic agents -Continue modified carbohydrate diet -Follow CBGs -Continue sliding scale insulin. 5-A1C 6.3  4-chronic kidney disease is stage IIIa -Continue to minimize nephrotoxic agents -Continue to follow renal function trend in the setting of acute diuresis -Advised to maintain adequate oral hydration.  5-essential hypertension -Appears to be well controlled and stable currently. -Continue heart healthy diet (specifically low-sodium diet, less than 2.5 g daily). -Follow vital signs.  6-GERD -Continue PPI.     DVT prophylaxis: (Lovenox) Code Status: Full code Family Communication: No family at bedside. Disposition:   Status is: Inpatient   Dispo: The patient is from: Home              Anticipated d/c is to: To be determined              Anticipated d/c date is: Hopefully on 07/28/2027              Patient currently not medically stable, still requiring higher level of oxygen supplementation and with signs of fluid overload on examination.  Continue IV diuresis and further management for COPD exacerbation.        Consultants:   None    Procedures:  See below for x-ray reports   Antimicrobials:  doxycycline (planning for a total of 5 days treatment).   Subjective: No fever, no chest pain, no nausea, no vomiting.  Still requiring high flow nasal cannula supplementation and expressing mild orthopnea.  Signs of fluid overload also seen with swelling in her legs all the way to the thighs.  Objective:  Vitals:   07/26/19 0538 07/26/19 0725 07/26/19 0734 07/26/19 0739  BP: 131/70     Pulse: 77     Resp: (!) 21     Temp: 98.4 F (36.9 C)     TempSrc: Oral     SpO2: 90% 90% 90% 90%  Weight:      Height:        Intake/Output  Summary (Last 24 hours) at 07/26/2019 0921 Last data filed at 07/26/2019 0629 Gross per 24 hour  Intake 240 ml  Output 1400 ml  Net -1160 ml   Filed Weights   07/25/19 1040 07/25/19 1508  Weight: 96.6 kg 101.9 kg    Examination:  General exam: Appears calm and comfortable; reports breathing is better as she had increasing urine output.  Still requiring higher levels of oxygen supplementation in comparison to home, reporting mild orthopnea and having ongoing swelling in her legs. Respiratory system: Mild expiratory wheezing, fine crackles, no using accessory muscles. Cardiovascular system: S1 & S2 heard, RRR. No JVD, murmurs, rubs, gallops or clicks.  Gastrointestinal system: Abdomen is nondistended, soft and nontender. No organomegaly or masses felt. Normal bowel sounds heard. Central nervous system: Alert and oriented. No focal neurological deficits. Extremities: Chronic stasis dermatitis appreciated in the lower extremities bilaterally; 1+ swelling appreciated bilaterally; no cyanosis or clubbing.  Positive stage II left buttocks pressure injury (present at time of admission; without signs of superimposed infection.) Psychiatry: Judgement and insight appear normal. Mood & affect appropriate.     Data Reviewed: I have personally reviewed following labs and imaging studies  CBC: Recent Labs  Lab 07/25/19 1208  WBC 6.3  NEUTROABS 5.0  HGB 10.8*  HCT 36.8  MCV 84.6  PLT 123456    Basic Metabolic Panel: Recent Labs  Lab 07/25/19 1208 07/26/19 0545  NA 132* 129*  K 4.9 4.5  CL 93* 92*  CO2 26 28  GLUCOSE 97 101*  BUN 24* 23  CREATININE 1.17* 1.18*  CALCIUM 8.3* 8.0*  MG 1.3*  --     GFR: Estimated Creatinine Clearance: 56.3 mL/min (A) (by C-G formula based on SCr of 1.18 mg/dL (H)).  Liver Function Tests: Recent Labs  Lab 07/25/19 1208  AST 15  ALT 16  ALKPHOS 187*  BILITOT 0.9  PROT 7.8  ALBUMIN 3.5    CBG: Recent Labs  Lab 07/25/19 1717 07/25/19 2106  07/26/19 0731  GLUCAP 81 104* 90     Recent Results (from the past 240 hour(s))  SARS Coronavirus 2 by RT PCR (hospital order, performed in [facility] hospital lab) Nasopharyngeal Nasopharyngeal Swab     Status: None   Collection Time: 07/25/19 12:05 PM   Specimen: Nasopharyngeal Swab  Result Value Ref Range Status   SARS Coronavirus 2 NEGATIVE NEGATIVE Final    Comment: (NOTE) SARS-CoV-2 target nucleic acids are NOT DETECTED. The SARS-CoV-2 RNA is generally detectable in upper and lower respiratory specimens during the acute phase of infection. The lowest concentration of SARS-CoV-2 viral copies this assay can detect is 250 copies / mL. A negative result does not preclude SARS-CoV-2 infection and should not be used as the sole basis for treatment or other patient management decisions.  A negative result may occur with improper specimen collection / handling, submission of specimen other than nasopharyngeal swab, presence of viral mutation(s) within the areas targeted by this assay, and inadequate number of viral copies (<250 copies / mL). A negative result must be combined with clinical observations, patient history, and epidemiological  information. Fact Sheet for Patients:   StrictlyIdeas.no Fact Sheet for Healthcare Providers: BankingDealers.co.za This test is not yet approved or cleared  by the [place] FDA and has been authorized for detection and/or diagnosis of SARS-CoV-2 by FDA under an Emergency Use Authorization (EUA).  This EUA will remain in effect (meaning this test can be used) for the duration of the COVID-19 declaration under Section 564(b)(1) of the Act, 21 U.S.C. section 360bbb-3(b)(1), unless the authorization is terminated or revoked sooner. Performed at [facility], [address] [place], [place] [address]          Radiology Studies: DG Chest 2 View  Result Date: 07/25/2019 CLINICAL DATA:   Shortness of breath EXAM: CHEST - 2 VIEW COMPARISON:  01/20/2019 FINDINGS: Heart size appears mildly enlarged, unchanged. Bilateral interstitial prominence without focal airspace consolidation. Low lung volumes. No large pleural fluid collection. No pneumothorax. Advanced degenerative changes of the bilateral shoulders. IMPRESSION: Bilateral interstitial prominence without focal airspace consolidation. Findings are likely accentuated by low lung volumes, however may reflect mild edema versus atypical/viral infection in the appropriate clinical setting. Electronically Signed   By: Davina Poke D.O.   On: 07/25/2019 11:38   CT Angio Chest PE W/Cm &/Or Wo Cm  Result Date: 07/25/2019 CLINICAL DATA:  Shortness of breath and hypoxia, not taking Lasix EXAM: CT ANGIOGRAPHY CHEST WITH CONTRAST TECHNIQUE: Multidetector CT imaging of the chest was performed using the standard protocol during bolus administration of intravenous contrast. Multiplanar CT image reconstructions and MIPs were obtained to evaluate the vascular anatomy. CONTRAST:  1107mL OMNIPAQUE IOHEXOL 350 MG/ML SOLN COMPARISON:  Radiograph 07/25/2019, CT 01/21/2019, CTa 12/15/2018 FINDINGS: Cardiovascular: Satisfactory opacification the pulmonary arteries to the segmental level. No pulmonary artery filling defects are identified. Central pulmonary arterial enlargement is similar to comparison studies and likely reflect some chronic pulmonary artery hypertension. Reflux of contrast into the IVC and hepatic veins is noted. Cardiac size is borderline enlarged. Three-vessel coronary artery disease is noted. No pericardial effusion. Atherosclerotic plaque within the normal caliber aorta. Additional calcifications seen in the normally branching great vessels. Slight tortuosity of the right brachiocephalic vasculature is noted. Mediastinum/Nodes: Mediastinal edematous changes. No mediastinal fluid or gas. Normal thyroid gland and thoracic inlet. No acute  abnormality of the esophagus. Few secretions noted in the otherwise unremarkable trachea. No worrisome mediastinal, hilar or axillary adenopathy. Lungs/Pleura: Redemonstration of the mid to upper lung mosaic attenuation likely accentuated by low lung volumes secondary to imaging during exhalation. Some mild interlobular septal thickening and developing ground-glass opacity throughout the lungs is noted with extensive basilar volume loss and some areas of more dense peribronchovascular opacity in the lung bases. Trace effusions are present. No pneumothorax. Upper Abdomen: Edematous mesenteric changes in the upper abdomen. No other acute abnormality. Musculoskeletal: Diffuse circumferential body wall edema The osseous structures appear diffusely demineralized which may limit detection of small or nondisplaced fractures. No acute osseous abnormality or suspicious osseous lesion. Prior cervical spine fusion. Multilevel degenerative changes are present in the imaged portions of the spine and shoulders. Review of the MIP images confirms the above findings. IMPRESSION: 1. No acute pulmonary artery filling defects to suggest acute pulmonary embolism. 2. Central pulmonary arterial enlargement is similar to comparison studies and likely reflect some chronic pulmonary artery hypertension. 3. Reflux of contrast into the IVC and hepatic veins may suggest elevated right heart pressures/right heart failure. 4. Diffuse body wall edema, mesenteric edema and trace effusions further support a diagnosis of heart failure/volume overload. 5. Mosaic  attenuation of the lungs is a more chronic finding likely accentuated by low lung volumes. Prior HRCT speculated potential chronic hypersensitivity pneumonitis or sarcoid. Correlate with history. 6. More dense peribronchovascular opacity in the lung bases may reflect atelectasis though superimposed infection is not fully excluded. 7. Aortic Atherosclerosis (ICD10-I70.0). Electronically Signed    By: Lovena Le M.D.   On: 07/25/2019 15:37    Scheduled Meds: . arformoterol  15 mcg Nebulization BID  . aspirin  325 mg Oral Daily  . budesonide (PULMICORT) nebulizer solution  0.5 mg Nebulization BID  . doxycycline  100 mg Oral Q12H  . furosemide  40 mg Intravenous Q12H  . heparin  5,000 Units Subcutaneous Q8H  . insulin aspart  0-9 Units Subcutaneous TID WC  . ipratropium-albuterol  3 mL Nebulization BID  . pantoprazole  40 mg Oral BID  . predniSONE  40 mg Oral Q breakfast  . sodium chloride flush  3 mL Intravenous Q12H   Continuous Infusions: . sodium chloride       LOS: 1 day    Time spent: 30 minutes.    Barton Dubois, MD [facility]   To contact the attending provider between 7A-7P or the covering provider during after hours 7P-7A, please log into the web site [email or web address] and access using universal [place] password for that web site. If you do not have the password, please call the hospital operator.  07/26/2019, 9:21 AM

## 2019-07-26 NOTE — Progress Notes (Signed)
*  PRELIMINARY RESULTS* Echocardiogram 2D Echocardiogram has been performed.  Debbie Mullins 07/26/2019, 11:00 AM

## 2019-07-26 NOTE — Plan of Care (Signed)
  Problem: Education: Goal: Knowledge of General Education information will improve Description: Including pain rating scale, medication(s)/side effects and non-pharmacologic comfort measures Outcome: Progressing   Problem: Clinical Measurements: Goal: Will remain free from infection Outcome: Progressing   Problem: Clinical Measurements: Goal: Respiratory complications will improve Outcome: Progressing   Problem: Clinical Measurements: Goal: Cardiovascular complication will be avoided Outcome: Progressing   Problem: Pain Managment: Goal: General experience of comfort will improve Outcome: Progressing   Problem: Safety: Goal: Ability to remain free from injury will improve Outcome: Progressing   Problem: Skin Integrity: Goal: Risk for impaired skin integrity will decrease Outcome: Progressing   Problem: Cardiac: Goal: Ability to achieve and maintain adequate cardiopulmonary perfusion will improve Outcome: Progressing

## 2019-07-27 LAB — BASIC METABOLIC PANEL
Anion gap: 11 (ref 5–15)
BUN: 22 mg/dL (ref 8–23)
CO2: 30 mmol/L (ref 22–32)
Calcium: 8.1 mg/dL — ABNORMAL LOW (ref 8.9–10.3)
Chloride: 90 mmol/L — ABNORMAL LOW (ref 98–111)
Creatinine, Ser: 1.16 mg/dL — ABNORMAL HIGH (ref 0.44–1.00)
GFR calc Af Amer: 58 mL/min — ABNORMAL LOW (ref >60)
GFR calc non Af Amer: 50 mL/min — ABNORMAL LOW (ref >60)
Glucose, Bld: 120 mg/dL — ABNORMAL HIGH (ref 70–99)
Potassium: 4.5 mmol/L (ref 3.5–5.1)
Sodium: 131 mmol/L — ABNORMAL LOW (ref 135–145)

## 2019-07-27 LAB — GLUCOSE, CAPILLARY
Glucose-Capillary: 102 mg/dL — ABNORMAL HIGH (ref 70–99)
Glucose-Capillary: 129 mg/dL — ABNORMAL HIGH (ref 70–99)
Glucose-Capillary: 239 mg/dL — ABNORMAL HIGH (ref 70–99)
Glucose-Capillary: 170 mg/dL — ABNORMAL HIGH (ref 70–99)

## 2019-07-27 NOTE — NC FL2 (Signed)
[place] MEDICAID FL2 LEVEL OF CARE SCREENING TOOL     IDENTIFICATION  Patient Name: Debbie Mullins Birthdate: [birth year] Sex: female Admission Date (Current Location): 07/25/2019  [facility] and [place] Number:  [employer] and Address:  [facility] [address], [place]      Provider Number: [id number]  Attending Physician Name and Address:  Barton Dubois, MD  Relative Name and Phone Number:       Current Level of Care: Hospital Recommended Level of Care: [place] Prior Approval Number:    Date Approved/Denied:   PASRR Number: [id number]  Discharge Plan: SNF    Current Diagnoses: Patient Active Problem List   Diagnosis Date Noted  . Pressure injury of skin 07/26/2019  . Acute on chronic respiratory failure with hypoxia ([place]) 07/25/2019  . Chronic respiratory failure with hypoxia ([place]) 03/02/2019  . Iron deficiency anemia 08/01/2017  . Displaced supracondylar fracture without intracondylar extension of lower end of right femur, initial encounter for closed fracture ([place]) 05/23/2016  . 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 Closed fracture of right distal femur ([place])   . Osteoarthritis of spine with myelopathy, cervical region 11/17/2014  . 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 Pulmonary nodules/lesions, multiple 08/09/2013  . HLD (hyperlipidemia) 01/22/2012  . Pulmonary hypertension ([place]) 01/08/2012  . Acute on chronic diastolic CHF (congestive heart failure) ([place]) 01/05/2012  . Type 2 diabetes mellitus with renal complication ([place]) [id number]  . HTN (hypertension) 01/02/2012  . Tobacco abuse 07/04/2011  . 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 Leukocytosis 02/09/2011  . Mild Thrombocytosis 02/09/2011  . COPD (chronic obstructive pulmonary disease) ([place]) 02/09/2011  . Obesity 02/09/2011    Orientation RESPIRATION BLADDER Height & Weight     Self, Time,  Situation, Place  O2(see dc summary) Continent Weight: 216 lb 4.3 oz (98.1 kg) Height:  5\' 3"  (160 cm)  BEHAVIORAL SYMPTOMS/MOOD NEUROLOGICAL BOWEL NUTRITION STATUS      Continent Diet(see dc summary)  AMBULATORY STATUS COMMUNICATION OF NEEDS Skin   Extensive Assist Verbally Normal                       Personal Care Assistance Level of Assistance  Bathing, Dressing, Feeding Bathing Assistance: Limited assistance Feeding assistance: Independent Dressing Assistance: Limited assistance     Functional Limitations Info  Sight, Speech, Hearing Sight Info: Adequate Hearing Info: Adequate Speech Info: Adequate    SPECIAL CARE FACTORS FREQUENCY  PT (By licensed PT), OT (By licensed OT)     PT Frequency: 5x week OT Frequency: 3x week            Contractures Contractures Info: Not present    Additional Factors Info  Code Status, Allergies Code Status Info: Full Allergies Info: NKA           Current Medications (07/27/2019):  This is the current hospital active medication list Current Facility-Administered Medications  Medication Dose Route Frequency Provider Last Rate Last Admin  . 0.9 %  sodium chloride infusion  250 mL Intravenous PRN Barton Dubois, MD      . acetaminophen (TYLENOL) tablet 650 mg  650 mg Oral Q4H PRN Barton Dubois, MD   650 mg at 07/27/19 0846  . arformoterol (BROVANA) nebulizer solution 15 mcg  15 mcg Nebulization BID Barton Dubois, MD   15 mcg at 07/27/19 0802  . aspirin tablet 325 mg  325 mg Oral Daily Barton Dubois,  MD   325 mg at 07/27/19 0846  . budesonide (PULMICORT) nebulizer solution 0.5 mg  0.5 mg Nebulization BID Barton Dubois, MD   0.5 mg at 07/27/19 0757  . doxycycline (VIBRA-TABS) tablet 100 mg  100 mg Oral Q12H Barton Dubois, MD   100 mg at 07/27/19 0846  . furosemide (LASIX) injection 40 mg  40 mg Intravenous Q12H Barton Dubois, MD   40 mg at 07/27/19 0646  . heparin injection 5,000 Units  5,000 Units Subcutaneous Q8H Barton Dubois, MD   5,000 Units at 07/27/19 [phone]  . insulin aspart (novoLOG) injection 0-9 Units  0-9 Units Subcutaneous TID WC Barton Dubois, MD   1 Units at 07/27/19 1240  . ipratropium-albuterol (DUONEB) 0.5-2.5 (3) MG/3ML nebulizer solution 3 mL  3 mL Nebulization BID Barton Dubois, MD   3 mL at 07/27/19 0752  . ondansetron (ZOFRAN) injection 4 mg  4 mg Intravenous Q6H PRN Barton Dubois, MD      . pantoprazole (PROTONIX) EC tablet 40 mg  40 mg Oral BID Barton Dubois, MD   40 mg at 07/27/19 0846  . predniSONE (DELTASONE) tablet 40 mg  40 mg Oral Q breakfast Barton Dubois, MD   40 mg at 07/27/19 0846  . sodium chloride flush (NS) 0.9 % injection 3 mL  3 mL Intravenous Q12H Barton Dubois, MD   3 mL at 07/27/19 0848  . sodium chloride flush (NS) 0.9 % injection 3 mL  3 mL Intravenous PRN Barton Dubois, MD   3 mL at 07/25/19 2129     Discharge Medications: Please see discharge summary for a list of discharge medications.  Relevant Imaging Results:  Relevant Lab Results:   Additional Information SSN: [id number], [place]

## 2019-07-27 NOTE — Evaluation (Signed)
Physical Therapy Evaluation Patient Details Name: Debbie Mullins MRN: [id number] DOB: Aug 28, 1955 Today's Date: 07/27/2019   History of Present Illness  Debbie Mullins is a 64 y.o. female with medical history significant of chronic respiratory failure with hypoxia due to COPD, type 2 diabetes mellitus with nephropathy, chronic kidney disease a stage IIIa, gastroesophageal flux disease, hypertension, diastolic heart failure and class II obesity; who presented to the hospital secondary to worsening shortness of breath.  Patient expressed symptoms started approximately 4 days prior to admission has steadily continued worsening.  She expressed no fever, no chills, no chest pain, no nausea or vomiting.  No dysuria, no hematuria, no melena, no hematochezia.  Positive complaint for shortness of breath on exertion, limiting her activity capacity and associated orthopnea.  Also has noted increase in her lower extremity swelling and associated weeping process.  On arrival to the ED patient found significantly hypoxic and requiring nonrebreather supplementation. After IV lasix given and bronchodilator management stabilized to use high flow [place].    Clinical Impression  Patient demonstrates slow labored movement for sitting up at bedside requiring much time and effort, had most difficulty with sit to stands due to BLE weakness/swelling and limited to ambulation in room due to fatigue and SpO2 dropping from 91% to 87% while on 3 LPM O2.  Patient tolerated sitting up in chair after therapy.  Patient will benefit from continued physical therapy in hospital and recommended venue below to increase strength, balance, endurance for safe ADLs and gait.      Follow Up Recommendations SNF;Supervision for mobility/OOB;Supervision - Intermittent    Equipment Recommendations  None recommended by PT    Recommendations for Other Services       Precautions / Restrictions Precautions Precautions:  Fall Restrictions Weight Bearing Restrictions: No      Mobility  Bed Mobility Overal bed mobility: Needs Assistance Bed Mobility: Supine to Sit     Supine to sit: Min guard     General bed mobility comments: slow labored movement  Transfers Overall transfer level: Needs assistance Equipment used: Rolling walker (2 wheeled) Transfers: Sit to/from [facility] Sit to Stand: Mod assist Stand pivot transfers: Min assist       General transfer comment: had difficulty with sit to stands due to BLE weakness  Ambulation/Gait Ambulation/Gait assistance: Min assist Gait Distance (Feet): 20 Feet Assistive device: Rolling walker (2 wheeled) Gait Pattern/deviations: Decreased step length - right;Decreased step length - left;Decreased stride length Gait velocity: decreased   General Gait Details: limited to ambulation in room due to fatigue demonstrating slow labored movement with audible bone on bone contact in hips  Stairs            Wheelchair Mobility    Modified Rankin (Stroke Patients Only)       Balance Overall balance assessment: Needs assistance Sitting-balance support: Feet supported;No upper extremity supported Sitting balance-Leahy Scale: Good Sitting balance - Comments: seated at EOB   Standing balance support: During functional activity;Bilateral upper extremity supported Standing balance-Leahy Scale: Fair Standing balance comment: using RW                             Pertinent Vitals/Pain Pain Assessment: No/denies pain    Home Living Family/patient expects to be discharged to:: Private residence Living Arrangements: Other relatives(cousin) Available Help at Discharge: Family;Available PRN/intermittently Type of Home: Mobile home Home Access: Ramped entrance     Home Layout: One level Home  Equipment: Gilford Rile - 4 wheels;Bedside commode;Grab bars - tub/shower;Shower seat      Prior Function Level of Independence:  Needs assistance   Gait / Transfers Assistance Needed: household ambulator  ADL's / Homemaking Assistance Needed: assisted by family for community ADL's        Hand Dominance   Dominant Hand: Right    Extremity/Trunk Assessment   Upper Extremity Assessment Upper Extremity Assessment: Generalized weakness    Lower Extremity Assessment Lower Extremity Assessment: Generalized weakness    Cervical / Trunk Assessment Cervical / Trunk Assessment: Kyphotic  Communication   Communication: No difficulties  Cognition Arousal/Alertness: Awake/alert Behavior During Therapy: WFL for tasks assessed/performed Overall Cognitive Status: Within Functional Limits for tasks assessed                                        General Comments      Exercises     Assessment/Plan    PT Assessment Patient needs continued PT services  PT Problem List Decreased strength;Decreased activity tolerance;Decreased balance;Decreased mobility       PT Treatment Interventions Gait training;Functional mobility training;Therapeutic activities;Therapeutic exercise;Patient/family education;Stair training    PT Goals (Current goals can be found in the Care Plan section)  Acute Rehab PT Goals Patient Stated Goal: return home with family to assist PT Goal Formulation: With patient Time For Goal Achievement: 08/10/19 Potential to Achieve Goals: Good    Frequency Min 3X/week   Barriers to discharge        Co-evaluation               AM-PAC PT "6 Clicks" Mobility  Outcome Measure Help needed turning from your back to your side while in a flat bed without using bedrails?: None Help needed moving from lying on your back to sitting on the side of a flat bed without using bedrails?: A Little Help needed moving to and from a bed to a chair (including a wheelchair)?: A Little Help needed standing up from a chair using your arms (e.g., wheelchair or bedside chair)?: A Lot Help  needed to walk in hospital room?: A Lot Help needed climbing 3-5 steps with a railing? : A Lot 6 Click Score: 16    End of Session Equipment Utilized During Treatment: Oxygen Activity Tolerance: Patient tolerated treatment well;Patient limited by fatigue Patient left: in chair;with call bell/phone within reach Nurse Communication: Mobility status PT Visit Diagnosis: Unsteadiness on feet (R26.81);Other abnormalities of gait and mobility (R26.89);Muscle weakness (generalized) (M62.81)    Time: 1006-1030 PT Time Calculation (min) (ACUTE ONLY): 24 min   Charges:   PT Evaluation $PT Eval Moderate Complexity: 1 Mod PT Treatments $Therapeutic Activity: 23-37 mins        12:32 PM, 07/27/19 Lonell Grandchild, MPT Physical Therapist with [facility] [phone] office [phone] mobile phone

## 2019-07-27 NOTE — Plan of Care (Signed)

## 2019-07-27 NOTE — Progress Notes (Signed)
Pt remains up in chair at present. States arthritis pain in legs eased with oral Tylenol given earlier today. Denies c/o at present.

## 2019-07-27 NOTE — Plan of Care (Signed)
  Problem: Acute Rehab PT Goals(only PT should resolve) Goal: Pt Will Go Supine/Side To Sit Flowsheets (Taken 07/27/2019 1233) Pt will go Supine/Side to Sit:  with modified independence  with supervision Goal: Patient Will Transfer Sit To/From Stand Flowsheets (Taken 07/27/2019 1233) Patient will transfer sit to/from stand: with minimal assist Goal: Pt Will Transfer Bed To Chair/Chair To Bed Flowsheets (Taken 07/27/2019 1233) Pt will Transfer Bed to Chair/Chair to Bed:  with min assist  min guard assist Goal: Pt Will Ambulate Flowsheets (Taken 07/27/2019 1233) Pt will Ambulate:  50 feet  with min guard assist  with minimal assist  with rolling walker   12:33 PM, 07/27/19 Lonell Grandchild, MPT Physical Therapist with [facility] [phone] office [phone] mobile phone

## 2019-07-27 NOTE — TOC Initial Note (Signed)
Transition of Care [facility]) - Initial/Assessment Note    Patient Details  Name: Debbie Mullins MRN: [id number] Date of Birth: April 07, 1955  [facility] CM/SW Contact:    Shade Flood, LCSW Phone Number: 07/27/2019, 1:16 PM  Clinical Narrative:                  Pt admitted from home. PT recommending SNF rehab. Met with pt today to assess. Pt lives alone and is agreeable to SNF rehab. Reviewed SNF options and CMS ratings. Pt requests referral to [facility]. Will send referral, start insurance auth, and follow for dc planning.  Expected Discharge Plan: Skilled Nursing Facility Barriers to Discharge: Continued Medical Work up   Patient Goals and CMS Choice Patient states their goals for this hospitalization and ongoing recovery are:: rehab and go home CMS Medicare.gov Compare Post Acute Care list provided to:: Patient Choice offered to / list presented to : Patient  Expected Discharge Plan and Services Expected Discharge Plan: [place] In-house Referral: Clinical Social Work   Post Acute Care Choice: [place] Living arrangements for the past 2 months: [place]                                      Prior Living Arrangements/Services Living arrangements for the past 2 months: Single Family Home Lives with:: Self Patient language and need for interpreter reviewed:: Yes Do you feel safe going back to the place where you live?: Yes      Need for Family Participation in Patient Care: No (Comment) Care giver support system in place?: No (comment) Current home services: DME Criminal Activity/Legal Involvement Pertinent to Current Situation/Hospitalization: No - Comment as needed  Activities of Daily Living Home Assistive Devices/Equipment: Gilford Rile (specify type) ADL Screening (condition at time of admission) Patient's cognitive ability adequate to safely complete daily activities?: Yes Is the patient deaf or have  difficulty hearing?: No Does the patient have difficulty seeing, even when wearing glasses/contacts?: No Does the patient have difficulty concentrating, remembering, or making decisions?: No Patient able to express need for assistance with ADLs?: Yes Does the patient have difficulty dressing or bathing?: No Independently performs ADLs?: Yes (appropriate for developmental age) Does the patient have difficulty walking or climbing stairs?: Yes Weakness of Legs: Both Weakness of Arms/Hands: Both  Permission Sought/Granted Permission sought to share information with : Chartered certified accountant granted to share information with : Yes, Verbal Permission Granted     Permission granted to share info w AGENCY: [facility]        Emotional Assessment Appearance:: Appears stated age Attitude/Demeanor/Rapport: Engaged Affect (typically observed): Pleasant Orientation: : Oriented to Self, Oriented to Place, Oriented to  Time, Oriented to Situation Alcohol / Substance Use: Not Applicable Psych Involvement: No (comment)  Admission diagnosis:  Acute on chronic respiratory failure with hypoxia (HCC) [J96.21] Chronic obstructive pulmonary disease, unspecified COPD type ([place]) [J44.9] Congestive heart failure, unspecified HF chronicity, unspecified heart failure type ([place]) [I50.9] Patient Active Problem List   Diagnosis Date Noted  . Pressure injury of skin 07/26/2019  . Acute on chronic respiratory failure with hypoxia ([place]) 07/25/2019  . Chronic respiratory failure with hypoxia ([place]) 03/02/2019  . Iron deficiency anemia 08/01/2017  . Displaced supracondylar fracture without intracondylar extension of lower end of right femur, initial encounter for closed fracture ([place]) 05/23/2016  . 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 Closed fracture of right distal femur ([place])   . Osteoarthritis of spine with myelopathy, cervical region 11/17/2014  . 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 Pulmonary nodules/lesions, multiple 08/09/2013  . HLD (hyperlipidemia) 01/22/2012  . Pulmonary hypertension ([place]) 01/08/2012  . Acute on chronic diastolic CHF (congestive heart failure) ([place]) 01/05/2012  . Type 2 diabetes mellitus with renal complication ([place]) 94/44/6190  . HTN (hypertension) 01/02/2012  . Tobacco abuse 07/04/2011  . 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 Leukocytosis 02/09/2011  . Mild Thrombocytosis 02/09/2011  . COPD (chronic obstructive pulmonary disease) ([place]) 02/09/2011  . Obesity 02/09/2011   PCP:  Lucianne Lei, MD Pharmacy:   [facility], [address] [place] [address] Phone: [phone] Fax: [phone]  [place], [place] [address] [place] [address] Phone: [phone] Fax: [place] [address], [place] - [place] [address] [place] [address] [place] [address] Phone: [phone] Fax: [phone]     Social Determinants of Health (SDOH) Interventions    Readmission Risk Interventions Readmission Risk Prevention Plan 07/27/2019  Medication Screening Complete  Transportation Screening Complete  Some recent data might be hidden

## 2019-07-27 NOTE — Care Management Important Message (Signed)
Important Message  Patient Details  Name: Debbie Mullins MRN: [id number] Date of Birth: 05/31/1955   Medicare Important Message Given:  Yes     Tommy Medal 07/27/2019, 11:48 AM

## 2019-07-27 NOTE — Progress Notes (Signed)
PROGRESS NOTE    Debbie Mullins  [id number] DOB: 1955-06-05 DOA: 07/25/2019 PCP: Lucianne Lei, MD  Chief Complaint  Patient presents with  . Shortness of Breath    Brief Narrative:  64 y.o. female with medical history significant of chronic respiratory failure with hypoxia due to COPD, type 2 diabetes mellitus with nephropathy, chronic kidney disease a stage IIIa, gastroesophageal flux disease, hypertension, diastolic heart failure and class II obesity; who presented to the hospital secondary to worsening shortness of breath.  Patient expressed symptoms started approximately 4 days prior to admission has steadily continued worsening.  She expressed no fever, no chills, no chest pain, no nausea or vomiting.  No dysuria, no hematuria, no melena, no hematochezia.  Positive complaint for shortness of breath on exertion, limiting her activity capacity and associated orthopnea.  Also has noted increase in her lower extremity swelling and associated weeping process.  On arrival to the ED patient found significantly hypoxic and requiring nonrebreather supplementation. After IV lasix given and bronchodilator management stabilized to use high flow [place].  Of note, patient uses 2.5-3 L nasal cannula supplementation chronically at home.  When attempted to place back on this amount of oxygen her O2 sat dropped into the low 80s.   Assessment & Plan: 1-acute on chronic respiratory failure with hypoxia ([place]): Due to COPD exacerbation and CHF exacerbation. -Continue telemetry monitoring -Continue IV diuresis -Continue to follow electrolytes and renal function on a daily basis with repletion as needed. -Continue to follow daily weights and strict I's and O's; Place TED hoses. -Follow 2D echo results. -Patient expressed breathing is improving; currently with very little wheezing.  -Continue oral prednisone, nebulizer management and oral doxycycline. -Continue to wean oxygen supplementation down as  tolerated to baseline.  2-class II obesity Body mass index is 38.31 kg/m. -Low calorie diet and portion control has been discussed with patient.  3-Type 2 diabetes mellitus with renal complication (HCC) -Continue holding oral hypoglycemic agents -Continue modified carbohydrate diet -Follow CBGs -Continue sliding scale insulin. 5-A1C 6.3  4-chronic kidney disease is stage IIIa -Continue to minimize nephrotoxic agents -Continue to follow renal function trend in the setting of acute diuresis -Advised to maintain adequate oral hydration.  5-essential hypertension -Appears to be well controlled and stable currently. -Continue heart healthy diet (specifically low-sodium diet, less than 2.5 g daily). -Follow vital signs.  6-GERD -Continue PPI.  7-physical deconditioning -Appreciate evaluation by PT -Recommendations given for skilled nursing facility at discharge for further care and rehabilitation. -Patient in agreement. -Social work has been made aware.     DVT prophylaxis: (Lovenox) Code Status: Full code Family Communication: No family at bedside. Disposition:   Status is: Inpatient   Dispo: The patient is from: Home              Anticipated d/c is to: Skilled nursing facility.              Anticipated d/c date is: Hopefully 24-48 hours.              Patient currently not medically stable, still requiring higher level of oxygen supplementation (in comparison to home chronic supplementation) and with signs of fluid overload on examination.  Continue IV diuresis and further management for COPD exacerbation.        Consultants:   None    Procedures:  See below for x-ray reports   Antimicrobials:  doxycycline (planning for a total of 5 days treatment).   Subjective: No fever, no chest pain, no  nausea, no vomiting.  Oxygen supplementation improving and albuterol getting better.  Still short of breath on exertion and having mild orthopnea.  Signs of fluid  overload appreciated on exam.  Objective: Vitals:   07/27/19 0752 07/27/19 0757 07/27/19 0802 07/27/19 1417  BP:    (!) 137/92  Pulse:    83  Resp:    20  Temp:    97.8 F (36.6 C)  TempSrc:      SpO2: 99% 99% 99% 100%  Weight:      Height:        Intake/Output Summary (Last 24 hours) at 07/27/2019 1558 Last data filed at 07/27/2019 0848 Gross per 24 hour  Intake 693 ml  Output 2550 ml  Net -1857 ml   Filed Weights   07/25/19 1040 07/25/19 1508 07/27/19 0500  Weight: 96.6 kg 101.9 kg 98.1 kg    Examination: General exam: Alert, awake, oriented x 3; feeling much better and expressing improvement in her breathing.  Currently requiring 4 L nasal cannula supplementation (patient chronically uses 3); still short of breath with activity and having mild orthopnea.  Reports overall swelling getting better. Respiratory system: Improved air movement bilaterally; positive fine crackles at the bases.  No expiratory wheezing currently.  No using accessory muscles. Cardiovascular system:RRR.  No rubs or gallops; no JVD. Gastrointestinal system: Abdomen is nondistended, soft and nontender. No organomegaly or masses felt. Normal bowel sounds heard. Central nervous system: Alert and oriented. No focal neurological deficits. Extremities: No cyanosis or clubbing. Skin: No petechiae; chronic abscesses dermatitis appreciated in her lower extremities bilaterally, 1+ swelling edema up to her thighs.  Positive stage II left buttocks pressure injury (present at time of admission) without signs of superimposed infection. Psychiatry: Judgement and insight appear normal. Mood & affect appropriate.   Data Reviewed: I have personally reviewed following labs and imaging studies  CBC: Recent Labs  Lab 07/25/19 1208  WBC 6.3  NEUTROABS 5.0  HGB 10.8*  HCT 36.8  MCV 84.6  PLT 123456    Basic Metabolic Panel: Recent Labs  Lab 07/25/19 1208 07/26/19 0545 07/27/19 0508  NA 132* 129* 131*  K 4.9 4.5  4.5  CL 93* 92* 90*  CO2 26 28 30   GLUCOSE 97 101* 120*  BUN 24* 23 22  CREATININE 1.17* 1.18* 1.16*  CALCIUM 8.3* 8.0* 8.1*  MG 1.3*  --   --     GFR: Estimated Creatinine Clearance: 56.1 mL/min (A) (by C-G formula based on SCr of 1.16 mg/dL (H)).  Liver Function Tests: Recent Labs  Lab 07/25/19 1208  AST 15  ALT 16  ALKPHOS 187*  BILITOT 0.9  PROT 7.8  ALBUMIN 3.5    CBG: Recent Labs  Lab 07/26/19 1134 07/26/19 1638 07/26/19 2125 07/27/19 0735 07/27/19 1110  GLUCAP 116* 176* 147* 102* 129*     Recent Results (from the past 240 hour(s))  SARS Coronavirus 2 by RT PCR (hospital order, performed in [facility] hospital lab) Nasopharyngeal Nasopharyngeal Swab     Status: None   Collection Time: 07/25/19 12:05 PM   Specimen: Nasopharyngeal Swab  Result Value Ref Range Status   SARS Coronavirus 2 NEGATIVE NEGATIVE Final    Comment: (NOTE) SARS-CoV-2 target nucleic acids are NOT DETECTED. The SARS-CoV-2 RNA is generally detectable in upper and lower respiratory specimens during the acute phase of infection. The lowest concentration of SARS-CoV-2 viral copies this assay can detect is 250 copies / mL. A negative result does not preclude SARS-CoV-2 infection  and should not be used as the sole basis for treatment or other patient management decisions.  A negative result may occur with improper specimen collection / handling, submission of specimen other than nasopharyngeal swab, presence of viral mutation(s) within the areas targeted by this assay, and inadequate number of viral copies (<250 copies / mL). A negative result must be combined with clinical observations, patient history, and epidemiological information. Fact Sheet for Patients:   StrictlyIdeas.no Fact Sheet for Healthcare Providers: BankingDealers.co.za This test is not yet approved or cleared  by the [place] FDA and has been authorized for detection  and/or diagnosis of SARS-CoV-2 by FDA under an Emergency Use Authorization (EUA).  This EUA will remain in effect (meaning this test can be used) for the duration of the COVID-19 declaration under Section 564(b)(1) of the Act, 21 U.S.C. section 360bbb-3(b)(1), unless the authorization is terminated or revoked sooner. Performed at [facility], [address], [place],  [address]          Radiology Studies: ECHOCARDIOGRAM COMPLETE  Result Date: 07/26/2019    ECHOCARDIOGRAM REPORT   Patient Name:   Debbie Mullins Date of Exam: 07/26/2019 Medical Rec #:  [id number]          Height:       63.0 in Accession #:    [id number]         Weight:       224.6 lb Date of Birth:  28-Oct-1955          BSA:          2.032 m Patient Age:    47 years           BP:           131/70 mmHg Patient Gender: F                  HR:           76 bpm. Exam Location:  [facility] Procedure: 2D Echo, Cardiac Doppler and Color Doppler Indications:    Congestive Heart Failure 428.0 / I50.9  History:        Patient has prior history of Echocardiogram examinations, most                 recent 03/04/2012. COPD; Risk Factors:Hypertension, Diabetes and                 Dyslipidemia. Tobacco abuse.  Sonographer:    Alvino Chapel RCS Referring Phys: [place]  1. Left ventricular ejection fraction, by estimation, is 60 to 65%. The left ventricle has normal function. The left ventricle has no regional wall motion abnormalities. Left ventricular diastolic parameters are consistent with Grade I diastolic dysfunction (impaired relaxation). There is the interventricular septum is flattened in systole and diastole, consistent with right ventricular pressure and volume overload.  2. Right ventricular systolic function is severely reduced. The right ventricular size is severely enlarged. There is severely elevated pulmonary artery systolic pressure. The estimated right ventricular systolic pressure is 0000000 mmHg.  3.  Right atrial size was moderately dilated.  4. The mitral valve is grossly normal. Trivial mitral valve regurgitation.  5. Tricuspid valve regurgitation is moderate.  6. The aortic valve is tricuspid. Aortic valve regurgitation is not visualized.  7. The inferior vena cava is dilated in size with <50% respiratory variability, suggesting right atrial pressure of 15 mmHg. FINDINGS  Left Ventricle: Left ventricular ejection fraction, by estimation, is 60 to 65%. The  left ventricle has normal function. The left ventricle has no regional wall motion abnormalities. The left ventricular internal cavity size was normal in size. There is  no left ventricular hypertrophy. The interventricular septum is flattened in systole and diastole, consistent with right ventricular pressure and volume overload. Left ventricular diastolic parameters are consistent with Grade I diastolic dysfunction (impaired relaxation). Right Ventricle: The right ventricular size is severely enlarged. No increase in right ventricular wall thickness. Right ventricular systolic function is severely reduced. There is severely elevated pulmonary artery systolic pressure. The tricuspid regurgitant velocity is 3.36 m/s, and with an assumed right atrial pressure of 15 mmHg, the estimated right ventricular systolic pressure is 0000000 mmHg. Left Atrium: Left atrial size was normal in size. Right Atrium: Right atrial size was moderately dilated. Pericardium: There is no evidence of pericardial effusion. Mitral Valve: The mitral valve is grossly normal. Trivial mitral valve regurgitation. Tricuspid Valve: The tricuspid valve is grossly normal. Tricuspid valve regurgitation is moderate. Aortic Valve: The aortic valve is tricuspid. Aortic valve regurgitation is not visualized. Mild aortic valve annular calcification. Pulmonic Valve: The pulmonic valve was grossly normal. Pulmonic valve regurgitation is trivial. Aorta: The aortic root is normal in size and structure.  Venous: The inferior vena cava is dilated in size with less than 50% respiratory variability, suggesting right atrial pressure of 15 mmHg. IAS/Shunts: No atrial level shunt detected by color flow Doppler.  LEFT VENTRICLE PLAX 2D LVIDd:         3.74 cm  Diastology LVIDs:         2.43 cm  LV e' lateral:   10.90 cm/s LV PW:         1.00 cm  LV E/e' lateral: 6.7 LV IVS:        0.90 cm  LV e' medial:    4.35 cm/s LVOT diam:     1.70 cm  LV E/e' medial:  16.7 LV SV:         47 LV SV Index:   23 LVOT Area:     2.27 cm  RIGHT VENTRICLE RV S prime:     6.85 cm/s TAPSE (M-mode): 0.9 cm LEFT ATRIUM             Index       RIGHT ATRIUM           Index LA diam:        4.30 cm 2.12 cm/m  RA Area:     19.80 cm LA Vol (A2C):   47.8 ml 23.53 ml/m RA Volume:   66.90 ml  32.93 ml/m LA Vol (A4C):   36.3 ml 17.87 ml/m LA Biplane Vol: 42.9 ml 21.12 ml/m  AORTIC VALVE LVOT Vmax:   96.60 cm/s LVOT Vmean:  66.200 cm/s LVOT VTI:    0.205 m  AORTA Ao Root diam: 2.80 cm MITRAL VALVE                TRICUSPID VALVE MV Area (PHT): 3.17 cm     TR Peak grad:   45.2 mmHg MV Decel Time: 239 msec     TR Vmax:        336.00 cm/s MV E velocity: 72.80 cm/s MV A velocity: 113.00 cm/s  SHUNTS MV E/A ratio:  0.64         Systemic VTI:  0.20 m  Systemic Diam: 1.70 cm Rozann Lesches MD Electronically signed by Rozann Lesches MD Signature Date/Time: 07/26/2019/11:16:38 AM    Final     Scheduled Meds: . arformoterol  15 mcg Nebulization BID  . aspirin  325 mg Oral Daily  . budesonide (PULMICORT) nebulizer solution  0.5 mg Nebulization BID  . doxycycline  100 mg Oral Q12H  . furosemide  40 mg Intravenous Q12H  . heparin  5,000 Units Subcutaneous Q8H  . insulin aspart  0-9 Units Subcutaneous TID WC  . ipratropium-albuterol  3 mL Nebulization BID  . pantoprazole  40 mg Oral BID  . predniSONE  40 mg Oral Q breakfast  . sodium chloride flush  3 mL Intravenous Q12H   Continuous Infusions: . sodium chloride        LOS: 2 days    Time spent: 30 minutes.    Barton Dubois, MD [facility]   To contact the attending provider between 7A-7P or the covering provider during after hours 7P-7A, please log into the web site [email or web address] and access using universal [place] password for that web site. If you do not have the password, please call the hospital operator.  07/27/2019, 3:58 PM

## 2019-07-27 NOTE — Progress Notes (Signed)
Central tele called to report that pt had non-sustained 3 beat run of Vtach. Pt currently in NSR in the 80"s. Pt denies c/o at present, alert, oriented, skin warm and dry, no c/o SOB (O2 at 4lpm [place] in place.) MD [facility] notified.

## 2019-07-28 LAB — BASIC METABOLIC PANEL
Anion gap: 10 (ref 5–15)
BUN: 23 mg/dL (ref 8–23)
CO2: 32 mmol/L (ref 22–32)
Calcium: 7.7 mg/dL — ABNORMAL LOW (ref 8.9–10.3)
Chloride: 89 mmol/L — ABNORMAL LOW (ref 98–111)
Creatinine, Ser: 1.06 mg/dL — ABNORMAL HIGH (ref 0.44–1.00)
GFR calc Af Amer: >60 mL/min (ref >60)
GFR calc non Af Amer: 56 mL/min — ABNORMAL LOW (ref >60)
Glucose, Bld: 119 mg/dL — ABNORMAL HIGH (ref 70–99)
Potassium: 4 mmol/L (ref 3.5–5.1)
Sodium: 131 mmol/L — ABNORMAL LOW (ref 135–145)

## 2019-07-28 LAB — GLUCOSE, CAPILLARY
Glucose-Capillary: 97 mg/dL (ref 70–99)
Glucose-Capillary: 194 mg/dL — ABNORMAL HIGH (ref 70–99)
Glucose-Capillary: 173 mg/dL — ABNORMAL HIGH (ref 70–99)
Glucose-Capillary: 145 mg/dL — ABNORMAL HIGH (ref 70–99)

## 2019-07-28 NOTE — Progress Notes (Signed)
MD notified of 7 beat run of V tach. Pt lying in bed resting, alert and verbal with no distress. Call bell in reach.

## 2019-07-28 NOTE — Progress Notes (Signed)
[employer] [facility]er received for [facility]. Plan to discharge there tomorrow.

## 2019-07-28 NOTE — Progress Notes (Signed)
Physical Therapy Treatment Patient Details Name: Debbie Mullins MRN: [id number] DOB: 01/24/1956 Today's Date: 07/28/2019    History of Present Illness Debbie Mullins is a 64 y.o. female with medical history significant of chronic respiratory failure with hypoxia due to COPD, type 2 diabetes mellitus with nephropathy, chronic kidney disease a stage IIIa, gastroesophageal flux disease, hypertension, diastolic heart failure and class II obesity; who presented to the hospital secondary to worsening shortness of breath.  Patient expressed symptoms started approximately 4 days prior to admission has steadily continued worsening.  She expressed no fever, no chills, no chest pain, no nausea or vomiting.  No dysuria, no hematuria, no melena, no hematochezia.  Positive complaint for shortness of breath on exertion, limiting her activity capacity and associated orthopnea.  Also has noted increase in her lower extremity swelling and associated weeping process.  On arrival to the ED patient found significantly hypoxic and requiring nonrebreather supplementation. After IV lasix given and bronchodilator management stabilized to use high flow [place].    PT Comments    Pt requires min assist to upright trunk and scoot to EOB. Pt able to ambulate further distance with less assist this session, continues to fatigue with ambulation and audible joint popping noted without pain complaints. Pt tolerates therapeutic exercises and is motivated to get stronger, able to recall exercises so she can perform additional sets later today. Pt tolerates remaining up in chair with call bell and nurse tech in room. Patient will benefit from continued physical therapy in hospital and recommendations below to increase strength, balance, endurance for safe ADLs and gait.    Follow Up Recommendations  SNF;Supervision for mobility/OOB;Supervision - Intermittent     Equipment Recommendations  None recommended by PT    Recommendations  for Other Services       Precautions / Restrictions Precautions Precautions: Fall Precaution Comments: supplemental O2 Restrictions Weight Bearing Restrictions: No    Mobility  Bed Mobility Overal bed mobility: Needs Assistance Bed Mobility: Supine to Sit  Supine to sit: Min assist  General bed mobility comments: slow, labored movement, assist to fully upright trunk and scoot to EOB  Transfers Overall transfer level: Needs assistance Equipment used: Rolling walker (2 wheeled) Transfers: Sit to/from Stand Sit to Stand: Mod assist  General transfer comment: cues to push from seated surface and through bil flat feet to rise from EOB, mod assist to power up, good steadiness once standing  Ambulation/Gait Ambulation/Gait assistance: Min guard Gait Distance (Feet): 60 Feet Assistive device: Rolling walker (2 wheeled) Gait Pattern/deviations: Step-through pattern;Decreased stride length Gait velocity: decreased   General Gait Details: slow, step through gait pattern with cues to maintain RW at closer proximity to body, moderate increased work of breathing, audible joint popping with steps but pt denies pain   Stairs          Wheelchair Mobility    Modified Rankin (Stroke Patients Only)       Balance Overall balance assessment: Needs assistance Sitting-balance support: Feet supported;No upper extremity supported Sitting balance-Leahy Scale: Good Sitting balance - Comments: seated EOB   Standing balance support: During functional activity;Bilateral upper extremity supported Standing balance-Leahy Scale: Fair Standing balance comment: with RW        Cognition Arousal/Alertness: Awake/alert Behavior During Therapy: WFL for tasks assessed/performed Overall Cognitive Status: Within Functional Limits for tasks assessed           Exercises General Exercises - Lower Extremity Ankle Circles/Pumps: Seated;Both;10 reps(LE extended in recliner) Long Arc Quad:  Seated;Both;10 reps Hip ABduction/ADduction: Seated;Both;10 reps(LE extended in recliner) Straight Leg Raises: Seated;Both;10 reps(LE extended in recliner) Hip Flexion/Marching: Seated;Both;10 reps    General Comments General comments (skin integrity, edema, etc.): on 4L O2, SpO2 97% while supine, 97% while seated at EOB, desat to 89% with ambulation but able to improve >92% with seated pursed lip breathing, 96% at EOS      Pertinent Vitals/Pain Pain Assessment: No/denies pain    Home Living               Prior Function            PT Goals (current goals can now be found in the care plan section) Acute Rehab PT Goals Patient Stated Goal: return home with family to assist PT Goal Formulation: With patient Time For Goal Achievement: 08/10/19 Potential to Achieve Goals: Good Progress towards PT goals: Progressing toward goals    Frequency    Min 3X/week      PT Plan Current plan remains appropriate    Co-evaluation              AM-PAC PT "6 Clicks" Mobility   Outcome Measure  Help needed turning from your back to your side while in a flat bed without using bedrails?: A Little Help needed moving from lying on your back to sitting on the side of a flat bed without using bedrails?: A Little Help needed moving to and from a bed to a chair (including a wheelchair)?: A Little Help needed standing up from a chair using your arms (e.g., wheelchair or bedside chair)?: A Lot Help needed to walk in hospital room?: A Little Help needed climbing 3-5 steps with a railing? : A Lot 6 Click Score: 16    End of Session Equipment Utilized During Treatment: Oxygen(4L) Activity Tolerance: Patient tolerated treatment well Patient left: in chair;with call bell/phone within reach;with nursing/sitter in room Nurse Communication: Mobility status PT Visit Diagnosis: Unsteadiness on feet (R26.81);Other abnormalities of gait and mobility (R26.89);Muscle weakness (generalized)  (M62.81)     Time: [id number] PT Time Calculation (min) (ACUTE ONLY): 26 min  Charges:  $Gait Training: 8-22 mins $Therapeutic Exercise: 8-22 mins                      Tori Mishal Probert PT, DPT 07/28/19, 11:09 AM

## 2019-07-28 NOTE — Progress Notes (Signed)
Per tele pt had 7 beat run of V tach. Alert and verbal, denies any distress. MD notified. Call bell in reach.

## 2019-07-28 NOTE — Progress Notes (Signed)
PROGRESS NOTE    Debbie Mullins  [id number] DOB: 06-19-1955 DOA: 07/25/2019 PCP: Lucianne Lei, MD  Chief Complaint  Patient presents with  . Shortness of Breath    Brief Narrative:  64 y.o. female with medical history significant of chronic respiratory failure with hypoxia due to COPD, type 2 diabetes mellitus with nephropathy, chronic kidney disease a stage IIIa, gastroesophageal flux disease, hypertension, diastolic heart failure and class II obesity; who presented to the hospital secondary to worsening shortness of breath.  Patient expressed symptoms started approximately 4 days prior to admission has steadily continued worsening.  She expressed no fever, no chills, no chest pain, no nausea or vomiting.  No dysuria, no hematuria, no melena, no hematochezia.  Positive complaint for shortness of breath on exertion, limiting her activity capacity and associated orthopnea.  Also has noted increase in her lower extremity swelling and associated weeping process.  On arrival to the ED patient found significantly hypoxic and requiring nonrebreather supplementation. After IV lasix given and bronchodilator management stabilized to use high flow [place].  Of note, patient uses 2.5-3 L nasal cannula supplementation chronically at home.  When attempted to place back on this amount of oxygen her O2 sat dropped into the low 80s.   Assessment & Plan: 1-acute on chronic respiratory failure with hypoxia ([place]): Due to COPD exacerbation and CHF exacerbation. -Continue telemetry monitoring -Continue IV diuresis -Continue to follow electrolytes and renal function on a daily basis with repletion as needed. -Continue to follow daily weights and strict I's and O's; Place TED hoses. -Follow 2D echo results. -Patient expressed breathing is improving; currently with very little wheezing.  -Continue oral prednisone, nebulizer management and oral doxycycline. -Continue to wean oxygen supplementation down as  tolerated to baseline.  2-class II obesity Body mass index is 36.79 kg/m. -Low calorie diet and portion control has been discussed with patient.  3-Type 2 diabetes mellitus with renal complication (HCC) -Continue holding oral hypoglycemic agents -Continue modified carbohydrate diet -Follow CBGs -Continue sliding scale insulin. 5-A1C 6.3  4-chronic kidney disease is stage IIIa -Continue to minimize nephrotoxic agents -Continue to follow renal function trend in the setting of acute diuresis -Advised to maintain adequate oral hydration.  5-essential hypertension -Appears to be well controlled and stable currently. -Continue heart healthy diet (specifically low-sodium diet, less than 2.5 g daily). -Follow vital signs.  6-GERD -Continue PPI.  7-physical deconditioning -Appreciate evaluation by PT -Recommendations given for skilled nursing facility at discharge for further care and rehabilitation. -Patient in agreement. -Social work has been made aware.     DVT prophylaxis: (Lovenox) Code Status: Full code Family Communication: No family at bedside. Disposition:   Status is: Inpatient   Dispo: The patient is from: Home              Anticipated d/c is to: Skilled nursing facility.              Anticipated d/c date is: Hopefully in the next 24 hours.              Patient currently reaching medical stability; complete 24 more hours of IV diuresis with future transition to oral regimen and is remained stable discharge to skilled nursing facility for care, conditioning and rehabilitation.  Continue steroids and nebulizer management.    Consultants:   None    Procedures:  See below for x-ray reports   Antimicrobials:  doxycycline (planning for a total of 5 days treatment).   Subjective: Reports good urine output, no  chest pain, no nausea, no vomiting.  Still with mild orthopnea and feeling short of breath on exertion.  Also expressing feeling weak and deconditioned.   3.5 L nasal cannula in place.  Objective: Vitals:   07/28/19 0813 07/28/19 0821 07/28/19 0825 07/28/19 1528  BP:    124/70  Pulse:    86  Resp:    20  Temp:    99 F (37.2 C)  TempSrc:      SpO2: 100% 100% 100% 99%  Weight:      Height:        Intake/Output Summary (Last 24 hours) at 07/28/2019 1558 Last data filed at 07/28/2019 0556 Gross per 24 hour  Intake --  Output 2400 ml  Net -2400 ml   Filed Weights   07/25/19 1508 07/27/19 0500 07/28/19 0522  Weight: 101.9 kg 98.1 kg 94.2 kg    Examination: General exam: Alert, awake, oriented x 3; feeling significantly better and breathing easier.  Expressed overall swelling also decreasing.  3.5 L nasal cannula supplementation in place (chronically on 3 L).  Reports no chest pain, no nausea, no vomiting. Respiratory system: Decreased breath sounds at the bases, no frank crackles.  No wheezing, no using accessory muscles.  Normal respiratory effort. Cardiovascular system:RRR. No murmurs, rubs, gallops.  Unable to properly assess JVD with body habitus. Gastrointestinal system: Abdomen is nondistended, soft and nontender. No organomegaly or masses felt. Normal bowel sounds heard. Central nervous system: Alert and oriented. No focal neurological deficits. Extremities: No cyanosis or clubbing; 1+ edema bilaterally appreciated all the way to her thighs. Skin: No rashes, no petechiae; chronic stasis dermatitis changes appreciated on both legs bilaterally.  Stage II left buttocks pressure injury (present at time of admission) without signs of superimposed infection once again appreciated. Psychiatry: Judgement and insight appear normal. Mood & affect appropriate.    Data Reviewed: I have personally reviewed following labs and imaging studies  CBC: Recent Labs  Lab 07/25/19 1208  WBC 6.3  NEUTROABS 5.0  HGB 10.8*  HCT 36.8  MCV 84.6  PLT 123456    Basic Metabolic Panel: Recent Labs  Lab 07/25/19 1208 07/26/19 0545 07/27/19 0508  07/28/19 0454  NA 132* 129* 131* 131*  K 4.9 4.5 4.5 4.0  CL 93* 92* 90* 89*  CO2 26 28 30  32  GLUCOSE 97 101* 120* 119*  BUN 24* 23 22 23   CREATININE 1.17* 1.18* 1.16* 1.06*  CALCIUM 8.3* 8.0* 8.1* 7.7*  MG 1.3*  --   --   --     GFR: Estimated Creatinine Clearance: 60 mL/min (A) (by C-G formula based on SCr of 1.06 mg/dL (H)).  Liver Function Tests: Recent Labs  Lab 07/25/19 1208  AST 15  ALT 16  ALKPHOS 187*  BILITOT 0.9  PROT 7.8  ALBUMIN 3.5    CBG: Recent Labs  Lab 07/27/19 1110 07/27/19 1619 07/27/19 2042 07/28/19 0752 07/28/19 1123  GLUCAP 129* 239* 170* 97 194*     Recent Results (from the past 240 hour(s))  SARS Coronavirus 2 by RT PCR (hospital order, performed in [facility] hospital lab) Nasopharyngeal Nasopharyngeal Swab     Status: None   Collection Time: 07/25/19 12:05 PM   Specimen: Nasopharyngeal Swab  Result Value Ref Range Status   SARS Coronavirus 2 NEGATIVE NEGATIVE Final    Comment: (NOTE) SARS-CoV-2 target nucleic acids are NOT DETECTED. The SARS-CoV-2 RNA is generally detectable in upper and lower respiratory specimens during the acute phase of infection. The  lowest concentration of SARS-CoV-2 viral copies this assay can detect is 250 copies / mL. A negative result does not preclude SARS-CoV-2 infection and should not be used as the sole basis for treatment or other patient management decisions.  A negative result may occur with improper specimen collection / handling, submission of specimen other than nasopharyngeal swab, presence of viral mutation(s) within the areas targeted by this assay, and inadequate number of viral copies (<250 copies / mL). A negative result must be combined with clinical observations, patient history, and epidemiological information. Fact Sheet for Patients:   StrictlyIdeas.no Fact Sheet for Healthcare Providers: BankingDealers.co.za This test is not yet  approved or cleared  by the [place] FDA and has been authorized for detection and/or diagnosis of SARS-CoV-2 by FDA under an Emergency Use Authorization (EUA).  This EUA will remain in effect (meaning this test can be used) for the duration of the COVID-19 declaration under Section 564(b)(1) of the Act, 21 U.S.C. section 360bbb-3(b)(1), unless the authorization is terminated or revoked sooner. Performed at [facility], [address] [place], [place] [address]          Radiology Studies: No results found.  Scheduled Meds: . arformoterol  15 mcg Nebulization BID  . aspirin  325 mg Oral Daily  . budesonide (PULMICORT) nebulizer solution  0.5 mg Nebulization BID  . doxycycline  100 mg Oral Q12H  . furosemide  40 mg Intravenous Q12H  . heparin  5,000 Units Subcutaneous Q8H  . insulin aspart  0-9 Units Subcutaneous TID WC  . ipratropium-albuterol  3 mL Nebulization BID  . pantoprazole  40 mg Oral BID  . predniSONE  40 mg Oral Q breakfast  . sodium chloride flush  3 mL Intravenous Q12H   Continuous Infusions: . sodium chloride       LOS: 3 days    Time spent: 30 minutes.    Barton Dubois, MD [facility]   To contact the attending provider between 7A-7P or the covering provider during after hours 7P-7A, please log into the web site [email or web address] and access using universal [place] password for that web site. If you do not have the password, please call the hospital operator.  07/28/2019, 3:58 PM

## 2019-07-29 DIAGNOSIS — N181 Chronic kidney disease, stage 1: Secondary | ICD-10-CM

## 2019-07-29 DIAGNOSIS — J449 Chronic obstructive pulmonary disease, unspecified: Secondary | ICD-10-CM | POA: Diagnosis not present

## 2019-07-29 DIAGNOSIS — N1832 Chronic kidney disease, stage 3b: Secondary | ICD-10-CM | POA: Diagnosis not present

## 2019-07-29 DIAGNOSIS — M6281 Muscle weakness (generalized): Secondary | ICD-10-CM | POA: Diagnosis not present

## 2019-07-29 DIAGNOSIS — K219 Gastro-esophageal reflux disease without esophagitis: Secondary | ICD-10-CM | POA: Diagnosis not present

## 2019-07-29 DIAGNOSIS — E1122 Type 2 diabetes mellitus with diabetic chronic kidney disease: Secondary | ICD-10-CM | POA: Diagnosis not present

## 2019-07-29 DIAGNOSIS — J441 Chronic obstructive pulmonary disease with (acute) exacerbation: Secondary | ICD-10-CM | POA: Diagnosis not present

## 2019-07-29 DIAGNOSIS — I5033 Acute on chronic diastolic (congestive) heart failure: Secondary | ICD-10-CM | POA: Diagnosis not present

## 2019-07-29 DIAGNOSIS — R5381 Other malaise: Secondary | ICD-10-CM

## 2019-07-29 DIAGNOSIS — I10 Essential (primary) hypertension: Secondary | ICD-10-CM | POA: Diagnosis not present

## 2019-07-29 DIAGNOSIS — D649 Anemia, unspecified: Secondary | ICD-10-CM | POA: Diagnosis not present

## 2019-07-29 DIAGNOSIS — J9621 Acute and chronic respiratory failure with hypoxia: Secondary | ICD-10-CM | POA: Diagnosis not present

## 2019-07-29 DIAGNOSIS — Z743 Need for continuous supervision: Secondary | ICD-10-CM | POA: Diagnosis not present

## 2019-07-29 DIAGNOSIS — Z7401 Bed confinement status: Secondary | ICD-10-CM | POA: Diagnosis not present

## 2019-07-29 DIAGNOSIS — I509 Heart failure, unspecified: Secondary | ICD-10-CM | POA: Diagnosis not present

## 2019-07-29 DIAGNOSIS — L89323 Pressure ulcer of left buttock, stage 3: Secondary | ICD-10-CM | POA: Diagnosis not present

## 2019-07-29 DIAGNOSIS — R2689 Other abnormalities of gait and mobility: Secondary | ICD-10-CM | POA: Diagnosis not present

## 2019-07-29 DIAGNOSIS — I272 Pulmonary hypertension, unspecified: Secondary | ICD-10-CM

## 2019-07-29 DIAGNOSIS — R531 Weakness: Secondary | ICD-10-CM | POA: Diagnosis not present

## 2019-07-29 DIAGNOSIS — E118 Type 2 diabetes mellitus with unspecified complications: Secondary | ICD-10-CM | POA: Diagnosis not present

## 2019-07-29 DIAGNOSIS — E119 Type 2 diabetes mellitus without complications: Secondary | ICD-10-CM | POA: Diagnosis not present

## 2019-07-29 LAB — BASIC METABOLIC PANEL
Anion gap: 12 (ref 5–15)
BUN: 24 mg/dL — ABNORMAL HIGH (ref 8–23)
CO2: 33 mmol/L — ABNORMAL HIGH (ref 22–32)
Calcium: 7.6 mg/dL — ABNORMAL LOW (ref 8.9–10.3)
Chloride: 86 mmol/L — ABNORMAL LOW (ref 98–111)
Creatinine, Ser: 1.03 mg/dL — ABNORMAL HIGH (ref 0.44–1.00)
GFR calc Af Amer: >60 mL/min (ref >60)
GFR calc non Af Amer: 58 mL/min — ABNORMAL LOW (ref >60)
Glucose, Bld: 97 mg/dL (ref 70–99)
Potassium: 4 mmol/L (ref 3.5–5.1)
Sodium: 131 mmol/L — ABNORMAL LOW (ref 135–145)

## 2019-07-29 LAB — GLUCOSE, CAPILLARY
Glucose-Capillary: 94 mg/dL (ref 70–99)
Glucose-Capillary: 137 mg/dL — ABNORMAL HIGH (ref 70–99)

## 2019-07-29 MED ORDER — DOXYCYCLINE HYCLATE 100 MG PO TABS: 100.0000 mg | ORAL_TABLET | Freq: Two times a day (BID) | ORAL | Status: AC

## 2019-07-29 MED ORDER — FUROSEMIDE 20 MG PO TABS: 40.0000 mg | ORAL_TABLET | Freq: Every day | ORAL | Status: DC

## 2019-07-29 MED ORDER — ESOMEPRAZOLE MAGNESIUM 20 MG PO CPDR: 20.0000 mg | DELAYED_RELEASE_CAPSULE | Freq: Two times a day (BID) | ORAL | Status: DC

## 2019-07-29 MED ORDER — PREDNISONE 20 MG PO TABS: 40.0000 mg | ORAL_TABLET | Freq: Every day | ORAL | Status: AC

## 2019-07-29 NOTE — TOC Transition Note (Signed)
Transition of Care [facility]) - CM/SW Discharge Note   Patient Details  Name: Debbie Mullins MRN: [id number] Date of Birth: March 19, 1955  [facility]) CM/SW Contact:  Boneta Lucks, RN Phone Number: 07/29/2019, 2:38 PM   Clinical Narrative:   Patient Medically ready to transport to [place]. Debbie provided room number and  number for report. RN called report, EMS scheduled, clinicals sent in the hub.     Final next level of care: Skilled Nursing Facility Barriers to Discharge: Barriers Resolved   Patient Goals and CMS Choice Patient states their goals for this hospitalization and ongoing recovery are:: rehab and go home CMS Medicare.gov Compare Post Acute Care list provided to:: Patient Choice offered to / list presented to : Patient  Discharge Placement              Patient to be transferred to facility by: EMS Name of family member notified: TOC spoke patient, could not reach family. She has already updated them. Patient and family notified of of transfer: 07/29/19  Discharge Plan and Services In-house Referral: Clinical Social Work   Post Acute Care Choice: [place]                Readmission Risk Interventions Readmission Risk Prevention Plan 07/27/2019  Medication Screening Complete  Transportation Screening Complete  Some recent data might be hidden

## 2019-07-29 NOTE — Care Management Important Message (Signed)
Important Message  Patient Details  Name: Debbie Mullins MRN: [id number] Date of Birth: 03-Apr-1955   Medicare Important Message Given:  Yes     Tommy Medal 07/29/2019, 12:16 PM

## 2019-07-29 NOTE — Discharge Summary (Signed)
Physician Discharge Summary  Debbie Mullins [id number] DOB: 03/15/1955 DOA: 07/25/2019  PCP: Lucianne Lei, MD  Admit date: 07/25/2019 Discharge date: 07/29/2019  Time spent: 35 minutes  Recommendations for Outpatient Follow-up:  1. Repeat basic metabolic panel to follow-up twice noted on flexion in 1 week 2. Reassess blood pressure and adjust antihypertensive treatment as needed 3. Close monitoring of patient's CBGs with further adjustment to hypoglycemic regimen as required.   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  Patient discharged to skilled nursing facility for further care and rehabilitation.  CODE STATUS: Full code.  Diet recommendation: Low calorie, heart healthy and modify carbohydrates diet.  Filed Weights   07/27/19 0500 07/28/19 0522 07/29/19 0549  Weight: 98.1 kg 94.2 kg 94.7 kg    History of present illness:  64 y.o.femalewith medical history significant ofchronic respiratory failure with hypoxia due to COPD, type 2 diabetes mellitus with nephropathy,chronic kidney disease a stage IIIa, gastroesophageal flux disease, hypertension,diastolic heart failure and class II obesity; who presented to the hospital secondary to worsening shortness of breath. Patient expressed symptoms started approximately 4 days prior to admission has steadily continued worsening. She expressed no fever, no chills, no chest pain, no nausea or vomiting. No dysuria, no hematuria, no melena, no hematochezia. Positive complaint for shortness of breath on exertion, limiting her activity  capacity and associated orthopnea. Also has noted increase in her lower extremity swelling and associated weeping process. On arrival to the ED patient found significantly hypoxic andrequiring nonrebreather supplementation. After IV lasix given and bronchodilator management stabilized to use high flow [place].  Of note,patient uses 2.5-3 L nasal cannula supplementation chronically at home. When attempted to place back on thisamount of oxygen her O2 sat dropped into the low 80s.  Hospital Course:  1-acute on chronic respiratory failure with hypoxia ([place]): Due to COPD exacerbation and CHF exacerbation. -Continue telemetry monitoring -discharge on lasix 40mg  daily.  -Continue to follow daily weights and low sodium diet. -Patient expressed breathing is improved and back to baseline -using chronic 3L  at discharge -Complete oral prednisone, nebulizer management and oral doxycycline. -Continue home chronic O2 supplementation.  2-class II obesity -Body mass index is 36.79 kg/m. -Low calorie diet and portion control has been discussed with patient.  3-Type 2 diabetes mellitus with renal complication (HCC) -Resume home oral hypoglycemic agents. -Continue modified carbohydrate diet -Follow CBGs and adjust as needed hypoglycemic regimen. -A1C 6.3  4-chronic kidney disease is stage IIIa -Continue to minimize nephrotoxic agents -Continue to follow renal function trend  -Advised to maintain adequate oral hydration.  5-essential hypertension -Appears to be well controlled and stable currently. -Continue heart healthy diet (specifically low-sodium diet, less than 2.5 g daily). -Follow vital signs and further adjust antihypertensive regimen as required.Marland Kitchen  6-GERD -Continue PPI. -Dose adjusted to PAT for better symptom management.  7-physical deconditioning -Appreciate evaluation by PT -Recommendations given for skilled nursing facility at discharge for further care and  rehabilitation. -Patient in agreement; will discharge to [facility] for further rehabilitation and care.  Procedures: See below for x-ray reports 2-D echo  1. Left  ventricular ejection fraction, by estimation, is 60 to 65%. The  left ventricle has normal function. The left ventricle has no regional  wall motion abnormalities. Left ventricular diastolic parameters are  consistent with Grade I diastolic  dysfunction (impaired relaxation). There is the interventricular septum is  flattened in systole and diastole, consistent with right ventricular  pressure and volume overload.  2. Right ventricular systolic function is severely reduced. The right  ventricular size is severely enlarged. There is severely elevated  pulmonary artery systolic pressure. The estimated right ventricular  systolic pressure is 0000000 mmHg.  3. Right atrial size was moderately dilated.  4. The mitral valve is grossly normal. Trivial mitral valve  regurgitation.  5. Tricuspid valve regurgitation is moderate.  6. The aortic valve is tricuspid. Aortic valve regurgitation is not  visualized.  7. The inferior vena cava is dilated in size with <50% respiratory  variability, suggesting right atrial pressure of 15 mmHg.   Consultations:  None   Discharge Exam: Vitals:   07/29/19 0816 07/29/19 0820  BP:    Pulse:    Resp:    Temp:    SpO2: 100% 100%    General: No fever, no chest pain, no nausea, no.  Reports breathing back to baseline now feels well 3 L nasal cannula supplementation PT significant improvement in swelling overall.  Feels ready to move into nursing facility for rehabilitation. Cardiovascular: S1-S2, no rubs, no gallops, no JVD unexpected Respiratory: Good bilaterally; no crackles, no wheezing, no using accessory muscle. Abdomen: Soft, nontender, distended, + sounds Extremities/skin: Chronic associated modalities on both legs; no drainage.  No petechiae.  No cyanosis or clubbing.  Stage  II left buttocks pressure injury present at time of admission; no signs of superimposed infection appreciated.  Discharge Instructions   Discharge Instructions    (HEART FAILURE PATIENTS) Call MD:  Anytime you have any of the following symptoms: 1) 3 pound weight gain in 24 hours or 5 pounds in 1 week 2) shortness of breath, with or without a dry hacking cough 3) swelling in the hands, feet or stomach 4) if you have to sleep on extra pillows at night in order to breathe.   Complete by: As directed    Diet - low sodium heart healthy   Complete by: As directed    Discharge instructions   Complete by: As directed    The medications are prescribed Physical therapy and rehabilitation as per skilled nursing facility Follow Low-sodium diet (less than 2.5 g daily). Maintain adequate hydration. Check weight on daily basis     Allergies as of 07/29/2019   No Known Allergies     Medication List    STOP taking these medications   amLODipine 5 MG tablet Commonly known as: NORVASC     TAKE these medications   albuterol 108 (90 Base) MCG/ACT inhaler Commonly known as: VENTOLIN HFA Inhale 1-2 puffs into the lungs every 4 (four) hours as needed for wheezing or shortness of breath.   Anoro Ellipta 62.5-25 MCG/INH Aepb Generic drug: umeclidinium-vilanterol Inhale 1 puff into the lungs daily.   aspirin EC 325 MG tablet Take 325 mg by mouth every morning.   celecoxib 200 MG capsule Commonly known as: CELEBREX Take 200 mg by mouth every morning.   Clobetasol Prop Emollient Base 0.05 % emollient cream Apply 1 application topically 2 (two) times daily as needed (itching).   doxycycline 100 MG tablet Commonly known as: VIBRA-TABS Take 1 tablet (100 mg total) by mouth every  12 (twelve) hours for 2 days.   esomeprazole 20 MG capsule Commonly known as: NEXIUM Take 1 capsule (20 mg total) by mouth 2 (two) times daily before a meal. What changed: when to take this   furosemide 20 MG  tablet Commonly known as: LASIX Take 2 tablets (40 mg total) by mouth daily. What changed:   how much to take  when to take this   metFORMIN 500 MG tablet Commonly known as: GLUCOPHAGE Take 1.5 tablets (750 mg total) by mouth 2 (two) times daily with a meal.   montelukast 10 MG tablet Commonly known as: SINGULAIR Take 10 mg by mouth at bedtime.   predniSONE 20 MG tablet Commonly known as: DELTASONE Take 2 tablets (40 mg total) by mouth daily with breakfast for 2 days. Start taking on: Jul 30, 2019   Zorvolex 35 MG Caps Generic drug: Diclofenac Take 1 capsule by mouth 3 (three) times daily.      No Known Allergies  Contact information for follow-up providers    Lucianne Lei, MD. Schedule an appointment as soon as possible for a visit in 2 week(s).   Specialty: Family Medicine Contact information: [address] [address] [place] [address] [phone]            Contact information for after-discharge care    [facility] .   Service: Skilled Nursing Contact information: [address] [place] [phone]                  The results of significant diagnostics from this hospitalization (including imaging, microbiology, ancillary and laboratory) are listed below for reference.    Significant Diagnostic Studies: DG Chest 2 View  Result Date: 07/25/2019 CLINICAL DATA:  Shortness of breath EXAM: CHEST - 2 VIEW COMPARISON:  01/20/2019 FINDINGS: Heart size appears mildly enlarged, unchanged. Bilateral interstitial prominence without focal airspace consolidation. Low lung volumes. No large pleural fluid collection. No pneumothorax. Advanced degenerative changes of the bilateral shoulders. IMPRESSION: Bilateral interstitial prominence without focal airspace consolidation. Findings are likely accentuated by low lung volumes, however may reflect mild edema versus atypical/viral infection in the  appropriate clinical setting. Electronically Signed   By: Davina Poke D.O.   On: 07/25/2019 11:38   CT Angio Chest PE W/Cm &/Or Wo Cm  Result Date: 07/25/2019 CLINICAL DATA:  Shortness of breath and hypoxia, not taking Lasix EXAM: CT ANGIOGRAPHY CHEST WITH CONTRAST TECHNIQUE: Multidetector CT imaging of the chest was performed using the standard protocol during bolus administration of intravenous contrast. Multiplanar CT image reconstructions and MIPs were obtained to evaluate the vascular anatomy. CONTRAST:  181mL OMNIPAQUE IOHEXOL 350 MG/ML SOLN COMPARISON:  Radiograph 07/25/2019, CT 01/21/2019, CTa 12/15/2018 FINDINGS: Cardiovascular: Satisfactory opacification the pulmonary arteries to the segmental level. No pulmonary artery filling defects are identified. Central pulmonary arterial enlargement is similar to comparison studies and likely reflect some chronic pulmonary artery hypertension. Reflux of contrast into the IVC and hepatic veins is noted. Cardiac size is borderline enlarged. Three-vessel coronary artery disease is noted. No pericardial effusion. Atherosclerotic plaque within the normal caliber aorta. Additional calcifications seen in the normally branching great vessels. Slight tortuosity of the right brachiocephalic vasculature is noted. Mediastinum/Nodes: Mediastinal edematous changes. No mediastinal fluid or gas. Normal thyroid gland and thoracic inlet. No acute abnormality of the esophagus. Few secretions noted in the otherwise unremarkable trachea. No worrisome mediastinal, hilar or axillary adenopathy. Lungs/Pleura: Redemonstration of the mid to upper lung  mosaic attenuation likely accentuated by low lung volumes secondary to imaging during exhalation. Some mild interlobular septal thickening and developing ground-glass opacity throughout the lungs is noted with extensive basilar volume loss and some areas of more dense peribronchovascular opacity in the lung bases. Trace effusions are  present. No pneumothorax. Upper Abdomen: Edematous mesenteric changes in the upper abdomen. No other acute abnormality. Musculoskeletal: Diffuse circumferential body wall edema The osseous structures appear diffusely demineralized which may limit detection of small or nondisplaced fractures. No acute osseous abnormality or suspicious osseous lesion. Prior cervical spine fusion. Multilevel degenerative changes are present in the imaged portions of the spine and shoulders. Review of the MIP images confirms the above findings. IMPRESSION: 1. No acute pulmonary artery filling defects to suggest acute pulmonary embolism. 2. Central pulmonary arterial enlargement is similar to comparison studies and likely reflect some chronic pulmonary artery hypertension. 3. Reflux of contrast into the IVC and hepatic veins may suggest elevated right heart pressures/right heart failure. 4. Diffuse body wall edema, mesenteric edema and trace effusions further support a diagnosis of heart failure/volume overload. 5. Mosaic attenuation of the lungs is a more chronic finding likely accentuated by low lung volumes. Prior HRCT speculated potential chronic hypersensitivity pneumonitis or sarcoid. Correlate with history. 6. More dense peribronchovascular opacity in the lung bases may reflect atelectasis though superimposed infection is not fully excluded. 7. Aortic Atherosclerosis (ICD10-I70.0). Electronically Signed   By: Lovena Le M.D.   On: 07/25/2019 15:37   ECHOCARDIOGRAM COMPLETE  Result Date: 07/26/2019    ECHOCARDIOGRAM REPORT   Patient Name:   GRIZELDA AMPARANO Date of Exam: 07/26/2019 Medical Rec #:  [id number]          Height:       63.0 in Accession #:    [id number]         Weight:       224.6 lb Date of Birth:  1956/02/13          BSA:          2.032 m Patient Age:    36 years           BP:           131/70 mmHg Patient Gender: F                  HR:           76 bpm. Exam Location:  [facility] Procedure: 2D Echo, Cardiac  Doppler and Color Doppler Indications:    Congestive Heart Failure 428.0 / I50.9  History:        Patient has prior history of Echocardiogram examinations, most                 recent 03/04/2012. COPD; Risk Factors:Hypertension, Diabetes and                 Dyslipidemia. Tobacco abuse.  Sonographer:    Alvino Chapel RCS Referring Phys: [place]  1. Left ventricular ejection fraction, by estimation, is 60 to 65%. The left ventricle has normal function. The left ventricle has no regional wall motion abnormalities. Left ventricular diastolic parameters are consistent with Grade I diastolic dysfunction (impaired relaxation). There is the interventricular septum is flattened in systole and diastole, consistent with right ventricular pressure and volume overload.  2. Right ventricular systolic function is severely reduced. The right ventricular size is severely enlarged. There is severely elevated pulmonary artery systolic pressure. The estimated right ventricular systolic pressure  is 60.2 mmHg.  3. Right atrial size was moderately dilated.  4. The mitral valve is grossly normal. Trivial mitral valve regurgitation.  5. Tricuspid valve regurgitation is moderate.  6. The aortic valve is tricuspid. Aortic valve regurgitation is not visualized.  7. The inferior vena cava is dilated in size with <50% respiratory variability, suggesting right atrial pressure of 15 mmHg. FINDINGS  Left Ventricle: Left ventricular ejection fraction, by estimation, is 60 to 65%. The left ventricle has normal function. The left ventricle has no regional wall motion abnormalities. The left ventricular internal cavity size was normal in size. There is  no left ventricular hypertrophy. The interventricular septum is flattened in systole and diastole, consistent with right ventricular pressure and volume overload. Left ventricular diastolic parameters are consistent with Grade I diastolic dysfunction (impaired relaxation). Right  Ventricle: The right ventricular size is severely enlarged. No increase in right ventricular wall thickness. Right ventricular systolic function is severely reduced. There is severely elevated pulmonary artery systolic pressure. The tricuspid regurgitant velocity is 3.36 m/s, and with an assumed right atrial pressure of 15 mmHg, the estimated right ventricular systolic pressure is 0000000 mmHg. Left Atrium: Left atrial size was normal in size. Right Atrium: Right atrial size was moderately dilated. Pericardium: There is no evidence of pericardial effusion. Mitral Valve: The mitral valve is grossly normal. Trivial mitral valve regurgitation. Tricuspid Valve: The tricuspid valve is grossly normal. Tricuspid valve regurgitation is moderate. Aortic Valve: The aortic valve is tricuspid. Aortic valve regurgitation is not visualized. Mild aortic valve annular calcification. Pulmonic Valve: The pulmonic valve was grossly normal. Pulmonic valve regurgitation is trivial. Aorta: The aortic root is normal in size and structure. Venous: The inferior vena cava is dilated in size with less than 50% respiratory variability, suggesting right atrial pressure of 15 mmHg. IAS/Shunts: No atrial level shunt detected by color flow Doppler.  LEFT VENTRICLE PLAX 2D LVIDd:         3.74 cm  Diastology LVIDs:         2.43 cm  LV e' lateral:   10.90 cm/s LV PW:         1.00 cm  LV E/e' lateral: 6.7 LV IVS:        0.90 cm  LV e' medial:    4.35 cm/s LVOT diam:     1.70 cm  LV E/e' medial:  16.7 LV SV:         47 LV SV Index:   23 LVOT Area:     2.27 cm  RIGHT VENTRICLE RV S prime:     6.85 cm/s TAPSE (M-mode): 0.9 cm LEFT ATRIUM             Index       RIGHT ATRIUM           Index LA diam:        4.30 cm 2.12 cm/m  RA Area:     19.80 cm LA Vol (A2C):   47.8 ml 23.53 ml/m RA Volume:   66.90 ml  32.93 ml/m LA Vol (A4C):   36.3 ml 17.87 ml/m LA Biplane Vol: 42.9 ml 21.12 ml/m  AORTIC VALVE LVOT Vmax:   96.60 cm/s LVOT Vmean:  66.200 cm/s LVOT  VTI:    0.205 m  AORTA Ao Root diam: 2.80 cm MITRAL VALVE                TRICUSPID VALVE MV Area (PHT): 3.17 cm     TR Peak grad:  45.2 mmHg MV Decel Time: 239 msec     TR Vmax:        336.00 cm/s MV E velocity: 72.80 cm/s MV A velocity: 113.00 cm/s  SHUNTS MV E/A ratio:  0.64         Systemic VTI:  0.20 m                             Systemic Diam: 1.70 cm Rozann Lesches MD Electronically signed by Rozann Lesches MD Signature Date/Time: 07/26/2019/11:16:38 AM    Final     Microbiology: Recent Results (from the past 240 hour(s))  SARS Coronavirus 2 by RT PCR (hospital order, performed in [facility] hospital lab) Nasopharyngeal Nasopharyngeal Swab     Status: None   Collection Time: 07/25/19 12:05 PM   Specimen: Nasopharyngeal Swab  Result Value Ref Range Status   SARS Coronavirus 2 NEGATIVE NEGATIVE Final    Comment: (NOTE) SARS-CoV-2 target nucleic acids are NOT DETECTED. The SARS-CoV-2 RNA is generally detectable in upper and lower respiratory specimens during the acute phase of infection. The lowest concentration of SARS-CoV-2 viral copies this assay can detect is 250 copies / mL. A negative result does not preclude SARS-CoV-2 infection and should not be used as the sole basis for treatment or other patient management decisions.  A negative result may occur with improper specimen collection / handling, submission of specimen other than nasopharyngeal swab, presence of viral mutation(s) within the areas targeted by this assay, and inadequate number of viral copies (<250 copies / mL). A negative result must be combined with clinical observations, patient history, and epidemiological information. Fact Sheet for Patients:   StrictlyIdeas.no Fact Sheet for Healthcare Providers: BankingDealers.co.za This test is not yet approved or cleared  by the [place] FDA and has been authorized for detection and/or diagnosis of SARS-CoV-2 by FDA  under an Emergency Use Authorization (EUA).  This EUA will remain in effect (meaning this test can be used) for the duration of the COVID-19 declaration under Section 564(b)(1) of the Act, 21 U.S.C. section 360bbb-3(b)(1), unless the authorization is terminated or revoked sooner. Performed at [facility], [address] [place], [place] [address]      Labs: Basic Metabolic Panel: Recent Labs  Lab 07/25/19 1208 07/26/19 0545 07/27/19 0508 07/28/19 0454 07/29/19 0534  NA 132* 129* 131* 131* 131*  K 4.9 4.5 4.5 4.0 4.0  CL 93* 92* 90* 89* 86*  CO2 26 28 30  32 33*  GLUCOSE 97 101* 120* 119* 97  BUN 24* 23 22 23  24*  CREATININE 1.17* 1.18* 1.16* 1.06* 1.03*  CALCIUM 8.3* 8.0* 8.1* 7.7* 7.6*  MG 1.3*  --   --   --   --    Liver Function Tests: Recent Labs  Lab 07/25/19 1208  AST 15  ALT 16  ALKPHOS 187*  BILITOT 0.9  PROT 7.8  ALBUMIN 3.5   CBC: Recent Labs  Lab 07/25/19 1208  WBC 6.3  NEUTROABS 5.0  HGB 10.8*  HCT 36.8  MCV 84.6  PLT 274   BNP (last 3 results) Recent Labs    12/15/18 1453 01/20/19 1506 07/25/19 1208  BNP 75.1 63.0 805.0*    CBG: Recent Labs  Lab 07/28/19 1123 07/28/19 1706 07/28/19 2047 07/29/19 0751 07/29/19 1129  GLUCAP 194* 173* 145* 94 137*    Signed:  Barton Dubois MD.  [facility] 07/29/2019, 12:51 PM

## 2019-08-03 DIAGNOSIS — I509 Heart failure, unspecified: Secondary | ICD-10-CM | POA: Diagnosis not present

## 2019-08-03 DIAGNOSIS — J449 Chronic obstructive pulmonary disease, unspecified: Secondary | ICD-10-CM | POA: Diagnosis not present

## 2019-08-03 DIAGNOSIS — I10 Essential (primary) hypertension: Secondary | ICD-10-CM | POA: Diagnosis not present

## 2019-08-04 ENCOUNTER — Other Ambulatory Visit: Payer: Self-pay | Admitting: Pulmonary Disease

## 2019-08-04 DIAGNOSIS — J449 Chronic obstructive pulmonary disease, unspecified: Secondary | ICD-10-CM | POA: Diagnosis not present

## 2019-08-04 DIAGNOSIS — I10 Essential (primary) hypertension: Secondary | ICD-10-CM | POA: Diagnosis not present

## 2019-08-04 MED ORDER — ANORO ELLIPTA 62.5-25 MCG/INH IN AEPB: INHALATION_SPRAY | RESPIRATORY_TRACT | 3 refills | Status: DC

## 2019-08-05 DIAGNOSIS — L89323 Pressure ulcer of left buttock, stage 3: Secondary | ICD-10-CM | POA: Diagnosis not present

## 2019-08-05 DIAGNOSIS — M6281 Muscle weakness (generalized): Secondary | ICD-10-CM | POA: Diagnosis not present

## 2019-08-12 DIAGNOSIS — L89323 Pressure ulcer of left buttock, stage 3: Secondary | ICD-10-CM | POA: Diagnosis not present

## 2019-08-12 DIAGNOSIS — M6281 Muscle weakness (generalized): Secondary | ICD-10-CM | POA: Diagnosis not present

## 2019-08-19 DIAGNOSIS — R092 Respiratory arrest: Secondary | ICD-10-CM | POA: Diagnosis not present

## 2019-08-19 DIAGNOSIS — I110 Hypertensive heart disease with heart failure: Secondary | ICD-10-CM | POA: Diagnosis not present

## 2019-08-19 DIAGNOSIS — I10 Essential (primary) hypertension: Secondary | ICD-10-CM | POA: Diagnosis not present

## 2019-08-19 DIAGNOSIS — J441 Chronic obstructive pulmonary disease with (acute) exacerbation: Secondary | ICD-10-CM | POA: Diagnosis not present

## 2019-08-20 DIAGNOSIS — E1122 Type 2 diabetes mellitus with diabetic chronic kidney disease: Secondary | ICD-10-CM | POA: Diagnosis not present

## 2019-08-20 DIAGNOSIS — J449 Chronic obstructive pulmonary disease, unspecified: Secondary | ICD-10-CM | POA: Diagnosis not present

## 2019-08-20 DIAGNOSIS — I5030 Unspecified diastolic (congestive) heart failure: Secondary | ICD-10-CM | POA: Diagnosis not present

## 2019-08-20 DIAGNOSIS — J9611 Chronic respiratory failure with hypoxia: Secondary | ICD-10-CM | POA: Diagnosis not present

## 2019-08-20 DIAGNOSIS — K219 Gastro-esophageal reflux disease without esophagitis: Secondary | ICD-10-CM | POA: Diagnosis not present

## 2019-08-20 DIAGNOSIS — E1140 Type 2 diabetes mellitus with diabetic neuropathy, unspecified: Secondary | ICD-10-CM | POA: Diagnosis not present

## 2019-08-20 DIAGNOSIS — N183 Chronic kidney disease, stage 3 unspecified: Secondary | ICD-10-CM | POA: Diagnosis not present

## 2019-08-20 DIAGNOSIS — I130 Hypertensive heart and chronic kidney disease with heart failure and stage 1 through stage 4 chronic kidney disease, or unspecified chronic kidney disease: Secondary | ICD-10-CM | POA: Diagnosis not present

## 2019-08-24 DIAGNOSIS — W19XXXA Unspecified fall, initial encounter: Secondary | ICD-10-CM | POA: Diagnosis not present

## 2019-08-24 DIAGNOSIS — R69 Illness, unspecified: Secondary | ICD-10-CM | POA: Diagnosis not present

## 2019-08-25 DIAGNOSIS — J449 Chronic obstructive pulmonary disease, unspecified: Secondary | ICD-10-CM | POA: Diagnosis not present

## 2019-08-25 DIAGNOSIS — J9611 Chronic respiratory failure with hypoxia: Secondary | ICD-10-CM | POA: Diagnosis not present

## 2019-08-25 DIAGNOSIS — I130 Hypertensive heart and chronic kidney disease with heart failure and stage 1 through stage 4 chronic kidney disease, or unspecified chronic kidney disease: Secondary | ICD-10-CM | POA: Diagnosis not present

## 2019-08-25 DIAGNOSIS — K219 Gastro-esophageal reflux disease without esophagitis: Secondary | ICD-10-CM | POA: Diagnosis not present

## 2019-08-25 DIAGNOSIS — N183 Chronic kidney disease, stage 3 unspecified: Secondary | ICD-10-CM | POA: Diagnosis not present

## 2019-08-25 DIAGNOSIS — I5030 Unspecified diastolic (congestive) heart failure: Secondary | ICD-10-CM | POA: Diagnosis not present

## 2019-08-25 DIAGNOSIS — E1122 Type 2 diabetes mellitus with diabetic chronic kidney disease: Secondary | ICD-10-CM | POA: Diagnosis not present

## 2019-08-25 DIAGNOSIS — E1140 Type 2 diabetes mellitus with diabetic neuropathy, unspecified: Secondary | ICD-10-CM | POA: Diagnosis not present

## 2019-08-26 DIAGNOSIS — J449 Chronic obstructive pulmonary disease, unspecified: Secondary | ICD-10-CM | POA: Diagnosis not present

## 2019-08-27 DIAGNOSIS — I130 Hypertensive heart and chronic kidney disease with heart failure and stage 1 through stage 4 chronic kidney disease, or unspecified chronic kidney disease: Secondary | ICD-10-CM | POA: Diagnosis not present

## 2019-08-27 DIAGNOSIS — J9611 Chronic respiratory failure with hypoxia: Secondary | ICD-10-CM | POA: Diagnosis not present

## 2019-08-27 DIAGNOSIS — N183 Chronic kidney disease, stage 3 unspecified: Secondary | ICD-10-CM | POA: Diagnosis not present

## 2019-08-27 DIAGNOSIS — J449 Chronic obstructive pulmonary disease, unspecified: Secondary | ICD-10-CM | POA: Diagnosis not present

## 2019-08-27 DIAGNOSIS — E1122 Type 2 diabetes mellitus with diabetic chronic kidney disease: Secondary | ICD-10-CM | POA: Diagnosis not present

## 2019-08-27 DIAGNOSIS — E1140 Type 2 diabetes mellitus with diabetic neuropathy, unspecified: Secondary | ICD-10-CM | POA: Diagnosis not present

## 2019-08-27 DIAGNOSIS — I5030 Unspecified diastolic (congestive) heart failure: Secondary | ICD-10-CM | POA: Diagnosis not present

## 2019-08-27 DIAGNOSIS — K219 Gastro-esophageal reflux disease without esophagitis: Secondary | ICD-10-CM | POA: Diagnosis not present

## 2019-08-31 ENCOUNTER — Telehealth: Payer: Self-pay | Admitting: Pulmonary Disease

## 2019-08-31 DIAGNOSIS — J9611 Chronic respiratory failure with hypoxia: Secondary | ICD-10-CM | POA: Diagnosis not present

## 2019-08-31 DIAGNOSIS — I130 Hypertensive heart and chronic kidney disease with heart failure and stage 1 through stage 4 chronic kidney disease, or unspecified chronic kidney disease: Secondary | ICD-10-CM | POA: Diagnosis not present

## 2019-08-31 DIAGNOSIS — E1140 Type 2 diabetes mellitus with diabetic neuropathy, unspecified: Secondary | ICD-10-CM | POA: Diagnosis not present

## 2019-08-31 DIAGNOSIS — E1122 Type 2 diabetes mellitus with diabetic chronic kidney disease: Secondary | ICD-10-CM | POA: Diagnosis not present

## 2019-08-31 DIAGNOSIS — J449 Chronic obstructive pulmonary disease, unspecified: Secondary | ICD-10-CM | POA: Diagnosis not present

## 2019-08-31 DIAGNOSIS — N183 Chronic kidney disease, stage 3 unspecified: Secondary | ICD-10-CM | POA: Diagnosis not present

## 2019-08-31 DIAGNOSIS — I5030 Unspecified diastolic (congestive) heart failure: Secondary | ICD-10-CM | POA: Diagnosis not present

## 2019-08-31 DIAGNOSIS — K219 Gastro-esophageal reflux disease without esophagitis: Secondary | ICD-10-CM | POA: Diagnosis not present

## 2019-08-31 NOTE — Telephone Encounter (Signed)
OV notes have been faxed to pt's PCP office. Nothing further was needed.

## 2019-09-02 DIAGNOSIS — I5030 Unspecified diastolic (congestive) heart failure: Secondary | ICD-10-CM | POA: Diagnosis not present

## 2019-09-02 DIAGNOSIS — K219 Gastro-esophageal reflux disease without esophagitis: Secondary | ICD-10-CM | POA: Diagnosis not present

## 2019-09-02 DIAGNOSIS — E1140 Type 2 diabetes mellitus with diabetic neuropathy, unspecified: Secondary | ICD-10-CM | POA: Diagnosis not present

## 2019-09-02 DIAGNOSIS — N183 Chronic kidney disease, stage 3 unspecified: Secondary | ICD-10-CM | POA: Diagnosis not present

## 2019-09-02 DIAGNOSIS — I130 Hypertensive heart and chronic kidney disease with heart failure and stage 1 through stage 4 chronic kidney disease, or unspecified chronic kidney disease: Secondary | ICD-10-CM | POA: Diagnosis not present

## 2019-09-02 DIAGNOSIS — J449 Chronic obstructive pulmonary disease, unspecified: Secondary | ICD-10-CM | POA: Diagnosis not present

## 2019-09-02 DIAGNOSIS — J9611 Chronic respiratory failure with hypoxia: Secondary | ICD-10-CM | POA: Diagnosis not present

## 2019-09-02 DIAGNOSIS — E1122 Type 2 diabetes mellitus with diabetic chronic kidney disease: Secondary | ICD-10-CM | POA: Diagnosis not present

## 2019-09-08 DIAGNOSIS — J9611 Chronic respiratory failure with hypoxia: Secondary | ICD-10-CM | POA: Diagnosis not present

## 2019-09-08 DIAGNOSIS — E1122 Type 2 diabetes mellitus with diabetic chronic kidney disease: Secondary | ICD-10-CM | POA: Diagnosis not present

## 2019-09-08 DIAGNOSIS — E1140 Type 2 diabetes mellitus with diabetic neuropathy, unspecified: Secondary | ICD-10-CM | POA: Diagnosis not present

## 2019-09-08 DIAGNOSIS — I130 Hypertensive heart and chronic kidney disease with heart failure and stage 1 through stage 4 chronic kidney disease, or unspecified chronic kidney disease: Secondary | ICD-10-CM | POA: Diagnosis not present

## 2019-09-08 DIAGNOSIS — I5030 Unspecified diastolic (congestive) heart failure: Secondary | ICD-10-CM | POA: Diagnosis not present

## 2019-09-08 DIAGNOSIS — J449 Chronic obstructive pulmonary disease, unspecified: Secondary | ICD-10-CM | POA: Diagnosis not present

## 2019-09-08 DIAGNOSIS — K219 Gastro-esophageal reflux disease without esophagitis: Secondary | ICD-10-CM | POA: Diagnosis not present

## 2019-09-08 DIAGNOSIS — N183 Chronic kidney disease, stage 3 unspecified: Secondary | ICD-10-CM | POA: Diagnosis not present

## 2019-09-09 DIAGNOSIS — E119 Type 2 diabetes mellitus without complications: Secondary | ICD-10-CM | POA: Diagnosis not present

## 2019-09-09 DIAGNOSIS — M1250 Traumatic arthropathy, unspecified site: Secondary | ICD-10-CM | POA: Diagnosis not present

## 2019-09-09 DIAGNOSIS — J449 Chronic obstructive pulmonary disease, unspecified: Secondary | ICD-10-CM | POA: Diagnosis not present

## 2019-09-09 DIAGNOSIS — E1169 Type 2 diabetes mellitus with other specified complication: Secondary | ICD-10-CM | POA: Diagnosis not present

## 2019-09-09 DIAGNOSIS — I10 Essential (primary) hypertension: Secondary | ICD-10-CM | POA: Diagnosis not present

## 2019-09-09 DIAGNOSIS — D649 Anemia, unspecified: Secondary | ICD-10-CM | POA: Diagnosis not present

## 2019-09-09 DIAGNOSIS — D644 Congenital dyserythropoietic anemia: Secondary | ICD-10-CM | POA: Diagnosis not present

## 2019-09-10 DIAGNOSIS — J9611 Chronic respiratory failure with hypoxia: Secondary | ICD-10-CM | POA: Diagnosis not present

## 2019-09-10 DIAGNOSIS — E1140 Type 2 diabetes mellitus with diabetic neuropathy, unspecified: Secondary | ICD-10-CM | POA: Diagnosis not present

## 2019-09-10 DIAGNOSIS — I130 Hypertensive heart and chronic kidney disease with heart failure and stage 1 through stage 4 chronic kidney disease, or unspecified chronic kidney disease: Secondary | ICD-10-CM | POA: Diagnosis not present

## 2019-09-10 DIAGNOSIS — N183 Chronic kidney disease, stage 3 unspecified: Secondary | ICD-10-CM | POA: Diagnosis not present

## 2019-09-10 DIAGNOSIS — J449 Chronic obstructive pulmonary disease, unspecified: Secondary | ICD-10-CM | POA: Diagnosis not present

## 2019-09-10 DIAGNOSIS — I5030 Unspecified diastolic (congestive) heart failure: Secondary | ICD-10-CM | POA: Diagnosis not present

## 2019-09-10 DIAGNOSIS — E1122 Type 2 diabetes mellitus with diabetic chronic kidney disease: Secondary | ICD-10-CM | POA: Diagnosis not present

## 2019-09-10 DIAGNOSIS — K219 Gastro-esophageal reflux disease without esophagitis: Secondary | ICD-10-CM | POA: Diagnosis not present

## 2019-09-21 DIAGNOSIS — J449 Chronic obstructive pulmonary disease, unspecified: Secondary | ICD-10-CM | POA: Diagnosis not present

## 2019-09-21 DIAGNOSIS — K219 Gastro-esophageal reflux disease without esophagitis: Secondary | ICD-10-CM | POA: Diagnosis not present

## 2019-09-21 DIAGNOSIS — E1140 Type 2 diabetes mellitus with diabetic neuropathy, unspecified: Secondary | ICD-10-CM | POA: Diagnosis not present

## 2019-09-21 DIAGNOSIS — N183 Chronic kidney disease, stage 3 unspecified: Secondary | ICD-10-CM | POA: Diagnosis not present

## 2019-09-21 DIAGNOSIS — I130 Hypertensive heart and chronic kidney disease with heart failure and stage 1 through stage 4 chronic kidney disease, or unspecified chronic kidney disease: Secondary | ICD-10-CM | POA: Diagnosis not present

## 2019-09-21 DIAGNOSIS — I5030 Unspecified diastolic (congestive) heart failure: Secondary | ICD-10-CM | POA: Diagnosis not present

## 2019-09-21 DIAGNOSIS — E1122 Type 2 diabetes mellitus with diabetic chronic kidney disease: Secondary | ICD-10-CM | POA: Diagnosis not present

## 2019-09-21 DIAGNOSIS — J9611 Chronic respiratory failure with hypoxia: Secondary | ICD-10-CM | POA: Diagnosis not present

## 2019-09-25 DIAGNOSIS — J449 Chronic obstructive pulmonary disease, unspecified: Secondary | ICD-10-CM | POA: Diagnosis not present

## 2019-10-09 DIAGNOSIS — R609 Edema, unspecified: Secondary | ICD-10-CM | POA: Diagnosis not present

## 2019-10-09 DIAGNOSIS — I110 Hypertensive heart disease with heart failure: Secondary | ICD-10-CM | POA: Diagnosis not present

## 2019-10-09 DIAGNOSIS — I10 Essential (primary) hypertension: Secondary | ICD-10-CM | POA: Diagnosis not present

## 2019-10-09 DIAGNOSIS — R0902 Hypoxemia: Secondary | ICD-10-CM | POA: Diagnosis not present

## 2019-10-09 DIAGNOSIS — J441 Chronic obstructive pulmonary disease with (acute) exacerbation: Secondary | ICD-10-CM | POA: Diagnosis not present

## 2019-10-16 DIAGNOSIS — K219 Gastro-esophageal reflux disease without esophagitis: Secondary | ICD-10-CM | POA: Diagnosis not present

## 2019-10-16 DIAGNOSIS — E1122 Type 2 diabetes mellitus with diabetic chronic kidney disease: Secondary | ICD-10-CM | POA: Diagnosis not present

## 2019-10-16 DIAGNOSIS — I130 Hypertensive heart and chronic kidney disease with heart failure and stage 1 through stage 4 chronic kidney disease, or unspecified chronic kidney disease: Secondary | ICD-10-CM | POA: Diagnosis not present

## 2019-10-16 DIAGNOSIS — J9611 Chronic respiratory failure with hypoxia: Secondary | ICD-10-CM | POA: Diagnosis not present

## 2019-10-16 DIAGNOSIS — I5030 Unspecified diastolic (congestive) heart failure: Secondary | ICD-10-CM | POA: Diagnosis not present

## 2019-10-16 DIAGNOSIS — N183 Chronic kidney disease, stage 3 unspecified: Secondary | ICD-10-CM | POA: Diagnosis not present

## 2019-10-16 DIAGNOSIS — J449 Chronic obstructive pulmonary disease, unspecified: Secondary | ICD-10-CM | POA: Diagnosis not present

## 2019-10-16 DIAGNOSIS — E1140 Type 2 diabetes mellitus with diabetic neuropathy, unspecified: Secondary | ICD-10-CM | POA: Diagnosis not present

## 2019-10-19 DIAGNOSIS — Z9981 Dependence on supplemental oxygen: Secondary | ICD-10-CM | POA: Diagnosis not present

## 2019-10-19 DIAGNOSIS — J449 Chronic obstructive pulmonary disease, unspecified: Secondary | ICD-10-CM | POA: Diagnosis not present

## 2019-10-19 DIAGNOSIS — I5030 Unspecified diastolic (congestive) heart failure: Secondary | ICD-10-CM | POA: Diagnosis not present

## 2019-10-19 DIAGNOSIS — I130 Hypertensive heart and chronic kidney disease with heart failure and stage 1 through stage 4 chronic kidney disease, or unspecified chronic kidney disease: Secondary | ICD-10-CM | POA: Diagnosis not present

## 2019-10-19 DIAGNOSIS — J9611 Chronic respiratory failure with hypoxia: Secondary | ICD-10-CM | POA: Diagnosis not present

## 2019-10-19 DIAGNOSIS — K219 Gastro-esophageal reflux disease without esophagitis: Secondary | ICD-10-CM | POA: Diagnosis not present

## 2019-10-19 DIAGNOSIS — N183 Chronic kidney disease, stage 3 unspecified: Secondary | ICD-10-CM | POA: Diagnosis not present

## 2019-10-19 DIAGNOSIS — E1140 Type 2 diabetes mellitus with diabetic neuropathy, unspecified: Secondary | ICD-10-CM | POA: Diagnosis not present

## 2019-10-19 DIAGNOSIS — E1122 Type 2 diabetes mellitus with diabetic chronic kidney disease: Secondary | ICD-10-CM | POA: Diagnosis not present

## 2019-10-24 ENCOUNTER — Other Ambulatory Visit: Payer: Self-pay

## 2019-10-24 ENCOUNTER — Ambulatory Visit
Admission: EM | Admit: 2019-10-24 | Discharge: 2019-10-24 | Disposition: A | Discharge status: Home / Self Care | Payer: Medicare Other | Attending: Emergency Medicine | Admitting: Emergency Medicine

## 2019-10-24 ENCOUNTER — Encounter: Payer: Self-pay | Admitting: Emergency Medicine

## 2019-10-24 DIAGNOSIS — R1030 Lower abdominal pain, unspecified: Secondary | ICD-10-CM | POA: Insufficient documentation

## 2019-10-24 LAB — POCT URINALYSIS DIP (MANUAL ENTRY)
Bilirubin, UA: NEGATIVE
Blood, UA: NEGATIVE
Glucose, UA: NEGATIVE mg/dL
Ketones, POC UA: NEGATIVE mg/dL
Leukocytes, UA: NEGATIVE
Nitrite, UA: NEGATIVE
Protein Ur, POC: NEGATIVE mg/dL
Spec Grav, UA: 1.015 (ref 1.010–1.025)
Urobilinogen, UA: 0.2 E.U./dL
pH, UA: 5.5 (ref 5.0–8.0)

## 2019-10-24 MED ORDER — ALUM & MAG HYDROXIDE-SIMETH 400-400-40 MG/5ML PO SUSP: 5.0000 mL | Freq: Four times a day (QID) | ORAL | 0 refills | Status: DC | PRN

## 2019-10-24 MED ORDER — ALUM & MAG HYDROXIDE-SIMETH 200-200-20 MG/5ML PO SUSP
30.0000 mL | Freq: Once | ORAL | Status: AC
  Administered 2019-10-24: 30 mL via ORAL

## 2019-10-24 MED ORDER — LIDOCAINE VISCOUS HCL 2 % MT SOLN
15.0000 mL | Freq: Once | OROMUCOSAL | Status: AC
  Administered 2019-10-24: 15 mL via ORAL

## 2019-10-24 NOTE — ED Provider Notes (Addendum)
[place]   [id number] 10/24/19 Arrival Time: 97  CC: ABDOMINAL DISCOMFORT  SUBJECTIVE:  Debbie Mullins is a 64 y.o. female presented to the urgent care with a complaint of lower abdominal pain loss of appetite for the past 2 days.  Denies any precipitating event.  Described the pain as burning sensation.  She has been taking Nexium 20 mg twice daily with no improvement.  Denies alleviating or aggravating factors.  Denies similar symptoms in the past.  Denies chills, fever, nausea, vomiting, diarrhea.  Patient's last menstrual period was 07/03/2011.  ROS: As per HPI.  All other pertinent ROS negative.     Past Medical History:  Diagnosis Date  . Anemia   . Asthma   . CHF (congestive heart failure) (HCC)    takes Furosemide daily   . COPD (chronic obstructive pulmonary disease) (HCC)    Albuterol inhaler prn;SIngulair at night  . Depression    takes Wellbutrin daily  . Diabetes mellitus    takes Metformin daily  . GERD (gastroesophageal reflux disease)    takes Nexium daily  . History of colon polyps    benign  . Hypertension    takes Lisinopril daily  . Iron deficiency anemia 08/01/2017  . Joint pain   . Joint swelling   . Leukocytosis 02/09/2011  . Neck pain    HNP  . Normocytic anemia 02/09/2011  . Peripheral edema    takes Furosemide daily  . Pneumonia    many yrs ago  . Pulmonary nodules/lesions, multiple 08/09/2013  . Shortness of breath dyspnea    with exertion  . Thrombocytosis ([place]) 02/09/2011  . Urinary frequency   . Urinary urgency   . Weakness    numbness and tingling in both hands   Past Surgical History:  Procedure Laterality Date  . ANTERIOR CERVICAL DECOMP/DISCECTOMY FUSION N/A 11/17/2014   Procedure: Cervical four- cervical five Anterior Cervical Decompression with fusion and bonegraft;  Surgeon: Ashok Pall, MD;  Location: [facility];  Service: Neurosurgery;  Laterality: N/A;  C45 anterior cervical decompression with fusion  plating and bonegraft  . BIOPSY  02/26/2018   Procedure: BIOPSY;  Surgeon: Danie Binder, MD;  [facility]: [facility];  Service: Endoscopy;;  (colon) Gastric  duodenal  . COLONOSCOPY    . COLONOSCOPY N/A 02/26/2018   Procedure: COLONOSCOPY;  Surgeon: Danie Binder, MD;  [facility]: [facility];  Service: Endoscopy;  Laterality: N/A;  9:30am  . ESOPHAGOGASTRODUODENOSCOPY N/A 02/26/2018   Procedure: ESOPHAGOGASTRODUODENOSCOPY (EGD);  Surgeon: Danie Binder, MD;  [facility];  Service: Endoscopy;  Laterality: N/A;  . FEMUR IM NAIL Right 05/23/2016  . FEMUR IM NAIL Right 05/23/2016   Procedure: INTRAMEDULLARY (IM) RETROGRADE FEMORAL NAILING;  Surgeon: [name], MD;  Location: [facility];  Service: Orthopedics;  Laterality: Right;  . KNEE ARTHROPLASTY Left 08/10/2013   Procedure:  TOTAL KNEE ARTHROPLASTY;  Surgeon: [name], MD;  Location: [facility];  Service: Orthopedics;  Laterality: Left;  Left Total Knee Arthroplasty Revision, Cemented, Semi-constrained,   . LEFT AND RIGHT HEART CATHETERIZATION WITH CORONARY ANGIOGRAM N/A 01/07/2012   Procedure: LEFT AND RIGHT HEART CATHETERIZATION WITH CORONARY ANGIOGRAM;  Surgeon: Thayer Headings, MD;  Location: [facility];  Service: Cardiovascular;  Laterality: N/A;  . TONSILLECTOMY     as a child  . TOTAL KNEE ARTHROPLASTY Bilateral    No Known Allergies No current facility-administered medications on file prior to encounter.   Current Outpatient Medications  on File Prior to Encounter  Medication Sig Dispense Refill  . albuterol (VENTOLIN HFA) 108 (90 Base) MCG/ACT inhaler Inhale 1-2 puffs into the lungs every 4 (four) hours as needed for wheezing or shortness of breath. 8 g 1  . aspirin EC 325 MG tablet Take 325 mg by mouth every morning.     . celecoxib (CELEBREX) 200 MG capsule Take 200 mg by mouth every morning.     . Clobetasol Prop Emollient Base 0.05 % emollient cream Apply 1 application topically 2 (two) times daily as needed  (itching).     Marland Kitchen esomeprazole (NEXIUM) 20 MG capsule Take 1 capsule (20 mg total) by mouth 2 (two) times daily before a meal.    . furosemide (LASIX) 20 MG tablet Take 2 tablets (40 mg total) by mouth daily.    . metFORMIN (GLUCOPHAGE) 500 MG tablet Take 1.5 tablets (750 mg total) by mouth 2 (two) times daily with a meal.    . montelukast (SINGULAIR) 10 MG tablet Take 10 mg by mouth at bedtime.    Marland Kitchen umeclidinium-vilanterol (ANORO ELLIPTA) 62.5-25 MCG/INH AEPB USE 1 INHALATION BY MOUTH  DAILY 180 each 3  . ZORVOLEX 35 MG CAPS Take 1 capsule by mouth 3 (three) times daily.     Social History   Socioeconomic History  . Marital status: Single    Spouse name: Not on file  . Number of children: Not on file  . Years of education: Not on file  . Highest education level: Not on file  Occupational History  . Not on file  Tobacco Use  . Smoking status: Current Every Day Smoker    Packs/day: 0.50    Years: 41.00    Pack years: 20.50    Types: Cigarettes  . Smokeless tobacco: Never Used  . Tobacco comment: pt currently trying to quit with gum/losenges  Vaping Use  . Vaping Use: Never used  Substance and Sexual Activity  . Alcohol use: Yes    Comment: wine  . Drug use: No  . Sexual activity: Never  Other Topics Concern  . Not on file  Social History Narrative   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 COUSINS TRANSPORTS HER.   Social Determinants of Health   Financial Resource Strain:   . Difficulty of Paying Living Expenses:   Food Insecurity:   . Worried About Charity fundraiser in the Last Year:   . Arboriculturist in the Last Year:   Transportation Needs:   . Film/video editor (Medical):   Marland Kitchen Lack of Transportation (Non-Medical):   Physical Activity:   . Days of Exercise per Week:   . Minutes of Exercise per Session:   Stress:   . Feeling of Stress :   Social Connections:   . Frequency of Communication with Friends and Family:   . Frequency of  Social Gatherings with Friends and Family:   . Attends Religious Services:   . Active Member of Clubs or Organizations:   . Attends Archivist Meetings:   Marland Kitchen Marital Status:   Intimate Partner Violence:   . Fear of Current or Ex-Partner:   . Emotionally Abused:   Marland Kitchen Physically Abused:   . Sexually Abused:    Family History  Problem Relation Age of Onset  . Heart failure Mother   . Cancer Maternal Grandmother   . Colon cancer Neg Hx   . Colon polyps Neg Hx  OBJECTIVE:  Vitals:   10/24/19 1537 10/24/19 1539  BP:  (!) 140/91  Pulse:  90  Resp:  19  Temp:  98.1 F (36.7 C)  TempSrc:  Oral  SpO2:  98%  Weight: 146 lb 9.6 oz (66.5 kg)   Height: 5\' 3"  (1.6 m)     General appearance: Alert; NAD HEENT: NCAT.  Oropharynx clear.  Lungs: clear to auscultation bilaterally without adventitious breath sounds Heart: regular rate and rhythm.  Radial pulses 2+ symmetrical bilaterally Abdomen: soft, non-distended; normal active bowel sounds; non-tender to light and deep palpation; nontender at McBurney's point; negative Murphy's sign; negative rebound; no guarding Back: no CVA tenderness Extremities: no edema; symmetrical with no gross deformities Skin: warm and dry Neurologic: normal gait Psychological: alert and cooperative; normal mood and affect  LABS: Results for orders placed or performed during the hospital encounter of 10/24/19 (from the past 24 hour(s))  POCT urinalysis dipstick     Status: None   Collection Time: 10/24/19  3:48 PM  Result Value Ref Range   Color, UA yellow yellow   Clarity, UA clear clear   Glucose, UA negative negative mg/dL   Bilirubin, UA negative negative   Ketones, POC UA negative negative mg/dL   Spec Grav, UA 1.015 1.010 - 1.025   Blood, UA negative negative   pH, UA 5.5 5.0 - 8.0   Protein Ur, POC negative negative mg/dL   Urobilinogen, UA 0.2 0.2 or 1.0 E.U./dL   Nitrite, UA Negative Negative   Leukocytes, UA Negative Negative      DIAGNOSTIC STUDIES: No results found.   ASSESSMENT & PLAN:  1. Abdominal pain, lower     Meds ordered this encounter  Medications  . AND Linked Order Group   . alum & mag hydroxide-simeth (MAALOX/MYLANTA) 200-200-20 MG/5ML suspension 30 mL   . lidocaine (XYLOCAINE) 2 % viscous mouth solution 15 mL  . alum & mag hydroxide-simeth (MAALOX PLUS) 400-400-40 MG/5ML suspension    Sig: Take 5 mLs by mouth every 6 (six) hours as needed for indigestion.    Dispense:  355 mL    Refill:  0   Patient stable at discharge.  Her symptoms likely from ulcer.  She was advised to follow-up with PCP to get a referral to see a GI.  GI cocktail was given in office  Discharge instructions GI cocktail was given in office Maalox/Mylanta was prescribed take as directed Follow-up with PCP Return or go to ED for worsening of symptoms If you experience new or worsening symptoms return or go to ER such as fever, chills, nausea, vomiting, diarrhea, bloody or dark tarry stools, constipation, urinary symptoms, worsening abdominal discomfort, symptoms that do not improve with medications, inability to keep fluids down, etc...  Reviewed expectations re: course of current medical issues. Questions answered. Outlined signs and symptoms indicating need for more acute intervention. Patient verbalized understanding. After Visit Summary given.   Note: This document was prepared using Dragon voice recognition software and may include unintentional dictation errors.    Emerson Monte, FNP 10/24/19 1606    Emerson Monte, FNP 10/24/19 1610

## 2019-10-24 NOTE — ED Triage Notes (Signed)
Lower abd pain and burning with urination, also loss of appetite x 2 days

## 2019-10-24 NOTE — Discharge Instructions (Signed)
GI cocktail was given in office Maalox/Mylanta was prescribed take as directed Follow-up with PCP Return or go to ED for worsening of symptoms If you experience new or worsening symptoms return or go to ER such as fever, chills, nausea, vomiting, diarrhea, bloody or dark tarry stools, constipation, urinary symptoms, worsening abdominal discomfort, symptoms that do not improve with medications, inability to keep fluids down, etc..Marland Kitchen

## 2019-10-25 LAB — URINE CULTURE
Culture: <10000 — AB
Special Requests: NORMAL

## 2019-10-26 DIAGNOSIS — J441 Chronic obstructive pulmonary disease with (acute) exacerbation: Secondary | ICD-10-CM | POA: Diagnosis not present

## 2019-10-26 DIAGNOSIS — I10 Essential (primary) hypertension: Secondary | ICD-10-CM | POA: Diagnosis not present

## 2019-10-26 DIAGNOSIS — J449 Chronic obstructive pulmonary disease, unspecified: Secondary | ICD-10-CM | POA: Diagnosis not present

## 2019-10-26 DIAGNOSIS — K2100 Gastro-esophageal reflux disease with esophagitis, without bleeding: Secondary | ICD-10-CM | POA: Diagnosis not present

## 2019-10-26 DIAGNOSIS — Z0000 Encounter for general adult medical examination without abnormal findings: Secondary | ICD-10-CM | POA: Diagnosis not present

## 2019-10-26 DIAGNOSIS — E1169 Type 2 diabetes mellitus with other specified complication: Secondary | ICD-10-CM | POA: Diagnosis not present

## 2019-10-27 DIAGNOSIS — K219 Gastro-esophageal reflux disease without esophagitis: Secondary | ICD-10-CM | POA: Diagnosis not present

## 2019-10-27 DIAGNOSIS — E1122 Type 2 diabetes mellitus with diabetic chronic kidney disease: Secondary | ICD-10-CM | POA: Diagnosis not present

## 2019-10-27 DIAGNOSIS — N183 Chronic kidney disease, stage 3 unspecified: Secondary | ICD-10-CM | POA: Diagnosis not present

## 2019-10-27 DIAGNOSIS — J449 Chronic obstructive pulmonary disease, unspecified: Secondary | ICD-10-CM | POA: Diagnosis not present

## 2019-10-27 DIAGNOSIS — I5030 Unspecified diastolic (congestive) heart failure: Secondary | ICD-10-CM | POA: Diagnosis not present

## 2019-10-27 DIAGNOSIS — J9611 Chronic respiratory failure with hypoxia: Secondary | ICD-10-CM | POA: Diagnosis not present

## 2019-10-27 DIAGNOSIS — E1140 Type 2 diabetes mellitus with diabetic neuropathy, unspecified: Secondary | ICD-10-CM | POA: Diagnosis not present

## 2019-10-27 DIAGNOSIS — I130 Hypertensive heart and chronic kidney disease with heart failure and stage 1 through stage 4 chronic kidney disease, or unspecified chronic kidney disease: Secondary | ICD-10-CM | POA: Diagnosis not present

## 2019-10-27 DIAGNOSIS — Z9981 Dependence on supplemental oxygen: Secondary | ICD-10-CM | POA: Diagnosis not present

## 2019-11-06 DIAGNOSIS — I5030 Unspecified diastolic (congestive) heart failure: Secondary | ICD-10-CM | POA: Diagnosis not present

## 2019-11-06 DIAGNOSIS — Z9981 Dependence on supplemental oxygen: Secondary | ICD-10-CM | POA: Diagnosis not present

## 2019-11-06 DIAGNOSIS — E1122 Type 2 diabetes mellitus with diabetic chronic kidney disease: Secondary | ICD-10-CM | POA: Diagnosis not present

## 2019-11-06 DIAGNOSIS — I130 Hypertensive heart and chronic kidney disease with heart failure and stage 1 through stage 4 chronic kidney disease, or unspecified chronic kidney disease: Secondary | ICD-10-CM | POA: Diagnosis not present

## 2019-11-06 DIAGNOSIS — J9611 Chronic respiratory failure with hypoxia: Secondary | ICD-10-CM | POA: Diagnosis not present

## 2019-11-06 DIAGNOSIS — J449 Chronic obstructive pulmonary disease, unspecified: Secondary | ICD-10-CM | POA: Diagnosis not present

## 2019-11-06 DIAGNOSIS — N183 Chronic kidney disease, stage 3 unspecified: Secondary | ICD-10-CM | POA: Diagnosis not present

## 2019-11-06 DIAGNOSIS — K219 Gastro-esophageal reflux disease without esophagitis: Secondary | ICD-10-CM | POA: Diagnosis not present

## 2019-11-06 DIAGNOSIS — E1140 Type 2 diabetes mellitus with diabetic neuropathy, unspecified: Secondary | ICD-10-CM | POA: Diagnosis not present

## 2019-11-10 DIAGNOSIS — E119 Type 2 diabetes mellitus without complications: Secondary | ICD-10-CM | POA: Diagnosis not present

## 2019-11-10 DIAGNOSIS — J449 Chronic obstructive pulmonary disease, unspecified: Secondary | ICD-10-CM | POA: Diagnosis not present

## 2019-11-10 DIAGNOSIS — I10 Essential (primary) hypertension: Secondary | ICD-10-CM | POA: Diagnosis not present

## 2019-11-10 DIAGNOSIS — M1250 Traumatic arthropathy, unspecified site: Secondary | ICD-10-CM | POA: Diagnosis not present

## 2019-11-12 DIAGNOSIS — E1140 Type 2 diabetes mellitus with diabetic neuropathy, unspecified: Secondary | ICD-10-CM | POA: Diagnosis not present

## 2019-11-12 DIAGNOSIS — I130 Hypertensive heart and chronic kidney disease with heart failure and stage 1 through stage 4 chronic kidney disease, or unspecified chronic kidney disease: Secondary | ICD-10-CM | POA: Diagnosis not present

## 2019-11-12 DIAGNOSIS — J449 Chronic obstructive pulmonary disease, unspecified: Secondary | ICD-10-CM | POA: Diagnosis not present

## 2019-11-12 DIAGNOSIS — N183 Chronic kidney disease, stage 3 unspecified: Secondary | ICD-10-CM | POA: Diagnosis not present

## 2019-11-12 DIAGNOSIS — E1122 Type 2 diabetes mellitus with diabetic chronic kidney disease: Secondary | ICD-10-CM | POA: Diagnosis not present

## 2019-11-12 DIAGNOSIS — Z9981 Dependence on supplemental oxygen: Secondary | ICD-10-CM | POA: Diagnosis not present

## 2019-11-12 DIAGNOSIS — I5030 Unspecified diastolic (congestive) heart failure: Secondary | ICD-10-CM | POA: Diagnosis not present

## 2019-11-12 DIAGNOSIS — K219 Gastro-esophageal reflux disease without esophagitis: Secondary | ICD-10-CM | POA: Diagnosis not present

## 2019-11-12 DIAGNOSIS — J9611 Chronic respiratory failure with hypoxia: Secondary | ICD-10-CM | POA: Diagnosis not present

## 2019-11-19 DIAGNOSIS — E1140 Type 2 diabetes mellitus with diabetic neuropathy, unspecified: Secondary | ICD-10-CM | POA: Diagnosis not present

## 2019-11-19 DIAGNOSIS — Z9981 Dependence on supplemental oxygen: Secondary | ICD-10-CM | POA: Diagnosis not present

## 2019-11-19 DIAGNOSIS — N183 Chronic kidney disease, stage 3 unspecified: Secondary | ICD-10-CM | POA: Diagnosis not present

## 2019-11-19 DIAGNOSIS — J9611 Chronic respiratory failure with hypoxia: Secondary | ICD-10-CM | POA: Diagnosis not present

## 2019-11-19 DIAGNOSIS — K219 Gastro-esophageal reflux disease without esophagitis: Secondary | ICD-10-CM | POA: Diagnosis not present

## 2019-11-19 DIAGNOSIS — I130 Hypertensive heart and chronic kidney disease with heart failure and stage 1 through stage 4 chronic kidney disease, or unspecified chronic kidney disease: Secondary | ICD-10-CM | POA: Diagnosis not present

## 2019-11-19 DIAGNOSIS — E1122 Type 2 diabetes mellitus with diabetic chronic kidney disease: Secondary | ICD-10-CM | POA: Diagnosis not present

## 2019-11-19 DIAGNOSIS — I5030 Unspecified diastolic (congestive) heart failure: Secondary | ICD-10-CM | POA: Diagnosis not present

## 2019-11-19 DIAGNOSIS — J449 Chronic obstructive pulmonary disease, unspecified: Secondary | ICD-10-CM | POA: Diagnosis not present

## 2019-11-25 DIAGNOSIS — I10 Essential (primary) hypertension: Secondary | ICD-10-CM | POA: Diagnosis not present

## 2019-11-25 DIAGNOSIS — J441 Chronic obstructive pulmonary disease with (acute) exacerbation: Secondary | ICD-10-CM | POA: Diagnosis not present

## 2019-11-25 DIAGNOSIS — E1169 Type 2 diabetes mellitus with other specified complication: Secondary | ICD-10-CM | POA: Diagnosis not present

## 2019-11-26 DIAGNOSIS — J449 Chronic obstructive pulmonary disease, unspecified: Secondary | ICD-10-CM | POA: Diagnosis not present

## 2019-11-26 DIAGNOSIS — K219 Gastro-esophageal reflux disease without esophagitis: Secondary | ICD-10-CM | POA: Diagnosis not present

## 2019-11-26 DIAGNOSIS — I130 Hypertensive heart and chronic kidney disease with heart failure and stage 1 through stage 4 chronic kidney disease, or unspecified chronic kidney disease: Secondary | ICD-10-CM | POA: Diagnosis not present

## 2019-11-26 DIAGNOSIS — I5030 Unspecified diastolic (congestive) heart failure: Secondary | ICD-10-CM | POA: Diagnosis not present

## 2019-11-26 DIAGNOSIS — E1122 Type 2 diabetes mellitus with diabetic chronic kidney disease: Secondary | ICD-10-CM | POA: Diagnosis not present

## 2019-11-26 DIAGNOSIS — Z9981 Dependence on supplemental oxygen: Secondary | ICD-10-CM | POA: Diagnosis not present

## 2019-11-26 DIAGNOSIS — N183 Chronic kidney disease, stage 3 unspecified: Secondary | ICD-10-CM | POA: Diagnosis not present

## 2019-11-26 DIAGNOSIS — E1140 Type 2 diabetes mellitus with diabetic neuropathy, unspecified: Secondary | ICD-10-CM | POA: Diagnosis not present

## 2019-11-26 DIAGNOSIS — J9611 Chronic respiratory failure with hypoxia: Secondary | ICD-10-CM | POA: Diagnosis not present

## 2019-12-04 DIAGNOSIS — E1140 Type 2 diabetes mellitus with diabetic neuropathy, unspecified: Secondary | ICD-10-CM | POA: Diagnosis not present

## 2019-12-04 DIAGNOSIS — E1122 Type 2 diabetes mellitus with diabetic chronic kidney disease: Secondary | ICD-10-CM | POA: Diagnosis not present

## 2019-12-04 DIAGNOSIS — K219 Gastro-esophageal reflux disease without esophagitis: Secondary | ICD-10-CM | POA: Diagnosis not present

## 2019-12-04 DIAGNOSIS — J449 Chronic obstructive pulmonary disease, unspecified: Secondary | ICD-10-CM | POA: Diagnosis not present

## 2019-12-04 DIAGNOSIS — I130 Hypertensive heart and chronic kidney disease with heart failure and stage 1 through stage 4 chronic kidney disease, or unspecified chronic kidney disease: Secondary | ICD-10-CM | POA: Diagnosis not present

## 2019-12-04 DIAGNOSIS — I5030 Unspecified diastolic (congestive) heart failure: Secondary | ICD-10-CM | POA: Diagnosis not present

## 2019-12-04 DIAGNOSIS — N183 Chronic kidney disease, stage 3 unspecified: Secondary | ICD-10-CM | POA: Diagnosis not present

## 2019-12-04 DIAGNOSIS — Z9981 Dependence on supplemental oxygen: Secondary | ICD-10-CM | POA: Diagnosis not present

## 2019-12-04 DIAGNOSIS — J9611 Chronic respiratory failure with hypoxia: Secondary | ICD-10-CM | POA: Diagnosis not present

## 2019-12-11 DIAGNOSIS — I130 Hypertensive heart and chronic kidney disease with heart failure and stage 1 through stage 4 chronic kidney disease, or unspecified chronic kidney disease: Secondary | ICD-10-CM | POA: Diagnosis not present

## 2019-12-11 DIAGNOSIS — Z9981 Dependence on supplemental oxygen: Secondary | ICD-10-CM | POA: Diagnosis not present

## 2019-12-11 DIAGNOSIS — N183 Chronic kidney disease, stage 3 unspecified: Secondary | ICD-10-CM | POA: Diagnosis not present

## 2019-12-11 DIAGNOSIS — J449 Chronic obstructive pulmonary disease, unspecified: Secondary | ICD-10-CM | POA: Diagnosis not present

## 2019-12-11 DIAGNOSIS — J9611 Chronic respiratory failure with hypoxia: Secondary | ICD-10-CM | POA: Diagnosis not present

## 2019-12-11 DIAGNOSIS — E1140 Type 2 diabetes mellitus with diabetic neuropathy, unspecified: Secondary | ICD-10-CM | POA: Diagnosis not present

## 2019-12-11 DIAGNOSIS — K219 Gastro-esophageal reflux disease without esophagitis: Secondary | ICD-10-CM | POA: Diagnosis not present

## 2019-12-11 DIAGNOSIS — E1122 Type 2 diabetes mellitus with diabetic chronic kidney disease: Secondary | ICD-10-CM | POA: Diagnosis not present

## 2019-12-11 DIAGNOSIS — I5030 Unspecified diastolic (congestive) heart failure: Secondary | ICD-10-CM | POA: Diagnosis not present

## 2019-12-14 DIAGNOSIS — I5030 Unspecified diastolic (congestive) heart failure: Secondary | ICD-10-CM | POA: Diagnosis not present

## 2019-12-14 DIAGNOSIS — J9611 Chronic respiratory failure with hypoxia: Secondary | ICD-10-CM | POA: Diagnosis not present

## 2019-12-14 DIAGNOSIS — I130 Hypertensive heart and chronic kidney disease with heart failure and stage 1 through stage 4 chronic kidney disease, or unspecified chronic kidney disease: Secondary | ICD-10-CM | POA: Diagnosis not present

## 2019-12-14 DIAGNOSIS — E1122 Type 2 diabetes mellitus with diabetic chronic kidney disease: Secondary | ICD-10-CM | POA: Diagnosis not present

## 2019-12-14 DIAGNOSIS — E1140 Type 2 diabetes mellitus with diabetic neuropathy, unspecified: Secondary | ICD-10-CM | POA: Diagnosis not present

## 2019-12-14 DIAGNOSIS — K219 Gastro-esophageal reflux disease without esophagitis: Secondary | ICD-10-CM | POA: Diagnosis not present

## 2019-12-14 DIAGNOSIS — J449 Chronic obstructive pulmonary disease, unspecified: Secondary | ICD-10-CM | POA: Diagnosis not present

## 2019-12-14 DIAGNOSIS — N183 Chronic kidney disease, stage 3 unspecified: Secondary | ICD-10-CM | POA: Diagnosis not present

## 2019-12-14 DIAGNOSIS — Z9981 Dependence on supplemental oxygen: Secondary | ICD-10-CM | POA: Diagnosis not present

## 2019-12-26 DIAGNOSIS — J449 Chronic obstructive pulmonary disease, unspecified: Secondary | ICD-10-CM | POA: Diagnosis not present

## 2020-01-09 DIAGNOSIS — I10 Essential (primary) hypertension: Secondary | ICD-10-CM | POA: Diagnosis not present

## 2020-01-09 DIAGNOSIS — J449 Chronic obstructive pulmonary disease, unspecified: Secondary | ICD-10-CM | POA: Diagnosis not present

## 2020-01-09 DIAGNOSIS — M1250 Traumatic arthropathy, unspecified site: Secondary | ICD-10-CM | POA: Diagnosis not present

## 2020-01-09 DIAGNOSIS — E119 Type 2 diabetes mellitus without complications: Secondary | ICD-10-CM | POA: Diagnosis not present

## 2020-01-14 ENCOUNTER — Ambulatory Visit: Payer: Medicare Other | Attending: Internal Medicine

## 2020-01-25 DIAGNOSIS — D649 Anemia, unspecified: Secondary | ICD-10-CM | POA: Diagnosis not present

## 2020-01-25 DIAGNOSIS — I10 Essential (primary) hypertension: Secondary | ICD-10-CM | POA: Diagnosis not present

## 2020-01-25 DIAGNOSIS — E1169 Type 2 diabetes mellitus with other specified complication: Secondary | ICD-10-CM | POA: Diagnosis not present

## 2020-01-25 DIAGNOSIS — J441 Chronic obstructive pulmonary disease with (acute) exacerbation: Secondary | ICD-10-CM | POA: Diagnosis not present

## 2020-01-25 DIAGNOSIS — R0902 Hypoxemia: Secondary | ICD-10-CM | POA: Diagnosis not present

## 2020-01-26 DIAGNOSIS — J449 Chronic obstructive pulmonary disease, unspecified: Secondary | ICD-10-CM | POA: Diagnosis not present

## 2020-02-09 DIAGNOSIS — M1250 Traumatic arthropathy, unspecified site: Secondary | ICD-10-CM | POA: Diagnosis not present

## 2020-02-09 DIAGNOSIS — I10 Essential (primary) hypertension: Secondary | ICD-10-CM | POA: Diagnosis not present

## 2020-02-09 DIAGNOSIS — J449 Chronic obstructive pulmonary disease, unspecified: Secondary | ICD-10-CM | POA: Diagnosis not present

## 2020-02-09 DIAGNOSIS — E119 Type 2 diabetes mellitus without complications: Secondary | ICD-10-CM | POA: Diagnosis not present

## 2020-02-25 DIAGNOSIS — J449 Chronic obstructive pulmonary disease, unspecified: Secondary | ICD-10-CM | POA: Diagnosis not present

## 2020-03-10 ENCOUNTER — Other Ambulatory Visit: Payer: Medicare Other

## 2020-03-27 DIAGNOSIS — J449 Chronic obstructive pulmonary disease, unspecified: Secondary | ICD-10-CM | POA: Diagnosis not present

## 2020-04-27 DIAGNOSIS — J449 Chronic obstructive pulmonary disease, unspecified: Secondary | ICD-10-CM | POA: Diagnosis not present

## 2020-04-29 ENCOUNTER — Telehealth: Payer: Self-pay | Admitting: Pulmonary Disease

## 2020-04-29 NOTE — Telephone Encounter (Signed)
Called and spoke with pt stating to her that she needs to call DME to further discuss prices of getting extra O2 for the trip. Pt verbalized understanding. Nothing further needed.

## 2020-05-09 DIAGNOSIS — M1250 Traumatic arthropathy, unspecified site: Secondary | ICD-10-CM | POA: Diagnosis not present

## 2020-05-09 DIAGNOSIS — I10 Essential (primary) hypertension: Secondary | ICD-10-CM | POA: Diagnosis not present

## 2020-05-09 DIAGNOSIS — J449 Chronic obstructive pulmonary disease, unspecified: Secondary | ICD-10-CM | POA: Diagnosis not present

## 2020-05-09 DIAGNOSIS — E119 Type 2 diabetes mellitus without complications: Secondary | ICD-10-CM | POA: Diagnosis not present

## 2020-05-23 DIAGNOSIS — I110 Hypertensive heart disease with heart failure: Secondary | ICD-10-CM | POA: Diagnosis not present

## 2020-05-23 DIAGNOSIS — E1169 Type 2 diabetes mellitus with other specified complication: Secondary | ICD-10-CM | POA: Diagnosis not present

## 2020-05-23 DIAGNOSIS — I10 Essential (primary) hypertension: Secondary | ICD-10-CM | POA: Diagnosis not present

## 2020-05-23 DIAGNOSIS — J441 Chronic obstructive pulmonary disease with (acute) exacerbation: Secondary | ICD-10-CM | POA: Diagnosis not present

## 2020-05-23 DIAGNOSIS — Z9981 Dependence on supplemental oxygen: Secondary | ICD-10-CM | POA: Diagnosis not present

## 2020-05-23 DIAGNOSIS — R0902 Hypoxemia: Secondary | ICD-10-CM | POA: Diagnosis not present

## 2020-05-25 DIAGNOSIS — J449 Chronic obstructive pulmonary disease, unspecified: Secondary | ICD-10-CM | POA: Diagnosis not present

## 2020-06-25 DIAGNOSIS — J449 Chronic obstructive pulmonary disease, unspecified: Secondary | ICD-10-CM | POA: Diagnosis not present

## 2020-07-25 DIAGNOSIS — J449 Chronic obstructive pulmonary disease, unspecified: Secondary | ICD-10-CM | POA: Diagnosis not present

## 2020-08-24 DIAGNOSIS — I10 Essential (primary) hypertension: Secondary | ICD-10-CM | POA: Diagnosis not present

## 2020-08-24 DIAGNOSIS — E785 Hyperlipidemia, unspecified: Secondary | ICD-10-CM | POA: Diagnosis not present

## 2020-08-24 DIAGNOSIS — E1169 Type 2 diabetes mellitus with other specified complication: Secondary | ICD-10-CM | POA: Diagnosis not present

## 2020-08-24 DIAGNOSIS — J441 Chronic obstructive pulmonary disease with (acute) exacerbation: Secondary | ICD-10-CM | POA: Diagnosis not present

## 2020-08-25 DIAGNOSIS — J449 Chronic obstructive pulmonary disease, unspecified: Secondary | ICD-10-CM | POA: Diagnosis not present

## 2020-08-27 IMAGING — DX DG CHEST 1V PORT
1 series · 1 of 1 positions shown · non-contrast
Comparison: 05/23/2016 chest x-ray and chest CT 10/24/2018

CLINICAL DATA: Hypoxia.

EXAM:
PORTABLE CHEST 1 VIEW

[chest ap]
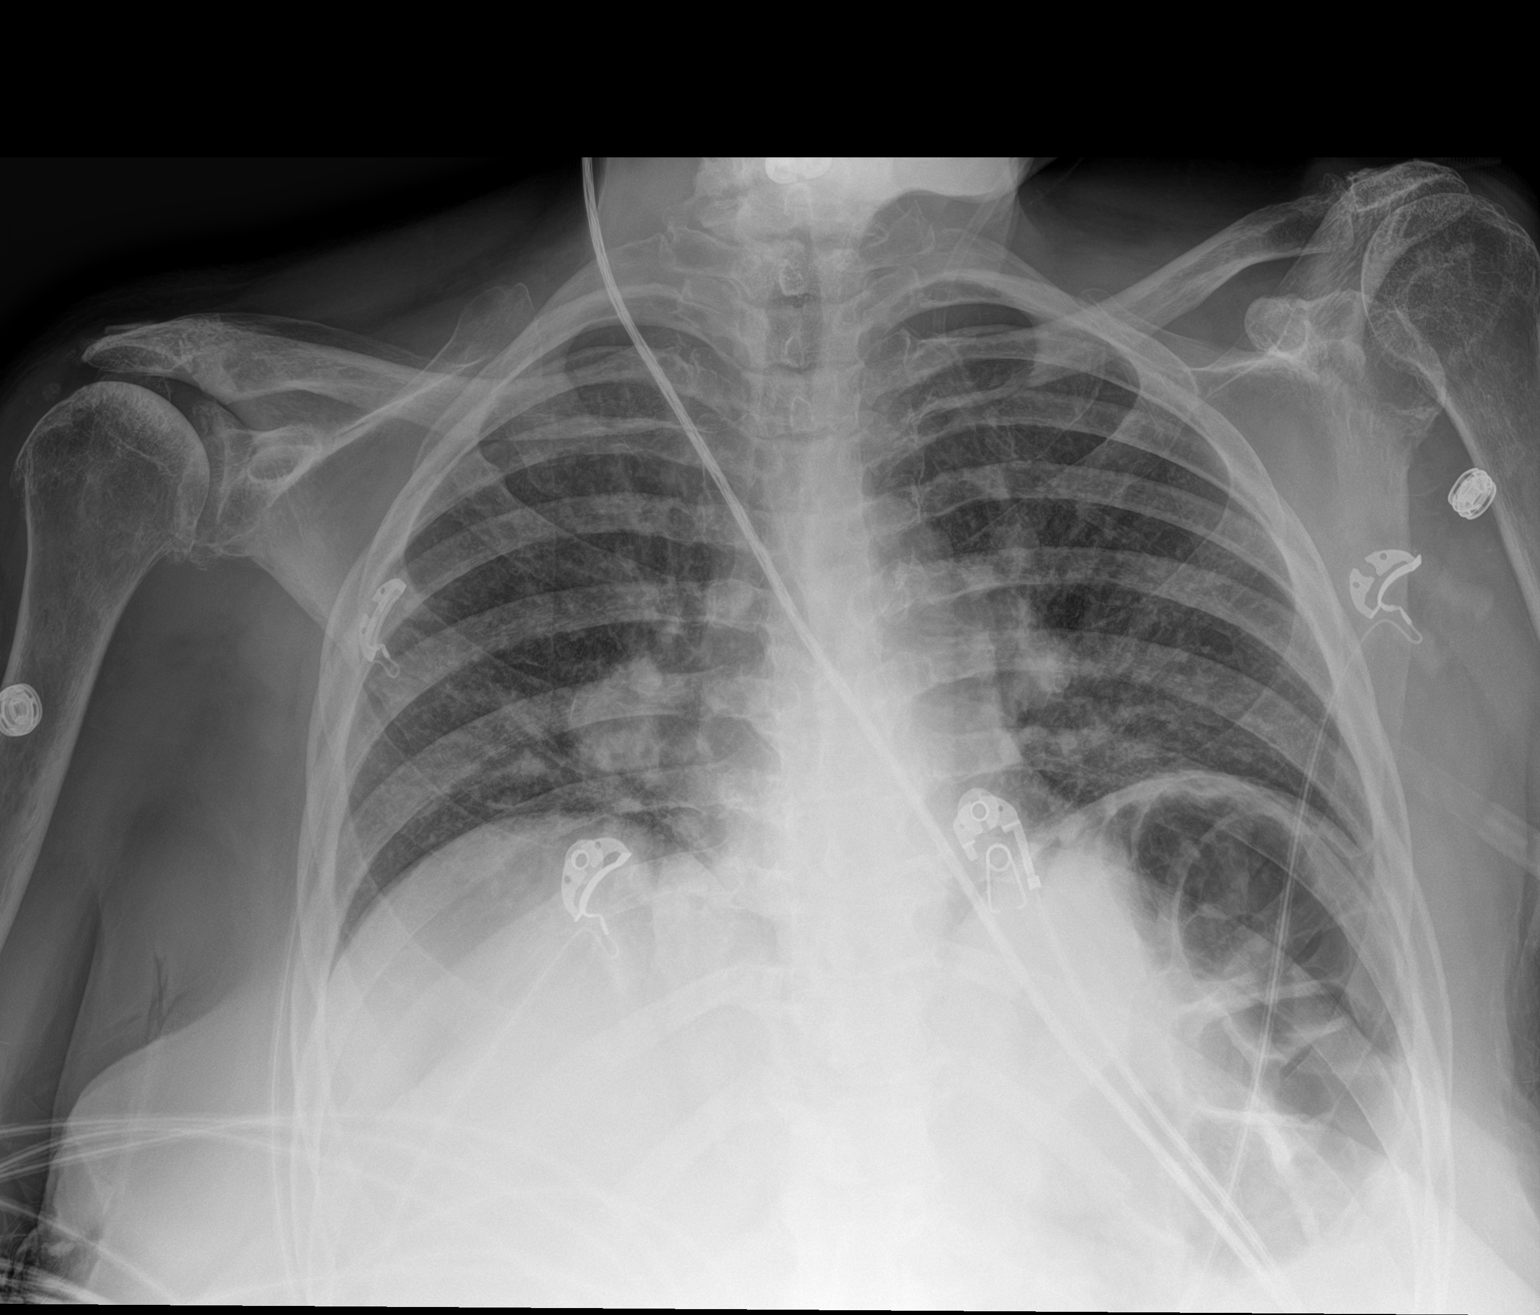

[1 of 1 positions shown; findings below may reference images not displayed]

FINDINGS: The heart is within normal limits in size and stable. Stable
slightly prominent pulmonary hila.

Low lung volumes with vascular crowding and streaky atelectasis.
Stable bronchitic type interstitial lung changes and emphysematous
changes. No definite infiltrates or effusions. The bony thorax is
intact. Stable degenerative changes involving both shoulders.
IMPRESSION: 1. Stable underlying emphysematous changes and bronchitic changes.
2. Low lung volumes with vascular crowding and streaky atelectasis.

## 2020-09-08 DIAGNOSIS — M1250 Traumatic arthropathy, unspecified site: Secondary | ICD-10-CM | POA: Diagnosis not present

## 2020-09-08 DIAGNOSIS — E119 Type 2 diabetes mellitus without complications: Secondary | ICD-10-CM | POA: Diagnosis not present

## 2020-09-08 DIAGNOSIS — I10 Essential (primary) hypertension: Secondary | ICD-10-CM | POA: Diagnosis not present

## 2020-09-08 DIAGNOSIS — J449 Chronic obstructive pulmonary disease, unspecified: Secondary | ICD-10-CM | POA: Diagnosis not present

## 2020-09-13 ENCOUNTER — Other Ambulatory Visit: Payer: Self-pay | Admitting: Pulmonary Disease

## 2020-09-19 ENCOUNTER — Other Ambulatory Visit: Payer: Self-pay | Admitting: Pulmonary Disease

## 2020-09-23 ENCOUNTER — Telehealth: Payer: Self-pay | Admitting: Pulmonary Disease

## 2020-09-23 NOTE — Telephone Encounter (Signed)
ATC, left VM. 

## 2020-09-24 DIAGNOSIS — J449 Chronic obstructive pulmonary disease, unspecified: Secondary | ICD-10-CM | POA: Diagnosis not present

## 2020-09-26 MED ORDER — ALBUTEROL SULFATE HFA 108 (90 BASE) MCG/ACT IN AERS: 1.0000 | INHALATION_SPRAY | RESPIRATORY_TRACT | 4 refills | Status: DC | PRN

## 2020-09-26 NOTE — Telephone Encounter (Signed)
Called and spoke with patient. She stated that she is in need for an albuterol refill. She was last seen by JE in early 2021. She is scheduled for an OV tomorrow. I advised her to keep the appt tomorrow and I would go ahead and send in the albuterol. She also stated that she had been having problems with [facility]. I offered to send the albuterol to her local pharmacy ([facility] in [place]). She agreed to this. RX has been sent.   Nothing further needed at time of call.

## 2020-09-27 ENCOUNTER — Ambulatory Visit: Payer: Medicare Other | Admitting: Pulmonary Disease

## 2020-09-27 ENCOUNTER — Encounter: Payer: Self-pay | Admitting: Pulmonary Disease

## 2020-09-27 ENCOUNTER — Other Ambulatory Visit: Payer: Self-pay

## 2020-09-27 VITALS — BP 120/62 | HR 106 | Ht 63.0 in | Wt 144.4 lb

## 2020-09-27 DIAGNOSIS — Z720 Tobacco use: Secondary | ICD-10-CM | POA: Diagnosis not present

## 2020-09-27 DIAGNOSIS — J449 Chronic obstructive pulmonary disease, unspecified: Secondary | ICD-10-CM | POA: Diagnosis not present

## 2020-09-27 DIAGNOSIS — J9611 Chronic respiratory failure with hypoxia: Secondary | ICD-10-CM | POA: Diagnosis not present

## 2020-09-27 NOTE — Progress Notes (Signed)
Subjective:   PATIENT ID: Debbie Mullins GENDER: female DOB: 1955-03-18, MRN: [id number]   HPI  Chief Complaint  Patient presents with   Follow-up    Hypoxia on 3L of oxygen.    Reason for Visit: Telephone follow-up  Debbie Mullins is a 65 year old female active smoker who presents for COPD follow-up.  Synopsis: She has had multiple hospitalizations in October and November and recently discharged for COPD exacerbation. She was seen by Pulmonary consult team for abnormal CT Chest possibly concerning for hypersensitivity pneumonitis however no risk factors. She was discharged with oxygen and steroids.   Last COPD/CHF exacerbation required hospitalization in 04/2019.  She is using Anoro once daily. Never uses her rescue inhaler. She tries to be compliant with oxygen with exertion and sleep and measures around 94-95. But occasionally does not wear it to church or grocery store. Denies shortness of breath unless with heavy exertion. No wheezing. Sometimes has unproductive cough occasionally. She is working on weight loss and down to 144 lbs. She is previously 172 lbs over a year ago. No hospitalization since then.  Social History: 41 pack-years. Currently smoking 1/2 ppd.  Past Medical History:  Diagnosis Date   Anemia    Asthma    CHF (congestive heart failure) (HCC)    takes Furosemide daily    COPD (chronic obstructive pulmonary disease) (HCC)    Albuterol inhaler prn;SIngulair at night   Depression    takes Wellbutrin daily   Diabetes mellitus    takes Metformin daily   GERD (gastroesophageal reflux disease)    takes Nexium daily   History of colon polyps    benign   Hypertension    takes Lisinopril daily   Iron deficiency anemia 08/01/2017   Joint pain    Joint swelling    Leukocytosis 02/09/2011   Neck pain    HNP   Normocytic anemia 02/09/2011   Peripheral edema    takes Furosemide daily   Pneumonia    many yrs ago   Pulmonary nodules/lesions,  multiple 08/09/2013   Shortness of breath dyspnea    with exertion   Thrombocytosis 02/09/2011   Urinary frequency    Urinary urgency    Weakness    numbness and tingling in both hands     No Known Allergies   Outpatient Medications Prior to Visit  Medication Sig Dispense Refill   albuterol (VENTOLIN HFA) 108 (90 Base) MCG/ACT inhaler Inhale 1-2 puffs into the lungs every 4 (four) hours as needed for wheezing or shortness of breath. 8 g 4   aspirin EC 325 MG tablet Take 325 mg by mouth every morning.      Clobetasol Prop Emollient Base 0.05 % emollient cream Apply 1 application topically 2 (two) times daily as needed (itching).      metFORMIN (GLUCOPHAGE) 500 MG tablet Take 1.5 tablets (750 mg total) by mouth 2 (two) times daily with a meal.     umeclidinium-vilanterol (ANORO ELLIPTA) 62.5-25 MCG/INH AEPB USE 1 INHALATION BY MOUTH  DAILY 180 each 3   alum & mag hydroxide-simeth (MAALOX PLUS) 400-400-40 MG/5ML suspension Take 5 mLs by mouth every 6 (six) hours as needed for indigestion. (Patient not taking: Reported on 09/27/2020) 355 mL 0   amLODipine (NORVASC) 5 MG tablet Take 5 mg by mouth daily.     celecoxib (CELEBREX) 200 MG capsule Take 200 mg by mouth every morning.  (Patient not taking: Reported on 09/27/2020)     esomeprazole ([place])  20 MG capsule Take 1 capsule (20 mg total) by mouth 2 (two) times daily before a meal. (Patient not taking: Reported on 09/27/2020)     furosemide (LASIX) 20 MG tablet Take 2 tablets (40 mg total) by mouth daily. (Patient not taking: Reported on 09/27/2020)     montelukast (SINGULAIR) 10 MG tablet Take 10 mg by mouth at bedtime. (Patient not taking: Reported on 09/27/2020)     ZORVOLEX 35 MG CAPS Take 1 capsule by mouth 3 (three) times daily.     No facility-administered medications prior to visit.    Review of Systems  Constitutional:  Negative for chills, diaphoresis, fever, malaise/fatigue and weight loss.  HENT:  Negative for congestion.    Respiratory:  Positive for cough and shortness of breath. Negative for hemoptysis, sputum production and wheezing.   Cardiovascular:  Negative for chest pain, palpitations and leg swelling.    Objective:   Vitals:   09/27/20 1421  BP: 120/62  Pulse: (!) 106  SpO2: 99%  Weight: 144 lb 6.4 oz (65.5 kg)  Height: 5\' 3"  (1.6 m)   SpO2: 99 %  Physical Exam: General: Well-appearing, no acute distress HENT: [place], AT Eyes: EOMI, no scleral icterus Respiratory: Clear to auscultation bilaterally.  No crackles, wheezing or rales Cardiovascular: RRR, -M/R/G, no JVD Extremities:-Edema,-tenderness Neuro: AAO x4, CNII-XII grossly intact Psych: Normal mood, normal affect   Data Reviewed:  Imaging: CT Chest 01/21/19 - Parenchyma with mosaic attenuation and reticulonodular pattern bilaterally. Stable benign pulmonary nodules  CTA 07/11/19 - No PE. Unchanged mosaic attenulation, mild interlobular septal thickening. Anasarca  PFT: None on file  Ambulatory O2  03/02/19 DME: [employer] Patient Saturations on Room Air at Rest = 84% Patient Saturations on 3 Liters of oxygen while Ambulating = 93% CONCLUSION: --Recommend wearing 2L at rest --Recommend wearing 3L with activity and sleep  09/27/20 SpO2 RA at rest 100% HR 70 No desaturations with activity Interpretation: No supplemental O2 indicated    Assessment & Plan:   Discussion: 65 year old female active smoker who presents for follow-up. Previously uncontrolled COPD and requiring supplemental O2. She has had recent weight loss and working to be more active. Compliant with bronchodilators. Still has dyspnea on exertion. Last exacerbation >1 year.  COPD, unknown severity-uncontrolled  GOLD Class C/D --CONTINUE Anoro ONE puff ONCE a day. This is your EVERYDAY inhaler. --CONTINUE Albuterol as needed. This is your RESCUE inhaler to use as needed for shortness of breath or wheezing --Consider Pulmonary Rehab in the future  Chronic hypoxemic  respiratory failure - improving --CONTINUE supplemental oxygen if <88%  Tobacco abuse Patient is an active smoker. We discussed smoking cessation for 5 minutes. We discussed triggers and stressors and ways to deal with them. We discussed barriers to continued smoking and benefits of smoking cessation. Provided patient with information cessation techniques and interventions including [place] quitline.   We will arrange for pulmonary function tests in the future.   Health Maintenance Immunization History  Administered Date(s) Administered   Influenza Split 01/22/2016   Influenza,inj,Quad PF,6+ Mos 11/18/2014, 01/26/2018, 12/27/2018, 01/03/2020   Influenza,trivalent, recombinat, inj, PF 01/06/2017   Influenza-Unspecified 03/12/2014   Moderna Sars-Covid-2 Vaccination 04/25/2019, 05/10/2019, 06/13/2019   Pneumococcal Polysaccharide-23 08/12/2013   CT Lung Screen - Due. Discuss at next visit  No orders of the defined types were placed in this encounter.  No orders of the defined types were placed in this encounter.  Return in about 6 months (around 03/30/2021).  Debbie Mckesson Rodman Pickle, MD [facility] 09/27/2020 2:45 PM  Office Number [phone]

## 2020-09-27 NOTE — Patient Instructions (Addendum)
COPD, unknown severity - uncontrolled GOLD Class C/D --CONTINUE Anoro ONE puff ONCE a day. This is your EVERYDAY inhaler. --CONTINUE Albuterol as needed. This is your RESCUE inhaler to use as needed for shortness of breath or wheezing  Chronic hypoxemic respiratory failure - improving --CONTINUE supplemental oxygen if <88%   Follow-up with me in 6 months

## 2020-10-09 DIAGNOSIS — M1250 Traumatic arthropathy, unspecified site: Secondary | ICD-10-CM | POA: Diagnosis not present

## 2020-10-09 DIAGNOSIS — I10 Essential (primary) hypertension: Secondary | ICD-10-CM | POA: Diagnosis not present

## 2020-10-09 DIAGNOSIS — E119 Type 2 diabetes mellitus without complications: Secondary | ICD-10-CM | POA: Diagnosis not present

## 2020-10-09 DIAGNOSIS — J449 Chronic obstructive pulmonary disease, unspecified: Secondary | ICD-10-CM | POA: Diagnosis not present

## 2020-10-25 DIAGNOSIS — J449 Chronic obstructive pulmonary disease, unspecified: Secondary | ICD-10-CM | POA: Diagnosis not present

## 2020-10-27 ENCOUNTER — Encounter: Payer: Self-pay | Admitting: Pulmonary Disease

## 2020-11-09 DIAGNOSIS — M1250 Traumatic arthropathy, unspecified site: Secondary | ICD-10-CM | POA: Diagnosis not present

## 2020-11-09 DIAGNOSIS — E119 Type 2 diabetes mellitus without complications: Secondary | ICD-10-CM | POA: Diagnosis not present

## 2020-11-09 DIAGNOSIS — J449 Chronic obstructive pulmonary disease, unspecified: Secondary | ICD-10-CM | POA: Diagnosis not present

## 2020-11-09 DIAGNOSIS — I10 Essential (primary) hypertension: Secondary | ICD-10-CM | POA: Diagnosis not present

## 2020-11-25 DIAGNOSIS — J449 Chronic obstructive pulmonary disease, unspecified: Secondary | ICD-10-CM | POA: Diagnosis not present

## 2020-12-09 DIAGNOSIS — J449 Chronic obstructive pulmonary disease, unspecified: Secondary | ICD-10-CM | POA: Diagnosis not present

## 2020-12-09 DIAGNOSIS — M1250 Traumatic arthropathy, unspecified site: Secondary | ICD-10-CM | POA: Diagnosis not present

## 2020-12-09 DIAGNOSIS — E119 Type 2 diabetes mellitus without complications: Secondary | ICD-10-CM | POA: Diagnosis not present

## 2020-12-09 DIAGNOSIS — I10 Essential (primary) hypertension: Secondary | ICD-10-CM | POA: Diagnosis not present

## 2020-12-25 DIAGNOSIS — J449 Chronic obstructive pulmonary disease, unspecified: Secondary | ICD-10-CM | POA: Diagnosis not present

## 2020-12-26 DIAGNOSIS — R0902 Hypoxemia: Secondary | ICD-10-CM | POA: Diagnosis not present

## 2020-12-26 DIAGNOSIS — E1169 Type 2 diabetes mellitus with other specified complication: Secondary | ICD-10-CM | POA: Diagnosis not present

## 2020-12-26 DIAGNOSIS — J441 Chronic obstructive pulmonary disease with (acute) exacerbation: Secondary | ICD-10-CM | POA: Diagnosis not present

## 2020-12-26 DIAGNOSIS — I10 Essential (primary) hypertension: Secondary | ICD-10-CM | POA: Diagnosis not present

## 2021-01-25 DIAGNOSIS — J449 Chronic obstructive pulmonary disease, unspecified: Secondary | ICD-10-CM | POA: Diagnosis not present

## 2021-02-08 DIAGNOSIS — M1250 Traumatic arthropathy, unspecified site: Secondary | ICD-10-CM | POA: Diagnosis not present

## 2021-02-08 DIAGNOSIS — J449 Chronic obstructive pulmonary disease, unspecified: Secondary | ICD-10-CM | POA: Diagnosis not present

## 2021-02-08 DIAGNOSIS — I10 Essential (primary) hypertension: Secondary | ICD-10-CM | POA: Diagnosis not present

## 2021-02-08 DIAGNOSIS — E119 Type 2 diabetes mellitus without complications: Secondary | ICD-10-CM | POA: Diagnosis not present

## 2021-02-24 DIAGNOSIS — J449 Chronic obstructive pulmonary disease, unspecified: Secondary | ICD-10-CM | POA: Diagnosis not present

## 2021-03-22 ENCOUNTER — Encounter (HOSPITAL_COMMUNITY): Payer: Self-pay | Admitting: Internal Medicine

## 2021-03-29 ENCOUNTER — Ambulatory Visit: Payer: Medicare Other | Admitting: Pulmonary Disease

## 2021-03-31 ENCOUNTER — Encounter (HOSPITAL_COMMUNITY): Payer: Self-pay | Admitting: Internal Medicine

## 2021-04-07 ENCOUNTER — Encounter: Payer: Self-pay | Admitting: Pulmonary Disease

## 2021-04-07 ENCOUNTER — Encounter (HOSPITAL_COMMUNITY): Payer: Self-pay | Admitting: Internal Medicine

## 2021-04-07 ENCOUNTER — Ambulatory Visit: Payer: Commercial Managed Care - HMO | Admitting: Pulmonary Disease

## 2021-04-07 ENCOUNTER — Other Ambulatory Visit: Payer: Self-pay

## 2021-04-07 VITALS — BP 130/70 | HR 84 | Ht 61.0 in | Wt 146.8 lb

## 2021-04-07 DIAGNOSIS — J449 Chronic obstructive pulmonary disease, unspecified: Secondary | ICD-10-CM

## 2021-04-07 DIAGNOSIS — J9611 Chronic respiratory failure with hypoxia: Secondary | ICD-10-CM

## 2021-04-07 DIAGNOSIS — F17210 Nicotine dependence, cigarettes, uncomplicated: Secondary | ICD-10-CM | POA: Diagnosis not present

## 2021-04-07 DIAGNOSIS — Z720 Tobacco use: Secondary | ICD-10-CM

## 2021-04-07 MED ORDER — ALBUTEROL SULFATE HFA 108 (90 BASE) MCG/ACT IN AERS: 1.0000 | INHALATION_SPRAY | RESPIRATORY_TRACT | 5 refills | Status: DC | PRN

## 2021-04-07 MED ORDER — ANORO ELLIPTA 62.5-25 MCG/ACT IN AEPB: 1.0000 | INHALATION_SPRAY | Freq: Every day | RESPIRATORY_TRACT | 5 refills | Status: DC

## 2021-04-07 NOTE — Progress Notes (Signed)
Subjective:   PATIENT ID: Debbie Mullins GENDER: female DOB: November 15, 1955, MRN: [id number]   HPI  Chief Complaint  Patient presents with   Follow-up    No updates   Reason for Visit: Follow-up  Debbie Mullins is a 66 year old female active smoker who presents for COPD follow-up.   04/07/21 At our last visit in July 2022, she was prescribed Anoro however ran out awhile ago. She reports using shortness of breath. Last COPD/CHF exacerbation required hospitalization in 04/2019. Denies cough or wheezing. No nocturnal symptoms. Denies any exacerbations since our last visit. She has been using her Albuterol twice a day but this will make her jittery. She like Anoro because she did not have an issues with side effects. She is compliant with her oxygen. She uses her walker and ambulate from one end of the room to the other. She does sitting exercises. She has decreased from 1/2 pdd to 4 cigarettes daily.    Social History: 41 pack-years. Currently smoking 1/2 ppd.  Past Medical History:  Diagnosis Date   Anemia    Asthma    CHF (congestive heart failure) (HCC)    takes Furosemide daily    COPD (chronic obstructive pulmonary disease) (HCC)    Albuterol inhaler prn;SIngulair at night   Depression    takes Wellbutrin daily   Diabetes mellitus    takes Metformin daily   GERD (gastroesophageal reflux disease)    takes Nexium daily   History of colon polyps    benign   Hypertension    takes Lisinopril daily   Iron deficiency anemia 08/01/2017   Joint pain    Joint swelling    Leukocytosis 02/09/2011   Neck pain    HNP   Normocytic anemia 02/09/2011   Peripheral edema    takes Furosemide daily   Pneumonia    many yrs ago   Pulmonary nodules/lesions, multiple 08/09/2013   Shortness of breath dyspnea    with exertion   Thrombocytosis 02/09/2011   Urinary frequency    Urinary urgency    Weakness    numbness and tingling in both hands     No Known Allergies    Outpatient Medications Prior to Visit  Medication Sig Dispense Refill   albuterol (VENTOLIN HFA) 108 (90 Base) MCG/ACT inhaler Inhale 1-2 puffs into the lungs every 4 (four) hours as needed for wheezing or shortness of breath. 8 g 4   amLODipine (NORVASC) 5 MG tablet Take 5 mg by mouth daily.     aspirin EC 325 MG tablet Take 325 mg by mouth every morning.      atorvastatin (LIPITOR) 10 MG tablet Take by mouth.     Clobetasol Prop Emollient Base 0.05 % emollient cream Apply 1 application topically 2 (two) times daily as needed (itching).      metFORMIN (GLUCOPHAGE) 500 MG tablet Take 1.5 tablets (750 mg total) by mouth 2 (two) times daily with a meal.     pantoprazole (PROTONIX) 40 MG tablet Take 40 mg by mouth daily.     furosemide (LASIX) 20 MG tablet Take 2 tablets (40 mg total) by mouth daily. (Patient not taking: Reported on 09/27/2020)     umeclidinium-vilanterol (ANORO ELLIPTA) 62.5-25 MCG/INH AEPB USE 1 INHALATION BY MOUTH  DAILY (Patient not taking: Reported on 04/07/2021) 180 each 3   ZORVOLEX 35 MG CAPS Take 1 capsule by mouth 3 (three) times daily. (Patient not taking: Reported on 04/07/2021)     No facility-administered  medications prior to visit.    Review of Systems  Constitutional:  Negative for chills, diaphoresis, fever, malaise/fatigue and weight loss.  HENT:  Negative for congestion.   Respiratory:  Positive for shortness of breath. Negative for cough, hemoptysis, sputum production and wheezing.   Cardiovascular:  Negative for chest pain, palpitations and leg swelling.    Objective:   Vitals:   04/07/21 1453  BP: 130/70  Pulse: 84  SpO2: 100%  Weight: 146 lb 12.8 oz (66.6 kg)  Height: 5\' 1"  (1.549 m)   SpO2: 100 % (3L) O2 Device: Nasal cannula O2 Flow Rate (L/min): 3 L/min O2 Type: Continuous O2  Physical Exam: General: Well-appearing, no acute distress HENT: [place], AT Eyes: EOMI, no scleral icterus Respiratory: Clear to auscultation bilaterally.  No  crackles, wheezing or rales Cardiovascular: RRR, -M/R/G, no JVD Extremities:-Edema,-tenderness Neuro: AAO x4, CNII-XII grossly intact Psych: Normal mood, normal affect  Data Reviewed:  Imaging: CT Chest 01/21/19 - Parenchyma with mosaic attenuation and reticulonodular pattern bilaterally. Stable benign pulmonary nodules  CTA 07/11/19 - No PE. Unchanged mosaic attenulation, mild interlobular septal thickening. Anasarca  PFT: None on file  Ambulatory O2  03/02/19 DME: [employer] Patient Saturations on Room Air at Rest = 84% Patient Saturations on 3 Liters of oxygen while Ambulating = 93% CONCLUSION: --Recommend wearing 2L at rest --Recommend wearing 3L with activity and sleep  09/27/20 SpO2 RA at rest 100% HR 70 No desaturations with activity Interpretation: No supplemental O2 indicated    Assessment & Plan:   Discussion: 66 year old female active smoker who presents for COPD follow-up. In the last year she has had significant weight loss >30lbs and working to be more active. She is compliant with her bronchodilators until she ran out. Last exacerbation >1 year. Continues to have dyspnea on exertion which she attributes to prior inactivity. Discussed clinical course and management of COPD/asthma including bronchodilator regimen and action plan for exacerbation.  COPD, unknown severity- uncontrolled GOLD Class C/D --CONTINUE Anoro ONE puff ONCE a day. This is your EVERYDAY inhaler. REFILL --CONTINUE Albuterol as needed. This is your RESCUE inhaler to use as needed for shortness of breath or wheezing. REFILL  Chronic hypoxemic respiratory failure  --CONTINUE supplemental oxygen if <88%  Tobacco abuse Patient is an active smoker. We discussed smoking cessation for 3 minutes. We discussed triggers and stressors and ways to deal with them. We discussed barriers to continued smoking and benefits of smoking cessation. Provided patient with information cessation techniques and interventions  including [place] quitline.   Health Maintenance Immunization History  Administered Date(s) Administered   Influenza Split 01/22/2016   Influenza,inj,Quad PF,6+ Mos 11/18/2014, 01/26/2018, 12/27/2018, 01/03/2020, 01/08/2021   Influenza,trivalent, recombinat, inj, PF 01/06/2017   Influenza-Unspecified 03/12/2014   Moderna Sars-Covid-2 Vaccination 04/25/2019, 05/10/2019, 06/13/2019   Pneumococcal Polysaccharide-23 08/12/2013   CT Lung Screen - Due. Discuss at next visit  No orders of the defined types were placed in this encounter.  Meds ordered this encounter  Medications   umeclidinium-vilanterol (ANORO ELLIPTA) 62.5-25 MCG/ACT AEPB    Sig: Inhale 1 puff into the lungs daily.    Dispense:  1 each    Refill:  5   albuterol (VENTOLIN HFA) 108 (90 Base) MCG/ACT inhaler    Sig: Inhale 1-2 puffs into the lungs every 4 (four) hours as needed for wheezing or shortness of breath.    Dispense:  8 g    Refill:  5    Return in about 3 months (around 07/06/2021).  I have spent a total time of 30-minutes on the day of the appointment reviewing prior documentation, coordinating care and discussing medical diagnosis and plan with the patient/family. Past medical history, allergies, medications were reviewed. Pertinent imaging, labs and tests included in this note have been reviewed and interpreted independently by me.  Melita Villalona Rodman Pickle, MD [facility] 04/07/2021 3:29 PM  Office Number [phone]

## 2021-04-07 NOTE — Patient Instructions (Signed)
COPD, unknown severity- uncontrolled GOLD Class C/D --CONTINUE Anoro ONE puff ONCE a day. This is your EVERYDAY inhaler. REFILL --CONTINUE Albuterol as needed. This is your RESCUE inhaler to use as needed for shortness of breath or wheezing. REFILL  Chronic hypoxemic respiratory failure  --CONTINUE supplemental oxygen if <88% --Plan for ambulatory O2 at next visit  Tobacco abuse Patient is an active smoker. We discussed smoking cessation for 3 minutes. We discussed triggers and stressors and ways to deal with them. We discussed barriers to continued smoking and benefits of smoking cessation. Provided patient with information cessation techniques and interventions including [place] quitline.  Follow-up with me 3 months.

## 2021-04-09 DIAGNOSIS — E119 Type 2 diabetes mellitus without complications: Secondary | ICD-10-CM | POA: Diagnosis not present

## 2021-04-09 DIAGNOSIS — J449 Chronic obstructive pulmonary disease, unspecified: Secondary | ICD-10-CM | POA: Diagnosis not present

## 2021-04-09 DIAGNOSIS — M1250 Traumatic arthropathy, unspecified site: Secondary | ICD-10-CM | POA: Diagnosis not present

## 2021-04-09 DIAGNOSIS — I10 Essential (primary) hypertension: Secondary | ICD-10-CM | POA: Diagnosis not present

## 2021-04-13 ENCOUNTER — Encounter: Payer: Self-pay | Admitting: Pulmonary Disease

## 2021-04-24 DIAGNOSIS — I10 Essential (primary) hypertension: Secondary | ICD-10-CM | POA: Diagnosis not present

## 2021-04-24 DIAGNOSIS — J441 Chronic obstructive pulmonary disease with (acute) exacerbation: Secondary | ICD-10-CM | POA: Diagnosis not present

## 2021-04-24 DIAGNOSIS — E785 Hyperlipidemia, unspecified: Secondary | ICD-10-CM | POA: Diagnosis not present

## 2021-04-27 DIAGNOSIS — J449 Chronic obstructive pulmonary disease, unspecified: Secondary | ICD-10-CM | POA: Diagnosis not present

## 2021-05-25 DIAGNOSIS — J449 Chronic obstructive pulmonary disease, unspecified: Secondary | ICD-10-CM | POA: Diagnosis not present

## 2021-06-09 DIAGNOSIS — J449 Chronic obstructive pulmonary disease, unspecified: Secondary | ICD-10-CM | POA: Diagnosis not present

## 2021-06-09 DIAGNOSIS — I10 Essential (primary) hypertension: Secondary | ICD-10-CM | POA: Diagnosis not present

## 2021-06-09 DIAGNOSIS — E119 Type 2 diabetes mellitus without complications: Secondary | ICD-10-CM | POA: Diagnosis not present

## 2021-06-09 DIAGNOSIS — M1250 Traumatic arthropathy, unspecified site: Secondary | ICD-10-CM | POA: Diagnosis not present

## 2021-06-25 DIAGNOSIS — J449 Chronic obstructive pulmonary disease, unspecified: Secondary | ICD-10-CM | POA: Diagnosis not present

## 2021-06-27 ENCOUNTER — Encounter (HOSPITAL_COMMUNITY): Payer: Self-pay | Admitting: Internal Medicine

## 2021-07-03 ENCOUNTER — Encounter: Payer: Self-pay | Admitting: Pulmonary Disease

## 2021-07-03 ENCOUNTER — Ambulatory Visit: Payer: Medicare Other | Admitting: Pulmonary Disease

## 2021-07-03 VITALS — BP 134/68 | HR 103 | Ht 61.0 in | Wt 149.2 lb

## 2021-07-03 DIAGNOSIS — J449 Chronic obstructive pulmonary disease, unspecified: Secondary | ICD-10-CM | POA: Diagnosis not present

## 2021-07-03 DIAGNOSIS — F17210 Nicotine dependence, cigarettes, uncomplicated: Secondary | ICD-10-CM | POA: Diagnosis not present

## 2021-07-03 DIAGNOSIS — J9611 Chronic respiratory failure with hypoxia: Secondary | ICD-10-CM | POA: Diagnosis not present

## 2021-07-03 MED ORDER — ANORO ELLIPTA 62.5-25 MCG/ACT IN AEPB: 1.0000 | INHALATION_SPRAY | Freq: Every day | RESPIRATORY_TRACT | 5 refills | Status: DC

## 2021-07-03 NOTE — Progress Notes (Signed)
? ? ?Subjective:  ? ?PATIENT ID: Debbie Mullins GENDER: female DOB: 05/01/1955, MRN: [id number] ? ? ?HPI ? ?Chief Complaint  ?Patient presents with  ? Follow-up  ?  No updates, feeling ok  ? ?Reason for Visit: Follow-up ? ?Debbie Mullins is a 66 year old female active smoker who presents for COPD follow-up. ?  ?04/07/21 ?At our last visit in July 2022, she was prescribed Anoro however ran out awhile ago. She reports using shortness of breath. Last COPD/CHF exacerbation required hospitalization in 04/2019. Denies cough or wheezing. No nocturnal symptoms. Denies any exacerbations since our last visit. She has been using her Albuterol twice a day but this will make her jittery. She like Anoro because she did not have an issues with side effects. She is compliant with her oxygen. She uses her walker and ambulate from one end of the room to the other. She does sitting exercises. She has decreased from 1/2 pdd to 4 cigarettes daily.  ? ?07/03/21 ?Since our last visit she reports well-controlled symptoms on Anoro. Has not needed her albuterol inhaler. Denies shortness of breath, wheezing or chronic cough. No exacerbations for >12 months. She limits her allergy exposure including staying at home during pollen season. Walks daily. Compliant with her oxygen. She is smoking 1/4 pack day ? ?Social History: ?41 pack-years. Currently smoking 1/4 ppd. ? ?Past Medical History:  ?Diagnosis Date  ? Anemia   ? Asthma   ? CHF (congestive heart failure) ([place])   ? takes Furosemide daily   ? COPD (chronic obstructive pulmonary disease) ([place])   ? Albuterol inhaler prn;SIngulair at night  ? Depression   ? takes Wellbutrin daily  ? Diabetes mellitus   ? takes Metformin daily  ? GERD (gastroesophageal reflux disease)   ? takes Nexium daily  ? History of colon polyps   ? benign  ? Hypertension   ? takes Lisinopril daily  ? Iron deficiency anemia 08/01/2017  ? Joint pain   ? Joint swelling   ? Leukocytosis 02/09/2011  ? Neck pain   ? HNP   ? Normocytic anemia 02/09/2011  ? Peripheral edema   ? takes Furosemide daily  ? Pneumonia   ? many yrs ago  ? Pulmonary nodules/lesions, multiple 08/09/2013  ? Shortness of breath dyspnea   ? with exertion  ? Thrombocytosis 02/09/2011  ? Urinary frequency   ? Urinary urgency   ? Weakness   ? numbness and tingling in both hands  ?  ? ?No Known Allergies  ? ?Outpatient Medications Prior to Visit  ?Medication Sig Dispense Refill  ? albuterol (VENTOLIN HFA) 108 (90 Base) MCG/ACT inhaler Inhale 1-2 puffs into the lungs every 4 (four) hours as needed for wheezing or shortness of breath. 8 g 5  ? amLODipine (NORVASC) 5 MG tablet Take 5 mg by mouth daily.    ? aspirin EC 325 MG tablet Take 325 mg by mouth every morning.     ? atorvastatin (LIPITOR) 10 MG tablet Take by mouth.    ? Clobetasol Prop Emollient Base 0.05 % emollient cream Apply 1 application topically 2 (two) times daily as needed (itching).     ? metFORMIN (GLUCOPHAGE) 500 MG tablet Take 1.5 tablets (750 mg total) by mouth 2 (two) times daily with a meal.    ? pantoprazole (PROTONIX) 40 MG tablet Take 40 mg by mouth daily.    ? umeclidinium-vilanterol (ANORO ELLIPTA) 62.5-25 MCG/ACT AEPB Inhale 1 puff into the lungs daily. 1 each 5  ?  furosemide (LASIX) 20 MG tablet Take 2 tablets (40 mg total) by mouth daily.    ? ZORVOLEX 35 MG CAPS Take 1 capsule by mouth 3 (three) times daily.    ? ?No facility-administered medications prior to visit.  ? ? ?Review of Systems  ?Constitutional:  Negative for chills, diaphoresis, fever, malaise/fatigue and weight loss.  ?HENT:  Negative for congestion.   ?Respiratory:  Negative for cough, hemoptysis, sputum production, shortness of breath and wheezing.   ?Cardiovascular:  Negative for chest pain, palpitations and leg swelling.  ? ? ?Objective:  ? ?Vitals:  ? 07/03/21 1346  ?BP: 134/68  ?Pulse: (!) 103  ?SpO2: 100%  ?Weight: 149 lb 3.2 oz (67.7 kg)  ?Height: '5\' 1"'$  (1.549 m)  ? ?SpO2: 100 % (3L) ?O2 Device: Nasal cannula ?O2  Flow Rate (L/min): 3 L/min ?O2 Type: Continuous O2 ? ?Physical Exam: ?General: Well-appearing, no acute distress ?HENT: [place], AT ?Eyes: EOMI, no scleral icterus ?Respiratory: Clear to auscultation bilaterally.  No crackles, wheezing or rales ?Cardiovascular: RRR, -M/R/G, no JVD ?Extremities:-Edema,-tenderness ?Neuro: AAO x4, CNII-XII grossly intact ?Psych: Normal mood, normal affect ? ?Data Reviewed: ? ?Imaging: ?CT Chest 01/21/19 - Parenchyma with mosaic attenuation and reticulonodular pattern bilaterally. Stable benign pulmonary nodules  ?CTA 07/25/19 - No PE. Unchanged mosaic attenulation, mild interlobular septal thickening. Anasarca ? ?PFT: ?None on file ? ?Ambulatory O2  ?03/02/19 ?DME: [employer] ?Patient Saturations on Room Air at Rest = 84% ?Patient Saturations on 3 Liters of oxygen while Ambulating = 93% ?CONCLUSION: ?--Recommend wearing 2L at rest ?--Recommend wearing 3L with activity and sleep ? ?07/03/21 ?Ambulatory O2 - No desaturations on ambulation ?   ?Assessment & Plan:  ? ?Discussion: ?66 year old female active smoker who present for COPD follow-up. Last exacerbation >1.5 years. Well-controlled. Compliant with therapy. Discussed clinical course and management of COPD including bronchodilator regimen and action plan for exacerbation. Chronic respiratory failure has resolved however patient wears for dyspnea and when ill. Advised against smoking while wearing oxygen due to combustion risk. ? ?COPD, unknown severity ?GOLD Class C/D ?--CONTINUE Anoro ONE puff ONCE a day. This is your EVERYDAY inhaler. REFILL ?--CONTINUE Albuterol as needed. This is your RESCUE inhaler to use as needed for shortness of breath or wheezing ?--CONTINUE supplemental oxygen if <88% ? ?Tobacco abuse ?Patient is an active smoker. ?We discussed smoking cessation for 3 minutes. We discussed triggers and stressors and ways to deal with them. We discussed barriers to continued smoking and benefits of smoking cessation. Provided patient  with information cessation techniques and interventions including [place] quitline. ? ?Health Maintenance ?Immunization History  ?Administered Date(s) Administered  ? Influenza Split 01/22/2016  ? Influenza,inj,Quad PF,6+ Mos 11/18/2014, 01/26/2018, 12/27/2018, 01/03/2020, 01/08/2021  ? Influenza,trivalent, recombinat, inj, PF 01/06/2017  ? Influenza-Unspecified 03/12/2014  ? Moderna Sars-Covid-2 Vaccination 04/25/2019, 05/10/2019, 06/13/2019  ? Pneumococcal Polysaccharide-23 08/12/2013  ? ?CT Lung Screen - Due. Discuss at next visit ? ?No orders of the defined types were placed in this encounter. ? ?Meds ordered this encounter  ?Medications  ? DISCONTD: umeclidinium-vilanterol (ANORO ELLIPTA) 62.5-25 MCG/ACT AEPB  ?  Sig: Inhale 1 puff into the lungs daily.  ?  Dispense:  1 each  ?  Refill:  5  ? umeclidinium-vilanterol (ANORO ELLIPTA) 62.5-25 MCG/ACT AEPB  ?  Sig: Inhale 1 puff into the lungs daily.  ?  Dispense:  1 each  ?  Refill:  5  ? ? ?Return in about 6 months (around 01/02/2022). ? ?I have spent a total time  of 32-minutes on the day of the appointment reviewing prior documentation, coordinating care and discussing medical diagnosis and plan with the patient/family. Past medical history, allergies, medications were reviewed. Pertinent imaging, labs and tests included in this note have been reviewed and interpreted independently by me. ? ?Jadah Bobak Rodman Pickle, MD ?[facility] ?07/03/2021 7:40 PM  ?Office Number [phone] ? ? ?

## 2021-07-03 NOTE — Patient Instructions (Signed)
COPD, unknown severity ?GOLD Class C/D ?--CONTINUE Anoro ONE puff ONCE a day. This is your EVERYDAY inhaler. REFILL ?--CONTINUE Albuterol as needed. This is your RESCUE inhaler to use as needed for shortness of breath or wheezing ? ?Chronic hypoxemic respiratory failure  ?--CONTINUE supplemental oxygen if <88% ? ?Tobacco Abuse ?--You are doing great! Continue to cut down on smoking! ? ?Follow-up with me in 6 month ?

## 2021-07-04 ENCOUNTER — Ambulatory Visit: Payer: Commercial Managed Care - HMO | Admitting: Pulmonary Disease

## 2021-07-25 DIAGNOSIS — J449 Chronic obstructive pulmonary disease, unspecified: Secondary | ICD-10-CM | POA: Diagnosis not present

## 2021-08-09 DIAGNOSIS — E119 Type 2 diabetes mellitus without complications: Secondary | ICD-10-CM | POA: Diagnosis not present

## 2021-08-09 DIAGNOSIS — I10 Essential (primary) hypertension: Secondary | ICD-10-CM | POA: Diagnosis not present

## 2021-08-22 DIAGNOSIS — E1169 Type 2 diabetes mellitus with other specified complication: Secondary | ICD-10-CM | POA: Diagnosis not present

## 2021-08-22 DIAGNOSIS — Z9981 Dependence on supplemental oxygen: Secondary | ICD-10-CM | POA: Diagnosis not present

## 2021-08-22 DIAGNOSIS — J449 Chronic obstructive pulmonary disease, unspecified: Secondary | ICD-10-CM | POA: Diagnosis not present

## 2021-08-22 DIAGNOSIS — Z0000 Encounter for general adult medical examination without abnormal findings: Secondary | ICD-10-CM | POA: Diagnosis not present

## 2021-08-22 DIAGNOSIS — I10 Essential (primary) hypertension: Secondary | ICD-10-CM | POA: Diagnosis not present

## 2021-08-25 DIAGNOSIS — J449 Chronic obstructive pulmonary disease, unspecified: Secondary | ICD-10-CM | POA: Diagnosis not present

## 2021-09-08 DIAGNOSIS — E119 Type 2 diabetes mellitus without complications: Secondary | ICD-10-CM | POA: Diagnosis not present

## 2021-09-08 DIAGNOSIS — I10 Essential (primary) hypertension: Secondary | ICD-10-CM | POA: Diagnosis not present

## 2021-09-24 DIAGNOSIS — J449 Chronic obstructive pulmonary disease, unspecified: Secondary | ICD-10-CM | POA: Diagnosis not present

## 2021-10-25 DIAGNOSIS — J449 Chronic obstructive pulmonary disease, unspecified: Secondary | ICD-10-CM | POA: Diagnosis not present

## 2021-11-09 DIAGNOSIS — E119 Type 2 diabetes mellitus without complications: Secondary | ICD-10-CM | POA: Diagnosis not present

## 2021-11-09 DIAGNOSIS — I10 Essential (primary) hypertension: Secondary | ICD-10-CM | POA: Diagnosis not present

## 2021-11-09 DIAGNOSIS — J449 Chronic obstructive pulmonary disease, unspecified: Secondary | ICD-10-CM | POA: Diagnosis not present

## 2021-11-21 DIAGNOSIS — E119 Type 2 diabetes mellitus without complications: Secondary | ICD-10-CM | POA: Diagnosis not present

## 2021-11-25 DIAGNOSIS — J449 Chronic obstructive pulmonary disease, unspecified: Secondary | ICD-10-CM | POA: Diagnosis not present

## 2021-11-30 DIAGNOSIS — E1169 Type 2 diabetes mellitus with other specified complication: Secondary | ICD-10-CM | POA: Diagnosis not present

## 2021-12-09 DIAGNOSIS — I10 Essential (primary) hypertension: Secondary | ICD-10-CM | POA: Diagnosis not present

## 2021-12-09 DIAGNOSIS — E119 Type 2 diabetes mellitus without complications: Secondary | ICD-10-CM | POA: Diagnosis not present

## 2021-12-12 ENCOUNTER — Ambulatory Visit: Payer: Medicare Other | Admitting: Pulmonary Disease

## 2021-12-22 DIAGNOSIS — J449 Chronic obstructive pulmonary disease, unspecified: Secondary | ICD-10-CM | POA: Diagnosis not present

## 2021-12-22 DIAGNOSIS — I10 Essential (primary) hypertension: Secondary | ICD-10-CM | POA: Diagnosis not present

## 2021-12-22 DIAGNOSIS — E1169 Type 2 diabetes mellitus with other specified complication: Secondary | ICD-10-CM | POA: Diagnosis not present

## 2021-12-22 DIAGNOSIS — Z9981 Dependence on supplemental oxygen: Secondary | ICD-10-CM | POA: Diagnosis not present

## 2021-12-22 DIAGNOSIS — Z23 Encounter for immunization: Secondary | ICD-10-CM | POA: Diagnosis not present

## 2021-12-25 DIAGNOSIS — J449 Chronic obstructive pulmonary disease, unspecified: Secondary | ICD-10-CM | POA: Diagnosis not present

## 2022-01-09 DIAGNOSIS — M1250 Traumatic arthropathy, unspecified site: Secondary | ICD-10-CM | POA: Diagnosis not present

## 2022-01-09 DIAGNOSIS — I10 Essential (primary) hypertension: Secondary | ICD-10-CM | POA: Diagnosis not present

## 2022-01-09 DIAGNOSIS — E119 Type 2 diabetes mellitus without complications: Secondary | ICD-10-CM | POA: Diagnosis not present

## 2022-01-24 ENCOUNTER — Ambulatory Visit: Payer: Medicare Other | Admitting: Pulmonary Disease

## 2022-01-24 ENCOUNTER — Encounter: Payer: Self-pay | Admitting: Pulmonary Disease

## 2022-01-24 VITALS — BP 142/78 | HR 84 | Temp 98.6°F | Ht 63.0 in | Wt 146.6 lb

## 2022-01-24 DIAGNOSIS — J9611 Chronic respiratory failure with hypoxia: Secondary | ICD-10-CM | POA: Diagnosis not present

## 2022-01-24 NOTE — Progress Notes (Signed)
Subjective:   PATIENT ID: Debbie Mullins GENDER: female DOB: 1956/03/02, MRN: [id number]  Patient of Dr. Loanne Drilling Seeing  today for 70-monthfollow-up  HPI   04/07/21 At our last visit in July 2022, she was prescribed Anoro however ran out awhile ago. She reports using shortness of breath. Last COPD/CHF exacerbation required hospitalization in 04/2019. Denies cough or wheezing. No nocturnal symptoms. Denies any exacerbations since our last visit. She has been using her Albuterol twice a day but this will make her jittery. She like Anoro because she did not have an issues with side effects. She is compliant with her oxygen. She uses her walker and ambulate from one end of the room to the other. She does sitting exercises. She has decreased from 1/2 pdd to 4 cigarettes daily.   07/03/21 Since our last visit she reports well-controlled symptoms on Anoro. Has not needed her albuterol inhaler. Denies shortness of breath, wheezing or chronic cough. No exacerbations for >12 months. She limits her allergy exposure including staying at home during pollen season. Walks daily. Compliant with her oxygen. She is smoking 1/4 pack day  01/24/2022 Reports no significant change in her symptoms Daily use of Anoro Rarely needs to use albuterol Tries to stay active by walking, uses a walker She is on oxygen supplementation that she keeps on around-the-clock Has not had any recent exacerbation requiring an urgent visit  Denies any chest pains or chest discomfort Activity levels maintained  Social History: 41 pack-years. Currently smoking 1/4 ppd.  Past Medical History:  Diagnosis Date   Anemia    Asthma    [place]    takes Furosemide daily    Chronic respiratory failure with hypoxia ([facility] 03/02/2019   COPD (chronic obstructive pulmonary disease) (HCC)    Albuterol inhaler prn;SIngulair at night   Depression    takes Wellbutrin daily   Diabetes mellitus    takes  Metformin daily   GERD (gastroesophageal reflux disease)    takes Nexium daily   History of colon polyps    benign   Hypertension    takes Lisinopril daily   Iron deficiency anemia 08/01/2017   Joint pain    Joint swelling    Leukocytosis 02/09/2011   Neck pain    HNP   Normocytic anemia 02/09/2011   Peripheral edema    takes Furosemide daily   Pneumonia    many yrs ago   Pulmonary nodules/lesions, multiple 08/09/2013   Shortness of breath dyspnea    with exertion   Thrombocytosis 02/09/2011   Urinary frequency    Urinary urgency    Weakness    numbness and tingling in both hands     No Known Allergies   Outpatient Medications Prior to Visit  Medication Sig Dispense Refill   albuterol (VENTOLIN HFA) 108 (90 Base) MCG/ACT inhaler Inhale 1-2 puffs into the lungs every 4 (four) hours as needed for wheezing or shortness of breath. 8 g 5   amLODipine (NORVASC) 5 MG tablet Take 5 mg by mouth daily.     aspirin EC 325 MG tablet Take 325 mg by mouth every morning.      atorvastatin (LIPITOR) 10 MG tablet Take by mouth.     Clobetasol Prop Emollient Base 0.05 % emollient cream Apply 1 application topically 2 (two) times daily as needed (itching).      furosemide (LASIX) 20 MG tablet Take 2 tablets (40 mg total) by mouth daily.     metFORMIN (  GLUCOPHAGE) 500 MG tablet Take 1.5 tablets (750 mg total) by mouth 2 (two) times daily with a meal.     pantoprazole (PROTONIX) 40 MG tablet Take 40 mg by mouth daily.     umeclidinium-vilanterol (ANORO ELLIPTA) 62.5-25 MCG/ACT AEPB Inhale 1 puff into the lungs daily. 1 each 5   ZORVOLEX 35 MG CAPS Take 1 capsule by mouth 3 (three) times daily.     No facility-administered medications prior to visit.    Review of Systems  Constitutional:  Negative for malaise/fatigue and weight loss.  HENT:  Negative for congestion.   Respiratory:  Negative for cough, hemoptysis, sputum production, shortness of breath and wheezing.   Cardiovascular:   Negative for chest pain, palpitations and leg swelling.     Objective:   Vitals:   01/24/22 0910  BP: (!) 142/78  Pulse: 84  Temp: 98.6 F (37 C)  TempSrc: Oral  SpO2: 100%  Weight: 146 lb 9.6 oz (66.5 kg)  Height: '5\' 3"'$  (1.6 m)   SpO2: 100 % (3 lpm oxygen continuous (tank))  Physical Exam: General: Elderly, frail HENT: Moist oral mucosa Eyes: No icterus Respiratory: Clear breath sounds to auscultation, diminished air entry Cardiovascular: S1-S2 appreciated Extremities: No edema, no clubbing Neuro: Alert and oriented x3 Psych: Normal affect  Data Reviewed:  Imaging: CT Chest 01/21/19 - Parenchyma with mosaic attenuation and reticulonodular pattern bilaterally. Stable benign pulmonary nodules  CTA 07/25/19 - No PE. Unchanged mosaic attenulation, mild interlobular septal thickening. Anasarca  CT scan of the chest reviewed with the patient, right upper lobe nodule, mosaic attenuation, some scarring at the bases, atelectasis  PFT: None on file  Ambulatory O2  03/02/19 DME: [employer] Patient Saturations on Room Air at Rest = 84% Patient Saturations on 3 Liters of oxygen while Ambulating = 93% CONCLUSION: --Recommend wearing 2L at rest --Recommend wearing 3L with activity and sleep  07/03/21 Ambulatory O2 - No desaturations on ambulation    Assessment & Plan:   Patient with known history of COPD -No recent exacerbation -Continues on Anoro -Albuterol use as needed  Chronic respiratory failure on oxygen supplementation -Compliant with oxygen use -Encouraged to continue oxygen use  Gold class is unknown as she has not been able to do a PFT She is class C/D - continue Anoro  Abnormal CT scan of the chest -Evidence of interstitial lung disease -We will follow-up with a CT scan -Follow-up on right upper lobe nodule as well  Active smoker -Smoking cessation counseling  Health Maintenance Immunization History  Administered Date(s) Administered   Influenza  Split 01/22/2016   Influenza,inj,Quad PF,6+ Mos 11/18/2014, 01/26/2018, 12/27/2018, 01/03/2020, 01/08/2021   Influenza,trivalent, recombinat, inj, PF 01/06/2017   Influenza-Unspecified 03/12/2014, 12/27/2021   Moderna Sars-Covid-2 Vaccination 04/25/2019, 05/10/2019, 06/13/2019   Pneumococcal Polysaccharide-23 08/12/2013    I spent 30 minutes dedicated to the care of this patient on the date of this encounter to include previsit review of records, face-to-face time with the patient discussing conditions above, post visit ordering of testing, clinical documentation with electronic health record and communicated necessary findings to members of the patient's care team  Sherrilyn Rist, MD [facility] Pager: See Shea Evans

## 2022-01-24 NOTE — Patient Instructions (Signed)
Schedule for CT scan of the chest-high-resolution CT without contrast  Continue using Anoro  Continue working on cutting down on quitting smoking  Regular exercises on a daily basis  Call us with significant concerns  Follow-up with Dr. Loanne Drilling in 6 months

## 2022-01-25 ENCOUNTER — Ambulatory Visit (HOSPITAL_BASED_OUTPATIENT_CLINIC_OR_DEPARTMENT_OTHER): Payer: Medicare Other | Admitting: Pulmonary Disease

## 2022-02-06 ENCOUNTER — Ambulatory Visit (HOSPITAL_COMMUNITY): Payer: Medicare Other

## 2022-02-06 ENCOUNTER — Other Ambulatory Visit (HOSPITAL_COMMUNITY): Payer: Self-pay | Admitting: Family Medicine

## 2022-02-06 DIAGNOSIS — Z1231 Encounter for screening mammogram for malignant neoplasm of breast: Secondary | ICD-10-CM

## 2022-02-08 DIAGNOSIS — J449 Chronic obstructive pulmonary disease, unspecified: Secondary | ICD-10-CM | POA: Diagnosis not present

## 2022-02-08 DIAGNOSIS — I10 Essential (primary) hypertension: Secondary | ICD-10-CM | POA: Diagnosis not present

## 2022-02-08 DIAGNOSIS — E119 Type 2 diabetes mellitus without complications: Secondary | ICD-10-CM | POA: Diagnosis not present

## 2022-02-13 ENCOUNTER — Other Ambulatory Visit: Payer: Self-pay | Admitting: Pulmonary Disease

## 2022-02-19 ENCOUNTER — Ambulatory Visit (HOSPITAL_COMMUNITY)
Admission: RE | Admit: 2022-02-19 | Discharge: 2022-02-19 | Disposition: A | Discharge status: Home / Self Care | Payer: Medicare Other | Source: Ambulatory Visit | Attending: Family Medicine | Admitting: Family Medicine

## 2022-02-19 DIAGNOSIS — Z1231 Encounter for screening mammogram for malignant neoplasm of breast: Secondary | ICD-10-CM | POA: Diagnosis not present

## 2022-02-26 ENCOUNTER — Other Ambulatory Visit (HOSPITAL_COMMUNITY): Payer: Self-pay | Admitting: Family Medicine

## 2022-02-26 DIAGNOSIS — R928 Other abnormal and inconclusive findings on diagnostic imaging of breast: Secondary | ICD-10-CM

## 2022-02-28 ENCOUNTER — Ambulatory Visit (HOSPITAL_COMMUNITY)
Admission: RE | Admit: 2022-02-28 | Discharge: 2022-02-28 | Disposition: A | Discharge status: Home / Self Care | Payer: Medicare Other | Source: Ambulatory Visit | Attending: Family Medicine | Admitting: Family Medicine

## 2022-02-28 DIAGNOSIS — R921 Mammographic calcification found on diagnostic imaging of breast: Secondary | ICD-10-CM | POA: Diagnosis not present

## 2022-02-28 DIAGNOSIS — R928 Other abnormal and inconclusive findings on diagnostic imaging of breast: Secondary | ICD-10-CM | POA: Diagnosis not present

## 2022-03-10 DIAGNOSIS — M1250 Traumatic arthropathy, unspecified site: Secondary | ICD-10-CM | POA: Diagnosis not present

## 2022-03-10 DIAGNOSIS — I10 Essential (primary) hypertension: Secondary | ICD-10-CM | POA: Diagnosis not present

## 2022-03-10 DIAGNOSIS — E119 Type 2 diabetes mellitus without complications: Secondary | ICD-10-CM | POA: Diagnosis not present

## 2022-03-23 ENCOUNTER — Ambulatory Visit
Admission: EM | Admit: 2022-03-23 | Discharge: 2022-03-23 | Disposition: A | Discharge status: Home / Self Care | Payer: 59 | Attending: Family Medicine | Admitting: Family Medicine

## 2022-03-23 ENCOUNTER — Encounter (HOSPITAL_COMMUNITY): Payer: Self-pay | Admitting: Internal Medicine

## 2022-03-23 DIAGNOSIS — R1013 Epigastric pain: Secondary | ICD-10-CM | POA: Diagnosis not present

## 2022-03-23 MED ORDER — LIDOCAINE VISCOUS HCL 2 % MT SOLN
15.0000 mL | Freq: Once | OROMUCOSAL | Status: AC
  Administered 2022-03-23: 15 mL via OROMUCOSAL

## 2022-03-23 MED ORDER — ONDANSETRON 4 MG PO TBDP: 4.0000 mg | ORAL_TABLET | Freq: Three times a day (TID) | ORAL | 0 refills | Status: DC | PRN

## 2022-03-23 MED ORDER — ALUM & MAG HYDROXIDE-SIMETH 200-200-20 MG/5ML PO SUSP
30.0000 mL | Freq: Once | ORAL | Status: AC
  Administered 2022-03-23: 30 mL via ORAL

## 2022-03-23 MED ORDER — SUCRALFATE 1 G PO TABS: 1.0000 g | ORAL_TABLET | Freq: Three times a day (TID) | ORAL | 0 refills | Status: DC

## 2022-03-23 NOTE — ED Triage Notes (Signed)
Pt reports she has an "inflamed ulcer" in her abdomen, diarrhea and nausea x 2 days. States pain is coming mostly on the left side of her abdomen. Took tylenol   States she has had ulcers in the past. Reports no frequent/urgent urination, no low back pain, or blood in urine.

## 2022-03-23 NOTE — ED Provider Notes (Signed)
[facility]    CSN: [id number] Arrival date & time: 03/23/22  0900      History   Chief Complaint Chief Complaint  Patient presents with   Abdominal Pain    HPI Debbie Mullins is a 67 y.o. female.   Patient presenting today with 2-day history of upper abdominal pain sometimes radiating to the left, nausea, occasional diarrhea.  States she has a history of upper GI ulcers in the past and this feels very similar.  She does take Protonix daily, has been taking Mylanta as needed with some relief of her current symptoms.  Denies any new medications, supplements, foods tried, recent travel, sick contacts, fever, chills, body aches, unexpected weight loss, melena, hematochezia, urinary symptoms.    Past Medical History:  Diagnosis Date   Anemia    Asthma    CHF (congestive heart failure) ([place])    takes Furosemide daily    Chronic respiratory failure with hypoxia ([place]) 03/02/2019   COPD (chronic obstructive pulmonary disease) (HCC)    Albuterol inhaler prn;SIngulair at night   Depression    takes Wellbutrin daily   Diabetes mellitus    takes Metformin daily   GERD (gastroesophageal reflux disease)    takes Nexium daily   History of colon polyps    benign   Hypertension    takes Lisinopril daily   Iron deficiency anemia 08/01/2017   Joint pain    Joint swelling    Leukocytosis 02/09/2011   Neck pain    HNP   Normocytic anemia 02/09/2011   Peripheral edema    takes Furosemide daily   Pneumonia    many yrs ago   Pulmonary nodules/lesions, multiple 08/09/2013   Shortness of breath dyspnea    with exertion   Thrombocytosis 02/09/2011   Urinary frequency    Urinary urgency    Weakness    numbness and tingling in both hands    Patient Active Problem List   Diagnosis Date Noted   Physical deconditioning    Pressure injury of skin 07/26/2019   Iron deficiency anemia 08/01/2017   Displaced supracondylar fracture without intracondylar extension of lower  end of right femur, initial encounter for closed fracture ([place]) 05/23/2016   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    Closed fracture of right distal femur (HCC)    Osteoarthritis of spine with myelopathy, cervical region 11/17/2014   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   Pulmonary nodules/lesions, multiple 08/09/2013   HLD (hyperlipidemia) 01/22/2012   Pulmonary hypertension ([place]) 01/08/2012   Chronic diastolic (congestive) heart failure ([place]) 01/05/2012   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 ([place]) 01/02/2012   HTN (hypertension) 01/02/2012   Tobacco abuse 07/04/2011   Valgus deformity knees 05/16/2011   Mild Thrombocytosis 02/09/2011   COPD (chronic obstructive pulmonary disease) ([place]) 02/09/2011   Obesity 02/09/2011    Past Surgical History:  Procedure Laterality Date   ANTERIOR CERVICAL DECOMP/DISCECTOMY FUSION N/A 11/17/2014   Procedure: Cervical four- cervical five Anterior Cervical Decompression with fusion and bonegraft;  Surgeon: Ashok Pall, MD;  Location: [facility];  Service: Neurosurgery;  Laterality: N/A;  C45 anterior cervical decompression with fusion plating and bonegraft   BIOPSY  02/26/2018   Procedure: BIOPSY;  Surgeon: Danie Binder, MD;  Location: [facility];  Service: Endoscopy;;  (colon) Gastric  duodenal   COLONOSCOPY     COLONOSCOPY N/A 02/26/2018   Procedure: COLONOSCOPY;  Surgeon: Danie Binder, MD;  Location: [facility];  Service: Endoscopy;  Laterality: N/A;  9:30am   ESOPHAGOGASTRODUODENOSCOPY N/A 02/26/2018   Procedure: ESOPHAGOGASTRODUODENOSCOPY (EGD);  Surgeon: Danie Binder, MD;  Location: [facility];  Service: Endoscopy;  Laterality: N/A;   FEMUR IM NAIL Right 05/23/2016   FEMUR IM NAIL Right 05/23/2016   Procedure: INTRAMEDULLARY (IM) RETROGRADE FEMORAL NAILING;  Surgeon: [name], MD;  Location: [place];  Service: Orthopedics;  Laterality: Right;   KNEE ARTHROPLASTY Left 08/10/2013    Procedure:  TOTAL KNEE ARTHROPLASTY;  Surgeon: [name], MD;  Location: [place];  Service: Orthopedics;  Laterality: Left;  Left Total Knee Arthroplasty Revision, Cemented, Semi-constrained,    LEFT AND RIGHT HEART CATHETERIZATION WITH CORONARY ANGIOGRAM N/A 01/07/2012   Procedure: LEFT AND RIGHT HEART CATHETERIZATION WITH CORONARY ANGIOGRAM;  Surgeon: Thayer Headings, MD;  Location[facility] C[facility] LAB;  Service: Cardiovascular;  Laterality: N/A;   TONSILLECTOMY     as a child   TOTAL KNEE ARTHROPLASTY Bilateral     OB History   No obstetric history on file.      Home Medications    Prior to Admission medications   Medication Sig Start Date End Date Taking? Authorizing Provider  ondansetron (ZOFRAN-ODT) 4 MG disintegrating tablet Take 1 tablet (4 mg total) by mouth every 8 (eight) hours as needed for nausea or vomiting. 03/23/22  Yes Volney American, PA-C  sucralfate (CARAFATE) 1 g tablet Take 1 tablet (1 g total) by mouth 4 (four) times daily -  with meals and at bedtime. 03/23/22  Yes Volney American, PA-C  albuterol (VENTOLIN HFA) 108 (90 Base) MCG/ACT inhaler Inhale 1-2 puffs into the lungs every 4 (four) hours as needed for wheezing or shortness of breath. 04/07/21   Margaretha Seeds, MD  amLODipine (NORVASC) 5 MG tablet Take 5 mg by mouth daily. 09/13/20   [provider]  Celedonio Miyamoto 62.5-25 MCG/ACT AEPB USE 1 INHALATION BY MOUTH ONCE  DAILY AT [place] 02/13/22   Margaretha Seeds, MD  aspirin EC 325 MG tablet Take 325 mg by mouth every morning.     [provider]  atorvastatin (LIPITOR) 10 MG tablet Take by mouth. 03/04/21   [provider]  Clobetasol Prop Emollient Base 0.05 % emollient cream Apply 1 application topically 2 (two) times daily as needed (itching).  08/22/18   [provider]  furosemide (LASIX) 20 MG tablet Take 2 tablets (40 mg total) by mouth daily. 07/29/19   Barton Dubois, MD  metFORMIN (GLUCOPHAGE) 500  MG tablet Take 1.5 tablets (750 mg total) by mouth 2 (two) times daily with a meal. 01/22/19   Barton Dubois, MD  pantoprazole (PROTONIX) 40 MG tablet Take 40 mg by mouth daily. 11/10/20   [provider]  ZORVOLEX 35 MG CAPS Take 1 capsule by mouth 3 (three) times daily. 03/09/19   [provider]    Family History Family History  Problem Relation Age of Onset   Heart failure Mother    Cancer Maternal Grandmother    Colon cancer Neg Hx    Colon polyps Neg Hx     Social History Social History   Tobacco Use   Smoking status: Every Day    Packs/day: 1.00    Years: 41.00    Total pack years: 41.00    Types: Cigarettes   Smokeless tobacco: Never   Tobacco comments:    Pt states she is smoking less than 0.25 a day  now    pt currently trying to quit with gum/losenges  Vaping Use   Vaping Use: Never used  Substance Use Topics   Alcohol use: Yes    Comment: wine   Drug use: No     Allergies   Patient has no known allergies.   Review of Systems Review of Systems Per HPI  Physical Exam Triage Vital Signs ED Triage Vitals  Enc Vitals Group     BP 03/23/22 0930 (!) 178/72     Pulse Rate 03/23/22 0930 88     Resp 03/23/22 0930 20     Temp 03/23/22 0930 98 F (36.7 C)     Temp Source 03/23/22 0930 Oral     SpO2 03/23/22 0930 93 %     Weight --      Height --      Head Circumference --      Peak Flow --      Pain Score 03/23/22 0936 6     Pain Loc --      Pain Edu? --      Excl. in [place]? --    No data found.  Updated Vital Signs BP (!) 178/72 (BP Location: Right Arm)   Pulse 88   Temp 98 F (36.7 C) (Oral)   Resp 20   LMP 07/03/2011   SpO2 93%   Visual Acuity Right Eye Distance:   Left Eye Distance:   Bilateral Distance:    Right Eye Near:   Left Eye Near:    Bilateral Near:     Physical Exam Vitals and nursing note reviewed.  Constitutional:      Appearance: Normal appearance. She is not ill-appearing.  HENT:     Head:  Atraumatic.     Mouth/Throat:     Mouth: Mucous membranes are moist.     Pharynx: Oropharynx is clear.  Eyes:     Extraocular Movements: Extraocular movements intact.     Conjunctiva/sclera: Conjunctivae normal.  Cardiovascular:     Rate and Rhythm: Normal rate and regular rhythm.     Heart sounds: Normal heart sounds.  Pulmonary:     Effort: Pulmonary effort is normal.     Breath sounds: Normal breath sounds.  Abdominal:     General: Bowel sounds are normal. There is no distension.     Palpations: Abdomen is soft.     Tenderness: There is no abdominal tenderness. There is no guarding.  Musculoskeletal:        General: Normal range of motion.     Cervical back: Normal range of motion and neck supple.  Skin:    General: Skin is warm and dry.  Neurological:     Mental Status: She is alert and oriented to person, place, and time.  Psychiatric:        Mood and Affect: Mood normal.        Thought Content: Thought content normal.        Judgment: Judgment normal.      UC Treatments / Results  Labs (all labs ordered are listed, but only abnormal results are displayed) Labs Reviewed - No data to display  EKG   Radiology No results found.  Procedures Procedures (including critical care time)  Medications Ordered in UC Medications  alum & mag hydroxide-simeth (MAALOX/MYLANTA) 200-200-20 MG/5ML suspension 30 mL (30 mLs Oral Given 03/23/22 0950)  lidocaine (XYLOCAINE) 2 % viscous mouth solution 15 mL (15 mLs Mouth/Throat Given 03/23/22 0948)    Initial Impression /  Assessment and Plan / UC Course  I have reviewed the triage vital signs and the nursing notes.  Pertinent labs & imaging results that were available during my care of the patient were reviewed by me and considered in my medical decision making (see chart for details).     Hypertensive in triage, otherwise vital signs reassuring.  Exam very reassuring with no red flag findings today.  Patient is fairly confident  that this is a flare of her reflux/history of ulcers and declines lab work or other workup at this time.  Will treat with GI cocktail in clinic, Carafate, Zofran in addition to her daily Protonix.  She states she has a follow-up already scheduled with her primary care next week and will use this as a recheck.  Discussed to return sooner if worsening symptoms.  Final Clinical Impressions(s) / UC Diagnoses   Final diagnoses:  Abdominal pain, epigastric     Discharge Instructions      I have sent over 2 medications, 1 is a nausea medication to be used as needed and the other is a medication to help soothe the upper GI tract.  You may dissolve 1 tablet into a glass of water and drink up to 4 times daily prior to meals.  Continue your Protonix daily and avoid any spicy or acidic foods that may cause further irritation    ED Prescriptions     Medication Sig Dispense Auth. Provider   sucralfate (CARAFATE) 1 g tablet Take 1 tablet (1 g total) by mouth 4 (four) times daily -  with meals and at bedtime. 90 tablet Volney American, PA-C   ondansetron (ZOFRAN-ODT) 4 MG disintegrating tablet Take 1 tablet (4 mg total) by mouth every 8 (eight) hours as needed for nausea or vomiting. 20 tablet Volney American, [place]      PDMP not reviewed this encounter.   Merrie Roof [place], [place] 03/23/22 [phone]

## 2022-03-23 NOTE — Discharge Instructions (Signed)
I have sent over 2 medications, 1 is a nausea medication to be used as needed and the other is a medication to help soothe the upper GI tract.  You may dissolve 1 tablet into a glass of water and drink up to 4 times daily prior to meals.  Continue your Protonix daily and avoid any spicy or acidic foods that may cause further irritation

## 2022-03-30 ENCOUNTER — Other Ambulatory Visit: Payer: Self-pay | Admitting: Family Medicine

## 2022-03-30 DIAGNOSIS — R921 Mammographic calcification found on diagnostic imaging of breast: Secondary | ICD-10-CM

## 2022-03-30 DIAGNOSIS — R922 Inconclusive mammogram: Secondary | ICD-10-CM | POA: Diagnosis not present

## 2022-03-30 DIAGNOSIS — Z639 Problem related to primary support group, unspecified: Secondary | ICD-10-CM | POA: Diagnosis not present

## 2022-03-30 DIAGNOSIS — L853 Xerosis cutis: Secondary | ICD-10-CM | POA: Diagnosis not present

## 2022-03-30 DIAGNOSIS — I10 Essential (primary) hypertension: Secondary | ICD-10-CM | POA: Diagnosis not present

## 2022-03-30 DIAGNOSIS — E785 Hyperlipidemia, unspecified: Secondary | ICD-10-CM | POA: Diagnosis not present

## 2022-03-30 DIAGNOSIS — J449 Chronic obstructive pulmonary disease, unspecified: Secondary | ICD-10-CM | POA: Diagnosis not present

## 2022-04-09 ENCOUNTER — Ambulatory Visit
Admission: RE | Admit: 2022-04-09 | Discharge: 2022-04-09 | Disposition: A | Discharge status: Home / Self Care | Payer: 59 | Source: Ambulatory Visit | Attending: Family Medicine | Admitting: Family Medicine

## 2022-04-09 DIAGNOSIS — D241 Benign neoplasm of right breast: Secondary | ICD-10-CM | POA: Diagnosis not present

## 2022-04-09 DIAGNOSIS — R921 Mammographic calcification found on diagnostic imaging of breast: Secondary | ICD-10-CM | POA: Diagnosis not present

## 2022-04-09 HISTORY — PX: BREAST BIOPSY: SHX20

## 2022-04-12 ENCOUNTER — Other Ambulatory Visit: Payer: Self-pay | Admitting: Family Medicine

## 2022-04-12 NOTE — Telephone Encounter (Signed)
Last RF 03/23/22 at [facility] visit with R. Orene Desanctis PA-C  Requested Prescriptions  Refused Prescriptions Disp Refills   sucralfate (CARAFATE) 1 g tablet [Pharmacy Med Name: SUCRALFATE 1GM TABLETS] 368 tablet     Sig: TAKE 1 TABLET(1 GRAM) BY MOUTH FOUR TIMES DAILY WITH MEALS AND AT BEDTIME     There is no refill protocol information for this order

## 2022-05-21 DIAGNOSIS — I10 Essential (primary) hypertension: Secondary | ICD-10-CM | POA: Diagnosis not present

## 2022-05-21 DIAGNOSIS — K219 Gastro-esophageal reflux disease without esophagitis: Secondary | ICD-10-CM | POA: Diagnosis not present

## 2022-05-21 DIAGNOSIS — E785 Hyperlipidemia, unspecified: Secondary | ICD-10-CM | POA: Diagnosis not present

## 2022-05-31 ENCOUNTER — Ambulatory Visit (HOSPITAL_BASED_OUTPATIENT_CLINIC_OR_DEPARTMENT_OTHER): Payer: 59 | Admitting: Pulmonary Disease

## 2022-05-31 ENCOUNTER — Encounter (HOSPITAL_BASED_OUTPATIENT_CLINIC_OR_DEPARTMENT_OTHER): Payer: Self-pay | Admitting: Pulmonary Disease

## 2022-05-31 VITALS — BP 140/60 | HR 95 | Ht 61.0 in | Wt 135.4 lb

## 2022-05-31 DIAGNOSIS — J449 Chronic obstructive pulmonary disease, unspecified: Secondary | ICD-10-CM

## 2022-05-31 DIAGNOSIS — Z720 Tobacco use: Secondary | ICD-10-CM

## 2022-05-31 DIAGNOSIS — J9611 Chronic respiratory failure with hypoxia: Secondary | ICD-10-CM | POA: Diagnosis not present

## 2022-05-31 MED ORDER — ALBUTEROL SULFATE HFA 108 (90 BASE) MCG/ACT IN AERS: 1.0000 | INHALATION_SPRAY | RESPIRATORY_TRACT | 5 refills | Status: AC | PRN

## 2022-05-31 NOTE — Progress Notes (Signed)
Subjective:   PATIENT ID: Debbie Mullins GENDER: female DOB: [birth year], MRN: [id number]   HPI  Chief Complaint  Patient presents with   Follow-up    Breathing doing good, came in on room air o2 97%   Reason for Visit: Follow-up  [name] is a 67 year old female active smoker who presents for COPD follow-up.   04/07/21 At our last visit in July 2022, she was prescribed Anoro however ran out awhile ago. She reports using shortness of breath. Last COPD/CHF exacerbation required hospitalization in 04/2019. Denies cough or wheezing. No nocturnal symptoms. Denies any exacerbations since our last visit. She has been using her Albuterol twice a day but this will make her jittery. She like Anoro because she did not have an issues with side effects. She is compliant with her oxygen. She uses her walker and ambulate from one end of the room to the other. She does sitting exercises. She has decreased from 1/2 pdd to 4 cigarettes daily.   07/03/21 Since our last visit she reports well-controlled symptoms on Anoro. Has not needed her albuterol inhaler. Denies shortness of breath, wheezing or chronic cough. No exacerbations for >12 months. She limits her allergy exposure including staying at home during pollen season. Walks daily. Compliant with her oxygen. She is smoking 1/4 pack day  05/31/22 Since our last visit she has had ulcers and recent weight loss due to liquid diet. She is compliant Anoro. Uses albuterol with activity as needed or with weather changes, on average once a week. Otherwise denies shortness of breath, cough or wheezing. Compliant oxygen. Checks oxygen sometimes on room air and it 83%. She is smoking 2-3 cigarettes a day which is less than before. No exacerbations since our last visit.  Social History: 41 pack-years. Currently smoking 1/4 ppd.  Past Medical History:  Diagnosis Date   Anemia    Asthma    CHF (congestive heart failure) ([place])    takes Furosemide  daily    Chronic respiratory failure with hypoxia ([place]) 03/02/2019   COPD (chronic obstructive pulmonary disease) (HCC)    Albuterol inhaler prn;SIngulair at night   Depression    takes Wellbutrin daily   Diabetes mellitus    takes Metformin daily   GERD (gastroesophageal reflux disease)    takes Nexium daily   History of colon polyps    benign   Hypertension    takes Lisinopril daily   Iron deficiency anemia 08/01/2017   Joint pain    Joint swelling    Leukocytosis 02/09/2011   Neck pain    HNP   Normocytic anemia 02/09/2011   Peripheral edema    takes Furosemide daily   Pneumonia    many yrs ago   Pulmonary nodules/lesions, multiple 08/09/2013   Shortness of breath dyspnea    with exertion   Thrombocytosis 02/09/2011   Urinary frequency    Urinary urgency    Weakness    numbness and tingling in both hands     No Known Allergies   Outpatient Medications Prior to Visit  Medication Sig Dispense Refill   albuterol (VENTOLIN HFA) 108 (90 Base) MCG/ACT inhaler Inhale 1-2 puffs into the lungs every 4 (four) hours as needed for wheezing or shortness of breath. 8 g 5   amLODipine (NORVASC) 5 MG tablet Take 5 mg by mouth daily.     ANORO ELLIPTA 62.5-25 MCG/ACT AEPB USE 1 INHALATION BY MOUTH ONCE  DAILY AT THE SAME TIME EACH DAY 180  each 3   aspirin EC 325 MG tablet Take 325 mg by mouth every morning.      atorvastatin (LIPITOR) 10 MG tablet Take by mouth.     Clobetasol Prop Emollient Base 0.05 % emollient cream Apply 1 application topically 2 (two) times daily as needed (itching).      furosemide (LASIX) 20 MG tablet Take 2 tablets (40 mg total) by mouth daily.     metFORMIN (GLUCOPHAGE) 500 MG tablet Take 1.5 tablets (750 mg total) by mouth 2 (two) times daily with a meal.     ondansetron (ZOFRAN-ODT) 4 MG disintegrating tablet Take 1 tablet (4 mg total) by mouth every 8 (eight) hours as needed for nausea or vomiting. 20 tablet 0   pantoprazole (PROTONIX) 40 MG tablet Take 40  mg by mouth daily.     sucralfate (CARAFATE) 1 g tablet Take 1 tablet (1 g total) by mouth 4 (four) times daily -  with meals and at bedtime. 90 tablet 0   ZORVOLEX 35 MG CAPS Take 1 capsule by mouth 3 (three) times daily.     No facility-administered medications prior to visit.    Review of Systems  Constitutional:  Negative for chills, diaphoresis, fever, malaise/fatigue and weight loss.  HENT:  Negative for congestion.   Respiratory:  Negative for cough, hemoptysis, sputum production, shortness of breath and wheezing.   Cardiovascular:  Negative for chest pain, palpitations and leg swelling.     Objective:   Vitals:   05/31/22 1312  BP: (!) 140/60  Pulse: 95  SpO2: 97%  Weight: 135 lb 6.4 oz (61.4 kg)  Height: 5\' 1"  (1.549 m)   SpO2: 97 % (room air) O2 Device: None (Room air)  Physical Exam: General: Well-appearing, no acute distress HENT: [place], AT Eyes: EOMI, no scleral icterus Respiratory: Clear to auscultation bilaterally.  No crackles, wheezing or rales Cardiovascular: RRR, -M/R/G, no JVD Extremities:-Edema,-tenderness Neuro: AAO x4, CNII-XII grossly intact Psych: Normal mood, normal affect   Data Reviewed:  Imaging: CT Chest 01/21/19 - Parenchyma with mosaic attenuation and reticulonodular pattern bilaterally. Stable benign pulmonary nodules  CTA 07/25/19 - No PE. Unchanged mosaic attenulation, mild interlobular septal thickening. Anasarca  PFT: None on file     Assessment & Plan:   Discussion: 67 year old female active smoker who presents for follow-up. Last exacerbation >2 years. Well controlled with weekly SABA use. Compliant with LAMA/LABA and oxygen. Discussed clinical course and management of COPD including bronchodilator regimen, preventive care including vaccinations and action plan for exacerbation.  COPD, unknown severity GOLD Class C/D --CONTINUE Anoro ONE puff ONCE a day. This is your EVERYDAY inhaler. --CONTINUE Albuterol as needed. This is  your RESCUE inhaler to use as needed for shortness of breath or wheezing --Enroll in CT lung screening  Chronic hypoxemic respiratory failure --Last ambulatory O2 01/24/22 --CONTINUE supplemental oxygen if <88%  Tobacco abuse Patient is an active smoker. Cutting back on her own time We discussed smoking cessation for <3 minutes. We discussed triggers and stressors and ways to deal with them. We discussed barriers to continued smoking and benefits of smoking cessation. Provided patient with information cessation techniques   Health Maintenance Immunization History  Administered Date(s) Administered   Influenza Split 01/22/2016   Influenza,inj,Quad PF,6+ Mos 11/18/2014, 01/26/2018, 12/27/2018, 01/03/2020, 01/08/2021   Influenza,trivalent, recombinat, inj, PF 01/06/2017   Influenza-Unspecified 03/12/2014, 12/27/2021   Moderna Sars-Covid-2 Vaccination 04/25/2019, 05/10/2019, 06/13/2019   Pneumococcal Polysaccharide-23 08/12/2013   CT Lung Screen - Enrolling this visit  Orders Placed This Encounter  Procedures   Ambulatory Referral for Lung Cancer Scre    Referral Priority:   Routine    Referral Type:   Consultation    Referral Reason:   Specialty Services Required    Number of Visits Requested:   1   Meds ordered this encounter  Medications   albuterol (VENTOLIN HFA) 108 (90 Base) MCG/ACT inhaler    Sig: Inhale 1-2 puffs into the lungs every 4 (four) hours as needed for wheezing or shortness of breath.    Dispense:  8 g    Refill:  5    Return in about 5 months (around 10/31/2022) for with Dr. Loanne Drilling, after CT scan.  I have spent a total time of 33-minutes on the day of the appointment including chart review, data review, collecting history, coordinating care and discussing medical diagnosis and plan with the patient/family. Past medical history, allergies, medications were reviewed. Pertinent imaging, labs and tests included in this note have been reviewed and interpreted  independently by me.  [place], MD [facility] 05/31/2022 1:30 PM  Office Number [phone]

## 2022-05-31 NOTE — Patient Instructions (Signed)
COPD, unknown severity GOLD Class C/D --CONTINUE Anoro ONE puff ONCE a day. This is your EVERYDAY inhaler. --CONTINUE Albuterol as needed. This is your RESCUE inhaler to use as needed for shortness of breath or wheezing --Enroll in CT lung screening  Chronic hypoxemic respiratory failure --Last ambulatory O2 01/24/22 --CONTINUE supplemental oxygen if <88%  Tobacco abuse Patient is an active smoker. Cutting back on her own time We discussed smoking cessation for <3 minutes. We discussed triggers and stressors and ways to deal with them. We discussed barriers to continued smoking and benefits of smoking cessation.

## 2022-06-06 ENCOUNTER — Encounter (HOSPITAL_BASED_OUTPATIENT_CLINIC_OR_DEPARTMENT_OTHER): Payer: Self-pay | Admitting: Pulmonary Disease

## 2022-08-17 ENCOUNTER — Other Ambulatory Visit (HOSPITAL_COMMUNITY): Payer: Self-pay

## 2022-08-17 ENCOUNTER — Encounter (HOSPITAL_COMMUNITY): Payer: Self-pay | Admitting: Internal Medicine

## 2022-08-24 ENCOUNTER — Other Ambulatory Visit: Payer: Self-pay | Admitting: *Deleted

## 2022-08-24 DIAGNOSIS — Z87891 Personal history of nicotine dependence: Secondary | ICD-10-CM

## 2022-08-24 DIAGNOSIS — Z122 Encounter for screening for malignant neoplasm of respiratory organs: Secondary | ICD-10-CM

## 2022-08-24 DIAGNOSIS — F17210 Nicotine dependence, cigarettes, uncomplicated: Secondary | ICD-10-CM

## 2022-09-24 DIAGNOSIS — E559 Vitamin D deficiency, unspecified: Secondary | ICD-10-CM | POA: Diagnosis not present

## 2022-09-24 DIAGNOSIS — R634 Abnormal weight loss: Secondary | ICD-10-CM | POA: Diagnosis not present

## 2022-09-24 DIAGNOSIS — I10 Essential (primary) hypertension: Secondary | ICD-10-CM | POA: Diagnosis not present

## 2022-09-24 DIAGNOSIS — J441 Chronic obstructive pulmonary disease with (acute) exacerbation: Secondary | ICD-10-CM | POA: Diagnosis not present

## 2022-09-24 DIAGNOSIS — E1169 Type 2 diabetes mellitus with other specified complication: Secondary | ICD-10-CM | POA: Diagnosis not present

## 2022-09-25 ENCOUNTER — Encounter: Payer: Self-pay | Admitting: Physician Assistant

## 2022-09-25 ENCOUNTER — Ambulatory Visit (INDEPENDENT_AMBULATORY_CARE_PROVIDER_SITE_OTHER): Payer: 59 | Admitting: Physician Assistant

## 2022-09-25 DIAGNOSIS — F17210 Nicotine dependence, cigarettes, uncomplicated: Secondary | ICD-10-CM

## 2022-09-25 NOTE — Progress Notes (Signed)
Virtual Visit via Telephone Note  I connected with Debbie Mullins on 09/25/22 at  9:53 AM by telephone and verified that I am speaking with the correct person using two identifiers.  Location: Patient: home Provider: working virtually from home   I discussed the limitations, risks, security and privacy concerns of performing an evaluation and management service by telephone and the availability of in person appointments. I also discussed with the patient that there may be a patient responsible charge related to this service. The patient expressed understanding and agreed to proceed.     Shared Decision Making Visit Lung Cancer Screening Program [phone])   Eligibility: Age 67 Pack Years Smoking History Calculation 25 (# packs/per year x # years smoked) Recent History of coughing up blood  No Unexplained weight loss? No ( >Than 15 pounds within the last 6 months ) Prior History Lung / other cancer No (Diagnosis within the last 5 years already requiring surveillance chest CT Scans). Smoking Status Current Smoker  Visit Components: Discussion included one or more decision making aids. Yes Discussion included risk/benefits of screening. Yes Discussion included potential follow up diagnostic testing for abnormal scans. Yes Discussion included meaning and risk of over diagnosis. Yes Discussion included meaning and risk of False Positives. Yes Discussion included meaning of total radiation exposure. Yes  Counseling Included: Importance of adherence to annual lung cancer LDCT screening. Yes Impact of comorbidities on ability to participate in the program. Yes Ability and willingness to under diagnostic treatment: Yes  Smoking Cessation Counseling: Current Smokers:  Discussed importance of smoking cessation. Yes Information about tobacco cessation classes and interventions provided to patient. Yes Symptomatic Patient. No Diagnosis Code: Tobacco Use Z72.0 Asymptomatic Patient  Yes  Counseling (Intermediate counseling: > three minutes counseling) [id number] Information about tobacco cessation classes and interventions provided to patient. Yes Written Order for Lung Cancer Screening with LDCT placed in Epic. Yes (CT Chest Lung Cancer Screening Low Dose W/O CM) XLK4401 Z12.2-Screening of respiratory organs Z87.891-Personal history of nicotine dependence   I have spent 25 minutes of face to face/ virtual visit  time with the patient discussing the risks and benefits of lung cancer screening. We discussed the above noted topics. We paused at intervals to allow for questions to be asked and answered to ensure understanding.We discussed that the single most powerful action that anyone can take to decrease their risk of developing lung cancer is to quit smoking.  We discussed options for tools to aid in quitting smoking including nicotine replacement therapy, non-nicotine medications, support groups, Quit Smart classes, and behavior modification. We discussed that often times setting smaller, more achievable goals, such as eliminating 1 cigarette a day for a week and then 2 cigarettes a day for a week can be helpful in slowly decreasing the number of cigarettes smoked. I provided  them  with smoking cessation  information  with contact information for community resources, classes, free nicotine replacement therapy, and access to mobile apps, text messaging, and on-line smoking cessation help. I have also provided  them  the office contact information in the event they have any questions. We discussed the time and location of the scan, and that either Abigail Miyamoto RN, Karlton Lemon, RN  or I will call / send a letter with the results within 24-72 hours of receiving them. The patient verbalized understanding of all of  the above and had no further questions upon leaving the office. They have my contact information in the event they have any further  questions.  I spent 3 minutes counseling  on smoking cessation and the health risks of continued tobacco abuse.  I explained to the patient that there has been a high incidence of coronary artery disease noted on these exams. I explained that this is a non-gated exam therefore degree or severity cannot be determined. This patient is on statin therapy. I have asked the patient to follow-up with their PCP regarding any incidental finding of coronary artery disease and management with diet or medication as their PCP  feels is clinically indicated. The patient verbalized understanding of the above and had no further questions upon completion of the visit.    Debbie Mullins Debbie Fisher, PA-C

## 2022-09-25 NOTE — Patient Instructions (Signed)

## 2022-09-26 ENCOUNTER — Ambulatory Visit (HOSPITAL_COMMUNITY)
Admission: RE | Admit: 2022-09-26 | Discharge: 2022-09-26 | Disposition: A | Discharge status: Home / Self Care | Payer: 59 | Source: Ambulatory Visit | Attending: Acute Care | Admitting: Acute Care

## 2022-09-26 DIAGNOSIS — Z122 Encounter for screening for malignant neoplasm of respiratory organs: Secondary | ICD-10-CM | POA: Insufficient documentation

## 2022-09-26 DIAGNOSIS — Z87891 Personal history of nicotine dependence: Secondary | ICD-10-CM | POA: Insufficient documentation

## 2022-09-26 DIAGNOSIS — F17210 Nicotine dependence, cigarettes, uncomplicated: Secondary | ICD-10-CM | POA: Diagnosis not present

## 2022-10-02 ENCOUNTER — Other Ambulatory Visit: Payer: Self-pay | Admitting: Acute Care

## 2022-10-02 DIAGNOSIS — Z122 Encounter for screening for malignant neoplasm of respiratory organs: Secondary | ICD-10-CM

## 2022-10-02 DIAGNOSIS — F17210 Nicotine dependence, cigarettes, uncomplicated: Secondary | ICD-10-CM

## 2022-10-02 DIAGNOSIS — Z87891 Personal history of nicotine dependence: Secondary | ICD-10-CM

## 2022-11-05 DIAGNOSIS — D649 Anemia, unspecified: Secondary | ICD-10-CM | POA: Diagnosis not present

## 2022-11-05 DIAGNOSIS — E1169 Type 2 diabetes mellitus with other specified complication: Secondary | ICD-10-CM | POA: Diagnosis not present

## 2022-11-05 DIAGNOSIS — I10 Essential (primary) hypertension: Secondary | ICD-10-CM | POA: Diagnosis not present

## 2022-11-05 DIAGNOSIS — J449 Chronic obstructive pulmonary disease, unspecified: Secondary | ICD-10-CM | POA: Diagnosis not present

## 2022-12-17 DIAGNOSIS — Z0000 Encounter for general adult medical examination without abnormal findings: Secondary | ICD-10-CM | POA: Diagnosis not present

## 2022-12-17 DIAGNOSIS — I10 Essential (primary) hypertension: Secondary | ICD-10-CM | POA: Diagnosis not present

## 2022-12-17 DIAGNOSIS — Z9981 Dependence on supplemental oxygen: Secondary | ICD-10-CM | POA: Diagnosis not present

## 2022-12-17 DIAGNOSIS — R0902 Hypoxemia: Secondary | ICD-10-CM | POA: Diagnosis not present

## 2022-12-17 DIAGNOSIS — E785 Hyperlipidemia, unspecified: Secondary | ICD-10-CM | POA: Diagnosis not present

## 2022-12-17 DIAGNOSIS — Z23 Encounter for immunization: Secondary | ICD-10-CM | POA: Diagnosis not present

## 2022-12-19 ENCOUNTER — Encounter (HOSPITAL_BASED_OUTPATIENT_CLINIC_OR_DEPARTMENT_OTHER): Payer: Self-pay | Admitting: Pulmonary Disease

## 2022-12-19 ENCOUNTER — Ambulatory Visit (HOSPITAL_BASED_OUTPATIENT_CLINIC_OR_DEPARTMENT_OTHER): Payer: 59 | Admitting: Pulmonary Disease

## 2022-12-19 VITALS — BP 138/68 | HR 85 | Resp 16 | Ht 61.0 in | Wt 133.5 lb

## 2022-12-19 DIAGNOSIS — J432 Centrilobular emphysema: Secondary | ICD-10-CM

## 2022-12-19 DIAGNOSIS — J449 Chronic obstructive pulmonary disease, unspecified: Secondary | ICD-10-CM

## 2022-12-19 MED ORDER — ANORO ELLIPTA 62.5-25 MCG/ACT IN AEPB: 1.0000 | INHALATION_SPRAY | Freq: Every day | RESPIRATORY_TRACT | 3 refills | Status: DC

## 2022-12-19 NOTE — Patient Instructions (Signed)
COPD, unknown severity GOLD Class C/D --CONTINUE Anoro ONE puff ONCE a day. This is your EVERYDAY inhaler. --CONTINUE Albuterol as needed. This is your RESCUE inhaler to use as needed for shortness of breath or wheezing  Chronic hypoxemic respiratory failure --Last ambulatory O2 01/24/22 --CONTINUE supplemental oxygen if <88%

## 2022-12-19 NOTE — Progress Notes (Unsigned)
Subjective:   PATIENT ID: Debbie Mullins GENDER: female DOB: 07-Aug-1955, MRN: [id number]   HPI  No chief complaint on file.  Reason for Visit: Follow-up  [name] is a 67 year old female active smoker who presents for COPD follow-up.   04/07/21 At our last visit in July 2022, she was prescribed Anoro however ran out awhile ago. She reports using shortness of breath. Last COPD/CHF exacerbation required hospitalization in 04/2019. Denies cough or wheezing. No nocturnal symptoms. Denies any exacerbations since our last visit. She has been using her Albuterol twice a day but this will make her jittery. She like Anoro because she did not have an issues with side effects. She is compliant with her oxygen. She uses her walker and ambulate from one end of the room to the other. She does sitting exercises. She has decreased from 1/2 pdd to 4 cigarettes daily.   07/03/21 Since our last visit she reports well-controlled symptoms on Anoro. Has not needed her albuterol inhaler. Denies shortness of breath, wheezing or chronic cough. No exacerbations for >12 months. She limits her allergy exposure including staying at home during pollen season. Walks daily. Compliant with her oxygen. She is smoking 1/4 pack day  05/31/22 Since our last visit she has had ulcers and recent weight loss due to liquid diet. She is compliant Anoro. Uses albuterol with activity as needed or with weather changes, on average once a week. Otherwise denies shortness of breath, cough or wheezing. Compliant oxygen. Checks oxygen sometimes on room air and it 83%. She is smoking 2-3 cigarettes a day which is less than before. No exacerbations since our last visit.  12/19/22 She reports she is overall doing well. Compliant with Anoro. Denies shortness of breath cough or wheezing. Denies exacerbations. Has been checking oxygen with lowest at 90%. Wearing oxygen as needed. Still smoking 4-5 cigarettes a day still.  Social  History: 41 pack-years. Currently smoking 1/4 ppd.  Past Medical History:  Diagnosis Date   Anemia    Asthma    CHF (congestive heart failure) (HCC)    takes Furosemide daily    Chronic respiratory failure with hypoxia (HCC) 03/02/2019   COPD (chronic obstructive pulmonary disease) (HCC)    Albuterol inhaler prn;SIngulair at night   Depression    takes Wellbutrin daily   Diabetes mellitus    takes Metformin daily   GERD (gastroesophageal reflux disease)    takes Nexium daily   History of colon polyps    benign   Hypertension    takes Lisinopril daily   Iron deficiency anemia 08/01/2017   Joint pain    Joint swelling    Leukocytosis 02/09/2011   Neck pain    HNP   Normocytic anemia 02/09/2011   Peripheral edema    takes Furosemide daily   Pneumonia    many yrs ago   Pulmonary nodules/lesions, multiple 08/09/2013   Shortness of breath dyspnea    with exertion   Thrombocytosis 02/09/2011   Urinary frequency    Urinary urgency    Weakness    numbness and tingling in both hands     No Known Allergies   Outpatient Medications Prior to Visit  Medication Sig Dispense Refill   albuterol (VENTOLIN HFA) 108 (90 Base) MCG/ACT inhaler Inhale 1-2 puffs into the lungs every 4 (four) hours as needed for wheezing or shortness of breath. 8 g 5   amLODipine (NORVASC) 5 MG tablet Take 5 mg by mouth daily.  aspirin EC 325 MG tablet Take 325 mg by mouth every morning.      atorvastatin (LIPITOR) 10 MG tablet Take by mouth.     Clobetasol Prop Emollient Base 0.05 % emollient cream Apply 1 application topically 2 (two) times daily as needed (itching).      metFORMIN (GLUCOPHAGE) 500 MG tablet Take 1.5 tablets (750 mg total) by mouth 2 (two) times daily with a meal.     ondansetron (ZOFRAN-ODT) 4 MG disintegrating tablet Take 1 tablet (4 mg total) by mouth every 8 (eight) hours as needed for nausea or vomiting. 20 tablet 0   pantoprazole (PROTONIX) 40 MG tablet Take 40 mg by mouth  daily.     sucralfate (CARAFATE) 1 g tablet Take 1 tablet (1 g total) by mouth 4 (four) times daily -  with meals and at bedtime. 90 tablet 0   ZORVOLEX 35 MG CAPS Take 1 capsule by mouth 3 (three) times daily.     ANORO ELLIPTA 62.5-25 MCG/ACT AEPB USE 1 INHALATION BY MOUTH ONCE  DAILY AT THE SAME TIME EACH DAY 180 each 3   furosemide (LASIX) 20 MG tablet Take 2 tablets (40 mg total) by mouth daily.     No facility-administered medications prior to visit.    Review of Systems  Constitutional:  Negative for chills, diaphoresis, fever, malaise/fatigue and weight loss.  HENT:  Negative for congestion.   Respiratory:  Negative for cough, hemoptysis, sputum production, shortness of breath and wheezing.   Cardiovascular:  Negative for chest pain, palpitations and leg swelling.     Objective:   Vitals:   12/19/22 1109  BP: 138/68  Pulse: 85  Resp: 16  SpO2: 98%  Weight: 133 lb 8 oz (60.6 kg)  Height: 5\' 1"  (1.549 m)   SpO2: 98 %  Physical Exam: General: Well-appearing, no acute distress HENT: [place], AT Eyes: EOMI, no scleral icterus Respiratory: ***Clear to auscultation bilaterally.  No crackles, wheezing or rales Cardiovascular: RRR, -M/R/G, no JVD Extremities:-Edema,-tenderness Neuro: AAO x4, CNII-XII grossly intact Psych: Normal mood, normal affect   Data Reviewed:  Imaging: CT Chest 01/21/19 - Parenchyma with mosaic attenuation and reticulonodular pattern bilaterally. Stable benign pulmonary nodules  CTA 07/25/19 - No PE. Unchanged mosaic attenulation, mild interlobular septal thickening. Anasarca CT Chest lung screen 09/26/22 - multiple small lung nodules bilaterally with largest RLL 5.7 mm. Mild centrilobular and paraseptal emphysema  PFT: None on file     Assessment & Plan:   Discussion: 67 year old female active smoker who presents for follow-up. Last exacerbation >2 years. Well controlled with weekly SABA use. Compliant with LAMA/LABA and oxygen. Discussed  clinical course and management of COPD including bronchodilator regimen, preventive care including vaccinations and action plan for exacerbation.  COPD with emphysema GOLD Class C/D --CONTINUE Anoro ONE puff ONCE a day. This is your EVERYDAY inhaler. --CONTINUE Albuterol as needed. This is your RESCUE inhaler to use as needed for shortness of breath or wheezing  Chronic hypoxemic respiratory failure --Last ambulatory O2 01/24/22 --CONTINUE supplemental oxygen if <88%  Tobacco abuse Patient is an active smoker. Trying but difficult because she lives with smokers We discussed smoking cessation for <3 minutes. We discussed triggers and stressors and ways to deal with them. We discussed barriers to continued smoking and benefits of smoking cessation. Provided patient with information cessation techniques and interventions  Health Maintenance Immunization History  Administered Date(s) Administered   Influenza Split 01/22/2016   Influenza,inj,Quad PF,6+ Mos 11/18/2014, 01/26/2018, 12/27/2018, 01/03/2020, 01/08/2021  Influenza,trivalent, recombinat, inj, PF 01/06/2017   Influenza-Unspecified 03/12/2014, 12/27/2021, 12/17/2022   Moderna Sars-Covid-2 Vaccination 04/25/2019, 05/10/2019, 06/13/2019   Pneumococcal Polysaccharide-23 08/12/2013   CT Lung Screen - Enrolled. Due 09/2023  No orders of the defined types were placed in this encounter.  Meds ordered this encounter  Medications   ANORO ELLIPTA 62.5-25 MCG/ACT AEPB    Sig: Inhale 1 puff into the lungs daily.    Dispense:  180 each    Refill:  3    Please send a replace/new response with 100-Day Supply if appropriate to maximize member benefit. Requesting 1 year supply.    Return in about 6 months (around 06/19/2023).  I have spent a total time of 35-minutes on the day of the appointment including chart review, data review, collecting history, coordinating care and discussing medical diagnosis and plan with the patient/family. Past  medical history, allergies, medications were reviewed. Pertinent imaging, labs and tests included in this note have been reviewed and interpreted independently by me.  Sunya Humbarger Mechele Collin, MD [facility] 12/19/2022 11:32 AM  Office Number [phone]

## 2023-01-17 DIAGNOSIS — M8588 Other specified disorders of bone density and structure, other site: Secondary | ICD-10-CM | POA: Diagnosis not present

## 2023-01-30 ENCOUNTER — Other Ambulatory Visit: Payer: Self-pay | Admitting: Family Medicine

## 2023-01-30 DIAGNOSIS — Z1231 Encounter for screening mammogram for malignant neoplasm of breast: Secondary | ICD-10-CM

## 2023-03-08 ENCOUNTER — Ambulatory Visit
Admission: RE | Admit: 2023-03-08 | Discharge: 2023-03-08 | Disposition: A | Discharge status: Home / Self Care | Payer: 59 | Source: Ambulatory Visit | Attending: Family Medicine | Admitting: Family Medicine

## 2023-03-08 DIAGNOSIS — Z1231 Encounter for screening mammogram for malignant neoplasm of breast: Secondary | ICD-10-CM

## 2023-03-11 DIAGNOSIS — E1169 Type 2 diabetes mellitus with other specified complication: Secondary | ICD-10-CM | POA: Diagnosis not present

## 2023-03-11 DIAGNOSIS — T8130XS Disruption of wound, unspecified, sequela: Secondary | ICD-10-CM | POA: Diagnosis not present

## 2023-03-11 DIAGNOSIS — R609 Edema, unspecified: Secondary | ICD-10-CM | POA: Diagnosis not present

## 2023-03-11 DIAGNOSIS — I10 Essential (primary) hypertension: Secondary | ICD-10-CM | POA: Diagnosis not present

## 2023-04-05 DIAGNOSIS — I10 Essential (primary) hypertension: Secondary | ICD-10-CM | POA: Diagnosis not present

## 2023-04-05 DIAGNOSIS — E1169 Type 2 diabetes mellitus with other specified complication: Secondary | ICD-10-CM | POA: Diagnosis not present

## 2023-04-05 DIAGNOSIS — L720 Epidermal cyst: Secondary | ICD-10-CM | POA: Diagnosis not present

## 2023-04-05 DIAGNOSIS — J441 Chronic obstructive pulmonary disease with (acute) exacerbation: Secondary | ICD-10-CM | POA: Diagnosis not present

## 2023-04-30 DIAGNOSIS — E119 Type 2 diabetes mellitus without complications: Secondary | ICD-10-CM | POA: Diagnosis not present

## 2023-05-16 ENCOUNTER — Emergency Department (HOSPITAL_COMMUNITY)

## 2023-05-16 ENCOUNTER — Encounter (HOSPITAL_COMMUNITY): Payer: Self-pay

## 2023-05-16 ENCOUNTER — Other Ambulatory Visit: Payer: Self-pay

## 2023-05-16 ENCOUNTER — Emergency Department (HOSPITAL_COMMUNITY): Admission: EM | Admit: 2023-05-16 | Discharge: 2023-05-17 | Disposition: A | Discharge status: Home / Self Care

## 2023-05-16 DIAGNOSIS — M7989 Other specified soft tissue disorders: Secondary | ICD-10-CM | POA: Diagnosis not present

## 2023-05-16 DIAGNOSIS — R911 Solitary pulmonary nodule: Secondary | ICD-10-CM | POA: Diagnosis not present

## 2023-05-16 DIAGNOSIS — I509 Heart failure, unspecified: Secondary | ICD-10-CM | POA: Insufficient documentation

## 2023-05-16 DIAGNOSIS — M79604 Pain in right leg: Secondary | ICD-10-CM | POA: Diagnosis not present

## 2023-05-16 DIAGNOSIS — Z79899 Other long term (current) drug therapy: Secondary | ICD-10-CM | POA: Diagnosis not present

## 2023-05-16 DIAGNOSIS — I10 Essential (primary) hypertension: Secondary | ICD-10-CM | POA: Diagnosis not present

## 2023-05-16 DIAGNOSIS — J9811 Atelectasis: Secondary | ICD-10-CM | POA: Diagnosis not present

## 2023-05-16 DIAGNOSIS — Z7982 Long term (current) use of aspirin: Secondary | ICD-10-CM | POA: Diagnosis not present

## 2023-05-16 DIAGNOSIS — R0602 Shortness of breath: Secondary | ICD-10-CM | POA: Diagnosis not present

## 2023-05-16 DIAGNOSIS — I110 Hypertensive heart disease with heart failure: Secondary | ICD-10-CM | POA: Insufficient documentation

## 2023-05-16 DIAGNOSIS — L03115 Cellulitis of right lower limb: Secondary | ICD-10-CM | POA: Insufficient documentation

## 2023-05-16 DIAGNOSIS — E871 Hypo-osmolality and hyponatremia: Secondary | ICD-10-CM | POA: Diagnosis not present

## 2023-05-16 DIAGNOSIS — E119 Type 2 diabetes mellitus without complications: Secondary | ICD-10-CM | POA: Diagnosis not present

## 2023-05-16 DIAGNOSIS — R918 Other nonspecific abnormal finding of lung field: Secondary | ICD-10-CM | POA: Diagnosis not present

## 2023-05-16 DIAGNOSIS — R2241 Localized swelling, mass and lump, right lower limb: Secondary | ICD-10-CM | POA: Diagnosis present

## 2023-05-16 DIAGNOSIS — R600 Localized edema: Secondary | ICD-10-CM

## 2023-05-16 DIAGNOSIS — K8020 Calculus of gallbladder without cholecystitis without obstruction: Secondary | ICD-10-CM | POA: Diagnosis not present

## 2023-05-16 DIAGNOSIS — Z7984 Long term (current) use of oral hypoglycemic drugs: Secondary | ICD-10-CM | POA: Insufficient documentation

## 2023-05-16 LAB — BASIC METABOLIC PANEL
Anion gap: 11 (ref 5–15)
BUN: 10 mg/dL (ref 8–23)
CO2: 23 mmol/L (ref 22–32)
Calcium: 8.3 mg/dL — ABNORMAL LOW (ref 8.9–10.3)
Chloride: 94 mmol/L — ABNORMAL LOW (ref 98–111)
Creatinine, Ser: 0.71 mg/dL (ref 0.44–1.00)
GFR, Estimated: >60 mL/min (ref >60)
Glucose, Bld: 72 mg/dL (ref 70–99)
Potassium: 4.1 mmol/L (ref 3.5–5.1)
Sodium: 128 mmol/L — ABNORMAL LOW (ref 135–145)

## 2023-05-16 LAB — CBC
HCT: 29.3 % — ABNORMAL LOW (ref 36.0–46.0)
Hemoglobin: 9.8 g/dL — ABNORMAL LOW (ref 12.0–15.0)
MCH: 30 pg (ref 26.0–34.0)
MCHC: 33.4 g/dL (ref 30.0–36.0)
MCV: 89.6 fL (ref 80.0–100.0)
Platelets: 445 10*3/uL — ABNORMAL HIGH (ref 150–400)
RBC: 3.27 MIL/uL — ABNORMAL LOW (ref 3.87–5.11)
RDW: 15.6 % — ABNORMAL HIGH (ref 11.5–15.5)
WBC: 8.6 10*3/uL (ref 4.0–10.5)
nRBC: 0 % (ref 0.0–0.2)

## 2023-05-16 NOTE — ED Provider Notes (Signed)
 [place] EMERGENCY DEPARTMENT AT [facility] Provider Note   CSN: [id number] Arrival date & time: 05/16/23  1808     History  Chief Complaint  Patient presents with   Leg Swelling    Debbie Mullins is a 68 y.o. female.  69 year old female with past medical history of hypertension, diabetes, and CHF presenting to the emergency department today with concern for lower extremity swelling.  The patient was sent in primarily by her doctor for an ultrasound of her right lower extremity.  The patient states that she is having some mild swelling of both extremities but is more so on the right.  She does have a chronic ulcer noted over her right lower leg but she states that this is similar in appearance to what it is look like over the past few months.  She denies any fevers or chills.  She does report some mild dyspnea on exertion.  Denies any chest pain.  She denies any history of DVT or pulmonary embolism, recent surgeries, recent travel.        Home Medications Prior to Admission medications   Medication Sig Start Date End Date Taking? Authorizing Provider  albuterol (VENTOLIN HFA) 108 (90 Base) MCG/ACT inhaler Inhale 1-2 puffs into the lungs every 4 (four) hours as needed for wheezing or shortness of breath. 05/31/22   Luciano Cutter, MD  amLODipine (NORVASC) 5 MG tablet Take 5 mg by mouth daily. 09/13/20   [provider]  Ernestina Patches 62.5-25 MCG/ACT AEPB Inhale 1 puff into the lungs daily. 12/19/22   Luciano Cutter, MD  aspirin EC 325 MG tablet Take 325 mg by mouth every morning.     [provider]  atorvastatin (LIPITOR) 10 MG tablet Take by mouth. 03/04/21   [provider]  Clobetasol Prop Emollient Base 0.05 % emollient cream Apply 1 application topically 2 (two) times daily as needed (itching).  08/22/18   [provider]  metFORMIN (GLUCOPHAGE) 500 MG tablet Take 1.5 tablets (750 mg total) by mouth 2 (two) times daily with a  meal. 01/22/19   Vassie Loll, MD  ondansetron (ZOFRAN-ODT) 4 MG disintegrating tablet Take 1 tablet (4 mg total) by mouth every 8 (eight) hours as needed for nausea or vomiting. 03/23/22   Particia Nearing, PA-C  pantoprazole (PROTONIX) 40 MG tablet Take 40 mg by mouth daily. 11/10/20   [provider]  sucralfate (CARAFATE) 1 g tablet Take 1 tablet (1 g total) by mouth 4 (four) times daily -  with meals and at bedtime. 03/23/22   Particia Nearing, PA-C  ZORVOLEX 35 MG CAPS Take 1 capsule by mouth 3 (three) times daily. 03/09/19   [provider]      Allergies    Patient has no known allergies.    Review of Systems   Review of Systems  Respiratory:  Positive for shortness of breath.   Cardiovascular:  Positive for leg swelling. Negative for chest pain.  All other systems reviewed and are negative.   Physical Exam Updated Vital Signs BP (!) 146/58 (BP Location: Left Arm)   Pulse 88   Temp 98.1 F (36.7 C) (Oral)   Resp (!) 23   LMP 07/03/2011   SpO2 94%  Physical Exam Vitals and nursing note reviewed.   Gen: NAD Eyes: PERRL, EOMI HEENT: no oropharyngeal swelling Neck: trachea midline Resp: Diminished at bilateral lung bases Card: RRR, no murmurs, rubs, or gallops Abd: nontender, nondistended Extremities: no calf  tenderness, patient does have trace edema in the bilateral lower extremities that may be slightly worse on the right than the left Vascular: Palpable DP and PT pulses bilaterally Skin: no rashes Psyc: acting appropriately   ED Results / Procedures / Treatments   Labs (all labs ordered are listed, but only abnormal results are displayed) Labs Reviewed  BASIC METABOLIC PANEL - Abnormal; Notable for the following components:      Result Value   Sodium 128 (*)    Chloride 94 (*)    Calcium 8.3 (*)    All other components within normal limits  CBC - Abnormal; Notable for the following components:   RBC 3.27 (*)    Hemoglobin 9.8  (*)    HCT 29.3 (*)    RDW 15.6 (*)    Platelets 445 (*)    All other components within normal limits    EKG None  Radiology VAS [place] LOWER EXTREMITY VENOUS (DVT) (ONLY [facility] & [facility]) Result Date: 05/16/2023  Lower Venous DVT Study Patient Name:  Debbie Mullins  Date of Exam:   05/16/2023 Medical Rec #: [id number]           Accession #:    [id number] Date of Birth: 1955/03/25           Patient Gender: F Patient Age:   11 years Exam Location:  [facility] Procedure:      VAS [place] LOWER EXTREMITY VENOUS (DVT) Referring Phys: Tanda Rockers --------------------------------------------------------------------------------  Indications: Swelling, Edema, SOB, and ulceration.  Comparison Study: No prior exam. Performing Technologist: Fernande Bras  Examination Guidelines: A complete evaluation includes B-mode imaging, spectral Doppler, color Doppler, and power Doppler as needed of all accessible portions of each vessel. Bilateral testing is considered an integral part of a complete examination. Limited examinations for reoccurring indications may be performed as noted. The reflux portion of the exam is performed with the patient in reverse Trendelenburg.  +---------+---------------+---------+-----------+----------+-------------------+ RIGHT    CompressibilityPhasicitySpontaneityPropertiesThrombus Aging      +---------+---------------+---------+-----------+----------+-------------------+ CFV      Full           Yes      Yes                                      +---------+---------------+---------+-----------+----------+-------------------+ SFJ      Full           Yes      Yes                                      +---------+---------------+---------+-----------+----------+-------------------+ FV Prox  Full                                                             +---------+---------------+---------+-----------+----------+-------------------+ FV Mid   Full                                                              +---------+---------------+---------+-----------+----------+-------------------+  FV DistalFull                                                             +---------+---------------+---------+-----------+----------+-------------------+ PFV      Full                                                             +---------+---------------+---------+-----------+----------+-------------------+ POP      Full           Yes      Yes                                      +---------+---------------+---------+-----------+----------+-------------------+ PTV                                                   Not well visualized +---------+---------------+---------+-----------+----------+-------------------+ PERO                                                  Not well visualized +---------+---------------+---------+-----------+----------+-------------------+   +----+---------------+---------+-----------+----------+--------------+ LEFTCompressibilityPhasicitySpontaneityPropertiesThrombus Aging +----+---------------+---------+-----------+----------+--------------+ CFV Full           Yes      Yes                                 +----+---------------+---------+-----------+----------+--------------+ SFJ Full           Yes      Yes                                 +----+---------------+---------+-----------+----------+--------------+    Summary: RIGHT: - There is no evidence of deep vein thrombosis in the lower extremity. However, portions of this examination were limited- see technologist comments above.  - No cystic structure found in the popliteal fossa.  LEFT: - No evidence of common femoral vein obstruction.   *See table(s) above for measurements and observations. Electronically signed by Heath Lark on 05/16/2023 at 8:38:31 PM.    Final     Procedures Procedures    Medications Ordered in ED Medications - No data to display  ED  Course/ Medical Decision Making/ A&P                                 Medical Decision Making 68 year old female with past medical history of diabetes, hypertension, and hyperlipidemia presenting to the emergency department today with lower extremity swelling.  I will further evaluate her here with an ultrasound of the right lower extremity to evaluate for DVT.  Will obtain a chest x-ray as well as basic labs to evaluate for  pulmonary edema.  The patient does have a chronic appearing ulcer to the right lower extremity does not appear to be infected.  Actually appears to have granulation tissue in the wound bed.  Suspicion for pulmonary embolism is low at this time based on description of her symptoms and reassuring vital signs.  I will reevaluate for ultimate disposition.  The patient's ultrasound is negative for DVT.  Her labs are reassuring.  She does have some mild hyponatremia with a sodium of 128.  X-rays pending at the time of signout but the patient is stable on her home oxygen.  I think that she may be discharged with low-dose Lasix if there is any pulmonary edema on her x-ray.  Amount and/or Complexity of Data Reviewed Labs: ordered. Radiology: ordered.          Final Clinical Impression(s) / ED Diagnoses Final diagnoses:  Lower extremity edema    Rx / DC Orders ED Discharge Orders     None         Durwin Glaze, MD 05/16/23 2314

## 2023-05-16 NOTE — ED Triage Notes (Signed)
 Pt c/o R leg swelling x several days; wound to inside of r ankle; endorses sob; denies pain; sent by PCP (Dr. Parke Simmers) to rule out blood clot; hx DM, COPD, HTN, CHF

## 2023-05-16 NOTE — ED Provider Notes (Signed)
 Care assumed at 2330.  Care assumed pending chest x-ray.  Chest x-ray is negative for acute abnormality.  CTA does demonstrate a right-sided pulmonary nodule, no evidence of PE or pneumonia.  Will start on antibiotics for possible cellulitis to the right lower extremity given the erythema in this region as well as ulcer to the medial ankle.  No evidence of sepsis at this time.  Discussed with patient home care for cellulitis, lower extremity edema as well as incidental finding of a pulmonary nodule that will require further workup.  Discussed outpatient follow-up and return precautions.   Tilden Fossa, MD 05/17/23 [phone]

## 2023-05-17 ENCOUNTER — Emergency Department (HOSPITAL_COMMUNITY)

## 2023-05-17 DIAGNOSIS — R918 Other nonspecific abnormal finding of lung field: Secondary | ICD-10-CM | POA: Diagnosis not present

## 2023-05-17 DIAGNOSIS — R0602 Shortness of breath: Secondary | ICD-10-CM | POA: Diagnosis not present

## 2023-05-17 DIAGNOSIS — J9811 Atelectasis: Secondary | ICD-10-CM | POA: Diagnosis not present

## 2023-05-17 DIAGNOSIS — K8020 Calculus of gallbladder without cholecystitis without obstruction: Secondary | ICD-10-CM | POA: Diagnosis not present

## 2023-05-17 DIAGNOSIS — M79604 Pain in right leg: Secondary | ICD-10-CM | POA: Diagnosis not present

## 2023-05-17 LAB — D-DIMER, QUANTITATIVE: D-Dimer, Quant: 1.29 ug{FEU}/mL — ABNORMAL HIGH (ref 0.00–0.50)

## 2023-05-17 LAB — BRAIN NATRIURETIC PEPTIDE: B Natriuretic Peptide: 223.4 pg/mL — ABNORMAL HIGH (ref 0.0–100.0)

## 2023-05-17 MED ORDER — DOXYCYCLINE HYCLATE 100 MG PO CAPS: 100.0000 mg | ORAL_CAPSULE | Freq: Two times a day (BID) | ORAL | 0 refills | Status: DC

## 2023-05-17 MED ORDER — IOHEXOL 350 MG/ML SOLN
75.0000 mL | Freq: Once | INTRAVENOUS | Status: AC | PRN
  Administered 2023-05-17: 75 mL via INTRAVENOUS

## 2023-05-17 MED ORDER — FUROSEMIDE 10 MG/ML IJ SOLN
20.0000 mg | Freq: Once | INTRAMUSCULAR | Status: AC
  Administered 2023-05-17: 20 mg via INTRAVENOUS
  Filled 2023-05-17: qty 2

## 2023-05-17 MED ORDER — DOXYCYCLINE HYCLATE 100 MG PO TABS
100.0000 mg | ORAL_TABLET | Freq: Once | ORAL | Status: AC
  Administered 2023-05-17: 100 mg via ORAL
  Filled 2023-05-17: qty 1

## 2023-05-17 MED ORDER — FUROSEMIDE 20 MG PO TABS: 20.0000 mg | ORAL_TABLET | Freq: Every day | ORAL | 0 refills | Status: DC

## 2023-05-17 NOTE — ED Notes (Signed)
 Pt repositioned in bed. HOB elevated at 30 degrees, Denies pain at this time. Alert and oriented x4, SPO2 92% on 3L via [place]. On pure wick, warm blankets given.

## 2023-05-17 NOTE — ED Notes (Signed)
 Patient verbalizes understanding of discharge instructions. Opportunity for questioning and answers were provided. Pt discharged from ED.

## 2023-05-17 NOTE — ED Notes (Signed)
 See earlier discharge note.

## 2023-05-21 DIAGNOSIS — L0390 Cellulitis, unspecified: Secondary | ICD-10-CM | POA: Diagnosis not present

## 2023-05-21 DIAGNOSIS — I509 Heart failure, unspecified: Secondary | ICD-10-CM | POA: Diagnosis not present

## 2023-05-21 DIAGNOSIS — I110 Hypertensive heart disease with heart failure: Secondary | ICD-10-CM | POA: Diagnosis not present

## 2023-06-11 DIAGNOSIS — J449 Chronic obstructive pulmonary disease, unspecified: Secondary | ICD-10-CM | POA: Diagnosis not present

## 2023-06-11 DIAGNOSIS — I10 Essential (primary) hypertension: Secondary | ICD-10-CM | POA: Diagnosis not present

## 2023-06-11 DIAGNOSIS — R609 Edema, unspecified: Secondary | ICD-10-CM | POA: Diagnosis not present

## 2023-06-18 ENCOUNTER — Encounter (HOSPITAL_BASED_OUTPATIENT_CLINIC_OR_DEPARTMENT_OTHER): Payer: Self-pay | Admitting: Pulmonary Disease

## 2023-06-18 ENCOUNTER — Ambulatory Visit (HOSPITAL_BASED_OUTPATIENT_CLINIC_OR_DEPARTMENT_OTHER): Payer: 59 | Admitting: Pulmonary Disease

## 2023-06-18 VITALS — BP 136/82 | HR 98 | Ht 61.0 in | Wt 143.3 lb

## 2023-06-18 DIAGNOSIS — J432 Centrilobular emphysema: Secondary | ICD-10-CM | POA: Diagnosis not present

## 2023-06-18 DIAGNOSIS — J9611 Chronic respiratory failure with hypoxia: Secondary | ICD-10-CM

## 2023-06-18 DIAGNOSIS — Z720 Tobacco use: Secondary | ICD-10-CM | POA: Diagnosis not present

## 2023-06-18 NOTE — Progress Notes (Signed)
 Subjective:   PATIENT ID: Debbie Mullins GENDER: female DOB: Dec 14, 1955, MRN: [id number]   HPI  Chief Complaint  Patient presents with   Follow-up    Centrilobular emphysema, chronic respiratory failure with hypoxia   Reason for Visit: Follow-up  [name] is a 68 year old female active smoker who presents for COPD follow-up.   04/07/21 At our last visit in July 2022, she was prescribed Anoro however ran out awhile ago. She reports using shortness of breath. Last COPD/CHF exacerbation required hospitalization in 04/2019. Denies cough or wheezing. No nocturnal symptoms. Denies any exacerbations since our last visit. She has been using her Albuterol twice a day but this will make her jittery. She like Anoro because she did not have an issues with side effects. She is compliant with her oxygen. She uses her walker and ambulate from one end of the room to the other. She does sitting exercises. She has decreased from 1/2 pdd to 4 cigarettes daily.   07/03/21 Since our last visit she reports well-controlled symptoms on Anoro. Has not needed her albuterol inhaler. Denies shortness of breath, wheezing or chronic cough. No exacerbations for >12 months. She limits her allergy exposure including staying at home during pollen season. Walks daily. Compliant with her oxygen. She is smoking 1/4 pack day  05/31/22 Since our last visit she has had ulcers and recent weight loss due to liquid diet. She is compliant Anoro. Uses albuterol with activity as needed or with weather changes, on average once a week. Otherwise denies shortness of breath, cough or wheezing. Compliant oxygen. Checks oxygen sometimes on room air and it 83%. She is smoking 2-3 cigarettes a day which is less than before. No exacerbations since our last visit.  12/19/22 She reports she is overall doing well. Compliant with Anoro. Denies shortness of breath cough or wheezing. Denies exacerbations. Has been checking oxygen with  lowest at 90%. Wearing oxygen as needed. Still smoking 4-5 cigarettes a day still.  06/18/23 Since our last visit she reports allergies is triggering her congestion and worsening shortness of breath. Cough is improved with robitussin. Currently on Anoro. Using Albuterol 3-4 times a day but improved to twice a day. Has not been compliant during the daytime. Wears at nightttime. She continues to smoke 2-3 cigarettes daily. Was recently seen in the ED last month for cellulitis and heart failure symptoms with LE swelling. Has improved since then with diuretics.  Social History: 41 pack-years. Currently smoking 1/4 ppd.  Past Medical History:  Diagnosis Date   Anemia    Asthma    CHF (congestive heart failure) (HCC)    takes Furosemide daily    Chronic respiratory failure with hypoxia (HCC) 03/02/2019   COPD (chronic obstructive pulmonary disease) (HCC)    Albuterol inhaler prn;SIngulair at night   Depression    takes Wellbutrin daily   Diabetes mellitus    takes Metformin daily   GERD (gastroesophageal reflux disease)    takes Nexium daily   History of colon polyps    benign   Hypertension    takes Lisinopril daily   Iron deficiency anemia 08/01/2017   Joint pain    Joint swelling    Leukocytosis 02/09/2011   Neck pain    HNP   Normocytic anemia 02/09/2011   Peripheral edema    takes Furosemide daily   Pneumonia    many yrs ago   Pulmonary nodules/lesions, multiple 08/09/2013   Shortness of breath dyspnea  with exertion   Thrombocytosis 02/09/2011   Urinary frequency    Urinary urgency    Weakness    numbness and tingling in both hands     No Known Allergies   Outpatient Medications Prior to Visit  Medication Sig Dispense Refill   albuterol (VENTOLIN HFA) 108 (90 Base) MCG/ACT inhaler Inhale 1-2 puffs into the lungs every 4 (four) hours as needed for wheezing or shortness of breath. 8 g 5   amLODipine (NORVASC) 5 MG tablet Take 5 mg by mouth daily.     ANORO ELLIPTA  62.5-25 MCG/ACT AEPB Inhale 1 puff into the lungs daily. 180 each 3   aspirin EC 325 MG tablet Take 325 mg by mouth every morning.      atorvastatin (LIPITOR) 10 MG tablet Take by mouth.     Clobetasol Prop Emollient Base 0.05 % emollient cream Apply 1 application topically 2 (two) times daily as needed (itching).      doxycycline (VIBRAMYCIN) 100 MG capsule Take 1 capsule (100 mg total) by mouth 2 (two) times daily. 20 capsule 0   esomeprazole (NEXIUM) 40 MG capsule Take 40 mg by mouth daily.     furosemide (LASIX) 20 MG tablet Take 1 tablet (20 mg total) by mouth daily. 5 tablet 0   JARDIANCE 25 MG TABS tablet Take 25 mg by mouth daily.     metFORMIN (GLUCOPHAGE) 500 MG tablet Take 1.5 tablets (750 mg total) by mouth 2 (two) times daily with a meal.     ondansetron (ZOFRAN-ODT) 4 MG disintegrating tablet Take 1 tablet (4 mg total) by mouth every 8 (eight) hours as needed for nausea or vomiting. 20 tablet 0   pantoprazole (PROTONIX) 40 MG tablet Take 40 mg by mouth daily.     sucralfate (CARAFATE) 1 g tablet Take 1 tablet (1 g total) by mouth 4 (four) times daily -  with meals and at bedtime. 90 tablet 0   ZORVOLEX 35 MG CAPS Take 1 capsule by mouth 3 (three) times daily.     No facility-administered medications prior to visit.    Review of Systems  Constitutional:  Negative for chills, diaphoresis, fever, malaise/fatigue and weight loss.  HENT:  Positive for congestion.   Respiratory:  Positive for cough and shortness of breath. Negative for hemoptysis, sputum production and wheezing.   Cardiovascular:  Negative for chest pain, palpitations and leg swelling.     Objective:   Vitals:   06/18/23 1102  BP: 136/82  Pulse: 98  SpO2: (!) 61%  Weight: 143 lb 4.8 oz (65 kg)  Height: 5\' 1"  (1.549 m)   SpO2: (!) 61 % (placed on 3L continuous came up to 91%)  Physical Exam: General: Chronically ill-appearing, no acute distress HENT: [place], AT Eyes: EOMI, no scleral icterus Respiratory:  Clear to auscultation bilaterally.  No crackles, wheezing or rales Cardiovascular: RRR, -M/R/G, no JVD Extremities:-Edema,-tenderness Neuro: AAO x4, CNII-XII grossly intact Psych: Normal mood, normal affect  Data Reviewed:  Imaging: CT Chest 01/21/19 - Parenchyma with mosaic attenuation and reticulonodular pattern bilaterally. Stable benign pulmonary nodules  CTA 07/25/19 - No PE. Unchanged mosaic attenulation, mild interlobular septal thickening. Anasarca CT Chest lung screen 09/26/22 - multiple small lung nodules bilaterally with largest RLL 5.7 mm. Mild centrilobular and paraseptal emphysema CTA 05/17/23 - Neg for PE. 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 Symptoms triggered by allergies, not in exacerbation but having congestion and cough. Reports dyspnea however not compliant with oxygen during the day. Discussed clinical course and management of COPD including oxygen compliance, bronchodilator regimen, preventive care and action plan for exacerbation.  Allergic Rhinitis --START claritin or zyrtec as directed --START flonase 2 sprays per nostril daily  COPD with emphysema GOLD Class C/D --CONTINUE Anoro ONE puff ONCE a day. This is your EVERYDAY inhaler. --CONTINUE Albuterol as needed. 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 We discussed smoking cessation for <3 minutes. We discussed triggers and stressors and ways to deal with them. We discussed barriers to continued smoking and benefits of smoking cessation.   RML nodule 6 mm --Enrolled in CT lung screen. Due 09/2023 with orders already in place   Health Maintenance Immunization History  Administered Date(s) Administered   Influenza Split 01/22/2016   Influenza,inj,Quad PF,6+ Mos 11/18/2014, 01/26/2018, 12/27/2018, 01/03/2020, 01/08/2021   Influenza,trivalent, recombinat, inj, PF 01/06/2017   Influenza-Unspecified 03/12/2014, 12/27/2021, 12/17/2022   Moderna Sars-Covid-2 Vaccination 04/25/2019, 05/10/2019, 06/13/2019   Pneumococcal Polysaccharide-23 08/12/2013   CT Lung Screen - Enrolled. Due 09/2023  No orders of the defined types were placed in this encounter.  No orders of the defined types were placed in this encounter.   Return in about 6 months (around 12/18/2023).  I have spent a total time of 32-minutes on the day of the appointment including chart review, data review, collecting history, coordinating care and discussing medical diagnosis and plan with the patient/family. Past medical history, allergies, medications were reviewed. Pertinent imaging, labs and tests included in this note have been reviewed and interpreted independently by me.  Cezar Misiaszek Mechele Collin, MD [facility] 06/18/2023 11:23 AM

## 2023-06-18 NOTE — Patient Instructions (Addendum)
 Allergic Rhinitis --START claritin or zyrtec as directed --START flonase 2 sprays per nostril daily  COPD with emphysema GOLD Class C/D --CONTINUE Anoro ONE puff ONCE a day. This is your EVERYDAY inhaler. --CONTINUE Albuterol as needed. This is your RESCUE inhaler to use as needed for shortness of breath or wheezing  Chronic hypoxemic respiratory failure --Due for re-certification --Counseled on oxygen compliance --CONTINUE supplemental 3L via [place] oxygen with activity and sleep --Goal SpO2 >88% --DME: [facility]  RML nodule 6 mm --Enrolled in CT lung screen. Due 09/2023 with orders already in place

## 2023-07-12 DIAGNOSIS — I509 Heart failure, unspecified: Secondary | ICD-10-CM | POA: Diagnosis not present

## 2023-07-12 DIAGNOSIS — R609 Edema, unspecified: Secondary | ICD-10-CM | POA: Diagnosis not present

## 2023-07-12 DIAGNOSIS — E1169 Type 2 diabetes mellitus with other specified complication: Secondary | ICD-10-CM | POA: Diagnosis not present

## 2023-07-12 DIAGNOSIS — I10 Essential (primary) hypertension: Secondary | ICD-10-CM | POA: Diagnosis not present

## 2023-07-12 DIAGNOSIS — J449 Chronic obstructive pulmonary disease, unspecified: Secondary | ICD-10-CM | POA: Diagnosis not present

## 2023-07-12 DIAGNOSIS — J441 Chronic obstructive pulmonary disease with (acute) exacerbation: Secondary | ICD-10-CM | POA: Diagnosis not present

## 2023-07-12 DIAGNOSIS — Z9981 Dependence on supplemental oxygen: Secondary | ICD-10-CM | POA: Diagnosis not present

## 2023-07-19 DIAGNOSIS — R600 Localized edema: Secondary | ICD-10-CM | POA: Diagnosis not present

## 2023-08-01 ENCOUNTER — Telehealth (HOSPITAL_BASED_OUTPATIENT_CLINIC_OR_DEPARTMENT_OTHER): Payer: Self-pay

## 2023-08-01 ENCOUNTER — Other Ambulatory Visit (HOSPITAL_BASED_OUTPATIENT_CLINIC_OR_DEPARTMENT_OTHER): Payer: Self-pay

## 2023-08-01 DIAGNOSIS — J9611 Chronic respiratory failure with hypoxia: Secondary | ICD-10-CM

## 2023-08-01 NOTE — Telephone Encounter (Signed)
    Copied from CRM [id number]. Topic: Clinical - Order For Equipment >> Aug 01, 2023  9:51 AM Justina Oman C wrote: Reason for CRM: Patient [phone] wants to change DME company that's affordable and can provide portable oxygen  machine so patient can move around and go to doctors appointments. Patient is asking for a physican order to start will the new DME company. Patient does not have the name of the DME company, and will call back with the name of business. >> Aug 01, 2023 10:20 AM Crist Dominion wrote: Patient called back and states she would like to start using [employer].

## 2023-08-01 NOTE — Telephone Encounter (Signed)
 Per Dr Washington Hacker last note in 2025 pt qualified for 4L of O2 with activity. I have sent an order just need your sig as pt is requesting to change her DME company

## 2023-08-06 DIAGNOSIS — J9610 Chronic respiratory failure, unspecified whether with hypoxia or hypercapnia: Secondary | ICD-10-CM | POA: Diagnosis not present

## 2023-08-12 ENCOUNTER — Encounter (HOSPITAL_COMMUNITY): Payer: Self-pay | Admitting: Emergency Medicine

## 2023-08-12 ENCOUNTER — Inpatient Hospital Stay (HOSPITAL_COMMUNITY)
Admission: EM | Admit: 2023-08-12 | Discharge: 2023-08-19 | DRG: 291 | Disposition: A | Discharge status: Skilled Nursing Facility | Attending: Internal Medicine | Admitting: Internal Medicine

## 2023-08-12 ENCOUNTER — Other Ambulatory Visit: Payer: Self-pay

## 2023-08-12 ENCOUNTER — Emergency Department (HOSPITAL_COMMUNITY)

## 2023-08-12 DIAGNOSIS — R944 Abnormal results of kidney function studies: Secondary | ICD-10-CM | POA: Diagnosis not present

## 2023-08-12 DIAGNOSIS — R531 Weakness: Secondary | ICD-10-CM | POA: Diagnosis not present

## 2023-08-12 DIAGNOSIS — Z794 Long term (current) use of insulin: Secondary | ICD-10-CM | POA: Diagnosis not present

## 2023-08-12 DIAGNOSIS — K648 Other hemorrhoids: Secondary | ICD-10-CM | POA: Diagnosis not present

## 2023-08-12 DIAGNOSIS — I10 Essential (primary) hypertension: Secondary | ICD-10-CM | POA: Diagnosis present

## 2023-08-12 DIAGNOSIS — E871 Hypo-osmolality and hyponatremia: Secondary | ICD-10-CM | POA: Diagnosis present

## 2023-08-12 DIAGNOSIS — Z96653 Presence of artificial knee joint, bilateral: Secondary | ICD-10-CM | POA: Diagnosis not present

## 2023-08-12 DIAGNOSIS — I130 Hypertensive heart and chronic kidney disease with heart failure and stage 1 through stage 4 chronic kidney disease, or unspecified chronic kidney disease: Principal | ICD-10-CM | POA: Diagnosis present

## 2023-08-12 DIAGNOSIS — I5032 Chronic diastolic (congestive) heart failure: Secondary | ICD-10-CM | POA: Diagnosis present

## 2023-08-12 DIAGNOSIS — E874 Mixed disorder of acid-base balance: Secondary | ICD-10-CM | POA: Diagnosis present

## 2023-08-12 DIAGNOSIS — M6281 Muscle weakness (generalized): Secondary | ICD-10-CM | POA: Diagnosis not present

## 2023-08-12 DIAGNOSIS — J449 Chronic obstructive pulmonary disease, unspecified: Secondary | ICD-10-CM | POA: Diagnosis present

## 2023-08-12 DIAGNOSIS — J441 Chronic obstructive pulmonary disease with (acute) exacerbation: Secondary | ICD-10-CM | POA: Diagnosis present

## 2023-08-12 DIAGNOSIS — E1122 Type 2 diabetes mellitus with diabetic chronic kidney disease: Secondary | ICD-10-CM | POA: Diagnosis not present

## 2023-08-12 DIAGNOSIS — K219 Gastro-esophageal reflux disease without esophagitis: Secondary | ICD-10-CM | POA: Diagnosis not present

## 2023-08-12 DIAGNOSIS — E785 Hyperlipidemia, unspecified: Secondary | ICD-10-CM | POA: Diagnosis not present

## 2023-08-12 DIAGNOSIS — I5023 Acute on chronic systolic (congestive) heart failure: Principal | ICD-10-CM

## 2023-08-12 DIAGNOSIS — N181 Chronic kidney disease, stage 1: Secondary | ICD-10-CM | POA: Diagnosis not present

## 2023-08-12 DIAGNOSIS — I5041 Acute combined systolic (congestive) and diastolic (congestive) heart failure: Secondary | ICD-10-CM | POA: Diagnosis not present

## 2023-08-12 DIAGNOSIS — I5033 Acute on chronic diastolic (congestive) heart failure: Secondary | ICD-10-CM | POA: Diagnosis not present

## 2023-08-12 DIAGNOSIS — Z79899 Other long term (current) drug therapy: Secondary | ICD-10-CM

## 2023-08-12 DIAGNOSIS — I110 Hypertensive heart disease with heart failure: Secondary | ICD-10-CM | POA: Diagnosis not present

## 2023-08-12 DIAGNOSIS — I509 Heart failure, unspecified: Secondary | ICD-10-CM | POA: Diagnosis not present

## 2023-08-12 DIAGNOSIS — Z720 Tobacco use: Secondary | ICD-10-CM | POA: Diagnosis present

## 2023-08-12 DIAGNOSIS — F17210 Nicotine dependence, cigarettes, uncomplicated: Secondary | ICD-10-CM | POA: Diagnosis not present

## 2023-08-12 DIAGNOSIS — Z7982 Long term (current) use of aspirin: Secondary | ICD-10-CM | POA: Diagnosis not present

## 2023-08-12 DIAGNOSIS — Z91199 Patient's noncompliance with other medical treatment and regimen due to unspecified reason: Secondary | ICD-10-CM

## 2023-08-12 DIAGNOSIS — R609 Edema, unspecified: Secondary | ICD-10-CM | POA: Diagnosis not present

## 2023-08-12 DIAGNOSIS — Z8249 Family history of ischemic heart disease and other diseases of the circulatory system: Secondary | ICD-10-CM

## 2023-08-12 DIAGNOSIS — I5043 Acute on chronic combined systolic (congestive) and diastolic (congestive) heart failure: Secondary | ICD-10-CM | POA: Diagnosis not present

## 2023-08-12 DIAGNOSIS — J9621 Acute and chronic respiratory failure with hypoxia: Secondary | ICD-10-CM | POA: Diagnosis present

## 2023-08-12 DIAGNOSIS — Z716 Tobacco abuse counseling: Secondary | ICD-10-CM

## 2023-08-12 DIAGNOSIS — I272 Pulmonary hypertension, unspecified: Secondary | ICD-10-CM | POA: Diagnosis not present

## 2023-08-12 DIAGNOSIS — R918 Other nonspecific abnormal finding of lung field: Secondary | ICD-10-CM | POA: Diagnosis not present

## 2023-08-12 DIAGNOSIS — Z7984 Long term (current) use of oral hypoglycemic drugs: Secondary | ICD-10-CM | POA: Diagnosis not present

## 2023-08-12 DIAGNOSIS — F32A Depression, unspecified: Secondary | ICD-10-CM | POA: Diagnosis present

## 2023-08-12 DIAGNOSIS — Z7401 Bed confinement status: Secondary | ICD-10-CM | POA: Diagnosis not present

## 2023-08-12 DIAGNOSIS — D509 Iron deficiency anemia, unspecified: Secondary | ICD-10-CM | POA: Diagnosis not present

## 2023-08-12 DIAGNOSIS — R278 Other lack of coordination: Secondary | ICD-10-CM | POA: Diagnosis not present

## 2023-08-12 LAB — CBC
HCT: 32.8 % — ABNORMAL LOW (ref 36.0–46.0)
Hemoglobin: 9.6 g/dL — ABNORMAL LOW (ref 12.0–15.0)
MCH: 22.8 pg — ABNORMAL LOW (ref 26.0–34.0)
MCHC: 29.3 g/dL — ABNORMAL LOW (ref 30.0–36.0)
MCV: 77.9 fL — ABNORMAL LOW (ref 80.0–100.0)
Platelets: 382 10*3/uL (ref 150–400)
RBC: 4.21 MIL/uL (ref 3.87–5.11)
RDW: 20.9 % — ABNORMAL HIGH (ref 11.5–15.5)
WBC: 7.7 10*3/uL (ref 4.0–10.5)
nRBC: 0 % (ref 0.0–0.2)

## 2023-08-12 LAB — BASIC METABOLIC PANEL WITH GFR
Anion gap: 10 (ref 5–15)
BUN: 22 mg/dL (ref 8–23)
CO2: 25 mmol/L (ref 22–32)
Calcium: 8.3 mg/dL — ABNORMAL LOW (ref 8.9–10.3)
Chloride: 93 mmol/L — ABNORMAL LOW (ref 98–111)
Creatinine, Ser: 1.01 mg/dL — ABNORMAL HIGH (ref 0.44–1.00)
GFR, Estimated: >60 mL/min (ref >60)
Glucose, Bld: 139 mg/dL — ABNORMAL HIGH (ref 70–99)
Potassium: 4.5 mmol/L (ref 3.5–5.1)
Sodium: 128 mmol/L — ABNORMAL LOW (ref 135–145)

## 2023-08-12 LAB — GLUCOSE, CAPILLARY
Glucose-Capillary: 98 mg/dL (ref 70–99)
Glucose-Capillary: 105 mg/dL — ABNORMAL HIGH (ref 70–99)

## 2023-08-12 LAB — MAGNESIUM: Magnesium: 1.6 mg/dL — ABNORMAL LOW (ref 1.7–2.4)

## 2023-08-12 LAB — BRAIN NATRIURETIC PEPTIDE: B Natriuretic Peptide: 871 pg/mL — ABNORMAL HIGH (ref 0.0–100.0)

## 2023-08-12 LAB — MRSA NEXT GEN BY PCR, NASAL: MRSA by PCR Next Gen: NOT DETECTED

## 2023-08-12 MED ORDER — ALBUTEROL SULFATE (2.5 MG/3ML) 0.083% IN NEBU
3.0000 mL | INHALATION_SOLUTION | RESPIRATORY_TRACT | Status: DC | PRN
Start: 2023-08-12 — End: 2023-08-19
  Administered 2023-08-16: 3 mL via RESPIRATORY_TRACT
  Filled 2023-08-12 (×2): qty 3

## 2023-08-12 MED ORDER — ACETAMINOPHEN 650 MG RE SUPP: 650.0000 mg | Freq: Four times a day (QID) | RECTAL | Status: DC | PRN

## 2023-08-12 MED ORDER — METFORMIN HCL 500 MG PO TABS
750.0000 mg | ORAL_TABLET | Freq: Two times a day (BID) | ORAL | Status: DC
  Administered 2023-08-13 (×2): 750 mg via ORAL
  Filled 2023-08-12 (×3): qty 2

## 2023-08-12 MED ORDER — NICOTINE 21 MG/24HR TD PT24
21.0000 mg | MEDICATED_PATCH | Freq: Every day | TRANSDERMAL | Status: DC
  Administered 2023-08-12 – 2023-08-19 (×8): 21 mg via TRANSDERMAL
  Filled 2023-08-12 (×8): qty 1

## 2023-08-12 MED ORDER — POLYETHYLENE GLYCOL 3350 17 G PO PACK: 17.0000 g | PACK | Freq: Every day | ORAL | Status: DC | PRN

## 2023-08-12 MED ORDER — FUROSEMIDE 10 MG/ML IJ SOLN
60.0000 mg | Freq: Once | INTRAMUSCULAR | Status: AC
  Administered 2023-08-12: 60 mg via INTRAVENOUS
  Filled 2023-08-12: qty 6

## 2023-08-12 MED ORDER — MAGNESIUM SULFATE 2 GM/50ML IV SOLN
2.0000 g | Freq: Once | INTRAVENOUS | Status: AC
  Administered 2023-08-12: 2 g via INTRAVENOUS
  Filled 2023-08-12: qty 50

## 2023-08-12 MED ORDER — FUROSEMIDE 10 MG/ML IJ SOLN
60.0000 mg | Freq: Two times a day (BID) | INTRAMUSCULAR | Status: DC
  Administered 2023-08-12 – 2023-08-18 (×12): 60 mg via INTRAVENOUS
  Filled 2023-08-12 (×12): qty 6

## 2023-08-12 MED ORDER — METFORMIN HCL 500 MG PO TABS
750.0000 mg | ORAL_TABLET | Freq: Two times a day (BID) | ORAL | Status: DC
  Filled 2023-08-12: qty 2

## 2023-08-12 MED ORDER — SODIUM CHLORIDE 0.9% FLUSH
3.0000 mL | Freq: Two times a day (BID) | INTRAVENOUS | Status: DC
  Administered 2023-08-12 – 2023-08-18 (×13): 3 mL via INTRAVENOUS

## 2023-08-12 MED ORDER — CHLORHEXIDINE GLUCONATE CLOTH 2 % EX PADS
6.0000 | MEDICATED_PAD | Freq: Every day | CUTANEOUS | Status: DC
  Administered 2023-08-13 – 2023-08-15 (×4): 6 via TOPICAL

## 2023-08-12 MED ORDER — EMPAGLIFLOZIN 25 MG PO TABS
25.0000 mg | ORAL_TABLET | Freq: Every day | ORAL | Status: DC
  Administered 2023-08-13 – 2023-08-18 (×6): 25 mg via ORAL
  Filled 2023-08-12 (×9): qty 1

## 2023-08-12 MED ORDER — SUCRALFATE 1 G PO TABS
1.0000 g | ORAL_TABLET | Freq: Three times a day (TID) | ORAL | Status: DC
  Administered 2023-08-12 – 2023-08-19 (×26): 1 g via ORAL
  Filled 2023-08-12 (×26): qty 1

## 2023-08-12 MED ORDER — ATORVASTATIN CALCIUM 10 MG PO TABS
10.0000 mg | ORAL_TABLET | Freq: Every day | ORAL | Status: DC
  Administered 2023-08-12 – 2023-08-18 (×7): 10 mg via ORAL
  Filled 2023-08-12 (×7): qty 1

## 2023-08-12 MED ORDER — POTASSIUM CHLORIDE CRYS ER 20 MEQ PO TBCR
20.0000 meq | EXTENDED_RELEASE_TABLET | Freq: Every day | ORAL | Status: DC
  Administered 2023-08-12 – 2023-08-16 (×5): 20 meq via ORAL
  Filled 2023-08-12 (×5): qty 1

## 2023-08-12 MED ORDER — ASPIRIN 325 MG PO TBEC
325.0000 mg | DELAYED_RELEASE_TABLET | Freq: Every morning | ORAL | Status: DC
  Administered 2023-08-13 – 2023-08-19 (×7): 325 mg via ORAL
  Filled 2023-08-12 (×9): qty 1

## 2023-08-12 MED ORDER — ACETAMINOPHEN 325 MG PO TABS
650.0000 mg | ORAL_TABLET | Freq: Four times a day (QID) | ORAL | Status: DC | PRN
  Administered 2023-08-13 – 2023-08-18 (×10): 650 mg via ORAL
  Filled 2023-08-12 (×10): qty 2

## 2023-08-12 MED ORDER — ENALAPRIL MALEATE 2.5 MG PO TABS
2.5000 mg | ORAL_TABLET | Freq: Every day | ORAL | Status: DC
  Administered 2023-08-13 – 2023-08-19 (×7): 2.5 mg via ORAL
  Filled 2023-08-12 (×8): qty 1

## 2023-08-12 MED ORDER — SODIUM CHLORIDE 0.9 % IV SOLN: 250.0000 mL | INTRAVENOUS | Status: AC | PRN

## 2023-08-12 MED ORDER — ENOXAPARIN SODIUM 40 MG/0.4ML IJ SOSY
40.0000 mg | PREFILLED_SYRINGE | INTRAMUSCULAR | Status: DC
  Administered 2023-08-12 – 2023-08-18 (×7): 40 mg via SUBCUTANEOUS
  Filled 2023-08-12 (×7): qty 0.4

## 2023-08-12 MED ORDER — SODIUM CHLORIDE 0.9% FLUSH: 3.0000 mL | INTRAVENOUS | Status: DC | PRN

## 2023-08-12 MED ORDER — PANTOPRAZOLE SODIUM 40 MG PO TBEC
80.0000 mg | DELAYED_RELEASE_TABLET | Freq: Every day | ORAL | Status: DC
  Administered 2023-08-14 – 2023-08-18 (×5): 80 mg via ORAL
  Filled 2023-08-12 (×5): qty 2

## 2023-08-12 MED ORDER — METOPROLOL TARTRATE 5 MG/5ML IV SOLN: 5.0000 mg | Freq: Four times a day (QID) | INTRAVENOUS | Status: DC | PRN

## 2023-08-12 MED ORDER — UMECLIDINIUM-VILANTEROL 62.5-25 MCG/ACT IN AEPB
1.0000 | INHALATION_SPRAY | Freq: Every day | RESPIRATORY_TRACT | Status: DC
  Administered 2023-08-13 – 2023-08-14 (×2): 1 via RESPIRATORY_TRACT
  Filled 2023-08-12: qty 14

## 2023-08-12 MED ORDER — AMLODIPINE BESYLATE 5 MG PO TABS
5.0000 mg | ORAL_TABLET | Freq: Every day | ORAL | Status: DC
  Administered 2023-08-13 – 2023-08-16 (×4): 5 mg via ORAL
  Filled 2023-08-12 (×4): qty 1

## 2023-08-12 MED ORDER — INSULIN ASPART 100 UNIT/ML IJ SOLN
0.0000 [IU] | Freq: Three times a day (TID) | INTRAMUSCULAR | Status: DC
  Administered 2023-08-14 (×2): 2 [IU] via SUBCUTANEOUS
  Administered 2023-08-15: 1 [IU] via SUBCUTANEOUS
  Administered 2023-08-15: 2 [IU] via SUBCUTANEOUS
  Administered 2023-08-16: 1 [IU] via SUBCUTANEOUS
  Administered 2023-08-16: 3 [IU] via SUBCUTANEOUS
  Administered 2023-08-16: 2 [IU] via SUBCUTANEOUS
  Administered 2023-08-17: 1 [IU] via SUBCUTANEOUS
  Administered 2023-08-17: 5 [IU] via SUBCUTANEOUS
  Administered 2023-08-18 (×2): 2 [IU] via SUBCUTANEOUS
  Administered 2023-08-19: 1 [IU] via SUBCUTANEOUS

## 2023-08-12 NOTE — Assessment & Plan Note (Signed)
 Continue inhalers

## 2023-08-12 NOTE — ED Triage Notes (Signed)
 Pt BIB [facility] from home for bilateral fluid retention, was seen by PCP for same and given lasix , pt reports continued weight gain but has not weighed, v/s 140/69, HR 76, 94% 4L [place] (baseline at home)

## 2023-08-12 NOTE — Assessment & Plan Note (Addendum)
 [employer] IV fluids currently.

## 2023-08-12 NOTE — ED Provider Notes (Signed)
 [place] EMERGENCY DEPARTMENT AT [facility] Provider Note   CSN: [id number] Arrival date & time: 08/12/23  1232     History  Chief Complaint  Patient presents with   Congestive Heart Failure    Debbie Mullins is a 68 y.o. female.  HPI She presents with her lower extremity edema.  She has a history of heart.  She began having edema about 1 month ago, saw her physician, had adjustment of her medications.  She has not seen her physician again since that time, continues to take that dose of Lasix .  This patient has increasing lower extremity edema, now presents for evaluation.  No chest pain, no dyspnea, no fever, chills, syncope.    Home Medications Prior to Admission medications   Medication Sig Start Date End Date Taking? Authorizing Provider  torsemide (DEMADEX) 5 MG tablet Take 5 mg by mouth daily. 08/10/23  Yes [provider]  albuterol  (VENTOLIN  HFA) 108 (90 Base) MCG/ACT inhaler Inhale 1-2 puffs into the lungs every 4 (four) hours as needed for wheezing or shortness of breath. 05/31/22   Quillian Brunt, MD  amLODipine  (NORVASC ) 5 MG tablet Take 5 mg by mouth daily. 09/13/20   [provider]  ANORO ELLIPTA  62.5-25 MCG/ACT AEPB Inhale 1 puff into the lungs daily. 12/19/22   Quillian Brunt, MD  aspirin  EC 325 MG tablet Take 325 mg by mouth every morning.     [provider]  atorvastatin  (LIPITOR) 10 MG tablet Take by mouth. 03/04/21   [provider]  Clobetasol  Prop Emollient Base 0.05 % emollient cream Apply 1 application topically 2 (two) times daily as needed (itching).  08/22/18   [provider]  esomeprazole  (NEXIUM ) 40 MG capsule Take 40 mg by mouth daily. 04/18/23   [provider]  furosemide  (LASIX ) 20 MG tablet Take 1 tablet (20 mg total) by mouth daily. 05/17/23   Kelsey Patricia, MD  JARDIANCE 25 MG TABS tablet Take 25 mg by mouth daily. 05/21/23   [provider]  metFORMIN  (GLUCOPHAGE )  500 MG tablet Take 1.5 tablets (750 mg total) by mouth 2 (two) times daily with a meal. 01/22/19   Justina Oman, MD  ondansetron  (ZOFRAN -ODT) 4 MG disintegrating tablet Take 1 tablet (4 mg total) by mouth every 8 (eight) hours as needed for nausea or vomiting. 03/23/22   Corbin Dess, PA-C  pantoprazole  (PROTONIX ) 40 MG tablet Take 40 mg by mouth daily. 11/10/20   [provider]  sucralfate  (CARAFATE ) 1 g tablet Take 1 tablet (1 g total) by mouth 4 (four) times daily -  with meals and at bedtime. 03/23/22   Corbin Dess, PA-C  ZORVOLEX 35 MG CAPS Take 1 capsule by mouth 3 (three) times daily. 03/09/19   [provider]      Allergies    Patient has no known allergies.    Review of Systems   Review of Systems  Physical Exam Updated Vital Signs BP (!) 154/65   Pulse 65   Temp 97.7 F (36.5 C) (Oral)   Resp 17   Ht 5\' 3"  (1.6 m)   Wt 80 kg   LMP 07/03/2011   SpO2 93%   BMI 31.24 kg/m  Physical Exam Vitals and nursing note reviewed.  Constitutional:      General: She is not in acute distress.    Appearance: She is well-developed.  HENT:     Head: Normocephalic and atraumatic.  Eyes:  Conjunctiva/sclera: Conjunctivae normal.  Cardiovascular:     Rate and Rhythm: Normal rate and regular rhythm.  Pulmonary:     Effort: Pulmonary effort is normal. No respiratory distress.     Breath sounds: Normal breath sounds. No stridor.  Abdominal:     General: There is no distension.  Musculoskeletal:     Right lower leg: Edema present.     Left lower leg: Edema present.  Skin:    General: Skin is warm and dry.  Neurological:     Mental Status: She is alert and oriented to person, place, and time.     Cranial Nerves: No cranial nerve deficit.  Psychiatric:        Mood and Affect: Mood normal.     ED Results / Procedures / Treatments   Labs (all labs ordered are listed, but only abnormal results are displayed) Labs Reviewed  BASIC  METABOLIC PANEL WITH GFR - Abnormal; Notable for the following components:      Result Value   Sodium 128 (*)    Chloride 93 (*)    Glucose, Bld 139 (*)    Creatinine, Ser 1.01 (*)    Calcium  8.3 (*)    All other components within normal limits  MAGNESIUM  - Abnormal; Notable for the following components:   Magnesium  1.6 (*)    All other components within normal limits  BRAIN NATRIURETIC PEPTIDE - Abnormal; Notable for the following components:   B Natriuretic Peptide 871.0 (*)    All other components within normal limits  CBC - Abnormal; Notable for the following components:   Hemoglobin 9.6 (*)    HCT 32.8 (*)    MCV 77.9 (*)    MCH 22.8 (*)    MCHC 29.3 (*)    RDW 20.9 (*)    All other components within normal limits  MRSA NEXT GEN BY PCR, NASAL  GLUCOSE, CAPILLARY  HIV ANTIBODY (ROUTINE TESTING W REFLEX)  BASIC METABOLIC PANEL WITH GFR    EKG EKG Interpretation Date/Time:  Monday August 12 2023 15:06:54 EDT Ventricular Rate:  68 PR Interval:  146 QRS Duration:  102 QT Interval:  373 QTC Calculation: 397 R Axis:   137  Text Interpretation: Sinus rhythm Probable left atrial enlargement Right axis deviation RSR' in V1 or V2, probably normal variant T wave abnormality Confirmed by Dorenda Gandy [phone]) on 08/12/2023 8:52:22 PM  Radiology DG Chest Portable 1 View Result Date: 08/12/2023 CLINICAL DATA:  CHF EXAM: PORTABLE CHEST 1 VIEW COMPARISON:  Chest x-ray 07/17/2023 FINDINGS: Lung volumes are low likely accentuating central pulmonary vascularity. There are minimal patchy opacities in the lung bases which have increased from prior. The costophrenic angles are clear. The cardiac silhouette is within normal limits this can be seen. No acute fractures are seen. Cervical spinal fusion plate is present. IMPRESSION: 1. Low lung volumes likely accentuating central pulmonary vascularity. 2. Minimal patchy opacities in the lung bases which have increased from prior, possibly  atelectasis. Electronically Signed   By: Tyron Gallon M.D.   On: 08/12/2023 15:14    Procedures Procedures    Medications Ordered in ED Medications  aspirin  EC tablet 325 mg (has no administration in time range)  amLODipine  (NORVASC ) tablet 5 mg (has no administration in time range)  atorvastatin  (LIPITOR) tablet 10 mg (has no administration in time range)  empagliflozin (JARDIANCE) tablet 25 mg (has no administration in time range)  pantoprazole  (PROTONIX ) EC tablet 80 mg (has no administration in time range)  sucralfate  (  CARAFATE ) tablet 1 g (1 g Oral Not Given 08/12/23 1825)  albuterol  (PROVENTIL ) (2.5 MG/3ML) 0.083% nebulizer solution 3 mL (has no administration in time range)  umeclidinium-vilanterol (ANORO ELLIPTA ) 62.5-25 MCG/ACT 1 puff (has no administration in time range)  furosemide  (LASIX ) injection 60 mg (60 mg Intravenous Given 08/12/23 1750)  potassium chloride  SA (KLOR-CON  M) CR tablet 20 mEq (20 mEq Oral Given 08/12/23 1750)  enalapril (VASOTEC) tablet 2.5 mg (has no administration in time range)  insulin  aspart (novoLOG ) injection 0-9 Units ( Subcutaneous Not Given 08/12/23 1724)  enoxaparin  (LOVENOX ) injection 40 mg (has no administration in time range)  sodium chloride  flush (NS) 0.9 % injection 3 mL (has no administration in time range)  sodium chloride  flush (NS) 0.9 % injection 3 mL (has no administration in time range)  0.9 %  sodium chloride  infusion (has no administration in time range)  acetaminophen  (TYLENOL ) tablet 650 mg (has no administration in time range)    Or  acetaminophen  (TYLENOL ) suppository 650 mg (has no administration in time range)  polyethylene glycol (MIRALAX / GLYCOLAX) packet 17 g (has no administration in time range)  nicotine  (NICODERM CQ  - dosed in mg/24 hours) patch 21 mg (21 mg Transdermal Patch Applied 08/12/23 1750)  metoprolol tartrate (LOPRESSOR) injection 5 mg (has no administration in time range)  Chlorhexidine  Gluconate Cloth 2 % PADS 6  each (has no administration in time range)  metFORMIN  (GLUCOPHAGE ) tablet 750 mg (has no administration in time range)  furosemide  (LASIX ) injection 60 mg (60 mg Intravenous Given 08/12/23 1523)  magnesium  sulfate IVPB 2 g 50 mL (0 g Intravenous Stopped 08/12/23 1626)    ED Course/ Medical Decision Making/ A&P                                 Medical Decision Making Patient presents with worsening bilateral lower extremity edema in spite of recently increased Lasix  per her physician.  Absence of chest pain dyspnea, reassuring.  Patient is on home oxygen  4 L which is her same oxygen .,  94% with this abnormal Cardiac 70 sinus normal and she has no chest pain, reassuring for low suspicion of ACS.  Amount and/or Complexity of Data Reviewed External Data Reviewed: notes. Labs: ordered. Decision-making details documented in ED Course. Radiology: ordered.  Risk Prescription drug management. Decision regarding hospitalization.   Update: Patient now requiring high flow humidified oxygen  15 L, though she notes that she feels comfortable.  BNP substantially elevated, given concern for heart failure exacerbation, patient received IV Lasix .  Given new oxygen  requirement, concern for acute exacerbation of congestive heart failure she will be admitted for further monitoring, management.  Final Clinical Impression(s) / ED Diagnoses Final diagnoses:  Acute on chronic systolic congestive heart failure (HCC)      Dorenda Gandy, MD 08/12/23 2053

## 2023-08-12 NOTE — Assessment & Plan Note (Signed)
 Continue atorvastatin 

## 2023-08-12 NOTE — Hospital Course (Signed)
 Patient is a 68 year old with history of COPD, T2DM, HTN, history of CHFpEF, ongoing tobacco use, chronic hypoxic respiratory failure on 2 L nasal cannula at home who presents with 1 to 9-month history of worsening lower extremity swelling and increased shortness of breath.  She saw her primary care physician who gave her medication for fluid but this has not significantly improved her breathing.  She was brought to the ED where she was noted to have a high BNP of 871 and had mild hyponatremia.  Patient also had significant oxygen  requirement and was placed on 15 L high flow nasal cannula to maintain her sats above 85%.

## 2023-08-12 NOTE — Assessment & Plan Note (Signed)
 Continue Jardiance Continue metformin  Continue sliding scale insulin 

## 2023-08-12 NOTE — H&P (Signed)
 History and Physical    Patient: Debbie Mullins ZOX:[id number] DOB: 07/21/1955 DOA: 08/12/2023 DOS: the patient was seen and examined on 08/12/2023 PCP: [name], MD  Patient coming from: Home  Chief Complaint:  Chief Complaint  Patient presents with   Congestive Heart Failure   HPI: ZARRA GEFFERT is a 68 y.o. female with medical history significant of  COPD, T2DM, HTN, history of CHFpEF, ongoing tobacco use, chronic hypoxic respiratory failure on 2 L nasal cannula at home who presents with 1 to 25-month history of worsening lower extremity swelling and increased shortness of breath.  She saw her primary care physician who gave her medication for fluid but this has not significantly improved her breathing.  She was brought to the ED where she was noted to have a high BNP of 871 and had mild hyponatremia.  Patient also had significant oxygen  requirement and was placed on 15 L high flow nasal cannula to maintain her sats above 85%. Review of Systems: As mentioned in the history of present illness. All other systems reviewed and are negative. Past Medical History:  Diagnosis Date   Anemia    Asthma    CHF (congestive heart failure) (HCC)    takes Furosemide  daily    Chronic respiratory failure with hypoxia (HCC) 03/02/2019   COPD (chronic obstructive pulmonary disease) (HCC)    Albuterol  inhaler prn;SIngulair  at night   Depression    takes Wellbutrin  daily   Diabetes mellitus    takes Metformin  daily   GERD (gastroesophageal reflux disease)    takes Nexium  daily   History of colon polyps    benign   Hypertension    takes Lisinopril  daily   Iron deficiency anemia 08/01/2017   Joint pain    Joint swelling    Leukocytosis 02/09/2011   Neck pain    HNP   Normocytic anemia 02/09/2011   Peripheral edema    takes Furosemide  daily   Pneumonia    many yrs ago   Pulmonary nodules/lesions, multiple 08/09/2013   Shortness of breath dyspnea    with exertion   Thrombocytosis  02/09/2011   Urinary frequency    Urinary urgency    Weakness    numbness and tingling in both hands   Past Surgical History:  Procedure Laterality Date   ANTERIOR CERVICAL DECOMP/DISCECTOMY FUSION N/A 11/17/2014   Procedure: Cervical four- cervical five Anterior Cervical Decompression with fusion and bonegraft;  Surgeon: Audie Bleacher, MD;  Location: [facility];  Service: Neurosurgery;  Laterality: N/A;  C45 anterior cervical decompression with fusion plating and bonegraft   BIOPSY  02/26/2018   Procedure: BIOPSY;  Surgeon: Alyce Jubilee, MD;  Location: [facility];  Service: Endoscopy;;  (colon) Gastric  duodenal   BREAST BIOPSY Right 04/09/2022   MM RT BREAST BX W LOC DEV 1ST LESION IMAGE BX SPEC STEREO GUIDE 04/09/2022 [facility]   COLONOSCOPY     COLONOSCOPY N/A 02/26/2018   Procedure: COLONOSCOPY;  Surgeon: Alyce Jubilee, MD;  Location: [facility];  Service: Endoscopy;  Laterality: N/A;  9:30am   ESOPHAGOGASTRODUODENOSCOPY N/A 02/26/2018   Procedure: ESOPHAGOGASTRODUODENOSCOPY (EGD);  Surgeon: Alyce Jubilee, MD;  Location: [facility];  Service: Endoscopy;  Laterality: N/A;   FEMUR IM NAIL Right 05/23/2016   FEMUR IM NAIL Right 05/23/2016   Procedure: INTRAMEDULLARY (IM) RETROGRADE FEMORAL NAILING;  Surgeon: Adah Acron, MD;  Location: [facility] OR;  Service: Orthopedics;  Laterality: Right;   KNEE ARTHROPLASTY Left 08/10/2013  Procedure:  TOTAL KNEE ARTHROPLASTY;  Surgeon: Adah Acron, MD;  Location[facility] OR;  Service: Orthopedics;  Laterality: Left;  Left Total Knee Arthroplasty Revision, Cemented, Semi-constrained,    LEFT AND RIGHT HEART CATHETERIZATION WITH CORONARY ANGIOGRAM N/A 01/07/2012   Procedure: LEFT AND RIGHT HEART CATHETERIZATION WITH CORONARY ANGIOGRAM;  Surgeon: Lake Pilgrim, MD;  Location: [facility];  Service: Cardiovascular;  Laterality: N/A;   TONSILLECTOMY     as a child   TOTAL KNEE ARTHROPLASTY Bilateral    Social History:  reports that she  has been smoking cigarettes. She started smoking about 49 years ago. She has a 65.7 pack-year smoking history. She has never used smokeless tobacco. She reports current alcohol use. She reports that she does not use drugs.  No Known Allergies  Family History  Problem Relation Age of Onset   Heart failure Mother    Cancer Maternal Grandmother    Colon cancer Neg Hx    Colon polyps Neg Hx    BRCA 1/2 Neg Hx    Breast cancer Neg Hx     Prior to Admission medications   Medication Sig Start Date End Date Taking? Authorizing Provider  torsemide (DEMADEX) 5 MG tablet Take 5 mg by mouth daily. 08/10/23  Yes [provider]  albuterol  (VENTOLIN  HFA) 108 (90 Base) MCG/ACT inhaler Inhale 1-2 puffs into the lungs every 4 (four) hours as needed for wheezing or shortness of breath. 05/31/22   Quillian Brunt, MD  amLODipine  (NORVASC ) 5 MG tablet Take 5 mg by mouth daily. 09/13/20   [provider]  ANORO ELLIPTA  62.5-25 MCG/ACT AEPB Inhale 1 puff into the lungs daily. 12/19/22   Quillian Brunt, MD  aspirin  EC 325 MG tablet Take 325 mg by mouth every morning.     [provider]  atorvastatin  (LIPITOR) 10 MG tablet Take by mouth. 03/04/21   [provider]  Clobetasol  Prop Emollient Base 0.05 % emollient cream Apply 1 application topically 2 (two) times daily as needed (itching).  08/22/18   [provider]  esomeprazole  (NEXIUM ) 40 MG capsule Take 40 mg by mouth daily. 04/18/23   [provider]  furosemide  (LASIX ) 20 MG tablet Take 1 tablet (20 mg total) by mouth daily. 05/17/23   Kelsey Patricia, MD  JARDIANCE 25 MG TABS tablet Take 25 mg by mouth daily. 05/21/23   [provider]  metFORMIN  (GLUCOPHAGE ) 500 MG tablet Take 1.5 tablets (750 mg total) by mouth 2 (two) times daily with a meal. 01/22/19   Justina Oman, MD  ondansetron  (ZOFRAN -ODT) 4 MG disintegrating tablet Take 1 tablet (4 mg total) by mouth every 8 (eight) hours as needed for  nausea or vomiting. 03/23/22   Corbin Dess, PA-C  pantoprazole  (PROTONIX ) 40 MG tablet Take 40 mg by mouth daily. 11/10/20   [provider]  sucralfate  (CARAFATE ) 1 g tablet Take 1 tablet (1 g total) by mouth 4 (four) times daily -  with meals and at bedtime. 03/23/22   Corbin Dess, PA-C  ZORVOLEX 35 MG CAPS Take 1 capsule by mouth 3 (three) times daily. 03/09/19   [provider]    Physical Exam: Vitals:   08/12/23 1300 08/12/23 1301 08/12/23 1304 08/12/23 1530  BP:  132/66  138/68  Pulse:  68  66  Resp:  (!) 24  (!) 22  Temp: (!) 96.3 F (35.7 C)     TempSrc: Temporal     SpO2:  (!) 84%  92% (!) 86%  Weight:      Height:       Physical Examination: General appearance - chronically ill appearing Chest - rales noted bilaterally Heart - normal rate and regular rhythm Abdomen - soft, nontender, nondistended, no masses or organomegaly Extremities - pedal edema 3+   Data Reviewed: Results for orders placed or performed during the hospital encounter of 08/12/23 (from the past 24 hours)  Basic metabolic panel     Status: Abnormal   Collection Time: 08/12/23  1:41 PM  Result Value Ref Range   Sodium 128 (L) 135 - 145 mmol/L   Potassium 4.5 3.5 - 5.1 mmol/L   Chloride 93 (L) 98 - 111 mmol/L   CO2 25 22 - 32 mmol/L   Glucose, Bld 139 (H) 70 - 99 mg/dL   BUN 22 8 - 23 mg/dL   Creatinine, Ser 1.61 (H) 0.44 - 1.00 mg/dL   Calcium  8.3 (L) 8.9 - 10.3 mg/dL   GFR, Estimated >09 >60 mL/min   Anion gap 10 5 - 15  Magnesium      Status: Abnormal   Collection Time: 08/12/23  1:41 PM  Result Value Ref Range   Magnesium  1.6 (L) 1.7 - 2.4 mg/dL  Brain natriuretic peptide (order ONLY if patient c/o SOB)     Status: Abnormal   Collection Time: 08/12/23  1:41 PM  Result Value Ref Range   B Natriuretic Peptide 871.0 (H) 0.0 - 100.0 pg/mL  CBC     Status: Abnormal   Collection Time: 08/12/23  1:41 PM  Result Value Ref Range   WBC 7.7 4.0 - 10.5 K/uL   RBC  4.21 3.87 - 5.11 MIL/uL   Hemoglobin 9.6 (L) 12.0 - 15.0 g/dL   HCT 45.4 (L) 09.8 - 11.9 %   MCV 77.9 (L) 80.0 - 100.0 fL   MCH 22.8 (L) 26.0 - 34.0 pg   MCHC 29.3 (L) 30.0 - 36.0 g/dL   RDW 14.7 (H) 82.9 - 56.2 %   Platelets 382 150 - 400 K/uL   nRBC 0.0 0.0 - 0.2 %  Glucose, capillary     Status: None   Collection Time: 08/12/23  5:17 PM  Result Value Ref Range   Glucose-Capillary 98 70 - 99 mg/dL   DG Chest Portable 1 View Result Date: 08/12/2023 CLINICAL DATA:  CHF EXAM: PORTABLE CHEST 1 VIEW COMPARISON:  Chest x-ray 07/17/2023 FINDINGS: Lung volumes are low likely accentuating central pulmonary vascularity. There are minimal patchy opacities in the lung bases which have increased from prior. The costophrenic angles are clear. The cardiac silhouette is within normal limits this can be seen. No acute fractures are seen. Cervical spinal fusion plate is present. IMPRESSION: 1. Low lung volumes likely accentuating central pulmonary vascularity. 2. Minimal patchy opacities in the lung bases which have increased from prior, possibly atelectasis. Electronically Signed   By: Tyron Gallon M.D.   On: 08/12/2023 15:14     Assessment and Plan: Hyponatremia Trend Holding IV fluids currently.  Acute on chronic respiratory failure with hypoxia (HCC) Patient is usually on 2 L at home this has been increased significantly.  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  COPD (chronic obstructive pulmonary disease) (HCC) Continue inhalers      Advance Care Planning:   Code Status: Prior full  Consults: None  Family Communication: Patient bedside  Severity of Illness: The appropriate patient status for this patient is INPATIENT. Inpatient status is judged to be reasonable and necessary in order to provide the required intensity of service to ensure the patient's safety. The patient's presenting symptoms, physical exam findings, and initial radiographic and laboratory data in the context of their chronic comorbidities is felt to place them at high risk for further clinical deterioration. Furthermore, it is not anticipated that the patient will be medically stable for discharge from the hospital within 2 midnights of admission.   * I certify that at the point of admission it is my clinical judgment that the patient will require inpatient hospital care spanning beyond 2 midnights from the point of admission due to high intensity of service, high risk for further deterioration and high frequency of surveillance required.*  Author: Granville Layer, MD 08/12/2023 4:26 PM  For on call review [email or web address].

## 2023-08-12 NOTE — Assessment & Plan Note (Addendum)
 IV Lasix  Potassium supplementation Last echo showed normal EF in 2021, repeat this during this admission Daily weights Low-salt diet

## 2023-08-12 NOTE — Assessment & Plan Note (Signed)
 Continue amlodipine  Add low-dose ARB

## 2023-08-12 NOTE — Assessment & Plan Note (Signed)
 Patient is usually on 2 L at home this has been increased significantly.  Continue oxygen  as needed

## 2023-08-12 NOTE — Assessment & Plan Note (Signed)
 Patient offered and accepted nicotine  patch Patient would benefit from smoking cessation.

## 2023-08-12 NOTE — Assessment & Plan Note (Signed)
 Hemoglobin is 9.6 and stable

## 2023-08-13 ENCOUNTER — Inpatient Hospital Stay (HOSPITAL_COMMUNITY)

## 2023-08-13 DIAGNOSIS — J441 Chronic obstructive pulmonary disease with (acute) exacerbation: Secondary | ICD-10-CM

## 2023-08-13 DIAGNOSIS — I5032 Chronic diastolic (congestive) heart failure: Secondary | ICD-10-CM | POA: Diagnosis not present

## 2023-08-13 DIAGNOSIS — I272 Pulmonary hypertension, unspecified: Secondary | ICD-10-CM | POA: Diagnosis present

## 2023-08-13 DIAGNOSIS — I5033 Acute on chronic diastolic (congestive) heart failure: Secondary | ICD-10-CM

## 2023-08-13 DIAGNOSIS — F17210 Nicotine dependence, cigarettes, uncomplicated: Secondary | ICD-10-CM

## 2023-08-13 LAB — ECHOCARDIOGRAM COMPLETE
AR max vel: 2.16 cm2
AV Area VTI: 2.22 cm2
AV Area mean vel: 2.08 cm2
AV Mean grad: 7 mmHg
AV Peak grad: 13 mmHg
Ao pk vel: 1.8 m/s
Area-P 1/2: 3.34 cm2
Height: 63 in
S' Lateral: 2.4 cm
Weight: 2885.38 [oz_av]

## 2023-08-13 LAB — BASIC METABOLIC PANEL WITH GFR
Anion gap: 8 (ref 5–15)
BUN: 24 mg/dL — ABNORMAL HIGH (ref 8–23)
CO2: 27 mmol/L (ref 22–32)
Calcium: 8.3 mg/dL — ABNORMAL LOW (ref 8.9–10.3)
Chloride: 91 mmol/L — ABNORMAL LOW (ref 98–111)
Creatinine, Ser: 0.9 mg/dL (ref 0.44–1.00)
GFR, Estimated: >60 mL/min (ref >60)
Glucose, Bld: 81 mg/dL (ref 70–99)
Potassium: 4.7 mmol/L (ref 3.5–5.1)
Sodium: 126 mmol/L — ABNORMAL LOW (ref 135–145)

## 2023-08-13 LAB — GLUCOSE, CAPILLARY
Glucose-Capillary: 83 mg/dL (ref 70–99)
Glucose-Capillary: 110 mg/dL — ABNORMAL HIGH (ref 70–99)
Glucose-Capillary: 96 mg/dL (ref 70–99)
Glucose-Capillary: 112 mg/dL — ABNORMAL HIGH (ref 70–99)

## 2023-08-13 LAB — HIV ANTIBODY (ROUTINE TESTING W REFLEX): HIV Screen 4th Generation wRfx: NONREACTIVE

## 2023-08-13 MED ORDER — MELATONIN 3 MG PO TABS
6.0000 mg | ORAL_TABLET | Freq: Once | ORAL | Status: AC
  Administered 2023-08-13: 6 mg via ORAL
  Filled 2023-08-13: qty 2

## 2023-08-13 MED ORDER — DM-GUAIFENESIN ER 30-600 MG PO TB12
1.0000 | ORAL_TABLET | Freq: Two times a day (BID) | ORAL | Status: DC
  Administered 2023-08-13 – 2023-08-19 (×12): 1 via ORAL
  Filled 2023-08-13 (×12): qty 1

## 2023-08-13 MED ORDER — AZITHROMYCIN 250 MG PO TABS
500.0000 mg | ORAL_TABLET | Freq: Every day | ORAL | Status: AC
  Administered 2023-08-13 – 2023-08-15 (×3): 500 mg via ORAL
  Filled 2023-08-13 (×3): qty 2

## 2023-08-13 MED ORDER — METHYLPREDNISOLONE SODIUM SUCC 40 MG IJ SOLR
40.0000 mg | Freq: Two times a day (BID) | INTRAMUSCULAR | Status: DC
  Administered 2023-08-13 – 2023-08-16 (×6): 40 mg via INTRAVENOUS
  Filled 2023-08-13 (×6): qty 1

## 2023-08-13 NOTE — Evaluation (Signed)
 Physical Therapy Evaluation Patient Details Name: Debbie Mullins MRN: [id number] DOB: [birth year] Today's Date: 08/13/2023  History of Present Illness  Debbie Mullins is a 68 y.o. female with medical history significant of  COPD, T2DM, HTN, history of CHFpEF, ongoing tobacco use, chronic hypoxic respiratory failure on 2 L nasal cannula at home who presents with 1 to 27-month history of worsening lower extremity swelling and increased shortness of breath.  She saw her primary care physician who gave her medication for fluid but this has not significantly improved her breathing.  She was brought to the ED where she was noted to have a high BNP of 871 and had mild hyponatremia.  Patient also had significant oxygen  requirement and was placed on 15 L high flow nasal cannula to maintain her sats above 85%.   Clinical Impression  Patient demonstrates slow labored movement for sitting up at bedside, once seated had to rest due to SOB with SpO2 drooping from 94% 85% while on 4 LPM, had to increase O2 back up to 7 LPM to keep above 90% during activities.  Patient very unsteady on feet and limited to a few side steps before having to sit due to fatigue, BLE weakness and difficulty breathing. Patient tolerated sitting up in chair after therapy. Patient will benefit from continued skilled physical therapy in hospital and recommended venue below to increase strength, balance, endurance for safe ADLs and gait.           If plan is discharge home, recommend the following: A lot of help with bathing/dressing/bathroom;A lot of help with walking and/or transfers;Help with stairs or ramp for entrance;Assistance with cooking/housework   Can travel by private vehicle   No    Equipment Recommendations None recommended by PT  Recommendations for Other Services       Functional Status Assessment Patient has had a recent decline in their functional status and demonstrates the ability to make significant  improvements in function in a reasonable and predictable amount of time.     Precautions / Restrictions Precautions Precautions: Fall Recall of Precautions/Restrictions: Intact Restrictions Weight Bearing Restrictions Per Provider Order: No      Mobility  Bed Mobility Overal bed mobility: Needs Assistance Bed Mobility: Supine to Sit     Supine to sit: Min assist, Mod assist     General bed mobility comments: increased time, labored movement    Transfers Overall transfer level: Needs assistance Equipment used: Rolling walker (2 wheels) Transfers: Sit to/from Stand, Bed to chair/wheelchair/BSC Sit to Stand: Mod assist   Step pivot transfers: Mod assist       General transfer comment: unsteady labored movement with poor tolerance for standing due to BLE weakness    Ambulation/Gait Ambulation/Gait assistance: Mod assist, Max assist Gait Distance (Feet): 4 Feet Assistive device: Rolling walker (2 wheels) Gait Pattern/deviations: Decreased step length - right, Decreased step length - left, Decreased stride length Gait velocity: slow     General Gait Details: limited to a few slow labored side steps at bedside due to BLE weakness, poor standing balance using RW  Stairs            Wheelchair Mobility     Tilt Bed    Modified Rankin (Stroke Patients Only)       Balance Overall balance assessment: Needs assistance Sitting-balance support: Feet supported, No upper extremity supported Sitting balance-Leahy Scale: Fair Sitting balance - Comments: fair/good seated at EOB   Standing balance support: Reliant on assistive device for  balance, During functional activity, Bilateral upper extremity supported Standing balance-Leahy Scale: Poor Standing balance comment: using RW                             Pertinent Vitals/Pain Pain Assessment Pain Assessment: No/denies pain    Home Living Family/patient expects to be discharged to:: Private  residence Living Arrangements: Alone Available Help at Discharge: Family;Available PRN/intermittently Type of Home: Mobile home Home Access: Ramped entrance       Home Layout: One level Home Equipment: Rollator (4 wheels);BSC/3in1;Grab bars - tub/shower      Prior Function Prior Level of Function : Independent/Modified Independent             Mobility Comments: household ambulation using Rollator ADLs Comments: Assisted by family     Extremity/Trunk Assessment   Upper Extremity Assessment Upper Extremity Assessment: Defer to OT evaluation    Lower Extremity Assessment Lower Extremity Assessment: Generalized weakness    Cervical / Trunk Assessment Cervical / Trunk Assessment: Normal  Communication   Communication Communication: No apparent difficulties    Cognition Arousal: Alert Behavior During Therapy: WFL for tasks assessed/performed   PT - Cognitive impairments: No apparent impairments                         Following commands: Intact       Cueing Cueing Techniques: Verbal cues     General Comments      Exercises     Assessment/Plan    PT Assessment Patient needs continued PT services  PT Problem List Decreased strength;Decreased activity tolerance;Decreased balance;Decreased mobility       PT Treatment Interventions DME instruction;Gait training;Stair training;Functional mobility training;Therapeutic activities;Therapeutic exercise;Balance training;Patient/family education    PT Goals (Current goals can be found in the Care Plan section)  Acute Rehab PT Goals Patient Stated Goal: return home after rehab PT Goal Formulation: With patient Time For Goal Achievement: 08/27/23 Potential to Achieve Goals: Good    Frequency Min 3X/week     Co-evaluation               AM-PAC PT "6 Clicks" Mobility  Outcome Measure Help needed turning from your back to your side while in a flat bed without using bedrails?: A Little Help  needed moving from lying on your back to sitting on the side of a flat bed without using bedrails?: A Lot Help needed moving to and from a bed to a chair (including a wheelchair)?: A Lot Help needed standing up from a chair using your arms (e.g., wheelchair or bedside chair)?: A Lot Help needed to walk in hospital room?: A Lot Help needed climbing 3-5 steps with a railing? : Total 6 Click Score: 12    End of Session Equipment Utilized During Treatment: Oxygen  Activity Tolerance: Patient tolerated treatment well;Patient limited by fatigue Patient left: in chair;with call bell/phone within reach Nurse Communication: Mobility status PT Visit Diagnosis: Unsteadiness on feet (R26.81);Other abnormalities of gait and mobility (R26.89);Muscle weakness (generalized) (M62.81)    Time: 1610-9604 PT Time Calculation (min) (ACUTE ONLY): 22 min   Charges:   PT Evaluation $PT Eval Moderate Complexity: 1 Mod PT Treatments $Therapeutic Activity: 8-22 mins PT General Charges $$ ACUTE PT VISIT: 1 Visit         1:52 PM, 08/13/23 Walton Guppy, MPT Physical Therapist with [facility] 336 [phone] office [phone] mobile phone

## 2023-08-13 NOTE — Progress Notes (Signed)
*  PRELIMINARY RESULTS* Echocardiogram 2D Echocardiogram has been performed.  Brettany Sydney E Zackarey Holleman 08/13/2023, 3:15 PM

## 2023-08-13 NOTE — NC FL2 (Signed)
 [place]  MEDICAID FL2 LEVEL OF CARE FORM     IDENTIFICATION  Patient Name: Debbie Mullins Birthdate: 03/05/1956 Sex: female Admission Date (Current Location): 08/12/2023  [facility] and [place] Number:  [employer]   Facility and Address:         Provider Number: [id number]  Attending Physician Name and Address:  Colin Dawley, MD  Relative Name and Phone Number:  Mamie Searles (Relative)  [phone]    Current Level of Care: Hospital Recommended Level of Care: Skilled Nursing Facility Prior Approval Number:    Date Approved/Denied:   PASRR Number: [id number]  Discharge Plan: SNF    Current Diagnoses: Patient Active Problem List   Diagnosis Date Noted   CHF (congestive heart failure) (HCC) 08/12/2023   Hyponatremia 08/12/2023   Physical deconditioning    Pressure injury of skin 07/26/2019   Acute on chronic respiratory failure with hypoxia (HCC) 12/15/2018   Iron deficiency anemia 08/01/2017   Osteoarthritis of spine with myelopathy, cervical region 11/17/2014   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   Pulmonary nodules/lesions, multiple 08/09/2013   HLD (hyperlipidemia) 01/22/2012   Pulmonary hypertension (HCC) 01/08/2012   Chronic diastolic (congestive) heart failure (HCC) 01/05/2012   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 (HCC) 01/02/2012   HTN (hypertension) 01/02/2012   Tobacco abuse 07/04/2011   Valgus deformity knees 05/16/2011   Mild Thrombocytosis 02/09/2011   COPD (chronic obstructive pulmonary disease) (HCC) 02/09/2011   Obesity 02/09/2011    Orientation RESPIRATION BLADDER Height & Weight     Self, Time, Situation, Place  O2 (4 L at home) Continent Weight: 81.8 kg Height:  5\' 3"  (160 cm)  BEHAVIORAL SYMPTOMS/MOOD NEUROLOGICAL BOWEL NUTRITION STATUS      Continent Diet (See DC summary)  AMBULATORY STATUS COMMUNICATION OF NEEDS Skin   Extensive Assist Verbally Normal                       Personal Care Assistance Level of  Assistance  Bathing, Feeding, Dressing Bathing Assistance: Maximum assistance Feeding assistance: Limited assistance Dressing Assistance: Maximum assistance     Functional Limitations Info  Sight, Hearing, Speech Sight Info: Adequate Hearing Info: Adequate Speech Info: Adequate    SPECIAL CARE FACTORS FREQUENCY  PT (By licensed PT)     PT Frequency: 5 times a week              Contractures Contractures Info: Not present    Additional Factors Info  Code Status, Allergies Code Status Info: FUll Allergies Info: NKDA           Current Medications (08/13/2023):  This is the current hospital active medication list Current Facility-Administered Medications  Medication Dose Route Frequency Provider Last Rate Last Admin   0.9 %  sodium chloride  infusion  250 mL Intravenous PRN Pratt, Tanya S, MD       acetaminophen  (TYLENOL ) tablet 650 mg  650 mg Oral Q6H PRN Pratt, Tanya S, MD   650 mg at 08/13/23 0347   Or   acetaminophen  (TYLENOL ) suppository 650 mg  650 mg Rectal Q6H PRN Pratt, Tanya S, MD       albuterol  (PROVENTIL ) (2.5 MG/3ML) 0.083% nebulizer solution 3 mL  3 mL Inhalation Q4H PRN Granville Layer, MD       amLODipine  (NORVASC ) tablet 5 mg  5 mg Oral Daily Pratt, Tanya S, MD   5 mg at 08/13/23 0940   aspirin  EC tablet 325 mg  325 mg Oral q morning Adriana Hopping,  Tanya S, MD   325 mg at 08/13/23 1033   atorvastatin  (LIPITOR) tablet 10 mg  10 mg Oral Daily Pratt, Tanya S, MD   10 mg at 08/12/23 2130   Chlorhexidine  Gluconate Cloth 2 % PADS 6 each  6 each Topical Q0600 Granville Layer, MD   6 each at 08/13/23 0617   empagliflozin (JARDIANCE) tablet 25 mg  25 mg Oral Daily Pratt, Tanya S, MD       enalapril (VASOTEC) tablet 2.5 mg  2.5 mg Oral Daily Pratt, Tanya S, MD   2.5 mg at 08/13/23 0940   enoxaparin  (LOVENOX ) injection 40 mg  40 mg Subcutaneous Q24H Pratt, Tanya S, MD   40 mg at 08/12/23 2129   furosemide  (LASIX ) injection 60 mg  60 mg Intravenous Q12H Pratt, Tanya S, MD   60 mg  at 08/13/23 4098   insulin  aspart (novoLOG ) injection 0-9 Units  0-9 Units Subcutaneous TID WC Granville Layer, MD       metFORMIN  (GLUCOPHAGE ) tablet 750 mg  750 mg Oral BID WC Madelynn Schilder, RPH   750 mg at 08/13/23 1191   metoprolol tartrate (LOPRESSOR) injection 5 mg  5 mg Intravenous Q6H PRN Pratt, Tanya S, MD       nicotine  (NICODERM CQ  - dosed in mg/24 hours) patch 21 mg  21 mg Transdermal Daily Pratt, Tanya S, MD   21 mg at 08/13/23 0941   [START ON 08/14/2023] pantoprazole  (PROTONIX ) EC tablet 80 mg  80 mg Oral Q1200 Pratt, Tanya S, MD       polyethylene glycol (MIRALAX / GLYCOLAX) packet 17 g  17 g Oral Daily PRN Pratt, Tanya S, MD       potassium chloride  SA (KLOR-CON  M) CR tablet 20 mEq  20 mEq Oral Daily Pratt, Tanya S, MD   20 mEq at 08/13/23 0940   sodium chloride  flush (NS) 0.9 % injection 3 mL  3 mL Intravenous Q12H Granville Layer, MD   3 mL at 08/13/23 0941   sodium chloride  flush (NS) 0.9 % injection 3 mL  3 mL Intravenous PRN Pratt, Tanya S, MD       sucralfate  (CARAFATE ) tablet 1 g  1 g Oral TID WC & HS Pratt, Tanya S, MD   1 g at 08/13/23 0940   umeclidinium-vilanterol (ANORO ELLIPTA ) 62.5-25 MCG/ACT 1 puff  1 puff Inhalation Daily Granville Layer, MD   1 puff at 08/13/23 0802     Discharge Medications: Please see discharge summary for a list of discharge medications.  Relevant Imaging Results:  Relevant Lab Results:   Additional Information Ss# [id number]  Orelia Binet, RN

## 2023-08-13 NOTE — Progress Notes (Signed)
 PROGRESS NOTE   Debbie Mullins, is a 68 y.o. female, DOB - 1955-05-09, JYN:[id number]  Admit date - 08/12/2023   Admitting Physician Granville Layer, MD  Outpatient Primary MD for the patient is [name], MD  LOS - 1  Chief Complaint  Patient presents with   Congestive Heart Failure        Brief Narrative:  68 year old with history of COPD, T2DM, HTN, history of CHFpEF, ongoing tobacco use, chronic hypoxic respiratory failure on 2 L nasal cannula at home admitted on 08/12/2023 with acute on chronic hypoxic respiratory failure due to combination of COPD and diastolic CHF exacerbation in the setting of severe pulmonary hypertension   physician who gave her medication for fluid but this has not significantly improved her breathing.  She was brought to the ED where she was noted to have a high BNP of 871 and had mild hyponatremia.  Patient also had significant oxygen  requirement and was placed on 15 L high flow nasal cannula to maintain her sats above 85%.    -Assessment and Plan: 1) Acute on Chronic diastolic (congestive) heart failure (HCC) Admitted with worsening lower extremity edema and elevated BNP  -echo from 08/13/2023 with EF of 60 to 65%, grade 1 diastolic dysfunction and severe pulmonary hypertension, no aortic stenosis  -IV Lasix  I/os, Daily weights and REDs Clip - Continue PTA Jardiance, Vasotec  2)Tobacco abuse and acute COPD exacerbation--- Smoking cessation counseling for 4 minutes today,  Give Nicotine  patch I have discussed tobacco cessation with the patient.  I have counseled the patient regarding the negative impacts of continued tobacco use including but not limited to lung cancer, COPD, and cardiovascular disease.  I have discussed alternatives to tobacco and modalities that may help facilitate tobacco cessation including but not limited to biofeedback, hypnosis, and medications.  Total time spent with tobacco counseling was 4 minutes. - IV steroids,  bronchodilators, mucolytics and azithromycin  as ordered  3)Acute on chronic respiratory failure with hypoxia (HCC) Patient is usually on 2 L at home  -- Patient admits to noncompliance with home O2 due to the need to smoke - She smokes 1 and half to 2 packs a day at times - Requiring 4 to 5 L of oxygen  here  4)Hyponatremia -History of chronic hyponatremia with baseline usually around 130 - denies significant beer or alcohol intake - Continue IV Lasix  monitor sodium closely  5) severe pulmonary hypertension--in the setting of COPD -Please see echo report - Management as above  6)Chronic iron deficiency anemia Hemoglobin is 9.6 and stable  7)HLD (hyperlipidemia) Continue atorvastatin   8)HTN (hypertension) Continue Amlodipine , and Vasotec  9)DM2--A1c 6.3 reflecting excellent diabetic control PTA Continue Jardiance hold metformin  Use Novolog /Humalog Sliding scale insulin  with Accu-Cheks/Fingersticks as ordered   10) generalized weakness and deconditioning-----physical therapy recommends SNF rehab  Status is: Inpatient   Disposition: The patient is from: Home              Anticipated d/c is to: SNF              Anticipated d/c date is: 2 days              Patient currently is not medically stable to d/c. Barriers: Not Clinically Stable-   Code Status :  -  Code Status: Full Code   Family Communication:   (patient is alert, awake and coherent)   DVT Prophylaxis  :   - SCDs   enoxaparin  (LOVENOX ) injection 40 mg Start: 08/12/23 2200  Lab Results  Component Value Date   PLT 382 08/12/2023    Inpatient Medications  Scheduled Meds:  amLODipine   5 mg Oral Daily   aspirin  EC  325 mg Oral q morning   atorvastatin   10 mg Oral Daily   Chlorhexidine  Gluconate Cloth  6 each Topical Q0600   empagliflozin  25 mg Oral Daily   enalapril  2.5 mg Oral Daily   enoxaparin  (LOVENOX ) injection  40 mg Subcutaneous Q24H   furosemide   60 mg Intravenous Q12H   insulin  aspart  0-9  Units Subcutaneous TID WC   metFORMIN   750 mg Oral BID WC   nicotine   21 mg Transdermal Daily   [START ON 08/14/2023] pantoprazole   80 mg Oral Q1200   potassium chloride   20 mEq Oral Daily   sodium chloride  flush  3 mL Intravenous Q12H   sucralfate   1 g Oral TID WC & HS   umeclidinium-vilanterol  1 puff Inhalation Daily   Continuous Infusions: PRN Meds:.acetaminophen  **OR** acetaminophen , albuterol , metoprolol tartrate, polyethylene glycol, sodium chloride  flush   Anti-infectives (From admission, onward)    None         Subjective: Debbie Mullins today has no fevers, no emesis,  No chest pain,   - Cough hypoxia and dyspnea persist -Voiding okay  Objective: Vitals:   08/13/23 0756 08/13/23 0800 08/13/23 0802 08/13/23 1646  BP:  (!) 132/50    Pulse:      Resp:  20    Temp: 97.6 F (36.4 C)   97.6 F (36.4 C)  TempSrc: Oral   Oral  SpO2:   93%   Weight:      Height:        Intake/Output Summary (Last 24 hours) at 08/13/2023 1823 Last data filed at 08/13/2023 1758 Gross per 24 hour  Intake 480 ml  Output 5400 ml  Net -4920 ml   Filed Weights   08/12/23 1259 08/12/23 1723 08/13/23 0421  Weight: 62.1 kg 80 kg 81.8 kg    Physical Exam  Gen:- Awake Alert, no conversational dyspnea HEENT:- [place].AT, No sclera icterus Nose- [place] 5L/min Neck-Supple Neck, +ve JVD,.  Lungs-improving air movement, no wheezing  CV- S1, S2 normal, irregular  Abd-  +ve B.Sounds, Abd Soft, No tenderness,    Extremity/Skin:- +ve  edema, pedal pulses present  Psych-affect is appropriate, oriented x3 Neuro-generalized weakness no new focal deficits, no tremors  Data Reviewed: I have personally reviewed following labs and imaging studies  CBC: Recent Labs  Lab 08/12/23 1341  WBC 7.7  HGB 9.6*  HCT 32.8*  MCV 77.9*  PLT 382   Basic Metabolic Panel: Recent Labs  Lab 08/12/23 1341 08/13/23 0501  NA 128* 126*  K 4.5 4.7  CL 93* 91*  CO2 25 27  GLUCOSE 139* 81  BUN 22 24*   CREATININE 1.01* 0.90  CALCIUM  8.3* 8.3*  MG 1.6*  --    GFR: Estimated Creatinine Clearance: 62.3 mL/min (by C-G formula based on SCr of 0.9 mg/dL).  Recent Results (from the past 240 hours)  MRSA Next Gen by PCR, Nasal     Status: None   Collection Time: 08/12/23  5:55 PM   Specimen: Nasal Mucosa; Nasal Swab  Result Value Ref Range Status   MRSA by PCR Next Gen NOT DETECTED NOT DETECTED Final    Comment: (NOTE) The GeneXpert MRSA Assay (FDA approved for NASAL specimens only), is one component of a comprehensive MRSA colonization surveillance program. It is not intended to  diagnose MRSA infection nor to guide or monitor treatment for MRSA infections. Test performance is not FDA approved in patients less than 79 years old. Performed at [facility], [address], [place], [place] [address]      Radiology Studies: ECHOCARDIOGRAM COMPLETE Result Date: 08/13/2023    ECHOCARDIOGRAM REPORT   Patient Name:   Debbie Mullins Date of Exam: 08/13/2023 Medical Rec #:  [id number]          Height:       63.0 in Accession #:    [id number]         Weight:       180.3 lb Date of Birth:  10-Jan-1956          BSA:          1.850 m Patient Age:    68 years           BP:           132/50 mmHg Patient Gender: F                  HR:           66 bpm. Exam Location:  [facility] Procedure: 2D Echo, Cardiac Doppler and Color Doppler (Both Spectral and Color            Flow Doppler were utilized during procedure). Indications:    CHF  History:        Patient has prior history of Echocardiogram examinations and                 Patient has no prior history of Echocardiogram examinations,                 most recent 07/25/2019. CHF; COPD.  Sonographer:    Jeralene Mom Referring Phys: [id number] Bertell Broach S PRATT IMPRESSIONS  1. Left ventricular ejection fraction, by estimation, is 60 to 65%. The left ventricle has normal function. The left ventricle has no regional wall motion abnormalities. Left ventricular diastolic  parameters are consistent with Grade I diastolic dysfunction (impaired relaxation). There is the interventricular septum is flattened in systole and diastole, consistent with right ventricular pressure and volume overload.  2. Right ventricular systolic function is severely reduced. The right ventricular size is severely enlarged. There is severely elevated pulmonary artery systolic pressure.  3. The mitral valve is normal in structure. No evidence of mitral valve regurgitation. No evidence of mitral stenosis.  4. The tricuspid valve is abnormal. Tricuspid valve regurgitation is moderate.  5. The aortic valve is tricuspid. There is mild calcification of the aortic valve. There is mild thickening of the aortic valve. Aortic valve regurgitation is not visualized. No aortic stenosis is present.  6. The inferior vena cava is dilated in size with <50% respiratory variability, suggesting right atrial pressure of 15 mmHg. FINDINGS  Left Ventricle: Left ventricular ejection fraction, by estimation, is 60 to 65%. The left ventricle has normal function. The left ventricle has no regional wall motion abnormalities. The left ventricular internal cavity size was normal in size. There is  no left ventricular hypertrophy. The interventricular septum is flattened in systole and diastole, consistent with right ventricular pressure and volume overload. Left ventricular diastolic parameters are consistent with Grade I diastolic dysfunction (impaired relaxation). Normal left ventricular filling pressure. Right Ventricle: The right ventricular size is severely enlarged. Right vetricular wall thickness was not well visualized. Right ventricular systolic function is severely reduced. There is severely elevated pulmonary artery systolic pressure.  The tricuspid regurgitant velocity is 4.69 m/s, and with an assumed right atrial pressure of 15 mmHg, the estimated right ventricular systolic pressure is 103.0 mmHg. Left Atrium: Left atrial size  was normal in size. Right Atrium: Right atrial size was normal in size. Pericardium: Trivial pericardial effusion is present. The pericardial effusion is circumferential. Mitral Valve: The mitral valve is normal in structure. No evidence of mitral valve regurgitation. No evidence of mitral valve stenosis. Tricuspid Valve: The tricuspid valve is abnormal. Tricuspid valve regurgitation is moderate . No evidence of tricuspid stenosis. Aortic Valve: The aortic valve is tricuspid. There is mild calcification of the aortic valve. There is mild thickening of the aortic valve. There is mild aortic valve annular calcification. Aortic valve regurgitation is not visualized. No aortic stenosis  is present. Aortic valve mean gradient measures 7.0 mmHg. Aortic valve peak gradient measures 13.0 mmHg. Aortic valve area, by VTI measures 2.22 cm. Pulmonic Valve: The pulmonic valve was not well visualized. Pulmonic valve regurgitation is mild. No evidence of pulmonic stenosis. Aorta: The aortic root and ascending aorta are structurally normal, with no evidence of dilitation. Venous: The inferior vena cava is dilated in size with less than 50% respiratory variability, suggesting right atrial pressure of 15 mmHg. IAS/Shunts: There is left bowing of the interatrial septum, suggestive of elevated right atrial pressure. No atrial level shunt detected by color flow Doppler.  LEFT VENTRICLE PLAX 2D LVIDd:         3.53 cm   Diastology LVIDs:         2.40 cm   LV e' medial:    7.62 cm/s LV PW:         1.00 cm   LV E/e' medial:  13.6 LV IVS:        1.00 cm   LV e' lateral:   9.14 cm/s LVOT diam:     2.00 cm   LV E/e' lateral: 11.4 LV SV:         76 LV SV Index:   41 LVOT Area:     3.14 cm  RIGHT VENTRICLE RV Basal diam:  4.00 cm RV Mid diam:    3.50 cm RV S prime:     7.72 cm/s TAPSE (M-mode): 0.9 cm LEFT ATRIUM           Index        RIGHT ATRIUM           Index LA diam:      4.20 cm 2.27 cm/m   RA Area:     16.20 cm LA Vol (A4C): 33.3 ml  18.00 ml/m  RA Volume:   42.70 ml  23.08 ml/m  AORTIC VALVE                     PULMONIC VALVE AV Area (Vmax):    2.16 cm      PR End Diast Vel: 10.76 msec AV Area (Vmean):   2.08 cm AV Area (VTI):     2.22 cm AV Vmax:           180.00 cm/s AV Vmean:          119.000 cm/s AV VTI:            0.344 m AV Peak Grad:      13.0 mmHg AV Mean Grad:      7.0 mmHg LVOT Vmax:         124.00 cm/s LVOT Vmean:  78.600 cm/s LVOT VTI:          0.243 m LVOT/AV VTI ratio: 0.71  AORTA Ao Root diam: 2.40 cm Ao Asc diam:  2.20 cm MITRAL VALVE                TRICUSPID VALVE MV Area (PHT): 3.34 cm     TR Peak grad:   88.0 mmHg MV Decel Time: 227 msec     TR Vmax:        469.00 cm/s MV E velocity: 104.00 cm/s MV A velocity: 125.00 cm/s  SHUNTS MV E/A ratio:  0.83         Systemic VTI:  0.24 m                             Systemic Diam: 2.00 cm Armida Lander MD Electronically signed by Armida Lander MD Signature Date/Time: 08/13/2023/4:52:16 PM    Final    DG Chest Portable 1 View Result Date: 08/12/2023 CLINICAL DATA:  CHF EXAM: PORTABLE CHEST 1 VIEW COMPARISON:  Chest x-ray 07/17/2023 FINDINGS: Lung volumes are low likely accentuating central pulmonary vascularity. There are minimal patchy opacities in the lung bases which have increased from prior. The costophrenic angles are clear. The cardiac silhouette is within normal limits this can be seen. No acute fractures are seen. Cervical spinal fusion plate is present. IMPRESSION: 1. Low lung volumes likely accentuating central pulmonary vascularity. 2. Minimal patchy opacities in the lung bases which have increased from prior, possibly atelectasis. Electronically Signed   By: Tyron Gallon M.D.   On: 08/12/2023 15:14   Scheduled Meds:  amLODipine   5 mg Oral Daily   aspirin  EC  325 mg Oral q morning   atorvastatin   10 mg Oral Daily   Chlorhexidine  Gluconate Cloth  6 each Topical Q0600   empagliflozin  25 mg Oral Daily   enalapril  2.5 mg Oral Daily   enoxaparin   (LOVENOX ) injection  40 mg Subcutaneous Q24H   furosemide   60 mg Intravenous Q12H   insulin  aspart  0-9 Units Subcutaneous TID WC   metFORMIN   750 mg Oral BID WC   nicotine   21 mg Transdermal Daily   [START ON 08/14/2023] pantoprazole   80 mg Oral Q1200   potassium chloride   20 mEq Oral Daily   sodium chloride  flush  3 mL Intravenous Q12H   sucralfate   1 g Oral TID WC & HS   umeclidinium-vilanterol  1 puff Inhalation Daily   Continuous Infusions:   LOS: 1 day    Colin Dawley M.D on 08/13/2023 at 6:23 PM  Go to [email or web address] - for contact info  [facility] - Office  [phone]  If 7PM-7AM, please contact night-coverage [email or web address] 08/13/2023, 6:23 PM

## 2023-08-13 NOTE — Plan of Care (Signed)
  Problem: Acute Rehab PT Goals(only PT should resolve) Goal: Pt Will Go Supine/Side To Sit Outcome: Progressing Flowsheets (Taken 08/13/2023 1354) Pt will go Supine/Side to Sit: with minimal assist Goal: Patient Will Transfer Sit To/From Stand Outcome: Progressing Flowsheets (Taken 08/13/2023 1354) Patient will transfer sit to/from stand: with minimal assist Goal: Pt Will Transfer Bed To Chair/Chair To Bed Outcome: Progressing Flowsheets (Taken 08/13/2023 1354) Pt will Transfer Bed to Chair/Chair to Bed: with min assist Goal: Pt Will Ambulate Outcome: Progressing Flowsheets (Taken 08/13/2023 1354) Pt will Ambulate:  15 feet  with minimal assist  with moderate assist  with rolling walker   1:54 PM, 08/13/23 Walton Guppy, MPT Physical Therapist with [facility] [phone] office [phone] mobile phone

## 2023-08-13 NOTE — TOC Initial Note (Signed)
 Transition of Care [facility]) - Initial/Assessment Note    Patient Details  Name: Debbie Mullins MRN: [id number] Date of Birth: 25-Sep-1955  [facility] of Care [facility]) CM/SW Contact:    Orelia Binet, RN Phone Number: 08/13/2023, 11:52 AM  Clinical Narrative:        Patient admitted with CHF. CM at the bedside, patient is on 7L oxygen  currently.  Her baseline is 4 L at home. She lives alone. PT is recommending SNF. Patient is agreeable. Her first choice is [facility]. FL2 completed and sent out for bed offers.            Expected Discharge Plan: Skilled Nursing Facility Barriers to Discharge: Continued Medical Work up   Patient Goals and CMS Choice Patient states their goals for this hospitalization and ongoing recovery are:: agreeable to SNF CMS Medicare.gov Compare Post Acute Care list provided to:: Patient Choice offered to / list presented to : Patient [place] ownership interest in [facility].provided to:: Patient    Expected Discharge Plan and Services       Living arrangements for the past 2 months: Single Family Home                                      Prior Living Arrangements/Services Living arrangements for the past 2 months: Single Family Home Lives with:: Self Patient language and need for interpreter reviewed:: Yes Do you feel safe going back to the place where you live?: Yes      Need for Family Participation in Patient Care: Yes (Comment) Care giver support system in place?: Yes (comment) Current home services: DME Criminal Activity/Legal Involvement Pertinent to Current Situation/Hospitalization: No - Comment as needed  Activities of Daily Living   ADL Screening (condition at time of admission) Independently performs ADLs?: Yes (appropriate for developmental age) Is the patient deaf or have difficulty hearing?: No Does the patient have difficulty seeing, even when wearing glasses/contacts?: No Does the patient have difficulty  concentrating, remembering, or making decisions?: No  Permission Sought/Granted                  Emotional Assessment     Affect (typically observed): Accepting Orientation: : Oriented to Self, Oriented to Place, Oriented to  Time, Oriented to Situation Alcohol / Substance Use: Not Applicable Psych Involvement: No (comment)  Admission diagnosis:  CHF (congestive heart failure) (HCC) [I50.9] Patient Active Problem List   Diagnosis Date Noted   CHF (congestive heart failure) (HCC) 08/12/2023   Hyponatremia 08/12/2023   Physical deconditioning    Pressure injury of skin 07/26/2019   Acute on chronic respiratory failure with hypoxia (HCC) 12/15/2018   Iron deficiency anemia 08/01/2017   Osteoarthritis of spine with myelopathy, cervical region 11/17/2014   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   Pulmonary nodules/lesions, multiple 08/09/2013   HLD (hyperlipidemia) 01/22/2012   Pulmonary hypertension (HCC) 01/08/2012   Chronic diastolic (congestive) heart failure (HCC) 01/05/2012   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 [place] [address] Phone: [phone] Fax: [phone]  [facility]) - [place], [place] - [address] [facility] [address] [place] [address] [place] [address] Phone: [phone] Fax: [phone]     Social Drivers of Health (SDOH) Social History: SDOH Screenings   Food Insecurity: No Food Insecurity (08/12/2023)  Housing: Low Risk  (08/12/2023)  Transportation Needs: No Transportation Needs (08/12/2023)  Utilities: Not At Risk  (08/12/2023)  Social Connections: Unknown (08/12/2023)  Tobacco Use: High Risk (08/12/2023)   SDOH Interventions:     Readmission Risk Interventions     No data to display

## 2023-08-14 DIAGNOSIS — J449 Chronic obstructive pulmonary disease, unspecified: Secondary | ICD-10-CM

## 2023-08-14 DIAGNOSIS — I5032 Chronic diastolic (congestive) heart failure: Secondary | ICD-10-CM | POA: Diagnosis not present

## 2023-08-14 DIAGNOSIS — I272 Pulmonary hypertension, unspecified: Secondary | ICD-10-CM

## 2023-08-14 DIAGNOSIS — E785 Hyperlipidemia, unspecified: Secondary | ICD-10-CM

## 2023-08-14 DIAGNOSIS — I10 Essential (primary) hypertension: Secondary | ICD-10-CM | POA: Diagnosis not present

## 2023-08-14 DIAGNOSIS — Z720 Tobacco use: Secondary | ICD-10-CM

## 2023-08-14 DIAGNOSIS — E1122 Type 2 diabetes mellitus with diabetic chronic kidney disease: Secondary | ICD-10-CM

## 2023-08-14 DIAGNOSIS — J9621 Acute and chronic respiratory failure with hypoxia: Secondary | ICD-10-CM

## 2023-08-14 DIAGNOSIS — E871 Hypo-osmolality and hyponatremia: Secondary | ICD-10-CM

## 2023-08-14 DIAGNOSIS — N181 Chronic kidney disease, stage 1: Secondary | ICD-10-CM

## 2023-08-14 LAB — GLUCOSE, CAPILLARY
Glucose-Capillary: 165 mg/dL — ABNORMAL HIGH (ref 70–99)
Glucose-Capillary: 110 mg/dL — ABNORMAL HIGH (ref 70–99)
Glucose-Capillary: 161 mg/dL — ABNORMAL HIGH (ref 70–99)
Glucose-Capillary: 185 mg/dL — ABNORMAL HIGH (ref 70–99)

## 2023-08-14 MED ORDER — BUDESONIDE 0.5 MG/2ML IN SUSP
0.5000 mg | Freq: Two times a day (BID) | RESPIRATORY_TRACT | Status: DC
  Administered 2023-08-14 – 2023-08-19 (×10): 0.5 mg via RESPIRATORY_TRACT
  Filled 2023-08-14 (×11): qty 2

## 2023-08-14 MED ORDER — ARFORMOTEROL TARTRATE 15 MCG/2ML IN NEBU
15.0000 ug | INHALATION_SOLUTION | Freq: Two times a day (BID) | RESPIRATORY_TRACT | Status: DC
  Administered 2023-08-14 – 2023-08-19 (×10): 15 ug via RESPIRATORY_TRACT
  Filled 2023-08-14 (×10): qty 2

## 2023-08-14 NOTE — Progress Notes (Signed)
 Heart Failure Navigator Progress Note  Tried to call patient in her room ICO7 @ [facility] with no answer to review Heart failure education.  Reached out to her RN and NT to see if her phone was working and within reach.  Also to ask them to provide patient with the "Living Better with Heart Failure" education folder. Will try to call back again today.  Celedonio Coil, RN, BSN [facility] Heart Failure Navigator Secure Chat Only

## 2023-08-14 NOTE — TOC Progression Note (Signed)
 Transition of Care [facility]) - Progression Note    Patient Details  Name: Debbie Mullins MRN: [id number] Date of Birth: 05-08-1955  Transition of Care [facility]) CM/SW Contact  Orelia Binet, RN Phone Number: 08/14/2023, 9:50 AM  Clinical Narrative:   Discussed bed offers with patient, she accepted [facility]. TOC CMA stating INS AUTH. Weaning patient, currently on 7L.   Expected Discharge Plan: Skilled Nursing Facility Barriers to Discharge: Continued Medical Work up  Expected Discharge Plan and Services      Living arrangements for the past 2 months: Single Family Home                    Social Determinants of Health (SDOH) Interventions SDOH Screenings   Food Insecurity: No Food Insecurity (08/12/2023)  Housing: Low Risk  (08/12/2023)  Transportation Needs: No Transportation Needs (08/12/2023)  Utilities: Not At Risk (08/12/2023)  Social Connections: Unknown (08/12/2023)  Tobacco Use: High Risk (08/12/2023)    Readmission Risk Interventions    08/14/2023    9:50 AM  Readmission Risk Prevention Plan  Transportation Screening Complete  PCP or Specialist Appt within 5-7 Days Not Complete  Home Care Screening Complete  Medication Review (RN CM) Complete

## 2023-08-14 NOTE — Progress Notes (Signed)
 Physical Therapy Treatment Patient Details Name: Debbie Mullins MRN: [id number] DOB: 28-Apr-1955 Today's Date: 08/14/2023   History of Present Illness Debbie Mullins is a 68 y.o. female with medical history significant of  COPD, T2DM, HTN, history of CHFpEF, ongoing tobacco use, chronic hypoxic respiratory failure on 2 L nasal cannula at home who presents with 1 to 77-month history of worsening lower extremity swelling and increased shortness of breath.  She saw her primary care physician who gave her medication for fluid but this has not significantly improved her breathing.  She was brought to the ED where she was noted to have a high BNP of 871 and had mild hyponatremia.  Patient also had significant oxygen  requirement and was placed on 15 L high flow nasal cannula to maintain her sats above 85%.    PT Comments  Patient presents seated in chair (assisted by nursing staff) and agreeable for therapy. Patient demonstrates fair/good return for completing BLE ROM/strengthening exercises with verbal cueing, very unsteady on feet and limited to taking a few steps forward/backwards before having to sit due to BLE weakness with buckling of knees. Patient tolerated staying up in chair after therapy. Patient will benefit from continued skilled physical therapy in hospital and recommended venue below to increase strength, balance, endurance for safe ADLs and gait.       If plan is discharge home, recommend the following: A lot of help with bathing/dressing/bathroom;A lot of help with walking and/or transfers;Help with stairs or ramp for entrance;Assistance with cooking/housework   Can travel by private vehicle     No  Equipment Recommendations  None recommended by PT    Recommendations for Other Services       Precautions / Restrictions Precautions Precautions: Fall Recall of Precautions/Restrictions: Intact Restrictions Weight Bearing Restrictions Per Provider Order: No     Mobility  Bed  Mobility               General bed mobility comments: Patient presents in chair (assisted by nursing staff)    Transfers Overall transfer level: Needs assistance Equipment used: Rolling walker (2 wheels) Transfers: Sit to/from Stand Sit to Stand: Mod assist           General transfer comment: poor tolerance for standing cue to buckling of knees/weakness    Ambulation/Gait Ambulation/Gait assistance: Mod assist, Max assist Gait Distance (Feet): 4 Feet Assistive device: Rolling walker (2 wheels) Gait Pattern/deviations: Decreased step length - right, Decreased step length - left, Decreased stride length Gait velocity: slow     General Gait Details: limited to a few steps forward/backwards before having to sit due to buckling of knees/weakness   Stairs             Wheelchair Mobility     Tilt Bed    Modified Rankin (Stroke Patients Only)       Balance Overall balance assessment: Needs assistance Sitting-balance support: Feet unsupported, No upper extremity supported Sitting balance-Leahy Scale: Fair Sitting balance - Comments: fair/good seated at EOB   Standing balance support: Reliant on assistive device for balance, During functional activity, Bilateral upper extremity supported Standing balance-Leahy Scale: Poor Standing balance comment: using RW                            Communication Communication Communication: No apparent difficulties  Cognition Arousal: Alert Behavior During Therapy: WFL for tasks assessed/performed   PT - Cognitive impairments: No apparent impairments  Following commands: Intact      Cueing Cueing Techniques: Verbal cues  Exercises General Exercises - Lower Extremity Long Arc Quad: Seated, AROM, Strengthening, Both, 10 reps Hip Flexion/Marching: Seated, AROM, Strengthening, Both, 10 reps Toe Raises: Seated, AROM, Strengthening, Both, 10 reps Heel Raises: Seated, AROM,  Strengthening, Both, 10 reps    General Comments        Pertinent Vitals/Pain Pain Assessment Pain Assessment: No/denies pain    Home Living                          Prior Function            PT Goals (current goals can now be found in the care plan section) Acute Rehab PT Goals Patient Stated Goal: return home after rehab PT Goal Formulation: With patient Time For Goal Achievement: 08/27/23 Potential to Achieve Goals: Good Progress towards PT goals: Progressing toward goals    Frequency    Min 3X/week      PT Plan      Co-evaluation              AM-PAC PT "6 Clicks" Mobility   Outcome Measure  Help needed turning from your back to your side while in a flat bed without using bedrails?: A Little Help needed moving from lying on your back to sitting on the side of a flat bed without using bedrails?: A Lot Help needed moving to and from a bed to a chair (including a wheelchair)?: A Lot Help needed standing up from a chair using your arms (e.g., wheelchair or bedside chair)?: A Lot Help needed to walk in hospital room?: A Lot Help needed climbing 3-5 steps with a railing? : Total 6 Click Score: 12    End of Session Equipment Utilized During Treatment: Oxygen  Activity Tolerance: Patient tolerated treatment well;Patient limited by fatigue Patient left: in chair;with call bell/phone within reach Nurse Communication: Mobility status PT Visit Diagnosis: Unsteadiness on feet (R26.81);Other abnormalities of gait and mobility (R26.89);Muscle weakness (generalized) (M62.81)     Time: 8657-8469 PT Time Calculation (min) (ACUTE ONLY): 20 min  Charges:    $Therapeutic Exercise: 8-22 mins $Therapeutic Activity: 8-22 mins PT General Charges $$ ACUTE PT VISIT: 1 Visit                     2:31 PM, 08/14/23 Walton Guppy, MPT Physical Therapist with [facility] [phone] office [phone] mobile phone

## 2023-08-14 NOTE — Progress Notes (Signed)
 PROGRESS NOTE   Debbie Mullins, is a 68 y.o. female, DOB - May 23, 1955, ZOX:[id number]  Admit date - 08/12/2023   Admitting Physician Granville Layer, MD  Outpatient Primary MD for the patient is [name], MD  LOS - 2  Chief Complaint  Patient presents with   Congestive Heart Failure        Brief Narrative:  68 year old with history of COPD, T2DM, HTN, history of CHFpEF, ongoing tobacco use, chronic hypoxic respiratory failure on 2 L nasal cannula at home admitted on 08/12/2023 with acute on chronic hypoxic respiratory failure due to combination of COPD and diastolic CHF exacerbation in the setting of severe pulmonary hypertension   physician who gave her medication for fluid but this has not significantly improved her breathing.  She was brought to the ED where she was noted to have a high BNP of 871 and had mild hyponatremia.  Patient also had significant oxygen  requirement and was placed on 15 L high flow nasal cannula to maintain her sats above 85%.    -Assessment and Plan: 1) Acute on Chronic diastolic (congestive) heart failure (HCC) Admitted with worsening lower extremity edema and elevated BNP  -echo from 08/13/2023 with EF of 60 to 65%, grade 1 diastolic dysfunction and severe pulmonary hypertension, no aortic stenosis  - Continue IV diuresis - Follow daily weights, and strict I's and O's and low-sodium diet - Continue to check Reds clip measurements and continue prior to admission Jardiance and Vasotec.  2)Tobacco abuse and acute COPD exacerbation--- -cessation counseling provided - Continue nicotine  patch. -continue  IV steroids, bronchodilators, mucolytics and azithromycin  as ordered  3)Acute on chronic respiratory failure with hypoxia (HCC) Patient is usually on 2-3 L at home  -- Patient admits to noncompliance with home O2 due to the need to smoke - She smokes half a pack to 1 pack sometimes. -Cessation counseling provided - At time of admission require up to 15 L  high flow nasal cannula; currently down to 7 and further down to 4 L to maintain saturation above 90%. - Will continue treatment with IV diuresis, steroids and antibiotics - Acute recurrent process secondary to CHF and COPD exacerbation.  4)Hyponatremia -History of chronic hyponatremia with baseline usually around 130; presumably component of hypervolemia in the setting of CHF exacerbation. - denies significant beer or alcohol intake - Continue IV Lasix   - Continue to follow electrolytes trend.  5) severe pulmonary hypertension--in the setting of COPD -Please see echo report - Continue management as above  6)Chronic iron deficiency anemia -No overt bleeding appreciated - Hemoglobin above 9 - Continue to follow hemoglobin trend intermittently.  7)HLD (hyperlipidemia) -Continue atorvastatin   8)HTN (hypertension) -Continue current antihypertensive agents -continue IV diuretics - Low-sodium diet discussed with patient.  9)DM2--A1c 6.3 reflecting excellent diabetic control PTA -Will continue Jardiance -Continue to hold metformin  - Follow CBG fluctuation - Continue sliding scale insulin .  10) generalized weakness and deconditioning-----physical therapy recommends SNF rehab - Patient in agreement with short-term rehabilitation. - TOC has been made aware of medical readiness anticipated in the next 48 hours.  Status is: Inpatient   Disposition: The patient is from: Skilled nursing facility              Anticipated d/c is to: SNF              Anticipated d/c date is: 2 days              Patient currently is not medically stable to d/c. Barriers:  Not Clinically Stable-   Code Status :  -  Code Status: Full Code   Family Communication:   (patient is alert, awake and coherent)   DVT Prophylaxis  :   - SCDs   enoxaparin  (LOVENOX ) injection 40 mg Start: 08/12/23 2200   Lab Results  Component Value Date   PLT 382 08/12/2023    Inpatient Medications  Scheduled Meds:   amLODipine   5 mg Oral Daily   arformoterol   15 mcg Nebulization BID   aspirin  EC  325 mg Oral q morning   atorvastatin   10 mg Oral Daily   azithromycin   500 mg Oral Daily   budesonide  (PULMICORT ) nebulizer solution  0.5 mg Nebulization BID   Chlorhexidine  Gluconate Cloth  6 each Topical Q0600   dextromethorphan -guaiFENesin   1 tablet Oral BID   empagliflozin  25 mg Oral Daily   enalapril  2.5 mg Oral Daily   enoxaparin  (LOVENOX ) injection  40 mg Subcutaneous Q24H   furosemide   60 mg Intravenous Q12H   insulin  aspart  0-9 Units Subcutaneous TID WC   methylPREDNISolone  (SOLU-MEDROL ) injection  40 mg Intravenous Q12H   nicotine   21 mg Transdermal Daily   pantoprazole   80 mg Oral Q1200   potassium chloride   20 mEq Oral Daily   sodium chloride  flush  3 mL Intravenous Q12H   sucralfate   1 g Oral TID WC & HS   Continuous Infusions: PRN Meds:.acetaminophen  **OR** acetaminophen , albuterol , metoprolol tartrate, polyethylene glycol, sodium chloride  flush   Anti-infectives (From admission, onward)    Start     Dose/Rate Route Frequency Ordered Stop   08/13/23 1930  azithromycin  (ZITHROMAX ) tablet 500 mg        500 mg Oral Daily 08/13/23 1842 08/16/23 0959         Subjective: Debbie Mullins chronically ill in appearance; reports no chest pain, no nausea, no vomiting.  Good urine output appreciated.  Still short winded with minimal activity, demonstrating positive lower extremity edema and requiring 4 L nasal cannula supplementation.  Objective: Vitals:   08/14/23 1200 08/14/23 1236 08/14/23 1300 08/14/23 1347  BP: (!) 125/54  (!) 134/57   Pulse: 71  69 72  Resp: 16  13 16   Temp:  97.8 F (36.6 C)    TempSrc:  Oral    SpO2: 97%  98% 92%  Weight:      Height:        Intake/Output Summary (Last 24 hours) at 08/14/2023 1634 Last data filed at 08/14/2023 0924 Gross per 24 hour  Intake --  Output 2900 ml  Net -2900 ml   Filed Weights   08/12/23 1723 08/13/23 0421 08/14/23 0421   Weight: 80 kg 81.8 kg 78.2 kg    Physical Exam General exam: Alert, awake, oriented x 3; reports breathing continue to improve; no nausea, no vomiting, no chest pain. Respiratory system: Positive rhonchi bilaterally; mild expiratory wheezing appreciated on exam and decreased breath sounds at the bases.  No using accessory muscles. Cardiovascular system:RRR. No rubs or gallops; mild JVD on exam. Gastrointestinal system: Abdomen is nondistended, soft and nontender. No organomegaly or masses felt. Normal bowel sounds heard. Central nervous system: No focal neurological deficits. Extremities: No cyanosis or clubbing; trace to 1+ edema appreciated bilaterally. Skin: No petechiae. Psychiatry: Judgement and insight appear normal. Mood & affect appropriate.   Data Reviewed: I have personally reviewed following labs and imaging studies  CBC: Recent Labs  Lab 08/12/23 1341  WBC 7.7  HGB 9.6*  HCT 32.8*  MCV 77.9*  PLT 382   Basic Metabolic Panel: Recent Labs  Lab 08/12/23 1341 08/13/23 0501  NA 128* 126*  K 4.5 4.7  CL 93* 91*  CO2 25 27  GLUCOSE 139* 81  BUN 22 24*  CREATININE 1.01* 0.90  CALCIUM  8.3* 8.3*  MG 1.6*  --    GFR: Estimated Creatinine Clearance: 60.9 mL/min (by C-G formula based on SCr of 0.9 mg/dL).  Recent Results (from the past 240 hours)  MRSA Next Gen by PCR, Nasal     Status: None   Collection Time: 08/12/23  5:55 PM   Specimen: Nasal Mucosa; Nasal Swab  Result Value Ref Range Status   MRSA by PCR Next Gen NOT DETECTED NOT DETECTED Final    Comment: (NOTE) The GeneXpert MRSA Assay (FDA approved for NASAL specimens only), is one component of a comprehensive MRSA colonization surveillance program. It is not intended to diagnose MRSA infection nor to guide or monitor treatment for MRSA infections. Test performance is not FDA approved in patients less than 21 years old. Performed at [facility], [address] [place], [place] [address]       Radiology Studies: ECHOCARDIOGRAM COMPLETE Result Date: 08/13/2023    ECHOCARDIOGRAM REPORT   Patient Name:   EYDIE WORMLEY Date of Exam: 08/13/2023 Medical Rec #:  [id number]          Height:       63.0 in Accession #:    [id number]         Weight:       180.3 lb Date of Birth:  10/18/1955          BSA:          1.850 m Patient Age:    68 years           BP:           132/50 mmHg Patient Gender: F                  HR:           66 bpm. Exam Location:  [facility] Procedure: 2D Echo, Cardiac Doppler and Color Doppler (Both Spectral and Color            Flow Doppler were utilized during procedure). Indications:    CHF  History:        Patient has prior history of Echocardiogram examinations and                 Patient has no prior history of Echocardiogram examinations,                 most recent 07/25/2019. CHF; COPD.  Sonographer:    Jeralene Mom Referring Phys: [id number] Bertell Broach S PRATT IMPRESSIONS  1. Left ventricular ejection fraction, by estimation, is 60 to 65%. The left ventricle has normal function. The left ventricle has no regional wall motion abnormalities. Left ventricular diastolic parameters are consistent with Grade I diastolic dysfunction (impaired relaxation). There is the interventricular septum is flattened in systole and diastole, consistent with right ventricular pressure and volume overload.  2. Right ventricular systolic function is severely reduced. The right ventricular size is severely enlarged. There is severely elevated pulmonary artery systolic pressure.  3. The mitral valve is normal in structure. No evidence of mitral valve regurgitation. No evidence of mitral stenosis.  4. The tricuspid valve is abnormal. Tricuspid valve regurgitation is moderate.  5. The aortic valve is tricuspid. There is mild calcification  of the aortic valve. There is mild thickening of the aortic valve. Aortic valve regurgitation is not visualized. No aortic stenosis is present.  6. The inferior vena cava is  dilated in size with <50% respiratory variability, suggesting right atrial pressure of 15 mmHg. FINDINGS  Left Ventricle: Left ventricular ejection fraction, by estimation, is 60 to 65%. The left ventricle has normal function. The left ventricle has no regional wall motion abnormalities. The left ventricular internal cavity size was normal in size. There is  no left ventricular hypertrophy. The interventricular septum is flattened in systole and diastole, consistent with right ventricular pressure and volume overload. Left ventricular diastolic parameters are consistent with Grade I diastolic dysfunction (impaired relaxation). Normal left ventricular filling pressure. Right Ventricle: The right ventricular size is severely enlarged. Right vetricular wall thickness was not well visualized. Right ventricular systolic function is severely reduced. There is severely elevated pulmonary artery systolic pressure. The tricuspid regurgitant velocity is 4.69 m/s, and with an assumed right atrial pressure of 15 mmHg, the estimated right ventricular systolic pressure is 103.0 mmHg. Left Atrium: Left atrial size was normal in size. Right Atrium: Right atrial size was normal in size. Pericardium: Trivial pericardial effusion is present. The pericardial effusion is circumferential. Mitral Valve: The mitral valve is normal in structure. No evidence of mitral valve regurgitation. No evidence of mitral valve stenosis. Tricuspid Valve: The tricuspid valve is abnormal. Tricuspid valve regurgitation is moderate . No evidence of tricuspid stenosis. Aortic Valve: The aortic valve is tricuspid. There is mild calcification of the aortic valve. There is mild thickening of the aortic valve. There is mild aortic valve annular calcification. Aortic valve regurgitation is not visualized. No aortic stenosis  is present. Aortic valve mean gradient measures 7.0 mmHg. Aortic valve peak gradient measures 13.0 mmHg. Aortic valve area, by VTI measures  2.22 cm. Pulmonic Valve: The pulmonic valve was not well visualized. Pulmonic valve regurgitation is mild. No evidence of pulmonic stenosis. Aorta: The aortic root and ascending aorta are structurally normal, with no evidence of dilitation. Venous: The inferior vena cava is dilated in size with less than 50% respiratory variability, suggesting right atrial pressure of 15 mmHg. IAS/Shunts: There is left bowing of the interatrial septum, suggestive of elevated right atrial pressure. No atrial level shunt detected by color flow Doppler.  LEFT VENTRICLE PLAX 2D LVIDd:         3.53 cm   Diastology LVIDs:         2.40 cm   LV e' medial:    7.62 cm/s LV PW:         1.00 cm   LV E/e' medial:  13.6 LV IVS:        1.00 cm   LV e' lateral:   9.14 cm/s LVOT diam:     2.00 cm   LV E/e' lateral: 11.4 LV SV:         76 LV SV Index:   41 LVOT Area:     3.14 cm  RIGHT VENTRICLE RV Basal diam:  4.00 cm RV Mid diam:    3.50 cm RV S prime:     7.72 cm/s TAPSE (M-mode): 0.9 cm LEFT ATRIUM           Index        RIGHT ATRIUM           Index LA diam:      4.20 cm 2.27 cm/m   RA Area:     16.20 cm LA  Vol (A4C): 33.3 ml 18.00 ml/m  RA Volume:   42.70 ml  23.08 ml/m  AORTIC VALVE                     PULMONIC VALVE AV Area (Vmax):    2.16 cm      PR End Diast Vel: 10.76 msec AV Area (Vmean):   2.08 cm AV Area (VTI):     2.22 cm AV Vmax:           180.00 cm/s AV Vmean:          119.000 cm/s AV VTI:            0.344 m AV Peak Grad:      13.0 mmHg AV Mean Grad:      7.0 mmHg LVOT Vmax:         124.00 cm/s LVOT Vmean:        78.600 cm/s LVOT VTI:          0.243 m LVOT/AV VTI ratio: 0.71  AORTA Ao Root diam: 2.40 cm Ao Asc diam:  2.20 cm MITRAL VALVE                TRICUSPID VALVE MV Area (PHT): 3.34 cm     TR Peak grad:   88.0 mmHg MV Decel Time: 227 msec     TR Vmax:        469.00 cm/s MV E velocity: 104.00 cm/s MV A velocity: 125.00 cm/s  SHUNTS MV E/A ratio:  0.83         Systemic VTI:  0.24 m                             Systemic  Diam: 2.00 cm Armida Lander MD Electronically signed by Armida Lander MD Signature Date/Time: 08/13/2023/4:52:16 PM    Final    Scheduled Meds:  amLODipine   5 mg Oral Daily   arformoterol   15 mcg Nebulization BID   aspirin  EC  325 mg Oral q morning   atorvastatin   10 mg Oral Daily   azithromycin   500 mg Oral Daily   budesonide  (PULMICORT ) nebulizer solution  0.5 mg Nebulization BID   Chlorhexidine  Gluconate Cloth  6 each Topical Q0600   dextromethorphan -guaiFENesin   1 tablet Oral BID   empagliflozin  25 mg Oral Daily   enalapril  2.5 mg Oral Daily   enoxaparin  (LOVENOX ) injection  40 mg Subcutaneous Q24H   furosemide   60 mg Intravenous Q12H   insulin  aspart  0-9 Units Subcutaneous TID WC   methylPREDNISolone  (SOLU-MEDROL ) injection  40 mg Intravenous Q12H   nicotine   21 mg Transdermal Daily   pantoprazole   80 mg Oral Q1200   potassium chloride   20 mEq Oral Daily   sodium chloride  flush  3 mL Intravenous Q12H   sucralfate   1 g Oral TID WC & HS   Continuous Infusions:   LOS: 2 days    Justina Oman M.D on 08/14/2023 at 4:34 PM  Go to [email or web address] - for contact info  [facility] - Office  [phone]  If 7PM-7AM, please contact night-coverage [email or web address] 08/14/2023, 4:34 PM

## 2023-08-14 NOTE — Plan of Care (Signed)
  Problem: Education: Goal: Knowledge of General Education information will improve Description: Including pain rating scale, medication(s)/side effects and non-pharmacologic comfort measures Outcome: Progressing   Problem: Health Behavior/Discharge Planning: Goal: Ability to manage health-related needs will improve Outcome: Progressing   Problem: Clinical Measurements: Goal: Ability to maintain clinical measurements within normal limits will improve Outcome: Progressing Goal: Will remain free from infection Outcome: Progressing Goal: Diagnostic test results will improve Outcome: Progressing Goal: Respiratory complications will improve Outcome: Progressing Goal: Cardiovascular complication will be avoided Outcome: Progressing   Problem: Activity: Goal: Risk for activity intolerance will decrease Outcome: Progressing   Problem: Nutrition: Goal: Adequate nutrition will be maintained Outcome: Progressing   Problem: Coping: Goal: Level of anxiety will decrease Outcome: Progressing   Problem: Elimination: Goal: Will not experience complications related to bowel motility Outcome: Progressing Goal: Will not experience complications related to urinary retention Outcome: Progressing   Problem: Pain Managment: Goal: General experience of comfort will improve and/or be controlled Outcome: Progressing   Problem: Safety: Goal: Ability to remain free from injury will improve Outcome: Progressing   Problem: Skin Integrity: Goal: Risk for impaired skin integrity will decrease Outcome: Progressing   Problem: Education: Goal: Ability to describe self-care measures that may prevent or decrease complications (Diabetes Survival Skills Education) will improve Outcome: Progressing Goal: Individualized Educational Video(s) Outcome: Progressing   Problem: Coping: Goal: Ability to adjust to condition or change in health will improve Outcome: Progressing   Problem: Fluid  Volume: Goal: Ability to maintain a balanced intake and output will improve Outcome: Progressing   Problem: Health Behavior/Discharge Planning: Goal: Ability to identify and utilize available resources and services will improve Outcome: Progressing Goal: Ability to manage health-related needs will improve Outcome: Progressing   Problem: Metabolic: Goal: Ability to maintain appropriate glucose levels will improve Outcome: Progressing   Problem: Nutritional: Goal: Maintenance of adequate nutrition will improve Outcome: Progressing Goal: Progress toward achieving an optimal weight will improve Outcome: Progressing   Problem: Skin Integrity: Goal: Risk for impaired skin integrity will decrease Outcome: Progressing   Problem: Tissue Perfusion: Goal: Adequacy of tissue perfusion will improve Outcome: Progressing   Problem: Education: Goal: Ability to demonstrate management of disease process will improve Outcome: Progressing Goal: Ability to verbalize understanding of medication therapies will improve Outcome: Progressing Goal: Individualized Educational Video(s) Outcome: Progressing   Problem: Activity: Goal: Capacity to carry out activities will improve Outcome: Progressing   Problem: Cardiac: Goal: Ability to achieve and maintain adequate cardiopulmonary perfusion will improve Outcome: Progressing   Problem: Education: Goal: Ability to demonstrate management of disease process will improve Outcome: Progressing Goal: Ability to verbalize understanding of medication therapies will improve Outcome: Progressing Goal: Individualized Educational Video(s) Outcome: Progressing   Problem: Activity: Goal: Capacity to carry out activities will improve Outcome: Progressing   Problem: Cardiac: Goal: Ability to achieve and maintain adequate cardiopulmonary perfusion will improve Outcome: Progressing

## 2023-08-15 ENCOUNTER — Encounter (HOSPITAL_COMMUNITY): Payer: Self-pay

## 2023-08-15 DIAGNOSIS — I5032 Chronic diastolic (congestive) heart failure: Secondary | ICD-10-CM | POA: Diagnosis not present

## 2023-08-15 DIAGNOSIS — I272 Pulmonary hypertension, unspecified: Secondary | ICD-10-CM | POA: Diagnosis not present

## 2023-08-15 DIAGNOSIS — E785 Hyperlipidemia, unspecified: Secondary | ICD-10-CM | POA: Diagnosis not present

## 2023-08-15 DIAGNOSIS — I10 Essential (primary) hypertension: Secondary | ICD-10-CM | POA: Diagnosis not present

## 2023-08-15 LAB — BASIC METABOLIC PANEL WITH GFR
Anion gap: 15 (ref 5–15)
BUN: 26 mg/dL — ABNORMAL HIGH (ref 8–23)
CO2: 29 mmol/L (ref 22–32)
Calcium: 8.2 mg/dL — ABNORMAL LOW (ref 8.9–10.3)
Chloride: 81 mmol/L — ABNORMAL LOW (ref 98–111)
Creatinine, Ser: 0.96 mg/dL (ref 0.44–1.00)
GFR, Estimated: >60 mL/min (ref >60)
Glucose, Bld: 131 mg/dL — ABNORMAL HIGH (ref 70–99)
Potassium: 4.2 mmol/L (ref 3.5–5.1)
Sodium: 125 mmol/L — ABNORMAL LOW (ref 135–145)

## 2023-08-15 LAB — GLUCOSE, CAPILLARY
Glucose-Capillary: 123 mg/dL — ABNORMAL HIGH (ref 70–99)
Glucose-Capillary: 120 mg/dL — ABNORMAL HIGH (ref 70–99)
Glucose-Capillary: 160 mg/dL — ABNORMAL HIGH (ref 70–99)
Glucose-Capillary: 164 mg/dL — ABNORMAL HIGH (ref 70–99)

## 2023-08-15 MED ORDER — PNEUMOCOCCAL 20-VAL CONJ VACC 0.5 ML IM SUSY: 0.5000 mL | PREFILLED_SYRINGE | INTRAMUSCULAR | Status: DC

## 2023-08-15 NOTE — Progress Notes (Signed)
 Heart Failure Nurse Navigator Progress Note  PCP: [name], MD PCP-Cardiologist: None Admission Diagnosis: Acute on chronic systolic congestive heart failure (HCC) Admitted from: Home  *[facility] patient that was called remotely.  Pt identity confirmed with 2 identifiers prior to HF education and interview. Patient is going to SNF following discharge from [facility] so New TOC schedule for 2 weeks.RN at the bedside to provide her with the "Living Better with HF" education folder. She preferred the Heart Failure clinic in [place] due to her location in [place].  Presentation:   Debbie Mullins presented with lower extremity edema that began 1 month prior. She saw her PCP and had adjustment of her medications but had not seen PCP again since that time.  She continued to take that dose of Lasix . No chest pain,dyspnea,fever, chills or syncope. Hx COPD, T2DM, HTN, CHF.BNP 223.4. Patient with ongoing tobacco use.  ECHO/ LVEF: 60-65% Grade 1 diastolic dysfunction (impaired relaxation)Interventricular septum is flattened in systole and diastole consistent with right ventricular pressure and volume overload .RV systolic function is severely reduced and severely enlarged.  Severely elevated pulmonary artery systolic pressure. Tricuspid valve is abnormal and mod regurgitation.  Mild thickening of aortic valve. Clinical Course:  Past Medical History:  Diagnosis Date   Anemia    Asthma    CHF (congestive heart failure) (HCC)    takes Furosemide  daily    Chronic respiratory failure with hypoxia (HCC) 03/02/2019   COPD (chronic obstructive pulmonary disease) (HCC)    Albuterol  inhaler prn;SIngulair  at night   Depression    takes Wellbutrin  daily   Diabetes mellitus    takes Metformin  daily   GERD (gastroesophageal reflux disease)    takes Nexium  daily   History of colon polyps    benign   Hypertension    takes Lisinopril  daily   Iron deficiency anemia 08/01/2017   Joint pain    Joint swelling     Leukocytosis 02/09/2011   Neck pain    HNP   Normocytic anemia 02/09/2011   Peripheral edema    takes Furosemide  daily   Pneumonia    many yrs ago   Pulmonary nodules/lesions, multiple 08/09/2013   Shortness of breath dyspnea    with exertion   Thrombocytosis 02/09/2011   Urinary frequency    Urinary urgency    Weakness    numbness and tingling in both hands     Social History   Socioeconomic History   Marital status: Single    Spouse name: Not on file   Number of children: 0   Years of education: Not on file   Highest education level: 12th grade  Occupational History   Not on file  Tobacco Use   Smoking status: Every Day    Current packs/day: 0.50    Average packs/day: 0.7 packs/day for 90.4 years (65.7 ttl pk-yrs)    Types: Cigarettes    Start date: 1976   Smokeless tobacco: Never   Tobacco comments:    Pt states she is smoking less than 0.25 a day now    pt currently trying to quit with gum/losenges  Vaping Use   Vaping status: Never Used  Substance and Sexual Activity   Alcohol use: Yes    Comment: occ-mixed drinks   Drug use: No   Sexual activity: Never  Other Topics Concern   Not on file  Social History Narrative   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 COUSINS TRANSPORTS HER.   Social  Drivers of Corporate investment banker Strain: Not on file  Food Insecurity: No Food Insecurity (08/15/2023)   Hunger Vital Sign    Worried About Running Out of Food in the Last Year: Never true    Ran Out of Food in the Last Year: Never true  Transportation Needs: No Transportation Needs (08/15/2023)   PRAPARE - Administrator, Civil Service (Medical): No    Lack of Transportation (Non-Medical): No  Physical Activity: Not on file  Stress: Not on file  Social Connections: Unknown (08/12/2023)   Social Connection and Isolation Panel [NHANES]    Frequency of Communication with Friends and Family: More than three times a week     Frequency of Social Gatherings with Friends and Family: Three times a week    Attends Religious Services: More than 4 times per year    Active Member of Clubs or Organizations: Yes    Attends Banker Meetings: 1 to 4 times per year    Marital Status: Patient declined   Education Assessment and Provision:  Detailed education and instructions provided on heart failure disease management including the following:  Signs and symptoms of Heart Failure When to call the physician Importance of daily weights Low sodium diet Fluid restriction Medication management Anticipated future follow-up appointments  Patient education given on each of the above topics.  Patient acknowledges understanding via teach back method and acceptance of all instructions.  Education Materials:  "Living Better With Heart Failure" Booklet, HF zone tool, & Daily Weight Tracker Tool.  Patient has scale at home: Patient has a scale but no batteries.  Said she would get some.  CHeck at the [facility] appointment and see. Patient has pill box at home: Yes.    Receives her medications in the mail.  High Risk Criteria for Readmission and/or Poor Patient Outcomes: Heart failure hospital admissions (last 6 months): 1  No Show rate: 6% Difficult social situation: None Demonstrates medication adherence: Yes Primary Language: English Literacy level: Reading, Writing & Comprehension  Barriers of Care:   No  Considerations/Referrals:   Referral made to Heart Failure Pharmacist Stewardship: Yes Referral made to Heart Failure CSW/NCM TOC: No Referral made to Heart & Vascular TOC clinic: Yes. New TOC 09/02/23 @ 10:15 [facility] @ the Advanced [facility] Failure [facility]  Items for Follow-up on DC/TOC: Daily weights-make sure pt was able to get batteries for her scale Diet & Fluid Restrictions Continued Heart Failure Education Smoking & ETOH Cessation  Celedonio Coil, RN, BSN [facility] Heart Failure  Navigator Secure Chat Only

## 2023-08-15 NOTE — Evaluation (Signed)
 Occupational Therapy Evaluation Patient Details Name: Debbie Mullins MRN: [id number] DOB: 1956/01/05 Today's Date: 08/15/2023   History of Present Illness   Debbie Mullins is a 68 y.o. female with medical history significant of  COPD, T2DM, HTN, history of CHFpEF, ongoing tobacco use, chronic hypoxic respiratory failure on 2 L nasal cannula at home who presents with 1 to 6-month history of worsening lower extremity swelling and increased shortness of breath.  She saw her primary care physician who gave her medication for fluid but this has not significantly improved her breathing.  She was brought to the ED where she was noted to have a high BNP of 871 and had mild hyponatremia.  Patient also had significant oxygen  requirement and was placed on 15 L high flow nasal cannula to maintain her sats above 85%.     Clinical Impressions Pt agreeable to OT evaluation. Pt required mod A for bed mobility and mod A for step pivot transfer to chair from EOB. Pt did have enough energy to take a couple steps away from the chair and back with RW. Unable to complete lower body ADL's at this time. Pt also demonstrating limited  B UE A/ROM. Pt left in the chair with call bell within reach and chair alarm set. Pt will benefit from continued OT in the hospital and recommended venue below to increase strength, balance, and endurance for safe ADL's.        If plan is discharge home, recommend the following:   A lot of help with walking and/or transfers;A lot of help with bathing/dressing/bathroom;Assistance with cooking/housework;Assist for transportation;Help with stairs or ramp for entrance     Functional Status Assessment   Patient has had a recent decline in their functional status and demonstrates the ability to make significant improvements in function in a reasonable and predictable amount of time.     Equipment Recommendations   None recommended by OT              Precautions/Restrictions   Precautions Precautions: Fall Recall of Precautions/Restrictions: Intact Restrictions Weight Bearing Restrictions Per Provider Order: No     Mobility Bed Mobility Overal bed mobility: Needs Assistance Bed Mobility: Supine to Sit     Supine to sit: Mod assist, Min assist     General bed mobility comments: two hand held assist to pull to sit    Transfers Overall transfer level: Needs assistance Equipment used: Rolling walker (2 wheels) Transfers: Sit to/from Stand, Bed to chair/wheelchair/BSC Sit to Stand: Mod assist     Step pivot transfers: Min assist, Mod assist     General transfer comment: Assist to boost from EOB; labored steps to chair with poor balance using RW.      Balance Overall balance assessment: Needs assistance Sitting-balance support: Feet unsupported, No upper extremity supported Sitting balance-Leahy Scale: Fair Sitting balance - Comments: fair/good seated at EOB   Standing balance support: Reliant on assistive device for balance, During functional activity, Bilateral upper extremity supported Standing balance-Leahy Scale: Poor Standing balance comment: using RW                           ADL either performed or assessed with clinical judgement   ADL Overall ADL's : Needs assistance/impaired     Grooming: Set up;Sitting   Upper Body Bathing: Set up;Sitting   Lower Body Bathing: Maximal assistance;Total assistance;Sitting/lateral leans   Upper Body Dressing : Set up;Sitting   Lower Body Dressing:  Maximal assistance;Total assistance;Sitting/lateral leans   Toilet Transfer: Moderate assistance;Stand-pivot;Rolling walker (2 wheels)   Toileting- Clothing Manipulation and Hygiene: Moderate assistance;Maximal assistance;Sitting/lateral lean         General ADL Comments: Pt able to take ~2 steps from and back to chair with RW.     Vision Baseline Vision/History: 1 Wears glasses Ability to See in  Adequate Light: 1 Impaired Patient Visual Report: No change from baseline Vision Assessment?: No apparent visual deficits     Perception Perception: Not tested       Praxis Praxis: Not tested       Pertinent Vitals/Pain Pain Assessment Pain Assessment: 0-10 Pain Score: 7  Pain Location: B LE Pain Descriptors / Indicators: Discomfort Pain Intervention(s): Monitored during session, Repositioned     Extremity/Trunk Assessment Upper Extremity Assessment Upper Extremity Assessment: Generalized weakness (3-/5 shoulder flexion bilaterally; generally weak otherwise.)   Lower Extremity Assessment Lower Extremity Assessment: Defer to PT evaluation   Cervical / Trunk Assessment Cervical / Trunk Assessment: Normal   Communication Communication Communication: No apparent difficulties   Cognition Arousal: Alert Behavior During Therapy: WFL for tasks assessed/performed Cognition: No apparent impairments                               Following commands: Intact       Cueing  General Comments   Cueing Techniques: Verbal cues                 Home Living Family/patient expects to be discharged to:: Private residence Living Arrangements: Alone Available Help at Discharge: Family;Available PRN/intermittently Type of Home: Mobile home Home Access: Ramped entrance     Home Layout: One level     Bathroom Shower/Tub: Chief Strategy Officer: Handicapped height Bathroom Accessibility: Yes   Home Equipment: Rollator (4 wheels);BSC/3in1;Grab bars - tub/shower   Additional Comments: per chart      Prior Functioning/Environment Prior Level of Function : Independent/Modified Independent;History of Falls (last six months) (2 falls in past 6 months)             Mobility Comments: household ambulation using Rollator ADLs Comments: Pt reports independence with ADL's and IADL's.    OT Problem List: Decreased strength;Decreased range of  motion;Decreased activity tolerance;Impaired balance (sitting and/or standing);Pain   OT Treatment/Interventions: Self-care/ADL training;Therapeutic exercise;Therapeutic activities;Balance training;Patient/family education;DME and/or AE instruction      OT Goals(Current goals can be found in the care plan section)   Acute Rehab OT Goals Patient Stated Goal: improve function OT Goal Formulation: With patient Time For Goal Achievement: 08/29/23 Potential to Achieve Goals: Good   OT Frequency:  Min 2X/week    Co-evaluation PT/OT/SLP Co-Evaluation/Treatment: Yes Reason for Co-Treatment: To address functional/ADL transfers   OT goals addressed during session: ADL's and self-care                       End of Session Equipment Utilized During Treatment: Rolling walker (2 wheels);Gait belt  Activity Tolerance: Patient tolerated treatment well Patient left: in chair;with call bell/phone within reach;with chair alarm set;with family/visitor present  OT Visit Diagnosis: Unsteadiness on feet (R26.81);Other abnormalities of gait and mobility (R26.89);Muscle weakness (generalized) (M62.81);History of falling (Z91.81)                Time: 1131-1146 OT Time Calculation (min): 15 min Charges:  OT General Charges $OT Visit: 1 Visit OT Evaluation $OT Eval Low Complexity:  1 Low  Marcellius Montagna OT, MOT  Thurnell Floss 08/15/2023, 1:49 PM

## 2023-08-15 NOTE — Plan of Care (Signed)
  Problem: Acute Rehab OT Goals (only OT should resolve) Goal: Pt. Will Perform Grooming Flowsheets (Taken 08/15/2023 1352) Pt Will Perform Grooming: with modified independence Goal: Pt. Will Perform Upper Body Dressing Flowsheets (Taken 08/15/2023 1352) Pt Will Perform Upper Body Dressing: with modified independence Goal: Pt. Will Perform Lower Body Dressing Flowsheets (Taken 08/15/2023 1352) Pt Will Perform Lower Body Dressing:  with min assist  sitting/lateral leans Goal: Pt. Will Transfer To Toilet Flowsheets (Taken 08/15/2023 1352) Pt Will Transfer to Toilet:  with contact guard assist  ambulating Goal: Pt. Will Perform Toileting-Clothing Manipulation Flowsheets (Taken 08/15/2023 1352) Pt Will Perform Toileting - Clothing Manipulation and hygiene:  sitting/lateral leans  with contact guard assist Goal: Pt/Caregiver Will Perform Home Exercise Program Flowsheets (Taken 08/15/2023 1352) Pt/caregiver will Perform Home Exercise Program:  Increased ROM  Increased strength  Both right and left upper extremity  Independently  Marshia Tropea OT, MOT

## 2023-08-15 NOTE — Progress Notes (Signed)
 PROGRESS NOTE   Debbie Mullins, is a 68 y.o. female, DOB - June 30, 1955, ZOX:[id number]  Admit date - 08/12/2023   Admitting Physician Provider Default, MD  Outpatient Primary MD for the patient is [name], MD  LOS - 3  Chief Complaint  Patient presents with   Congestive Heart Failure        Brief Narrative:  68 year old with history of COPD, T2DM, HTN, history of CHFpEF, ongoing tobacco use, chronic hypoxic respiratory failure on 2 L nasal cannula at home admitted on 08/12/2023 with acute on chronic hypoxic respiratory failure due to combination of COPD and diastolic CHF exacerbation in the setting of severe pulmonary hypertension   physician who gave her medication for fluid but this has not significantly improved her breathing.  She was brought to the ED where she was noted to have a high BNP of 871 and had mild hyponatremia.  Patient also had significant oxygen  requirement and was placed on 15 L high flow nasal cannula to maintain her sats above 85%.    -Assessment and Plan: 1) Acute on Chronic diastolic (congestive) heart failure (HCC) Admitted with worsening lower extremity edema and elevated BNP  -echo from 08/13/2023 with EF of 60 to 65%, grade 1 diastolic dysfunction and severe pulmonary hypertension, no aortic stenosis  - Continue IV diuresis - Follow daily weights, and strict I's and O's and low-sodium diet - Continue to check Reds clip measurements and continue prior to admission Jardiance and Vasotec.  2)Tobacco abuse and acute COPD exacerbation--- -cessation counseling provided - Continue nicotine  patch. -continue  IV steroids, bronchodilators, mucolytics and azithromycin  as ordered  3)Acute on chronic respiratory failure with hypoxia (HCC) Patient is usually on 2-3 L at home  -- Patient admits to noncompliance with home O2 due to the need to smoke - She smokes half a pack to 1 pack sometimes. -Cessation counseling provided - At time of admission require up to 15  L high flow nasal cannula; currently down to 7 and further down to 4 L to maintain saturation above 90%. - Will continue treatment with IV diuresis, steroids and antibiotics - Acute recurrent process secondary to CHF and COPD exacerbation.  4)Hyponatremia -History of chronic hyponatremia with baseline usually around 130; presumably component of hypervolemia in the setting of CHF exacerbation. - denies significant beer or alcohol intake - Continue IV Lasix  for another 24 hours. - Continue to follow electrolytes trend.  5) severe pulmonary hypertension--in the setting of COPD -Please see echo report - Continue management as above  6)Chronic iron deficiency anemia -No overt bleeding appreciated - Hemoglobin above 9 - Continue to follow hemoglobin trend intermittently.  7)HLD (hyperlipidemia) -Continue atorvastatin   8)HTN (hypertension) -Continue current antihypertensive agents -continue IV diuretics - Low-sodium diet discussed with patient.  9)DM2--A1c 6.3 reflecting excellent diabetic control PTA -Will continue Jardiance -Continue to hold metformin  - Follow CBG fluctuation - Continue sliding scale insulin .  10) generalized weakness and deconditioning-----physical therapy recommends SNF rehab - Patient in agreement with short-term rehabilitation. - TOC has been made aware of medical readiness anticipated in the next 24-48 hours.  Status is: Inpatient   Disposition: The patient is from: Skilled nursing facility              Anticipated d/c is to: SNF              Anticipated d/c date is: 1-2 days; TOC working with [employer] for authorization  Patient currently is not medically stable to d/c.  Anticipated medical stability on 08/16/2023.  Barriers: Not Clinically Stable-   Code Status :  -  Code Status: Full Code   Family Communication:   (patient is alert, awake and coherent)   DVT Prophylaxis  :   - SCDs   enoxaparin  (LOVENOX ) injection 40 mg Start:  08/12/23 2200   Lab Results  Component Value Date   PLT 382 08/12/2023    Inpatient Medications  Scheduled Meds:  amLODipine   5 mg Oral Daily   arformoterol   15 mcg Nebulization BID   aspirin  EC  325 mg Oral q morning   atorvastatin   10 mg Oral Daily   budesonide  (PULMICORT ) nebulizer solution  0.5 mg Nebulization BID   Chlorhexidine  Gluconate Cloth  6 each Topical Q0600   dextromethorphan -guaiFENesin   1 tablet Oral BID   empagliflozin  25 mg Oral Daily   enalapril  2.5 mg Oral Daily   enoxaparin  (LOVENOX ) injection  40 mg Subcutaneous Q24H   furosemide   60 mg Intravenous Q12H   insulin  aspart  0-9 Units Subcutaneous TID WC   methylPREDNISolone  (SOLU-MEDROL ) injection  40 mg Intravenous Q12H   nicotine   21 mg Transdermal Daily   pantoprazole   80 mg Oral Q1200   [START ON 08/16/2023] pneumococcal 20-valent conjugate vaccine  0.5 mL Intramuscular Tomorrow-1000   potassium chloride   20 mEq Oral Daily   sodium chloride  flush  3 mL Intravenous Q12H   sucralfate   1 g Oral TID WC & HS   Continuous Infusions: PRN Meds:.acetaminophen  **OR** acetaminophen , albuterol , metoprolol tartrate, polyethylene glycol, sodium chloride  flush   Anti-infectives (From admission, onward)    Start     Dose/Rate Route Frequency Ordered Stop   08/13/23 1930  azithromycin  (ZITHROMAX ) tablet 500 mg        500 mg Oral Daily 08/13/23 1842 08/15/23 0916         Subjective: Debbie Mullins chronically ill in appearance; no chest pain, no nausea, no vomiting.  Expressing feeling short winded with activity.  Still mild signs of fluid overload on exam.  Good urine output reported.  Objective: Vitals:   08/15/23 0700 08/15/23 0740 08/15/23 0800 08/15/23 1036  BP: (!) 136/59   109/63  Pulse: 73   81  Resp: 13   17  Temp:   (!) 97.5 F (36.4 C) 98 F (36.7 C)  TempSrc:   Oral   SpO2: 90% 99%  94%  Weight:      Height:        Intake/Output Summary (Last 24 hours) at 08/15/2023 1614 Last data filed  at 08/15/2023 1000 Gross per 24 hour  Intake --  Output 1950 ml  Net -1950 ml   Filed Weights   08/13/23 0421 08/14/23 0421 08/15/23 0500  Weight: 81.8 kg 78.2 kg 75.5 kg    Physical Exam General exam: Alert, awake, oriented x 3; reports improvement in her breathing; short winded sensation with activity has been reported. Respiratory system: Mild expiratory wheezing on exam.  Decreased breath sounds at the bases. Cardiovascular system:RRR. No rubs or gallops; no JVD on exam. Gastrointestinal system: Abdomen is nondistended, soft and nontender. No organomegaly or masses felt. Normal bowel sounds heard. Central nervous system: Generally weak.  No focal neurological deficits. Extremities: No cyanosis or clubbing.  Trace edema appreciated bilaterally. Skin: No petechiae. Psychiatry: Judgement and insight appear normal. Mood & affect appropriate.   Data Reviewed: I have personally reviewed following labs and imaging studies  CBC:  Recent Labs  Lab 08/12/23 1341  WBC 7.7  HGB 9.6*  HCT 32.8*  MCV 77.9*  PLT 382   Basic Metabolic Panel: Recent Labs  Lab 08/12/23 1341 08/13/23 0501 08/15/23 0432  NA 128* 126* 125*  K 4.5 4.7 4.2  CL 93* 91* 81*  CO2 25 27 29   GLUCOSE 139* 81 131*  BUN 22 24* 26*  CREATININE 1.01* 0.90 0.96  CALCIUM  8.3* 8.3* 8.2*  MG 1.6*  --   --    GFR: Estimated Creatinine Clearance: 56.1 mL/min (by C-G formula based on SCr of 0.96 mg/dL).  Recent Results (from the past 240 hours)  MRSA Next Gen by PCR, Nasal     Status: None   Collection Time: 08/12/23  5:55 PM   Specimen: Nasal Mucosa; Nasal Swab  Result Value Ref Range Status   MRSA by PCR Next Gen NOT DETECTED NOT DETECTED Final    Comment: (NOTE) The GeneXpert MRSA Assay (FDA approved for NASAL specimens only), is one component of a comprehensive MRSA colonization surveillance program. It is not intended to diagnose MRSA infection nor to guide or monitor treatment for MRSA infections. Test  performance is not FDA approved in patients less than 28 years old. Performed at [facility], [address], [place], [place] [address]      Radiology Studies: No results found.  Scheduled Meds:  amLODipine   5 mg Oral Daily   arformoterol   15 mcg Nebulization BID   aspirin  EC  325 mg Oral q morning   atorvastatin   10 mg Oral Daily   budesonide  (PULMICORT ) nebulizer solution  0.5 mg Nebulization BID   Chlorhexidine  Gluconate Cloth  6 each Topical Q0600   dextromethorphan -guaiFENesin   1 tablet Oral BID   empagliflozin  25 mg Oral Daily   enalapril  2.5 mg Oral Daily   enoxaparin  (LOVENOX ) injection  40 mg Subcutaneous Q24H   furosemide   60 mg Intravenous Q12H   insulin  aspart  0-9 Units Subcutaneous TID WC   methylPREDNISolone  (SOLU-MEDROL ) injection  40 mg Intravenous Q12H   nicotine   21 mg Transdermal Daily   pantoprazole   80 mg Oral Q1200   [START ON 08/16/2023] pneumococcal 20-valent conjugate vaccine  0.5 mL Intramuscular Tomorrow-1000   potassium chloride   20 mEq Oral Daily   sodium chloride  flush  3 mL Intravenous Q12H   sucralfate   1 g Oral TID WC & HS   Continuous Infusions:   LOS: 3 days    Justina Oman M.D on 08/15/2023 at 4:14 PM  Go to [email or web address] - for contact info  [facility] - Office  [phone]  If 7PM-7AM, please contact night-coverage [email or web address] 08/15/2023, 4:14 PM

## 2023-08-16 DIAGNOSIS — I5033 Acute on chronic diastolic (congestive) heart failure: Secondary | ICD-10-CM | POA: Diagnosis not present

## 2023-08-16 LAB — MAGNESIUM: Magnesium: 1.6 mg/dL — ABNORMAL LOW (ref 1.7–2.4)

## 2023-08-16 LAB — BASIC METABOLIC PANEL WITH GFR
Anion gap: 11 (ref 5–15)
BUN: 30 mg/dL — ABNORMAL HIGH (ref 8–23)
CO2: 32 mmol/L (ref 22–32)
Calcium: 8.5 mg/dL — ABNORMAL LOW (ref 8.9–10.3)
Chloride: 84 mmol/L — ABNORMAL LOW (ref 98–111)
Creatinine, Ser: 0.94 mg/dL (ref 0.44–1.00)
GFR, Estimated: >60 mL/min (ref >60)
Glucose, Bld: 123 mg/dL — ABNORMAL HIGH (ref 70–99)
Potassium: 4.4 mmol/L (ref 3.5–5.1)
Sodium: 127 mmol/L — ABNORMAL LOW (ref 135–145)

## 2023-08-16 LAB — GLUCOSE, CAPILLARY
Glucose-Capillary: 126 mg/dL — ABNORMAL HIGH (ref 70–99)
Glucose-Capillary: 179 mg/dL — ABNORMAL HIGH (ref 70–99)
Glucose-Capillary: 202 mg/dL — ABNORMAL HIGH (ref 70–99)
Glucose-Capillary: 239 mg/dL — ABNORMAL HIGH (ref 70–99)

## 2023-08-16 MED ORDER — ORAL CARE MOUTH RINSE: 15.0000 mL | OROMUCOSAL | Status: DC | PRN

## 2023-08-16 MED ORDER — METHYLPREDNISOLONE SODIUM SUCC 40 MG IJ SOLR
40.0000 mg | Freq: Every day | INTRAMUSCULAR | Status: DC
  Administered 2023-08-17 – 2023-08-18 (×2): 40 mg via INTRAVENOUS
  Filled 2023-08-16 (×2): qty 1

## 2023-08-16 MED ORDER — SPIRONOLACTONE 12.5 MG HALF TABLET
12.5000 mg | ORAL_TABLET | Freq: Every day | ORAL | Status: DC
  Administered 2023-08-16 – 2023-08-19 (×4): 12.5 mg via ORAL
  Filled 2023-08-16 (×4): qty 1

## 2023-08-16 NOTE — Progress Notes (Signed)
 Mobility Specialist Progress Note:    08/16/23 0925  Mobility  Activity Transferred from bed to chair;Stood at bedside  Level of Assistance Minimal assist, patient does 75% or more  Assistive Device Front wheel walker  Distance Ambulated (ft) 3 ft  Range of Motion/Exercises Active;All extremities  Activity Response Tolerated well  Mobility Referral Yes  Mobility visit 1 Mobility  Mobility Specialist Start Time (ACUTE ONLY) [id number]  Mobility Specialist Stop Time (ACUTE ONLY) 0944  Mobility Specialist Time Calculation (min) (ACUTE ONLY) 19 min   Pt received in bed, NT requesting assistance with transfer. Required MinA to stand and pivot with RW. Tolerated well,asx throughout. NT in room, all needs met.  Camrie Stock Mobility Specialist Please contact via Special educational needs teacher or  Rehab office at [phone]

## 2023-08-16 NOTE — Progress Notes (Signed)
 Physical Therapy Treatment Patient Details Name: Debbie Mullins MRN: [id number] DOB: [birth year] Today's Date: 08/16/2023   History of Present Illness Debbie Mullins is a 68 y.o. female with medical history significant of  COPD, T2DM, HTN, history of CHFpEF, ongoing tobacco use, chronic hypoxic respiratory failure on 2 L nasal cannula at home who presents with 1 to 90-month history of worsening lower extremity swelling and increased shortness of breath.  She saw her primary care physician who gave her medication for fluid but this has not significantly improved her breathing.  She was brought to the ED where she was noted to have a high BNP of 871 and had mild hyponatremia.  Patient also had significant oxygen  requirement and was placed on 15 L high flow nasal cannula to maintain her sats above 85%.    PT Comments  Patient tolerated PT session well. Patient was received seated in recliner. Agrees to LE exercises in chair. During STS exercise, patient continues to require mod-max A. Max A for initial stand, progressing to mod A with verbal  cueing/education of 1:1 hand placement on armrest and RW handle. Patient takes a few steps at bedside, with one instance of LOB, requiring mod A throughout. Patient is very motivated for making progress towards her goals. Patient will benefit from continued skilled physical therapy acutely and recommended venue in order to return home safely and with improved function/QOL.     If plan is discharge home, recommend the following: A lot of help with bathing/dressing/bathroom;A lot of help with walking and/or transfers;Help with stairs or ramp for entrance;Assistance with cooking/housework   Can travel by private vehicle     No  Equipment Recommendations  None recommended by PT    Recommendations for Other Services       Precautions / Restrictions Precautions Precautions: Fall Recall of Precautions/Restrictions: Intact Restrictions Weight Bearing  Restrictions Per Provider Order: No     Mobility  Bed Mobility               General bed mobility comments: Pt received sitting in recliner    Transfers Overall transfer level: Needs assistance Equipment used: Rolling walker (2 wheels) Transfers: Sit to/from Stand Sit to Stand: Mod assist           General transfer comment: 3 STS during session w/ mod A and RW. Verbal cueing for 1:1 hand placement to aid in transfer. improvements noted    Ambulation/Gait Ambulation/Gait assistance: Mod assist Gait Distance (Feet): 6 Feet Assistive device: Rolling walker (2 wheels) Gait Pattern/deviations: Decreased step length - right, Decreased step length - left, Decreased stride length Gait velocity: Dec     General Gait Details: Ambulation limited to three bouts of continuous forward/backward stepping w RW and mod A for steadying   Stairs             Wheelchair Mobility     Tilt Bed    Modified Rankin (Stroke Patients Only)       Balance Overall balance assessment: Mild deficits observed, not formally tested Sitting-balance support: No upper extremity supported, Feet supported Sitting balance-Leahy Scale: Good Sitting balance - Comments: Good seated in recliner with cueing to lift trunk off back of chair during seated LE exercises   Standing balance support: Reliant on assistive device for balance, During functional activity, Bilateral upper extremity supported Standing balance-Leahy Scale: Poor Standing balance comment: w/ RW and mod A due to unsteadiness  Communication Communication Communication: No apparent difficulties  Cognition Arousal: Alert Behavior During Therapy: WFL for tasks assessed/performed   PT - Cognitive impairments: No apparent impairments                         Following commands: Intact      Cueing Cueing Techniques: Verbal cues  Exercises General Exercises - Lower  Extremity Ankle Circles/Pumps: AROM, Strengthening, Both, 10 reps, Seated Long Arc Quad: AROM, Strengthening, Both, 10 reps, Seated Hip Flexion/Marching: AROM, Strengthening, Both, 10 reps, Seated Other Exercises Other Exercises: 3 sit to stands, w/ RW, mod A    General Comments        Pertinent Vitals/Pain Pain Assessment Pain Assessment: Faces Faces Pain Scale: Hurts little more Pain Location: B LE Pain Descriptors / Indicators: Discomfort Pain Intervention(s): Monitored during session, Limited activity within patient's tolerance, Repositioned    Home Living                          Prior Function            PT Goals (current goals can now be found in the care plan section) Acute Rehab PT Goals Patient Stated Goal: return home after rehab PT Goal Formulation: With patient Time For Goal Achievement: 08/27/23 Potential to Achieve Goals: Good Progress towards PT goals: Progressing toward goals    Frequency    Min 3X/week      PT Plan      Co-evaluation              AM-PAC PT "6 Clicks" Mobility   Outcome Measure  Help needed turning from your back to your side while in a flat bed without using bedrails?: A Little Help needed moving from lying on your back to sitting on the side of a flat bed without using bedrails?: A Lot Help needed moving to and from a bed to a chair (including a wheelchair)?: A Lot Help needed standing up from a chair using your arms (e.g., wheelchair or bedside chair)?: A Lot Help needed to walk in hospital room?: A Lot Help needed climbing 3-5 steps with a railing? : Total 6 Click Score: 12    End of Session Equipment Utilized During Treatment: Oxygen  Activity Tolerance: Patient tolerated treatment well Patient left: in chair;with call bell/phone within reach   PT Visit Diagnosis: Unsteadiness on feet (R26.81);Other abnormalities of gait and mobility (R26.89);Muscle weakness (generalized) (M62.81)     Time:  1884-1660 PT Time Calculation (min) (ACUTE ONLY): 11 min  Charges:    $Therapeutic Exercise: 8-22 mins PT General Charges $$ ACUTE PT VISIT: 1 Visit                     3:17 PM, 08/16/23 Argie Lober Powell-Butler, PT, DPT [place] with [facility]

## 2023-08-16 NOTE — Hospital Course (Signed)
 Physical Exam  Constitutional: In no distress.  Cardiovascular: Normal rate, regular rhythm. Trace edema of bilateral lower extremity edema Elevated JVP Pulmonary: Non labored breathing on 4L [place] , no wheezing. Rales in bases.  Abdominal: Soft. Normal bowel sounds. Non distended and non tender Musculoskeletal: Normal range of motion.     Neurological: Alert and oriented to person, place, and time. Non focal  Skin: Skin is warm and dry.

## 2023-08-16 NOTE — Plan of Care (Signed)
   Problem: Education: Goal: Knowledge of General Education information will improve Description: Including pain rating scale, medication(s)/side effects and non-pharmacologic comfort measures Outcome: Progressing   Problem: Clinical Measurements: Goal: Ability to maintain clinical measurements within normal limits will improve Outcome: Progressing   Problem: Activity: Goal: Risk for activity intolerance will decrease Outcome: Progressing   Problem: Nutrition: Goal: Adequate nutrition will be maintained Outcome: Progressing   Problem: Pain Managment: Goal: General experience of comfort will improve and/or be controlled Outcome: Progressing   Problem: Safety: Goal: Ability to remain free from injury will improve Outcome: Progressing   Problem: Skin Integrity: Goal: Risk for impaired skin integrity will decrease Outcome: Progressing

## 2023-08-16 NOTE — Care Management Important Message (Signed)
 Important Message  Patient Details  Name: Debbie Mullins MRN: [id number] Date of Birth: [birth year]   Important Message Given:  Yes - Medicare IM     Kaiden Pech L Damariz Paganelli 08/16/2023, 2:15 PM

## 2023-08-16 NOTE — Progress Notes (Signed)
 PROGRESS NOTE   Debbie Mullins, is a 68 y.o. female, DOB - January 07, 1956, [id number]  Admit date - 08/12/2023   Admitting Physician Provider Default, MD  Outpatient Primary MD for the patient is [name], MD  LOS - 4  Chief Complaint  Patient presents with   Congestive Heart Failure        Brief Narrative:  68 year old with history of COPD, T2DM, HTN, history of CHFpEF, ongoing tobacco use, chronic hypoxic respiratory failure on 2 L nasal cannula at home admitted on 08/12/2023 with acute on chronic hypoxic respiratory failure due to combination of COPD and diastolic CHF exacerbation in the setting of severe pulmonary hypertension   physician who gave her medication for fluid but this has not significantly improved her breathing.  She was brought to the ED where she was noted to have a high BNP of 871 and had mild hyponatremia.  Patient also had significant oxygen  requirement and was placed on 15 L high flow nasal cannula to maintain her sats above 85%.    -Assessment and Plan: 1) Acute on Chronic diastolic (congestive) heart failure (HCC) Admitted with worsening lower extremity edema and elevated BNP, patient notes she sometimes drinks more fluids than usual and she will sometimes eat more salt.   -echo from 08/13/2023 with EF of 60 to 65%, grade 1 diastolic dysfunction and severe pulmonary hypertension, no aortic stenosis  - Continue IV diuresis - Wt down ~9lbs, with ~3L of UOP - Follow daily weights, and strict I's and O's and low-sodium diet - Continue to check Reds clip measurements and continue prior to admission Jardiance and Vasotec. Will add low dose Spironolactone   2)Tobacco use disorder and acute COPD exacerbation--- -cessation counseling provided -s/p 3d of azithromycin  - Continue nicotine  patch. -continue  IV steroids transition to daily dosing, bronchodilators, mucolytics   3)Acute on chronic respiratory failure with hypoxia (HCC) Patient is usually on 2-3 L at  home. Secondary to above.  -- Patient admits to noncompliance with home O2 due to the need to smoke - She smokes half a pack to 1 pack sometimes. -Cessation counseling provided - At time of admission require up to 15 L high flow nasal cannula; currently down to 7 and further down to 4 L to maintain saturation above 90%. - Will continue treatment with IV diuresis, steroids  - Acute recurrent process secondary to CHF and COPD exacerbation.   4)Hyponatremia -History of chronic hyponatremia with baseline usually around 130; presumably component of hypervolemia in the setting of CHF exacerbation. - denies significant alcohol intake - Stable.  - Continue IV Lasix   - Continue to follow electrolytes trend.  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 Hgb electrophoresis WNL. Pt receiving iron and follow.  - Hemoglobin above 9 - F/u iron studies   7)HLD (hyperlipidemia) -Continue atorvastatin   8)HTN (hypertension) -Continue current antihypertensive agents - Low-sodium diet discussed with patient.  9)DM2--A1c 6.3 reflecting excellent diabetic control PTA -Will continue Jardiance -Continue to hold metformin  - Follow CBG fluctuation - Continue sliding scale insulin .  10) generalized weakness and deconditioning-----physical therapy recommends SNF rehab - Patient in agreement with short-term rehabilitation. - TOC has been made aware of medical readiness anticipated in the next 24-48 hours.  Elevated  BUN  Noted. In setting of steroid use.   H/o pulmonary nodules  RML nodule. Follows with pulmonology   Status is: Inpatient   Disposition: The patient is from: Skilled nursing facility               Anticipated d/c is to: SNF              Anticipated d/c date is: 1-2 days; TOC working with [employer] for authorization              Patient currently is not medically stable to d/c.  Continued IV diuresis   Barriers: Not Clinically Stable-   Code Status :  -  Code Status: Full Code   Family Communication:   (patient is alert, awake and coherent)   DVT Prophylaxis  :   - SCDs   enoxaparin  (LOVENOX ) injection 40 mg Start: 08/12/23 2200   Lab Results  Component Value Date   PLT 382 08/12/2023    Inpatient Medications  Scheduled Meds:  amLODipine   5 mg Oral Daily   arformoterol   15 mcg Nebulization BID   aspirin  EC  325 mg Oral q morning   atorvastatin   10 mg Oral Daily   budesonide  (PULMICORT ) nebulizer solution  0.5 mg Nebulization BID   dextromethorphan -guaiFENesin   1 tablet Oral BID   empagliflozin  25 mg Oral Daily   enalapril  2.5 mg Oral Daily   enoxaparin  (LOVENOX ) injection  40 mg Subcutaneous Q24H   furosemide   60 mg Intravenous Q12H   insulin  aspart  0-9 Units Subcutaneous TID WC   methylPREDNISolone  (SOLU-MEDROL ) injection  40 mg Intravenous Q12H   nicotine   21 mg Transdermal Daily   pantoprazole   80 mg Oral Q1200   pneumococcal 20-valent conjugate vaccine  0.5 mL Intramuscular Tomorrow-1000   sodium chloride  flush  3 mL Intravenous Q12H   spironolactone  12.5 mg Oral Daily   sucralfate   1 g Oral TID WC & HS   Continuous Infusions: PRN Meds:.acetaminophen  **OR** acetaminophen , albuterol , metoprolol tartrate, mouth rinse, polyethylene glycol, sodium chloride  flush   Anti-infectives (From admission, onward)    Start     Dose/Rate Route Frequency Ordered Stop   08/13/23 1930  azithromycin  (ZITHROMAX ) tablet 500 mg        500 mg Oral Daily 08/13/23 1842 08/15/23 1610         Subjective: Debbie Mullins states sob is improving. Noticed that leg swelling is also improving. No chest pain.   Objective: Vitals:   08/16/23 0521 08/16/23 0836 08/16/23  0902 08/16/23 1433  BP: 131/64  (!) 122/59 116/66  Pulse: 72   65  Resp: 19   17  Temp: 97.6 F (36.4 C)   98 F (36.7 C)  TempSrc: Oral     SpO2: 93% 92%  94%  Weight:      Height:        Intake/Output Summary (Last 24 hours) at 08/16/2023 1638 Last data filed at 08/16/2023 1300 Gross per 24 hour  Intake 600 ml  Output 2100 ml  Net -1500 ml   Filed Weights   08/14/23 0421 08/15/23 0500 08/16/23 0500  Weight: 78.2 kg 75.5 kg 71.3 kg     Physical Exam  Constitutional: In no distress.  Cardiovascular: Normal rate, regular rhythm. No lower extremity edema. Elevated JVP  Pulmonary: Non labored breathing on [place], no wheezing or rales.   Abdominal: Soft. Normal bowel sounds. Non distended and non tender Musculoskeletal: Normal range of motion.  Neurological: Alert and oriented to person, place, and time. Non focal  Skin: Skin is warm and dry.    Data Reviewed: I have personally reviewed following labs and imaging studies  CBC: Recent Labs  Lab 08/12/23 1341  WBC 7.7  HGB 9.6*  HCT 32.8*  MCV 77.9*  PLT 382   Basic Metabolic Panel: Recent Labs  Lab 08/12/23 1341 08/13/23 0501 08/15/23 0432 08/16/23 0501  NA 128* 126* 125* 127*  K 4.5 4.7 4.2 4.4  CL 93* 91* 81* 84*  CO2 25 27 29  32  GLUCOSE 139* 81 131* 123*  BUN 22 24* 26* 30*  CREATININE 1.01* 0.90 0.96 0.94  CALCIUM  8.3* 8.3* 8.2* 8.5*  MG 1.6*  --   --   --    GFR: Estimated Creatinine Clearance: 55.8 mL/min (by C-G formula based on SCr of 0.94 mg/dL).  Recent Results (from the past 240 hours)  MRSA Next Gen by PCR, Nasal     Status: None   Collection Time: 08/12/23  5:55 PM   Specimen: Nasal Mucosa; Nasal Swab  Result Value Ref Range Status   MRSA by PCR Next Gen NOT DETECTED NOT DETECTED Final    Comment: (NOTE) The GeneXpert MRSA Assay (FDA approved for NASAL specimens only), is one component of a comprehensive MRSA colonization surveillance program. It is not intended to diagnose MRSA  infection nor to guide or monitor treatment for MRSA infections. Test performance is not FDA approved in patients less than 54 years old. Performed at [facility], [address], [place], [place] [address]      Radiology Studies: No results found.  Scheduled Meds:  amLODipine   5 mg Oral Daily   arformoterol   15 mcg Nebulization BID   aspirin  EC  325 mg Oral q morning   atorvastatin   10 mg Oral Daily   budesonide  (PULMICORT ) nebulizer solution  0.5 mg Nebulization BID   dextromethorphan -guaiFENesin   1 tablet Oral BID   empagliflozin  25 mg Oral Daily   enalapril  2.5 mg Oral Daily   enoxaparin  (LOVENOX ) injection  40 mg Subcutaneous Q24H   furosemide   60 mg Intravenous Q12H   insulin  aspart  0-9 Units Subcutaneous TID WC   methylPREDNISolone  (SOLU-MEDROL ) injection  40 mg Intravenous Q12H   nicotine   21 mg Transdermal Daily   pantoprazole   80 mg Oral Q1200   pneumococcal 20-valent conjugate vaccine  0.5 mL Intramuscular Tomorrow-1000   sodium chloride  flush  3 mL Intravenous Q12H   spironolactone  12.5 mg Oral Daily   sucralfate   1 g Oral TID WC & HS   Continuous Infusions:   LOS: 4 days    Joette Mustard M.D on 08/16/2023 at 4:38 PM  Go to [email or web address] - for contact info  [facility] - Office  [phone]  If 7PM-7AM, please contact night-coverage [email or web address] 08/16/2023, 4:38 PM

## 2023-08-16 NOTE — TOC Progression Note (Signed)
 Transition of Care [facility]) - Progression Note    Patient Details  Name: LORRAIN RIVERS MRN: [id number] Date of Birth: 1955/06/22  Transition of [facility]) CM/SW Contact  Orelia Binet, RN Phone Number: 08/16/2023, 9:56 AM  Clinical Narrative:   Insurance Auth Received, not ready to discharge today, [facility] updated. [id number],  08/15/2023-08/19/2023   Expected Discharge Plan: Skilled Nursing Facility Barriers to Discharge: Continued Medical Work up  Expected Discharge Plan and Services       Living arrangements for the past 2 months: Single Family Home                                       Social Determinants of Health (SDOH) Interventions SDOH Screenings   Food Insecurity: No Food Insecurity (08/15/2023)  Housing: Low Risk  (08/15/2023)  Transportation Needs: No Transportation Needs (08/15/2023)  Utilities: Not At Risk (08/12/2023)  Social Connections: Unknown (08/12/2023)  Tobacco Use: High Risk (08/15/2023)    Readmission Risk Interventions    08/14/2023    9:50 AM  Readmission Risk Prevention Plan  Transportation Screening Complete  PCP or Specialist Appt within 5-7 Days Not Complete  Home Care Screening Complete  Medication Review (RN CM) Complete

## 2023-08-17 DIAGNOSIS — I5033 Acute on chronic diastolic (congestive) heart failure: Secondary | ICD-10-CM | POA: Diagnosis not present

## 2023-08-17 LAB — CBC
HCT: 29.9 % — ABNORMAL LOW (ref 36.0–46.0)
Hemoglobin: 9 g/dL — ABNORMAL LOW (ref 12.0–15.0)
MCH: 22.7 pg — ABNORMAL LOW (ref 26.0–34.0)
MCHC: 30.1 g/dL (ref 30.0–36.0)
MCV: 75.3 fL — ABNORMAL LOW (ref 80.0–100.0)
Platelets: 300 10*3/uL (ref 150–400)
RBC: 3.97 MIL/uL (ref 3.87–5.11)
RDW: 20.3 % — ABNORMAL HIGH (ref 11.5–15.5)
WBC: 7.1 10*3/uL (ref 4.0–10.5)
nRBC: 0.3 % — ABNORMAL HIGH (ref 0.0–0.2)

## 2023-08-17 LAB — MAGNESIUM: Magnesium: 1.6 mg/dL — ABNORMAL LOW (ref 1.7–2.4)

## 2023-08-17 LAB — BASIC METABOLIC PANEL WITH GFR
Anion gap: 11 (ref 5–15)
BUN: 36 mg/dL — ABNORMAL HIGH (ref 8–23)
CO2: 36 mmol/L — ABNORMAL HIGH (ref 22–32)
Calcium: 8.4 mg/dL — ABNORMAL LOW (ref 8.9–10.3)
Chloride: 84 mmol/L — ABNORMAL LOW (ref 98–111)
Creatinine, Ser: 1.01 mg/dL — ABNORMAL HIGH (ref 0.44–1.00)
GFR, Estimated: >60 mL/min (ref >60)
Glucose, Bld: 93 mg/dL (ref 70–99)
Potassium: 4 mmol/L (ref 3.5–5.1)
Sodium: 131 mmol/L — ABNORMAL LOW (ref 135–145)

## 2023-08-17 LAB — FERRITIN: Ferritin: 11 ng/mL (ref 11–307)

## 2023-08-17 LAB — GLUCOSE, CAPILLARY
Glucose-Capillary: 107 mg/dL — ABNORMAL HIGH (ref 70–99)
Glucose-Capillary: 140 mg/dL — ABNORMAL HIGH (ref 70–99)
Glucose-Capillary: 285 mg/dL — ABNORMAL HIGH (ref 70–99)
Glucose-Capillary: 116 mg/dL — ABNORMAL HIGH (ref 70–99)

## 2023-08-17 LAB — IRON AND TIBC
Iron: 15 ug/dL — ABNORMAL LOW (ref 28–170)
Saturation Ratios: 3 % — ABNORMAL LOW (ref 10.4–31.8)
TIBC: 452 ug/dL — ABNORMAL HIGH (ref 250–450)
UIBC: 437 ug/dL

## 2023-08-17 MED ORDER — DICLOFENAC SODIUM 1 % EX GEL
2.0000 g | Freq: Four times a day (QID) | CUTANEOUS | Status: DC
  Administered 2023-08-17 – 2023-08-19 (×7): 2 g via TOPICAL
  Filled 2023-08-17 (×2): qty 100

## 2023-08-17 MED ORDER — MAGNESIUM SULFATE 2 GM/50ML IV SOLN
2.0000 g | Freq: Once | INTRAVENOUS | Status: AC
  Administered 2023-08-17: 2 g via INTRAVENOUS
  Filled 2023-08-17: qty 50

## 2023-08-17 MED ORDER — INSULIN GLARGINE-YFGN 100 UNIT/ML ~~LOC~~ SOLN
5.0000 [IU] | Freq: Every day | SUBCUTANEOUS | Status: DC
  Administered 2023-08-17 – 2023-08-18 (×2): 5 [IU] via SUBCUTANEOUS
  Filled 2023-08-17 (×3): qty 0.05

## 2023-08-17 MED ORDER — INSULIN GLARGINE-YFGN 100 UNIT/ML ~~LOC~~ SOPN: 5.0000 [IU] | PEN_INJECTOR | Freq: Every day | SUBCUTANEOUS | Status: DC

## 2023-08-17 MED ORDER — SODIUM CHLORIDE 0.9 % IV SOLN
300.0000 mg | INTRAVENOUS | Status: DC
  Administered 2023-08-17 – 2023-08-18 (×2): 300 mg via INTRAVENOUS
  Filled 2023-08-17 (×3): qty 15

## 2023-08-17 MED ORDER — ACETAZOLAMIDE 250 MG PO TABS
500.0000 mg | ORAL_TABLET | Freq: Once | ORAL | Status: AC
  Administered 2023-08-17: 500 mg via ORAL
  Filled 2023-08-17: qty 2

## 2023-08-17 NOTE — Plan of Care (Signed)

## 2023-08-17 NOTE — Progress Notes (Signed)
 Patient ambulated with assistance to chair, attempted to get patients weight standing. Patient refused an stated that's just to much they can do it tonight. Unable to do REDS clip at this time due to not having patients weight. MD [place] made aware.

## 2023-08-17 NOTE — Progress Notes (Signed)
 PROGRESS NOTE   Debbie Mullins, is a 68 y.o. female, DOB - 1955/08/22, [id number]  Admit date - 08/12/2023   Admitting Physician Provider Default, MD  Outpatient Primary MD for the patient is [name], MD  LOS - 5  Chief Complaint  Patient presents with   Congestive Heart Failure        Brief Narrative:  68 year old with history of COPD, T2DM, HTN, history of CHFpEF, ongoing tobacco use, chronic hypoxic respiratory failure on 2 L nasal cannula at home admitted on 08/12/2023 with acute on chronic hypoxic respiratory failure due to combination of COPD and diastolic CHF exacerbation in the setting of severe pulmonary hypertension   physician who gave her medication for fluid but this has not significantly improved her breathing.  She was brought to the ED where she was noted to have a high BNP of 871 and had mild hyponatremia.  Patient also had significant oxygen  requirement and was placed on 15 L high flow nasal cannula to maintain her sats above 85%.    -Assessment and Plan: 1) Acute on Chronic diastolic (congestive) heart failure (HCC) Admitted with worsening lower extremity edema and elevated BNP, patient notes she sometimes drinks more fluids than usual and she will sometimes eat more salt.   -echo from 08/13/2023 with EF of 60 to 65%, grade 1 diastolic dysfunction and severe pulmonary hypertension, no aortic stenosis  - Continue IV diuresis, approaching euvolemia  - 2.1 L of UOP - Follow daily weights, and strict I's and O's and low-sodium diet - Continue to check Reds clip measurements and continue prior to admission Jardiance  and Vasotec . Will add low dose Spironolactone    2)Tobacco use disorder and acute COPD exacerbation--- -cessation counseling provided -s/p 3d of azithromycin  - Continue nicotine  patch. -continue  IV steroids with daily dosing, bronchodilators, mucolytics   3)Acute on chronic respiratory failure with hypoxia (HCC) Patient is usually on 2-3 L at  home. Secondary to above.  -- Patient admits to noncompliance with home O2 due to the need to smoke - She smokes half a pack to 1 pack sometimes. -Cessation counseling provided - At time of admission require up to 15 L high flow nasal cannula; currently down to 7 and further down to 4 L to maintain saturation above 90%. - Will continue treatment with IV diuresis, steroids     4)Hyponatremia Improving  -History of chronic hyponatremia with baseline usually around 130; presumably component of hypervolemia in the setting of CHF exacerbation. - denies significant alcohol intake - Stable.  - Continue IV Lasix   - Continue to follow electrolytes trend.  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 Hgb electrophoresis WNL. Pt receiving iron and follow.  - Hemoglobin stable -Iron/TIBC/Ferritin/ %Sat    Component Value Date/Time   IRON 15 (L) 08/17/2023 0658   TIBC 452 (H) 08/17/2023 0658   FERRITIN 11 08/17/2023 0658   IRONPCTSAT 3 (L) 08/17/2023 5621  -Start IV Fe while inpatient   7)HLD (hyperlipidemia) -Continue atorvastatin   8)HTN (hypertension) -Controlled -Continue current antihypertensive agents - Low-sodium diet discussed with patient.  9)DM2--A1c 6.3 reflecting excellent diabetic control PTA -Will continue Jardiance  -Continue to hold metformin  - Follow CBGs - Start 5u long acting + Continue sliding scale insulin .  10) generalized weakness and deconditioning-----physical therapy recommends SNF rehab - Patient in agreement with short-term rehabilitation. - TOC has been made aware of medical readiness anticipated in the next 24-48 hours.  Metabolic acidosis  Contraction alkalosis in  the setting of diuresis  1x dose of acetazolamide while continue IV diuresis   Elevated BUN  Noted. In setting of steroid use.   H/o pulmonary nodules  RML nodule. Follows with pulmonology   Status is: Inpatient   Disposition: The patient is from: Skilled nursing facility              Anticipated d/c is to: SNF              Anticipated d/c date is: 1-2 days; TOC working with [employer] for authorization              Patient currently is not medically stable to d/c.  Continued IV diuresis   Barriers: Not Clinically Stable-   Code Status :  -  Code Status: Full Code   Family Communication:   (patient is alert, awake and coherent)   DVT Prophylaxis  :   - SCDs   enoxaparin  (LOVENOX ) injection 40 mg Start: 08/12/23 2200   Lab Results  Component Value Date   PLT 300 08/17/2023    Inpatient Medications  Scheduled Meds:  arformoterol   15 mcg Nebulization BID   aspirin  EC  325 mg Oral q morning   atorvastatin   10 mg Oral Daily   budesonide  (PULMICORT ) nebulizer solution  0.5 mg Nebulization BID   dextromethorphan -guaiFENesin   1 tablet Oral BID   empagliflozin   25 mg Oral Daily   enalapril   2.5 mg Oral Daily   enoxaparin  (LOVENOX ) injection  40 mg Subcutaneous Q24H   furosemide   60 mg Intravenous Q12H   insulin  aspart  0-9 Units Subcutaneous TID WC   methylPREDNISolone  (SOLU-MEDROL ) injection  40 mg Intravenous Daily   nicotine   21 mg Transdermal Daily   pantoprazole   80 mg Oral Q1200   pneumococcal 20-valent conjugate vaccine  0.5 mL Intramuscular Tomorrow-1000   sodium chloride  flush  3 mL Intravenous Q12H   spironolactone   12.5 mg Oral Daily   sucralfate   1 g Oral TID WC & HS   Continuous Infusions: PRN Meds:.acetaminophen  **OR** acetaminophen , albuterol , metoprolol  tartrate, mouth rinse, polyethylene glycol, sodium chloride  flush   Anti-infectives (From admission, onward)    Start     Dose/Rate Route Frequency Ordered Stop   08/13/23 1930  azithromycin   (ZITHROMAX ) tablet 500 mg        500 mg Oral Daily 08/13/23 1842 08/15/23 4098         Subjective: Debbie Mullins sob continues to improve No chest pain. Some knee pain .  BM yesterday. On 4L [place]. Wears 4L [place] at home.     Objective: Vitals:   08/16/23 2041 08/17/23 0644 08/17/23 0800 08/17/23 0830  BP: (!) 117/52 (!) 129/58 121/65   Pulse: 80 69    Resp: 20 18    Temp: 98.6 F (37 C) 97.9 F (36.6 C)    TempSrc: Oral Oral    SpO2: 96% 96%  97%  Weight:      Height:        Intake/Output Summary (Last 24 hours) at 08/17/2023 1130 Last data filed at 08/17/2023 1021 Gross per 24 hour  Intake 300 ml  Output 3700 ml  Net -3400 ml   Filed Weights   08/15/23 0500 08/16/23 0500  Weight: 75.5 kg 71.3 kg  Physical Exam  Constitutional: In no distress.  Cardiovascular: Normal rate, regular rhythm. Trace bilateral lower extremity edema  Pulmonary: Non labored breathing on 4L [place], no wheezing, faint  rales.   Abdominal: Soft. Normal bowel sounds. Non distended and non tender Neurological: Alert and oriented to person, place, and time. Non focal  Skin: Skin is warm and dry.    Data Reviewed: I have personally reviewed following labs and imaging studies  CBC: Recent Labs  Lab 08/12/23 1341 08/17/23 0658  WBC 7.7 7.1  HGB 9.6* 9.0*  HCT 32.8* 29.9*  MCV 77.9* 75.3*  PLT 382 300   Basic Metabolic Panel: Recent Labs  Lab 08/12/23 1341 08/13/23 0501 08/15/23 0432 08/16/23 0501 08/17/23 0658  NA 128* 126* 125* 127* 131*  K 4.5 4.7 4.2 4.4 4.0  CL 93* 91* 81* 84* 84*  CO2 25 27 29  32 36*  GLUCOSE 139* 81 131* 123* 93  BUN 22 24* 26* 30* 36*  CREATININE 1.01* 0.90 0.96 0.94 1.01*  CALCIUM  8.3* 8.3* 8.2* 8.5* 8.4*  MG 1.6*  --   --  1.6* 1.6*   GFR: Estimated Creatinine Clearance: 51.9 mL/min (A) (by C-G formula based on SCr of 1.01 mg/dL (H)).  Recent Results (from the past 240 hours)  MRSA Next Gen by PCR, Nasal     Status: None   Collection Time:  08/12/23  5:55 PM   Specimen: Nasal Mucosa; Nasal Swab  Result Value Ref Range Status   MRSA by PCR Next Gen NOT DETECTED NOT DETECTED Final    Comment: (NOTE) The GeneXpert MRSA Assay (FDA approved for NASAL specimens only), is one component of a comprehensive MRSA colonization surveillance program. It is not intended to diagnose MRSA infection nor to guide or monitor treatment for MRSA infections. Test performance is not FDA approved in patients less than 19 years old. Performed at [facility], [address] [place], [place] [address]      Radiology Studies: No results found.  Scheduled Meds:  arformoterol   15 mcg Nebulization BID   aspirin  EC  325 mg Oral q morning   atorvastatin   10 mg Oral Daily   budesonide  (PULMICORT ) nebulizer solution  0.5 mg Nebulization BID   dextromethorphan -guaiFENesin   1 tablet Oral BID   empagliflozin   25 mg Oral Daily   enalapril   2.5 mg Oral Daily   enoxaparin  (LOVENOX ) injection  40 mg Subcutaneous Q24H   furosemide   60 mg Intravenous Q12H   insulin  aspart  0-9 Units Subcutaneous TID WC   methylPREDNISolone  (SOLU-MEDROL ) injection  40 mg Intravenous Daily   nicotine   21 mg Transdermal Daily   pantoprazole   80 mg Oral Q1200   pneumococcal 20-valent conjugate vaccine  0.5 mL Intramuscular Tomorrow-1000   sodium chloride  flush  3 mL Intravenous Q12H   spironolactone   12.5 mg Oral Daily   sucralfate   1 g Oral TID WC & HS   Continuous Infusions:   LOS: 5 days    Joette Mustard M.D on 08/17/2023 at 11:30 AM  Go to [email or web address] - for contact info  [facility] - Office  [phone]  If 7PM-7AM, please contact night-coverage [email or web address] 08/17/2023, 11:30 AM

## 2023-08-17 NOTE — Plan of Care (Signed)
  Problem: Education: Goal: Knowledge of General Education information will improve Description: Including pain rating scale, medication(s)/side effects and non-pharmacologic comfort measures Outcome: Progressing   Problem: Clinical Measurements: Goal: Ability to maintain clinical measurements within normal limits will improve Outcome: Progressing   Problem: Pain Managment: Goal: General experience of comfort will improve and/or be controlled Outcome: Progressing   Problem: Fluid Volume: Goal: Ability to maintain a balanced intake and output will improve Outcome: Progressing

## 2023-08-18 DIAGNOSIS — I5033 Acute on chronic diastolic (congestive) heart failure: Secondary | ICD-10-CM | POA: Diagnosis not present

## 2023-08-18 LAB — GLUCOSE, CAPILLARY
Glucose-Capillary: 75 mg/dL (ref 70–99)
Glucose-Capillary: 172 mg/dL — ABNORMAL HIGH (ref 70–99)
Glucose-Capillary: 192 mg/dL — ABNORMAL HIGH (ref 70–99)
Glucose-Capillary: 170 mg/dL — ABNORMAL HIGH (ref 70–99)

## 2023-08-18 LAB — BASIC METABOLIC PANEL WITH GFR
Anion gap: 9 (ref 5–15)
BUN: 39 mg/dL — ABNORMAL HIGH (ref 8–23)
CO2: 38 mmol/L — ABNORMAL HIGH (ref 22–32)
Calcium: 8.3 mg/dL — ABNORMAL LOW (ref 8.9–10.3)
Chloride: 83 mmol/L — ABNORMAL LOW (ref 98–111)
Creatinine, Ser: 1.15 mg/dL — ABNORMAL HIGH (ref 0.44–1.00)
GFR, Estimated: 53 mL/min — ABNORMAL LOW (ref >60)
Glucose, Bld: 63 mg/dL — ABNORMAL LOW (ref 70–99)
Potassium: 3.5 mmol/L (ref 3.5–5.1)
Sodium: 130 mmol/L — ABNORMAL LOW (ref 135–145)

## 2023-08-18 LAB — MAGNESIUM: Magnesium: 2.1 mg/dL (ref 1.7–2.4)

## 2023-08-18 MED ORDER — FUROSEMIDE 40 MG PO TABS
40.0000 mg | ORAL_TABLET | Freq: Every day | ORAL | Status: DC
  Administered 2023-08-19: 40 mg via ORAL
  Filled 2023-08-18: qty 1

## 2023-08-18 MED ORDER — METHYLPREDNISOLONE SODIUM SUCC 40 MG IJ SOLR
20.0000 mg | Freq: Every day | INTRAMUSCULAR | Status: DC
  Administered 2023-08-19: 20 mg via INTRAVENOUS
  Filled 2023-08-18: qty 1

## 2023-08-18 MED ORDER — FUROSEMIDE 20 MG PO TABS: 20.0000 mg | ORAL_TABLET | Freq: Every day | ORAL | Status: DC

## 2023-08-18 NOTE — Progress Notes (Signed)
 PROGRESS NOTE   Debbie Mullins, is a 68 y.o. female, DOB - 01-13-1956, ZOX:[id number]  Admit date - 08/12/2023   Admitting Physician Provider Default, MD  Outpatient Primary MD for the patient is [name], MD  LOS - 6  Chief Complaint  Patient presents with   Congestive Heart Failure        Brief Narrative:  68 year old with history of COPD, T2DM, HTN, history of CHFpEF, ongoing tobacco use, chronic hypoxic respiratory failure on 2 L nasal cannula at home admitted on 08/12/2023 with acute on chronic hypoxic respiratory failure due to combination of COPD and diastolic CHF exacerbation in the setting of severe pulmonary hypertension   physician who gave her medication for fluid but this has not significantly improved her breathing.  She was brought to the ED where she was noted to have a high BNP of 871 and had mild hyponatremia.  Patient also had significant oxygen  requirement and was placed on 15 L high flow nasal cannula to maintain her sats above 85%.    -Assessment and Plan: Acute on Chronic diastolic (congestive) heart failure Resolved  Metabolic alkalosis  Admitted with worsening lower extremity edema and elevated BNP, patient notes she sometimes drinks more fluids than usual and she will sometimes eat more salt.   -echo from 08/13/2023 with EF of 60 to 65%, grade 1 diastolic dysfunction and severe pulmonary hypertension, no aortic stenosis  -2.8L Uop, 148<157<166 - Appears euvolemic, has elevation in serum creatinine, contraction alkalosis  -Transition to PO lasix  40mg  daily  - Follow daily weights, and strict I's and O's and low-sodium diet - Continue to check Reds clip measurements and continue prior to admission Jardiance  and Vasotec . Continue Spironolactone    Tobacco use disorder and acute COPD exacerbation Resovled  -cessation counseling provided -s/p 3d of azithromycin  - Continue nicotine  patch. -continue  IV steroids with daily dosing, bronchodilators, mucolytics    Acute on chronic respiratory failure with hypoxia [facility]) Patient states she wears ~4L [place] at home. Secondary to above.  -- Patient notes  non adherence with home O2 due continued tobacco use  - She smokes half a pack to 1 pack sometimes. -Cessation counseling provided - At time of admission require up to 15 L high flow nasal cannula; currently down to 4L   - Hold further IV diuresis, taper steroids     Hyponatremia Improving  -History of chronic hyponatremia with baseline usually around 130; Likely component of hypervolemia in the setting of CHF exacerbation. - denies significant alcohol intake - Stable.  - Continue to follow electrolytes trend.    Latest Ref Rng & Units 08/18/2023    8:04 AM 08/17/2023    6:58 AM 08/16/2023    5:01 AM  BMP  Glucose 70 - 99 mg/dL 63  93  811   BUN 8 - 23 mg/dL 39  36  30   Creatinine 0.44 - 1.00 mg/dL 9.14  7.82  9.56   Sodium 135 - 145 mmol/L 130  131  127   Potassium 3.5 - 5.1 mmol/L 3.5  4.0  4.4   Chloride 98 - 111 mmol/L 83  84  84   CO2 22 - 32 mmol/L 38  36  32   Calcium  8.9 - 10.3 mg/dL 8.3  8.4  8.5      severe pulmonary hypertension--in the setting of COPD -Please see echo report - Continue management as above  Microcytic anemia  H/o iron deficiency anemia -No overt bleeding appreciated -Last colonoscopy 2019 nl  ileum, internal hemorrhoids, 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 Hgb electrophoresis WNL. Pt receiving iron and follow.  - Hemoglobin stable -Iron/TIBC/Ferritin/ %Sat    Component Value Date/Time   IRON 15 (L) 08/17/2023 0658   TIBC 452 (H) 08/17/2023 0658   FERRITIN 11 08/17/2023 0658   IRONPCTSAT 3 (L) 08/17/2023 1610  -Continue IV Fe while inpatient   HLD (hyperlipidemia) -Continue atorvastatin   HTN (hypertension) -Controlled -Continue current antihypertensive agents - Low-sodium diet discussed with patient.  DM2--A1c 6.3 reflecting excellent diabetic  control PTA -Will continue Jardiance  -Continue to hold metformin  - Follow CBGs - Continue 5u long acting + Continue sliding scale insuli while on steroids  generalized weakness and deconditioning-----physical therapy recommends SNF rehab - Patient in agreement with short-term rehabilitation. - TOC has been made aware of medical readiness anticipated in the next 24-48 hours.  Metabolic alkalosis  Contraction alkalosis in the setting of diuresis  1x dose of acetazolamide while continue IV diuresis   Elevated BUN  Noted. In setting of steroid use. And IV diuresis    H/o pulmonary nodules  RML nodule. Follows with pulmonology   Status is: Inpatient   Disposition: The patient is from: Skilled nursing facility              Anticipated d/c is to: SNF              Anticipated d/c date is: 1-2 days; TOC working with [employer] for authorization              Patient currently is not medically stable to d/c.  Continued IV diuresis   Barriers: Not Clinically Stable-   Code Status :  -  Code Status: Full Code   Family Communication:   (patient is alert, awake and coherent)   DVT Prophylaxis  :   - SCDs   enoxaparin  (LOVENOX ) injection 40 mg Start: 08/12/23 2200   Lab Results  Component Value Date   PLT 300 08/17/2023    Inpatient Medications  Scheduled Meds:  arformoterol   15 mcg Nebulization BID   aspirin  EC  325 mg Oral q morning   atorvastatin   10 mg Oral Daily   budesonide  (PULMICORT ) nebulizer solution  0.5 mg Nebulization BID   dextromethorphan -guaiFENesin   1 tablet Oral BID   diclofenac Sodium  2 g Topical QID   empagliflozin   25 mg Oral Daily   enalapril   2.5 mg Oral Daily   enoxaparin  (LOVENOX ) injection  40 mg Subcutaneous Q24H   furosemide   60 mg Intravenous Q12H   insulin  aspart  0-9 Units Subcutaneous TID WC   insulin  glargine-yfgn  5 Units Subcutaneous QHS   methylPREDNISolone  (SOLU-MEDROL ) injection  40 mg Intravenous Daily   nicotine   21 mg  Transdermal Daily   pantoprazole   80 mg Oral Q1200   pneumococcal 20-valent conjugate vaccine  0.5 mL Intramuscular Tomorrow-1000   sodium chloride  flush  3 mL Intravenous Q12H   spironolactone   12.5 mg Oral Daily   sucralfate   1 g Oral TID WC & HS   Continuous Infusions:  iron sucrose 176.7 mL/hr at 08/17/23 1735   PRN Meds:.acetaminophen  **OR** acetaminophen , albuterol , metoprolol  tartrate, mouth rinse, polyethylene glycol, sodium chloride  flush   Anti-infectives (From admission, onward)    Start     Dose/Rate Route Frequency Ordered Stop   08/13/23 1930  azithromycin  (ZITHROMAX ) tablet 500 mg        500 mg Oral Daily 08/13/23 1842  08/15/23 1027         Subjective: Bartholomew Light HendersonSob improving. NO other complaints.     Objective: Vitals:   08/17/23 2042 08/17/23 2113 08/18/23 0354 08/18/23 0500  BP: (!) 121/56  136/65   Pulse: 73  69   Resp: 16  12   Temp: 98.7 F (37.1 C)  98.3 F (36.8 C)   TempSrc: Oral  Oral   SpO2: 96% 91% 99%   Weight:    67.4 kg  Height:        Intake/Output Summary (Last 24 hours) at 08/18/2023 0915 Last data filed at 08/18/2023 0900 Gross per 24 hour  Intake 589.64 ml  Output 2700 ml  Net -2110.36 ml   Filed Weights   08/16/23 0500 08/18/23 0500  Weight: 71.3 kg 67.4 kg       Physical Exam  Constitutional: In no distress.  Cardiovascular: Normal rate, regular rhythm.Trace bilateral lower extremity edema  Pulmonary: Non labored breathing on 4L [place], no wheezing or rales.   Abdominal: Soft. Normal bowel sounds. Non distended and non tender   Neurological: Alert and oriented to person, place, and time. Non focal  Skin: Skin is warm and dry.    Data Reviewed: I have personally reviewed following labs and imaging studies  CBC: Recent Labs  Lab 08/12/23 1341 08/17/23 0658  WBC 7.7 7.1  HGB 9.6* 9.0*  HCT 32.8* 29.9*  MCV 77.9* 75.3*  PLT 382 300   Basic Metabolic Panel: Recent Labs  Lab 08/12/23 1341 08/13/23 0501  08/15/23 0432 08/16/23 0501 08/17/23 0658 08/18/23 0804  NA 128* 126* 125* 127* 131* 130*  K 4.5 4.7 4.2 4.4 4.0 3.5  CL 93* 91* 81* 84* 84* 83*  CO2 25 27 29  32 36* 38*  GLUCOSE 139* 81 131* 123* 93 63*  BUN 22 24* 26* 30* 36* 39*  CREATININE 1.01* 0.90 0.96 0.94 1.01* 1.15*  CALCIUM  8.3* 8.3* 8.2* 8.5* 8.4* 8.3*  MG 1.6*  --   --  1.6* 1.6* 2.1   GFR: Estimated Creatinine Clearance: 44.4 mL/min (A) (by C-G formula based on SCr of 1.15 mg/dL (H)).  Recent Results (from the past 240 hours)  MRSA Next Gen by PCR, Nasal     Status: None   Collection Time: 08/12/23  5:55 PM   Specimen: Nasal Mucosa; Nasal Swab  Result Value Ref Range Status   MRSA by PCR Next Gen NOT DETECTED NOT DETECTED Final    Comment: (NOTE) The GeneXpert MRSA Assay (FDA approved for NASAL specimens only), is one component of a comprehensive MRSA colonization surveillance program. It is not intended to diagnose MRSA infection nor to guide or monitor treatment for MRSA infections. Test performance is not FDA approved in patients less than 47 years old. Performed at [facility], [address] [place], [place] [address]      Radiology Studies: No results found.  Scheduled Meds:  arformoterol   15 mcg Nebulization BID   aspirin  EC  325 mg Oral q morning   atorvastatin   10 mg Oral Daily   budesonide  (PULMICORT ) nebulizer solution  0.5 mg Nebulization BID   dextromethorphan -guaiFENesin   1 tablet Oral BID   diclofenac Sodium  2 g Topical QID   empagliflozin   25 mg Oral Daily   enalapril   2.5 mg Oral Daily   enoxaparin  (LOVENOX ) injection  40 mg Subcutaneous Q24H   furosemide   60 mg Intravenous Q12H   insulin  aspart  0-9 Units Subcutaneous TID WC   insulin  glargine-yfgn  5 Units Subcutaneous QHS   methylPREDNISolone  (SOLU-MEDROL ) injection  40 mg Intravenous Daily   nicotine   21 mg Transdermal Daily   pantoprazole   80 mg Oral Q1200   pneumococcal 20-valent conjugate vaccine  0.5 mL Intramuscular  Tomorrow-1000   sodium chloride  flush  3 mL Intravenous Q12H   spironolactone   12.5 mg Oral Daily   sucralfate   1 g Oral TID WC & HS   Continuous Infusions:  iron sucrose 176.7 mL/hr at 08/17/23 1735     LOS: 6 days    Joette Mustard M.D on 08/18/2023 at 9:15 AM  Go to [email or web address] - for contact info  [facility] - Office  [phone]  If 7PM-7AM, please contact night-coverage [email or web address] 08/18/2023, 9:15 AM

## 2023-08-18 NOTE — Progress Notes (Signed)
 Tele called and said patient had 3 bts of V. Tach then later 4 bts and 6 bts of V. Tach back to back. When assessed, patient denied any symptoms. MD notified but no new otder. We continue to monitor.

## 2023-08-18 NOTE — Plan of Care (Signed)
  Problem: Pain Managment: Goal: General experience of comfort will improve and/or be controlled Outcome: Progressing   Problem: Safety: Goal: Ability to remain free from injury will improve Outcome: Progressing   Problem: Skin Integrity: Goal: Risk for impaired skin integrity will decrease Outcome: Progressing

## 2023-08-18 NOTE — Plan of Care (Signed)
  Problem: Education: Goal: Knowledge of General Education information will improve Description: Including pain rating scale, medication(s)/side effects and non-pharmacologic comfort measures Outcome: Progressing   Problem: Nutrition: Goal: Adequate nutrition will be maintained Outcome: Progressing   Problem: Pain Managment: Goal: General experience of comfort will improve and/or be controlled Outcome: Progressing

## 2023-08-19 ENCOUNTER — Encounter (HOSPITAL_COMMUNITY): Payer: Self-pay

## 2023-08-19 DIAGNOSIS — K219 Gastro-esophageal reflux disease without esophagitis: Secondary | ICD-10-CM | POA: Diagnosis not present

## 2023-08-19 DIAGNOSIS — J9621 Acute and chronic respiratory failure with hypoxia: Secondary | ICD-10-CM | POA: Diagnosis not present

## 2023-08-19 DIAGNOSIS — I272 Pulmonary hypertension, unspecified: Secondary | ICD-10-CM | POA: Diagnosis not present

## 2023-08-19 DIAGNOSIS — E1122 Type 2 diabetes mellitus with diabetic chronic kidney disease: Secondary | ICD-10-CM | POA: Diagnosis not present

## 2023-08-19 DIAGNOSIS — M6281 Muscle weakness (generalized): Secondary | ICD-10-CM | POA: Diagnosis not present

## 2023-08-19 DIAGNOSIS — J441 Chronic obstructive pulmonary disease with (acute) exacerbation: Secondary | ICD-10-CM | POA: Diagnosis not present

## 2023-08-19 DIAGNOSIS — E785 Hyperlipidemia, unspecified: Secondary | ICD-10-CM | POA: Diagnosis not present

## 2023-08-19 DIAGNOSIS — I5032 Chronic diastolic (congestive) heart failure: Secondary | ICD-10-CM | POA: Diagnosis not present

## 2023-08-19 DIAGNOSIS — R531 Weakness: Secondary | ICD-10-CM | POA: Diagnosis not present

## 2023-08-19 DIAGNOSIS — I5033 Acute on chronic diastolic (congestive) heart failure: Secondary | ICD-10-CM | POA: Diagnosis not present

## 2023-08-19 DIAGNOSIS — R278 Other lack of coordination: Secondary | ICD-10-CM | POA: Diagnosis not present

## 2023-08-19 DIAGNOSIS — Z7401 Bed confinement status: Secondary | ICD-10-CM | POA: Diagnosis not present

## 2023-08-19 LAB — GLUCOSE, CAPILLARY
Glucose-Capillary: 122 mg/dL — ABNORMAL HIGH (ref 70–99)
Glucose-Capillary: 109 mg/dL — ABNORMAL HIGH (ref 70–99)

## 2023-08-19 LAB — BASIC METABOLIC PANEL WITH GFR
Anion gap: 11 (ref 5–15)
BUN: 38 mg/dL — ABNORMAL HIGH (ref 8–23)
CO2: 35 mmol/L — ABNORMAL HIGH (ref 22–32)
Calcium: 8.5 mg/dL — ABNORMAL LOW (ref 8.9–10.3)
Chloride: 84 mmol/L — ABNORMAL LOW (ref 98–111)
Creatinine, Ser: 0.98 mg/dL (ref 0.44–1.00)
GFR, Estimated: >60 mL/min (ref >60)
Glucose, Bld: 61 mg/dL — ABNORMAL LOW (ref 70–99)
Potassium: 3.3 mmol/L — ABNORMAL LOW (ref 3.5–5.1)
Sodium: 130 mmol/L — ABNORMAL LOW (ref 135–145)

## 2023-08-19 LAB — MAGNESIUM: Magnesium: 1.9 mg/dL (ref 1.7–2.4)

## 2023-08-19 MED ORDER — FUROSEMIDE 40 MG PO TABS: 40.0000 mg | ORAL_TABLET | Freq: Every day | ORAL | 1 refills | Status: AC

## 2023-08-19 MED ORDER — POTASSIUM CHLORIDE CRYS ER 20 MEQ PO TBCR
40.0000 meq | EXTENDED_RELEASE_TABLET | Freq: Once | ORAL | Status: AC
  Administered 2023-08-19: 40 meq via ORAL
  Filled 2023-08-19: qty 2

## 2023-08-19 MED ORDER — ENALAPRIL MALEATE 2.5 MG PO TABS: 2.5000 mg | ORAL_TABLET | Freq: Every day | ORAL | 0 refills | Status: DC

## 2023-08-19 MED ORDER — SUCRALFATE 1 G PO TABS
1.0000 g | ORAL_TABLET | Freq: Three times a day (TID) | ORAL | 0 refills | Status: AC
Start: 2023-08-19 — End: ?

## 2023-08-19 MED ORDER — PREDNISONE 10 MG PO TABS: 40.0000 mg | ORAL_TABLET | Freq: Every day | ORAL | 0 refills | Status: AC

## 2023-08-19 MED ORDER — SPIRONOLACTONE 25 MG PO TABS: 12.5000 mg | ORAL_TABLET | Freq: Every day | ORAL | 0 refills | Status: DC

## 2023-08-19 MED ORDER — ASPIRIN EC 325 MG PO TBEC: 325.0000 mg | DELAYED_RELEASE_TABLET | Freq: Every morning | ORAL | 0 refills | Status: DC

## 2023-08-19 MED ORDER — DM-GUAIFENESIN ER 30-600 MG PO TB12: 1.0000 | ORAL_TABLET | Freq: Two times a day (BID) | ORAL | 0 refills | Status: AC

## 2023-08-19 NOTE — Discharge Summary (Signed)
 Physician Discharge Summary  NESA DISTEL ZOX:[id number] DOB: Nov 12, 1955 DOA: 08/12/2023  PCP: [name], MD  Admit date: 08/12/2023  Discharge date: 08/19/2023  Admitted From:SNF  Disposition:  SNF  Recommendations for Outpatient Follow-up:  Follow up with PCP in 1-2 weeks Follow-up with cardiology as scheduled on 6/23 Remain on Lasix  40 mg daily as prescribed Remain on prednisone  for 3 more days to complete taper and use home breathing treatments as needed for shortness of breath or wheezing. Continue on medications as noted below  Home Health: None  Equipment/Devices: Has chronic 4 L nasal cannula oxygen   Discharge Condition:Stable  CODE STATUS: Full  Diet recommendation: Heart Healthy  Brief/Interim Summary: 68 year old with history of COPD, T2DM, HTN, history of CHFpEF, ongoing tobacco use, chronic hypoxic respiratory failure on 4 L nasal cannula at home admitted on 08/12/2023 with acute on chronic hypoxic respiratory failure due to combination of COPD and diastolic CHF exacerbation in the setting of severe pulmonary hypertension.  Patient was admitted for acute on chronic hypoxemic respiratory failure secondary to acute on chronic diastolic CHF exacerbation as well as acute COPD exacerbation.  Both of these appear to have resolved and she is currently euvolemic and without any significant further shortness of breath or wheezing noted.  She is able to transition to oral prednisone  for 3 more days and continue other home medications as prescribed below.  She will remain on oral Lasix  as prescribed as well and will need close follow-up with cardiology as scheduled on 6/23.  No other acute events or concerns noted during this admission.  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  Chronic hyponatremia.  Discharge Instructions  Discharge Instructions     Diet - low sodium heart healthy   Complete by: As directed    Increase activity slowly   Complete by: As directed    No wound care   Complete by: As directed       Allergies as of 08/19/2023   No Known Allergies      Medication List     STOP taking these medications    amLODipine  5 MG tablet Commonly known as: NORVASC        TAKE these medications    acetaminophen  500 MG tablet Commonly known as: TYLENOL  Take 500-1,000 mg by mouth every 6 (six) hours as needed for mild pain (pain score 1-3).   albuterol  108 (90 Base) MCG/ACT inhaler Commonly known as: VENTOLIN  HFA Inhale 1-2 puffs into the lungs every 4 (four) hours as needed for wheezing or shortness of breath.   Anoro Ellipta  62.5-25 MCG/ACT Aepb Generic drug: umeclidinium-vilanterol Inhale 1 puff into the lungs daily.   aspirin  EC 325 MG tablet Take 1 tablet (325 mg total) by mouth every morning.   atorvastatin  10 MG tablet Commonly known as: LIPITOR Take 10 mg by mouth daily.   dextromethorphan -guaiFENesin  30-600 MG 12hr tablet Commonly known as: MUCINEX  DM Take 1 tablet by mouth 2 (two) times daily for 7 days.  enalapril  2.5 MG tablet Commonly known as: VASOTEC  Take 1 tablet (2.5 mg total) by mouth daily.   esomeprazole  40 MG capsule Commonly known as: NEXIUM  Take 40 mg by mouth daily.   furosemide  40 MG tablet Commonly known as: LASIX  Take 1 tablet (40 mg total) by mouth daily.   Jardiance  25 MG Tabs tablet Generic drug: empagliflozin  Take 25 mg by mouth daily.   metFORMIN  500  MG tablet Commonly known as: GLUCOPHAGE  Take 1.5 tablets (750 mg total) by mouth 2 (two) times daily with a meal. What changed: how much to take   ondansetron  4 MG disintegrating tablet Commonly known as: ZOFRAN -ODT Take 1 tablet (4 mg total) by mouth every 8 (eight) hours as needed for nausea or vomiting.   predniSONE  10 MG tablet Commonly known as: DELTASONE  Take 4 tablets (40 mg total) by mouth daily for 3 days.   spironolactone  25 MG tablet Commonly known as: ALDACTONE  Take 0.5 tablets (12.5 mg total) by mouth daily.   sucralfate  1 g tablet Commonly known as: Carafate  Take 1 tablet (1 g total) by mouth 4 (four) times daily -  with meals and at bedtime.        Contact information for follow-up providers     [facility]. Go on 09/02/2023.   Specialty: Cardiology Why: Hospital Follow -Up 09/02/23 @ 10:15 Please bring all medications with you to appointment. [facility] in the hospital at [facility] Look for Entrance C off of [address] [place] Contact information: [address] [place]  [address] [phone]        [name], MD. Schedule an appointment as soon as possible for a visit in 1 week(s).   Specialty: Family Medicine Contact information: [address] [place], [address] [place] [address] [phone]              Contact information for after-discharge care     Destination     [facility] .   Service: Skilled Nursing Contact information: [address] [place]  [address] [phone]                    No Known Allergies  Consultations: None   Procedures/Studies: ECHOCARDIOGRAM COMPLETE Result Date: 08/13/2023    ECHOCARDIOGRAM REPORT   Patient Name:   Debbie Mullins Date of Exam: 08/13/2023 Medical Rec #:  [id number]          Height:       63.0 in Accession  #:    [id number]         Weight:       180.3 lb Date of Birth:  June 27, 1955          BSA:          1.850 m Patient Age:    68 years           BP:           132/50 mmHg Patient Gender: F                  HR:           66 bpm. Exam Location:  [facility] Procedure: 2D Echo, Cardiac Doppler and Color Doppler (Both Spectral and Color            Flow Doppler were utilized during procedure). Indications:    CHF  History:  Patient has prior history of Echocardiogram examinations and                 Patient has no prior history of Echocardiogram examinations,                 most recent 07/25/2019. CHF; COPD.  Sonographer:    Jeralene Mom Referring Phys: [id number] Broach S PRATT IMPRESSIONS  1. Left ventricular ejection fraction, by estimation, is 60 to 65%. The left ventricle has normal function. The left ventricle has no regional wall motion abnormalities. Left ventricular diastolic parameters are consistent with Grade I diastolic dysfunction (impaired relaxation). There is the interventricular septum is flattened in systole and diastole, consistent with right ventricular pressure and volume overload.  2. Right ventricular systolic function is severely reduced. The right ventricular size is severely enlarged. There is severely elevated pulmonary artery systolic pressure.  3. The mitral valve is normal in structure. No evidence of mitral valve regurgitation. No evidence of mitral stenosis.  4. The tricuspid valve is abnormal. Tricuspid valve regurgitation is moderate.  5. The aortic valve is tricuspid. There is mild calcification of the aortic valve. There is mild thickening of the aortic valve. Aortic valve regurgitation is not visualized. No aortic stenosis is present.  6. The inferior vena cava is dilated in size with <50% respiratory variability, suggesting right atrial pressure of 15 mmHg. FINDINGS  Left Ventricle: Left ventricular ejection fraction, by estimation, is 60 to 65%. The left ventricle has normal  function. The left ventricle has no regional wall motion abnormalities. The left ventricular internal cavity size was normal in size. There is  no left ventricular hypertrophy. The interventricular septum is flattened in systole and diastole, consistent with right ventricular pressure and volume overload. Left ventricular diastolic parameters are consistent with Grade I diastolic dysfunction (impaired relaxation). Normal left ventricular filling pressure. Right Ventricle: The right ventricular size is severely enlarged. Right vetricular wall thickness was not well visualized. Right ventricular systolic function is severely reduced. There is severely elevated pulmonary artery systolic pressure. The tricuspid regurgitant velocity is 4.69 m/s, and with an assumed right atrial pressure of 15 mmHg, the estimated right ventricular systolic pressure is 103.0 mmHg. Left Atrium: Left atrial size was normal in size. Right Atrium: Right atrial size was normal in size. Pericardium: Trivial pericardial effusion is present. The pericardial effusion is circumferential. Mitral Valve: The mitral valve is normal in structure. No evidence of mitral valve regurgitation. No evidence of mitral valve stenosis. Tricuspid Valve: The tricuspid valve is abnormal. Tricuspid valve regurgitation is moderate . No evidence of tricuspid stenosis. Aortic Valve: The aortic valve is tricuspid. There is mild calcification of the aortic valve. There is mild thickening of the aortic valve. There is mild aortic valve annular calcification. Aortic valve regurgitation is not visualized. No aortic stenosis  is present. Aortic valve mean gradient measures 7.0 mmHg. Aortic valve peak gradient measures 13.0 mmHg. Aortic valve area, by VTI measures 2.22 cm. Pulmonic Valve: The pulmonic valve was not well visualized. Pulmonic valve regurgitation is mild. No evidence of pulmonic stenosis. Aorta: The aortic root and ascending aorta are structurally normal, with no  evidence of dilitation. Venous: The inferior vena cava is dilated in size with less than 50% respiratory variability, suggesting right atrial pressure of 15 mmHg. IAS/Shunts: There is left bowing of the interatrial septum, suggestive of elevated right atrial pressure. No atrial level shunt detected by color flow Doppler.  LEFT VENTRICLE PLAX 2D LVIDd:  3.53 cm   Diastology LVIDs:         2.40 cm   LV e' medial:    7.62 cm/s LV PW:         1.00 cm   LV E/e' medial:  13.6 LV IVS:        1.00 cm   LV e' lateral:   9.14 cm/s LVOT diam:     2.00 cm   LV E/e' lateral: 11.4 LV SV:         76 LV SV Index:   41 LVOT Area:     3.14 cm  RIGHT VENTRICLE RV Basal diam:  4.00 cm RV Mid diam:    3.50 cm RV S prime:     7.72 cm/s TAPSE (M-mode): 0.9 cm LEFT ATRIUM           Index        RIGHT ATRIUM           Index LA diam:      4.20 cm 2.27 cm/m   RA Area:     16.20 cm LA Vol (A4C): 33.3 ml 18.00 ml/m  RA Volume:   42.70 ml  23.08 ml/m  AORTIC VALVE                     PULMONIC VALVE AV Area (Vmax):    2.16 cm      PR End Diast Vel: 10.76 msec AV Area (Vmean):   2.08 cm AV Area (VTI):     2.22 cm AV Vmax:           180.00 cm/s AV Vmean:          119.000 cm/s AV VTI:            0.344 m AV Peak Grad:      13.0 mmHg AV Mean Grad:      7.0 mmHg LVOT Vmax:         124.00 cm/s LVOT Vmean:        78.600 cm/s LVOT VTI:          0.243 m LVOT/AV VTI ratio: 0.71  AORTA Ao Root diam: 2.40 cm Ao Asc diam:  2.20 cm MITRAL VALVE                TRICUSPID VALVE MV Area (PHT): 3.34 cm     TR Peak grad:   88.0 mmHg MV Decel Time: 227 msec     TR Vmax:        469.00 cm/s MV E velocity: 104.00 cm/s MV A velocity: 125.00 cm/s  SHUNTS MV E/A ratio:  0.83         Systemic VTI:  0.24 m                             Systemic Diam: 2.00 cm Armida Lander MD Electronically signed by Armida Lander MD Signature Date/Time: 08/13/2023/4:52:16 PM    Final    DG Chest Portable 1 View Result Date: 08/12/2023 CLINICAL DATA:  CHF EXAM: PORTABLE CHEST  1 VIEW COMPARISON:  Chest x-ray 07/17/2023 FINDINGS: Lung volumes are low likely accentuating central pulmonary vascularity. There are minimal patchy opacities in the lung bases which have increased from prior. The costophrenic angles are clear. The cardiac silhouette is within normal limits this can be seen. No acute fractures are seen. Cervical spinal fusion plate is present. IMPRESSION: 1. Low lung volumes likely accentuating central pulmonary  vascularity. 2. Minimal patchy opacities in the lung bases which have increased from prior, possibly atelectasis. Electronically Signed   By: Tyron Gallon M.D.   On: 08/12/2023 15:14     Discharge Exam: Vitals:   08/19/23 0344 08/19/23 0943  BP: (!) 104/54 107/63  Pulse: 76 67  Resp: 14   Temp: 98.2 F (36.8 C) 98.3 F (36.8 C)  SpO2: 90% 100%   Vitals:   08/18/23 2148 08/19/23 0344 08/19/23 0441 08/19/23 0943  BP:  (!) 104/54  107/63  Pulse:  76  67  Resp:  14    Temp:  98.2 F (36.8 C)  98.3 F (36.8 C)  TempSrc:  Oral  Oral  SpO2: 97% 90%  100%  Weight:   66.2 kg   Height:        General: Pt is alert, awake, not in acute distress Cardiovascular: RRR, S1/S2 +, no rubs, no gallops Respiratory: CTA bilaterally, no wheezing, no rhonchi, 4 L nasal cannula Abdominal: Soft, NT, ND, bowel sounds + Extremities: no edema, no cyanosis    The results of significant diagnostics from this hospitalization (including imaging, microbiology, ancillary and laboratory) are listed below for reference.     Microbiology: Recent Results (from the past 240 hours)  MRSA Next Gen by PCR, Nasal     Status: None   Collection Time: 08/12/23  5:55 PM   Specimen: Nasal Mucosa; Nasal Swab  Result Value Ref Range Status   MRSA by PCR Next Gen NOT DETECTED NOT DETECTED Final    Comment: (NOTE) The GeneXpert MRSA Assay (FDA approved for NASAL specimens only), is one component of a comprehensive MRSA colonization surveillance program. It is not intended to  diagnose MRSA infection nor to guide or monitor treatment for MRSA infections. Test performance is not FDA approved in patients less than 17 years old. Performed at [facility], [address] [place], [place] [address]      Labs: BNP (last 3 results) Recent Labs    05/17/23 0110 08/12/23 1341  BNP 223.4* 871.0*   Basic Metabolic Panel: Recent Labs  Lab 08/12/23 1341 08/13/23 0501 08/15/23 0432 08/16/23 0501 08/17/23 0658 08/18/23 0804 08/19/23 0552  NA 128*   < > 125* 127* 131* 130* 130*  K 4.5   < > 4.2 4.4 4.0 3.5 3.3*  CL 93*   < > 81* 84* 84* 83* 84*  CO2 25   < > 29 32 36* 38* 35*  GLUCOSE 139*   < > 131* 123* 93 63* 61*  BUN 22   < > 26* 30* 36* 39* 38*  CREATININE 1.01*   < > 0.96 0.94 1.01* 1.15* 0.98  CALCIUM  8.3*   < > 8.2* 8.5* 8.4* 8.3* 8.5*  MG 1.6*  --   --  1.6* 1.6* 2.1 1.9   < > = values in this interval not displayed.   Liver Function Tests: No results for input(s): "AST", "ALT", "ALKPHOS", "BILITOT", "PROT", "ALBUMIN" in the last 168 hours. No results for input(s): "LIPASE", "AMYLASE" in the last 168 hours. No results for input(s): "AMMONIA" in the last 168 hours. CBC: Recent Labs  Lab 08/12/23 1341 08/17/23 0658  WBC 7.7 7.1  HGB 9.6* 9.0*  HCT 32.8* 29.9*  MCV 77.9* 75.3*  PLT 382 300   Cardiac Enzymes: No results for input(s): "CKTOTAL", "CKMB", "CKMBINDEX", "TROPONINI" in the last 168 hours. BNP: Invalid input(s): "POCBNP" CBG: Recent Labs  Lab 08/18/23 0722 08/18/23 1112 08/18/23 1617 08/18/23 2115 08/19/23  0745  GLUCAP 75 172* 192* 170* 122*   D-Dimer No results for input(s): "DDIMER" in the last 72 hours. Hgb A1c No results for input(s): "HGBA1C" in the last 72 hours. Lipid Profile No results for input(s): "CHOL", "HDL", "LDLCALC", "TRIG", "CHOLHDL", "LDLDIRECT" in the last 72 hours. Thyroid  function studies No results for input(s): "TSH", "T4TOTAL", "T3FREE", "THYROIDAB" in the last 72 hours.  Invalid input(s):  "FREET3" Anemia work up Recent Labs    08/17/23 0658  FERRITIN 11  TIBC 452*  IRON 15*   Urinalysis    Component Value Date/Time   COLORURINE STRAW (A) 12/15/2018 2200   APPEARANCEUR CLEAR 12/15/2018 2200   LABSPEC 1.009 12/15/2018 2200   PHURINE 5.0 12/15/2018 2200   GLUCOSEU NEGATIVE 12/15/2018 2200   HGBUR SMALL (A) 12/15/2018 2200   BILIRUBINUR negative 10/24/2019 1548   KETONESUR negative 10/24/2019 1548   KETONESUR NEGATIVE 12/15/2018 2200   PROTEINUR negative 10/24/2019 1548   PROTEINUR NEGATIVE 12/15/2018 2200   UROBILINOGEN 0.2 10/24/2019 1548   UROBILINOGEN 1.0 08/05/2013 1018   NITRITE Negative 10/24/2019 1548   NITRITE NEGATIVE 12/15/2018 2200   LEUKOCYTESUR Negative 10/24/2019 1548   LEUKOCYTESUR NEGATIVE 12/15/2018 2200   Sepsis Labs Recent Labs  Lab 08/12/23 1341 08/17/23 0658  WBC 7.7 7.1   Microbiology Recent Results (from the past 240 hours)  MRSA Next Gen by PCR, Nasal     Status: None   Collection Time: 08/12/23  5:55 PM   Specimen: Nasal Mucosa; Nasal Swab  Result Value Ref Range Status   MRSA by PCR Next Gen NOT DETECTED NOT DETECTED Final    Comment: (NOTE) The GeneXpert MRSA Assay (FDA approved for NASAL specimens only), is one component of a comprehensive MRSA colonization surveillance program. It is not intended to diagnose MRSA infection nor to guide or monitor treatment for MRSA infections. Test performance is not FDA approved in patients less than 33 years old. Performed at [facility], [address], [place], [place] [address]      Time coordinating discharge: 35 minutes  SIGNED:   Cornelius Dill, DO [facility] 08/19/2023, 9:45 AM  If 7PM-7AM, please contact night-coverage [email or web address]

## 2023-08-19 NOTE — Care Management Important Message (Signed)
 Important Message  Patient Details  Name: Debbie Mullins MRN: [id number] Date of Birth: 11/26/1955   Important Message Given:  Yes - Medicare IM     Kyia Rhude L Nalia Honeycutt 08/19/2023, 11:07 AM

## 2023-08-19 NOTE — TOC Transition Note (Signed)
 Transition of Care [facility]) - Discharge Note   Patient Details  Name: Debbie Mullins MRN: [id number] Date of Birth: [birth year]  [facility] CM/SW Contact:  Katrinka Parr, LCSW Phone Number: 08/19/2023, 10:53 AM   Clinical Narrative:      Per MD patient ready for DC to [facility]. RN, patient, patient's family, and facility notified of DC. Discharge Summary and FL2 sent to facility. RN to call report prior to discharge [phone]). DC packet on chart. Ambulance transport requested for patient.   CSW will sign off for now as social work intervention is no longer needed. Please consult us  again if new needs arise.   Final next level of care: Skilled Nursing Facility Barriers to Discharge: No Barriers Identified   Patient Goals and CMS Choice Patient states their goals for this hospitalization and ongoing recovery are:: agreeable to SNF CMS Medicare.gov Compare Post Acute Care list provided to:: Patient Choice offered to / list presented to : Patient [place] ownership interest in [facility].provided to:: Patient    Discharge Placement              Patient chooses bed at:  ([facility]) Patient to be transferred to facility by: [facility]   Patient and family notified of of transfer: 08/19/23  Discharge Plan and Services Additional resources added to the After Visit Summary for                                       Social Drivers of Health (SDOH) Interventions SDOH Screenings   Food Insecurity: No Food Insecurity (08/15/2023)  Housing: Low Risk  (08/15/2023)  Transportation Needs: No Transportation Needs (08/15/2023)  Utilities: Not At Risk (08/12/2023)  Social Connections: Unknown (08/12/2023)  Tobacco Use: High Risk (08/15/2023)     Readmission Risk Interventions    08/14/2023    9:50 AM  Readmission Risk Prevention Plan  Transportation Screening Complete  PCP or Specialist Appt within 5-7 Days Not Complete  Home Care Screening  Complete  Medication Review (RN CM) Complete

## 2023-08-19 NOTE — Plan of Care (Signed)
  Problem: Pain Managment: Goal: General experience of comfort will improve and/or be controlled Outcome: Progressing   Problem: Safety: Goal: Ability to remain free from injury will improve Outcome: Progressing   Problem: Skin Integrity: Goal: Risk for impaired skin integrity will decrease Outcome: Progressing

## 2023-08-19 NOTE — Progress Notes (Signed)
 Patient discharging to [facility]. Report provided to [facility].

## 2023-08-26 DIAGNOSIS — I5033 Acute on chronic diastolic (congestive) heart failure: Secondary | ICD-10-CM | POA: Diagnosis not present

## 2023-08-26 DIAGNOSIS — E1122 Type 2 diabetes mellitus with diabetic chronic kidney disease: Secondary | ICD-10-CM | POA: Diagnosis not present

## 2023-08-26 DIAGNOSIS — J9621 Acute and chronic respiratory failure with hypoxia: Secondary | ICD-10-CM | POA: Diagnosis not present

## 2023-08-27 DIAGNOSIS — J9621 Acute and chronic respiratory failure with hypoxia: Secondary | ICD-10-CM | POA: Diagnosis not present

## 2023-08-27 DIAGNOSIS — E1122 Type 2 diabetes mellitus with diabetic chronic kidney disease: Secondary | ICD-10-CM | POA: Diagnosis not present

## 2023-08-27 DIAGNOSIS — I5033 Acute on chronic diastolic (congestive) heart failure: Secondary | ICD-10-CM | POA: Diagnosis not present

## 2023-08-29 DIAGNOSIS — E1122 Type 2 diabetes mellitus with diabetic chronic kidney disease: Secondary | ICD-10-CM | POA: Diagnosis not present

## 2023-08-29 DIAGNOSIS — J9621 Acute and chronic respiratory failure with hypoxia: Secondary | ICD-10-CM | POA: Diagnosis not present

## 2023-08-29 DIAGNOSIS — I5033 Acute on chronic diastolic (congestive) heart failure: Secondary | ICD-10-CM | POA: Diagnosis not present

## 2023-08-30 DIAGNOSIS — J9621 Acute and chronic respiratory failure with hypoxia: Secondary | ICD-10-CM | POA: Diagnosis not present

## 2023-08-30 DIAGNOSIS — I5033 Acute on chronic diastolic (congestive) heart failure: Secondary | ICD-10-CM | POA: Diagnosis not present

## 2023-08-30 DIAGNOSIS — E1122 Type 2 diabetes mellitus with diabetic chronic kidney disease: Secondary | ICD-10-CM | POA: Diagnosis not present

## 2023-09-02 ENCOUNTER — Encounter (HOSPITAL_COMMUNITY)

## 2023-09-03 DIAGNOSIS — Z7982 Long term (current) use of aspirin: Secondary | ICD-10-CM | POA: Diagnosis not present

## 2023-09-03 DIAGNOSIS — F17210 Nicotine dependence, cigarettes, uncomplicated: Secondary | ICD-10-CM | POA: Diagnosis not present

## 2023-09-03 DIAGNOSIS — I110 Hypertensive heart disease with heart failure: Secondary | ICD-10-CM | POA: Diagnosis not present

## 2023-09-03 DIAGNOSIS — R918 Other nonspecific abnormal finding of lung field: Secondary | ICD-10-CM | POA: Diagnosis not present

## 2023-09-03 DIAGNOSIS — E871 Hypo-osmolality and hyponatremia: Secondary | ICD-10-CM | POA: Diagnosis not present

## 2023-09-03 DIAGNOSIS — I2723 Pulmonary hypertension due to lung diseases and hypoxia: Secondary | ICD-10-CM | POA: Diagnosis not present

## 2023-09-03 DIAGNOSIS — Z9981 Dependence on supplemental oxygen: Secondary | ICD-10-CM | POA: Diagnosis not present

## 2023-09-03 DIAGNOSIS — Z981 Arthrodesis status: Secondary | ICD-10-CM | POA: Diagnosis not present

## 2023-09-03 DIAGNOSIS — E1122 Type 2 diabetes mellitus with diabetic chronic kidney disease: Secondary | ICD-10-CM | POA: Diagnosis not present

## 2023-09-03 DIAGNOSIS — Z9089 Acquired absence of other organs: Secondary | ICD-10-CM | POA: Diagnosis not present

## 2023-09-03 DIAGNOSIS — M4712 Other spondylosis with myelopathy, cervical region: Secondary | ICD-10-CM | POA: Diagnosis not present

## 2023-09-03 DIAGNOSIS — Z860100 Personal history of colon polyps, unspecified: Secondary | ICD-10-CM | POA: Diagnosis not present

## 2023-09-03 DIAGNOSIS — E785 Hyperlipidemia, unspecified: Secondary | ICD-10-CM | POA: Diagnosis not present

## 2023-09-03 DIAGNOSIS — E873 Alkalosis: Secondary | ICD-10-CM | POA: Diagnosis not present

## 2023-09-03 DIAGNOSIS — I5032 Chronic diastolic (congestive) heart failure: Secondary | ICD-10-CM | POA: Diagnosis not present

## 2023-09-03 DIAGNOSIS — K219 Gastro-esophageal reflux disease without esophagitis: Secondary | ICD-10-CM | POA: Diagnosis not present

## 2023-09-03 DIAGNOSIS — J9621 Acute and chronic respiratory failure with hypoxia: Secondary | ICD-10-CM | POA: Diagnosis not present

## 2023-09-03 DIAGNOSIS — Z96653 Presence of artificial knee joint, bilateral: Secondary | ICD-10-CM | POA: Diagnosis not present

## 2023-09-03 DIAGNOSIS — N181 Chronic kidney disease, stage 1: Secondary | ICD-10-CM | POA: Diagnosis not present

## 2023-09-03 DIAGNOSIS — J4489 Other specified chronic obstructive pulmonary disease: Secondary | ICD-10-CM | POA: Diagnosis not present

## 2023-09-03 DIAGNOSIS — D509 Iron deficiency anemia, unspecified: Secondary | ICD-10-CM | POA: Diagnosis not present

## 2023-09-04 ENCOUNTER — Telehealth (HOSPITAL_COMMUNITY): Payer: Self-pay

## 2023-09-04 NOTE — Telephone Encounter (Signed)
 Called to confirm/remind patient of their appointment at the [facility] on 09/05/2023 9:30.   Appointment:   [] Confirmed  [x] Left mess   [] No answer/No voice mail  [] VM Full/unable to leave message  [] Phone not in service  Patient reminded to bring all medications and/or complete list.  Confirmed patient has transportation. Gave directions, instructed to utilize valet parking.

## 2023-09-05 ENCOUNTER — Encounter (HOSPITAL_COMMUNITY)

## 2023-09-05 DIAGNOSIS — E873 Alkalosis: Secondary | ICD-10-CM | POA: Diagnosis not present

## 2023-09-05 DIAGNOSIS — Z981 Arthrodesis status: Secondary | ICD-10-CM | POA: Diagnosis not present

## 2023-09-05 DIAGNOSIS — F17210 Nicotine dependence, cigarettes, uncomplicated: Secondary | ICD-10-CM | POA: Diagnosis not present

## 2023-09-05 DIAGNOSIS — Z9089 Acquired absence of other organs: Secondary | ICD-10-CM | POA: Diagnosis not present

## 2023-09-05 DIAGNOSIS — E785 Hyperlipidemia, unspecified: Secondary | ICD-10-CM | POA: Diagnosis not present

## 2023-09-05 DIAGNOSIS — K219 Gastro-esophageal reflux disease without esophagitis: Secondary | ICD-10-CM | POA: Diagnosis not present

## 2023-09-05 DIAGNOSIS — N181 Chronic kidney disease, stage 1: Secondary | ICD-10-CM | POA: Diagnosis not present

## 2023-09-05 DIAGNOSIS — R918 Other nonspecific abnormal finding of lung field: Secondary | ICD-10-CM | POA: Diagnosis not present

## 2023-09-05 DIAGNOSIS — E1122 Type 2 diabetes mellitus with diabetic chronic kidney disease: Secondary | ICD-10-CM | POA: Diagnosis not present

## 2023-09-05 DIAGNOSIS — Z860100 Personal history of colon polyps, unspecified: Secondary | ICD-10-CM | POA: Diagnosis not present

## 2023-09-05 DIAGNOSIS — E871 Hypo-osmolality and hyponatremia: Secondary | ICD-10-CM | POA: Diagnosis not present

## 2023-09-05 DIAGNOSIS — M4712 Other spondylosis with myelopathy, cervical region: Secondary | ICD-10-CM | POA: Diagnosis not present

## 2023-09-05 DIAGNOSIS — Z9981 Dependence on supplemental oxygen: Secondary | ICD-10-CM | POA: Diagnosis not present

## 2023-09-05 DIAGNOSIS — D509 Iron deficiency anemia, unspecified: Secondary | ICD-10-CM | POA: Diagnosis not present

## 2023-09-05 DIAGNOSIS — J4489 Other specified chronic obstructive pulmonary disease: Secondary | ICD-10-CM | POA: Diagnosis not present

## 2023-09-05 DIAGNOSIS — Z7982 Long term (current) use of aspirin: Secondary | ICD-10-CM | POA: Diagnosis not present

## 2023-09-05 DIAGNOSIS — I2723 Pulmonary hypertension due to lung diseases and hypoxia: Secondary | ICD-10-CM | POA: Diagnosis not present

## 2023-09-05 DIAGNOSIS — Z96653 Presence of artificial knee joint, bilateral: Secondary | ICD-10-CM | POA: Diagnosis not present

## 2023-09-05 DIAGNOSIS — J9621 Acute and chronic respiratory failure with hypoxia: Secondary | ICD-10-CM | POA: Diagnosis not present

## 2023-09-05 DIAGNOSIS — I5032 Chronic diastolic (congestive) heart failure: Secondary | ICD-10-CM | POA: Diagnosis not present

## 2023-09-05 DIAGNOSIS — I110 Hypertensive heart disease with heart failure: Secondary | ICD-10-CM | POA: Diagnosis not present

## 2023-09-05 NOTE — Progress Notes (Incomplete)
 HEART & VASCULAR TRANSITION OF CARE CONSULT NOTE     Referring Physician: Benjamine Aland, MD   Chief Complaint: HFpEF with RV failure and pulmonary hypertension  HPI: Referred to clinic by Dr. Maree with [facility] for heart failure consultation.   Debbie Mullins is a 68 y.o. female with history of COPD, chronic respiratory failure on 4L home O2, tobacco use, DM II, HTN, HFpEF w/ RV failure, iron  deficiency anemia. She has not followed with a Cardiologist.   Has history of HFpEF with evidence of RV dysfunction on echo dating back to 2021.   Patient was admitted in June 2025 with acute on chronic respiratory failure d/t combination of COPDE and acute on chronic CHF. Echo with EF 60-65%, interventricular septum flattened in systole and diastole c/w RV pressure and volume overload, RV severely dilated and severely reduced, RVSP 103 mmHg, moderate TR. She was diuresed and treated with abx, steroids and bronchodilators. Discharged to SNF for rehab.   She is here today for post hospital CHF follow-up.       Past Medical History:  Diagnosis Date   Anemia    Asthma    CHF (congestive heart failure) (HCC)    takes Furosemide  daily    Chronic respiratory failure with hypoxia (HCC) 03/02/2019   COPD (chronic obstructive pulmonary disease) (HCC)    Albuterol  inhaler prn;SIngulair  at night   Depression    takes Wellbutrin  daily   Diabetes mellitus    takes Metformin  daily   GERD (gastroesophageal reflux disease)    takes Nexium  daily   History of colon polyps    benign   Hypertension    takes Lisinopril  daily   Iron  deficiency anemia 08/01/2017   Joint pain    Joint swelling    Leukocytosis 02/09/2011   Neck pain    HNP   Normocytic anemia 02/09/2011   Peripheral edema    takes Furosemide  daily   Pneumonia    many yrs ago   Pulmonary nodules/lesions, multiple 08/09/2013   Shortness of breath dyspnea    with exertion   Thrombocytosis 02/09/2011   Urinary frequency     Urinary urgency    Weakness    numbness and tingling in both hands    Current Outpatient Medications  Medication Sig Dispense Refill   acetaminophen  (TYLENOL ) 500 MG tablet Take 500-1,000 mg by mouth every 6 (six) hours as needed for mild pain (pain score 1-3).     albuterol  (VENTOLIN  HFA) 108 (90 Base) MCG/ACT inhaler Inhale 1-2 puffs into the lungs every 4 (four) hours as needed for wheezing or shortness of breath. 8 g 5   ANORO ELLIPTA  62.5-25 MCG/ACT AEPB Inhale 1 puff into the lungs daily. 180 each 3   aspirin  EC 325 MG tablet Take 1 tablet (325 mg total) by mouth every morning. 30 tablet 0   atorvastatin  (LIPITOR) 10 MG tablet Take 10 mg by mouth daily.     enalapril  (VASOTEC ) 2.5 MG tablet Take 1 tablet (2.5 mg total) by mouth daily. 30 tablet 0   esomeprazole  (NEXIUM ) 40 MG capsule Take 40 mg by mouth daily.     furosemide  (LASIX ) 40 MG tablet Take 1 tablet (40 mg total) by mouth daily. 30 tablet 1   JARDIANCE  25 MG TABS tablet Take 25 mg by mouth daily.     metFORMIN  (GLUCOPHAGE ) 500 MG tablet Take 1.5 tablets (750 mg total) by mouth 2 (two) times daily with a meal. (Patient taking differently: Take 500 mg  by mouth 2 (two) times daily with a meal.)     ondansetron  (ZOFRAN -ODT) 4 MG disintegrating tablet Take 1 tablet (4 mg total) by mouth every 8 (eight) hours as needed for nausea or vomiting. 20 tablet 0   spironolactone  (ALDACTONE ) 25 MG tablet Take 0.5 tablets (12.5 mg total) by mouth daily. 15 tablet 0   sucralfate  (CARAFATE ) 1 g tablet Take 1 tablet (1 g total) by mouth 4 (four) times daily -  with meals and at bedtime. 90 tablet 0   No current facility-administered medications for this visit.    No Known Allergies    Social History   Socioeconomic History   Marital status: Single    Spouse name: Not on file   Number of children: 0   Years of education: Not on file   Highest education level: 12th grade  Occupational History   Not on file  Tobacco Use   Smoking  status: Every Day    Current packs/day: 0.50    Average packs/day: 0.7 packs/day for 90.5 years (65.7 ttl pk-yrs)    Types: Cigarettes    Start date: 1976   Smokeless tobacco: Never   Tobacco comments:    Pt states she is smoking less than 0.25 a day now    pt currently trying to quit with gum/losenges  Vaping Use   Vaping status: Never Used  Substance and Sexual Activity   Alcohol use: Yes    Comment: occ-mixed drinks   Drug use: No   Sexual activity: Never  Other Topics Concern   Not on file  Social History Narrative   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 COUSINS TRANSPORTS HER.   Social Drivers of Corporate investment banker Strain: Not on file  Food Insecurity: No Food Insecurity (08/15/2023)   Hunger Vital Sign    Worried About Running Out of Food in the Last Year: Never true    Ran Out of Food in the Last Year: Never true  Transportation Needs: No Transportation Needs (08/15/2023)   PRAPARE - Administrator, Civil Service (Medical): No    Lack of Transportation (Non-Medical): No  Physical Activity: Not on file  Stress: Not on file  Social Connections: Unknown (08/12/2023)   Social Connection and Isolation Panel    Frequency of Communication with Friends and Family: More than three times a week    Frequency of Social Gatherings with Friends and Family: Three times a week    Attends Religious Services: More than 4 times per year    Active Member of Clubs or Organizations: Yes    Attends Banker Meetings: 1 to 4 times per year    Marital Status: Patient declined  Intimate Partner Violence: Not At Risk (08/12/2023)   Humiliation, Afraid, Rape, and Kick questionnaire    Fear of Current or Ex-Partner: No    Emotionally Abused: No    Physically Abused: No    Sexually Abused: No      Family History  Problem Relation Age of Onset   Heart failure Mother    Cancer Maternal Grandmother    Colon cancer Neg Hx    Colon polyps  Neg Hx    BRCA 1/2 Neg Hx    Breast cancer Neg Hx     There were no vitals filed for this visit.  PHYSICAL EXAM: General:  Well appearing. No respiratory difficulty HEENT: normal Neck: supple. no JVD. Carotids 2+ bilat; no bruits.  No lymphadenopathy or thryomegaly appreciated. Cor: PMI nondisplaced. Regular rate & rhythm. No rubs, gallops or murmurs. Lungs: clear Abdomen: soft, nontender, nondistended. No hepatosplenomegaly. No bruits or masses. Good bowel sounds. Extremities: no cyanosis, clubbing, rash, edema Neuro: alert & oriented x 3, cranial nerves grossly intact. moves all 4 extremities w/o difficulty. Affect pleasant.  ECG:   ASSESSMENT & PLAN:  HFpEF with RV failure  2. Pulmonary hypertension  3. Chronic respiratory failure on home O2  4. COPD   Referred to HFSW (PCP, Medications, Transportation, ETOH Abuse, Drug Abuse, Insurance, Financial ): Yes or No Refer to Pharmacy: Yes or No Refer to Home Health: Yes on No Refer to Advanced Heart Failure Clinic: Yes or no  Refer to General Cardiology: Yes or No  Follow up

## 2023-09-06 DIAGNOSIS — J9610 Chronic respiratory failure, unspecified whether with hypoxia or hypercapnia: Secondary | ICD-10-CM | POA: Diagnosis not present

## 2023-09-11 DIAGNOSIS — J4489 Other specified chronic obstructive pulmonary disease: Secondary | ICD-10-CM | POA: Diagnosis not present

## 2023-09-11 DIAGNOSIS — Z9981 Dependence on supplemental oxygen: Secondary | ICD-10-CM | POA: Diagnosis not present

## 2023-09-11 DIAGNOSIS — Z981 Arthrodesis status: Secondary | ICD-10-CM | POA: Diagnosis not present

## 2023-09-11 DIAGNOSIS — I110 Hypertensive heart disease with heart failure: Secondary | ICD-10-CM | POA: Diagnosis not present

## 2023-09-11 DIAGNOSIS — Z96653 Presence of artificial knee joint, bilateral: Secondary | ICD-10-CM | POA: Diagnosis not present

## 2023-09-11 DIAGNOSIS — Z7982 Long term (current) use of aspirin: Secondary | ICD-10-CM | POA: Diagnosis not present

## 2023-09-11 DIAGNOSIS — R918 Other nonspecific abnormal finding of lung field: Secondary | ICD-10-CM | POA: Diagnosis not present

## 2023-09-11 DIAGNOSIS — Z9089 Acquired absence of other organs: Secondary | ICD-10-CM | POA: Diagnosis not present

## 2023-09-11 DIAGNOSIS — M4712 Other spondylosis with myelopathy, cervical region: Secondary | ICD-10-CM | POA: Diagnosis not present

## 2023-09-11 DIAGNOSIS — J9621 Acute and chronic respiratory failure with hypoxia: Secondary | ICD-10-CM | POA: Diagnosis not present

## 2023-09-11 DIAGNOSIS — I5032 Chronic diastolic (congestive) heart failure: Secondary | ICD-10-CM | POA: Diagnosis not present

## 2023-09-11 DIAGNOSIS — E1122 Type 2 diabetes mellitus with diabetic chronic kidney disease: Secondary | ICD-10-CM | POA: Diagnosis not present

## 2023-09-11 DIAGNOSIS — N181 Chronic kidney disease, stage 1: Secondary | ICD-10-CM | POA: Diagnosis not present

## 2023-09-11 DIAGNOSIS — D509 Iron deficiency anemia, unspecified: Secondary | ICD-10-CM | POA: Diagnosis not present

## 2023-09-11 DIAGNOSIS — E873 Alkalosis: Secondary | ICD-10-CM | POA: Diagnosis not present

## 2023-09-11 DIAGNOSIS — I2723 Pulmonary hypertension due to lung diseases and hypoxia: Secondary | ICD-10-CM | POA: Diagnosis not present

## 2023-09-11 DIAGNOSIS — F17210 Nicotine dependence, cigarettes, uncomplicated: Secondary | ICD-10-CM | POA: Diagnosis not present

## 2023-09-11 DIAGNOSIS — E871 Hypo-osmolality and hyponatremia: Secondary | ICD-10-CM | POA: Diagnosis not present

## 2023-09-11 DIAGNOSIS — K219 Gastro-esophageal reflux disease without esophagitis: Secondary | ICD-10-CM | POA: Diagnosis not present

## 2023-09-11 DIAGNOSIS — Z860100 Personal history of colon polyps, unspecified: Secondary | ICD-10-CM | POA: Diagnosis not present

## 2023-09-11 DIAGNOSIS — E785 Hyperlipidemia, unspecified: Secondary | ICD-10-CM | POA: Diagnosis not present

## 2023-09-12 ENCOUNTER — Other Ambulatory Visit: Payer: Self-pay

## 2023-09-12 DIAGNOSIS — I5032 Chronic diastolic (congestive) heart failure: Secondary | ICD-10-CM | POA: Diagnosis not present

## 2023-09-12 DIAGNOSIS — F17210 Nicotine dependence, cigarettes, uncomplicated: Secondary | ICD-10-CM

## 2023-09-12 DIAGNOSIS — Z860100 Personal history of colon polyps, unspecified: Secondary | ICD-10-CM | POA: Diagnosis not present

## 2023-09-12 DIAGNOSIS — D509 Iron deficiency anemia, unspecified: Secondary | ICD-10-CM | POA: Diagnosis not present

## 2023-09-12 DIAGNOSIS — E785 Hyperlipidemia, unspecified: Secondary | ICD-10-CM | POA: Diagnosis not present

## 2023-09-12 DIAGNOSIS — I2723 Pulmonary hypertension due to lung diseases and hypoxia: Secondary | ICD-10-CM | POA: Diagnosis not present

## 2023-09-12 DIAGNOSIS — I110 Hypertensive heart disease with heart failure: Secondary | ICD-10-CM | POA: Diagnosis not present

## 2023-09-12 DIAGNOSIS — Z9981 Dependence on supplemental oxygen: Secondary | ICD-10-CM | POA: Diagnosis not present

## 2023-09-12 DIAGNOSIS — J9621 Acute and chronic respiratory failure with hypoxia: Secondary | ICD-10-CM | POA: Diagnosis not present

## 2023-09-12 DIAGNOSIS — Z9089 Acquired absence of other organs: Secondary | ICD-10-CM | POA: Diagnosis not present

## 2023-09-12 DIAGNOSIS — J4489 Other specified chronic obstructive pulmonary disease: Secondary | ICD-10-CM | POA: Diagnosis not present

## 2023-09-12 DIAGNOSIS — Z122 Encounter for screening for malignant neoplasm of respiratory organs: Secondary | ICD-10-CM

## 2023-09-12 DIAGNOSIS — E873 Alkalosis: Secondary | ICD-10-CM | POA: Diagnosis not present

## 2023-09-12 DIAGNOSIS — Z7982 Long term (current) use of aspirin: Secondary | ICD-10-CM | POA: Diagnosis not present

## 2023-09-12 DIAGNOSIS — Z96653 Presence of artificial knee joint, bilateral: Secondary | ICD-10-CM | POA: Diagnosis not present

## 2023-09-12 DIAGNOSIS — Z87891 Personal history of nicotine dependence: Secondary | ICD-10-CM

## 2023-09-12 DIAGNOSIS — M4712 Other spondylosis with myelopathy, cervical region: Secondary | ICD-10-CM | POA: Diagnosis not present

## 2023-09-12 DIAGNOSIS — R918 Other nonspecific abnormal finding of lung field: Secondary | ICD-10-CM | POA: Diagnosis not present

## 2023-09-12 DIAGNOSIS — K219 Gastro-esophageal reflux disease without esophagitis: Secondary | ICD-10-CM | POA: Diagnosis not present

## 2023-09-12 DIAGNOSIS — E1122 Type 2 diabetes mellitus with diabetic chronic kidney disease: Secondary | ICD-10-CM | POA: Diagnosis not present

## 2023-09-12 DIAGNOSIS — N181 Chronic kidney disease, stage 1: Secondary | ICD-10-CM | POA: Diagnosis not present

## 2023-09-12 DIAGNOSIS — E871 Hypo-osmolality and hyponatremia: Secondary | ICD-10-CM | POA: Diagnosis not present

## 2023-09-12 DIAGNOSIS — Z981 Arthrodesis status: Secondary | ICD-10-CM | POA: Diagnosis not present

## 2023-09-16 ENCOUNTER — Ambulatory Visit (HOSPITAL_COMMUNITY)
Admission: RE | Admit: 2023-09-16 | Discharge: 2023-09-16 | Disposition: A | Discharge status: Home / Self Care | Source: Ambulatory Visit | Attending: Adult Health | Admitting: Adult Health

## 2023-09-16 ENCOUNTER — Other Ambulatory Visit (HOSPITAL_COMMUNITY): Payer: Self-pay

## 2023-09-16 ENCOUNTER — Ambulatory Visit (HOSPITAL_COMMUNITY): Payer: Self-pay | Admitting: Adult Health

## 2023-09-16 VITALS — BP 112/64 | HR 96 | Wt 132.6 lb

## 2023-09-16 DIAGNOSIS — Z720 Tobacco use: Secondary | ICD-10-CM

## 2023-09-16 DIAGNOSIS — Z716 Tobacco abuse counseling: Secondary | ICD-10-CM | POA: Diagnosis not present

## 2023-09-16 DIAGNOSIS — I509 Heart failure, unspecified: Secondary | ICD-10-CM

## 2023-09-16 DIAGNOSIS — I5032 Chronic diastolic (congestive) heart failure: Secondary | ICD-10-CM | POA: Diagnosis not present

## 2023-09-16 DIAGNOSIS — Z9981 Dependence on supplemental oxygen: Secondary | ICD-10-CM | POA: Diagnosis not present

## 2023-09-16 DIAGNOSIS — Z7984 Long term (current) use of oral hypoglycemic drugs: Secondary | ICD-10-CM | POA: Insufficient documentation

## 2023-09-16 DIAGNOSIS — F17210 Nicotine dependence, cigarettes, uncomplicated: Secondary | ICD-10-CM | POA: Insufficient documentation

## 2023-09-16 DIAGNOSIS — Z79899 Other long term (current) drug therapy: Secondary | ICD-10-CM | POA: Diagnosis not present

## 2023-09-16 DIAGNOSIS — E785 Hyperlipidemia, unspecified: Secondary | ICD-10-CM | POA: Insufficient documentation

## 2023-09-16 DIAGNOSIS — J4489 Other specified chronic obstructive pulmonary disease: Secondary | ICD-10-CM | POA: Insufficient documentation

## 2023-09-16 DIAGNOSIS — I2721 Secondary pulmonary arterial hypertension: Secondary | ICD-10-CM

## 2023-09-16 DIAGNOSIS — J441 Chronic obstructive pulmonary disease with (acute) exacerbation: Secondary | ICD-10-CM | POA: Diagnosis not present

## 2023-09-16 DIAGNOSIS — J9611 Chronic respiratory failure with hypoxia: Secondary | ICD-10-CM | POA: Insufficient documentation

## 2023-09-16 DIAGNOSIS — I110 Hypertensive heart disease with heart failure: Secondary | ICD-10-CM | POA: Insufficient documentation

## 2023-09-16 DIAGNOSIS — R609 Edema, unspecified: Secondary | ICD-10-CM | POA: Diagnosis not present

## 2023-09-16 DIAGNOSIS — E1169 Type 2 diabetes mellitus with other specified complication: Secondary | ICD-10-CM | POA: Diagnosis not present

## 2023-09-16 LAB — BASIC METABOLIC PANEL WITH GFR
Anion gap: 13 (ref 5–15)
BUN: 26 mg/dL — ABNORMAL HIGH (ref 8–23)
CO2: 28 mmol/L (ref 22–32)
Calcium: 9.1 mg/dL (ref 8.9–10.3)
Chloride: 98 mmol/L (ref 98–111)
Creatinine, Ser: 1.03 mg/dL — ABNORMAL HIGH (ref 0.44–1.00)
GFR, Estimated: 60 mL/min — ABNORMAL LOW (ref >60)
Glucose, Bld: 106 mg/dL — ABNORMAL HIGH (ref 70–99)
Potassium: 4 mmol/L (ref 3.5–5.1)
Sodium: 139 mmol/L (ref 135–145)

## 2023-09-16 LAB — HEPATITIS C ANTIBODY: HCV Ab: NONREACTIVE

## 2023-09-16 LAB — SEDIMENTATION RATE: Sed Rate: 15 mm/h (ref 0–22)

## 2023-09-16 LAB — HEPATITIS A ANTIBODY, IGM: Hep A IgM: NONREACTIVE

## 2023-09-16 NOTE — H&P (View-Only) (Signed)
 HEART & VASCULAR TRANSITION OF CARE CONSULT NOTE     Referring Physician:  PCP: Benjamine Aland, MD  Cardiology: Dr Alveta Pulmopnary: Dr charl   Chief Complaint: Heart Failure   HPI: Referred to clinic by Dr Franchot for heart failure consultation.   Debbie Mullins is a 68 y.o. female with a history of HFpEF, PAH, COPD, Chronnic Oxygen , HTN, tobacco abuse, and HLD.   Former patient of Dr Alveta. Diagnosed with PAH in 2013. [facility] - PVR 8. Echo EF preserved, RVSP 60.   Follow by Pulmonary for COPD/PAH. She was seen earlier this year. Recommendations to wear oxygen  more.   Admitted 08/12/23 to [facility] with A/C HFpEF. Diuresed with IV lasix . Echo EF 60-65% RV severely reduced, RVSP 100, and mod TR. Discharged with SNF on 08/19/2023.   She has been discharged from SNF a few weeks ago.   Overall feeling fine. SOB with when she is doing activities in the house. Denies PND/Orthopnea. She uses oxygen  usually when she is doing house work and at bedtime. Appetite ok. No fever or chills. Weight at home has been stable. Taking all medications. Out of jardiance  x 4 weeks. Smokes 1/2 PPD. Her cousin lives with her. Disabled from joint issues.   Cardiac Testing  Echo 2025  Left Ventricle: LV 60-65% on, by estimation, is 60  to 65%. Grade I DD Right Ventricle: Severely reduced. RVSP 103.0 mmHg.  LA/RA: normal  Pericardium: Trivial pericardial effusion is present. The pericardial  effusion is circumferential.  Mitral Valve: normal  Tricuspid Valve: Regurgitation is moderate . No evidence of tricuspid stenosis.  Aortic Valve: The aortic valve is tricuspid. There is mild calcification  of the aortic valve. There is mild thickening of the aortic valve. There  is mild aortic valve annular calcification. Aortic valve regurgitation is  not visualized. No aortic stenosis   is present. Aortic valve mean gradient measures 7.0 mmHg. Aortic valve  peak gradient measures 13.0 mmHg. Aortic valve area, by  VTI measures 2.22  cm.    Past Medical History:  Diagnosis Date   Anemia    Asthma    CHF (congestive heart failure) (HCC)    takes Furosemide  daily    Chronic respiratory failure with hypoxia (HCC) 03/02/2019   COPD (chronic obstructive pulmonary disease) (HCC)    Albuterol  inhaler prn;SIngulair  at night   Depression    takes Wellbutrin  daily   Diabetes mellitus    takes Metformin  daily   GERD (gastroesophageal reflux disease)    takes Nexium  daily   History of colon polyps    benign   Hypertension    takes Lisinopril  daily   Iron  deficiency anemia 08/01/2017   Joint pain    Joint swelling    Leukocytosis 02/09/2011   Neck pain    HNP   Normocytic anemia 02/09/2011   Peripheral edema    takes Furosemide  daily   Pneumonia    many yrs ago   Pulmonary nodules/lesions, multiple 08/09/2013   Shortness of breath dyspnea    with exertion   Thrombocytosis 02/09/2011   Urinary frequency    Urinary urgency    Weakness    numbness and tingling in both hands    Current Outpatient Medications  Medication Sig Dispense Refill   acetaminophen  (TYLENOL ) 500 MG tablet Take 500-1,000 mg by mouth every 6 (six) hours as needed for mild pain (pain score 1-3).     albuterol  (VENTOLIN  HFA) 108 (90 Base) MCG/ACT inhaler Inhale 1-2 puffs  into the lungs every 4 (four) hours as needed for wheezing or shortness of breath. 8 g 5   ANORO ELLIPTA  62.5-25 MCG/ACT AEPB Inhale 1 puff into the lungs daily. 180 each 3   aspirin  EC 325 MG tablet Take 1 tablet (325 mg total) by mouth every morning. 30 tablet 0   atorvastatin  (LIPITOR) 10 MG tablet Take 10 mg by mouth daily.     enalapril  (VASOTEC ) 2.5 MG tablet Take 1 tablet (2.5 mg total) by mouth daily. 30 tablet 0   esomeprazole  (NEXIUM ) 40 MG capsule Take 40 mg by mouth daily.     furosemide  (LASIX ) 40 MG tablet Take 1 tablet (40 mg total) by mouth daily. 30 tablet 1   ondansetron  (ZOFRAN -ODT) 4 MG disintegrating tablet Take 1 tablet (4 mg  total) by mouth every 8 (eight) hours as needed for nausea or vomiting. 20 tablet 0   spironolactone  (ALDACTONE ) 25 MG tablet Take 0.5 tablets (12.5 mg total) by mouth daily. 15 tablet 0   sucralfate  (CARAFATE ) 1 g tablet Take 1 tablet (1 g total) by mouth 4 (four) times daily -  with meals and at bedtime. 90 tablet 0   metFORMIN  (GLUCOPHAGE ) 500 MG tablet Take 1.5 tablets (750 mg total) by mouth 2 (two) times daily with a meal. (Patient taking differently: Take 500 mg by mouth 2 (two) times daily with a meal.)     No current facility-administered medications for this encounter.    No Known Allergies    Social History   Socioeconomic History   Marital status: Single    Spouse name: Not on file   Number of children: 0   Years of education: Not on file   Highest education level: 12th grade  Occupational History   Not on file  Tobacco Use   Smoking status: Every Day    Current packs/day: 0.50    Average packs/day: 0.7 packs/day for 90.5 years (65.8 ttl pk-yrs)    Types: Cigarettes    Start date: 1976   Smokeless tobacco: Never   Tobacco comments:    Pt states she is smoking less than 0.25 a day now    pt currently trying to quit with gum/losenges  Vaping Use   Vaping status: Never Used  Substance and Sexual Activity   Alcohol use: Yes    Comment: occ-mixed drinks   Drug use: No   Sexual activity: Never  Other Topics Concern   Not on file  Social History Narrative   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 COUSINS TRANSPORTS HER.   Social Drivers of Corporate investment banker Strain: Not on file  Food Insecurity: No Food Insecurity (08/15/2023)   Hunger Vital Sign    Worried About Running Out of Food in the Last Year: Never true    Ran Out of Food in the Last Year: Never true  Transportation Needs: No Transportation Needs (08/15/2023)   PRAPARE - Administrator, Civil Service (Medical): No    Lack of Transportation (Non-Medical): No   Physical Activity: Not on file  Stress: Not on file  Social Connections: Unknown (08/12/2023)   Social Connection and Isolation Panel    Frequency of Communication with Friends and Family: More than three times a week    Frequency of Social Gatherings with Friends and Family: Three times a week    Attends Religious Services: More than 4 times per year    Active Member of Clubs or  Organizations: Yes    Attends Banker Meetings: 1 to 4 times per year    Marital Status: Patient declined  Intimate Partner Violence: Not At Risk (08/12/2023)   Humiliation, Afraid, Rape, and Kick questionnaire    Fear of Current or Ex-Partner: No    Emotionally Abused: No    Physically Abused: No    Sexually Abused: No      Family History  Problem Relation Age of Onset   Heart failure Mother    Cancer Maternal Grandmother    Colon cancer Neg Hx    Colon polyps Neg Hx    BRCA 1/2 Neg Hx    Breast cancer Neg Hx     Vitals:   09/16/23 0956  BP: 112/64  Pulse: 96  SpO2: 90%  Weight: 60.1 kg (132 lb 9.6 oz)   Wt Readings from Last 3 Encounters:  09/16/23 60.1 kg (132 lb 9.6 oz)  08/19/23 66.2 kg (146 lb)  06/18/23 65 kg (143 lb 4.8 oz)    PHYSICAL EXAM: General:   No resp difficulty Neck: supple. no JVD.  Cor: PMI nondisplaced. Regular rate & rhythm. No rubs, gallops or murmurs. Lungs: clear. On room air.  Abdomen: soft, nontender, nondistended.  Extremities: no cyanosis, clubbing, rash, edema Neuro: alert & oriented x3   ASSESSMENT & PLAN: 1. Chronic HFpE, RV Failure  Echo EF 60-65% RV severely reduced, RVSP 100, and mod TR. NYHA III. Appears euvolemic  GDMT  Diuretic- Continue lasix  40 mg daily  BB-Hold off.  Ace/ARB/ARNI- Currently on enalapril  2.5 mg daily. Continue for now.  MRA- Continue 12.5 mg spiro daily.  SGLT2i- Restart jardiance  125 mg daily  Check BMET   2. PAH Diagnosed back in 2013. PVR at that time was 8. 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  2025 CTA- negative PE. HIV NR.   -Most recent Echo RVSP > 100. Will order sed rate, ANA, ANCA, Hepatitis Panel, antiscleroderma, Rheumatoid Factor. Set up [facility] to further assess.  -She is using oxygen  as needed. I have asked her to use oxygen .  Also needs home sleep study.   Informed Consent   Shared Decision Making/Informed Consent The risks [stroke (1 in 1000), death (1 in 1000), kidney failure [usually temporary] (1 in 500), bleeding (1 in 200), allergic reaction [possibly serious] (1 in 200)], benefits (diagnostic support and management of coronary artery disease) and alternatives of a cardiac catheterization were discussed in detail with Ms. Nulty and she is willing to proceed.    3. Tobacco Abuse  Discussed smoking cessation.    Referred to HFSW (PCP, Medications, Transportation, ETOH Abuse, Drug Abuse, Insurance, Financial ):  No Refer to Pharmacy: No Refer to Home Health: No Refer to Advanced Heart Failure Clinic: Yes - Dr Cherrie  Refer to General Cardiology: No  Follow up 3-4 weeks Dr Cherrie  Dereck Agerton NP-C  10:00 AM

## 2023-09-16 NOTE — Progress Notes (Signed)
 HEART & VASCULAR TRANSITION OF CARE CONSULT NOTE     Referring Physician:  PCP: Benjamine Aland, MD  Cardiology: Dr Alveta Pulmopnary: Dr charl   Chief Complaint: Heart Failure   HPI: Referred to clinic by Dr Franchot for heart failure consultation.   Debbie Mullins is a 68 y.o. female with a history of HFpEF, PAH, COPD, Chronnic Oxygen , HTN, tobacco abuse, and HLD.   Former patient of Dr Alveta. Diagnosed with PAH in 2013. RHC - PVR 8. Echo EF preserved, RVSP 60.   Follow by Pulmonary for COPD/PAH. She was seen earlier this year. Recommendations to wear oxygen  more.   Admitted 08/12/23 to [facility] with A/C HFpEF. Diuresed with IV lasix . Echo EF 60-65% RV severely reduced, RVSP 100, and mod TR. Discharged with SNF on 08/19/2023.   She has been discharged from SNF a few weeks ago.   Overall feeling fine. SOB with when she is doing activities in the house. Denies PND/Orthopnea. She uses oxygen  usually when she is doing house work and at bedtime. Appetite ok. No fever or chills. Weight at home has been stable. Taking all medications. Out of jardiance  x 4 weeks. Smokes 1/2 PPD. Her cousin lives with her. Disabled from joint issues.   Cardiac Testing  Echo 2025  Left Ventricle: LV 60-65% on, by estimation, is 60  to 65%. Grade I DD Right Ventricle: Severely reduced. RVSP 103.0 mmHg.  LA/RA: normal  Pericardium: Trivial pericardial effusion is present. The pericardial  effusion is circumferential.  Mitral Valve: normal  Tricuspid Valve: Regurgitation is moderate . No evidence of tricuspid stenosis.  Aortic Valve: The aortic valve is tricuspid. There is mild calcification  of the aortic valve. There is mild thickening of the aortic valve. There  is mild aortic valve annular calcification. Aortic valve regurgitation is  not visualized. No aortic stenosis   is present. Aortic valve mean gradient measures 7.0 mmHg. Aortic valve  peak gradient measures 13.0 mmHg. Aortic valve area, by  VTI measures 2.22  cm.    Past Medical History:  Diagnosis Date   Anemia    Asthma    CHF (congestive heart failure) (HCC)    takes Furosemide  daily    Chronic respiratory failure with hypoxia (HCC) 03/02/2019   COPD (chronic obstructive pulmonary disease) (HCC)    Albuterol  inhaler prn;SIngulair  at night   Depression    takes Wellbutrin  daily   Diabetes mellitus    takes Metformin  daily   GERD (gastroesophageal reflux disease)    takes Nexium  daily   History of colon polyps    benign   Hypertension    takes Lisinopril  daily   Iron  deficiency anemia 08/01/2017   Joint pain    Joint swelling    Leukocytosis 02/09/2011   Neck pain    HNP   Normocytic anemia 02/09/2011   Peripheral edema    takes Furosemide  daily   Pneumonia    many yrs ago   Pulmonary nodules/lesions, multiple 08/09/2013   Shortness of breath dyspnea    with exertion   Thrombocytosis 02/09/2011   Urinary frequency    Urinary urgency    Weakness    numbness and tingling in both hands    Current Outpatient Medications  Medication Sig Dispense Refill   acetaminophen  (TYLENOL ) 500 MG tablet Take 500-1,000 mg by mouth every 6 (six) hours as needed for mild pain (pain score 1-3).     albuterol  (VENTOLIN  HFA) 108 (90 Base) MCG/ACT inhaler Inhale 1-2 puffs  into the lungs every 4 (four) hours as needed for wheezing or shortness of breath. 8 g 5   ANORO ELLIPTA  62.5-25 MCG/ACT AEPB Inhale 1 puff into the lungs daily. 180 each 3   aspirin  EC 325 MG tablet Take 1 tablet (325 mg total) by mouth every morning. 30 tablet 0   atorvastatin  (LIPITOR) 10 MG tablet Take 10 mg by mouth daily.     enalapril  (VASOTEC ) 2.5 MG tablet Take 1 tablet (2.5 mg total) by mouth daily. 30 tablet 0   esomeprazole  (NEXIUM ) 40 MG capsule Take 40 mg by mouth daily.     furosemide  (LASIX ) 40 MG tablet Take 1 tablet (40 mg total) by mouth daily. 30 tablet 1   ondansetron  (ZOFRAN -ODT) 4 MG disintegrating tablet Take 1 tablet (4 mg  total) by mouth every 8 (eight) hours as needed for nausea or vomiting. 20 tablet 0   spironolactone  (ALDACTONE ) 25 MG tablet Take 0.5 tablets (12.5 mg total) by mouth daily. 15 tablet 0   sucralfate  (CARAFATE ) 1 g tablet Take 1 tablet (1 g total) by mouth 4 (four) times daily -  with meals and at bedtime. 90 tablet 0   metFORMIN  (GLUCOPHAGE ) 500 MG tablet Take 1.5 tablets (750 mg total) by mouth 2 (two) times daily with a meal. (Patient taking differently: Take 500 mg by mouth 2 (two) times daily with a meal.)     No current facility-administered medications for this encounter.    No Known Allergies    Social History   Socioeconomic History   Marital status: Single    Spouse name: Not on file   Number of children: 0   Years of education: Not on file   Highest education level: 12th grade  Occupational History   Not on file  Tobacco Use   Smoking status: Every Day    Current packs/day: 0.50    Average packs/day: 0.7 packs/day for 90.5 years (65.8 ttl pk-yrs)    Types: Cigarettes    Start date: 1976   Smokeless tobacco: Never   Tobacco comments:    Pt states she is smoking less than 0.25 a day now    pt currently trying to quit with gum/losenges  Vaping Use   Vaping status: Never Used  Substance and Sexual Activity   Alcohol use: Yes    Comment: occ-mixed drinks   Drug use: No   Sexual activity: Never  Other Topics Concern   Not on file  Social History Narrative   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 COUSINS TRANSPORTS HER.   Social Drivers of Corporate investment banker Strain: Not on file  Food Insecurity: No Food Insecurity (08/15/2023)   Hunger Vital Sign    Worried About Running Out of Food in the Last Year: Never true    Ran Out of Food in the Last Year: Never true  Transportation Needs: No Transportation Needs (08/15/2023)   PRAPARE - Administrator, Civil Service (Medical): No    Lack of Transportation (Non-Medical): No   Physical Activity: Not on file  Stress: Not on file  Social Connections: Unknown (08/12/2023)   Social Connection and Isolation Panel    Frequency of Communication with Friends and Family: More than three times a week    Frequency of Social Gatherings with Friends and Family: Three times a week    Attends Religious Services: More than 4 times per year    Active Member of Clubs or  Organizations: Yes    Attends Banker Meetings: 1 to 4 times per year    Marital Status: Patient declined  Intimate Partner Violence: Not At Risk (08/12/2023)   Humiliation, Afraid, Rape, and Kick questionnaire    Fear of Current or Ex-Partner: No    Emotionally Abused: No    Physically Abused: No    Sexually Abused: No      Family History  Problem Relation Age of Onset   Heart failure Mother    Cancer Maternal Grandmother    Colon cancer Neg Hx    Colon polyps Neg Hx    BRCA 1/2 Neg Hx    Breast cancer Neg Hx     Vitals:   09/16/23 0956  BP: 112/64  Pulse: 96  SpO2: 90%  Weight: 60.1 kg (132 lb 9.6 oz)   Wt Readings from Last 3 Encounters:  09/16/23 60.1 kg (132 lb 9.6 oz)  08/19/23 66.2 kg (146 lb)  06/18/23 65 kg (143 lb 4.8 oz)    PHYSICAL EXAM: General:   No resp difficulty Neck: supple. no JVD.  Cor: PMI nondisplaced. Regular rate & rhythm. No rubs, gallops or murmurs. Lungs: clear. On room air.  Abdomen: soft, nontender, nondistended.  Extremities: no cyanosis, clubbing, rash, edema Neuro: alert & oriented x3   ASSESSMENT & PLAN: 1. Chronic HFpE, RV Failure  Echo EF 60-65% RV severely reduced, RVSP 100, and mod TR. NYHA III. Appears euvolemic  GDMT  Diuretic- Continue lasix  40 mg daily  BB-Hold off.  Ace/ARB/ARNI- Currently on enalapril  2.5 mg daily. Continue for now.  MRA- Continue 12.5 mg spiro daily.  SGLT2i- Restart jardiance  125 mg daily  Check BMET   2. PAH Diagnosed back in 2013. PVR at that time was 8. 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  2025 CTA- negative PE. HIV NR.   -Most recent Echo RVSP > 100. Will order sed rate, ANA, ANCA, Hepatitis Panel, antiscleroderma, Rheumatoid Factor. Set up RHC to further assess.  -She is using oxygen  as needed. I have asked her to use oxygen .  Also needs home sleep study.   Informed Consent   Shared Decision Making/Informed Consent The risks [stroke (1 in 1000), death (1 in 1000), kidney failure [usually temporary] (1 in 500), bleeding (1 in 200), allergic reaction [possibly serious] (1 in 200)], benefits (diagnostic support and management of coronary artery disease) and alternatives of a cardiac catheterization were discussed in detail with Ms. Ritchey and she is willing to proceed.    3. Tobacco Abuse  Discussed smoking cessation.    Referred to HFSW (PCP, Medications, Transportation, ETOH Abuse, Drug Abuse, Insurance, Financial ):  No Refer to Pharmacy: No Refer to Home Health: No Refer to Advanced [facility] Failure Clinic: Yes - Dr Cherrie  Refer to General Cardiology: No  Follow up 3-4 weeks Dr Cherrie  Loan Oguin NP-C  10:00 AM

## 2023-09-16 NOTE — Progress Notes (Signed)
 ITAMAR home sleep study given to patient, all instructions explained, waiver signed, and CLOUDPAT registration complete.

## 2023-09-16 NOTE — Patient Instructions (Addendum)
 There has been no changes to your medications.  Labs done today, your results will be available in MyChart, we will contact you for abnormal readings.  You are scheduled for a Cardiac Catheterization on Thursday, July 17 with Dr. Toribio Fuel.  1. Please arrive at the [facility] (Main Entrance A) at [facility]: [address] [place], [place] [address] at 7:00 AM (This time is 2 hour(s) before your procedure to ensure your preparation).   Free valet parking service is available. You will check in at ADMITTING. The support person will be asked to wait in the waiting room.  It is OK to have someone drop you off and come back when you are ready to be discharged.    Special note: Every effort is made to have your procedure done on time. Please understand that emergencies sometimes delay scheduled procedures.  2. Diet: Do not eat solid foods after midnight.  The patient may have clear liquids until 5am upon the day of the procedure.  3. Medication instructions in preparation for your procedure:   Contrast Allergy: No   HOLD YOUR LASIX , METFORMIN  AND SPIRONOLACTONE  THE MORNING OF YOUR PROCEDURE.  On the morning of your procedure, take your Aspirin  81 mg and any morning medicines NOT listed above.  You may use sips of water .  5. Plan to go home the same day, you will only stay overnight if medically necessary. 6. Bring a current list of your medications and current insurance cards. 7. You MUST have a responsible person to drive you home. 8. Someone MUST be with you the first 24 hours after you arrive home or your discharge will be delayed. 9. Please wear clothes that are easy to get on and off and wear slip-on shoes.  If you have any questions or concerns before your next appointment please send us  a message through [place] or call our office at [phone].    TO LEAVE A MESSAGE FOR THE NURSE SELECT OPTION 2, PLEASE LEAVE A MESSAGE INCLUDING: YOUR NAME DATE OF BIRTH CALL  BACK NUMBER REASON FOR CALL**this is important as we prioritize the call backs  YOU WILL RECEIVE A CALL BACK THE SAME DAY AS LONG AS YOU CALL BEFORE 4:00 PM  At the [facility], you and your health needs are our priority. As part of our continuing mission to provide you with exceptional heart care, we have created designated Provider Care Teams. These Care Teams include your primary Cardiologist (physician) and Advanced Practice Providers (APPs- Physician Assistants and Nurse Practitioners) who all work together to provide you with the care you need, when you need it.   You may see any of the following providers on your designated Care Team at your next follow up: Dr Toribio Fuel Dr Ezra Shuck Dr. Ria Commander Dr. Morene Brownie Amy Lenetta, NP Caffie Shed, [place] [facility], [place] Beckey Coe, NP [place] Lee, NP Ellouise Class, NP Tinnie Redman, PharmD Jaun Bash, PharmD   Please be sure to bring in all your medications bottles to every appointment.    Thank you for choosing [facility]

## 2023-09-17 LAB — ANA W/REFLEX: Anti Nuclear Antibody (ANA): NEGATIVE

## 2023-09-17 LAB — HEPATITIS B E ANTIBODY: Hep B E Ab: NONREACTIVE

## 2023-09-17 LAB — ANTI-SCLERODERMA ANTIBODY: Scleroderma (Scl-70) (ENA) Antibody, IgG: <0.2 AI (ref 0.0–0.9)

## 2023-09-17 LAB — CENTROMERE ANTIBODIES: Centromere Ab Screen: <0.2 AI (ref 0.0–0.9)

## 2023-09-17 LAB — ANCA TITERS
Atypical P-ANCA titer: <1:20 {titer}
C-ANCA: <1:20 {titer}
P-ANCA: <1:20 {titer}

## 2023-09-17 LAB — RHEUMATOID FACTOR: Rheumatoid fact SerPl-aCnc: 11.6 [IU]/mL (ref <14.0)

## 2023-09-18 DIAGNOSIS — M4712 Other spondylosis with myelopathy, cervical region: Secondary | ICD-10-CM | POA: Diagnosis not present

## 2023-09-18 DIAGNOSIS — E873 Alkalosis: Secondary | ICD-10-CM | POA: Diagnosis not present

## 2023-09-18 DIAGNOSIS — I5032 Chronic diastolic (congestive) heart failure: Secondary | ICD-10-CM | POA: Diagnosis not present

## 2023-09-18 DIAGNOSIS — R918 Other nonspecific abnormal finding of lung field: Secondary | ICD-10-CM | POA: Diagnosis not present

## 2023-09-18 DIAGNOSIS — Z981 Arthrodesis status: Secondary | ICD-10-CM | POA: Diagnosis not present

## 2023-09-18 DIAGNOSIS — D509 Iron deficiency anemia, unspecified: Secondary | ICD-10-CM | POA: Diagnosis not present

## 2023-09-18 DIAGNOSIS — E871 Hypo-osmolality and hyponatremia: Secondary | ICD-10-CM | POA: Diagnosis not present

## 2023-09-18 DIAGNOSIS — J9621 Acute and chronic respiratory failure with hypoxia: Secondary | ICD-10-CM | POA: Diagnosis not present

## 2023-09-18 DIAGNOSIS — I2723 Pulmonary hypertension due to lung diseases and hypoxia: Secondary | ICD-10-CM | POA: Diagnosis not present

## 2023-09-18 DIAGNOSIS — E785 Hyperlipidemia, unspecified: Secondary | ICD-10-CM | POA: Diagnosis not present

## 2023-09-18 DIAGNOSIS — Z860100 Personal history of colon polyps, unspecified: Secondary | ICD-10-CM | POA: Diagnosis not present

## 2023-09-18 DIAGNOSIS — N181 Chronic kidney disease, stage 1: Secondary | ICD-10-CM | POA: Diagnosis not present

## 2023-09-18 DIAGNOSIS — Z7982 Long term (current) use of aspirin: Secondary | ICD-10-CM | POA: Diagnosis not present

## 2023-09-18 DIAGNOSIS — I110 Hypertensive heart disease with heart failure: Secondary | ICD-10-CM | POA: Diagnosis not present

## 2023-09-18 DIAGNOSIS — J4489 Other specified chronic obstructive pulmonary disease: Secondary | ICD-10-CM | POA: Diagnosis not present

## 2023-09-18 DIAGNOSIS — E1122 Type 2 diabetes mellitus with diabetic chronic kidney disease: Secondary | ICD-10-CM | POA: Diagnosis not present

## 2023-09-18 DIAGNOSIS — Z9089 Acquired absence of other organs: Secondary | ICD-10-CM | POA: Diagnosis not present

## 2023-09-18 DIAGNOSIS — F17210 Nicotine dependence, cigarettes, uncomplicated: Secondary | ICD-10-CM | POA: Diagnosis not present

## 2023-09-18 DIAGNOSIS — Z9981 Dependence on supplemental oxygen: Secondary | ICD-10-CM | POA: Diagnosis not present

## 2023-09-18 DIAGNOSIS — K219 Gastro-esophageal reflux disease without esophagitis: Secondary | ICD-10-CM | POA: Diagnosis not present

## 2023-09-18 DIAGNOSIS — Z96653 Presence of artificial knee joint, bilateral: Secondary | ICD-10-CM | POA: Diagnosis not present

## 2023-09-18 LAB — CYCLIC CITRUL PEPTIDE ANTIBODY, IGG/IGA: CCP Antibodies IgG/IgA: 123 U — ABNORMAL HIGH (ref 0–19)

## 2023-09-20 DIAGNOSIS — K219 Gastro-esophageal reflux disease without esophagitis: Secondary | ICD-10-CM | POA: Diagnosis not present

## 2023-09-20 DIAGNOSIS — Z9089 Acquired absence of other organs: Secondary | ICD-10-CM | POA: Diagnosis not present

## 2023-09-20 DIAGNOSIS — J4489 Other specified chronic obstructive pulmonary disease: Secondary | ICD-10-CM | POA: Diagnosis not present

## 2023-09-20 DIAGNOSIS — E871 Hypo-osmolality and hyponatremia: Secondary | ICD-10-CM | POA: Diagnosis not present

## 2023-09-20 DIAGNOSIS — Z7982 Long term (current) use of aspirin: Secondary | ICD-10-CM | POA: Diagnosis not present

## 2023-09-20 DIAGNOSIS — J9621 Acute and chronic respiratory failure with hypoxia: Secondary | ICD-10-CM | POA: Diagnosis not present

## 2023-09-20 DIAGNOSIS — I2723 Pulmonary hypertension due to lung diseases and hypoxia: Secondary | ICD-10-CM | POA: Diagnosis not present

## 2023-09-20 DIAGNOSIS — Z9981 Dependence on supplemental oxygen: Secondary | ICD-10-CM | POA: Diagnosis not present

## 2023-09-20 DIAGNOSIS — I5032 Chronic diastolic (congestive) heart failure: Secondary | ICD-10-CM | POA: Diagnosis not present

## 2023-09-20 DIAGNOSIS — I110 Hypertensive heart disease with heart failure: Secondary | ICD-10-CM | POA: Diagnosis not present

## 2023-09-20 DIAGNOSIS — M4712 Other spondylosis with myelopathy, cervical region: Secondary | ICD-10-CM | POA: Diagnosis not present

## 2023-09-20 DIAGNOSIS — E873 Alkalosis: Secondary | ICD-10-CM | POA: Diagnosis not present

## 2023-09-20 DIAGNOSIS — Z860100 Personal history of colon polyps, unspecified: Secondary | ICD-10-CM | POA: Diagnosis not present

## 2023-09-20 DIAGNOSIS — E785 Hyperlipidemia, unspecified: Secondary | ICD-10-CM | POA: Diagnosis not present

## 2023-09-20 DIAGNOSIS — F17210 Nicotine dependence, cigarettes, uncomplicated: Secondary | ICD-10-CM | POA: Diagnosis not present

## 2023-09-20 DIAGNOSIS — R918 Other nonspecific abnormal finding of lung field: Secondary | ICD-10-CM | POA: Diagnosis not present

## 2023-09-20 DIAGNOSIS — E1122 Type 2 diabetes mellitus with diabetic chronic kidney disease: Secondary | ICD-10-CM | POA: Diagnosis not present

## 2023-09-20 DIAGNOSIS — D509 Iron deficiency anemia, unspecified: Secondary | ICD-10-CM | POA: Diagnosis not present

## 2023-09-20 DIAGNOSIS — N181 Chronic kidney disease, stage 1: Secondary | ICD-10-CM | POA: Diagnosis not present

## 2023-09-20 DIAGNOSIS — Z981 Arthrodesis status: Secondary | ICD-10-CM | POA: Diagnosis not present

## 2023-09-20 DIAGNOSIS — Z96653 Presence of artificial knee joint, bilateral: Secondary | ICD-10-CM | POA: Diagnosis not present

## 2023-09-23 DIAGNOSIS — E873 Alkalosis: Secondary | ICD-10-CM | POA: Diagnosis not present

## 2023-09-23 DIAGNOSIS — J4489 Other specified chronic obstructive pulmonary disease: Secondary | ICD-10-CM | POA: Diagnosis not present

## 2023-09-23 DIAGNOSIS — Z860100 Personal history of colon polyps, unspecified: Secondary | ICD-10-CM | POA: Diagnosis not present

## 2023-09-23 DIAGNOSIS — Z9089 Acquired absence of other organs: Secondary | ICD-10-CM | POA: Diagnosis not present

## 2023-09-23 DIAGNOSIS — I110 Hypertensive heart disease with heart failure: Secondary | ICD-10-CM | POA: Diagnosis not present

## 2023-09-23 DIAGNOSIS — I2723 Pulmonary hypertension due to lung diseases and hypoxia: Secondary | ICD-10-CM | POA: Diagnosis not present

## 2023-09-23 DIAGNOSIS — Z981 Arthrodesis status: Secondary | ICD-10-CM | POA: Diagnosis not present

## 2023-09-23 DIAGNOSIS — E871 Hypo-osmolality and hyponatremia: Secondary | ICD-10-CM | POA: Diagnosis not present

## 2023-09-23 DIAGNOSIS — D509 Iron deficiency anemia, unspecified: Secondary | ICD-10-CM | POA: Diagnosis not present

## 2023-09-23 DIAGNOSIS — E1122 Type 2 diabetes mellitus with diabetic chronic kidney disease: Secondary | ICD-10-CM | POA: Diagnosis not present

## 2023-09-23 DIAGNOSIS — Z96653 Presence of artificial knee joint, bilateral: Secondary | ICD-10-CM | POA: Diagnosis not present

## 2023-09-23 DIAGNOSIS — M4712 Other spondylosis with myelopathy, cervical region: Secondary | ICD-10-CM | POA: Diagnosis not present

## 2023-09-23 DIAGNOSIS — J9621 Acute and chronic respiratory failure with hypoxia: Secondary | ICD-10-CM | POA: Diagnosis not present

## 2023-09-23 DIAGNOSIS — I5032 Chronic diastolic (congestive) heart failure: Secondary | ICD-10-CM | POA: Diagnosis not present

## 2023-09-23 DIAGNOSIS — E785 Hyperlipidemia, unspecified: Secondary | ICD-10-CM | POA: Diagnosis not present

## 2023-09-23 DIAGNOSIS — R918 Other nonspecific abnormal finding of lung field: Secondary | ICD-10-CM | POA: Diagnosis not present

## 2023-09-23 DIAGNOSIS — F17210 Nicotine dependence, cigarettes, uncomplicated: Secondary | ICD-10-CM | POA: Diagnosis not present

## 2023-09-23 DIAGNOSIS — K219 Gastro-esophageal reflux disease without esophagitis: Secondary | ICD-10-CM | POA: Diagnosis not present

## 2023-09-23 DIAGNOSIS — Z7982 Long term (current) use of aspirin: Secondary | ICD-10-CM | POA: Diagnosis not present

## 2023-09-23 DIAGNOSIS — N181 Chronic kidney disease, stage 1: Secondary | ICD-10-CM | POA: Diagnosis not present

## 2023-09-23 DIAGNOSIS — Z9981 Dependence on supplemental oxygen: Secondary | ICD-10-CM | POA: Diagnosis not present

## 2023-09-24 DIAGNOSIS — I2723 Pulmonary hypertension due to lung diseases and hypoxia: Secondary | ICD-10-CM | POA: Diagnosis not present

## 2023-09-24 DIAGNOSIS — N181 Chronic kidney disease, stage 1: Secondary | ICD-10-CM | POA: Diagnosis not present

## 2023-09-24 DIAGNOSIS — D509 Iron deficiency anemia, unspecified: Secondary | ICD-10-CM | POA: Diagnosis not present

## 2023-09-24 DIAGNOSIS — R918 Other nonspecific abnormal finding of lung field: Secondary | ICD-10-CM | POA: Diagnosis not present

## 2023-09-24 DIAGNOSIS — M4712 Other spondylosis with myelopathy, cervical region: Secondary | ICD-10-CM | POA: Diagnosis not present

## 2023-09-24 DIAGNOSIS — J4489 Other specified chronic obstructive pulmonary disease: Secondary | ICD-10-CM | POA: Diagnosis not present

## 2023-09-24 DIAGNOSIS — E871 Hypo-osmolality and hyponatremia: Secondary | ICD-10-CM | POA: Diagnosis not present

## 2023-09-24 DIAGNOSIS — E785 Hyperlipidemia, unspecified: Secondary | ICD-10-CM | POA: Diagnosis not present

## 2023-09-24 DIAGNOSIS — I110 Hypertensive heart disease with heart failure: Secondary | ICD-10-CM | POA: Diagnosis not present

## 2023-09-24 DIAGNOSIS — J9621 Acute and chronic respiratory failure with hypoxia: Secondary | ICD-10-CM | POA: Diagnosis not present

## 2023-09-24 DIAGNOSIS — Z860100 Personal history of colon polyps, unspecified: Secondary | ICD-10-CM | POA: Diagnosis not present

## 2023-09-24 DIAGNOSIS — E1122 Type 2 diabetes mellitus with diabetic chronic kidney disease: Secondary | ICD-10-CM | POA: Diagnosis not present

## 2023-09-24 DIAGNOSIS — Z981 Arthrodesis status: Secondary | ICD-10-CM | POA: Diagnosis not present

## 2023-09-24 DIAGNOSIS — K219 Gastro-esophageal reflux disease without esophagitis: Secondary | ICD-10-CM | POA: Diagnosis not present

## 2023-09-24 DIAGNOSIS — Z9981 Dependence on supplemental oxygen: Secondary | ICD-10-CM | POA: Diagnosis not present

## 2023-09-24 DIAGNOSIS — I5032 Chronic diastolic (congestive) heart failure: Secondary | ICD-10-CM | POA: Diagnosis not present

## 2023-09-24 DIAGNOSIS — Z96653 Presence of artificial knee joint, bilateral: Secondary | ICD-10-CM | POA: Diagnosis not present

## 2023-09-24 DIAGNOSIS — E873 Alkalosis: Secondary | ICD-10-CM | POA: Diagnosis not present

## 2023-09-24 DIAGNOSIS — F17210 Nicotine dependence, cigarettes, uncomplicated: Secondary | ICD-10-CM | POA: Diagnosis not present

## 2023-09-24 DIAGNOSIS — Z7982 Long term (current) use of aspirin: Secondary | ICD-10-CM | POA: Diagnosis not present

## 2023-09-24 DIAGNOSIS — Z9089 Acquired absence of other organs: Secondary | ICD-10-CM | POA: Diagnosis not present

## 2023-09-26 ENCOUNTER — Other Ambulatory Visit: Payer: Self-pay

## 2023-09-26 ENCOUNTER — Ambulatory Visit (HOSPITAL_COMMUNITY)
Admission: RE | Admit: 2023-09-26 | Discharge: 2023-09-26 | Disposition: A | Discharge status: Home / Self Care | Attending: Internal Medicine | Admitting: Internal Medicine

## 2023-09-26 ENCOUNTER — Encounter (HOSPITAL_COMMUNITY): Payer: Self-pay | Admitting: Internal Medicine

## 2023-09-26 ENCOUNTER — Encounter (HOSPITAL_COMMUNITY)
Admission: RE | Disposition: A | Discharge status: Home / Self Care | Payer: Self-pay | Source: Home / Self Care | Attending: Internal Medicine

## 2023-09-26 DIAGNOSIS — I509 Heart failure, unspecified: Secondary | ICD-10-CM

## 2023-09-26 DIAGNOSIS — K766 Portal hypertension: Secondary | ICD-10-CM | POA: Insufficient documentation

## 2023-09-26 DIAGNOSIS — I110 Hypertensive heart disease with heart failure: Secondary | ICD-10-CM | POA: Diagnosis not present

## 2023-09-26 DIAGNOSIS — E785 Hyperlipidemia, unspecified: Secondary | ICD-10-CM | POA: Diagnosis not present

## 2023-09-26 DIAGNOSIS — I2721 Secondary pulmonary arterial hypertension: Secondary | ICD-10-CM | POA: Diagnosis not present

## 2023-09-26 DIAGNOSIS — Z9981 Dependence on supplemental oxygen: Secondary | ICD-10-CM | POA: Diagnosis not present

## 2023-09-26 DIAGNOSIS — Z79899 Other long term (current) drug therapy: Secondary | ICD-10-CM | POA: Insufficient documentation

## 2023-09-26 DIAGNOSIS — J4489 Other specified chronic obstructive pulmonary disease: Secondary | ICD-10-CM | POA: Insufficient documentation

## 2023-09-26 DIAGNOSIS — F17210 Nicotine dependence, cigarettes, uncomplicated: Secondary | ICD-10-CM | POA: Diagnosis not present

## 2023-09-26 DIAGNOSIS — I5032 Chronic diastolic (congestive) heart failure: Secondary | ICD-10-CM | POA: Diagnosis not present

## 2023-09-26 DIAGNOSIS — I272 Pulmonary hypertension, unspecified: Secondary | ICD-10-CM

## 2023-09-26 HISTORY — PX: RIGHT HEART CATH: CATH118263

## 2023-09-26 LAB — GLUCOSE, CAPILLARY: Glucose-Capillary: 112 mg/dL — ABNORMAL HIGH (ref 70–99)

## 2023-09-26 LAB — POCT I-STAT EG7
Acid-Base Excess: 6 mmol/L — ABNORMAL HIGH (ref 0.0–2.0)
Acid-Base Excess: 7 mmol/L — ABNORMAL HIGH (ref 0.0–2.0)
Bicarbonate: 31.6 mmol/L — ABNORMAL HIGH (ref 20.0–28.0)
Bicarbonate: 33 mmol/L — ABNORMAL HIGH (ref 20.0–28.0)
Calcium, Ion: 1.21 mmol/L (ref 1.15–1.40)
Calcium, Ion: 1.21 mmol/L (ref 1.15–1.40)
HCT: 35 % — ABNORMAL LOW (ref 36.0–46.0)
HCT: 37 % (ref 36.0–46.0)
Hemoglobin: 11.9 g/dL — ABNORMAL LOW (ref 12.0–15.0)
Hemoglobin: 12.6 g/dL (ref 12.0–15.0)
O2 Saturation: 66 %
O2 Saturation: 65 %
Potassium: 3.7 mmol/L (ref 3.5–5.1)
Potassium: 4 mmol/L (ref 3.5–5.1)
Sodium: 134 mmol/L — ABNORMAL LOW (ref 135–145)
Sodium: 131 mmol/L — ABNORMAL LOW (ref 135–145)
TCO2: 33 mmol/L — ABNORMAL HIGH (ref 22–32)
TCO2: 35 mmol/L — ABNORMAL HIGH (ref 22–32)
pCO2, Ven: 50 mmHg (ref 44–60)
pCO2, Ven: 54 mmHg (ref 44–60)
pH, Ven: 7.409 (ref 7.25–7.43)
pH, Ven: 7.393 (ref 7.25–7.43)
pO2, Ven: 35 mmHg (ref 32–45)
pO2, Ven: 35 mmHg (ref 32–45)

## 2023-09-26 LAB — CBC
HCT: 40.1 % (ref 36.0–46.0)
Hemoglobin: 11.8 g/dL — ABNORMAL LOW (ref 12.0–15.0)
MCH: 24.8 pg — ABNORMAL LOW (ref 26.0–34.0)
MCHC: 29.4 g/dL — ABNORMAL LOW (ref 30.0–36.0)
MCV: 84.2 fL (ref 80.0–100.0)
Platelets: 383 K/uL (ref 150–400)
RBC: 4.76 MIL/uL (ref 3.87–5.11)
RDW: 26.3 % — ABNORMAL HIGH (ref 11.5–15.5)
WBC: 7.6 K/uL (ref 4.0–10.5)
nRBC: 0 % (ref 0.0–0.2)

## 2023-09-26 SURGERY — RIGHT HEART CATH
Anesthesia: LOCAL

## 2023-09-26 MED ORDER — LIDOCAINE HCL (PF) 1 % IJ SOLN
INTRAMUSCULAR | Status: DC | PRN
  Administered 2023-09-26: 2 mL via INTRADERMAL

## 2023-09-26 MED ORDER — SODIUM CHLORIDE 0.9 % IV SOLN: INTRAVENOUS | Status: DC

## 2023-09-26 MED ORDER — SODIUM CHLORIDE 0.9 % IV SOLN
250.0000 mL | INTRAVENOUS | Status: DC | PRN
Start: 2023-09-26 — End: 2023-09-26

## 2023-09-26 MED ORDER — LABETALOL HCL 5 MG/ML IV SOLN: 10.0000 mg | INTRAVENOUS | Status: DC | PRN

## 2023-09-26 MED ORDER — HYDRALAZINE HCL 20 MG/ML IJ SOLN: 10.0000 mg | INTRAMUSCULAR | Status: DC | PRN

## 2023-09-26 MED ORDER — LIDOCAINE HCL (PF) 1 % IJ SOLN
INTRAMUSCULAR | Status: AC
  Filled 2023-09-26: qty 30

## 2023-09-26 MED ORDER — HEPARIN (PORCINE) IN NACL 1000-0.9 UT/500ML-% IV SOLN
INTRAVENOUS | Status: DC | PRN
Start: 2023-09-26 — End: 2023-09-26
  Administered 2023-09-26: 500 mL

## 2023-09-26 MED ORDER — SODIUM CHLORIDE 0.9% FLUSH: 3.0000 mL | Freq: Two times a day (BID) | INTRAVENOUS | Status: DC

## 2023-09-26 MED ORDER — ACETAMINOPHEN 325 MG PO TABS: 650.0000 mg | ORAL_TABLET | ORAL | Status: DC | PRN

## 2023-09-26 MED ORDER — SODIUM CHLORIDE 0.9% FLUSH
3.0000 mL | INTRAVENOUS | Status: DC | PRN
Start: 2023-09-26 — End: 2023-09-26

## 2023-09-26 MED ORDER — ONDANSETRON HCL 4 MG/2ML IJ SOLN: 4.0000 mg | Freq: Four times a day (QID) | INTRAMUSCULAR | Status: DC | PRN

## 2023-09-26 SURGICAL SUPPLY — 8 items
CATH SWAN GANZ 7F STRAIGHT (CATHETERS) IMPLANT
GLIDESHEATH SLENDER 7FR .021G (SHEATH) IMPLANT
GUIDEWIRE .025 260CM (WIRE) IMPLANT
KIT MICROPUNCTURE NIT STIFF (SHEATH) IMPLANT
PACK CARDIAC CATHETERIZATION (CUSTOM PROCEDURE TRAY) ×1 IMPLANT
SHEATH PROBE COVER 6X72 (BAG) IMPLANT
TRANSDUCER W/STOPCOCK (MISCELLANEOUS) IMPLANT
TUBING ART PRESS 72 MALE/FEM (TUBING) IMPLANT

## 2023-09-26 NOTE — Discharge Instructions (Signed)

## 2023-09-26 NOTE — Interval H&P Note (Signed)
 History and Physical Interval Note:  09/26/2023 1:09 PM  Debbie Mullins  has presented today for surgery, with the diagnosis of hp.  The various methods of treatment have been discussed with the patient and family. After consideration of risks, benefits and other options for treatment, the patient has consented to  Procedure(s): RIGHT HEART CATH (N/A) as a surgical intervention.  The patient's history has been reviewed, patient examined, no change in status, stable for surgery.  I have reviewed the patient's chart and labs.  Questions were answered to the patient's satisfaction.     Jacki Couse

## 2023-09-27 LAB — GLUCOSE, CAPILLARY: Glucose-Capillary: 75 mg/dL (ref 70–99)

## 2023-09-30 DIAGNOSIS — Z7982 Long term (current) use of aspirin: Secondary | ICD-10-CM | POA: Diagnosis not present

## 2023-09-30 DIAGNOSIS — I110 Hypertensive heart disease with heart failure: Secondary | ICD-10-CM | POA: Diagnosis not present

## 2023-09-30 DIAGNOSIS — Z96653 Presence of artificial knee joint, bilateral: Secondary | ICD-10-CM | POA: Diagnosis not present

## 2023-09-30 DIAGNOSIS — Z981 Arthrodesis status: Secondary | ICD-10-CM | POA: Diagnosis not present

## 2023-09-30 DIAGNOSIS — R918 Other nonspecific abnormal finding of lung field: Secondary | ICD-10-CM | POA: Diagnosis not present

## 2023-09-30 DIAGNOSIS — I5032 Chronic diastolic (congestive) heart failure: Secondary | ICD-10-CM | POA: Diagnosis not present

## 2023-09-30 DIAGNOSIS — E1122 Type 2 diabetes mellitus with diabetic chronic kidney disease: Secondary | ICD-10-CM | POA: Diagnosis not present

## 2023-09-30 DIAGNOSIS — K219 Gastro-esophageal reflux disease without esophagitis: Secondary | ICD-10-CM | POA: Diagnosis not present

## 2023-09-30 DIAGNOSIS — E785 Hyperlipidemia, unspecified: Secondary | ICD-10-CM | POA: Diagnosis not present

## 2023-09-30 DIAGNOSIS — E871 Hypo-osmolality and hyponatremia: Secondary | ICD-10-CM | POA: Diagnosis not present

## 2023-09-30 DIAGNOSIS — E873 Alkalosis: Secondary | ICD-10-CM | POA: Diagnosis not present

## 2023-09-30 DIAGNOSIS — I2723 Pulmonary hypertension due to lung diseases and hypoxia: Secondary | ICD-10-CM | POA: Diagnosis not present

## 2023-09-30 DIAGNOSIS — F17210 Nicotine dependence, cigarettes, uncomplicated: Secondary | ICD-10-CM | POA: Diagnosis not present

## 2023-09-30 DIAGNOSIS — M4712 Other spondylosis with myelopathy, cervical region: Secondary | ICD-10-CM | POA: Diagnosis not present

## 2023-09-30 DIAGNOSIS — Z9981 Dependence on supplemental oxygen: Secondary | ICD-10-CM | POA: Diagnosis not present

## 2023-09-30 DIAGNOSIS — J4489 Other specified chronic obstructive pulmonary disease: Secondary | ICD-10-CM | POA: Diagnosis not present

## 2023-09-30 DIAGNOSIS — Z860100 Personal history of colon polyps, unspecified: Secondary | ICD-10-CM | POA: Diagnosis not present

## 2023-09-30 DIAGNOSIS — N181 Chronic kidney disease, stage 1: Secondary | ICD-10-CM | POA: Diagnosis not present

## 2023-09-30 DIAGNOSIS — Z9089 Acquired absence of other organs: Secondary | ICD-10-CM | POA: Diagnosis not present

## 2023-09-30 DIAGNOSIS — D509 Iron deficiency anemia, unspecified: Secondary | ICD-10-CM | POA: Diagnosis not present

## 2023-09-30 DIAGNOSIS — J9621 Acute and chronic respiratory failure with hypoxia: Secondary | ICD-10-CM | POA: Diagnosis not present

## 2023-10-01 DIAGNOSIS — Z9089 Acquired absence of other organs: Secondary | ICD-10-CM | POA: Diagnosis not present

## 2023-10-01 DIAGNOSIS — E785 Hyperlipidemia, unspecified: Secondary | ICD-10-CM | POA: Diagnosis not present

## 2023-10-01 DIAGNOSIS — Z9981 Dependence on supplemental oxygen: Secondary | ICD-10-CM | POA: Diagnosis not present

## 2023-10-01 DIAGNOSIS — J9621 Acute and chronic respiratory failure with hypoxia: Secondary | ICD-10-CM | POA: Diagnosis not present

## 2023-10-01 DIAGNOSIS — Z96653 Presence of artificial knee joint, bilateral: Secondary | ICD-10-CM | POA: Diagnosis not present

## 2023-10-01 DIAGNOSIS — E871 Hypo-osmolality and hyponatremia: Secondary | ICD-10-CM | POA: Diagnosis not present

## 2023-10-01 DIAGNOSIS — Z981 Arthrodesis status: Secondary | ICD-10-CM | POA: Diagnosis not present

## 2023-10-01 DIAGNOSIS — E873 Alkalosis: Secondary | ICD-10-CM | POA: Diagnosis not present

## 2023-10-01 DIAGNOSIS — E1122 Type 2 diabetes mellitus with diabetic chronic kidney disease: Secondary | ICD-10-CM | POA: Diagnosis not present

## 2023-10-01 DIAGNOSIS — I2723 Pulmonary hypertension due to lung diseases and hypoxia: Secondary | ICD-10-CM | POA: Diagnosis not present

## 2023-10-01 DIAGNOSIS — D509 Iron deficiency anemia, unspecified: Secondary | ICD-10-CM | POA: Diagnosis not present

## 2023-10-01 DIAGNOSIS — R918 Other nonspecific abnormal finding of lung field: Secondary | ICD-10-CM | POA: Diagnosis not present

## 2023-10-01 DIAGNOSIS — I5032 Chronic diastolic (congestive) heart failure: Secondary | ICD-10-CM | POA: Diagnosis not present

## 2023-10-01 DIAGNOSIS — I110 Hypertensive heart disease with heart failure: Secondary | ICD-10-CM | POA: Diagnosis not present

## 2023-10-01 DIAGNOSIS — Z7982 Long term (current) use of aspirin: Secondary | ICD-10-CM | POA: Diagnosis not present

## 2023-10-01 DIAGNOSIS — Z860100 Personal history of colon polyps, unspecified: Secondary | ICD-10-CM | POA: Diagnosis not present

## 2023-10-01 DIAGNOSIS — F17210 Nicotine dependence, cigarettes, uncomplicated: Secondary | ICD-10-CM | POA: Diagnosis not present

## 2023-10-01 DIAGNOSIS — M4712 Other spondylosis with myelopathy, cervical region: Secondary | ICD-10-CM | POA: Diagnosis not present

## 2023-10-01 DIAGNOSIS — K219 Gastro-esophageal reflux disease without esophagitis: Secondary | ICD-10-CM | POA: Diagnosis not present

## 2023-10-01 DIAGNOSIS — J4489 Other specified chronic obstructive pulmonary disease: Secondary | ICD-10-CM | POA: Diagnosis not present

## 2023-10-01 DIAGNOSIS — N181 Chronic kidney disease, stage 1: Secondary | ICD-10-CM | POA: Diagnosis not present

## 2023-10-06 DIAGNOSIS — J9610 Chronic respiratory failure, unspecified whether with hypoxia or hypercapnia: Secondary | ICD-10-CM | POA: Diagnosis not present

## 2023-10-07 ENCOUNTER — Ambulatory Visit (HOSPITAL_COMMUNITY)
Admission: RE | Admit: 2023-10-07 | Discharge: 2023-10-07 | Disposition: A | Discharge status: Home / Self Care | Source: Ambulatory Visit | Attending: Family Medicine | Admitting: Family Medicine

## 2023-10-07 DIAGNOSIS — M4712 Other spondylosis with myelopathy, cervical region: Secondary | ICD-10-CM | POA: Diagnosis not present

## 2023-10-07 DIAGNOSIS — Z981 Arthrodesis status: Secondary | ICD-10-CM | POA: Diagnosis not present

## 2023-10-07 DIAGNOSIS — Z87891 Personal history of nicotine dependence: Secondary | ICD-10-CM | POA: Insufficient documentation

## 2023-10-07 DIAGNOSIS — Z96653 Presence of artificial knee joint, bilateral: Secondary | ICD-10-CM | POA: Diagnosis not present

## 2023-10-07 DIAGNOSIS — I110 Hypertensive heart disease with heart failure: Secondary | ICD-10-CM | POA: Diagnosis not present

## 2023-10-07 DIAGNOSIS — E785 Hyperlipidemia, unspecified: Secondary | ICD-10-CM | POA: Diagnosis not present

## 2023-10-07 DIAGNOSIS — K219 Gastro-esophageal reflux disease without esophagitis: Secondary | ICD-10-CM | POA: Diagnosis not present

## 2023-10-07 DIAGNOSIS — Z860100 Personal history of colon polyps, unspecified: Secondary | ICD-10-CM | POA: Diagnosis not present

## 2023-10-07 DIAGNOSIS — I2723 Pulmonary hypertension due to lung diseases and hypoxia: Secondary | ICD-10-CM | POA: Diagnosis not present

## 2023-10-07 DIAGNOSIS — E871 Hypo-osmolality and hyponatremia: Secondary | ICD-10-CM | POA: Diagnosis not present

## 2023-10-07 DIAGNOSIS — I5032 Chronic diastolic (congestive) heart failure: Secondary | ICD-10-CM | POA: Diagnosis not present

## 2023-10-07 DIAGNOSIS — F17210 Nicotine dependence, cigarettes, uncomplicated: Secondary | ICD-10-CM | POA: Diagnosis not present

## 2023-10-07 DIAGNOSIS — E873 Alkalosis: Secondary | ICD-10-CM | POA: Diagnosis not present

## 2023-10-07 DIAGNOSIS — Z122 Encounter for screening for malignant neoplasm of respiratory organs: Secondary | ICD-10-CM | POA: Insufficient documentation

## 2023-10-07 DIAGNOSIS — J4489 Other specified chronic obstructive pulmonary disease: Secondary | ICD-10-CM | POA: Diagnosis not present

## 2023-10-07 DIAGNOSIS — Z9981 Dependence on supplemental oxygen: Secondary | ICD-10-CM | POA: Diagnosis not present

## 2023-10-07 DIAGNOSIS — N181 Chronic kidney disease, stage 1: Secondary | ICD-10-CM | POA: Diagnosis not present

## 2023-10-07 DIAGNOSIS — J9621 Acute and chronic respiratory failure with hypoxia: Secondary | ICD-10-CM | POA: Diagnosis not present

## 2023-10-07 DIAGNOSIS — Z7982 Long term (current) use of aspirin: Secondary | ICD-10-CM | POA: Diagnosis not present

## 2023-10-07 DIAGNOSIS — R918 Other nonspecific abnormal finding of lung field: Secondary | ICD-10-CM | POA: Diagnosis not present

## 2023-10-07 DIAGNOSIS — Z9089 Acquired absence of other organs: Secondary | ICD-10-CM | POA: Diagnosis not present

## 2023-10-07 DIAGNOSIS — E1122 Type 2 diabetes mellitus with diabetic chronic kidney disease: Secondary | ICD-10-CM | POA: Diagnosis not present

## 2023-10-07 DIAGNOSIS — D509 Iron deficiency anemia, unspecified: Secondary | ICD-10-CM | POA: Diagnosis not present

## 2023-10-13 DIAGNOSIS — E785 Hyperlipidemia, unspecified: Secondary | ICD-10-CM | POA: Diagnosis not present

## 2023-10-13 DIAGNOSIS — E1122 Type 2 diabetes mellitus with diabetic chronic kidney disease: Secondary | ICD-10-CM | POA: Diagnosis not present

## 2023-10-13 DIAGNOSIS — Z981 Arthrodesis status: Secondary | ICD-10-CM | POA: Diagnosis not present

## 2023-10-13 DIAGNOSIS — E871 Hypo-osmolality and hyponatremia: Secondary | ICD-10-CM | POA: Diagnosis not present

## 2023-10-13 DIAGNOSIS — F17210 Nicotine dependence, cigarettes, uncomplicated: Secondary | ICD-10-CM | POA: Diagnosis not present

## 2023-10-13 DIAGNOSIS — N181 Chronic kidney disease, stage 1: Secondary | ICD-10-CM | POA: Diagnosis not present

## 2023-10-13 DIAGNOSIS — Z9981 Dependence on supplemental oxygen: Secondary | ICD-10-CM | POA: Diagnosis not present

## 2023-10-13 DIAGNOSIS — Z96653 Presence of artificial knee joint, bilateral: Secondary | ICD-10-CM | POA: Diagnosis not present

## 2023-10-13 DIAGNOSIS — R918 Other nonspecific abnormal finding of lung field: Secondary | ICD-10-CM | POA: Diagnosis not present

## 2023-10-13 DIAGNOSIS — K219 Gastro-esophageal reflux disease without esophagitis: Secondary | ICD-10-CM | POA: Diagnosis not present

## 2023-10-13 DIAGNOSIS — Z9089 Acquired absence of other organs: Secondary | ICD-10-CM | POA: Diagnosis not present

## 2023-10-13 DIAGNOSIS — J9621 Acute and chronic respiratory failure with hypoxia: Secondary | ICD-10-CM | POA: Diagnosis not present

## 2023-10-13 DIAGNOSIS — I110 Hypertensive heart disease with heart failure: Secondary | ICD-10-CM | POA: Diagnosis not present

## 2023-10-13 DIAGNOSIS — E873 Alkalosis: Secondary | ICD-10-CM | POA: Diagnosis not present

## 2023-10-13 DIAGNOSIS — M4712 Other spondylosis with myelopathy, cervical region: Secondary | ICD-10-CM | POA: Diagnosis not present

## 2023-10-13 DIAGNOSIS — I5032 Chronic diastolic (congestive) heart failure: Secondary | ICD-10-CM | POA: Diagnosis not present

## 2023-10-13 DIAGNOSIS — J4489 Other specified chronic obstructive pulmonary disease: Secondary | ICD-10-CM | POA: Diagnosis not present

## 2023-10-13 DIAGNOSIS — Z860100 Personal history of colon polyps, unspecified: Secondary | ICD-10-CM | POA: Diagnosis not present

## 2023-10-13 DIAGNOSIS — I2723 Pulmonary hypertension due to lung diseases and hypoxia: Secondary | ICD-10-CM | POA: Diagnosis not present

## 2023-10-13 DIAGNOSIS — Z7982 Long term (current) use of aspirin: Secondary | ICD-10-CM | POA: Diagnosis not present

## 2023-10-13 DIAGNOSIS — D509 Iron deficiency anemia, unspecified: Secondary | ICD-10-CM | POA: Diagnosis not present

## 2023-10-14 ENCOUNTER — Other Ambulatory Visit: Payer: Self-pay | Admitting: Acute Care

## 2023-10-14 DIAGNOSIS — Z87891 Personal history of nicotine dependence: Secondary | ICD-10-CM

## 2023-10-14 DIAGNOSIS — Z122 Encounter for screening for malignant neoplasm of respiratory organs: Secondary | ICD-10-CM

## 2023-10-14 DIAGNOSIS — F17210 Nicotine dependence, cigarettes, uncomplicated: Secondary | ICD-10-CM

## 2023-10-15 DIAGNOSIS — I110 Hypertensive heart disease with heart failure: Secondary | ICD-10-CM | POA: Diagnosis not present

## 2023-10-15 DIAGNOSIS — J4489 Other specified chronic obstructive pulmonary disease: Secondary | ICD-10-CM | POA: Diagnosis not present

## 2023-10-15 DIAGNOSIS — R918 Other nonspecific abnormal finding of lung field: Secondary | ICD-10-CM | POA: Diagnosis not present

## 2023-10-15 DIAGNOSIS — Z981 Arthrodesis status: Secondary | ICD-10-CM | POA: Diagnosis not present

## 2023-10-15 DIAGNOSIS — Z9089 Acquired absence of other organs: Secondary | ICD-10-CM | POA: Diagnosis not present

## 2023-10-15 DIAGNOSIS — Z96653 Presence of artificial knee joint, bilateral: Secondary | ICD-10-CM | POA: Diagnosis not present

## 2023-10-15 DIAGNOSIS — Z860100 Personal history of colon polyps, unspecified: Secondary | ICD-10-CM | POA: Diagnosis not present

## 2023-10-15 DIAGNOSIS — I5032 Chronic diastolic (congestive) heart failure: Secondary | ICD-10-CM | POA: Diagnosis not present

## 2023-10-15 DIAGNOSIS — K219 Gastro-esophageal reflux disease without esophagitis: Secondary | ICD-10-CM | POA: Diagnosis not present

## 2023-10-15 DIAGNOSIS — Z7982 Long term (current) use of aspirin: Secondary | ICD-10-CM | POA: Diagnosis not present

## 2023-10-15 DIAGNOSIS — N181 Chronic kidney disease, stage 1: Secondary | ICD-10-CM | POA: Diagnosis not present

## 2023-10-15 DIAGNOSIS — D509 Iron deficiency anemia, unspecified: Secondary | ICD-10-CM | POA: Diagnosis not present

## 2023-10-15 DIAGNOSIS — E873 Alkalosis: Secondary | ICD-10-CM | POA: Diagnosis not present

## 2023-10-15 DIAGNOSIS — E871 Hypo-osmolality and hyponatremia: Secondary | ICD-10-CM | POA: Diagnosis not present

## 2023-10-15 DIAGNOSIS — E785 Hyperlipidemia, unspecified: Secondary | ICD-10-CM | POA: Diagnosis not present

## 2023-10-15 DIAGNOSIS — I2723 Pulmonary hypertension due to lung diseases and hypoxia: Secondary | ICD-10-CM | POA: Diagnosis not present

## 2023-10-15 DIAGNOSIS — M4712 Other spondylosis with myelopathy, cervical region: Secondary | ICD-10-CM | POA: Diagnosis not present

## 2023-10-15 DIAGNOSIS — F17210 Nicotine dependence, cigarettes, uncomplicated: Secondary | ICD-10-CM | POA: Diagnosis not present

## 2023-10-15 DIAGNOSIS — Z9981 Dependence on supplemental oxygen: Secondary | ICD-10-CM | POA: Diagnosis not present

## 2023-10-15 DIAGNOSIS — E1122 Type 2 diabetes mellitus with diabetic chronic kidney disease: Secondary | ICD-10-CM | POA: Diagnosis not present

## 2023-10-15 DIAGNOSIS — J9621 Acute and chronic respiratory failure with hypoxia: Secondary | ICD-10-CM | POA: Diagnosis not present

## 2023-10-22 DIAGNOSIS — E785 Hyperlipidemia, unspecified: Secondary | ICD-10-CM | POA: Diagnosis not present

## 2023-10-22 DIAGNOSIS — Z860100 Personal history of colon polyps, unspecified: Secondary | ICD-10-CM | POA: Diagnosis not present

## 2023-10-22 DIAGNOSIS — D509 Iron deficiency anemia, unspecified: Secondary | ICD-10-CM | POA: Diagnosis not present

## 2023-10-22 DIAGNOSIS — Z981 Arthrodesis status: Secondary | ICD-10-CM | POA: Diagnosis not present

## 2023-10-22 DIAGNOSIS — J9621 Acute and chronic respiratory failure with hypoxia: Secondary | ICD-10-CM | POA: Diagnosis not present

## 2023-10-22 DIAGNOSIS — I110 Hypertensive heart disease with heart failure: Secondary | ICD-10-CM | POA: Diagnosis not present

## 2023-10-22 DIAGNOSIS — J4489 Other specified chronic obstructive pulmonary disease: Secondary | ICD-10-CM | POA: Diagnosis not present

## 2023-10-22 DIAGNOSIS — F17210 Nicotine dependence, cigarettes, uncomplicated: Secondary | ICD-10-CM | POA: Diagnosis not present

## 2023-10-22 DIAGNOSIS — M4712 Other spondylosis with myelopathy, cervical region: Secondary | ICD-10-CM | POA: Diagnosis not present

## 2023-10-22 DIAGNOSIS — N181 Chronic kidney disease, stage 1: Secondary | ICD-10-CM | POA: Diagnosis not present

## 2023-10-22 DIAGNOSIS — E873 Alkalosis: Secondary | ICD-10-CM | POA: Diagnosis not present

## 2023-10-22 DIAGNOSIS — K219 Gastro-esophageal reflux disease without esophagitis: Secondary | ICD-10-CM | POA: Diagnosis not present

## 2023-10-22 DIAGNOSIS — I2723 Pulmonary hypertension due to lung diseases and hypoxia: Secondary | ICD-10-CM | POA: Diagnosis not present

## 2023-10-22 DIAGNOSIS — E1122 Type 2 diabetes mellitus with diabetic chronic kidney disease: Secondary | ICD-10-CM | POA: Diagnosis not present

## 2023-10-22 DIAGNOSIS — E871 Hypo-osmolality and hyponatremia: Secondary | ICD-10-CM | POA: Diagnosis not present

## 2023-10-22 DIAGNOSIS — R918 Other nonspecific abnormal finding of lung field: Secondary | ICD-10-CM | POA: Diagnosis not present

## 2023-10-22 DIAGNOSIS — Z96653 Presence of artificial knee joint, bilateral: Secondary | ICD-10-CM | POA: Diagnosis not present

## 2023-10-22 DIAGNOSIS — Z9981 Dependence on supplemental oxygen: Secondary | ICD-10-CM | POA: Diagnosis not present

## 2023-10-22 DIAGNOSIS — I5032 Chronic diastolic (congestive) heart failure: Secondary | ICD-10-CM | POA: Diagnosis not present

## 2023-10-22 DIAGNOSIS — Z7982 Long term (current) use of aspirin: Secondary | ICD-10-CM | POA: Diagnosis not present

## 2023-10-22 DIAGNOSIS — Z9089 Acquired absence of other organs: Secondary | ICD-10-CM | POA: Diagnosis not present

## 2023-10-29 DIAGNOSIS — K219 Gastro-esophageal reflux disease without esophagitis: Secondary | ICD-10-CM | POA: Diagnosis not present

## 2023-10-29 DIAGNOSIS — R634 Abnormal weight loss: Secondary | ICD-10-CM | POA: Diagnosis not present

## 2023-10-29 DIAGNOSIS — J449 Chronic obstructive pulmonary disease, unspecified: Secondary | ICD-10-CM | POA: Diagnosis not present

## 2023-10-29 DIAGNOSIS — E1169 Type 2 diabetes mellitus with other specified complication: Secondary | ICD-10-CM | POA: Diagnosis not present

## 2023-10-29 DIAGNOSIS — I10 Essential (primary) hypertension: Secondary | ICD-10-CM | POA: Diagnosis not present

## 2023-10-31 DIAGNOSIS — Z96653 Presence of artificial knee joint, bilateral: Secondary | ICD-10-CM | POA: Diagnosis not present

## 2023-10-31 DIAGNOSIS — E871 Hypo-osmolality and hyponatremia: Secondary | ICD-10-CM | POA: Diagnosis not present

## 2023-10-31 DIAGNOSIS — D509 Iron deficiency anemia, unspecified: Secondary | ICD-10-CM | POA: Diagnosis not present

## 2023-10-31 DIAGNOSIS — K219 Gastro-esophageal reflux disease without esophagitis: Secondary | ICD-10-CM | POA: Diagnosis not present

## 2023-10-31 DIAGNOSIS — Z7982 Long term (current) use of aspirin: Secondary | ICD-10-CM | POA: Diagnosis not present

## 2023-10-31 DIAGNOSIS — R918 Other nonspecific abnormal finding of lung field: Secondary | ICD-10-CM | POA: Diagnosis not present

## 2023-10-31 DIAGNOSIS — Z9981 Dependence on supplemental oxygen: Secondary | ICD-10-CM | POA: Diagnosis not present

## 2023-10-31 DIAGNOSIS — I5032 Chronic diastolic (congestive) heart failure: Secondary | ICD-10-CM | POA: Diagnosis not present

## 2023-10-31 DIAGNOSIS — Z9089 Acquired absence of other organs: Secondary | ICD-10-CM | POA: Diagnosis not present

## 2023-10-31 DIAGNOSIS — E1122 Type 2 diabetes mellitus with diabetic chronic kidney disease: Secondary | ICD-10-CM | POA: Diagnosis not present

## 2023-10-31 DIAGNOSIS — F17210 Nicotine dependence, cigarettes, uncomplicated: Secondary | ICD-10-CM | POA: Diagnosis not present

## 2023-10-31 DIAGNOSIS — E785 Hyperlipidemia, unspecified: Secondary | ICD-10-CM | POA: Diagnosis not present

## 2023-10-31 DIAGNOSIS — J9621 Acute and chronic respiratory failure with hypoxia: Secondary | ICD-10-CM | POA: Diagnosis not present

## 2023-10-31 DIAGNOSIS — E873 Alkalosis: Secondary | ICD-10-CM | POA: Diagnosis not present

## 2023-10-31 DIAGNOSIS — Z860100 Personal history of colon polyps, unspecified: Secondary | ICD-10-CM | POA: Diagnosis not present

## 2023-10-31 DIAGNOSIS — I110 Hypertensive heart disease with heart failure: Secondary | ICD-10-CM | POA: Diagnosis not present

## 2023-10-31 DIAGNOSIS — J4489 Other specified chronic obstructive pulmonary disease: Secondary | ICD-10-CM | POA: Diagnosis not present

## 2023-10-31 DIAGNOSIS — M4712 Other spondylosis with myelopathy, cervical region: Secondary | ICD-10-CM | POA: Diagnosis not present

## 2023-10-31 DIAGNOSIS — Z981 Arthrodesis status: Secondary | ICD-10-CM | POA: Diagnosis not present

## 2023-10-31 DIAGNOSIS — I2723 Pulmonary hypertension due to lung diseases and hypoxia: Secondary | ICD-10-CM | POA: Diagnosis not present

## 2023-10-31 DIAGNOSIS — N181 Chronic kidney disease, stage 1: Secondary | ICD-10-CM | POA: Diagnosis not present

## 2023-11-06 DIAGNOSIS — J9610 Chronic respiratory failure, unspecified whether with hypoxia or hypercapnia: Secondary | ICD-10-CM | POA: Diagnosis not present

## 2023-11-18 ENCOUNTER — Telehealth (HOSPITAL_COMMUNITY): Payer: Self-pay

## 2023-11-18 NOTE — Telephone Encounter (Signed)
 No pre cert req for home sleep study.  No answer, Left message to return call.

## 2023-11-26 ENCOUNTER — Other Ambulatory Visit (HOSPITAL_BASED_OUTPATIENT_CLINIC_OR_DEPARTMENT_OTHER): Payer: Self-pay | Admitting: Pulmonary Disease

## 2023-12-02 DIAGNOSIS — K219 Gastro-esophageal reflux disease without esophagitis: Secondary | ICD-10-CM | POA: Diagnosis not present

## 2023-12-02 DIAGNOSIS — R634 Abnormal weight loss: Secondary | ICD-10-CM | POA: Diagnosis not present

## 2023-12-02 DIAGNOSIS — I10 Essential (primary) hypertension: Secondary | ICD-10-CM | POA: Diagnosis not present

## 2023-12-02 DIAGNOSIS — J449 Chronic obstructive pulmonary disease, unspecified: Secondary | ICD-10-CM | POA: Diagnosis not present

## 2023-12-02 DIAGNOSIS — E119 Type 2 diabetes mellitus without complications: Secondary | ICD-10-CM | POA: Diagnosis not present

## 2023-12-03 DIAGNOSIS — J441 Chronic obstructive pulmonary disease with (acute) exacerbation: Secondary | ICD-10-CM | POA: Diagnosis not present

## 2023-12-03 DIAGNOSIS — E1169 Type 2 diabetes mellitus with other specified complication: Secondary | ICD-10-CM | POA: Diagnosis not present

## 2023-12-03 DIAGNOSIS — S81802A Unspecified open wound, left lower leg, initial encounter: Secondary | ICD-10-CM | POA: Diagnosis not present

## 2023-12-03 DIAGNOSIS — E119 Type 2 diabetes mellitus without complications: Secondary | ICD-10-CM | POA: Diagnosis not present

## 2023-12-03 DIAGNOSIS — I10 Essential (primary) hypertension: Secondary | ICD-10-CM | POA: Diagnosis not present

## 2023-12-03 DIAGNOSIS — K219 Gastro-esophageal reflux disease without esophagitis: Secondary | ICD-10-CM | POA: Diagnosis not present

## 2023-12-03 DIAGNOSIS — R0902 Hypoxemia: Secondary | ICD-10-CM | POA: Diagnosis not present

## 2023-12-23 ENCOUNTER — Ambulatory Visit (HOSPITAL_BASED_OUTPATIENT_CLINIC_OR_DEPARTMENT_OTHER): Admitting: Pulmonary Disease

## 2023-12-26 ENCOUNTER — Encounter (HOSPITAL_BASED_OUTPATIENT_CLINIC_OR_DEPARTMENT_OTHER): Payer: Self-pay | Admitting: Pulmonary Disease

## 2023-12-26 ENCOUNTER — Ambulatory Visit (HOSPITAL_BASED_OUTPATIENT_CLINIC_OR_DEPARTMENT_OTHER): Admitting: Pulmonary Disease

## 2023-12-26 VITALS — BP 154/67 | HR 92 | Ht 63.0 in | Wt 122.0 lb

## 2023-12-26 DIAGNOSIS — F17210 Nicotine dependence, cigarettes, uncomplicated: Secondary | ICD-10-CM | POA: Diagnosis not present

## 2023-12-26 DIAGNOSIS — I2721 Secondary pulmonary arterial hypertension: Secondary | ICD-10-CM | POA: Diagnosis not present

## 2023-12-26 DIAGNOSIS — J9611 Chronic respiratory failure with hypoxia: Secondary | ICD-10-CM

## 2023-12-26 DIAGNOSIS — J449 Chronic obstructive pulmonary disease, unspecified: Secondary | ICD-10-CM | POA: Diagnosis not present

## 2023-12-26 DIAGNOSIS — I5032 Chronic diastolic (congestive) heart failure: Secondary | ICD-10-CM

## 2023-12-26 DIAGNOSIS — K766 Portal hypertension: Secondary | ICD-10-CM

## 2023-12-26 DIAGNOSIS — J3089 Other allergic rhinitis: Secondary | ICD-10-CM | POA: Diagnosis not present

## 2023-12-26 DIAGNOSIS — Z720 Tobacco use: Secondary | ICD-10-CM

## 2023-12-26 NOTE — Assessment & Plan Note (Addendum)
 Stable --CONTINUE Anoro ONE puff ONCE a day --CONTINUE albuterol  TWO puffs AS NEEDED --Due for vaccines including influenza, covid and RSV

## 2023-12-26 NOTE — Assessment & Plan Note (Signed)
 Decreased to 1 cigarette daily Continue to work on quitting

## 2023-12-26 NOTE — Progress Notes (Signed)
 Subjective:   PATIENT ID: Debbie Mullins GENDER: female DOB: [birth year], MRN: [id number]   HPI  Chief Complaint  Patient presents with   Follow-up   Reason for Visit: Follow-up  Debbie Mullins is a 68 year old female active smoker who presents for COPD follow-up.   04/07/21 At our last visit in July 2022, she was prescribed Anoro however ran out awhile ago. She reports using shortness of breath. Last COPD/CHF exacerbation required hospitalization in 04/2019. Denies cough or wheezing. No nocturnal symptoms. Denies any exacerbations since our last visit. She has been using her Albuterol  twice a day but this will make her jittery. She like Anoro because she did not have an issues with side effects. She is compliant with her oxygen . She uses her walker and ambulate from one end of the room to the other. She does sitting exercises. She has decreased from 1/2 pdd to 4 cigarettes daily.   07/03/21 Since our last visit she reports well-controlled symptoms on Anoro. Has not needed her albuterol  inhaler. Denies shortness of breath, wheezing or chronic cough. No exacerbations for >12 months. She limits her allergy exposure including staying at home during pollen season. Walks daily. Compliant with her oxygen . She is smoking 1/4 pack day  05/31/22 Since our last visit she has had ulcers and recent weight loss due to liquid diet. She is compliant Anoro. Uses albuterol  with activity as needed or with weather changes, on average once a week. Otherwise denies shortness of breath, cough or wheezing. Compliant oxygen . Checks oxygen  sometimes on room air and it 83%. She is smoking 2-3 cigarettes a day which is less than before. No exacerbations since our last visit.  12/19/22 She reports she is overall doing well. Compliant with Anoro. Denies shortness of breath cough or wheezing. Denies exacerbations. Has been checking oxygen  with lowest at 90%. Wearing oxygen  as needed. Still smoking 4-5 cigarettes  a day still.  06/18/23 Since our last visit she reports allergies is triggering her congestion and worsening shortness of breath. Cough is improved with robitussin. Currently on Anoro. Using Albuterol  3-4 times a day but improved to twice a day. Has not been compliant during the daytime. Wears at nightttime. She continues to smoke 2-3 cigarettes daily. Was recently seen in the ED last month for cellulitis and heart failure symptoms with LE swelling. Has improved since then with diuretics.  12/26/23 Since our last visit she reports she overall doing well since her hospitalization in June for diastolic heart failure. Discharged to SNF and did well. She had right heart cath demonstrate moderate PAH but has not been seen by heart failure team since her cath. She denies shortness of breath, cough or wheezing. Denies chest pain and leg swelling. Cut down to 1 cigarette daily. Reports allergies with sinus and ear congestion, no fevers or chills.  Social History: 41 pack-years. Currently smoking 1/4 ppd.  Past Medical History:  Diagnosis Date   Anemia    Asthma    CHF (congestive heart failure) (HCC)    takes Furosemide  daily    Chronic respiratory failure with hypoxia (HCC) 03/02/2019   COPD (chronic obstructive pulmonary disease) (HCC)    Albuterol  inhaler prn;SIngulair  at night   Depression    takes Wellbutrin  daily   Diabetes mellitus    takes Metformin  daily   GERD (gastroesophageal reflux disease)    takes Nexium  daily   History of colon polyps    benign   Hypertension    takes Lisinopril   daily   Iron  deficiency anemia 08/01/2017   Joint pain    Joint swelling    Leukocytosis 02/09/2011   Neck pain    HNP   Normocytic anemia 02/09/2011   Peripheral edema    takes Furosemide  daily   Pneumonia    many yrs ago   Pulmonary nodules/lesions, multiple 08/09/2013   Shortness of breath dyspnea    with exertion   Thrombocytosis 02/09/2011   Urinary frequency    Urinary urgency     Weakness    numbness and tingling in both hands     Not on File   Outpatient Medications Prior to Visit  Medication Sig Dispense Refill   acetaminophen  (TYLENOL ) 500 MG tablet Take 500-1,000 mg by mouth every 6 (six) hours as needed for mild pain (pain score 1-3).     albuterol  (VENTOLIN  HFA) 108 (90 Base) MCG/ACT inhaler Inhale 1-2 puffs into the lungs every 4 (four) hours as needed for wheezing or shortness of breath. 8 g 5   amLODipine  (NORVASC ) 10 MG tablet Take 10 mg by mouth at bedtime.     ANORO ELLIPTA  62.5-25 MCG/ACT AEPB USE 1 INHALATION BY MOUTH DAILY 180 each 3   aspirin  EC 325 MG tablet Take 1 tablet (325 mg total) by mouth every morning. (Patient taking differently: Take 325 mg by mouth every other day.) 30 tablet 0   atorvastatin  (LIPITOR) 10 MG tablet Take 10 mg by mouth every Monday, Wednesday, and Friday. In the morning     diclofenac  Sodium (VOLTAREN ) 1 % GEL Apply 1 Application topically 4 (four) times daily as needed (pain.).     esomeprazole  (NEXIUM ) 40 MG capsule Take 40 mg by mouth daily.     furosemide  (LASIX ) 40 MG tablet Take 1 tablet (40 mg total) by mouth daily. 30 tablet 1   Multiple Vitamin (MULTIVITAMIN WITH MINERALS) TABS tablet Take 1 tablet by mouth daily.     sucralfate  (CARAFATE ) 1 g tablet Take 1 tablet (1 g total) by mouth 4 (four) times daily -  with meals and at bedtime. 90 tablet 0   metFORMIN  (GLUCOPHAGE ) 500 MG tablet Take 500 mg by mouth in the morning and at bedtime. (Patient not taking: Reported on 12/26/2023)     No facility-administered medications prior to visit.    Review of Systems  Constitutional:  Negative for chills, diaphoresis, fever, malaise/fatigue and weight loss.  HENT:  Negative for congestion.   Respiratory:  Negative for cough, hemoptysis, sputum production, shortness of breath and wheezing.   Cardiovascular:  Negative for chest pain, palpitations and leg swelling.  Endo/Heme/Allergies:  Positive for environmental allergies.      Objective:   Vitals:   12/26/23 1509  BP: (!) 154/67  Pulse: 92  SpO2: 92%  Weight: 122 lb (55.3 kg)  Height: 5' 3 (1.6 m)   SpO2: 92 %  Physical Exam: General: Well-appearing, no acute distress HENT: [place], AT Eyes: EOMI, no scleral icterus Respiratory: Clear to auscultation bilaterally.  No crackles, wheezing or rales Cardiovascular: RRR, -M/R/G, no JVD Extremities:-Edema,-tenderness Neuro: AAO x4, CNII-XII grossly intact Psych: Normal mood, normal affect  Data Reviewed:  Imaging on my personal read: CT Chest 01/21/19 - Parenchyma with mosaic attenuation and reticulonodular pattern bilaterally. Stable benign pulmonary nodules  CTA 07/25/19 - No PE. Unchanged mosaic attenulation, mild interlobular septal thickening. Anasarca CT Chest lung screen 09/26/22 - multiple small lung nodules bilaterally with largest RLL 5.7 mm. Mild centrilobular and paraseptal emphysema CTA 05/17/23 - Neg for  PE. RML nodule 6 mm CT Chest Lung screen 10/07/23 - mild centrilobular emphysema, bronchial wall thickening. Scatted bilateral lung nodules with larges 5.3 mm, stable. 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 Asymptomatic on current regimen of inhalers and diuretics. No changes to current meds. Discussed clinical course and management of COPD including bronchodilator regimen, preventive care including vaccinations and action plan for exacerbation. Assessment & Plan Chronic respiratory failure with hypoxia (HCC) --Previously qualified for 4L O2 via [employer] with activity and sleep. Likely related in acute illness --Improved. Ambulatory O2 with no desaturations today --If SpO2 <88%, use 2L O2 with activity and sleep --Plan for re-certification in April 2026 Chronic obstructive pulmonary disease, unspecified COPD type (HCC) Stable --CONTINUE Anoro ONE  puff ONCE a day --CONTINUE albuterol  TWO puffs AS NEEDED --Due for vaccines including influenza, covid and RSV Allergic rhinitis due to other allergic trigger, unspecified seasonality Worsening --Recommend flonase 1 spray per nostril nightly. 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 Moderate PAH thought to be group 1 Procedure note reviewed and planned to start PDE5 inhibitor Will contact Cardiology team to schedule follow-up Chronic diastolic heart failure (HCC) Improved --Last hospitalization for exacerbation in June --CONTINUE lasix  40 mg daily  Health Maintenance Immunization History  Administered Date(s) Administered   Influenza Split 01/22/2016   Influenza,inj,Quad PF,6+ Mos 11/18/2014, 01/26/2018, 12/27/2018, 01/03/2020, 01/08/2021   Influenza,trivalent, recombinat, inj, PF 01/06/2017   Influenza-Unspecified 03/12/2014, 12/27/2021, 12/17/2022   Moderna Sars-Covid-2 Vaccination 04/25/2019, 05/10/2019, 06/13/2019   Pneumococcal Polysaccharide-23 08/12/2013   CT Lung Screen - Enrolled. Due 09/2024  No orders of the defined types were placed in this encounter.  No orders of the defined types were placed in this encounter.   Return in about 5 months (around 05/25/2024).  I have spent a total time of 33-minutes on the day of the appointment including chart review, data review, collecting history, coordinating care and discussing medical diagnosis and plan with the patient/family. Past medical history, allergies, medications were reviewed. Pertinent imaging, labs and tests included in this note have been reviewed and interpreted independently by me.  Zamarah Ullmer Slater Staff, MD [facility] 12/26/2023 4:08 PM

## 2023-12-26 NOTE — Patient Instructions (Signed)
 Chronic respiratory failure with hypoxia (HCC) --Previously qualified for 4L O2 via [place] with activity and sleep. Likely related in acute illness --Ambulatory O2 with no desaturations today --If SpO2 <88%, use 2L O2 with activity and sleep --Plan for re-certification in April 2026  Chronic obstructive pulmonary disease, unspecified COPD type (HCC) --CONTINUE Anoro ONE puff ONCE a day --CONTINUE albuterol  TWO puffs AS NEEDED --Due for vaccines including influenza, covid and RSV  Allergic rhinitis due to other allergic trigger, unspecified seasonality --Recommend flonase 1 spray per nostril nightly. 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 Moderate PAH thought to be group 1 Procedure note reviewed and planned to start PDE5 inhibitor Will contact Cardiology team to schedule follow-up  Chronic diastolic heart failure (HCC) --Last hospitalization for exacerbation in June --CONTINUE lasix  40 mg daily

## 2024-01-06 DIAGNOSIS — J9610 Chronic respiratory failure, unspecified whether with hypoxia or hypercapnia: Secondary | ICD-10-CM | POA: Diagnosis not present

## 2024-02-04 ENCOUNTER — Telehealth (HOSPITAL_COMMUNITY): Payer: Self-pay | Admitting: Cardiology

## 2024-02-04 NOTE — Telephone Encounter (Signed)
 lmom

## 2024-02-04 NOTE — Telephone Encounter (Signed)
-----   Message from Toribio Fuel sent at 02/01/2024  8:50 PM EST ----- Regarding: FW: PAH Need f/u appointment with me in next month or to for [facility]  Thanks ----- Message ----- From: Kassie Acquanetta Bradley, MD Sent: 12/26/2023   3:50 PM EST To: Toribio JONELLE Fuel, MD Subject: PAH                                            Hi Dan,  Patient had RHC with moderate PAH. She was supposed to be contacted by Heart Failure/cardiology for follow-up to start meds but had not heard anything. Could you ensure she has an appointment?  Thank you! Bradley

## 2024-02-05 NOTE — Telephone Encounter (Signed)
 Message sent to scheduling team to arrange f/u

## 2024-02-05 NOTE — Telephone Encounter (Signed)
 Appt scheduled

## 2024-02-13 ENCOUNTER — Other Ambulatory Visit: Payer: Self-pay | Admitting: Family Medicine

## 2024-02-13 DIAGNOSIS — Z1231 Encounter for screening mammogram for malignant neoplasm of breast: Secondary | ICD-10-CM

## 2024-03-11 ENCOUNTER — Ambulatory Visit

## 2024-03-16 ENCOUNTER — Other Ambulatory Visit (HOSPITAL_COMMUNITY): Payer: Self-pay

## 2024-03-16 ENCOUNTER — Encounter (HOSPITAL_COMMUNITY): Payer: Self-pay | Admitting: Internal Medicine

## 2024-03-16 ENCOUNTER — Ambulatory Visit (HOSPITAL_COMMUNITY)
Admission: RE | Admit: 2024-03-16 | Discharge: 2024-03-16 | Disposition: A | Discharge status: Home / Self Care | Source: Ambulatory Visit | Attending: Internal Medicine | Admitting: Internal Medicine

## 2024-03-16 VITALS — BP 150/70 | HR 77 | Wt 137.4 lb

## 2024-03-16 DIAGNOSIS — J4489 Other specified chronic obstructive pulmonary disease: Secondary | ICD-10-CM | POA: Diagnosis not present

## 2024-03-16 DIAGNOSIS — Z9981 Dependence on supplemental oxygen: Secondary | ICD-10-CM | POA: Diagnosis not present

## 2024-03-16 DIAGNOSIS — I110 Hypertensive heart disease with heart failure: Secondary | ICD-10-CM | POA: Insufficient documentation

## 2024-03-16 DIAGNOSIS — Z716 Tobacco abuse counseling: Secondary | ICD-10-CM | POA: Insufficient documentation

## 2024-03-16 DIAGNOSIS — J9611 Chronic respiratory failure with hypoxia: Secondary | ICD-10-CM | POA: Diagnosis not present

## 2024-03-16 DIAGNOSIS — E785 Hyperlipidemia, unspecified: Secondary | ICD-10-CM | POA: Diagnosis not present

## 2024-03-16 DIAGNOSIS — Z720 Tobacco use: Secondary | ICD-10-CM | POA: Diagnosis not present

## 2024-03-16 DIAGNOSIS — I272 Pulmonary hypertension, unspecified: Secondary | ICD-10-CM | POA: Insufficient documentation

## 2024-03-16 DIAGNOSIS — F17210 Nicotine dependence, cigarettes, uncomplicated: Secondary | ICD-10-CM | POA: Diagnosis not present

## 2024-03-16 DIAGNOSIS — I5032 Chronic diastolic (congestive) heart failure: Secondary | ICD-10-CM | POA: Diagnosis not present

## 2024-03-16 DIAGNOSIS — Z79899 Other long term (current) drug therapy: Secondary | ICD-10-CM | POA: Diagnosis not present

## 2024-03-16 MED ORDER — TADALAFIL (PAH) 20 MG PO TABS: 20.0000 mg | ORAL_TABLET | Freq: Every day | ORAL | 6 refills | Status: AC

## 2024-03-16 NOTE — Progress Notes (Addendum)
 "    HEART & VASCULAR TRANSITION OF CARE CONSULT NOTE     Referring Physician:  PCP: Benjamine Aland, MD  Cardiology: Dr Alveta Pulmopnary: Dr Kassie   Chief Complaint: Heart Failure   HPI: Referred to clinic by Dr Franchot for heart failure consultation.   Debbie Mullins is a 69 y.o. female with a history of HFpEF, PAH, COPD, Chronnic Oxygen , HTN, tobacco abuse, and HLD.   Former patient of Dr Alveta. Diagnosed with PAH in 2013. RHC - PVR 8. Echo EF preserved, RVSP 60.   Follow by Pulmonary for COPD/PAH. She was seen earlier this year. Recommendations to wear oxygen  more.   Admitted 08/12/23 to [facility] with A/C HFpEF. Diuresed with IV lasix . Echo EF 60-65% RV severely reduced, RVSP 100, and mod TR. Discharged with SNF on 08/19/2023.   RHC 7/25  RA 1 PA 58/22 (37) PCW 1 Thermo 4.4/2.8 PVR 8.8WU  Here for routine f/u. Now back home. Living with her cousin. Says she feels fine. Can do all ADLs without problem but does get SOB f she does too much. Edema well controlled with lasix . Still smoking 1/2 ppd. No syncope. Occasional lightheadedness. Wears O2 at night and when she does housework. (Not wearing it here)     Past Medical History:  Diagnosis Date   Anemia    Asthma    CHF (congestive heart failure) (HCC)    takes Furosemide  daily    Chronic respiratory failure with hypoxia (HCC) 03/02/2019   COPD (chronic obstructive pulmonary disease) (HCC)    Albuterol  inhaler prn;SIngulair  at night   Depression    takes Wellbutrin  daily   Diabetes mellitus    takes Metformin  daily   GERD (gastroesophageal reflux disease)    takes Nexium  daily   History of colon polyps    benign   Hypertension    takes Lisinopril  daily   Iron  deficiency anemia 08/01/2017   Joint pain    Joint swelling    Leukocytosis 02/09/2011   Neck pain    HNP   Normocytic anemia 02/09/2011   Peripheral edema    takes Furosemide  daily   Pneumonia    many yrs ago   Pulmonary nodules/lesions, multiple  08/09/2013   Shortness of breath dyspnea    with exertion   Thrombocytosis 02/09/2011   Urinary frequency    Urinary urgency    Weakness    numbness and tingling in both hands    Current Outpatient Medications  Medication Sig Dispense Refill   acetaminophen  (TYLENOL ) 500 MG tablet Take 500-1,000 mg by mouth every 6 (six) hours as needed for mild pain (pain score 1-3).     albuterol  (VENTOLIN  HFA) 108 (90 Base) MCG/ACT inhaler Inhale 1-2 puffs into the lungs every 4 (four) hours as needed for wheezing or shortness of breath. 8 g 5   amLODipine  (NORVASC ) 10 MG tablet Take 10 mg by mouth at bedtime.     ANORO ELLIPTA  62.5-25 MCG/ACT AEPB USE 1 INHALATION BY MOUTH DAILY 180 each 3   atorvastatin  (LIPITOR) 10 MG tablet Take 10 mg by mouth every Monday, Wednesday, and Friday. In the morning     diclofenac  Sodium (VOLTAREN ) 1 % GEL Apply 1 Application topically 4 (four) times daily as needed (pain.).     esomeprazole  (NEXIUM ) 40 MG capsule Take 40 mg by mouth daily.     furosemide  (LASIX ) 40 MG tablet Take 1 tablet (40 mg total) by mouth daily. 30 tablet 1   Multiple Vitamin (MULTIVITAMIN WITH  MINERALS) TABS tablet Take 1 tablet by mouth daily.     sucralfate  (CARAFATE ) 1 g tablet Take 1 tablet (1 g total) by mouth 4 (four) times daily -  with meals and at bedtime. 90 tablet 0   No current facility-administered medications for this encounter.    Not on File    Social History   Socioeconomic History   Marital status: Single    Spouse name: Not on file   Number of children: 0   Years of education: Not on file   Highest education level: 12th grade  Occupational History   Not on file  Tobacco Use   Smoking status: Every Day    Current packs/day: 0.50    Average packs/day: 0.7 packs/day for 91.0 years (66.0 ttl pk-yrs)    Types: Cigarettes    Start date: 37   Smokeless tobacco: Never   Tobacco comments:    Pt states she is smoking less than 0.25 a day now    pt currently trying  to quit with gum/losenges  Vaping Use   Vaping status: Never Used  Substance and Sexual Activity   Alcohol use: Yes    Comment: occ-mixed drinks   Drug use: No   Sexual activity: Never  Other Topics Concern   Not on file  Social History Narrative   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 COUSINS TRANSPORTS HER.   Social Drivers of Health   Tobacco Use: High Risk (03/16/2024)   Patient History    Smoking Tobacco Use: Every Day    Smokeless Tobacco Use: Never    Passive Exposure: Not on file  Financial Resource Strain: Not on file  Food Insecurity: No Food Insecurity (08/15/2023)   Hunger Vital Sign    Worried About Running Out of Food in the Last Year: Never true    Ran Out of Food in the Last Year: Never true  Transportation Needs: No Transportation Needs (08/15/2023)   PRAPARE - Administrator, Civil Service (Medical): No    Lack of Transportation (Non-Medical): No  Physical Activity: Not on file  Stress: Not on file  Social Connections: Unknown (08/12/2023)   Social Connection and Isolation Panel    Frequency of Communication with Friends and Family: More than three times a week    Frequency of Social Gatherings with Friends and Family: Three times a week    Attends Religious Services: More than 4 times per year    Active Member of Clubs or Organizations: Yes    Attends Banker Meetings: 1 to 4 times per year    Marital Status: Patient declined  Intimate Partner Violence: Not At Risk (08/12/2023)   Humiliation, Afraid, Rape, and Kick questionnaire    Fear of Current or Ex-Partner: No    Emotionally Abused: No    Physically Abused: No    Sexually Abused: No  Depression (PHQ2-9): Not on file  Alcohol Screen: Not on file  Housing: Low Risk (08/15/2023)   Housing Stability Vital Sign    Unable to Pay for Housing in the Last Year: No    Number of Times Moved in the Last Year: 0    Homeless in the Last Year: No  Utilities: Not At  Risk (08/12/2023)   [facility] Utilities    Threatened with loss of utilities: No  Health Literacy: Not on file      Family History  Problem Relation Age of Onset   Heart failure Mother  Cancer Maternal Grandmother    Colon cancer Neg Hx    Colon polyps Neg Hx    BRCA 1/2 Neg Hx    Breast cancer Neg Hx     Vitals:   03/16/24 1128  BP: (!) 150/70  Pulse: 77  SpO2: 97%  Weight: 62.3 kg (137 lb 6.4 oz)   Wt Readings from Last 3 Encounters:  03/16/24 62.3 kg (137 lb 6.4 oz)  12/26/23 55.3 kg (122 lb)  09/26/23 56.2 kg (124 lb)    PHYSICAL EXAM: General:  Elderly frail woman No resp difficulty HEENT: normal Neck: supple. no JVD.  Cor: Regular rate & rhythm. 2/6 TR Lungs: decreased throughout Abdomen: soft, nontender, nondistended.Good bowel sounds. Extremities: no cyanosis, clubbing, rash, edema Neuro: alert & orientedx3, cranial nerves grossly intact. moves all 4 extremities w/o difficulty. Affect pleasant   ASSESSMENT & PLAN:  1. Chronic HFpE, RV Failure  - Echo 6/25 EF 60-65% D-septum RV severely reduced RVSP - NYHA III. Appears euvolemic. Continue lasix  40 mg daily  - Failed Jardiance  and spiro. She does not want to rechallenge at this point.   2. PAH - Diagnosed 2013. PVR at that time was 8. PAH was felt to be WHO Group III in the setting of COPD + Chronic Hypoxic Respiratory Failure.  - 2025 CTA- negative PE. Midl COPD . HIV NR.   - Echo 6/25 EF 60-65% D-septum RV severely reduced RVSP - RHC 7/25  RA 1 PA 58/22 (37) PCW 1 Thermo 4.4/2.8 PVR 8.8WU - Auto-immune and CTD serologies negative 7/25  - Sleep study ordered but not completed  - Suspect combination of WHO Group I and III - Refer for PFTs with DLCO  - Start tadalafil  20 daily  - check hall walk     3. Tobacco Abuse  - smoking 1/2 PPD - discussed need for cessation  Addendum:  hallway walk O2 sat started at 97% on room air and ended with O2 sat at 92 %  Rochanda Harpham, MD  11:38  AM   "

## 2024-03-16 NOTE — Patient Instructions (Signed)
 Medication Changes:  START Tadalafil  20 mg daily   Testing/Procedures:  Your physician has recommended that you have a pulmonary function test. Pulmonary Function Tests are a group of tests that measure how well air moves in and out of your lungs.  YOU WILL BE CALLED TO SCHEDULE THIS   Special Instructions // Education:  Do the following things EVERYDAY: Weigh yourself in the morning before breakfast. Write it down and keep it in a log. Take your medicines as prescribed Eat low salt foods--Limit salt (sodium) to 2000 mg per day.  Stay as active as you can everyday Limit all fluids for the day to less than 2 liters   Follow-Up in: 3 MONTHS    At the [facility], you and your health needs are our priority. We have a designated team specialized in the treatment of Heart Failure. This Care Team includes your primary Heart Failure Specialized Cardiologist (physician), Advanced Practice Providers (APPs- Physician Assistants and Nurse Practitioners), and Pharmacist who all work together to provide you with the care you need, when you need it.   You may see any of the following providers on your designated Care Team at your next follow up:  Dr. Toribio Fuel Dr. Ezra Shuck Dr. Odis Brownie Greig Mosses, NP Caffie Shed, [place] [facility] , [place] Beckey Coe, NP Jordan Lee, NP Tinnie Redman, PharmD   Please be sure to bring in all your medications bottles to every appointment.   Need to Contact Us :  If you have any questions or concerns before your next appointment please send us  a message through [place] or call our office at [phone].    TO LEAVE A MESSAGE FOR THE NURSE SELECT OPTION 2, PLEASE LEAVE A MESSAGE INCLUDING: YOUR NAME DATE OF BIRTH CALL BACK NUMBER REASON FOR CALL**this is important as we prioritize the call backs  YOU WILL RECEIVE A CALL BACK THE SAME DAY AS LONG AS YOU CALL BEFORE 4:00 PM

## 2024-03-16 NOTE — Progress Notes (Signed)
 Pt did a hallway walk O2 sat started at 97% on room air and ended with O2 sat at 92 %

## 2024-03-17 ENCOUNTER — Ambulatory Visit
Admission: RE | Admit: 2024-03-17 | Discharge: 2024-03-17 | Disposition: A | Source: Ambulatory Visit | Attending: Family Medicine | Admitting: Family Medicine

## 2024-03-17 DIAGNOSIS — Z1231 Encounter for screening mammogram for malignant neoplasm of breast: Secondary | ICD-10-CM

## 2024-03-23 ENCOUNTER — Encounter (HOSPITAL_COMMUNITY): Payer: Self-pay | Admitting: Internal Medicine

## 2024-03-23 ENCOUNTER — Telehealth (HOSPITAL_COMMUNITY): Payer: Self-pay

## 2024-03-23 ENCOUNTER — Other Ambulatory Visit (HOSPITAL_COMMUNITY): Payer: Self-pay

## 2024-03-23 NOTE — Telephone Encounter (Signed)
 Advanced Heart Failure Patient Advocate Encounter  Prior authorization for Tadalafil  [facility]) has been submitted and approved. Test billing returns $5.10 for 30 day supply. This plan limits 30 day supply.  Key: [id number] Effective: 03/23/2024 to 03/11/2025  Rachel DEL, CPhT Rx Patient Advocate Phone: [phone]

## 2024-04-07 ENCOUNTER — Encounter (HOSPITAL_COMMUNITY)

## 2024-04-10 ENCOUNTER — Encounter (HOSPITAL_COMMUNITY)

## 2024-04-17 ENCOUNTER — Encounter (HOSPITAL_COMMUNITY)

## 2024-04-24 ENCOUNTER — Encounter (HOSPITAL_COMMUNITY)

## 2024-05-18 ENCOUNTER — Ambulatory Visit (HOSPITAL_BASED_OUTPATIENT_CLINIC_OR_DEPARTMENT_OTHER): Admitting: Pulmonary Disease

## 2024-06-08 ENCOUNTER — Ambulatory Visit (HOSPITAL_COMMUNITY): Admitting: Internal Medicine
